# Patient Record
Sex: Female | Born: 1940 | Race: White | Hispanic: No | Marital: Married | Smoking: Never smoker
Health system: Southern US, Community
[De-identification: ages and names within clinical notes are randomized; demographics above are authoritative.]

## PROBLEM LIST (undated history)

## (undated) DIAGNOSIS — I6529 Occlusion and stenosis of unspecified carotid artery: Secondary | ICD-10-CM

## (undated) DIAGNOSIS — S62109A Fracture of unspecified carpal bone, unspecified wrist, initial encounter for closed fracture: Secondary | ICD-10-CM

## (undated) DIAGNOSIS — R001 Bradycardia, unspecified: Secondary | ICD-10-CM

## (undated) DIAGNOSIS — Z8679 Personal history of other diseases of the circulatory system: Secondary | ICD-10-CM

## (undated) DIAGNOSIS — N189 Chronic kidney disease, unspecified: Secondary | ICD-10-CM

## (undated) DIAGNOSIS — S82899A Other fracture of unspecified lower leg, initial encounter for closed fracture: Secondary | ICD-10-CM

## (undated) DIAGNOSIS — K2970 Gastritis, unspecified, without bleeding: Secondary | ICD-10-CM

## (undated) DIAGNOSIS — I639 Cerebral infarction, unspecified: Secondary | ICD-10-CM

## (undated) DIAGNOSIS — K219 Gastro-esophageal reflux disease without esophagitis: Secondary | ICD-10-CM

## (undated) DIAGNOSIS — R0989 Other specified symptoms and signs involving the circulatory and respiratory systems: Secondary | ICD-10-CM

## (undated) DIAGNOSIS — G629 Polyneuropathy, unspecified: Secondary | ICD-10-CM

## (undated) DIAGNOSIS — I2699 Other pulmonary embolism without acute cor pulmonale: Secondary | ICD-10-CM

## (undated) DIAGNOSIS — Z9225 Personal history of immunosupression therapy: Secondary | ICD-10-CM

## (undated) DIAGNOSIS — S02402A Zygomatic fracture, unspecified, initial encounter for closed fracture: Secondary | ICD-10-CM

## (undated) DIAGNOSIS — K529 Noninfective gastroenteritis and colitis, unspecified: Secondary | ICD-10-CM

## (undated) DIAGNOSIS — E785 Hyperlipidemia, unspecified: Secondary | ICD-10-CM

## (undated) DIAGNOSIS — S32040A Wedge compression fracture of fourth lumbar vertebra, initial encounter for closed fracture: Secondary | ICD-10-CM

## (undated) DIAGNOSIS — I4891 Unspecified atrial fibrillation: Secondary | ICD-10-CM

## (undated) DIAGNOSIS — D369 Benign neoplasm, unspecified site: Secondary | ICD-10-CM

## (undated) DIAGNOSIS — L97909 Non-pressure chronic ulcer of unspecified part of unspecified lower leg with unspecified severity: Secondary | ICD-10-CM

## (undated) DIAGNOSIS — S069X9A Unspecified intracranial injury with loss of consciousness of unspecified duration, initial encounter: Secondary | ICD-10-CM

## (undated) DIAGNOSIS — B3781 Candidal esophagitis: Secondary | ICD-10-CM

## (undated) DIAGNOSIS — K589 Irritable bowel syndrome without diarrhea: Secondary | ICD-10-CM

## (undated) DIAGNOSIS — N289 Disorder of kidney and ureter, unspecified: Secondary | ICD-10-CM

## (undated) DIAGNOSIS — N250 Renal osteodystrophy: Secondary | ICD-10-CM

## (undated) DIAGNOSIS — E039 Hypothyroidism, unspecified: Secondary | ICD-10-CM

## (undated) DIAGNOSIS — I2510 Atherosclerotic heart disease of native coronary artery without angina pectoris: Secondary | ICD-10-CM

## (undated) DIAGNOSIS — J189 Pneumonia, unspecified organism: Secondary | ICD-10-CM

## (undated) DIAGNOSIS — K559 Vascular disorder of intestine, unspecified: Secondary | ICD-10-CM

## (undated) DIAGNOSIS — I739 Peripheral vascular disease, unspecified: Secondary | ICD-10-CM

## (undated) DIAGNOSIS — M792 Neuralgia and neuritis, unspecified: Secondary | ICD-10-CM

## (undated) DIAGNOSIS — C439 Malignant melanoma of skin, unspecified: Secondary | ICD-10-CM

## (undated) DIAGNOSIS — K862 Cyst of pancreas: Secondary | ICD-10-CM

## (undated) DIAGNOSIS — K551 Chronic vascular disorders of intestine: Principal | ICD-10-CM

## (undated) DIAGNOSIS — S0240CA Maxillary fracture, right side, initial encounter for closed fracture: Secondary | ICD-10-CM

## (undated) DIAGNOSIS — I701 Atherosclerosis of renal artery: Secondary | ICD-10-CM

## (undated) DIAGNOSIS — E739 Lactose intolerance, unspecified: Secondary | ICD-10-CM

## (undated) DIAGNOSIS — J111 Influenza due to unidentified influenza virus with other respiratory manifestations: Secondary | ICD-10-CM

## (undated) DIAGNOSIS — D649 Anemia, unspecified: Secondary | ICD-10-CM

## (undated) DIAGNOSIS — S0285XA Fracture of orbit, unspecified, initial encounter for closed fracture: Secondary | ICD-10-CM

## (undated) HISTORY — PX: THYROID SURGERY: SHX805

## (undated) HISTORY — DX: Other specified symptoms and signs involving the circulatory and respiratory systems: R09.89

## (undated) HISTORY — DX: Anemia, unspecified: D64.9

## (undated) HISTORY — DX: Benign neoplasm, unspecified site: D36.9

## (undated) HISTORY — DX: Neuralgia and neuritis, unspecified: M79.2

## (undated) HISTORY — DX: Zygomatic fracture, unspecified side, initial encounter for closed fracture: S02.402A

## (undated) HISTORY — DX: Maxillary fracture, right side, initial encounter for closed fracture: S02.40CA

## (undated) HISTORY — DX: Fracture of unspecified carpal bone, unspecified wrist, initial encounter for closed fracture: S62.109A

## (undated) HISTORY — PX: CHOLECYSTECTOMY: SHX55

## (undated) HISTORY — DX: Hypothyroidism, unspecified: E03.9

## (undated) HISTORY — DX: Polyneuropathy, unspecified: G62.9

## (undated) HISTORY — DX: Malignant melanoma of skin, unspecified: C43.9

## (undated) HISTORY — DX: Peripheral vascular disease, unspecified: I73.9

## (undated) HISTORY — DX: Vascular disorder of intestine, unspecified: K55.9

## (undated) HISTORY — DX: Cerebral infarction, unspecified: I63.9

## (undated) HISTORY — DX: Non-pressure chronic ulcer of unspecified part of unspecified lower leg with unspecified severity: L97.909

## (undated) HISTORY — PX: VITRECTOMY: SHX106

## (undated) HISTORY — DX: Unspecified intracranial injury with loss of consciousness of unspecified duration, initial encounter: S06.9X9A

## (undated) HISTORY — DX: Gastro-esophageal reflux disease without esophagitis: K21.9

## (undated) HISTORY — DX: Chronic kidney disease, unspecified: N18.9

## (undated) HISTORY — PX: APPENDECTOMY: SHX54

## (undated) HISTORY — DX: Lactose intolerance, unspecified: E73.9

## (undated) HISTORY — DX: Renal osteodystrophy: N25.0

## (undated) HISTORY — PX: TONSILLECTOMY: SUR1361

## (undated) HISTORY — PX: THORACENTESIS: SHX235

## (undated) HISTORY — DX: Cyst of pancreas: K86.2

## (undated) HISTORY — DX: Disorder of kidney and ureter, unspecified: N28.9

## (undated) HISTORY — DX: Occlusion and stenosis of unspecified carotid artery: I65.29

## (undated) HISTORY — PX: CATARACT EXTRACTION: SUR2

## (undated) HISTORY — DX: Fracture of orbit, unspecified, initial encounter for closed fracture: S02.85XA

## (undated) HISTORY — DX: Atherosclerosis of renal artery: I70.1

## (undated) HISTORY — DX: Gastritis, unspecified, without bleeding: K29.70

## (undated) HISTORY — PX: ABDOMINAL HYSTERECTOMY: SHX81

## (undated) HISTORY — DX: Bradycardia, unspecified: R00.1

## (undated) HISTORY — PX: CORONARY ARTERY BYPASS GRAFT: SHX141

## (undated) HISTORY — PX: EYE SURGERY: SHX253

## (undated) HISTORY — DX: Personal history of immunosuppression therapy: Z92.25

## (undated) HISTORY — DX: Chronic vascular disorders of intestine: K55.1

## (undated) HISTORY — PX: BELOW KNEE LEG AMPUTATION: SUR23

## (undated) HISTORY — DX: Wedge compression fracture of fourth lumbar vertebra, initial encounter for closed fracture: S32.040A

## (undated) HISTORY — DX: Hyperlipidemia, unspecified: E78.5

## (undated) HISTORY — DX: Noninfective gastroenteritis and colitis, unspecified: K52.9

## (undated) HISTORY — DX: Atherosclerotic heart disease of native coronary artery without angina pectoris: I25.10

## (undated) HISTORY — DX: Personal history of other diseases of the circulatory system: Z86.79

## (undated) HISTORY — DX: Candidal esophagitis: B37.81

## (undated) HISTORY — DX: Irritable bowel syndrome, unspecified: K58.9

## (undated) HISTORY — DX: Other fracture of unspecified lower leg, initial encounter for closed fracture: S82.899A

## (undated) HISTORY — DX: Other pulmonary embolism without acute cor pulmonale: I26.99

---

## 2000-08-15 HISTORY — PX: KIDNEY TRANSPLANT: SHX239

## 2001-07-09 ENCOUNTER — Encounter: Payer: Self-pay | Admitting: Internal Medicine

## 2004-06-28 ENCOUNTER — Encounter: Payer: Self-pay | Admitting: Internal Medicine

## 2004-07-29 ENCOUNTER — Encounter: Payer: Self-pay | Admitting: Internal Medicine

## 2007-03-14 ENCOUNTER — Emergency Department (HOSPITAL_COMMUNITY): Admission: EM | Admit: 2007-03-14 | Discharge: 2007-03-14 | Payer: Self-pay | Admitting: Emergency Medicine

## 2008-02-11 ENCOUNTER — Ambulatory Visit: Payer: Self-pay | Admitting: Internal Medicine

## 2008-02-26 ENCOUNTER — Encounter: Payer: Self-pay | Admitting: Internal Medicine

## 2008-03-31 ENCOUNTER — Encounter: Payer: Self-pay | Admitting: Internal Medicine

## 2008-07-03 ENCOUNTER — Encounter: Payer: Self-pay | Admitting: Internal Medicine

## 2008-07-10 ENCOUNTER — Encounter: Payer: Self-pay | Admitting: Internal Medicine

## 2008-07-16 ENCOUNTER — Ambulatory Visit: Payer: Self-pay

## 2008-07-16 ENCOUNTER — Encounter: Payer: Self-pay | Admitting: Internal Medicine

## 2008-07-24 ENCOUNTER — Ambulatory Visit: Payer: Self-pay | Admitting: Internal Medicine

## 2008-08-18 ENCOUNTER — Ambulatory Visit: Payer: Self-pay | Admitting: Internal Medicine

## 2008-08-18 DIAGNOSIS — K589 Irritable bowel syndrome without diarrhea: Secondary | ICD-10-CM | POA: Insufficient documentation

## 2008-08-18 DIAGNOSIS — Z860100 Personal history of colon polyps, unspecified: Secondary | ICD-10-CM | POA: Insufficient documentation

## 2008-08-18 DIAGNOSIS — D136 Benign neoplasm of pancreas: Secondary | ICD-10-CM

## 2008-08-19 ENCOUNTER — Telehealth: Payer: Self-pay | Admitting: Internal Medicine

## 2008-09-04 ENCOUNTER — Encounter: Payer: Self-pay | Admitting: Internal Medicine

## 2008-09-11 ENCOUNTER — Ambulatory Visit: Payer: Self-pay | Admitting: Internal Medicine

## 2008-10-14 ENCOUNTER — Encounter: Payer: Self-pay | Admitting: Internal Medicine

## 2008-10-29 ENCOUNTER — Encounter: Payer: Medicare Other | Admitting: Internal Medicine

## 2008-11-14 ENCOUNTER — Encounter: Payer: Self-pay | Admitting: Internal Medicine

## 2008-11-20 ENCOUNTER — Encounter: Payer: Medicare Other | Admitting: Internal Medicine

## 2008-11-25 ENCOUNTER — Encounter (INDEPENDENT_AMBULATORY_CARE_PROVIDER_SITE_OTHER): Payer: Self-pay | Admitting: *Deleted

## 2008-12-20 ENCOUNTER — Encounter: Payer: Medicare Other | Admitting: Internal Medicine

## 2009-01-06 ENCOUNTER — Encounter: Payer: Self-pay | Admitting: Internal Medicine

## 2009-01-09 ENCOUNTER — Encounter: Payer: Self-pay | Admitting: Internal Medicine

## 2009-01-14 ENCOUNTER — Encounter: Payer: Self-pay | Admitting: Internal Medicine

## 2009-01-20 ENCOUNTER — Encounter: Payer: Medicare Other | Admitting: Internal Medicine

## 2009-02-09 ENCOUNTER — Ambulatory Visit: Payer: Self-pay | Admitting: Internal Medicine

## 2009-02-09 ENCOUNTER — Encounter: Payer: Self-pay | Admitting: Internal Medicine

## 2009-02-09 DIAGNOSIS — I10 Essential (primary) hypertension: Secondary | ICD-10-CM

## 2009-02-09 DIAGNOSIS — R0989 Other specified symptoms and signs involving the circulatory and respiratory systems: Secondary | ICD-10-CM

## 2009-02-09 DIAGNOSIS — I2510 Atherosclerotic heart disease of native coronary artery without angina pectoris: Secondary | ICD-10-CM

## 2009-02-11 ENCOUNTER — Ambulatory Visit: Payer: Self-pay | Admitting: Internal Medicine

## 2009-02-11 DIAGNOSIS — E1122 Type 2 diabetes mellitus with diabetic chronic kidney disease: Secondary | ICD-10-CM | POA: Insufficient documentation

## 2009-02-11 DIAGNOSIS — N184 Chronic kidney disease, stage 4 (severe): Secondary | ICD-10-CM

## 2009-02-18 ENCOUNTER — Ambulatory Visit: Payer: Self-pay | Admitting: Internal Medicine

## 2009-02-18 ENCOUNTER — Encounter: Payer: Self-pay | Admitting: Internal Medicine

## 2009-02-18 HISTORY — PX: COLONOSCOPY W/ BIOPSIES AND POLYPECTOMY: SHX1376

## 2009-02-19 ENCOUNTER — Encounter: Payer: Self-pay | Admitting: Internal Medicine

## 2009-03-31 ENCOUNTER — Encounter: Payer: Self-pay | Admitting: Internal Medicine

## 2009-04-01 ENCOUNTER — Encounter: Payer: Self-pay | Admitting: Internal Medicine

## 2009-04-07 ENCOUNTER — Encounter: Payer: Self-pay | Admitting: Internal Medicine

## 2009-04-07 ENCOUNTER — Ambulatory Visit: Payer: Self-pay

## 2009-04-08 ENCOUNTER — Encounter: Payer: Self-pay | Admitting: Internal Medicine

## 2009-05-20 ENCOUNTER — Ambulatory Visit: Payer: Self-pay | Admitting: Internal Medicine

## 2009-05-20 DIAGNOSIS — R42 Dizziness and giddiness: Secondary | ICD-10-CM

## 2009-07-29 ENCOUNTER — Encounter: Payer: Self-pay | Admitting: Cardiovascular Disease

## 2009-07-29 ENCOUNTER — Ambulatory Visit: Payer: Self-pay | Admitting: Internal Medicine

## 2009-07-29 DIAGNOSIS — R55 Syncope and collapse: Secondary | ICD-10-CM | POA: Insufficient documentation

## 2009-07-29 LAB — CONVERTED CEMR LAB
ALT: 30 units/L
AST: 24 units/L
Alkaline Phosphatase: 62 units/L
CO2: 29 meq/L
GFR calc non Af Amer: >59 mL/min
Glomerular Filtration Rate, Af Am: >59 mL/min/{1.73_m2}
Sodium: 141 meq/L

## 2009-08-03 ENCOUNTER — Encounter: Payer: Self-pay | Admitting: Internal Medicine

## 2009-08-09 ENCOUNTER — Encounter: Payer: Self-pay | Admitting: Internal Medicine

## 2009-08-12 ENCOUNTER — Encounter (INDEPENDENT_AMBULATORY_CARE_PROVIDER_SITE_OTHER): Payer: Self-pay | Admitting: *Deleted

## 2009-08-25 ENCOUNTER — Telehealth: Payer: Self-pay | Admitting: Internal Medicine

## 2009-09-09 ENCOUNTER — Ambulatory Visit: Payer: Self-pay | Admitting: Internal Medicine

## 2009-09-16 ENCOUNTER — Telehealth: Payer: Self-pay | Admitting: Internal Medicine

## 2009-09-25 ENCOUNTER — Encounter: Payer: Self-pay | Admitting: Cardiovascular Disease

## 2009-10-01 ENCOUNTER — Encounter: Payer: Self-pay | Admitting: Internal Medicine

## 2009-11-24 ENCOUNTER — Encounter: Payer: Self-pay | Admitting: Internal Medicine

## 2009-11-25 ENCOUNTER — Encounter: Payer: Self-pay | Admitting: Internal Medicine

## 2009-11-25 ENCOUNTER — Telehealth: Payer: Self-pay | Admitting: Internal Medicine

## 2009-11-30 ENCOUNTER — Ambulatory Visit: Payer: Self-pay | Admitting: Internal Medicine

## 2009-11-30 ENCOUNTER — Encounter (INDEPENDENT_AMBULATORY_CARE_PROVIDER_SITE_OTHER): Payer: Self-pay | Admitting: *Deleted

## 2009-12-01 ENCOUNTER — Ambulatory Visit: Payer: Self-pay | Admitting: Internal Medicine

## 2009-12-01 HISTORY — PX: UPPER GASTROINTESTINAL ENDOSCOPY: SHX188

## 2009-12-08 ENCOUNTER — Telehealth: Payer: Self-pay | Admitting: Internal Medicine

## 2009-12-08 DIAGNOSIS — K219 Gastro-esophageal reflux disease without esophagitis: Secondary | ICD-10-CM | POA: Insufficient documentation

## 2009-12-10 ENCOUNTER — Telehealth: Payer: Self-pay | Admitting: Internal Medicine

## 2009-12-14 ENCOUNTER — Ambulatory Visit: Payer: Self-pay | Admitting: Internal Medicine

## 2009-12-14 DIAGNOSIS — I495 Sick sinus syndrome: Secondary | ICD-10-CM

## 2009-12-15 ENCOUNTER — Ambulatory Visit (HOSPITAL_COMMUNITY): Admission: RE | Admit: 2009-12-15 | Discharge: 2009-12-15 | Payer: Self-pay | Admitting: Internal Medicine

## 2009-12-30 ENCOUNTER — Telehealth: Payer: Self-pay | Admitting: Internal Medicine

## 2010-01-14 ENCOUNTER — Ambulatory Visit: Payer: Self-pay | Admitting: Internal Medicine

## 2010-01-15 ENCOUNTER — Telehealth: Payer: Self-pay | Admitting: Internal Medicine

## 2010-01-25 ENCOUNTER — Telehealth (INDEPENDENT_AMBULATORY_CARE_PROVIDER_SITE_OTHER): Payer: Self-pay | Admitting: *Deleted

## 2010-01-25 ENCOUNTER — Encounter (INDEPENDENT_AMBULATORY_CARE_PROVIDER_SITE_OTHER): Payer: Self-pay | Admitting: *Deleted

## 2010-01-26 ENCOUNTER — Encounter: Payer: Self-pay | Admitting: Internal Medicine

## 2010-02-01 ENCOUNTER — Telehealth: Payer: Self-pay | Admitting: Gastroenterology

## 2010-02-04 ENCOUNTER — Ambulatory Visit: Payer: Self-pay | Admitting: Gastroenterology

## 2010-02-04 ENCOUNTER — Ambulatory Visit (HOSPITAL_COMMUNITY): Admission: RE | Admit: 2010-02-04 | Discharge: 2010-02-04 | Payer: Self-pay | Admitting: Gastroenterology

## 2010-02-04 HISTORY — PX: EUS: SHX5427

## 2010-02-05 ENCOUNTER — Encounter: Payer: Self-pay | Admitting: Gastroenterology

## 2010-02-05 ENCOUNTER — Telehealth: Payer: Self-pay | Admitting: Gastroenterology

## 2010-02-16 ENCOUNTER — Telehealth: Payer: Self-pay | Admitting: Internal Medicine

## 2010-02-18 ENCOUNTER — Ambulatory Visit: Payer: Self-pay | Admitting: Internal Medicine

## 2010-02-23 ENCOUNTER — Telehealth: Payer: Self-pay | Admitting: Internal Medicine

## 2010-03-29 ENCOUNTER — Encounter: Payer: Self-pay | Admitting: Internal Medicine

## 2010-03-31 ENCOUNTER — Encounter: Payer: Self-pay | Admitting: Internal Medicine

## 2010-04-01 ENCOUNTER — Telehealth: Payer: Self-pay | Admitting: Internal Medicine

## 2010-04-08 ENCOUNTER — Encounter: Payer: Self-pay | Admitting: Internal Medicine

## 2010-04-15 ENCOUNTER — Ambulatory Visit: Payer: Self-pay | Admitting: Family Medicine

## 2010-05-24 ENCOUNTER — Ambulatory Visit: Payer: Self-pay | Admitting: Gastroenterology

## 2010-05-24 ENCOUNTER — Inpatient Hospital Stay (HOSPITAL_COMMUNITY): Admission: AD | Admit: 2010-05-24 | Discharge: 2010-05-27 | Payer: Self-pay | Admitting: Nephrology

## 2010-05-24 ENCOUNTER — Ambulatory Visit: Payer: Self-pay | Admitting: Cardiovascular Disease

## 2010-05-26 ENCOUNTER — Encounter: Payer: Self-pay | Admitting: Internal Medicine

## 2010-05-26 ENCOUNTER — Telehealth: Payer: Self-pay | Admitting: Internal Medicine

## 2010-05-26 ENCOUNTER — Encounter (INDEPENDENT_AMBULATORY_CARE_PROVIDER_SITE_OTHER): Payer: Self-pay | Admitting: Nephrology

## 2010-05-27 ENCOUNTER — Encounter: Payer: Self-pay | Admitting: Internal Medicine

## 2010-06-14 ENCOUNTER — Telehealth: Payer: Self-pay | Admitting: Internal Medicine

## 2010-07-04 ENCOUNTER — Ambulatory Visit (HOSPITAL_COMMUNITY): Admission: RE | Admit: 2010-07-04 | Discharge: 2010-07-04 | Payer: Self-pay | Admitting: Internal Medicine

## 2010-07-06 ENCOUNTER — Telehealth: Payer: Self-pay | Admitting: Internal Medicine

## 2010-07-09 ENCOUNTER — Ambulatory Visit: Payer: Self-pay | Admitting: Internal Medicine

## 2010-07-19 ENCOUNTER — Encounter: Payer: Self-pay | Admitting: Internal Medicine

## 2010-07-28 ENCOUNTER — Telehealth: Payer: Self-pay | Admitting: Internal Medicine

## 2010-08-25 ENCOUNTER — Encounter: Payer: Medicare Other | Admitting: Orthopedic Surgery

## 2010-09-22 ENCOUNTER — Encounter: Payer: Medicare Other | Admitting: Orthopedic Surgery

## 2010-09-22 NOTE — Assessment & Plan Note (Signed)
Summary: follow up EGD/lk   History of Present Illness Visit Type: Follow-up Visit Primary GI MD: Stan Head MD [facility] Primary Provider: Terrial Rhodes, MD Requesting Provider: n/a Chief Complaint: EGD & MRI ; chest pain has improved History of Present Illness:   70 yo white woman s/p renal transplant with GERD and erosive gastritis found at EGD 12/02/09. Despite her reluctance to taking Dexilant 30 mg daily, she staretd it and is much better. She remains concerned about Dexilant and possible osteoprosis and changes in Prograf levels that are possible. has not had another Prograf level She also had an MRI showing slight increase in size of cystic lesion of the pancreas known to exist since at least 2005 when she had it evaluated in [place]. overall better but still has cramps and diarrhea which responds to 1/2 Lomotil intermitently 1x/week 9 episodes of chest discomfort while eating and a few episodes of nausea and vomiting in past month    GI Review of Systems    Reports chest pain.      Denies abdominal pain, acid reflux, belching, bloating, dysphagia with liquids, dysphagia with solids, heartburn, loss of appetite, nausea, vomiting, vomiting blood, weight loss, and  weight gain.        Denies anal fissure, black tarry stools, change in bowel habit, constipation, diarrhea, diverticulosis, fecal incontinence, heme positive stool, hemorrhoids, irritable bowel syndrome, jaundice, light color stool, liver problems, rectal bleeding, and  rectal pain. MRI of Abdomen  Procedure date:  12/15/2009  Findings:       1.  Multilobulated cystic lesion with the genu of the pancreas 2.2x3.1x2.8 cm which communicates with the pancreatic duct with mild ductal dilatation. Differential includes side branch intrapapillary mucinous neoplasm, sequelae of prior pancreatitis, or small mucinous cystic neoplasm.  No specific malignant features.  Favor IMPN. Consider ERCP for sampling of the  pancreatic duct. 2.  Dilatation of the common bile duct likely related to pancreatic process. 3.  Mild intrahepatic biliary ductal dilatation 4.  Evidence for  secondary hemochromatosis     Current Medications (verified): 1)  Prograf 1 Mg Caps (Tacrolimus) .... Take 2 Tablets By Mouth Two Times A Day 2)  Prednisone 5 Mg Tabs (Prednisone) .... Take 1 Tablet By Mouth Once A Day 3)  Synthroid 150 Mcg Tabs (Levothyroxine Sodium) .... Take 1 Tablet By Mouth Once A Day in Am 4)  Lipitor 10 Mg Tabs (Atorvastatin Calcium) .... Take 1 Tablet By Mouth Once A Day On Mon, Wed, Fri, Sun 5)  Lantus For Opticlik 100 Unit/ml Soln (Insulin Glargine) .... 30 Units Once Daily 6)  Multivitamins  Tabs (Multiple Vitamin) .... Take 1/2 Tablet By Mouth in Morning, and 1/2 Tablet By Mouth in Evening 7)  Caltrate 600+d 600-400 Mg-Unit Tabs (Calcium Carbonate-Vitamin D) .... Take 2 Tablets By Mouth Once A Day 8)  Myfortic 360 Mg Tbec (Mycophenolate Sodium) .Marland Kitchen.. 1 By Mouth Bid 9)  Humalog 100 Unit/ml Soln (Insulin Lispro (Human)) .... As Directed Sliding Scale 10)  Gelnique 10 % Gel (Oxybutynin Chloride) .... Once Daily As Directed 11)  Aspirin 81 Mg Tbec (Aspirin) .... Take One Tablet By Mouth Two Times A Day 12)  Dexilant 30 Mg Cpdr (Dexlansoprazole) .... Take 1 Tablet By Mouth Once Daily 13)  Antivert 50 Mg Tabs (Meclizine Hcl) .... Once Daily  Allergies (verified): No Known Drug Allergies  Past History:  Past Medical History: End-Stage Renal Disease s/p kidney transplant Peripheral Artery Disease s/p L BKA Osteoporosis Coronary Artery Disease  Diabetes Hyperlipidemia  Hypertension Hypothyroidism Anemia Melanoma L cartoid bruit   -u/s 11/09: normal[ Adenoma colon 2010 Cystic mass body of pancreas 2005 CT/MR/EUS, follow-up CT without clear cyst, ? chronic pancreatitis GERD and gastritis 11/2009  Past Surgical History: Reviewed history from 08/18/2008 and no changes required. Kidney Transplant  2001 Below-Knee amputation-left Appendectomy CABG Cataract Extraction Cholecystectomy Hysterectomy Thyroid Surgery Tonsillectomy C- section x2 Vitrecomy rt eye  Family History: Reviewed history from 02/11/2009 and no changes required. Family History of Heart Disease: Mother No FH of Colon Cancer:  Social History: Reviewed history from 08/18/2008 and no changes required. 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  Alcohol Use - no  Daily Caffeine Use -1 Illicit Drug Use - no  Vital Signs:  Patient profile:   70 year old female Height:      63 inches Weight:      159 pounds BMI:     28.27 Pulse rate:   60 / minute Pulse rhythm:   regular BP sitting:   138 / 68  (left arm) Cuff size:   regular  Vitals Entered By: June McMurray CMA Duncan Dull) (Jan 14, 2010 11:29 AM)  Physical Exam  General:  Well developed, well nourished, no acute distress.   Impression & Recommendations:  Problem # 1:  GERD (ICD-530.81) Assessment Improved EGD showed reflux esophagitis in april 2011. she should take the PPI for at least a few months. She will be having Prograf levels and hopefully that will reassure her. ?/ if gastric emptying has become delayed leading to the GERD.  Problem # 2:  DYSPHAGIA UNSPECIFIED (ICD-787.20) Assessment: Improved Has resolved with treatment (PPI) of GERD.  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 IPMN, cystic neoplasm or related to previous pancreatitis (not known to be) 1.2 x 2.4 cm in 2005 Imaging and EUS FNA was benign in 2005 [facility] [place]) including cytology and amylase and CEA levels Think conservative approach best in her will discuss further evaluation with our endosonographer, Dr. Christella Hartigan.  Patient Instructions: 1)  Please pick up your medications at your pharmacy.  2)  We have sent a 90-day prescription to [facility]. 3)  Copy sent to : Terrial Rhodes, MD 4)  The medication list was  reviewed and reconciled.  All changed / newly prescribed medications were explained.  A complete medication list was provided to the patient / caregiver. Prescriptions: DEXILANT 30 MG CPDR (DEXLANSOPRAZOLE) Take 1 tablet by mouth once daily  #90 x 3   Entered by:   Francee Piccolo CMA (AAMA)   Authorized by:   Iva Boop MD, [facility]   Signed by:   Francee Piccolo CMA (AAMA) on 01/14/2010   Method used:   Electronically to        [facility]* (mail-order)             ,          Ph: [phone]       Fax: [phone]   RxID:   [id number] DEXILANT 30 MG CPDR (DEXLANSOPRAZOLE) Take 1 tablet by mouth once daily  #30 x 11   Entered and Authorized by:   Iva Boop MD, [facility]   Signed by:   Iva Boop MD, FACG on 01/14/2010   Method used:   Electronically to        [facility]  [address] [facility] (retail)       [address]       [place], [place]  [address]  Ph: [phone] or [phone]       Fax: [phone]   RxID:   [id number]   Appended Document: follow up EGD/lk reviewed records, MRI.  Upper eus to repeat FNA of the cystic lesion is reasonable.    Patty, can you set this up to be done at next available EUS thursday. 60 min, radial +/- linear, dx: pancreatic cyst  Appended Document: follow up EGD/lk EUS scheduled

## 2010-09-22 NOTE — Assessment & Plan Note (Signed)
Summary: f51m   Referring Provider:  n/a Primary Provider:  Terrial Rhodes, MD  CC:  ROV; No complaints.  History of Present Illness: Elizabeth [name] is a delightful 70 year old woman with a history of end-stage renal disease secondary to diabetic nephropathy. She is status post kidney transplant in December 2001 with normal creatinine.  She also has diabetes complicated by retinopathy, neuropathy, and nephropathy. Remainder of history is notable for coronary artery disease status post 1-vessel bypass of her circumflex in 2001, orthostatic hypotension, peripheral vascular disease status post left BKA in the setting of a Charcot joint.   She had echo in August with normal EF and mild AI.   We have been following her for her CAD and orthostatic hypotension. Returns today for routine f/u.   We previously treated her with midodrine for severe orthostasis. Wore an ambulatory BP monitor in January which showed labile BPs. Overall SBP was in 150-200. She felt midodrine was making her worse so she stopped. Started taking meclizine 50 once daily and dizziness completely resolved.  SBP running 110-130 at home and also at [employer] exercises classes. No real high readings since stopping midodrine. Denies any CP or SOB.  No edema.   Current Medications (verified): 1)  Prograf 1 Mg Caps (Tacrolimus) .... Take 2 Tablets By Mouth Two Times A Day 2)  Prednisone 5 Mg Tabs (Prednisone) .... Take 1 Tablet By Mouth Once A Day 3)  Synthroid 150 Mcg Tabs (Levothyroxine Sodium) .... Take 1 Tablet By Mouth Once A Day in Am 4)  Lipitor 10 Mg Tabs (Atorvastatin Calcium) .... Take 1 Tablet By Mouth Once A Day On Mon, Wed, Fri, Sun 5)  Lantus For Opticlik 100 Unit/ml Soln (Insulin Glargine) .... 30 Units Once Daily 6)  Multivitamins  Tabs (Multiple Vitamin) .... Take 1/2 Tablet By Mouth in Morning, and 1/2 Tablet By Mouth in Evening 7)  Caltrate 600+d 600-400 Mg-Unit Tabs (Calcium Carbonate-Vitamin D) .... Take 2 Tablets  By Mouth Once A Day 8)  Myfortic 360 Mg Tbec (Mycophenolate Sodium) .Marland Kitchen.. 1 By Mouth Bid 9)  Humalog 100 Unit/ml Soln (Insulin Lispro (Human)) .... As Directed Sliding Scale 10)  Gelnique 10 % Gel (Oxybutynin Chloride) .... Once Daily As Directed 11)  Aspirin 81 Mg Tbec (Aspirin) .... Take One Tablet By Mouth Two Times A Day  Allergies (verified): No Known Drug Allergies  Past History:  Past Medical History: Last updated: 11/30/2009 End-Stage Renal Disease s/p kidney transplant Peripheral Artery Disease s/p L BKA Osteoporosis Coronary Artery Disease  Diabetes Hyperlipidemia Hypertension Hypothyroidism Anemia Melanoma L cartoid bruit   -u/s 11/09: normal[ Adenoma colon 2010 Cystic mass body of pancreas 2005 CT/MR/EUS, follow-up CT without clear cyst, ? chronic pancreatitis  Vital Signs:  Patient profile:   70 year old female Height:      63 inches Weight:      158 pounds BMI:     28.09 Pulse rate:   49 / minute BP sitting:   178 / 68  (right arm)  Vitals Entered By: Stanton Kidney, EMT-P (December 14, 2009 3:17 PM)  Physical Exam  General:  Well appearing. no resp difficulty HEENT: normal Neck: supple. no JVD. Carotids 2+ bilat; with bilat radiated bruits. No lymphadenopathy or thryomegaly appreciated. Cor: PMI nondisplaced. Regular rate & rhythm. No rubs, gallops 2/6 SEM at RSB s2 well preserved. Lungs: clear Abdomen: soft, nontender, nondistended. No hepatosplenomegaly. No bruits or masses. Good bowel sounds. Extremities: no cyanosis, clubbing. s/p L bka. R leg in brace.  no edema Neuro: alert & orientedx3, cranial nerves grossly intact. moves all 4 extremities w/o difficulty. affect pleasant     Impression & Recommendations:  Problem # 1:  HYPERTENSION, BENIGN (ICD-401.1) Much improved. No further dizziness. Will continue current regimen.  Problem # 2:  CAD, NATIVE VESSEL (ICD-414.01) Stable. No evidence of ischemia. Continue current regimen.  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 Not on any AV nodal blockers. Will follow. No indication for pacing.

## 2010-09-22 NOTE — Progress Notes (Signed)
Summary: [facility] surgeon appt  Phone Note Outgoing Call   Summary of Call: Please let patient know I recommend she see either Dr. Milana Kidney or Graylin Shiver re: pancreatic lesion. If she is ok with that then make an appointment and I will create a letter to go with records. 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  She will see Dr Jacquenette Shone at [facility] 03/29/10 1:30.  Clinic [facility].  patient is aware of the appointment  she has made arrangements to have the radiology disk made.   Follow-up by: Darcey Nora RN, CGRN,  February 23, 2010 11:42 AM

## 2010-09-22 NOTE — Consult Note (Signed)
Summary: [facility]   [facility]   Imported By: Roderic Ovens 06/08/2010 15:06:44  _____________________________________________________________________  External Attachment:    Type:   Image     Comment:   External Document

## 2010-09-22 NOTE — Progress Notes (Signed)
Summary: call to pt  Phone Note Outgoing Call   Summary of Call: left message that I was trying to follow-up with her Iva Boop MD, [facility]  Dec 30, 2009 3:09 PM   Follow-up for Phone Call        Pt. returned your call. You may reach her @ [phone] Follow-up by: Karna Christmas,  Dec 30, 2009 3:20 PM  Additional Follow-up for Phone Call Additional follow up Details #1::        she is pain free on dexilant will see her in follow-up Iva Boop MD, [facility]  Jan 01, 2010 10:59 AM

## 2010-09-22 NOTE — Progress Notes (Signed)
Summary: Triage  Phone Note Call from Patient Call back at Home Phone [phone]   Caller: Patient Call For: Dr. Leone Payor Reason for Call: Talk to Nurse Summary of Call: pt was told by Dr. Jacquenette Shone at [facility] that she needs to have a repeat MRI six months after her last MRI Initial call taken by: Vallarie Mare,  April 01, 2010 10:45 AM  Follow-up for Phone Call        Last MRI done 12-15-09. She saw Dr.Pappas at [facility] on 03-29-10. She will need a follow-up MRI in October 2011. We will contact patient closer to that date to get MRI scheduled. Pt. instructed to call back as needed.   Follow-up by: Laureen Ochs LPN,  April 01, 2010 11:32 AM  Additional Follow-up for Phone Call Additional follow up Details #1::        ok Additional Follow-up by: Iva Boop MD, Clementeen Graham,  April 01, 2010 11:58 AM

## 2010-09-22 NOTE — Progress Notes (Signed)
Summary: [facility]  [facility]   Imported By: Harlon Flor 09/25/2009 11:33:45  _____________________________________________________________________  External Attachment:    Type:   Image     Comment:   External Document

## 2010-09-22 NOTE — Assessment & Plan Note (Signed)
Summary: f-up eus/pancreatic lession/yf   History of Present Illness Visit Type: Follow-up Visit Primary GI MD: Stan Head MD [facility] Primary Provider: Terrial Rhodes, MD Requesting Provider: n/a Chief Complaint: pancreatic lesion History of Present Illness:   70 yo white woman with ESRD s/p kidney transplant and a chronic cystic lesion of the pancreas. Based upon recent EUS and FNA, this appears to be a main duct intraductal papillary mucinous neoplasm. She and her husband are here to discuss this diagnosis, prognosis, and potential treatment.  Her upper abdominal pain is controlled by Dexilant 30 mg daily.   GI Review of Systems      Denies abdominal pain, acid reflux, belching, bloating, chest pain, dysphagia with liquids, dysphagia with solids, heartburn, loss of appetite, nausea, vomiting, vomiting blood, weight loss, and  weight gain.        Denies anal fissure, black tarry stools, change in bowel habit, constipation, diarrhea, diverticulosis, fecal incontinence, heme positive stool, hemorrhoids, irritable bowel syndrome, jaundice, light color stool, liver problems, rectal bleeding, and  rectal pain. Clinical Reports Reviewed:  MRI of Abdomen:  12/15/2009:   1.  Multilobulated cystic lesion with the genu of the pancreas 2.2x3.1x2.8 cm which communicates with the pancreatic duct with mild ductal dilatation. Differential includes side branch intrapapillary mucinous neoplasm, sequelae of prior pancreatitis, or small mucinous cystic neoplasm.  No specific malignant features.  Favor IMPN. Consider ERCP for sampling of the pancreatic duct. 2.  Dilatation of the common bile duct likely related to pancreatic process. 3.  Mild intrahepatic biliary ductal dilatation 4.  Evidence for  secondary hemochromatosis  EUS  Procedure date:  02/04/2010  Findings:      1.  Anechoic (cystic) lesion in neck/body of pancreas that is     composed of multiple small cysts with thin septea.  The lesion     measures 3.4cm maximally and clearly communicates with the main     pancreatic duct.  There are no associated nodules or solid masses.     The cyst was sampled with a single pass of a 22 guage BS EUS FNA     Needle using color doppler to avoid significant nearby blood     vessels (portal vein).  This yielded 2cc of clear, visious fluid     that was sent for cytology and Red Path (CEA, amylase).     2. The pancreatic parenchyma was otherwise normal: no discrete     masses or signs of chronic pancreatitis.     3. Main pancreatic duct communicates with the cystic lesion noted     above but was otherwise normal.     4. CBC slightly dilated (up to 7.25mm) without stones, filling     defects.     5. Gallbladder surgically absent.     6. No peripancreatic adenopathy.           Impression:     The cyst in neck of pancreas has clearly grown since evaluation 6     years ago in [place]. It is 3.4cm maximally now, communicates     with the main pancreatic duct but otherwise has no concerning     features such as associated nodules or solid masses.  I suspect it     is a serous cystadenoma or perhaps IPMN (main duct).  Await final     fluid analysis (CEA, amylase, cytology).  Given multiple     comorbities (DM, significant PVD s/p BKA, renal tranplant     recipient) there  would need to be very strong evidence of     malignancy or malignant potential to recommend resection.     She will complete 3 days of cipro twice daily.        Comments:      CEA 4700 Amylase < 3 cytology - benign cells     Current Medications (verified): 1)  Prograf 1 Mg Caps (Tacrolimus) .... Take 2 Tablets By Mouth Two Times A Day 2)  Prednisone 5 Mg Tabs (Prednisone) .... Take 1 Tablet By Mouth Once A Day 3)  Synthroid 150 Mcg Tabs (Levothyroxine Sodium) .... Take 1 Tablet By Mouth Once A Day in Am 4)  Lipitor 10 Mg Tabs (Atorvastatin Calcium) .... Take 1 Tablet By Mouth Once A Day On Mon, Wed, Fri,  Sun 5)  Lantus For Opticlik 100 Unit/ml Soln (Insulin Glargine) .Marland Kitchen.. 10 Units Once Daily 6)  Multivitamins  Tabs (Multiple Vitamin) .... Take 1/2 Tablet By Mouth in Morning, and 1/2 Tablet By Mouth in Evening 7)  Caltrate 600+d 600-400 Mg-Unit Tabs (Calcium Carbonate-Vitamin D) .... Take 2 Tablets By Mouth Once A Day 8)  Myfortic 360 Mg Tbec (Mycophenolate Sodium) .Marland Kitchen.. 1 By Mouth Bid 9)  Humalog 100 Unit/ml Soln (Insulin Lispro (Human)) .... As Directed Sliding Scale 10)  Gelnique 10 % Gel (Oxybutynin Chloride) .... Once Daily As Directed 11)  Aspirin 81 Mg Tbec (Aspirin) .... Take One Tablet By Mouth Two Times A Day 12)  Dexilant 30 Mg Cpdr (Dexlansoprazole) .... Take 1 Tablet By Mouth Once Daily 13)  Antivert 50 Mg Tabs (Meclizine Hcl) .... 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 Family History of Heart Disease: Mother No FH of Colon Cancer:  Social History: Reviewed history from 08/18/2008 and no changes required. 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  Alcohol Use - no  Daily Caffeine Use -1 Illicit Drug Use - no  Vital Signs:  Patient profile:   70 year old female Height:      63 inches Weight:      160.25 pounds BMI:     28.49 Pulse rate:   60 / minute Pulse rhythm:   regular BP  sitting:   158 / 70  (right arm)  Vitals Entered By: Milford Cage NCMA (February 18, 2010 11:04 AM)  Physical Exam  General:  Well developed, well nourished, no acute distress.   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  MD-IPMN are precancerous lesions and if greater than 3cm, should be at least considered for resection (would be a Whipple).  She is not a great surgical candidate and she is aware of this fact. We have discussed the meaning of all  this and that could have cancer starting in the cyst but not detected and that there is a real risk of malignant transformation. Her comorbidities would make surgery more difficult and she could die from pancreatico-duodenectomy. The lesion has been present at least 6 years and has grown slowly. Immunosuppression increases risk of malignant transformation, most likely. We spent 25+ minutes today discussing and reviewing. Copies of pathology and CEA/amylase levels provided.  We have decided to refer to a tertiary center, [facility] was selevcted, to have expert evaluation from a pancreatic surgeon. They will have more insight and experience than we do and I believe will be able to offer better advice in this complex situation.  Problem # 2:  GERD (ICD-530.81) Assessment: Improved Doing well. She remains concerned about the use of Dexilant. Do not know if she needs long term. Have asked her to take it for 3 months then taper off and see.ppi x 3 mos then try to stop/taper  Patient Instructions: 1)  Obtain copies of the images of your last MRI and CT scan . 2)  We will obtain an appointment at [facility] re: pancreatic cyst. You  should hear something by next week (from [place]). 3)  Stay on Dexilant until the end of July and in early August stop it by taking it every other day. 4)  Copy sent to : Terrial Rhodes, MD 5)  The medication list was reviewed and reconciled.  All changed / newly prescribed medications were explained.  A complete medication list was provided to the patient / caregiver.

## 2010-09-22 NOTE — Progress Notes (Signed)
Summary: EUS  Phone Note Outgoing Call Call back at [facility] Phone [phone]   Call placed by: Chales Abrahams CMA Duncan Dull),  January 25, 2010 1:40 PM Summary of Call: pt scheduled for EUS reviewed  meds and instructed  pt Initial call taken by: Chales Abrahams CMA (AAMA),  January 25, 2010 1:40 PM  New Problems: NONSPECIFIC ABN FINDING RAD & OTH EXAM GI TRACT (ICD-793.4)   New Problems: NONSPECIFIC ABN FINDING RAD & OTH EXAM GI TRACT (ICD-793.4)

## 2010-09-22 NOTE — Progress Notes (Signed)
Summary: Needs order faxed  Phone Note Other Incoming   Caller: [facility] @ [facility] [phone] Summary of Call: Need orders faxed for MRI of the abd.... appt. on 12-15-09  Fax# [phone] Initial call taken by: Karna Christmas,  December 10, 2009 9:29 AM  Follow-up for Phone Call        order refaxed Follow-up by: Darcey Nora RN, CGRN,  December 10, 2009 9:50 AM

## 2010-09-22 NOTE — Consult Note (Signed)
Summary: Toy Cookey MD @ [facility] MD @ [facility]   Imported By: Lennie Odor 04/06/2010 10:52:52  _____________________________________________________________________  External Attachment:    Type:   Image     Comment:   External Document

## 2010-09-22 NOTE — Progress Notes (Signed)
Summary: PROBLEMS  Phone Note Call from Patient Call back at Home Phone [phone]   Caller: SELF Call For: Elizabeth [name] Summary of Call: PT IS FEELING DIZZY AND WHEN SHE LOOKS FROM SIDE TO SIDE HER HEAD SPINS-SHOULD SHE INCREASE OR DECREASE HER MEDICINE? Initial call taken by: Harlon Flor,  September 16, 2009 2:41 PM  Follow-up for Phone Call        pt did not stop midodrine as instructed to from phone note on 1/4.  Instructed pt to stop medication and call back on Friday to report how she is feeling.  Follow-up by: Charlena Cross, RN, BSN,  September 16, 2009 3:12 PM  Additional Follow-up for Phone Call Additional follow up Details #1::        pt states that she is feeling much better since stopped midodrine.  will call with further problems. Additional Follow-up by: Charlena Cross, RN, BSN,  September 21, 2009 4:24 PM

## 2010-09-22 NOTE — Letter (Signed)
Summary: [facility]  [facility]   Imported By: Lester Garibaldi 04/27/2010 12:18:55  _____________________________________________________________________  External Attachment:    Type:   Image     Comment:   External Document

## 2010-09-22 NOTE — Miscellaneous (Signed)
Summary: rx  Clinical Lists Changes  Medications: Added new medication of CIPROFLOXACIN HCL 500 MG  TABS (CIPROFLOXACIN HCL) Take 1 twice a day for 3 days - Signed Rx of CIPROFLOXACIN HCL 500 MG  TABS (CIPROFLOXACIN HCL) Take 1 twice a day for 3 days;  #6 x 0;  Signed;  Entered by: Rachael Fee MD;  Authorized by: Rachael Fee MD;  Method used: Print then Give to Patient    Prescriptions: CIPROFLOXACIN HCL 500 MG  TABS (CIPROFLOXACIN HCL) Take 1 twice a day for 3 days  #6 x 0   Entered and Authorized by:   Rachael Fee MD   Signed by:   Rachael Fee MD on 02/04/2010   Method used:   Print then Give to Patient   RxID:   [id number]

## 2010-09-22 NOTE — Letter (Signed)
Summary: Diabetic Instructions  [facility]  [address] [place], [place] [address]   Phone: [phone]  Fax: [phone]    Elizabeth [name] 11-19-1940 MRN: [id number]     ________________________________________________________________________  X  INSULIN (LONG ACTING) MEDICATION INSTRUCTIONS (Lantus, NPH, 70/30, Humulin, Novolin-N)   The day before your procedure:   Take  your regular evening dose    The day of your procedure:   Do not take your morning dose    X   INSULIN (SHORT ACTING) MEDICATION INSTRUCTIONS (Regular, Humulog, Novolog)   The day before your procedure:   Do not take your evening dose   The day of your procedure:   Do not take your morning dose

## 2010-09-22 NOTE — Letter (Signed)
Summary: [facility] Office Note  [facility] Office Note   Imported By: Roderic Ovens 12/23/2009 15:35:20  _____________________________________________________________________  External Attachment:    Type:   Image     Comment:   External Document

## 2010-09-22 NOTE — Progress Notes (Signed)
Summary: PROBLEMS  Phone Note Call from Patient Call back at Home Phone [phone]   Caller: SELF Call For: Jailynn Lavalais Summary of Call: DIZZINESS HAS GOTTEN WORSE SINCE STARTING ON NEW MED-WHAT SHOULD SHE DO Initial call taken by: Harlon Flor,  August 25, 2009 11:40 AM  Follow-up for Phone Call        would stop midodrine and put an ambulatory BP cuff on her, if possible. Dolores Patty, MD, [facility]  August 27, 2009 11:37 PM   Additional Follow-up for Phone Call Additional follow up Details #1::        pt aware.  will set up appt with Windell Moulding on Monday for ambulatory BP cuff. Pt called back to state that she is symptomatic with both high and low BP- instructed her that I will forward this to Dr. Gala Romney and we will proceed ambulatory BP cuff. Additional Follow-up by: Charlena Cross, RN, BSN,  August 28, 2009 9:00 AM    Additional Follow-up for Phone Call Additional follow up Details #2::    Appt made with Windell Moulding for 1/19 @ 1030 Follow-up by: Charlena Cross, RN, BSN,  August 28, 2009 12:03 PM

## 2010-09-22 NOTE — Procedures (Signed)
Summary: Upper Endoscopy w/DIL  Patient: Elizabeth [name] Note: All result statuses are Final unless otherwise noted.  Tests: (1) Upper Endoscopy w/DIL (UED)  UED Upper Endoscopy w/DIL                             DONE (C)     [facility]     [address]     [place], [place]  [address]           ENDOSCOPY PROCEDURE REPORT           PATIENT:  Elizabeth [name], Elizabeth [name]  MR#:  [id number]     BIRTHDATE:  12-16-1940, 68 yrs. old  GENDER:  female           ENDOSCOPIST:  Iva Boop, MD, [facility]           PROCEDURE DATE:  12/01/2009     PROCEDURE:  EGD with biopsy, [name] Dilation of the Esophagus     ASA CLASS:  Class III     INDICATIONS:  1) dysphagia  2) odynophagia           MEDICATIONS:   Fentanyl 75 mcg IV, Versed 4 mg IV     TOPICAL ANESTHETIC:  Exactacain Spray           DESCRIPTION OF PROCEDURE:   After the risks benefits and     alternatives of the procedure were thoroughly explained, informed     consent was obtained.  The LB GIF-H180 [id number] endoscope was     introduced through the mouth and advanced to the second portion of     the duodenum, without limitations.  The instrument was slowly     withdrawn as the mucosa was carefully examined.     <<PROCEDUREIMAGES>>           Inflammation was found at the gastroesophageal junction. Edema,     erythema and faint erosions. Z-line at 40 cm. Multiple biopsies     were obtained and sent to pathology. 1 cm submucosal nodule in     cardia.  Multiple erosions were found in the antrum. Numerous,     small erosions. Multiple biopsies were obtained and sent to     pathology.  The examination was otherwise normal.    Dilation was     then performed at the total esophagus           1) Dilator:  [name]  Size(s):  48 French     Resistance:  minimal  Heme:  none           COMPLICATIONS:  None           ENDOSCOPIC IMPRESSION:     1) Inflammation at the gastroesophageal junction - biopsied     2) Erosions, multiple in the  antrum - biopsied     3) 1 cm submucosal nodule in cardia     4) Otherwise normal examination. - 48 French esophageal dilation     due to dysphagia           abnormalities do not look infectious but biopsies taken given     immunosuppression           RECOMMENDATIONS:     1) Clear liquids until 330 PM then soft foods     2) regular food tomorrow     3) start Dexilant 60 mg each AM 30 minutes before breakfast.     Will need  close follow-up of tacrolimus levels as it can increase     these. CORRECTION: 30 mg Dexilant           REPEAT EXAM:  In for as needed.           Iva Boop, MD, [facility]           CC:  Irena Cords, M.D.     The Patient     Dorisann Frames, MD           n.     REVISED:  12/01/2009 02:59 PM     eSIGNED:   Iva Boop at 12/01/2009 02:59 PM           Ditmars, Leta Jungling, [phone]  Note: An exclamation mark (!) indicates a result that was not dispersed into the flowsheet. Document Creation Date: 12/01/2009 3:00 PM _______________________________________________________________________  (1) Order result status: Final Collection or observation date-time: 12/01/2009 13:54 Requested date-time:  Receipt date-time:  Reported date-time:  Referring Physician:   Ordering Physician: Stan Head [phone]) Specimen Source:  Source: Launa Grill Order Number: [id number] Lab site:

## 2010-09-22 NOTE — Assessment & Plan Note (Signed)
Summary: tetanus shot/jbb   History of Present Illness: Was seen at her orthopedist and he noted an abrasion on her arm. She did not know when her previous tetnus shot was so presented today.  Allergies: No Known Drug Allergies   Complete Medication List: 1)  Prograf 1 Mg Caps (Tacrolimus) .... Take 2 tablets by mouth two times a day 2)  Prednisone 5 Mg Tabs (Prednisone) .... Take 1 tablet by mouth once a day 3)  Synthroid 150 Mcg Tabs (Levothyroxine sodium) .... Take 1 tablet by mouth once a day in am 4)  Lipitor 10 Mg Tabs (Atorvastatin calcium) .... Take 1 tablet by mouth once a day on mon, wed, fri, sun 5)  Lantus For Opticlik 100 Unit/ml Soln (Insulin glargine) .Marland Kitchen.. 10 units once daily 6)  Multivitamins Tabs (Multiple vitamin) .... Take 1/2 tablet by mouth in morning, and 1/2 tablet by mouth in evening 7)  Caltrate 600+d 600-400 Mg-unit Tabs (Calcium carbonate-vitamin d) .... Take 2 tablets by mouth once a day 8)  Myfortic 360 Mg Tbec (Mycophenolate sodium) .Marland Kitchen.. 1 by mouth bid 9)  Humalog 100 Unit/ml Soln (Insulin lispro (human)) .... As directed sliding scale 10)  Gelnique 10 % Gel (Oxybutynin chloride) .... Once daily as directed 11)  Aspirin 81 Mg Tbec (Aspirin) .... Take one tablet by mouth two times a day 12)  Dexilant 30 Mg Cpdr (Dexlansoprazole) .... Take 1 tablet by mouth once daily 13)  Antivert 50 Mg Tabs (Meclizine hcl) .... Once daily  Other Orders: Tdap => 5yrs IM ([address]) Admin 1st Vaccine (60454)   Orders Added: 1)  Tdap => 42yrs IM [90715] 2)  Admin 1st Vaccine [09811]    Immunizations Administered:  Tetanus Vaccine:    Vaccine Type: Tdap    Site: left deltoid    Mfr: Sanofi Pasteur    Dose: 0.5 ml    Route: IM    Given by: Levonne Spiller EMT-P    Exp. Date: 11/20/2011    Lot #: [id number]  The risks, benefits and possible side effects of the treatments and tests were explained clearly to the patient and the patient verbalized understanding. The patient  was informed that there is no on-call Sherrilyn Nairn or services available at this clinic during off-hours (when the clinic is closed).  If the patient developed a problem or concern that required immediate attention, the patient was advised to go the the nearest available urgent care or emergency department for medical care.  The patient verbalized understanding.

## 2010-09-22 NOTE — Progress Notes (Signed)
Summary: sooner appt.  Phone Note From Other Clinic   Caller: [facility] @ Dr. Arrie Aran  [phone] Call For: Dr. Leone Payor Summary of Call: pt has an appt. on 5-16-1. would like sooner appt.Marland KitchenMarland KitchenMarland KitchenPt is having trouble swallowing Initial call taken by: Karna Christmas,  November 25, 2009 11:10 AM  Follow-up for Phone Call        I have left a voicemail with the [facility] from Dr Geanie Berlin that patient can be seen 11/30/09 1:30 Follow-up by: Darcey Nora RN, CGRN,  November 25, 2009 12:47 PM

## 2010-09-22 NOTE — Progress Notes (Signed)
Summary: needs MR abdomen  Phone Note Outgoing Call   Call placed by: Iva Boop MD, [facility],  December 08, 2009 9:12 AM Summary of Call: I called her about problems She had post-prandial loer chest, epigastric pressure pain and early satiety last PM. Similar to other problems. Staff will call her back and: (she is going out at noon) 1) Will schedule MR abdomen (NO CONTRAST) to evaluate pancreatic cystic lesion and upper abdominal pain 2) After that and depending upon results - ? vascular US tolook for mesenteric ischemia (has not had weight loss) or gastric emptying study or both 3) Stay on Dexilant and get a Prograf level from Dr. Arrie Aran by end of month if he agrees 4) fax this note to Dr. Arrie Aran once this is set up   Follow-up for Phone Call        MR Abdomen scheduled for Thursday, 12/10/09 @ 10 am at [facility].  I spoke to pt with this information.  She is unable to go on thursday, but will call and reschedule herself--appt number given.  Advised pt we would call her with further follow up when we have these results. Follow-up by: Francee Piccolo CMA [facility]),  December 08, 2009 12:11 PM     Appended Document: Orders Update Clinical Lists Changes  Problems: Added new problem of ABDOMINAL PAIN, UPPER (ICD-789.09) Orders: Added new Referral order of MRI Abdomen (MRI Abdomen) - Signed

## 2010-09-22 NOTE — Progress Notes (Signed)
Summary: procedure info needed  Phone Note Other Incoming Call back at [phone]   Caller: Chrissie Noa, with [facility] Summary of Call: fax # [phone] needs ins info an verification on the type of procedure pt had Initial call taken by: Vallarie Mare,  February 05, 2010 11:05 AM  Follow-up for Phone Call        this information sent via fax to given fax number, attn to Chrissie Noa, and confirmation received Follow-up by: Vallarie Mare,  February 05, 2010 11:15 AM

## 2010-09-22 NOTE — Assessment & Plan Note (Signed)
Summary: problems swallowing--ch.   History of Present Illness Visit Type: Follow-up Visit Primary GI MD: Stan Head MD [facility] Primary Provider: Terrial Rhodes, MD Requesting Provider: n/a Chief Complaint: dysphagia History of Present Illness:   Dysphagia x 1 year.  Much worse in the last 1-2 months with significant pain when she swallows, It takes about 30 minutes for discomfort to resolve. Every meal it is occurring and is unable to finish eating. even ygurt does it. Dr. Arrie Aran put him on Dexilant adn she did not take it due to possible interaction with Tacrolimus. No weight loss. Appetite is diminished.   GI Review of Systems    Reports chest pain, dysphagia with solids, and  nausea.      Denies abdominal pain, acid reflux, belching, bloating, dysphagia with liquids, heartburn, loss of appetite, vomiting, vomiting blood, weight loss, and  weight gain.      Reports constipation.     Denies anal fissure, black tarry stools, change in bowel habit, diarrhea, diverticulosis, fecal incontinence, heme positive stool, hemorrhoids, irritable bowel syndrome, jaundice, light color stool, liver problems, rectal bleeding, and  rectal pain.    Current Medications (verified): 1)  Prograf 1 Mg Caps (Tacrolimus) .... Take 2 Tablets By Mouth Two Times A Day 2)  Prednisone 5 Mg Tabs (Prednisone) .... Take 1 Tablet By Mouth Once A Day 3)  Synthroid 150 Mcg Tabs (Levothyroxine Sodium) .... Take 1 Tablet By Mouth Once A Day in Am 4)  Lipitor 10 Mg Tabs (Atorvastatin Calcium) .... Take 1 Tablet By Mouth Once A Day On Mon, Wed, Fri, Sat 5)  Lantus For Opticlik 100 Unit/ml Soln (Insulin Glargine) .... 30 Units Once Daily 6)  Multivitamins  Tabs (Multiple Vitamin) .... Take 1/2 Tablet By Mouth in Morning, and 1/2 Tablet By Mouth in Evening 7)  Aspir-Low 81 Mg Tbec (Aspirin) .... Once Daily 8)  Caltrate 600+d 600-400 Mg-Unit Tabs (Calcium Carbonate-Vitamin D) .... Take 2 Tablets By Mouth Once A Day 9)   Myfortic 360 Mg Tbec (Mycophenolate Sodium) .Marland Kitchen.. 1 By Mouth Bid 10)  Humalog 100 Unit/ml Soln (Insulin Lispro (Human)) .... As Directed Sliding Scale 11)  Gelnique 10 % Gel (Oxybutynin Chloride) .... Once Daily As Directed  Allergies (verified): No Known Drug Allergies  Past History:  Past Medical History: End-Stage Renal Disease s/p kidney transplant Peripheral Artery Disease s/p L BKA Osteoporosis Coronary Artery Disease  Diabetes Hyperlipidemia Hypertension Hypothyroidism Anemia Melanoma L cartoid bruit   -u/s 11/09: normal[ Adenoma colon 2010 Cystic mass body of pancreas 2005 CT/MR/EUS, follow-up CT without clear cyst, ? chronic pancreatitis  Past Surgical History: Reviewed history from 08/18/2008 and no changes required. Kidney Transplant 2001 Below-Knee amputation-left Appendectomy CABG Cataract Extraction Cholecystectomy Hysterectomy Thyroid Surgery Tonsillectomy C- section x2 Vitrecomy rt eye  Family History: Reviewed history from 02/11/2009 and no changes required. Family History of Heart Disease: Mother No FH of Colon Cancer:  Social History: Reviewed history from 08/18/2008 and no changes required. 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  Alcohol Use - no  Daily Caffeine Use -1 Illicit Drug Use - no  Review of Systems       The patient complains of back pain, cough, fatigue, night sweats, and shortness of breath.         All other ROS negative except as per HPI.   Vital Signs:  Patient profile:   70 year old female Height:      62 inches Weight:      158.25 pounds BMI:  29.05 Pulse rate:   72 / minute Pulse rhythm:   regular BP sitting:   114 / 52  (right arm) Cuff size:   regular  Vitals Entered By: June McMurray CMA [facility]) (November 30, 2009 1:23 PM)  Physical Exam  General:  obese, NAD, elderly Mouth:  clear oro and posterior pharynx Lungs:  Clear throughout to auscultation. Heart:  soft holosystolic murmur at left  base otherwise no rubs or gallops, S1 and S2 normal Abdomen:  low midline scar, LLQ scar and transplanted kidney + epigastric and right renal bruits soft and nontender without HSM Extremities:  L BKA right brace Neurologic:  Alert and  oriented x4;  11/25/09 Labs reviewed, creat 1.1, BUN 29, CBC normal, glucose 168, Hgb A1C 6% 4/5 note from Dr. Arrie Aran also reviewed  Impression & Recommendations:  Problem # 1:  DYSPHAGIA UNSPECIFIED (ICD-787.20) Assessment New  mainly odynophagia and in immunosuppressed patient need to look for infection. Severe GERD could cause also. EGD, possible dilation. Risks, benefits,and indications of endoscopic procedure(s) were reviewed with the patient and all questions answered.  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 2)  Take 1/2 of your Lantus dose on 12/01/09 as instructed on seperate instructions. 3)  [facility] Patient Information Guide given to patient.  4)  Upper Endoscopy brochure given.  5)  Copy sent to : Terrial Rhodes, MD, Dorisann Frames, MD 6)  The medication list was reviewed and reconciled.  All changed / newly prescribed medications were explained.  A complete medication list was provided to the patient / caregiver.

## 2010-09-22 NOTE — Progress Notes (Signed)
Summary: needs Prograf level  Phone Note Outgoing Call   Summary of Call: please call Dr. Hadley Pen office and let them know she started Dexilant and that Prograf level needs to be checked I think they are aware of all this and Iwill send the note from yesterday soon but I told patient we would communicate this. She is very concerned about this issue. She has a lab appt June 1 I think Iva Boop MD, [facility]  Jan 15, 2010 10:15 AM   Follow-up for Phone Call        I have left a message with Corrie Dandy at Dr Hadley Pen office to return my calls about prograf levels.  Follow-up by: Darcey Nora RN, CGRN,  Jan 15, 2010 10:28 AM  Additional Follow-up for Phone Call Additional follow up Details #1::        I spoke with the patient this am and she has labs next Tuesday.  They have a prograff level ordered. Additional Follow-up by: Darcey Nora RN, CGRN,  Jan 19, 2010 10:58 AM

## 2010-09-22 NOTE — Letter (Signed)
Summary: EGD Instructions  [facility]  [address] [place], [place] [address]   Phone: [phone]  Fax: [phone]       Elizabeth [name]    November 02, 1940    MRN: [id number]       Procedure Day Dorna Bloom: Jake Shark, 12/01/09     Arrival Time: 3:00 PM     Procedure Time: 4:00 PM     Location of Procedure:                    _X_ [facility] (4th Floor)   PREPARATION FOR ENDOSCOPY   On TUESDAY, 12/01/09 THE DAY OF THE PROCEDURE:  1.   No solid foods, milk or milk products are allowed after midnight the night before your procedure.  2.   Do not drink anything colored red or purple.  Avoid juices with pulp.  No orange juice.  3.  You may drink clear liquids until 2:00 PM, which is 2 hours before your procedure.                                                                                                CLEAR LIQUIDS INCLUDE: Water Jello Ice Popsicles Tea (sugar ok, no milk/cream) Powdered fruit flavored drinks Coffee (sugar ok, no milk/cream) Gatorade Juice: apple, white grape, white cranberry  Lemonade Clear bullion, consomm, broth Carbonated beverages (any kind) Strained chicken noodle soup Hard Candy   MEDICATION INSTRUCTIONS  Unless otherwise instructed, you should take regular prescription medications with a small sip of water as early as possible the morning of your procedure.  Diabetic patients - see separate instructions.               OTHER INSTRUCTIONS  You will need a responsible adult at least 72 years of age to accompany you and drive you home.   This person must remain in the waiting room during your procedure.  Wear loose fitting clothing that is easily removed.  Leave jewelry and other valuables at home.  However, you may wish to bring a book to read or an iPod/MP3 player to listen to music as you wait for your procedure to start.  Remove all body piercing jewelry and leave at home.  Total time from sign-in  until discharge is approximately 2-3 hours.  You should go home directly after your procedure and rest.  You can resume normal activities the day after your procedure.  The day of your procedure you should not:   Drive   Make legal decisions   Operate machinery   Drink alcohol   Return to work  You will receive specific instructions about eating, activities and medications before you leave.    The above instructions have been reviewed and explained to me by   [facility], CMA (AAMA)    I fully understand and can verbalize these instructions _____________________________ Date _________

## 2010-09-22 NOTE — Letter (Signed)
Summary: Clearance Letter  Architectural technologist at [facility] [address]   [place], [place] [address]   Phone: [phone]  Fax: [phone]    October 01, 2009  Re:     [facility] Address:   [address] [place], [place]  [address] DOB:     01-Jul-1941 MRN:     [id number]   Dear Ms. Lenoir-  You are able to return to [employer] and Fit classes effective immediately.  Should you need any further information or assistance, please feel free to contact our office at [phone].       Sincerely,    Charlena Cross, RN, BSN Arvilla Meres, MD

## 2010-09-22 NOTE — Progress Notes (Signed)
Summary: Schedule MRI  ---- Converted from flag ---- ---- 05/27/2010 3:55 PM, Francee Piccolo CMA (AAMA) wrote: call pt to schedule MRI ------------------------------  Phone Note Outgoing Call   Summary of Call: PC to pt to schedule MRI.  Pt is agreeable.  MRI is scheduled with Tobi Bastos for 06/29/10 @ 9:00 am @ [facility].  Pt is aware. Initial call taken by: Francee Piccolo CMA [facility]),  June 14, 2010 1:20 PM

## 2010-09-22 NOTE — Letter (Signed)
Summary: [facility]   Imported By: Lester Tippah 12/15/2009 10:39:09  _____________________________________________________________________  External Attachment:    Type:   Image     Comment:   External Document

## 2010-09-22 NOTE — Procedures (Signed)
Summary: Endoscopic Ultrasound  Patient: Elizabeth [name] Note: All result statuses are Final unless otherwise noted.  Tests: (1) Endoscopic Ultrasound (EUS)  EUS Endoscopic Ultrasound                             DONE     [facility]     [address] [place], [place]  [address]           ENDOSCOPIC ULTRASOUND PROCEDURE REPORT     PATIENT:  Elizabeth, [name]  MR#:  [id number]     BIRTHDATE:  Mar 18, 1941  GENDER:  female     ENDOSCOPIST:  Rachael Fee, MD     REFERRED BY:  Iva Boop, M.D., [facility]     PROCEDURE DATE:  02/04/2010     PROCEDURE:  Upper EUS w/FNA     ASA CLASS:  Class III     INDICATIONS:  pancreatic cyst; evaluated 2005 at [facility], was     2.3cm at that time, cytology/CEA/amylase performed; she was told     it was not a problem, was recommended to have repeat at 1 year     interval; recent MRI showing interval growth (up to 3.5cm or so)           MEDICATIONS:   Fentanyl 75 mcg IV, Versed 7.5 mg IV, cipro 400mg      IV     DESCRIPTION OF PROCEDURE:   After the risks benefits and     alternatives of the procedure were  explained, informed consent     was obtained. The patient was then placed in the left, lateral,     decubitus postion and IV sedation was administered. Throughout the     procedure, the patient's blood pressure, pulse and oxygen     saturations were monitored continuously.  Under direct     visualization, the  endoscope was introduced through the mouth and     advanced to the duodenum.  Water was used as necessary to provide     an acoustic interface.  Upon completion of the imaging, water was     removed and the patient was sent to the recovery room in     satisfactory condition.           <<PROCEDUREIMAGES>>           Endoscopic findings (limited views with radial and linear     echoendoscopes):     1. Normal esophagus, stomach, duodenum.           EUS findings:     1.  Anechoic (cystic) lesion in neck/body of pancreas  that is     composed of multiple small cysts with thin septea. The lesion     measures 3.4cm maximally and clearly communicates with the main     pancreatic duct.  There are no associated nodules or solid masses.     The cyst was sampled with a single pass of a 22 guage BS EUS FNA     Needle using color doppler to avoid significant nearby blood     vessels (portal vein).  This yielded 2cc of clear, visious fluid     that was sent for cytology and Red Path (CEA, amylase).     2. The pancreatic parenchyma was otherwise normal: no discrete     masses or signs of chronic pancreatitis.     3. Main pancreatic duct communicates  with the cystic lesion noted     above but was otherwise normal.     4. CBC slightly dilated (up to 7.68mm) without stones, filling     defects.     5. Gallbladder surgically absent.     6. No peripancreatic adenopathy.           Impression:     The cyst in neck of pancreas has clearly grown since evaluation 6     years ago in [place]. It is 3.4cm maximally now, communicates     with the main pancreatic duct but otherwise has no concerning     features such as associated nodules or solid masses.  I suspect it     is a serous cystadenoma or perhaps IPMN (main duct).  Await final     fluid analysis (CEA, amylase, cytology).  Given multiple     comorbities (DM, significant PVD s/p BKA, renal tranplant     recipient) there would need to be very strong evidence of     malignancy or malignant potential to recommend resection.     She will complete 3 days of cipro twice daily.           _____________________________     Rachael Fee, MD           n.     eSIGNED:   Rachael Fee at 02/04/2010 09:07 AM           Strausser, Leta Jungling, [id number]  Note: An exclamation mark (!) indicates a result that was not dispersed into the flowsheet. Document Creation Date: 02/04/2010 9:08 AM _______________________________________________________________________  (1) Order  result status: Final Collection or observation date-time: 02/04/2010 08:43 Requested date-time:  Receipt date-time:  Reported date-time:  Referring Physician:   Ordering Physician: Rob Bunting [phone]) Specimen Source:  Source: Launa Grill Order Number: [id number] Lab site:

## 2010-09-22 NOTE — Progress Notes (Signed)
Summary: Follow up MRI  ---- Converted from flag ---- ---- 05/04/2010 10:41 AM, Laureen Ochs LPN wrote: She needs a f/u MRI on 05/2010. Please call pt. and schedule. ------------------------------  Phone Note Outgoing Call Call back at [facility] Phone [phone]   Summary of Call: PC to pt to schedule MRI.  Per her husband, Mrs. Glas is an inpatient at [facility]  She had a CT on 10/4.  Pt's husband wants to know if the MRI could be ordered while she is in the hospital or if we need to wait.  She is in under Dr. Arrie Aran, but Dr. Russella Dar has consulted due to chocolate brown emesis.  OK to leave message per husband. Please advise. Initial call taken by: Francee Piccolo CMA Duncan Dull),  May 26, 2010 10:28 AM  Follow-up for Phone Call        I recommend she wait until she is better (and probably outpatient) to do the exam, not due tril later in the month.  Please keep track Follow-up by: Iva Boop MD, Clementeen Graham,  May 26, 2010 3:11 PM  Additional Follow-up for Phone Call Additional follow up Details #1::        message left on home voicemail with above information.  pt to call with any concerns or questions. Additional Follow-up by: Francee Piccolo CMA Duncan Dull),  May 27, 2010 3:54 PM

## 2010-09-22 NOTE — Letter (Signed)
Summary: Diabetic Instructions  [facility]  [address] [place], [place] [address]   Phone: [phone]  Fax: [phone]    Elizabeth [name] 09-25-1940 MRN: [id number]   _X_   INSULIN (LONG ACTING) MEDICATION INSTRUCTIONS (Lantus, NPH, 70/30, Humulin, Novolin-N)   The day of your procedure:    Take 1/2 of your morning dose

## 2010-09-22 NOTE — Progress Notes (Signed)
Summary: [place] ?'s  Phone Note Call from Patient Call back at [phone]   Caller: Patient Call For: Dr. Christella Hartigan Reason for Call: Talk to Nurse Summary of Call: has questions about [place] Initial call taken by: Vallarie Mare,  February 01, 2010 10:48 AM  Follow-up for Phone Call        pt was confirming the location of the EUS, I explained that she needs to go to Out pt Registration in [facility].  Pt thanked me for helping her. Follow-up by: Chales Abrahams CMA Duncan Dull),  February 01, 2010 10:52 AM

## 2010-09-22 NOTE — Letter (Signed)
Summary: Appt Reminder 2  [facility]  [address] [place], [place] [address]   Phone: [phone]  Fax: [phone]        May 27, 2010 MRN: [id number]    [facility] [address] [place], [place]  [address]    Dear Ms. Mottern,   You have a return appointment with Dr. Leone Payor on 07-09-10 at 2:45pm.  Please remember to bring a complete list of the medicines you are taking, your insurance card and your co-pay.  If you have to cancel or reschedule this appointment, please call before 5:00 pm the evening before to avoid a cancellation fee.  If you have any questions or concerns, please call [phone].    Sincerely,  Dr Marvell Fuller office

## 2010-09-22 NOTE — Letter (Signed)
Summary: EGD Instructions  [facility]  [address] [place], [place] [address]   Phone: [phone]  Fax: [phone]       Elizabeth [name]    [birth year]    MRN: [id number]       Procedure Day /Date:02/04/10  [name]       Arrival Time: 7 am     Procedure Time:8 am    Location of Procedure:                     [facility] ( Outpatient Registration)    PREPARATION FOR ENDOSCOPY   On 02/04/10  THE DAY OF THE PROCEDURE:  1.   No solid foods, milk or milk products are allowed after midnight the night before your procedure.  2.   Do not drink anything colored red or purple.  Avoid juices with pulp.  No orange juice.  3.  You may drink clear liquids until 4 am , which is 4 hours before your procedure.                                                                                                CLEAR LIQUIDS INCLUDE: Water Jello Ice Popsicles Tea (sugar ok, no milk/cream) Powdered fruit flavored drinks Coffee (sugar ok, no milk/cream) Gatorade Juice: apple, white grape, white cranberry  Lemonade Clear bullion, consomm, broth Carbonated beverages (any kind) Strained chicken noodle soup Hard Candy   MEDICATION INSTRUCTIONS  Unless otherwise instructed, you should take regular prescription medications with a small sip of water as early as possible the morning of your procedure.  Diabetic patients - see separate instructions.             OTHER INSTRUCTIONS  You will need a responsible adult at least 72 years of age to accompany you and drive you home.   This person must remain in the waiting room during your procedure.  Wear loose fitting clothing that is easily removed.  Leave jewelry and other valuables at home.  However, you may wish to bring a book to read or an iPod/MP3 player to listen to music as you wait for your procedure to start.  Remove all body piercing jewelry and leave at home.  Total time from sign-in until discharge  is approximately 2-3 hours.  You should go home directly after your procedure and rest.  You can resume normal activities the day after your procedure.  The day of your procedure you should not:   Drive   Make legal decisions   Operate machinery   Drink alcohol   Return to work  You will receive specific instructions about eating, activities and medications before you leave.    The above instructions have been reviewed and explained to me by   Chales Abrahams CMA Duncan Dull)  January 25, 2010 1:39 PM     I fully understand and can verbalize these instructions over the phone mailed to home Date 01/25/10

## 2010-09-22 NOTE — Miscellaneous (Signed)
  Clinical Lists Changes  Observations: Added new observation of GFR AA: >59 (07/29/2009 16:47) Added new observation of GFR: >59 (07/29/2009 16:47) Added new observation of ALBUMIN: 3.8 g/dL (16/05/9603 54:09) Added new observation of PROTEIN, TOT: 5.7 g/dL (81/19/1478 29:56) Added new observation of CALCIUM: 9.7 mg/dL (21/30/8657 84:69) Added new observation of ALK PHOS: 62 units/L (07/29/2009 16:47) Added new observation of BILI TOTAL: 0.9 mg/dL (62/95/2841 32:44) Added new observation of SGPT (ALT): 30 units/L (07/29/2009 16:47) Added new observation of SGOT (AST): 24 units/L (07/29/2009 16:47) Added new observation of CO2 PLSM/SER: 29 meq/L (07/29/2009 16:47) Added new observation of CL SERUM: 101 meq/L (07/29/2009 16:47) Added new observation of K SERUM: 4.4 meq/L (07/29/2009 16:47) Added new observation of NA: 141 meq/L (07/29/2009 16:47) Added new observation of CREATININE: 0.92 mg/dL (08/24/7251 66:44) Added new observation of BUN: 24 mg/dL (03/47/4259 56:38) Added new observation of BG RANDOM: 240 mg/dL (75/64/3329 51:88)

## 2010-09-22 NOTE — Progress Notes (Signed)
Summary: pancreatic cyst stable  Phone Note Outgoing Call   Summary of Call: let her know pancreatic lesion is stable we will send a copy of report to Dr. Jacquenette Shone and [place]. She should also get a copy of the images on disc to take to her next visit with Dr. Jacqu[facility]ette Shone at [facility] MD, [facility]  July 06, 2010 10:35 PM    Follow-up for Phone Call        pt notified.  she has an REV on Friday and we will discuss follow up at that time. Follow-up by: Francee Piccolo CMA Duncan Dull),  July 07, 2010 9:39 AM

## 2010-09-22 NOTE — Progress Notes (Signed)
Summary: sch an appt  Phone Note Call from Patient Call back at Home Phone [phone]   Caller: Patient Call For: Elizabeth [name] Reason for Call: Talk to Nurse Summary of Call: Patient wants to schedule an appt for next week to see Dr Christella Hartigan and go over EUS results but she's a patient of Dr Elizabeth [name] and Dr Christella Hartigan already went over the results with her over the phone. Initial call taken by: Tawni Levy,  February 16, 2010 3:18 PM  Follow-up for Phone Call        advised her that Dr Elizabeth [name] is aware of all the results and she should schedule her follow up with Dr Elizabeth [name].  She is offered an appointment for this Thursday  02/18/10 with Dr Elizabeth [name], but she declined.  Patient  says she will call back to schedule  Follow-up by: Darcey Nora RN, CGRN,  February 16, 2010 3:33 PM

## 2010-09-22 NOTE — Assessment & Plan Note (Signed)
Summary: POST HOSP/PANCREATIC CYST/YF   History of Present Illness Visit Type: Follow-up Visit Primary GI MD: Stan Head MD [facility] Primary Provider: Terrial Rhodes, MD Requesting Provider: n/a Chief Complaint: pancreatic cystic disease History of Present Illness:   70 yo ww with a cystic lesion of the pancreas of unclear etiology. She is here witrh husband to discuss recent MR follow-up. She had seen Dr.Ted Pappas of [facility] in August, and he has recommended follow-up with MR and watchful waiting at this point, with possible enucleation surgery in the future. She has ESRD and is s/p kidney transplant. She is not having any abdominal pain at this time. She was hospitalized with pneumonia in October and is recovered. She is having some problems with LLE and stump after amputation and is seeing an orthopedist.   GI Review of Systems      Denies abdominal pain, acid reflux, belching, bloating, chest pain, dysphagia with liquids, dysphagia with solids, heartburn, loss of appetite, nausea, vomiting, vomiting blood, weight loss, and  weight gain.        Denies anal fissure, black tarry stools, change in bowel habit, constipation, diarrhea, diverticulosis, fecal incontinence, heme positive stool, hemorrhoids, irritable bowel syndrome, jaundice, light color stool, liver problems, rectal bleeding, and  rectal pain.   ENTER$ED IN ERROR AS CT - see MRI  CT Abdomen/Pelvis  Procedure date:  07/04/2010  Findings:       1.  Similar size and morphology of a cystic lesion within the pancreatic head.  Concurrent smaller cystic foci within both the upstream and downstream pancreas.  Favored etiologies remain mixed branch/main duct intraductal papillary mucinous tumor versus pseudocyst.  Although the morphology of the lesion and patient demographics would suggest a serous cystadenoma, the apparent communication with the pancreatic duct and other foci of pancreatic cystic change would  not be typical.  Recommend ongoing MR surveillance.  Consider follow-up at 6 - 12 months. 2.  Findings of hemosiderosis.   3.  Cholecystectomy with biliary system at the upper limits of normal. 4.  Left iliac fossa renal transplant with similar transplant hydronephrosis since 05/25/2010.  Incompletely imaged.  MRI of Abdomen  Procedure date:  07/04/2010  Findings:       1.  Similar size and morphology of a cystic lesion within the pancreatic head.  Concurrent smaller cystic foci within both the upstream and downstream pancreas.  Favored etiologies remain mixed branch/main duct intraductal papillary mucinous tumor versus pseudocyst.  Although the morphology of the lesion and patient demographics would suggest a serous cystadenoma, the apparent communication with the pancreatic duct and other foci of pancreatic cystic change would not be typical.  Recommend ongoing MR surveillance.  Consider follow-up at 6 - 12 months. 2.  Findings of hemosiderosis.   3.  Cholecystectomy with biliary system at the upper limits of normal. 4.  Left iliac fossa renal transplant with similar transplant hydronephrosis since 05/25/2010.  Incompletely imaged.   Current Medications (verified): 1)  Prograf 0.5 Mg Caps (Tacrolimus) .... 3 Tablets in Morning and 4 Tablets in The Evening 2)  Prednisone 5 Mg Tabs (Prednisone) .... Take 1 Tablet By Mouth Once A Day 3)  Synthroid 150 Mcg Tabs (Levothyroxine Sodium) .... Take 1 Tablet By Mouth Once A Day in Am 4)  Lipitor 10 Mg Tabs (Atorvastatin Calcium) .... Take 1 Tablet By Mouth Once A Day On Mon, Wed, Fri, Sun 5)  Lantus For Opticlik 100 Unit/ml Soln (Insulin Glargine) .Marland Kitchen.. 12 Units Once Daily 6)  Multivitamins  Tabs (Multiple Vitamin) .... Take 1/2 Tablet By Mouth in Morning, and 1/2 Tablet By Mouth in Evening 7)  Caltrate 600+d 600-400 Mg-Unit Tabs (Calcium Carbonate-Vitamin D) .... Take 2 Tablets By Mouth Once A Day 8)  Myfortic 360 Mg Tbec  (Mycophenolate Sodium) .Marland Kitchen.. 1 By Mouth Bid 9)  Humalog 100 Unit/ml Soln (Insulin Lispro (Human)) .... As Directed Sliding Scale 10)  Aspirin 81 Mg Tbec (Aspirin) .... 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 Kidney Transplant 2001 Below-Knee amputation-left Appendectomy CABG Cataract Extraction Cholecystectomy Hysterectomy Thyroid Surgery Tonsillectomy C- section x2 Vitrectomy rt eye  Family History: Reviewed history from 02/11/2009 and no changes required. Family History of Heart Disease: Mother No FH of Colon Cancer:  Social History: Reviewed history from 08/18/2008 and no changes required. 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  Alcohol Use - no  Daily Caffeine Use -1 Illicit Drug Use - no  Review of Systems       per HPI  Vital Signs:  Patient profile:   70 year old female Height:      63 inches Weight:      160 pounds BMI:     28.45 Pulse rate:   56 / minute Pulse rhythm:   regular BP sitting:   138 / 64  (right arm)  Vitals Entered By: Milford Cage NCMA (July 09, 2010 3:14 PM)  Physical Exam  General:  Well developed, well nourished, no acute distress. 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 we spent 15 minutes +  discussing the situation and reviewed Dr. Jacquenette Shone' recommendations from her visit with him in August. I will get the images sent to Dr. Jacquenette Shone and will also contact him via Email or telephone. She knows to contact me if she has not heard back about plans within several weeks. we suspect more observation and repeat MR (No contrast since she is a transplant patient) at some point (? 6 mos). Her comorbidities do not preclude surgery but if it is not growing I suspect Dr. Jacquenette Shone will continue to follow.  Patient Instructions: 1)  We will send a copy of the MRI images and send to Dr. Jacquenette Shone. 2)  Dr. Leone Payor will discuss it with Dr. Jacquenette Shone and let you know his recommendations after he reviews the images. 3)  If you do not hear from [place] by Dec 10, please call. 4)  Copy sent to :Terrial Rhodes, MD, Toy Cookey, MD ([facility]) 5)  The medication list was reviewed and reconciled.  All changed / newly prescribed medications were explained.  A complete medication list was provided to the patient / caregiver.

## 2010-09-22 NOTE — Procedures (Signed)
Summary: Recall / [facility]  Recall / [facility]   Imported By: Lennie Odor 01/22/2010 15:36:01  _____________________________________________________________________  External Attachment:    Type:   Image     Comment:   External Document

## 2010-09-23 NOTE — Letter (Signed)
Summary: [facility]   Imported By: Lester [place] 08/02/2010 07:59:39  _____________________________________________________________________  External Attachment:    Type:   Image     Comment:   External Document

## 2010-09-23 NOTE — Progress Notes (Signed)
Summary: Has Dr. Jacquenette Shone received MR discs?  Phone Note Outgoing Call   Summary of Call: Please contact Dr. Milana Kidney' office at [facility] to see if he received MRI images on this patient and inquire if he has read them. He could Email me [email or web address] or page me [phone] or cell is [phone] when he gets a chance Iva Boop MD, [facility]  July 28, 2010 8:50 AM   Follow-up for Phone Call        I spoke with Dr Sharyne Peach office he has reviewed the MRI.  He recommended follow up MRI in 6 months.  I have asked his office to contact Dr Leone Payor at one of the 3 ways provided.  She will pass the message along to Dr Jacquenette Shone.  I have sent myself a reminder flag to schedule MRI in 6 months from scan. Follow-up by: Darcey Nora RN, CGRN,  July 28, 2010 10:21 AM  Additional Follow-up for Phone Call Additional follow up Details #1::        Ok - let Ms Coakley know plan as abve and let us place a remnder or recall for the MR. I will wait for formal follow-up from Dr. Jacquenette Shone but trust this (perhaps he dictated a note - if so I will wait for that to come and he does not need to contact me you can let secretary know and thank them.  Additional Follow-up by: Iva Boop MD, Clementeen Graham,  July 28, 2010 2:17 PM    Additional Follow-up for Phone Call Additional follow up Details #2::    Office notified and patient aware. Follow-up by: Darcey Nora RN, CGRN,  July 28, 2010 2:26 PM   Appended Document: Has Dr. Jacquenette Shone received MR discs? I spoke with Dr. Jacquenette Shone last week  and plan is for repeat MR of pancreas (NO IV CONTRAST) in 6 months. Let her know and place recall/tickle  Appended Document: Has Dr. Jacquenette Shone received MR discs? patient aware

## 2010-09-28 ENCOUNTER — Ambulatory Visit (INDEPENDENT_AMBULATORY_CARE_PROVIDER_SITE_OTHER): Payer: Medicare Other | Admitting: Internal Medicine

## 2010-09-28 ENCOUNTER — Encounter: Payer: Self-pay | Admitting: Internal Medicine

## 2010-09-28 DIAGNOSIS — R0602 Shortness of breath: Secondary | ICD-10-CM | POA: Insufficient documentation

## 2010-09-28 DIAGNOSIS — I359 Nonrheumatic aortic valve disorder, unspecified: Secondary | ICD-10-CM

## 2010-09-28 DIAGNOSIS — I2510 Atherosclerotic heart disease of native coronary artery without angina pectoris: Secondary | ICD-10-CM

## 2010-09-28 DIAGNOSIS — I10 Essential (primary) hypertension: Secondary | ICD-10-CM

## 2010-09-30 ENCOUNTER — Ambulatory Visit: Payer: Medicare Other | Admitting: Cardiovascular Disease

## 2010-09-30 ENCOUNTER — Encounter: Payer: Self-pay | Admitting: Cardiovascular Disease

## 2010-09-30 DIAGNOSIS — R0602 Shortness of breath: Secondary | ICD-10-CM

## 2010-10-07 NOTE — Letter (Signed)
Summary: Radio broadcast assistant at [facility] [address]   [place], [place] [address]   Phone: [phone]  Fax: [phone]      [facility] MYOVIEW  Patient Name:  Elizabeth [name]          MRN:  [id number]  DOB:  Feb 27, 1941             Weight:  166 Home Telephone:  [phone] P PH             Work Phone:   Referring Physician:   _____ [facility] Myoview  __X___ Lexi Scan   Instructions regarding medication: Hold Insulins in AM, may restart after you can eat.    PLEASE NOTIFY THE OFFICE AT LEAST 24 HOURS IN ADVANCE IF YOU ARE UNABLE TO KEEP YOUR APPOINTMENT.  [phone]   How to prepare for your Myoview test:  1)   Do not eat or drink 4 hours prior to arrival time. 2)   No caffeine or decaffeinated 12 hours prior to arrival. 3)   Your medication may be taken with water.  If your doctor stopped a        medicaiton because of this test, do not take that medication. 4)   Ladies, please do not wear dresses.  Skirts or pants are appropriate.       Please wear a short sleeve shirt. 5)   No perfume, cologne or lotion. 6)   Wear comfortable walking shoes.  No heels!    PLEASE REPORT TO [facility] MEDICAL MALL ENTRANCE. THE VOLUNTEERS AT THE FIRST DESK WILL DIRECT YOU WHERE TO GO.  Appended Document: [facility] Myoview Scheduled for 10/12/10 with Dr. Mariah Milling at 12:30.  Appended Document: [facility] Myoview Changed appt to 0800 on 09/30/10.

## 2010-10-08 ENCOUNTER — Encounter: Payer: Self-pay | Admitting: Internal Medicine

## 2010-10-13 NOTE — Assessment & Plan Note (Signed)
Summary: 3 month F/U s/p [facility] Oct. 2011   Visit Type:  Follow-up Referring Provider:  n/a Primary Provider:  Terrial Rhodes, MD  CC:  Was at [facility] with pneumonia in Oct. 2011.  She has shortness of breath with over exertion.Elizabeth [name]  History of Present Illness: Mersedes is a delightful 70 year old woman with a history of end-stage renal disease secondary to diabetic nephropathy. She is status post kidney transplant in December 2001 with normal creatinine.  She also has diabetes complicated by retinopathy, neuropathy, and nephropathy. Remainder of history is notable for coronary artery disease status post 1-vessel bypass of her circumflex in 2001, orthostatic hypotension, peripheral vascular disease status post left BKA in the setting of a Charcot joint.   We have been following her for her CAD and orthostatic hypotension. Returns today for routine f/u.   Was admitted to [facility] in October 2011 with severe PNA. At that point peak Trop 0.19. Echo showed EF 50-55% with mild AI.   Carotid u/s 11/09. normal.   Post hospital had a near fall and bruised her L stump and was unable to walk easily for months. Now going to rehab and walking with walker. Gets tired and mild dyspnea with mild exertion. No CP. No major problems with orthostasis.   Recent labs hgb 12.4 Cr 0.89 Albumin 3.7   Current Medications (verified): 1)  Prograf 0.5 Mg Caps (Tacrolimus) .... 3 Tablets in Morning and 4 Tablets in The Evening 2)  Prednisone 5 Mg Tabs (Prednisone) .... Take 1 Tablet By Mouth Once A Day 3)  Synthroid 150 Mcg Tabs (Levothyroxine Sodium) .... Take 1 Tablet Two Times A Day 4)  Lipitor 10 Mg Tabs (Atorvastatin Calcium) .... Take 1 Tablet By Mouth Once A Day On Mon, Wed, Fri, Sun 5)  Lantus For Opticlik 100 Unit/ml Soln (Insulin Glargine) .Elizabeth [name].. 12 Units Once Daily 6)  Multivitamins  Tabs (Multiple Vitamin) .... Take 1/2 Tablet By Mouth in Morning, and 1/2 Tablet By Mouth in Evening 7)  Caltrate 600+d 600-400 Mg-Unit  Tabs (Calcium Carbonate-Vitamin D) .... Take 2 Tablets By Mouth Once A Day 8)  Myfortic 360 Mg Tbec (Mycophenolate Sodium) .... One Tablet Two Times A Day 9)  Humalog 100 Unit/ml Soln (Insulin Lispro (Human)) .... As Directed Sliding Scale 10)  Aspirin 81 Mg Tbec (Aspirin) .... 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  Alcohol Use - no  Daily Caffeine Use -1 Illicit Drug Use - no  Risk Factors: Smoking Status: never (08/18/2008)  Review of Systems       As per HPI and past medical history; otherwise all systems negative.   Vital Signs:  Patient profile:   70 year old female Height:      63 inches Weight:  166 pounds BMI:     29.51 Pulse rate:   52 / minute BP sitting:   171 / 78  (left arm) Cuff size:   regular  Vitals Entered By: Bishop Dublin, CMA (September 28, 2010 3:22 PM)  Physical Exam  General:  Well appearing. no resp difficulty HEENT: normal Neck: supple. no JVD. Carotids 2+ bilat; with bilat radiated bruits. No  lymphadenopathy or thryomegaly appreciated. Cor: PMI nondisplaced. Regular rate & rhythm. No rubs, gallops 2/6 SEM at RSB s2 well preserved. Lungs: clear Abdomen: soft, nontender, nondistended. No hepatosplenomegaly. No bruits or masses. Good bowel sounds. Extremities: no cyanosis, clubbing. s/p L bka. R leg in brace. no edema Neuro: alert & orientedx3, cranial nerves grossly intact. moves all 4 extremities w/o difficulty. affect pleasant   New Orders:     1)  Nuclear Stress Test (Nuc Stress Test)  Due: 09/28/2010   Impression & Recommendations:  Problem # 1:  CAD, NATIVE VESSEL (ICD-414.01) No current anginal symptoms but given bump in cardiac enzymes during recent hospitalization and fact that she is a diabetic who is 10 years out from her CABG I feel she needs a Myoview to rule out signifcant underlying ischemia. We will schedule.   Orders: Nuclear Stress Test (Nuc Stress Test)  Problem # 2:  AORTIC VALVE DISEASE Mild. Stable by recent echo. No further w/u at this time.   Problem # 3:  HYPERTENSION, BENIGN (ICD-401.1) BP elevated today but given problems with orthostasis will not push her anti-hypertensive regimen at this point.   Patient Instructions: 1)  Your physician has requested that you have a Lexiscan myoview.

## 2010-10-14 ENCOUNTER — Telehealth: Payer: Self-pay | Admitting: Internal Medicine

## 2010-10-19 NOTE — Progress Notes (Signed)
Summary: Stress test and BP questions  Phone Note Call from Patient Call back at Home Phone [phone]   Caller: Patient Call For: Nurse Summary of Call: Pt was calling about results of stress test and also states that BP has been running high during exercise class. On 10/11/10- 180/62, 140/62, 140/37, 140/50. Yesterday 2/22- 120/70, 179/79. pt has been having palpitations during physical therapy also. Please advise.  Initial call taken by: Lysbeth Galas CMA,  October 14, 2010 9:45 AM  Follow-up for Phone Call        Myoview looks fine. I have been letting the renal docs manage her BP. If palpitations continue we can place monitor. Dolores Patty, MD, [facility]  October 14, 2010 12:20 PM  Pt notified/sab Follow-up by: Lysbeth Galas CMA,  October 14, 2010 2:44 PM

## 2010-10-21 ENCOUNTER — Encounter: Payer: Medicare Other | Admitting: Orthopedic Surgery

## 2010-11-02 ENCOUNTER — Inpatient Hospital Stay (HOSPITAL_COMMUNITY): Payer: Medicare Other

## 2010-11-02 ENCOUNTER — Emergency Department (HOSPITAL_COMMUNITY): Payer: Medicare Other

## 2010-11-02 ENCOUNTER — Inpatient Hospital Stay (HOSPITAL_COMMUNITY)
Admission: EM | Admit: 2010-11-02 | Discharge: 2010-11-16 | DRG: 064 | Disposition: A | Discharge status: Skilled Nursing Facility | Payer: Medicare Other | Attending: Internal Medicine | Admitting: Internal Medicine

## 2010-11-02 DIAGNOSIS — A5211 Tabes dorsalis: Secondary | ICD-10-CM | POA: Diagnosis present

## 2010-11-02 DIAGNOSIS — Z7982 Long term (current) use of aspirin: Secondary | ICD-10-CM

## 2010-11-02 DIAGNOSIS — Z951 Presence of aortocoronary bypass graft: Secondary | ICD-10-CM

## 2010-11-02 DIAGNOSIS — R4789 Other speech disturbances: Secondary | ICD-10-CM | POA: Diagnosis present

## 2010-11-02 DIAGNOSIS — I4891 Unspecified atrial fibrillation: Secondary | ICD-10-CM | POA: Diagnosis not present

## 2010-11-02 DIAGNOSIS — IMO0002 Reserved for concepts with insufficient information to code with codable children: Secondary | ICD-10-CM

## 2010-11-02 DIAGNOSIS — R7881 Bacteremia: Secondary | ICD-10-CM | POA: Diagnosis not present

## 2010-11-02 DIAGNOSIS — E1169 Type 2 diabetes mellitus with other specified complication: Secondary | ICD-10-CM | POA: Diagnosis not present

## 2010-11-02 DIAGNOSIS — D696 Thrombocytopenia, unspecified: Secondary | ICD-10-CM | POA: Diagnosis present

## 2010-11-02 DIAGNOSIS — B964 Proteus (mirabilis) (morganii) as the cause of diseases classified elsewhere: Secondary | ICD-10-CM | POA: Diagnosis not present

## 2010-11-02 DIAGNOSIS — I495 Sick sinus syndrome: Secondary | ICD-10-CM | POA: Diagnosis not present

## 2010-11-02 DIAGNOSIS — Z940 Kidney transplant status: Secondary | ICD-10-CM

## 2010-11-02 DIAGNOSIS — E1142 Type 2 diabetes mellitus with diabetic polyneuropathy: Secondary | ICD-10-CM | POA: Diagnosis present

## 2010-11-02 DIAGNOSIS — Z794 Long term (current) use of insulin: Secondary | ICD-10-CM

## 2010-11-02 DIAGNOSIS — I6340 Cerebral infarction due to embolism of unspecified cerebral artery: Principal | ICD-10-CM | POA: Diagnosis present

## 2010-11-02 DIAGNOSIS — N390 Urinary tract infection, site not specified: Secondary | ICD-10-CM | POA: Diagnosis not present

## 2010-11-02 DIAGNOSIS — E1149 Type 2 diabetes mellitus with other diabetic neurological complication: Secondary | ICD-10-CM | POA: Diagnosis present

## 2010-11-02 DIAGNOSIS — E1139 Type 2 diabetes mellitus with other diabetic ophthalmic complication: Secondary | ICD-10-CM | POA: Diagnosis present

## 2010-11-02 DIAGNOSIS — E1129 Type 2 diabetes mellitus with other diabetic kidney complication: Secondary | ICD-10-CM | POA: Diagnosis present

## 2010-11-02 DIAGNOSIS — E876 Hypokalemia: Secondary | ICD-10-CM | POA: Diagnosis not present

## 2010-11-02 DIAGNOSIS — H53469 Homonymous bilateral field defects, unspecified side: Secondary | ICD-10-CM | POA: Diagnosis not present

## 2010-11-02 DIAGNOSIS — I619 Nontraumatic intracerebral hemorrhage, unspecified: Secondary | ICD-10-CM | POA: Diagnosis not present

## 2010-11-02 DIAGNOSIS — M810 Age-related osteoporosis without current pathological fracture: Secondary | ICD-10-CM | POA: Diagnosis present

## 2010-11-02 DIAGNOSIS — S88119A Complete traumatic amputation at level between knee and ankle, unspecified lower leg, initial encounter: Secondary | ICD-10-CM

## 2010-11-02 DIAGNOSIS — I82629 Acute embolism and thrombosis of deep veins of unspecified upper extremity: Secondary | ICD-10-CM | POA: Diagnosis not present

## 2010-11-02 DIAGNOSIS — Z79899 Other long term (current) drug therapy: Secondary | ICD-10-CM

## 2010-11-02 DIAGNOSIS — N058 Unspecified nephritic syndrome with other morphologic changes: Secondary | ICD-10-CM | POA: Diagnosis present

## 2010-11-02 DIAGNOSIS — I739 Peripheral vascular disease, unspecified: Secondary | ICD-10-CM | POA: Diagnosis present

## 2010-11-02 DIAGNOSIS — Z85820 Personal history of malignant melanoma of skin: Secondary | ICD-10-CM

## 2010-11-02 DIAGNOSIS — I2510 Atherosclerotic heart disease of native coronary artery without angina pectoris: Secondary | ICD-10-CM | POA: Diagnosis present

## 2010-11-02 DIAGNOSIS — E039 Hypothyroidism, unspecified: Secondary | ICD-10-CM | POA: Diagnosis present

## 2010-11-02 DIAGNOSIS — E785 Hyperlipidemia, unspecified: Secondary | ICD-10-CM | POA: Diagnosis present

## 2010-11-02 DIAGNOSIS — I4892 Unspecified atrial flutter: Secondary | ICD-10-CM | POA: Diagnosis not present

## 2010-11-02 DIAGNOSIS — E11319 Type 2 diabetes mellitus with unspecified diabetic retinopathy without macular edema: Secondary | ICD-10-CM | POA: Diagnosis present

## 2010-11-02 LAB — URINALYSIS, ROUTINE W REFLEX MICROSCOPIC
Glucose, UA: 500 mg/dL — AB
Ketones, ur: 15 mg/dL — AB
pH: 7 (ref 5.0–8.0)

## 2010-11-02 LAB — DIFFERENTIAL
Basophils Absolute: 0 10*3/uL (ref 0.0–0.1)
Basophils Relative: 0 % (ref 0–1)
Eosinophils Relative: 2 % (ref 0–5)
Monocytes Absolute: 0.4 10*3/uL (ref 0.1–1.0)
Neutro Abs: 3.6 10*3/uL (ref 1.7–7.7)

## 2010-11-02 LAB — CBC
MCHC: 32.2 g/dL (ref 30.0–36.0)
Platelets: 129 10*3/uL — ABNORMAL LOW (ref 150–400)
RDW: 13.3 % (ref 11.5–15.5)

## 2010-11-02 LAB — CARDIAC PANEL(CRET KIN+CKTOT+MB+TROPI)
Relative Index: INVALID (ref 0.0–2.5)
Total CK: 28 U/L (ref 7–177)
Troponin I: 0.14 ng/mL — ABNORMAL HIGH (ref 0.00–0.06)

## 2010-11-02 LAB — PROTIME-INR
INR: 1.02 (ref 0.00–1.49)
Prothrombin Time: 13.6 seconds (ref 11.6–15.2)

## 2010-11-02 LAB — COMPREHENSIVE METABOLIC PANEL
Albumin: 3.6 g/dL (ref 3.5–5.2)
Alkaline Phosphatase: 50 U/L (ref 39–117)
BUN: 24 mg/dL — ABNORMAL HIGH (ref 6–23)
Chloride: 105 mEq/L (ref 96–112)
Glucose, Bld: 268 mg/dL — ABNORMAL HIGH (ref 70–99)
Potassium: 4.1 mEq/L (ref 3.5–5.1)
Total Bilirubin: 1.1 mg/dL (ref 0.3–1.2)

## 2010-11-02 LAB — TROPONIN I: Troponin I: 0.17 ng/mL — ABNORMAL HIGH (ref 0.00–0.06)

## 2010-11-02 LAB — POCT CARDIAC MARKERS

## 2010-11-02 MED ORDER — GADOBENATE DIMEGLUMINE 529 MG/ML IV SOLN
18.0000 mL | Freq: Once | INTRAVENOUS | Status: AC
  Administered 2010-11-02: 18 mL via INTRAVENOUS

## 2010-11-02 MED ORDER — IOHEXOL 350 MG/ML SOLN
50.0000 mL | Freq: Once | INTRAVENOUS | Status: AC | PRN
  Administered 2010-11-02: 50 mL via INTRAVENOUS

## 2010-11-02 NOTE — H&P (Signed)
Elizabeth [name], Elizabeth [name]            ACCOUNT NO.:  [id number]  MEDICAL RECORD NO.:  [id number]           PATIENT TYPE:  I  LOCATION:  3013                         FACILITY:  [facility]  PHYSICIAN:  Brendia Sacks, MD    DATE OF BIRTH:  [birth year]  DATE OF ADMISSION:  11/02/2010 DATE OF DISCHARGE:                             HISTORY & PHYSICAL   REFERRING PHYSICIAN:  Azalia Bilis, MD  NEPHROLOGIST:  Elizabeth [name], M.D.  CHIEF COMPLAINT:  Slurred speech.  HISTORY OF PRESENT ILLNESS:  This is a 70 year old woman who was in her usual state of health until today when she woke up about 8 a.m. and was disoriented.  She tried use her walker, but noted that her balance was quite poor and that her speech was slurred.  She had a terrible headache, nausea and vomiting.  The patient was brought to emergency room where workup revealed acute stroke.  She was seen in consultation with Neurology in the emergency room and admitted for further evaluation.  Neurology has already completed stroke admission orders.  REVIEW OF SYSTEMS:  Negative for fever, changes to her vision, sore throat, rash, muscle aches, chest pain, shortness of breath, abdominal pain, dysuria, or bleeding.  Positive for diarrhea.  PAST MEDICAL HISTORY: 1. Diabetes mellitus type 2 with end-stage renal disease, retinopathy,     and neuropathy. 2. Pancreatic cyst followed by Dr. Leone Payor. 3. Coronary artery disease. 4. Hypothyroidism. 5. Peripheral vascular disease. 6. Refractory osteoporosis. 7. Hyperlipidemia. 8. Charcot foot deformity on the right. 9. Thrombocytopenia. 10.Orthostatic hypotension secondary to autonomic dysfunction.  PAST SURGICAL HISTORY: 1. Renal transplant. 2. CABG. 3. Bilateral cataract extraction. 4. Appendectomy. 5. Right-sided thyroidectomy. 6. C-section x2. 7. Hysterectomy. 8. Vitrectomy, right eye. 9. Symes amputation, left foot. 10.Below-the-knee amputation of the left  leg. 11.Cholecystectomy. 12.Thyroidectomy on the left side.  FAMILY HISTORY:  Mother died of stroke at age 60.  ALLERGIES:  None.  SOCIAL HISTORY:  She is a nonsmoker, nondrinker.  She lives with her husband.  MEDICATIONS: 1. Sliding scale insulin three times a day with meals 3 to 10 units. 2. Caltrate over-the-counter p.o. b.i.d. 3. Aspirin 81 mg p.o. daily. 4. Synthroid 150 mcg p.o. daily. 5. Prograf 2 mg in the morning and 1.5 mg in the evening. 6. Prednisone 5 mg p.o. daily. 7. Myfortic 360 mg p.o. b.i.d. 8. Multivitamin half tablet p.o. b.i.d. 9. Lipitor 10 mg p.o. every other day. 10.Lantus 9 units subcu daily.  PHYSICAL EXAMINATION:  VITAL SIGNS:  Temperature 97.9, pulse 52, respirations 17, sat 100%. GENERAL:  A well-developed woman in no acute distress.  She does have several episodes of vomiting during my examination. HEENT:  Head appears to be normal.  Eyes, sclerae clear with irises, lids, and conjunctive appear unremarkable.  Both pupils are eccentric. NECK:  Supple.  No lymphadenopathy or masses.  No thyromegaly. CHEST:  Clear to auscultation bilaterally.  No wheezes, rales, or rhonchi.  There is normal respiratory effort.  There is no right lower extremity edema. ENT: Lips appear unremarkable.  Hearing is grossly unremarkable. CARDIOVASCULAR:  Bradycardic.  Regular rhythm.  No murmur, rub, or gallop.  No  lower extremity edema on the right. ABDOMEN:  Soft, nontender, nondistended.  No masses are appreciated. EXTREMITIES:  She has a brace on the right leg secondary to a Charcot deformity of the foot.  The left lower extremity has been amputated below the knee and a prosthetic limb is in place.  Speech now is fluent and clear.  Tone and strength in the upper and lower extremities appears to be 5/5 and symmetric.  IMAGING: 1. CT of the head:  No acute intracranial abnormality. 2. Chest x-ray on November 02, 2010:  Stable cardiomegaly.  Question     small amount  of left pleural effusion.  Question atelectasis versus     infiltrate. 3. MRI of the brain:  Acute right PCA territory infarct involving the     involvement of the right hippocampus.  Evidence of right PCA     thrombus.  Patchy left cerebellar hemisphere acute infarct. 4. MRA of the brain November 02, 2011:  Occluded right PCA just beyond     its origin.  Remainder of posterior circulation appears within     normal limits.  Distal right vertebral artery is dominant.     Moderate right supraclinoid ICA stenosis in the setting of     bilateral cavernous ICA atherosclerosis. 5. CT angiogram of the head and neck is pending at this time.  PERTINENT LABORATORY STUDIES: 1. CBC notable for platelet count of 129.  She does have intermittent     thrombocytopenia by previous data points. 2. Coag studies are unremarkable. 3. Basic metabolic panel are notable for glucose of 268. 4. Hepatic function panel unremarkable. 5. One set of cardiac markers were negative.  One cardiac enzyme     troponin mildly positive.  Of note, the patient has a history of     mildly positive troponins and the cardiac marker was drawn     afterwards and was unremarkable.  Given her history of end-stage     renal disease and previously elevated troponins, this is most     certainly of no clinical significance.  She has not had any chest     pain or shortness of breath. 6. Urinalysis unremarkable.  EKG, marked sinus bradycardia with a rate of 49.  No acute abnormalities seen.  ASSESSMENT AND PLAN:  A 70 year old woman presents with acute infarcts. 1. 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 2. Left cerebellar acute infarct.  At this point, Neurology has     written stroke admission orders and we will defer to management of     the stroke to Neurology. 3. Abnormal chest x-ray.  The patient has no shortness of breath at     this point and I suspect her chest x-ray reflects atelectasis.  No     further evaluation of  this point. 4. Thrombocytopenia.  This appears to be stable and intermittent in     nature. 5. Diabetes mellitus.  This appears to be stable at this point.     Continue her on her Lantus and sliding scale insulin. 6. Spurious troponin.  Most likely this is spurious as described     above.  We will repeat cardiac enzymes x1 and if no significant     change, would not pursue further evaluation. 7. History of renal transplant.  Continue her medications for this. 8. Known orthostasis secondary to autonomic dysfunction.  This appears     to be stable at this point.     Brendia Sacks, MD  DG/MEDQ  D:  11/02/2010  T:  11/02/2010  Job:  [id number]  Electronically Signed by Brendia Sacks  on 11/02/2010 08:58:30 PM

## 2010-11-03 LAB — BASIC METABOLIC PANEL
CO2: 30 mEq/L (ref 19–32)
Chloride: 104 mEq/L (ref 96–112)
Glucose, Bld: 171 mg/dL — ABNORMAL HIGH (ref 70–99)
Potassium: 4.3 mEq/L (ref 3.5–5.1)
Sodium: 140 mEq/L (ref 135–145)

## 2010-11-03 LAB — CBC
HCT: 38.2 % (ref 36.0–46.0)
Hemoglobin: 12.2 g/dL (ref 12.0–15.0)
MCHC: 31.9 g/dL (ref 30.0–36.0)
RBC: 4.15 MIL/uL (ref 3.87–5.11)
WBC: 9.1 10*3/uL (ref 4.0–10.5)

## 2010-11-03 LAB — LIPID PANEL
HDL: 77 mg/dL (ref >39)
LDL Cholesterol: 36 mg/dL (ref 0–99)
Total CHOL/HDL Ratio: 1.7 RATIO
Triglycerides: 80 mg/dL (ref <150)
VLDL: 16 mg/dL (ref 0–40)

## 2010-11-03 LAB — GLUCOSE, CAPILLARY

## 2010-11-03 LAB — HEMOGLOBIN A1C
Hgb A1c MFr Bld: 6.5 % — ABNORMAL HIGH (ref <5.7)
Mean Plasma Glucose: 137 mg/dL — ABNORMAL HIGH (ref <117)

## 2010-11-04 DIAGNOSIS — I6789 Other cerebrovascular disease: Secondary | ICD-10-CM

## 2010-11-04 LAB — STOOL CULTURE

## 2010-11-04 LAB — RENAL FUNCTION PANEL
Albumin: 2.5 g/dL — ABNORMAL LOW (ref 3.5–5.2)
Albumin: 2.6 g/dL — ABNORMAL LOW (ref 3.5–5.2)
BUN: 33 mg/dL — ABNORMAL HIGH (ref 6–23)
Chloride: 104 mEq/L (ref 96–112)
Chloride: 108 mEq/L (ref 96–112)
Creatinine, Ser: 1.13 mg/dL (ref 0.4–1.2)
GFR calc Af Amer: 56 mL/min — ABNORMAL LOW (ref >60)
GFR calc non Af Amer: 47 mL/min — ABNORMAL LOW (ref >60)
Glucose, Bld: 206 mg/dL — ABNORMAL HIGH (ref 70–99)
Phosphorus: 2.2 mg/dL — ABNORMAL LOW (ref 2.3–4.6)
Phosphorus: 2.8 mg/dL (ref 2.3–4.6)
Potassium: 4.2 mEq/L (ref 3.5–5.1)
Potassium: 4.2 mEq/L (ref 3.5–5.1)
Sodium: 138 mEq/L (ref 135–145)

## 2010-11-04 LAB — BLOOD GAS, ARTERIAL
Acid-Base Excess: 2.1 mmol/L — ABNORMAL HIGH (ref 0.0–2.0)
Drawn by: 28283
O2 Content: 2 L/min
O2 Saturation: 91.8 %
Patient temperature: 98.6
pO2, Arterial: 60.5 mmHg — ABNORMAL LOW (ref 80.0–100.0)

## 2010-11-04 LAB — CLOSTRIDIUM DIFFICILE BY PCR: Toxigenic C. Difficile by PCR: NOT DETECTED

## 2010-11-04 LAB — DIFFERENTIAL
Basophils Absolute: 0 10*3/uL (ref 0.0–0.1)
Eosinophils Relative: 1 % (ref 0–5)
Lymphocytes Relative: 6 % — ABNORMAL LOW (ref 12–46)
Lymphs Abs: 0.7 10*3/uL (ref 0.7–4.0)
Neutro Abs: 10.4 10*3/uL — ABNORMAL HIGH (ref 1.7–7.7)

## 2010-11-04 LAB — COMPREHENSIVE METABOLIC PANEL
AST: 31 U/L (ref 0–37)
Albumin: 2.7 g/dL — ABNORMAL LOW (ref 3.5–5.2)
BUN: 53 mg/dL — ABNORMAL HIGH (ref 6–23)
CO2: 28 mEq/L (ref 19–32)
Calcium: 8.7 mg/dL (ref 8.4–10.5)
Creatinine, Ser: 1.48 mg/dL — ABNORMAL HIGH (ref 0.4–1.2)
GFR calc Af Amer: 42 mL/min — ABNORMAL LOW (ref >60)
GFR calc non Af Amer: 35 mL/min — ABNORMAL LOW (ref >60)

## 2010-11-04 LAB — URINE CULTURE
Colony Count: NO GROWTH
Culture  Setup Time: 201110031921
Culture: NO GROWTH
Special Requests: NEGATIVE

## 2010-11-04 LAB — GLUCOSE, CAPILLARY
Glucose-Capillary: 110 mg/dL — ABNORMAL HIGH (ref 70–99)
Glucose-Capillary: 114 mg/dL — ABNORMAL HIGH (ref 70–99)
Glucose-Capillary: 168 mg/dL — ABNORMAL HIGH (ref 70–99)
Glucose-Capillary: 178 mg/dL — ABNORMAL HIGH (ref 70–99)
Glucose-Capillary: 208 mg/dL — ABNORMAL HIGH (ref 70–99)
Glucose-Capillary: 374 mg/dL — ABNORMAL HIGH (ref 70–99)
Glucose-Capillary: 157 mg/dL — ABNORMAL HIGH (ref 70–99)
Glucose-Capillary: 235 mg/dL — ABNORMAL HIGH (ref 70–99)

## 2010-11-04 LAB — CULTURE, BLOOD (ROUTINE X 2): Culture  Setup Time: 201110032309

## 2010-11-04 LAB — CARDIAC PANEL(CRET KIN+CKTOT+MB+TROPI)
CK, MB: 1.5 ng/mL (ref 0.3–4.0)
Relative Index: INVALID (ref 0.0–2.5)
Troponin I: 0.07 ng/mL — ABNORMAL HIGH (ref 0.00–0.06)

## 2010-11-04 LAB — WOUND CULTURE

## 2010-11-04 LAB — TROPONIN I
Troponin I: 0.06 ng/mL (ref 0.00–0.06)
Troponin I: 0.14 ng/mL — ABNORMAL HIGH (ref 0.00–0.06)

## 2010-11-04 LAB — LIPASE, BLOOD: Lipase: 16 U/L (ref 11–59)

## 2010-11-04 LAB — CBC
HCT: 33.7 % — ABNORMAL LOW (ref 36.0–46.0)
Hemoglobin: 12.4 g/dL (ref 12.0–15.0)
MCH: 30.7 pg (ref 26.0–34.0)
MCHC: 32 g/dL (ref 30.0–36.0)
MCHC: 32.3 g/dL (ref 30.0–36.0)
MCV: 95 fL (ref 78.0–100.0)
MCV: 91.1 fL (ref 78.0–100.0)
Platelets: 135 10*3/uL — ABNORMAL LOW (ref 150–400)
Platelets: 145 10*3/uL — ABNORMAL LOW (ref 150–400)
Platelets: 153 10*3/uL (ref 150–400)
Platelets: 127 10*3/uL — ABNORMAL LOW (ref 150–400)
RBC: 4.11 MIL/uL (ref 3.87–5.11)
RBC: 4.26 MIL/uL (ref 3.87–5.11)
RDW: 13.3 % (ref 11.5–15.5)
RDW: 13.6 % (ref 11.5–15.5)
WBC: 7.2 10*3/uL (ref 4.0–10.5)
WBC: 9.3 10*3/uL (ref 4.0–10.5)
WBC: 6.3 10*3/uL (ref 4.0–10.5)

## 2010-11-04 LAB — CK TOTAL AND CKMB (NOT AT ARMC)
CK, MB: 1.6 ng/mL (ref 0.3–4.0)
CK, MB: 2.8 ng/mL (ref 0.3–4.0)
CK, MB: 3 ng/mL (ref 0.3–4.0)
Relative Index: 1.2 (ref 0.0–2.5)
Relative Index: 2.5 (ref 0.0–2.5)
Total CK: 68 U/L (ref 7–177)

## 2010-11-04 LAB — BASIC METABOLIC PANEL
Chloride: 103 mEq/L (ref 96–112)
GFR calc non Af Amer: 56 mL/min — ABNORMAL LOW (ref >60)
Glucose, Bld: 190 mg/dL — ABNORMAL HIGH (ref 70–99)
Potassium: 4.3 mEq/L (ref 3.5–5.1)
Sodium: 140 mEq/L (ref 135–145)

## 2010-11-04 LAB — MAGNESIUM: Magnesium: 1.8 mg/dL (ref 1.5–2.5)

## 2010-11-04 LAB — URINALYSIS, DIPSTICK ONLY
Leukocytes, UA: NEGATIVE
Protein, ur: NEGATIVE mg/dL
Urobilinogen, UA: 1 mg/dL (ref 0.0–1.0)

## 2010-11-04 LAB — MRSA PCR SCREENING: MRSA by PCR: NEGATIVE

## 2010-11-05 DIAGNOSIS — I6330 Cerebral infarction due to thrombosis of unspecified cerebral artery: Secondary | ICD-10-CM

## 2010-11-05 LAB — URINALYSIS, ROUTINE W REFLEX MICROSCOPIC
Bilirubin Urine: NEGATIVE
Glucose, UA: 100 mg/dL — AB
Hgb urine dipstick: NEGATIVE
Ketones, ur: 40 mg/dL — AB
Protein, ur: 100 mg/dL — AB
Urobilinogen, UA: 0.2 mg/dL (ref 0.0–1.0)

## 2010-11-05 LAB — BASIC METABOLIC PANEL
CO2: 30 mEq/L (ref 19–32)
Calcium: 9.2 mg/dL (ref 8.4–10.5)
Creatinine, Ser: 1.01 mg/dL (ref 0.4–1.2)
GFR calc non Af Amer: 54 mL/min — ABNORMAL LOW (ref >60)
Glucose, Bld: 238 mg/dL — ABNORMAL HIGH (ref 70–99)
Sodium: 136 mEq/L (ref 135–145)

## 2010-11-05 LAB — CBC
HCT: 38.2 % (ref 36.0–46.0)
MCHC: 33 g/dL (ref 30.0–36.0)
MCV: 91.2 fL (ref 78.0–100.0)
RDW: 13.2 % (ref 11.5–15.5)

## 2010-11-05 LAB — URINE MICROSCOPIC-ADD ON

## 2010-11-05 LAB — GLUCOSE, CAPILLARY
Glucose-Capillary: 212 mg/dL — ABNORMAL HIGH (ref 70–99)
Glucose-Capillary: 155 mg/dL — ABNORMAL HIGH (ref 70–99)

## 2010-11-05 NOTE — Consult Note (Signed)
[name], [name]            ACCOUNT NO.:  [id number]  MEDICAL RECORD NO.:  [id number]           PATIENT TYPE:  I  LOCATION:  3013                         FACILITY:  [facility]  PHYSICIAN:  Pramod P. Pearlean Brownie, MD    DATE OF BIRTH:  05-Dec-1940  DATE OF CONSULTATION: DATE OF DISCHARGE:                                CONSULTATION   REFERRING PHYSICIAN:  Zannie Cove, MD  REASON FOR REFERRAL:  Stroke.  HISTORY OF PRESENT ILLNESS:  Elizabeth [name] is 70 year old Caucasian lady who woke up yesterday from sleep with headache, nausea, vomiting, noted some left-sided vision difficulties, as well as weakness.  She felt dizzy, lightheaded.  She normally walks with a prosthesis, because of her diabetic amputation, but had trouble walking yesterday.  She presented to [facility] Room where CT scan of the head did not show any acute abnormality, however, subsequently an MRI scan was obtained later on the day which showed an acute large right posterior cerebral artery infarct involving the medial portion of the temporal lobe and MRA showed abrupt occlusion of the right posterior cerebral artery at its origin.  There was also small left cerebellar white matter infarct as well.  The patient has no prior history of stroke, TIAs, or seizures.  PAST MEDICAL HISTORY:  Significant for diabetes, coronary artery disease, hyperlipidemia, peripheral vascular disease, end-stage disease with diabetic neuropathy, history of renal transplant, diabetic retinopathy and neuropathy and nephropathy.  Osteoporosis, hypothyroidism, skin cancer.  PAST SURGICAL HISTORY:  Cataract removal, below-knee amputation on the left.  MEDICATIONS AT HOME:  Lantus, Prograf, prednisone, Myfortic, Synthroid, Lipitor, aspirin, and calcium.  No medication allergies, none listed.  FAMILY HISTORY:  Noncontributory.  SOCIAL HISTORY:  She is married, lives with her husband.  She does not smoke or drink.  She is a  retired Engineer, civil (consulting).  REVIEW OF SYSTEMS:  Positive for headache, nausea, vomiting, dizziness, imbalance, and numbness in the feet.  PHYSICAL EXAMINATION:  GENERAL/VITAL SIGNS:  Reveals a pleasant, middle- aged Caucasian lady who is currently not in distress.  She is afebrile with pulse rate of 58 per minute, regular, respiratory rate is 18 per minute, oxygen sats 100%, blood pressure is 185/75.  She has left below- knee amputation and prosthetic foot.  She is wearing a brace in the right leg. HEAD:  Nontraumatic. NECK:  Supple.  There is no bruit. ENT:  Hearing appears normal. CARDIAC:  No murmur or gallop. LUNGS:  Clear to auscultation. NEUROLOGICAL:  She is awake, alert, and oriented to time, place, and person.  The speech and language appear normal.  She follows commands well.  She has right gaze preference, but is able to look to the left. She has dense left homonymous hemianopsia.  There is no facial weakness by my exam.  Tongue is midline.  Motor system exam reveals no upper or lower extremity drift.  She has weakness of the left grip and intrinsic hand muscles.  She orbits right over left upper extremity.  Left lower extremity exam is limited due to amputation.  She has dense left-sided sensory loss and mild left-sided neglect.  NIH stroke scale, she scored  5.  Gait was not tested.  DATA REVIEWED:  MRI scan findings as stated above.  CT angiogram done today shows mild 48% right vertebral and 50% distal left carotid stenosis.  There is no significant extracranial stenosis or vertebral origin stenosis.  The right posterior cerebral arteries occluded at this origin.  Hemoglobin A1c is pending.  Total cholesterol is 129, triglycerides 80, HDL 77, LDL 36 mg percent.  Electrolytes and CBC are normal.  IMPRESSION:  A 70 year old lady with embolic right posterior cerebral artery and small left cerebellar infarcts without definite vessel thromboembolism.  There is a high likelihood of  cardiogenic source.  PLAN:  I would recommend to continue telemetry monitoring as well as check transesophageal echocardiogram for cardiac source of embolism. Continue Plavix for now.  Physical, occupational, and speech therapy consults.  The patient will benefit with referral to inpatient rehab. To treat headache symptomatically with a Depacon 1 g IV, now followed by Depakote ER 500 mg once a day.  I had a long discussion with the patient and her husband in regards to her neurological presentation findings of the test done and plan for further evaluation and treatment, and answered questions.     Pramod P. Pearlean Brownie, MD     PPS/MEDQ  D:  11/03/2010  T:  11/04/2010  Job:  [id number]  Electronically Signed by Delia Heady MD on 11/05/2010 11:20:43 AM

## 2010-11-06 ENCOUNTER — Inpatient Hospital Stay (HOSPITAL_COMMUNITY): Payer: Medicare Other

## 2010-11-06 LAB — CARDIAC PANEL(CRET KIN+CKTOT+MB+TROPI)
CK, MB: 1.3 ng/mL (ref 0.3–4.0)
Relative Index: INVALID (ref 0.0–2.5)
Total CK: 33 U/L (ref 7–177)
Troponin I: 0.15 ng/mL — ABNORMAL HIGH (ref 0.00–0.06)

## 2010-11-06 LAB — BASIC METABOLIC PANEL
BUN: 25 mg/dL — ABNORMAL HIGH (ref 6–23)
CO2: 32 mEq/L (ref 19–32)
Calcium: 9.8 mg/dL (ref 8.4–10.5)
Chloride: 100 mEq/L (ref 96–112)
Creatinine, Ser: 1.21 mg/dL — ABNORMAL HIGH (ref 0.4–1.2)
GFR calc Af Amer: 53 mL/min — ABNORMAL LOW (ref >60)
GFR calc non Af Amer: 44 mL/min — ABNORMAL LOW (ref >60)
Glucose, Bld: 118 mg/dL — ABNORMAL HIGH (ref 70–99)
Potassium: 3.9 mEq/L (ref 3.5–5.1)
Sodium: 139 mEq/L (ref 135–145)

## 2010-11-06 LAB — GLUCOSE, CAPILLARY
Glucose-Capillary: 98 mg/dL (ref 70–99)
Glucose-Capillary: 126 mg/dL — ABNORMAL HIGH (ref 70–99)
Glucose-Capillary: 148 mg/dL — ABNORMAL HIGH (ref 70–99)
Glucose-Capillary: 95 mg/dL (ref 70–99)

## 2010-11-06 LAB — TSH: TSH: 0.11 u[IU]/mL — ABNORMAL LOW (ref 0.350–4.500)

## 2010-11-06 LAB — MAGNESIUM: Magnesium: 1.7 mg/dL (ref 1.5–2.5)

## 2010-11-07 ENCOUNTER — Inpatient Hospital Stay (HOSPITAL_COMMUNITY): Payer: Medicare Other

## 2010-11-07 LAB — MRSA PCR SCREENING: MRSA by PCR: NEGATIVE

## 2010-11-07 LAB — CARDIAC PANEL(CRET KIN+CKTOT+MB+TROPI)
CK, MB: 1.8 ng/mL (ref 0.3–4.0)
CK, MB: 1.6 ng/mL (ref 0.3–4.0)
Relative Index: INVALID (ref 0.0–2.5)
Relative Index: INVALID (ref 0.0–2.5)
Total CK: 29 U/L (ref 7–177)
Total CK: 28 U/L (ref 7–177)
Troponin I: 0.3 ng/mL — ABNORMAL HIGH (ref 0.00–0.06)
Troponin I: 0.21 ng/mL — ABNORMAL HIGH (ref 0.00–0.06)

## 2010-11-07 LAB — GLUCOSE, CAPILLARY: Glucose-Capillary: 122 mg/dL — ABNORMAL HIGH (ref 70–99)

## 2010-11-08 ENCOUNTER — Inpatient Hospital Stay (HOSPITAL_COMMUNITY): Payer: Medicare Other

## 2010-11-08 DIAGNOSIS — I495 Sick sinus syndrome: Secondary | ICD-10-CM

## 2010-11-08 LAB — CBC
HCT: 36.6 % (ref 36.0–46.0)
MCHC: 32.8 g/dL (ref 30.0–36.0)
Platelets: 131 10*3/uL — ABNORMAL LOW (ref 150–400)
RDW: 13.1 % (ref 11.5–15.5)
WBC: 7.8 10*3/uL (ref 4.0–10.5)

## 2010-11-08 LAB — GLUCOSE, CAPILLARY
Glucose-Capillary: 111 mg/dL — ABNORMAL HIGH (ref 70–99)
Glucose-Capillary: 103 mg/dL — ABNORMAL HIGH (ref 70–99)
Glucose-Capillary: 116 mg/dL — ABNORMAL HIGH (ref 70–99)
Glucose-Capillary: 188 mg/dL — ABNORMAL HIGH (ref 70–99)
Glucose-Capillary: 205 mg/dL — ABNORMAL HIGH (ref 70–99)

## 2010-11-08 LAB — URINALYSIS, ROUTINE W REFLEX MICROSCOPIC
Nitrite: POSITIVE — AB
Protein, ur: 100 mg/dL — AB
Specific Gravity, Urine: 1.012 (ref 1.005–1.030)
Urobilinogen, UA: 0.2 mg/dL (ref 0.0–1.0)

## 2010-11-08 LAB — BLOOD GAS, ARTERIAL
Acid-Base Excess: 1.9 mmol/L (ref 0.0–2.0)
FIO2: 0.35 %
Patient temperature: 98.6
TCO2: 27.6 mmol/L (ref 0–100)
pCO2 arterial: 43.5 mmHg (ref 35.0–45.0)
pH, Arterial: 7.398 (ref 7.350–7.400)

## 2010-11-08 LAB — BASIC METABOLIC PANEL
Calcium: 8.6 mg/dL (ref 8.4–10.5)
GFR calc Af Amer: >60 mL/min (ref >60)
GFR calc non Af Amer: 53 mL/min — ABNORMAL LOW (ref >60)
Potassium: 3.8 mEq/L (ref 3.5–5.1)
Sodium: 137 mEq/L (ref 135–145)

## 2010-11-08 LAB — URINE MICROSCOPIC-ADD ON

## 2010-11-08 LAB — LIPASE, BLOOD: Lipase: 15 U/L (ref 11–59)

## 2010-11-08 LAB — T4, FREE: Free T4: 1.85 ng/dL — ABNORMAL HIGH (ref 0.80–1.80)

## 2010-11-09 LAB — BASIC METABOLIC PANEL
CO2: 24 mEq/L (ref 19–32)
Calcium: 8.6 mg/dL (ref 8.4–10.5)
Creatinine, Ser: 1.26 mg/dL — ABNORMAL HIGH (ref 0.4–1.2)
GFR calc Af Amer: 51 mL/min — ABNORMAL LOW (ref >60)
GFR calc non Af Amer: 42 mL/min — ABNORMAL LOW (ref >60)
Sodium: 136 mEq/L (ref 135–145)

## 2010-11-09 LAB — CBC
MCH: 29.7 pg (ref 26.0–34.0)
MCHC: 32.9 g/dL (ref 30.0–36.0)
Platelets: 117 10*3/uL — ABNORMAL LOW (ref 150–400)
RDW: 13.1 % (ref 11.5–15.5)

## 2010-11-09 LAB — URINE CULTURE
Colony Count: >=100000
Culture  Setup Time: 201203162135

## 2010-11-09 LAB — GLUCOSE, CAPILLARY
Glucose-Capillary: 221 mg/dL — ABNORMAL HIGH (ref 70–99)
Glucose-Capillary: 151 mg/dL — ABNORMAL HIGH (ref 70–99)

## 2010-11-10 ENCOUNTER — Inpatient Hospital Stay (HOSPITAL_COMMUNITY): Payer: Medicare Other

## 2010-11-10 LAB — CBC
HCT: 34.6 % — ABNORMAL LOW (ref 36.0–46.0)
Hemoglobin: 11.3 g/dL — ABNORMAL LOW (ref 12.0–15.0)
MCH: 28.9 pg (ref 26.0–34.0)
MCHC: 32.7 g/dL (ref 30.0–36.0)
MCV: 88.5 fL (ref 78.0–100.0)
RDW: 13 % (ref 11.5–15.5)

## 2010-11-10 LAB — GLUCOSE, CAPILLARY
Glucose-Capillary: 192 mg/dL — ABNORMAL HIGH (ref 70–99)
Glucose-Capillary: 230 mg/dL — ABNORMAL HIGH (ref 70–99)

## 2010-11-10 LAB — URINE CULTURE: Colony Count: >=100000

## 2010-11-10 LAB — BASIC METABOLIC PANEL
BUN: 27 mg/dL — ABNORMAL HIGH (ref 6–23)
CO2: 25 mEq/L (ref 19–32)
Calcium: 8.6 mg/dL (ref 8.4–10.5)
GFR calc non Af Amer: 52 mL/min — ABNORMAL LOW (ref >60)
Glucose, Bld: 226 mg/dL — ABNORMAL HIGH (ref 70–99)

## 2010-11-10 LAB — CULTURE, BLOOD (ROUTINE X 2)

## 2010-11-10 NOTE — Discharge Summary (Signed)
[name], [name]            ACCOUNT NO.:  [id number]  MEDICAL RECORD NO.:  [id number]          PATIENT TYPE:  INP  LOCATION:  6743                         FACILITY:  [facility]  PHYSICIAN:  Terrial Rhodes, M.D.DATE OF BIRTH:  April 20, 1941  DATE OF ADMISSION:  05/24/2010 DATE OF DISCHARGE:  05/27/2010                              DISCHARGE SUMMARY   DISCHARGE DIAGNOSES: 1. Nausea, vomiting, and diarrhea. 2. Left lower lobe pneumonia. 3. Hypotension. 4. Acute kidney injury 5. 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 6. Pancreatic cystadenoma. 7. Type 2 diabetes mellitus. 8. Coronary artery disease. 9. Anemia. 10.Hypothyroidism. 11.Peripheral vascular disease. 12.Methicillin-resistant Staphylococcus aureus facial lesion.  CONSULTATIONS: 1. Iva Boop, MD,FACG 2. Marca Ancona, MD  PROCEDURES:  A.  CT scan of the abdomen and pelvis without contrast, May 25, 2010. 1. Left lower lobe pneumonia. 2. Bladder distention with increased volume of fluid in the     transplanted renal pelvis with potentially mild hydro, recommend     Foley catheter.  These findings could represent reflux into the     transplant kidney. 3. No evidence of bowel obstruction. 4. Stable cystic lesion within the pancreas.     a.     2-D echocardiogram, May 27, 2010:  Ejection fraction 50-      55%, systolic function normal, aortic valve mild regurgitation,      mitral valve calcified annulus, mild regurgitation, left atrium      mildly dilated, Dr. [name].  HISTORY OF PRESENT ILLNESS:  Elizabeth [name] is a 70 year old white female with past medical history significant for end-stage renal disease secondary to diabetic nephropathy status post cadaveric transplant, March 15, 2000 at [facility] in [place], [place].  She has had excellent graft function with baseline  creatinine of 0.9-1 maintained on Prograf, Myfortic, and prednisone.  She unfortunately has had issues with insulin requiring diabetes and peripheral vascular disease.  She is status post left BKA and also having issues with pancreatic cystadenoma and is followed by Dr. Stan Head.  She was brought to our office today with 24-hour history of nausea, vomiting, inability to hold anything down, fever and malaise.  She is very weak and somnolent at home which is not like herself.  This came on rather suddenly starting Sunday but continued today.  She was brought in urgently to our office and was found to be hypotensive with blood pressure 80/40 with general malaise.  She is admitted for IV fluid resuscitation.  She denies any hematochezia, melena, or bright red blood per rectum.  No abdominal pain or persistent nausea.  LABORATORY DATA:  Labs drawn upon admission to the hospital.  Hemoglobin 10.5, white count 11.8, hematocrit 32.5, platelets 135,000, 88% neutrophils, 6% lymphocytes, and 5% monocytes.  Sodium 143, potassium 3.7, chloride 104, CO2 28, glucose 111, BUN 53, and creatinine 1.48. Albumin 2.7, AST 31, ALT 32, alkaline phosphatase 57, total bilirubin 1.3, magnesium 1.8, phosphorus 2.9, amylase 26, and lipase 16.  CK 135, MB 1.6, and troponin 0.06.  Urinalysis significant for  15 ketones, negative nitrites, negative leukocyte esterase, and negative protein.  HOSPITAL COURSE: 1. Nausea, vomiting and diarrhea.  The patient was placed on IV     fluids.  Stool was checked for C-diff and found to be negative.     She was pancultured with no growth on urine or blood cultures.     Abdominal series was unremarkable except for left lower lobe     consolidation.  She was continued on her usual medications.  She     was also noted to have a skin lesion which was cultured and found     to be positive for MRSA of her nares.  She was started on empiric     vancomycin and Zosyn as well as local  Bactroban to the wound in     nares. 2. Left lower lobe pneumonia.  This was treated initially with     vancomycin and Zosyn as described in #1 awaiting final cultures all     of which were negative.  She gradually improved and was discharged     on Avelox for final antibiotic treatment. 3. Orthostatic hypotension.  This is a problem at baseline due to     autonomic dysfunction and was made markedly worse due to her     dehydration and this was corrected with IV fluids. 4. Acute kidney injury.  Essentially, this was due to nausea,     vomiting, and diarrhea contributing to hypotensive causing an acute     kidney injury with rehydration.  Renal function improved and     creatinine was down to 1.15 with a BUN of 28 at the time of     discharge.  She is to follow up with Dr. Arrie Aran on June 07, 2010. 5. Renal transplant secondary to end-stage renal disease secondary to     diabetes mellitus.  She was given stress doses of steroids and     placed on a rapid taper.  There were no changes in her transplant     medications. 6. Pancreatic cystadenoma.  GI was consulted due to her GI symptoms     and also pancreatic cystadenoma.  Abdominal CT was done.  She was     treated conservatively and will follow up with Dr. Leone Payor on     July 08, 2010. 7. Type 2 diabetes mellitus.  Blood sugars were monitored closely with     sliding scale insulin coverage as needed.  There were no changes in     this prescription. 8. Anemia.  Hemoglobin actually increased during her hospitalization,     even with rehydration and was up to 12.2 at the time of discharge. 9. MRSA facial lesion.  As mentioned before, this culture was positive     for MRSA and she received short-term vancomycin empirically for her     pneumonia which covered this and was also discharged on Bactroban     as per discharge instructions. 10.Other medical issues were stable during this hospitalization.  CONDITION ON DISCHARGE:   Improved.  The patient is discharged to home with the care of her husband.  DISCHARGE DIET:  Low-sodium, heart-healthy.  WOUND CARE:  Bactroban to the facial lesion twice a day for a week.  ACTIVITIES:  Increase slowly.  FOLLOWUP APPOINTMENTS:  Dr. Arrie Aran, June 07, 2010 and Dr. Leone Payor, July 08, 2010.  DISCHARGE MEDICATIONS: 1. Aspirin 81 mg per day. 2. Avelox 400 mg per day until prescription complete. 3. Bactroban  2% ointment nasally and facial lesion b.i.d. 4. Prednisone 20 mg daily, Friday and Saturday 10 mg daily, Sunday and     Monday and return to usual prednisone dose of 5 mg Tuesday. 5. Caltrate plus D over-the-counter 2 tablets daily. 6. Humalog insulin 10 units subcu 3 times a day with meals as needed. 7. Lantus 10 units subcu daily. 8. Lipitor 10 mg every other day. 9. Therapeutic multivitamin 1/2 tablet twice a day. 10.Myfortic 360 mg 1 capsule b.i.d. 11.Prograf 0.5 mg 3 capsules in the morning and 4 capsules at night     day. 12.Synthroid 150 mcg 1 tablet every a.m.  The patient was instructed to stop enteric-coated aspirin 81 mg per day, meclizine 50 mg per day, and Oxybutynin gel transdermal daily.  TIME SPENT:  45 minutes have been spent completing the patient's discharge medication list, reviewing discharge instructions with the patient, and dictating discharge summary.     Weston Settle, P.A.C.   ______________________________ Terrial Rhodes, M.D.    MB/MEDQ  D:  10/13/2010  T:  10/14/2010  Job:  [id number]  cc:   Iva Boop, MD,FACG Marca Ancona, MD  Electronically Signed by Weston Settle P.A. on 10/18/2010 10:17:25 PM Electronically Signed by Terrial Rhodes M.D. on 11/10/2010 01:57:36 PM

## 2010-11-11 LAB — GLUCOSE, CAPILLARY
Glucose-Capillary: 119 mg/dL — ABNORMAL HIGH (ref 70–99)
Glucose-Capillary: 163 mg/dL — ABNORMAL HIGH (ref 70–99)

## 2010-11-11 LAB — PROTIME-INR: Prothrombin Time: 15.2 seconds (ref 11.6–15.2)

## 2010-11-11 NOTE — Consult Note (Signed)
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  Modified Rankin scale of 2.  NIH stroke scale of 3.  Time of neurological exam was 2:30 p.m. on November 02, 2010.  HISTORY OF PRESENT ILLNESS:  This is a 70 year old Caucasian female with diabetes, hyperlipidemia, hypothyroidism, renal transplant with baseline creatinine 0.9-1.0, below-knee amputation, CABG, C-section, cataract surgery, thyroidectomy, left lower lobe pneumonia, anemia, peripheral vascular disease, and TIAs.  The patient states that she was at her baseline on the night of November 01, 2010, went to sleep at approximately 11 p.m.  The patient woke at approximately 4 a.m. in the morning on November 02, 2010.  At that time, she felt slightly dizzy but could not pinpoint what was different.  She went back to sleep after going to the bathroom and woke at approximately 7:30 a.m.  Upon awakening, she felt nauseous.  The patient's husband helped to pry her bilateral prostatic, and as she went to stand she felt very off balance.  Although she has difficulty with her gait at baseline and needed assistance, she felt she has significantly more imbalance than usual.  The patient went about her morning business with help, took her medications, and after approximately 2 hours discussed with the primary care physician her symptoms.  She was instructed to come to [facility] ED to get further workup. The patient presented to [facility] ED where CT of her head was negative.  However, MRI of brain did show acute right PCA territory infarct including infarct of the right hippocampus along  with evidence of right PCA thrombus, in addition patchy left cerebellar hemisphere acute infarct.  At the time that I saw the patient in the emergency department, the patient feels as though her symptoms of dysarthria and imbalance had improved if not subsided completely.  The patient states that she does take aspirin 81 mg daily and does not miss a day.  She is already on Lipitor for hyperlipidemia.  PAST MEDICAL HISTORY: 1. End-stage renal disease secondary to diabetic neuropathy, started     dialysis in May 1996, status post cadaveric kidney placement on     August 15, 2000, with excellent graft function, baseline     creatinine 0.91 on Prograf, Myfortic, and prednisone. 2. Diabetes complicated with retinopathy, neuropathy, and nephropathy. 3. Coronary artery disease status post MI and CABG, one vessel, in     December 2001. 4. Orthostatic hypotension due to autonomic dysfunction. 5. Refractory osteoporosis. 6. Hypothyroidism. 7. Peripheral vascular disease status post left BKA and right BKA. 8. Dyslipidemia. 9. Left distal radius fracture. 10.Skin cancer. 11.Diabetic retinopathy. 12.Cataract removal. 13.Multiple cysts in pancreas.  STROKE RISK FACTORS:  Obesity, hyperlipidemia, hypertension, diabetes.  MEDICATIONS:  The patient's home medications include  Lantus, Prograf, prednisone, Myfortic, Synthroid, Lipitor, aspirin 81 mg daily, and calcium.  ALLERGIES:  The patient has no known allergies to drugs or environmental factors.  FAMILY HISTORY:  She does not know anything about her biological father. Her mother died of stroke at 59.  She has three sisters, one which has breast cancer and two had molar pregnancies.  No other family history of intracranial abnormalities.  SOCIAL HISTORY:  The patient is married and lives with her husband of 47 years.  They have two children, Elizabeth [name], age 63, and Elizabeth [name], age 33, both are alive and well.  She denies any alcohol or tobacco  abuse or drug abuse.  She is a retired [employer] school.  REVIEW OF SYSTEMS:  Positive for nausea, vomiting, imbalance, dizziness, bilateral paresthesias of the lower extremities, otherwise negative with the exception of 10 systems above.  PHYSICAL EXAMINATION:  VITAL SIGNS:  Temperature is 97.7, heart rate 75, blood pressure 185/75, respirations 16, O2 sat on room air. LUNGS:  Clear to auscultation bilaterally. NECK:  Negative for bruits and supple. CARDIOVASCULAR:  Regular rate and rhythm. ABDOMEN:  Soft, nontender, nondistended. SKIN:  Warm, dry, and intact. HEENT:  Bilateral pupils are irregular secondary to iridectomy, however, they are responsive, approximately 3 mm to 2 mm.  The patient's vision is intact; however, at baseline she states that she has decreased peripheral vision, especially on the left. NEUROLOGIC:  The patient's facial sensation is grossly intact.  I do note a slight left facial droop, but her speech is clear and well enunciated.  The patient passed her stroke swallow screen and states that she has no dysphasia.  The patient shows no drift in her upper or lower extremities.  Her strength is 5/5.  The patient shows smooth finger-to-nose and bilateral upper extremities.  She states that her sensation is intact bilaterally to pinprick and light touch.  TEST RESULTS:  Sodium is 143, potassium 4.1, chloride 105, CO2 is 29, BUN 24, creatinine 0.9, glucose is high at 268.  White blood cell count 5.5, platelets are 129,000, hemoglobin is 12.6, hematocrit is 39.1.  PT is 13.6, INR is 1.38.  EKG shows bradycardia, but no AFib.  CT scan as stated above.  MRI as stated above.  A 2-D echo is pending.  Carotid Doppler pending.  CT angio of head and neck are pending.  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  Etiology is most  likely artery to artery in the posterior circulation; however, cannot rule out cardioembolic at this time.  I have discussed this patient with Dr. Hoy Morn, and at this time his recommendations are as follows: 1. Continue with stroke workup including 2-D echo, carotid Dopplers. 2. Start the patient on Plavix and discontinue aspirin. 3. Obtain a CT angio of head and neck to further investigate the     vessels. 4. If 2-D echo is negative, consider TEE.  Stroke MD will follow in the morning to make further recommendations.     Felicie Morn, PA-C   _I have discussed this patient and have examined as well. I agree with above clinical findings_____________________________ Levie Heritage, MD    DS/MEDQ  D:  11/02/2010  T:  11/03/2010  Job:  [id number]  cc:   Pramod P. Pearlean Brownie, MD  Electronically Signed by Felicie Morn PA-C on 11/10/2010 01:03:22 PM Electronically Signed by Levie Heritage MD on 11/11/2010 09:01:07  AM

## 2010-11-12 DIAGNOSIS — I6350 Cerebral infarction due to unspecified occlusion or stenosis of unspecified cerebral artery: Secondary | ICD-10-CM

## 2010-11-12 DIAGNOSIS — R609 Edema, unspecified: Secondary | ICD-10-CM

## 2010-11-12 LAB — GLUCOSE, CAPILLARY
Glucose-Capillary: 97 mg/dL (ref 70–99)
Glucose-Capillary: 215 mg/dL — ABNORMAL HIGH (ref 70–99)
Glucose-Capillary: 124 mg/dL — ABNORMAL HIGH (ref 70–99)

## 2010-11-12 LAB — PROTIME-INR: INR: 1.89 — ABNORMAL HIGH (ref 0.00–1.49)

## 2010-11-13 LAB — GLUCOSE, CAPILLARY: Glucose-Capillary: 124 mg/dL — ABNORMAL HIGH (ref 70–99)

## 2010-11-13 LAB — BASIC METABOLIC PANEL
CO2: 29 mEq/L (ref 19–32)
Chloride: 104 mEq/L (ref 96–112)
GFR calc Af Amer: >60 mL/min (ref >60)
Potassium: 3.9 mEq/L (ref 3.5–5.1)

## 2010-11-13 LAB — PROTIME-INR
INR: 2.84 — ABNORMAL HIGH (ref 0.00–1.49)
Prothrombin Time: 29.9 seconds — ABNORMAL HIGH (ref 11.6–15.2)

## 2010-11-14 LAB — GLUCOSE, CAPILLARY: Glucose-Capillary: 161 mg/dL — ABNORMAL HIGH (ref 70–99)

## 2010-11-14 LAB — CULTURE, BLOOD (ROUTINE X 2)

## 2010-11-14 LAB — PROTIME-INR
INR: 2.34 — ABNORMAL HIGH (ref 0.00–1.49)
Prothrombin Time: 25.8 seconds — ABNORMAL HIGH (ref 11.6–15.2)

## 2010-11-14 LAB — HEPARIN LEVEL (UNFRACTIONATED): Heparin Unfractionated: <0.1 IU/mL — ABNORMAL LOW (ref 0.30–0.70)

## 2010-11-15 LAB — CBC
Platelets: 176 10*3/uL (ref 150–400)
RBC: 3.91 MIL/uL (ref 3.87–5.11)
RDW: 12.7 % (ref 11.5–15.5)
WBC: 7.2 10*3/uL (ref 4.0–10.5)

## 2010-11-15 LAB — GLUCOSE, CAPILLARY
Glucose-Capillary: 40 mg/dL — CL (ref 70–99)
Glucose-Capillary: 71 mg/dL (ref 70–99)
Glucose-Capillary: 60 mg/dL — ABNORMAL LOW (ref 70–99)
Glucose-Capillary: 93 mg/dL (ref 70–99)
Glucose-Capillary: 203 mg/dL — ABNORMAL HIGH (ref 70–99)
Glucose-Capillary: 258 mg/dL — ABNORMAL HIGH (ref 70–99)
Glucose-Capillary: 107 mg/dL — ABNORMAL HIGH (ref 70–99)

## 2010-11-15 LAB — BASIC METABOLIC PANEL
Calcium: 9.2 mg/dL (ref 8.4–10.5)
Creatinine, Ser: 1.02 mg/dL (ref 0.4–1.2)
GFR calc Af Amer: >60 mL/min (ref >60)
GFR calc non Af Amer: 53 mL/min — ABNORMAL LOW (ref >60)
Sodium: 140 mEq/L (ref 135–145)

## 2010-11-15 LAB — PROTIME-INR: Prothrombin Time: 24.9 seconds — ABNORMAL HIGH (ref 11.6–15.2)

## 2010-11-16 ENCOUNTER — Encounter: Payer: Medicare Other | Admitting: Internal Medicine

## 2010-11-16 LAB — COMPREHENSIVE METABOLIC PANEL
ALT: 13 U/L (ref 0–35)
Alkaline Phosphatase: 37 U/L — ABNORMAL LOW (ref 39–117)
BUN: 11 mg/dL (ref 6–23)
CO2: 30 mEq/L (ref 19–32)
GFR calc non Af Amer: 58 mL/min — ABNORMAL LOW (ref >60)
Glucose, Bld: 124 mg/dL — ABNORMAL HIGH (ref 70–99)
Potassium: 4 mEq/L (ref 3.5–5.1)
Sodium: 136 mEq/L (ref 135–145)
Total Bilirubin: 0.4 mg/dL (ref 0.3–1.2)
Total Protein: 4.4 g/dL — ABNORMAL LOW (ref 6.0–8.3)

## 2010-11-16 LAB — CBC
HCT: 35.8 % — ABNORMAL LOW (ref 36.0–46.0)
Hemoglobin: 11.7 g/dL — ABNORMAL LOW (ref 12.0–15.0)
MCV: 89.5 fL (ref 78.0–100.0)
RDW: 12.5 % (ref 11.5–15.5)
WBC: 6.2 10*3/uL (ref 4.0–10.5)

## 2010-11-16 LAB — GLUCOSE, CAPILLARY
Glucose-Capillary: 128 mg/dL — ABNORMAL HIGH (ref 70–99)
Glucose-Capillary: 123 mg/dL — ABNORMAL HIGH (ref 70–99)
Glucose-Capillary: 230 mg/dL — ABNORMAL HIGH (ref 70–99)

## 2010-11-16 LAB — PROTIME-INR: Prothrombin Time: 30.9 seconds — ABNORMAL HIGH (ref 11.6–15.2)

## 2010-11-17 NOTE — Consult Note (Signed)
[name], [name]            ACCOUNT NO.:  [id number]  MEDICAL RECORD NO.:  [id number]           PATIENT TYPE:  I  LOCATION:  3112                         FACILITY:  [facility]  PHYSICIAN:  Noralyn Pick. Eden Emms, MD, FACCDATE OF BIRTH:  08/03/1941  DATE OF CONSULTATION:  11/08/2010 DATE OF DISCHARGE:                                CONSULTATION   PRIMARY CARE PHYSICIAN:  Terrial Rhodes, MD  PRIMARY CARDIOLOGIST:  Bevelyn Buckles. Bensimhon, MD in [place].  CHIEF COMPLAINT:  Tachybrady.  HISTORY OF PRESENT ILLNESS:  Elizabeth [name] is a 70 year old female with a history of coronary artery disease.  She has no history of significant arrhythmias.  She was admitted on November 02, 2010, with a stroke which had a hemorrhagic conversion and other infarcts which had progressed according to her most recent CT.  She has been in and out of atrial fib and flutter with possible SVT versus atrial tachycardia as well.  Last p.m., she was on a Cardizem drip which was discontinued secondary to sinus bradycardia after she converted.  Her heart rate was in the 40s and dropped occasionally in the 30s.  Cardiology was asked to evaluate her.  [name] responses to loud verbal and touch.  She moves her head and there is some arm movement, but she is not clearly oriented and not clearly responding to questions.  She answers yes to her mouth being dry and denied pain.  Information was obtained from chart notes, staff and her husband who is present.  She does not appear to be in acute distress.  She has not had any recent complaints of chest pain or palpitations.  According to her husband, her resting heart rate is typically in the 50s.  For this reason, she was not on a beta-blocker prior to admission.  PAST MEDICAL HISTORY: 1. Status post aortocoronary bypass surgery in 2001, with SVG to     circumflex. 2. 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 3.  Diabetes with nephropathy, retinopathy and neuropathy. 4. PVD with a Charcot joint, status post left BKA. 5. History of orthostatic hypotension. 6. Hypertension. 7. Hyperlipidemia. 8. Anemia. 9. Hypothyroidism. 10.Pancreatic cyst. 11.Osteoporosis. 12.Remote history of melanoma. 13.History of left carotid bruit with no significant cerebrovascular     disease by ultrasound. 14.Preserved left ventricular function with an EF of 55-60% by     echocardiogram November 04, 2010, and no PFO or shunt by bubble study. 15.History of pneumonia in October 2011. 16.History of colon adenoma. 17.History of intraductal papillary neoplasm of the pancreas. 18.History of gastroesophageal reflux disease and gastritis.  SURGICAL HISTORY:  She is status post renal transplant and bypass surgery in 2001 as well as left BKA, appendectomy, cataracts, cholecystectomy, hysterectomy, thyroid surgery, tonsillectomy, C-section x2 and vitrectomy.  She has also had EGD and colonoscopy.  ALLERGIES:  No known drug allergies.  CURRENT MEDICATIONS: 1. Atropine p.r.n. 2. Diltiazem drip at 15 mg per hour is discontinued. 3. Synthroid 75 mcg IV daily. 4. Multivitamin daily. 5. Myfortic b.i.d. 6. Zosyn q.8 h. 7. Prednisone 5 mg a day. 8. Prograf 2 mg daily  a.m. and 1.5 mg p.m. 9. Topamax 50 mg daily. 10.Valproate 500 mg IV q.12 h. 11.Vancomycin IV.  SOCIAL HISTORY:  [place]. She is a retired Engineer, civil (consulting).  She has no history of alcohol, tobacco or drug abuse.  Her activity was limited by her BKA.  FAMILY HISTORY:  Her mother died in her 42s with a stroke and may have had heart problems.  There is no information on her father.  She has no siblings with coronary artery disease.  REVIEW OF SYSTEMS:  Prior to admission, she had some chronic dyspnea on exertion and her activity was limited by her prosthesis.  She was not having any chest pain with exertion.  When she saw Dr.  Gala Romney in February 2012, she was going to rehab and tried to increase her ability to walk with a walker.  She was having no chest pain and no major problems with orthostasis.  In the hospital as her mental status has deteriorated, she is no longer getting p.o.'s and a repeat swallowing study is pending.  She has not had any recent melena or hematemesis. She did spike a temp last p.m. which is the reason for the antibiotics. Her T-max was 101.1 overnight.  PHYSICAL EXAMINATION:  VITAL SIGNS:  Current blood pressure 154/71, heart rate 52, sinus bradycardia, respiratory rate 18, O2 saturation 97% on 2 L.  GENERAL:  She is a well-developed elderly white female with decreased level of consciousness. HEENT:  Pupils midrange and minimally reactive.  Her head is normocephalic and atraumatic.  Extraocular movements were limited. There are no obvious cranial nerve deficits. NECK:  There is no lymphadenopathy, thyromegaly or significant JVD noted.  No true carotid bruits are noted. CV:  Her heart is regular, but slow in rate and rhythm with a systolic murmur noted.  Distal pulses are decreased in her right lower extremity and she is status post left BKA.  Radial pulses are 2+ bilaterally. CHEST:  She has a few rales in the bases. SKIN:  A wound on her right heel is bandaged and dressed and not draining, so it was not disturbed.  No other rashes or lesions are noted. ABDOMEN:  Soft and nontender with active bowel sounds. EXTREMITIES:  No cyanosis, clubbing or edema was noted. MUSCULOSKELETAL:  She is status post left BKA.  No other joint deformities or effusions are noted. NEURO:  She has some response to loud verbal and physical stimulus. Movements are minimally directed and it is unclear if she is comprehending questions, although she does respond to her name.  Portable chest x-ray done today shows increased bibasilar opacities and small effusions, left greater than right.  CT of the  head without contrast on November 07, 2010, shows large right PCA territory infarct with hemorrhagic conversion as previously seen. Bilateral cerebellar infarcts, left greater than right with possible interval hemorrhagic conversion on the left.  TEE shows EF 55-60% with no thrombus, no PFO and no shunt by bubble study.  LABORATORY VALUES:  Hemoglobin 12.0, hematocrit 36.6, WBC 7.8, platelets 131.  Sodium 137, potassium 3.8, chloride 102, CO2 27, BUN 26, creatinine 1.02, glucose 127.  Urinalysis this a.m. showed 40 ketones, large amount of blood, 100 protein, nitrites positive, large leukocytes and many bacteria with 11-20 wbc's.  Urine culture and blood cultures are pending.  EKG is currently sinus bradycardia rate 53 with no acute ischemic changes noted.  On telemetry review, she had atrial fibrillation with heart rates in  the 140s and post conversion sinus bradycardia in the 40s.  She also has occasional PVCs and some narrow complex tachycardia that is possibly atrial tach.  IMPRESSION:  Ms. Crymes was seen today by Dr. Eden Emms, the patient evaluated and the data reviewed.  She has had paroxysmal atrial fibrillation in the setting of fever and a cerebrovascular accident.  When she converts, she becomes bradycardic.  AV nodal blocking agents should be avoided.  We will try amiodarone 200 mg IV q.8 h. to keep her in sinus rhythm.  Code status was discussed with Mr. Diekmann and Dr. Jomarie Longs.  The patient has just been made a do not resuscitate.  We will continue the amiodarone with no further therapies or interventions planned.     Theodore Demark, PA-C   ______________________________ Noralyn Pick. Eden Emms, MD, [facility]    RB/MEDQ  D:  11/08/2010  T:  11/09/2010  Job:  [id number]  Electronically Signed by Theodore Demark PA-C on 11/09/2010 05:01:44 PM Electronically Signed by Charlton Haws MD [facility] on 11/17/2010 11:10:51 PM

## 2010-11-18 NOTE — Progress Notes (Signed)
Elizabeth [name], Elizabeth [name]            ACCOUNT NO.:  [id number]  MEDICAL RECORD NO.:  [id number]           PATIENT TYPE:  LOCATION:                                 FACILITY:  PHYSICIAN:  Zannie Cove, MD     DATE OF BIRTH:  1941-06-03                                PROGRESS NOTE   PRIMARY NEPHROLOGIST: Terrial Rhodes, MD  CARDIOLOGIST: Bevelyn Buckles. Bensimhon, MD  CURRENT DIAGNOSES: 1. 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 2. Proteus urinary tract infection. 3. Gram negative bacteremia, final speciation pending. 4. Status post renal transplant. 5. Type 2 diabetes. 6. Paroxysmal atrial fibrillation with tachybrady syndrome. 7. Coronary artery disease, status post coronary artery bypass     grafting. 8. Peripheral vascular disease, status post left below-knee     amputation. 9. Hypothyroidism. 10.History of pancreatic cyst, followed by Dr. Leone Payor. 11.Diabetic retinopathy. 12.Charcot foot deformity on the right. 13.Dyslipidemia. 14.Osteoporosis. 15.Orthostatic hypotension due to autonomic dysfunction.  CONSULTANTS: 1. Pramod P. Pearlean Brownie, MD, Neurology. 2. Bevelyn Buckles. Bensimhon, MD, [facility].  DIAGNOSTICS/INVESTIGATIONS: 1. More significant include MRI of the brain on November 02, 2010, showed     acute right PCA infarct involving the right hippocampus, evidence     of right PCA thrombus, patchy left cerebellar hemisphere acute     infarct. 2. CT angio of the head and neck showed occlusion of the right PCA     approximately 1 cm and beyond the origin and circumferential     intimal calcification of vertebral artery.  The foramen magnum with     narrowing estimated 30-50% in that region. 3. Repeat MRI of the brain on November 06, 2010, shows progression of     right PCA infarct, now include the entire medial temporal and     occipital lobe and hemorrhagic conversion with right PCA  territory     infarct and new nonhemorrhagic infarct involving the right     cerebellum and vermis. 4. Transesophageal echocardiogram on November 04, 2010, shows moderate     fixed plaque of the thoracic aorta, LV systolic function normal, EF     of 55-60%, and no evidence of left atrial thrombus.  HOSPITAL COURSE: 1. Elizabeth [name] is a 70 year old female with multiple medical     problems most significant for CAD, CABG, renal transplant, and BKA     who presented to the hospital with slurred speech.  On further     evaluation, she was found to have an acute right PCA infarct and     right PCA thrombus as well as left cerebellar infarct.  She was     seen by Neurology in consultation, originally kept on Plavix,     however, through the next few days she had increasing headaches and     persistent nausea and vomiting as well as depressed visual acuity     and profound visual defects which prompted to repeat MRI scan     showed progression of her strokes as well as new strokes and     hemorrhagic conversion.  Her outlook is  felt to be rather poor until     yesterday, but has stabilized today.  Her TEE has been     unremarkable, but we suspect an embolic stroke, but based on the     fact that she has been in and out of atrial fibrillation, unable to     be anticoagulated due to the hemorrhagic stroke at this point.     Neurology has been following the patient closely. 2. Atrial fibrillation with tachybrady syndrome, intermittently did     receive bolus doses and loading doses of diltiazem which had to be     stopped because of bradycardia, subsequently seen by Dr. Eden Emms and     Dr. Gala Romney from [facility] in consultation who started     the patient on IV amiodarone which is being today is the second day     of IV loading, not an anticoagulation candidate due to hemorrhagic     conversion of CVA. 3. Proteus UTI with gram-negative bacteremia.  We will need to wait     speciation of  bacteremia to find if it is Proteus from her urine or     not.  At this point, the patient is on day #3 of vancomycin.  I     will stop this and continue Zosyn and deescalate depending on final     culture results if indeed it was just secondary to urinary     infection. 4. Status post renal transplant.  Mild increase in creatinine over the     last 24 hours.  We will continue gentle IV fluids today and     continue prednisone, Myfortic, and Prograf.  If creatinine starts     to climb, we will need Dr. Hadley Pen input as well. 5. Diabetes.  Restart low-dose Lantus today and sliding-scale insulin. 6. PT/OT. 7. Code status was discussed with the patient's husband.  She would     like to limited code.     Zannie Cove, MD     PJ/MEDQ  D:  11/09/2010  T:  11/09/2010  Job:  [id number]  Electronically Signed by Zannie Cove  on 11/18/2010 05:54:18 PM

## 2010-11-18 NOTE — Consult Note (Signed)
[name], [name]            ACCOUNT NO.:  [id number]  MEDICAL RECORD NO.:  [id number]           PATIENT TYPE:  I  LOCATION:  3023                         FACILITY:  [facility]  PHYSICIAN:  Rose Phi. Myna Hidalgo, M.D. DATE OF BIRTH:  Oct 02, 1940  DATE OF CONSULTATION:  11/12/2010 DATE OF DISCHARGE:                                CONSULTATION   REFERRING PHYSICIAN:  Dr. Irene Limbo.  REASON FOR CONSULTATION: 1. Upper extremity DVT of the radial vein of the right arm. 2. CVA - infarct followed by hemorrhagic conversion. 3. Longstanding diabetes with multiple diabetic complications. 4. History of renal transplantation. 5. Left BKA. 6. Remote history of melanoma.  HISTORY OF PRESENT ILLNESS:  Elizabeth [name] is a nice 70 year old white female with incredibly complicated history.  She has a longstanding diabetes.  She has had diabetes for about 40 years.  She has had multiple complications from this.  She did undergo a kidney transplant in [place] several years ago.  She seemed to do fairly well from this.  She has had amputation of the left leg.  She has had diabetic retinopathy and neuropathy.  She has hypertension and hyperlipidemia. She has hypothyroidism.  In the past, she unfortunately was admitted on the 13th with a stroke. She presented with headache, nausea, vomiting, and some left-sided weakness.  An MRI of the head was done which showed an acute large right posterior cerebral artery infarct.  There is also small cerebellar white matter infarct.  She has been also I think found to have atrial fibrillation.  When she was admitted, she was I think on aspirin.  She subsequently has had other issues.  She has had bacteremia from Proteus mirabilis.  This probably is coming from her urine where urine is growing Proteus and Citrobacter.  She has a PICC line in the right arm.  Her right arm was found to be swollen today.  She then had a Doppler done.  The Doppler showed a thrombus  in the right radial vein.  There also was a superficial thrombus in the cephalic vein.  She had been on some low-dose Lovenox.  She had been on aspirin.  She was considering to be started on heparin.  Unfortunately, her husband was told that she should never on heparin because of a retinal bleed 20 years ago.  We are asked to see her to see what we felt was appropriate for her.  She really cannot give much of history right now.  Her eyes are enclosed.  She does seem to respond a little bit to questions, although her responses are somewhat simple.  Her past normal medical history is documented as before.  Her allergies are none.  Her medications are: 1. Amiodarone 200 mg daily. 2. Rocephin 1 g IV q.24 h. 3. Lantus insulin 10 units subcu nightly. 4. Synthroid 0.125 mg daily. 5. Mycophenolate 360 mg p.o. b.i.d. 6. Prednisone 5 mg p.o. daily. 7. Prograf 2 mg p.o. q.a.m. and 1.5 mg p.o. nightly. 8. Topamax 15 mg p.o. daily. 9. Valproic acid 500 mg p.o. b.i.d. 10.Coumadin 2 mg daily alternating with 1 mg daily. 11.Hydralazine 10 mg IV q.6  h. 12.Metoprolol 2.5 IV q.8 h. as needed. 13.Ultram 100 mg p.o. q.8 h. as needed. 14.Phenergan 6.25 mg IV q.6 h. p.r.n.  SOCIAL HISTORY:  Negative for tobacco use.  There is no alcohol use.  FAMILY HISTORY:  Remarkable for CVA with her mother.  REVIEW OF SYSTEMS:  Could not be obtained from the patient.  PHYSICAL EXAMINATION:  GENERAL:  This is a chronically ill-appearing white female in no obvious distress. VITAL SIGNS:  Temperature of 97.9, pulse 57, respiratory rate 18, and blood pressure 157/56. HEAD:  Normocephalic and atraumatic skull.  There is no obvious oral lesions.  She really will not open her eyes. NECK:  Supple with no adenopathy. LUNGS:  With some decreased breath sounds at the bases. CARDIAC:  Regular rate and rhythm with normal S1 and S2.  She had a 1/6 systolic ejection murmur. ABDOMEN:  Soft with decreased bowel sounds.   There is no fluid wave. There is no guarding, rebound, or tenderness.  There is no palpable hepatosplenomegaly. EXTREMITIES:  Left BKA. NEUROLOGICAL:  Really was difficult to attempt.  LABORATORY STUDIES:  White cell count is 6.6, hemoglobin 11.3, hematocrit 34.6, and platelet count 128.  Sodium 137, potassium 3.4, BUN 27, creatinine 1.04, and glucose 226.  Beta-nitrate peptide is 746.  Her protime is 1.89.  IMPRESSION:  Elizabeth [name] is a 70 year old white female with multiple medical issues.  She has mostly had complications related to longstanding diabetes.  She now has a DVT in the right upper extremity.  This appears to be catheter related.  It sounds like she needs the catheter for her bacteremia.  Again, she is growing Proteus in the blood.  This is pretty much at [facility]  She does have Proteus and Citrobacter in the urine.  She has had a thrombotic CVA that apparently has converted over to a hemorrhagic lesion.  By her last MRI back on November 06, 2010, there was progression of the right PCA infarct that includes the entire medial temporal and occipital lobe.  There is hemorrhagic conversion within the right PCA territory infarct.  There is progression of a nonhemorrhagic left cerebellar infarct.  There is a new nonhemorrhagic infarct involving the right cerebellum and vermis.  I think that Ms. Kinnick clearly is showing [place] that she is vasculopathic.  I think she is hypercoagulable.  This could be from her diabetes.  It could be from an atrial fibrillation.  It could be from underlying infection.  I realize that there is always risks and benefits to whatever is offered.  However, I think that for Ms. Daniello, the risk of further clotting is significant and that the benefit from closely monitor anticoagulation is also highly significant and I think definitely is warranted.  I think that with monitoring heparin levels, given her heparin, without a bolus, would  be safe.  I would definitely check a homocysteine level on her.  It is not unusual to see elevated homocysteine level in patients who have had catheter- related thrombi.  If she does have elevated homocysteine level, I would add folic acid.  Typically, for upper extremity thrombi related to catheters, anticoagulation with Coumadin is recommended for 3 months.  This certainly will be a good "starting point" for Ms. Lamprecht.  I would be quite conservative with her INR and try to keep her INR between 2-2.5.  Again, Pharmacy can be quite helpful with this.  I spoke to the husband regarding the use of heparin.  I told him that from  my point of year I think heparin would be safe.  I told him that any anticoagulation intervention could lead to retinal bleeding in patients who are diabetic.  Again, I think that the risk of her developing further thrombotic or embolic events is great and that she really will not be able to tolerate.  Progression of this upper extremity thrombus and run the risk of a pulmonary embolus which I think could be lethal for her.  Granting it, I realize that the risk of upper extremity DVTs to progress into pulmonary emboli is less than 10%, but I think with Ms. Tipps, it would be higher than this.  We will just plan to follow her along conservatively.  We will help out in any way that we can.     Rose Phi. Myna Hidalgo, M.D.     PRE/MEDQ  D:  11/12/2010  T:  11/13/2010  Job:  [id number]  cc:   Bevelyn Buckles. Bensimhon, MD Terrial Rhodes, M.D. Alford Highland. Rankin, M.D. Pramod P. Pearlean Brownie, MD  Electronically Signed by Arlan Organ  on 11/18/2010 07:47:57 AM

## 2010-11-21 ENCOUNTER — Encounter: Payer: Medicare Other | Admitting: Internal Medicine

## 2010-11-21 NOTE — Discharge Summary (Signed)
Elizabeth [name], Elizabeth [name]            ACCOUNT NO.:  [id number]  MEDICAL RECORD NO.:  [id number]           PATIENT TYPE:  I  LOCATION:  3023                         FACILITY:  [facility]  PHYSICIAN:  Baltazar Najjar, MD     DATE OF BIRTH:  [birth year]  DATE OF ADMISSION:  11/02/2010 DATE OF DISCHARGE:                              DISCHARGE SUMMARY   Please refer to the dictated progress note on November 09, 2010 for more details.  FINAL DISCHARGE DIAGNOSES: 1. 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 2. Proteus mirabilis urosepsis. 3. Hypoglycemia resolved. 4. Atrial fibrillation, currently on Coumadin. 5. New right upper extremity deep venous thrombosis on Coumadin. 6. History of kidney transplant on immunosuppressive with stable renal     function. 7. Type 2 diabetes mellitus.  SECONDARY DISCHARGE DIAGNOSES: 1. History of coronary artery disease status post coronary artery     bypass grafting. 2. Peripheral vascular disease status post left below-knee amputation. 3. Hypothyroidism. 4. History of pancreatic cyst followed by Dr. Leone Payor. 5. History of diabetic retinopathy. 6. Charcot foot deformity on the right. 7. History of dyslipidemia. 8. Osteoporosis. 9. Orthostatic hypotension secondary to autonomic dysfunction.  CONSULTANTS: 1. Pramod P. Pearlean Brownie, MD with Neurology. 2. Bevelyn Buckles. Bensimhon, MD with [facility] 3. Rose Phi. Myna Hidalgo, MD from Hematology/Oncology.  RADIOLOGY/IMAGING: 1. Please refer to the dictated progress note for previous imaging     studies addendum.  CT head done November 10, 2010 showed stable right     PCA infarct with hemorrhagic transformation.  Stable mass effect on     the right lateral ventricle without significant ventriculomegaly.     No impending herniation.  Stable left cerebellar infarct without     hemorrhage.  No new intracranial abnormalities. 2. Portable  chest x-ray on March 21 showed right PICC line tip     cavoatrial junction, otherwise stable.  BRIEF ADMITTING HISTORY:  Please refer to H and P and progress note dated March 2012 for admitting history.  HOSPITAL COURSE:  Please refer to the dictated progress note from March 2012 for more details. 1. CVA.  Left cerebellar infarct/right PCA infarct, currently stable. 2. Atrial fibrillation, rate is currently stable.  The patient is on     amiodarone, to continue.  She is also on Coumadin.  Her INR is     elevated today at 2.96.  Her goal is 2-2.5.  We recommend to hold     Coumadin today.  Recheck INR in the morning and adjust her Coumadin     dose. 3. New right upper extremity DVT felt to be secondary to catheter,     seen by Dr. Myna Hidalgo from Hematology who recommended to continue     with Coumadin.  INR goal is 2-2.5. 4. Proteus mirabilis urosepsis.  The patient was treated with     ceftriaxone.  Today is day #6, I will discharge her on     ciprofloxacin 500 mg b.i.d. to complete 10 days of antibiotics. 5. History of kidney transplant.  Her renal function remained stable.  The patient to continue her immunosuppressive medication and to     follow with her nephrologist as scheduled on discharge. 6. Diabetes mellitus type 2.  The patient was noted to have     hypoglycemic episodes yesterday.  Her Lantus was discontinued and     she was covered with sliding scale.  Her blood glucose levels seems     to be stabilized.  I will decrease her Lantus dose to 5 units at     bedtime rather than 10 units and she will need close monitoring of     her CBG to adjust her insulin dose as needed. 7. Hypothyroidism.  Her TSH was slightly lower than targer and her     Synthroid dose was adjusted.  DISCHARGE MEDICATIONS: 1. Amiodarone 200 mg p.o. daily. 2. Topamax 50 mg p.o. daily. 3. Tramadol 50 mg tablets, take 100 mg p.o. q.8 h. p.r.n. 4. Valproic acid 250 mg capsule, take 500 mg p.o. twice  daily. 5. Ciprofloxacin 500 mg p.o. b.i.d. 6. Lantus insulin 5 units subcutaneously daily at bedtime. 7. Synthroid 125 mcg 1 tablet p.o. q.a.m. 8. Caltrate 600 mg/400 units 1 tablet p.o. twice daily. 9. Humalog insulin 3-10 units subcutaneously as per sliding scale 3     times a day with meals. 10.Lipitor 10 mg 1 tablet on Sundays, Monday, Wednesdays, and Fridays,     every other day. 11.Multivitamins 1/2 tablet twice daily. 12.Myfortic 360 mg p.o. twice daily. 13.Prednisone 5 mg p.o. daily. 14.Prograf 0.5 mg 3-4 tablets by mouth twice daily as instructed, 2 mg     in the morning and 1.5 mg in the evening.  DISCHARGE INSTRUCTIONS: 1. The patient to continue above medications as prescribed. 2. The patient to report any worsening of symptoms or any new symptoms     to her PCP. 3. Recommendations to hold Coumadin tonight.  Recheck INR in the     morning and adjust Coumadin dose for a target of INR of 2-2.5 as     per PCP or rounding physician at the skilled nursing facility.  I     will order for INR     check in the morning. 4. Please monitor her CBG very closely and adjust her insulin dose     p.r.n.  CONDITION ON DISCHARGE:  Stable.          ______________________________ Baltazar Najjar, MD     SA/MEDQ  D:  11/16/2010  T:  11/16/2010  Job:  [id number]  cc:   Terrial Rhodes, M.D. Bevelyn Buckles. Bensimhon, MD  Electronically Signed by Hannah Beat MD on 11/21/2010 08:51:21 PM

## 2010-11-29 LAB — GLUCOSE, CAPILLARY: Glucose-Capillary: 91 mg/dL (ref 70–99)

## 2010-12-06 ENCOUNTER — Telehealth: Payer: Self-pay | Admitting: Internal Medicine

## 2010-12-06 NOTE — Telephone Encounter (Signed)
Left message to call back  

## 2010-12-06 NOTE — Telephone Encounter (Addendum)
Pt husband has question re pt meds.

## 2010-12-08 NOTE — Telephone Encounter (Signed)
Left message to call back  

## 2010-12-09 NOTE — Telephone Encounter (Signed)
Spoke with pt's husband who reports pt has had a series of strokes and is now living at [facility] in [place]. Husband would like to meet with Dr. Gala Romney to discuss pt's medicines and plan of care.  Husband states wife does not need appointment as she is unable at this time to tolerate trip to doctor's office.  I told husband I would forward message to Dr. Gala Romney for his input to see if this can be discussed via phone or if a time needs to be arranged for him to meet with Dr. Gala Romney. Husband agreeable with this plan.

## 2010-12-14 ENCOUNTER — Observation Stay (HOSPITAL_COMMUNITY)
Admission: EM | Admit: 2010-12-14 | Discharge: 2010-12-15 | Disposition: A | Discharge status: Skilled Nursing Facility | Payer: Medicare Other | Attending: Cardiology | Admitting: Cardiology

## 2010-12-14 ENCOUNTER — Emergency Department (HOSPITAL_COMMUNITY): Payer: Medicare Other

## 2010-12-14 DIAGNOSIS — Z940 Kidney transplant status: Secondary | ICD-10-CM | POA: Insufficient documentation

## 2010-12-14 DIAGNOSIS — R0609 Other forms of dyspnea: Secondary | ICD-10-CM | POA: Insufficient documentation

## 2010-12-14 DIAGNOSIS — R0789 Other chest pain: Principal | ICD-10-CM | POA: Insufficient documentation

## 2010-12-14 DIAGNOSIS — E1139 Type 2 diabetes mellitus with other diabetic ophthalmic complication: Secondary | ICD-10-CM | POA: Insufficient documentation

## 2010-12-14 DIAGNOSIS — E1129 Type 2 diabetes mellitus with other diabetic kidney complication: Secondary | ICD-10-CM | POA: Insufficient documentation

## 2010-12-14 DIAGNOSIS — E1142 Type 2 diabetes mellitus with diabetic polyneuropathy: Secondary | ICD-10-CM | POA: Insufficient documentation

## 2010-12-14 DIAGNOSIS — K862 Cyst of pancreas: Secondary | ICD-10-CM | POA: Insufficient documentation

## 2010-12-14 DIAGNOSIS — I69919 Unspecified symptoms and signs involving cognitive functions following unspecified cerebrovascular disease: Secondary | ICD-10-CM | POA: Insufficient documentation

## 2010-12-14 DIAGNOSIS — Z85820 Personal history of malignant melanoma of skin: Secondary | ICD-10-CM | POA: Insufficient documentation

## 2010-12-14 DIAGNOSIS — M810 Age-related osteoporosis without current pathological fracture: Secondary | ICD-10-CM | POA: Insufficient documentation

## 2010-12-14 DIAGNOSIS — Z79899 Other long term (current) drug therapy: Secondary | ICD-10-CM | POA: Insufficient documentation

## 2010-12-14 DIAGNOSIS — I2510 Atherosclerotic heart disease of native coronary artery without angina pectoris: Secondary | ICD-10-CM | POA: Insufficient documentation

## 2010-12-14 DIAGNOSIS — I739 Peripheral vascular disease, unspecified: Secondary | ICD-10-CM | POA: Insufficient documentation

## 2010-12-14 DIAGNOSIS — E11319 Type 2 diabetes mellitus with unspecified diabetic retinopathy without macular edema: Secondary | ICD-10-CM | POA: Insufficient documentation

## 2010-12-14 DIAGNOSIS — E039 Hypothyroidism, unspecified: Secondary | ICD-10-CM | POA: Insufficient documentation

## 2010-12-14 DIAGNOSIS — E1149 Type 2 diabetes mellitus with other diabetic neurological complication: Secondary | ICD-10-CM | POA: Insufficient documentation

## 2010-12-14 DIAGNOSIS — S88119A Complete traumatic amputation at level between knee and ankle, unspecified lower leg, initial encounter: Secondary | ICD-10-CM | POA: Insufficient documentation

## 2010-12-14 DIAGNOSIS — N058 Unspecified nephritic syndrome with other morphologic changes: Secondary | ICD-10-CM | POA: Insufficient documentation

## 2010-12-14 DIAGNOSIS — D649 Anemia, unspecified: Secondary | ICD-10-CM | POA: Insufficient documentation

## 2010-12-14 DIAGNOSIS — Z7901 Long term (current) use of anticoagulants: Secondary | ICD-10-CM | POA: Insufficient documentation

## 2010-12-14 DIAGNOSIS — I10 Essential (primary) hypertension: Secondary | ICD-10-CM | POA: Insufficient documentation

## 2010-12-14 DIAGNOSIS — E785 Hyperlipidemia, unspecified: Secondary | ICD-10-CM | POA: Insufficient documentation

## 2010-12-14 DIAGNOSIS — D696 Thrombocytopenia, unspecified: Secondary | ICD-10-CM | POA: Insufficient documentation

## 2010-12-14 DIAGNOSIS — R0989 Other specified symptoms and signs involving the circulatory and respiratory systems: Secondary | ICD-10-CM | POA: Insufficient documentation

## 2010-12-14 DIAGNOSIS — Z951 Presence of aortocoronary bypass graft: Secondary | ICD-10-CM | POA: Insufficient documentation

## 2010-12-14 DIAGNOSIS — R079 Chest pain, unspecified: Secondary | ICD-10-CM

## 2010-12-14 LAB — CBC
HCT: 31.5 % — ABNORMAL LOW (ref 36.0–46.0)
Hemoglobin: 10.4 g/dL — ABNORMAL LOW (ref 12.0–15.0)
MCHC: 33 g/dL (ref 30.0–36.0)
WBC: 4.2 10*3/uL (ref 4.0–10.5)

## 2010-12-14 LAB — COMPREHENSIVE METABOLIC PANEL
Albumin: 3.1 g/dL — ABNORMAL LOW (ref 3.5–5.2)
BUN: 16 mg/dL (ref 6–23)
Creatinine, Ser: 1.05 mg/dL (ref 0.4–1.2)
Total Protein: 5.3 g/dL — ABNORMAL LOW (ref 6.0–8.3)

## 2010-12-14 LAB — URINALYSIS, ROUTINE W REFLEX MICROSCOPIC
Glucose, UA: NEGATIVE mg/dL
Hgb urine dipstick: NEGATIVE
Specific Gravity, Urine: 1.022 (ref 1.005–1.030)
Urobilinogen, UA: 0.2 mg/dL (ref 0.0–1.0)

## 2010-12-14 LAB — DIFFERENTIAL
Basophils Absolute: 0 10*3/uL (ref 0.0–0.1)
Lymphocytes Relative: 33 % (ref 12–46)
Lymphs Abs: 1.4 10*3/uL (ref 0.7–4.0)
Monocytes Absolute: 0.5 10*3/uL (ref 0.1–1.0)
Neutro Abs: 2.3 10*3/uL (ref 1.7–7.7)
Neutrophils Relative %: 55 % (ref 43–77)

## 2010-12-14 LAB — POCT CARDIAC MARKERS
CKMB, poc: <1 ng/mL — ABNORMAL LOW (ref 1.0–8.0)
Troponin i, poc: <0.05 ng/mL (ref 0.00–0.09)

## 2010-12-14 LAB — GLUCOSE, CAPILLARY
Glucose-Capillary: 218 mg/dL — ABNORMAL HIGH (ref 70–99)
Glucose-Capillary: 86 mg/dL (ref 70–99)

## 2010-12-14 LAB — URINE MICROSCOPIC-ADD ON

## 2010-12-14 LAB — MRSA PCR SCREENING: MRSA by PCR: NEGATIVE

## 2010-12-14 LAB — CK TOTAL AND CKMB (NOT AT ARMC): CK, MB: 0.9 ng/mL (ref 0.3–4.0)

## 2010-12-14 LAB — PROTIME-INR: INR: 2.16 — ABNORMAL HIGH (ref 0.00–1.49)

## 2010-12-15 LAB — GLUCOSE, CAPILLARY
Glucose-Capillary: 171 mg/dL — ABNORMAL HIGH (ref 70–99)
Glucose-Capillary: 141 mg/dL — ABNORMAL HIGH (ref 70–99)

## 2010-12-15 LAB — BASIC METABOLIC PANEL
BUN: 15 mg/dL (ref 6–23)
Calcium: 9.1 mg/dL (ref 8.4–10.5)
Creatinine, Ser: 1.04 mg/dL (ref 0.4–1.2)
GFR calc non Af Amer: 52 mL/min — ABNORMAL LOW (ref >60)
Glucose, Bld: 61 mg/dL — ABNORMAL LOW (ref 70–99)
Sodium: 141 mEq/L (ref 135–145)

## 2010-12-15 LAB — CARDIAC PANEL(CRET KIN+CKTOT+MB+TROPI)
Relative Index: INVALID (ref 0.0–2.5)
Total CK: 22 U/L (ref 7–177)
Troponin I: 0.05 ng/mL (ref 0.00–0.06)

## 2010-12-15 LAB — APTT: aPTT: 31 seconds (ref 24–37)

## 2010-12-15 LAB — CBC
HCT: 28.8 % — ABNORMAL LOW (ref 36.0–46.0)
MCHC: 33 g/dL (ref 30.0–36.0)
RDW: 13.2 % (ref 11.5–15.5)

## 2010-12-15 NOTE — Discharge Summary (Addendum)
Elizabeth [name], Elizabeth [name]            ACCOUNT NO.:  [id number]  MEDICAL RECORD NO.:  [id number]           PATIENT TYPE:  I  LOCATION:  4708                         FACILITY:  [facility]  PHYSICIAN:  Cassell Clement, M.D. DATE OF BIRTH:  02-08-1941  DATE OF ADMISSION:  12/14/2010 DATE OF DISCHARGE:                              DISCHARGE SUMMARY   DISCHARGE DIAGNOSES: 1. Musculoskeletal chest wall pain, probably secondary to hairline rib     fracture.     a.     Cardiac enzymes negative x3. 2. Thrombocytopenia, discharge platelet count 50,000, for outpatient     CBC. 3. Coronary artery disease status post coronary artery bypass graft in     2001 with SVG to the circumflex. 4. 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     a.     Discharge creatinine of 1.04. 5. Diabetes mellitus with nephropathy, retinopathy, and neuropathy. 6. PVD which Charcot joint cyst with left BKA.7. History of orthostatic hypotension. 8. Hyperlipidemia. 9. Hypertension. 10.Anemia. 11.Hypothyroidism. 12.Pancreatic cyst. 13.Osteoporosis. 14.History of melena. 15.Preserved LV function with EF of 55% by echo November 04, 2010. 16.Recent cerebrovascular accident March 2012. 17.Right upper extremity deep venous thrombosis diagnosed March 2012,     maintained on Coumadin.  HOSPITAL COURSE:  Elizabeth [name] is a 70 year old female with multiple medical problems including CAD, PVD and renal disease as well as diabetes who presented to [facility] with complaints of left- sided chest pain which she describes as a stabbing sensation with occasional shortness of breath.  The patient's resident stated that they are helping her move from bed to a chair and at that time he heard a popping sound, since that time the patient has been complaining of chest pain.  Point-of-care markers were negative x1.  EKG demonstrated new T- wave inversions laterally.  The patient's initial  platelet count was 39,000, down from 173.  She was brought in the hospital for rule out. Chest x-ray did not reveal any fractured ribs, but it could not exclude hairline fracture per report.  She did have chest wall tenderness.  Her chest pain was felt noncardiac.  On day of admission her platelet count had come up to 50,000.  Dr. Patty Sermons feels it is still safe to continue her Coumadin, with an outpatient CBC in 1 week to follow her platelets. He has seen and examined her today and feels she is stable for discharge.  The only change was made this hospitalization was the addition of Vicodin for pain control.  DISCHARGE LABS:  WBC 4.2, hemoglobin 9.5, hematocrit 28.8, platelet count 50, INR 2.46.  Sodium 141, chloride 102, CO2 35, glucose 61, and actually glucose has been come up to 114, BUN 15, creatinine 1.04.  Cardiac enzymes negative x3.  DISCHARGE MEDICATIONS: 1. Hydrocodone/APAP 5/325 mg 1-2 tablets q.6 h. p.r.n. moderate-to-     severe pain. 2. Amiodarone 200 mg every other day. 3. Ativan 1 mg q.4.h. p.r.n. agitation, anxiety. 4. Calcium carbonate/vitamin D 600/400 b.i.d. 5. Coumadin 4 mg daily. 6. Fludrocortisone  0.1 mg every morning. 7. Humalog up to 12 units subcutaneously  q.i.d. per nursing home     sliding scale. 8. Lantus 12 units every morning. 9. Lipitor 10 mg every other day. 10.Myfortic 360 mg b.i.d. 11.Prednisone 5 mg daily. 12.Prograf 0.5 mg 4 capsules every morning, Prograf 1 mg every     evening. 13.Seroquel 25 mg p.o. at bedtime. 14.Synthroid 125 mcg daily every morning. 15.Tab-A-Vite half tablet b.i.d. 16.Tramadol 50 mg 2 tablets q.8 hours p.r.n. pain. 17.Tylenol 650 mg 1-2 tablets q. 4 hours p.r.n.  DISPOSITION:  Elizabeth [name] will be discharged back in stable condition to the nursing home.  She is on low-sodium heart-healthy diabetic diet. Increase activity slowly.  Dr. Patty Sermons wants to note that may use her right upper arm for blood pressure  measurements.  She will follow up with Dr. Gala Romney Jan 05, 2011, at 2:15 p.m..  We have also given her a lab slip to have the nursing facility draw PT/INR and CBC on December 20, 2010.  DURATION OF DISCHARGE ENCOUNTER:  Greater than 30 minutes including physician and PA time.     Ronie Spies, P.A.C.   ______________________________ Cassell Clement, M.D.    DD/MEDQ  D:  12/15/2010  T:  12/15/2010  Job:  [id number]  cc:   Bevelyn Buckles. Bensimhon, MD Terrial Rhodes, M.D.  Electronically Signed by Ronie Spies  on 12/15/2010 02:48:51 PM Electronically Signed by Cassell Clement M.D. on 12/21/2010 01:33:40 PM

## 2010-12-15 NOTE — Telephone Encounter (Signed)
Pt states he has talked with Dr Patty Sermons and his questions have been answered, pt has appt. With Dr Gala Romney in May

## 2010-12-21 ENCOUNTER — Encounter: Payer: Medicare Other | Admitting: Internal Medicine

## 2010-12-23 ENCOUNTER — Telehealth: Payer: Self-pay

## 2010-12-23 NOTE — Telephone Encounter (Signed)
Message copied by Darcey Nora on Thu Dec 23, 2010  8:40 AM ------      Message from: Stan Head      Created: Wed Dec 22, 2010  2:41 PM       She had a stroke in March and was also just hospitalized with rib fracture. Please call husband and tell him I think she should see me in June or so and we can discuss whether to repeat this then. Do not think we would do anything different with MR info at this time so will hold off on that            ----- Message -----         From: Rossie Muskrat, RN,CGRN         Sent: 12/21/2010  11:16 AM           To: Iva Boop, MD            Per your last office note from November 2011 she needs follow up MRI to follow up pancreatic cyst.  You have ? 6 months which is now.  Please review and advise if we need to order now.      ----- Message -----         From: Rossie Muskrat, RN,CGRN         Sent: 12/21/2010           To: Rossie Muskrat, RN            Needs MRI to follow up pancreatic lesion

## 2010-12-23 NOTE — Telephone Encounter (Signed)
I spoke with the patient's husband she is still in a skilled nursing facility. He will call back to schedule an office visit once she is back home and able to travel.  He is advised that no MRI for now that that will be reviewed at an office visit.  Patient's husband agreed with the plan.

## 2010-12-28 ENCOUNTER — Ambulatory Visit: Payer: Medicare Other

## 2011-01-03 ENCOUNTER — Telehealth: Payer: Self-pay | Admitting: Internal Medicine

## 2011-01-03 NOTE — H&P (Signed)
Elizabeth [name], Elizabeth [name]            ACCOUNT NO.:  [id number]  MEDICAL RECORD NO.:  [id number]           PATIENT TYPE:  I  LOCATION:  4708                         FACILITY:  [facility]  PHYSICIAN:  Cassell Clement, M.D. DATE OF BIRTH:  09-12-1940  DATE OF ADMISSION:  12/14/2010 DATE OF DISCHARGE:                             HISTORY & PHYSICAL   PRIMARY CARDIOLOGIST:  Bevelyn Buckles. Bensimhon, MD  PRIMARY CARE PROVIDER:  Terrial Rhodes, MD  CHIEF COMPLAINTS:  Chest pain.  PATIENT PROFILE:  This is a 70 year old female with history of coronary artery disease status post coronary artery bypass grafting and recent discharge for a CVA, initially an infarct followed by hemorrhagic conversion who presents with chest pain.  PAST MEDICAL HISTORY: 1. Coronary artery disease status post coronary artery bypass grafting     in 2001, SVG to circumflex. 2. 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 3. Diabetes mellitus with nephropathy, retinopathy, and neuropathy. 4. PVD with Charcot joint, status post left BKA. 5. History of orthostatic hypotension. 6. Hyperlipidemia. 7. Hypertension. 8. Anemia. 9. Hypothyroidism. 10.Pancreatic cyst. 11.Osteoporosis. 12.History of melanoma. 13.Preserved left ventricular function with an EF of 55-60% per     echocardiogram November 04, 2010.  HISTORY OF PRESENT ILLNESS:  This is a 70 year old Caucasian female with multiple medical problems as stated above who has been at [facility] since discharge on November 16, 2010.  Per the patient's husband, she has been doing well.  The patient is a poor historian secondary to her recent stroke and has some residual mental deficits.  The patient does present with left-sided chest pain.  The pain is described as a stabbing sensation with occasional shortness of breath.  The patient's husband states that he was helping move the patient from a bed to a  chair, (as she does have a BKA on the left side).  At this time, he heard a popping sound and since that time the patient has been complaining of chest pain.  The husband is unsure if this is related.  The patient does state that her chest pain has been ongoing for a while.  The patient states it is constant.  She denies any nausea, vomiting, fever, or chills.  When an employee at the skilled nursing facility heard of the chest pain, the patient was sent to [facility] for further evaluation.  Her EKG demonstrates new T-wave inversions laterally.  The patient is still complaining of mild chest tenderness with palpation.  Her point-of-care markers are negative x1.  Of note, the patient's initial platelet count is 39, this is a decrease from 173 at the end of March.  I have called the lab and this has been rerun and is a true number.  SURGICAL HISTORY:  Status post renal transplant and coronary bypass grafting, BKA on the left side, appendectomy, cataract, cholecystectomy, hysterectomy, thyroid history, tonsillectomy, C-section x2, and vitrectomy.  ALLERGIES:  No known drug allergies.  HOME MEDICATIONS: 1. Ativan 1 mg one tablet every 4 hours as needed for agitation and     anxiety. 2. Tylenol 650 mg 1-2 tablets  every 4 hours as needed for pain. 3. Tramadol 50 mg 2 tablets every 8 hours as needed for pain. 4. Humalog insulin 0-12 units four times daily per sliding scale. 5. Amiodarone 200 mg 1 tablet every other day. 6. Myfortic 360 mg 1 tablet twice daily. 7. Lipitor 10 mg 1 tablet every other day. 8. Calcium carbonate/vitamin D 600/400 1 tablet twice daily. 9. Prograf 0.5 mg 4 capsules every morning. 10.Tab-A-Vite over-the-counter half tablet twice daily. 11.Synthroid 125 mcg 1 tablet every morning. 12.Prednisone 5 mg daily. 13.Prograf  1 mg every evening. 14.Lantus 12 units every morning. 15.Seroquel 25 mg 1 tablet daily at bedtime. 16.Coumadin 4 mg 1 tablet daily. 17.Fludrocortisone  0.1 mg 1 tablet daily.  REVIEW OF SYSTEMS:  All pertinent positives and negatives as stated in the HPI.  Other systems have been reviewed and are negative.  CODE STATUS:  DNR.  PHYSICAL EXAMINATION:  VITAL SIGNS:  Temperature 97.7, pulse 57, respirations 16, blood pressure 143/62, O2 saturation 100% on 2 L. GENERAL:  This is a frail-appearing, polite elderly female in no acute distress. HEENT:  Normal. NECK:  Supple without bruit or JVD. HEART:  Bradycardic.  S1, S2.  No murmur, rub, or gallop noted.  PMI is normal.  Pulses 2+ and equal bilaterally. LUNGS:  Clear to auscultation anteriorly. ABDOMEN:  Soft, nontender, positive bowel sounds x4. EXTREMITIES:  No clubbing, cyanosis, or edema.  Left-sided BKA. MUSCULOSKELETAL:  Residual left wrist fracture that was not set.  No joint deformities. NEURO:  Alert and oriented x1.  Cranial nerves II through XII grossly intact.  Chest x-ray; left lower lobe atelectasis, though that has improved since November 10, 2010.  EKG showing sinus rhythm at a rate 68 beats per minute with new T-wave inversions laterally.  LABORATORY DATA:  WBC 4.2, hemoglobin 10.4, hematocrit 31.5, platelets 39, sodium 139, potassium 3.8, chloride 100, bicarb 34, BUN 16, creatinine 1.05.  Point-of-care markers negative x1, AST 38, ALT 39, total bili 0.5, AP 46.  ASSESSMENT AND PLAN:  This is a 70 year old Caucasian female who presents with chest pain that appears atypical in nature.  It began approximately one week ago when her husband was helping lift her from bed.  There was sudden onset of left-sided sharp pain.  This persisted and she presented to the emergency department.  EKG today showed new T- wave inversions laterally.  Chest x-ray does not reveal any fractured ribs, but not exclusion of a hairline fracture.  At this time, we will plan to admit the patient to observation as she is a poor historian and we will cycle cardiac markers as well as EKGs.  If the  patient rules out for myocardial infarction and unless the cardiac markers are markedly elevated, she can be released to SNF in the morning.  With the patient's new thrombocytopenia, we will consider heme consult if platelet count remains low with redraw.  At this time, we will continue warfarin for recent stroke and right arm DVT.  Further treatment will be dependent upon the above.     Leonette Monarch, PA-C   ______________________________ Cassell Clement, M.D.    NB/MEDQ  D:  12/14/2010  T:  12/15/2010  Job:  [id number]  cc:   Bevelyn Buckles. Bensimhon, MD Terrial Rhodes  Electronically Signed by Alen Blew P.A. on 12/23/2010 56:21:30 PM Electronically Signed by Cassell Clement M.D. on 01/03/2011 04:43:00 AM

## 2011-01-03 NOTE — Telephone Encounter (Signed)
I spoke with the pt's husband and the pt has a history of orthostatic hypotension and a series of strokes 2 months ago. At this time the pt does not have any urgent issues per spouse that need immediate follow-up. The pt is in a skilled nursing facility and they cannot bring the pt to the [place] office on 01/05/11 to see Dr Gala Romney. The spouse would like to cancel this appointment and arrange an appointment in the [place] office.  I spoke with Marchelle Folks in the [place] office and arranged for the pt to be seen on 01/06/11 at 2:00 with Dr Mariah Milling.

## 2011-01-03 NOTE — Telephone Encounter (Signed)
Pt has low b/p and husband can't get pt to [place] needs appt in [place]

## 2011-01-04 NOTE — Assessment & Plan Note (Signed)
[facility]                            CARDIOLOGY OFFICE NOTE   NAME:[name] [name] CHRISTNER                   MRN:          [id number]  DATE:02/11/2008                            DOB:          05/11/1941    REFERRING PHYSICIAN:  Terrial [name], M.D.   REASON FOR CONSULT:  Establish long-term cardiac care.   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  She is status post kidney  transplant in December 2001.  She also has diabetes complicated by  retinopathy, neuropathy, and nephropathy, osteoporosis, peripheral  vascular disease status post left BKA in the setting of a Charcot joint.   She was diagnosed with coronary artery disease back in 2001.  Apparently, she had a very high-grade circumflex lesion which was not  amenable to stenting, so she underwent one-vessel bypass surgery at that  time.  Apparently, her other arteries looked quite good.  She had normal  LV function.  She also has apparently mild carotid artery stenoses.   About a month and half ago, she moved to the husband to [place]  to be close to her children and grandchildren.  She presents to  establish long-term care.  From a cardiac point of view, she is doing  well.  She denies any chest pain or significant shortness of breath.  She does note that occasionally she gets a little bit lightheaded, but  has not had any syncope.  She has had a difficult time being active due  to recent wrist and ankle fractures.   She is having problems with sore on the stump of her left leg at the  site of her prosthesis.  She denies any fevers or chills.   Review of systems is notable for occasional headaches, some dizziness,  and lightheadedness as mentioned above, constipation, anemia, and  fatigue.  She denies any focal neurologic symptoms.  No fevers  or  chills.  No nausea or vomiting.  Remainder of review of systems is  negative except for HPI and problem list.   PROBLEM LIST:  1. Coronary artery disease.      a.     Status post one-vessel bypass with a saphenous vein graft to       left circumflex in 2001.  Coronary artery is otherwise normal.       Normal left ventricular function.  2. Diabetes with related complications.  3. History of end-stage renal disease.      a.     Now status post cadaveric renal transplant in 2001 with       normal creatinine.  4. Peripheral arterial disease status post left below-knee amputation      in the setting of a Charcot joint.  5. History of hypertension.  6. Hypothyroidism.  7. Osteoporosis with recurrent fractures.  8. Hyperlipidemia.   CURRENT MEDICATIONS:  1. Prograf.  2. Prednisone 5 mg a day.  3. CellCept 1000 mg a day.  4. Synthroid 150 mcg a day.  5. Lipitor 5  mg every other day alternating with 10 mg every other      day.  6. Forteo injections.  7. Multivitamin.  8. Aspirin 325.  9. Magnesium oxide 400 a day.  10.Calcium plus D.  11.Lantus insulin.   ALLERGIES:  No known drug allergies.   SOCIAL HISTORY:  She is married with two kids.  She recently moved to  [place], [place].  She is a retired Engineer, civil (consulting).  Denies any alcohol  or tobacco use.   Family history is notable for mother died at 54 due to a stroke.  Father  died at 67 due to a heart attack.   PHYSICAL EXAMINATION:  She is somewhat chronically ill appearing, but no  acute distress.  She ambulates around the clinic slowly, but no  respiratory difficulty.  Blood pressure is 120/60, heart rate is 49, and  weight is 145.  HEENT is normal.  Neck is supple.  There is no JVD.  Carotids are 2+ bilaterally with bilateral bruits.  There is no  lymphadenopathy or thyromegaly appreciated.  Cardiac, PMI is  nondisplaced.  She is bradycardic and regular.  No obvious murmurs.  Lungs are clear.  Abdomen is soft,  nontender, and nondistended.  There  is hepatosplenomegaly, no bruits, no masses.  Extremities are warm.  She  is status post left BKA.  There is no cyanosis, clubbing, or edema.  On  the medial aspect of her left lower leg or at the site of her stump,  there is a sort of raised nodular area.  There is some surrounding  erythema.  There is mild oozing from this site, but no obvious abscess  or significant fluctuance.  There is no streaking.  Neuro, alert and  oriented x3.  Cranial nerves II-XII are intact.  Moves all fours  extremities without difficulty.  Affect is pleasant.   EKG shows sinus bradycardia at a rate of 49.  QRS is normal at 64  milliseconds.  No ST-T wave abnormalities.   ASSESSMENT/PLAN:  1. Coronary artery disease.  It is stable.  Continue current therapy.  2. Bradycardia.  This is borderline.  We will keep an eye on it.  I      have told her to keep track of her heart rates and she checks her      blood pressure.  She continues to be significantly bradycardic.  We      will put a 48-hour Holter monitor on her to further evaluate for      her sinus node function.  3. Carotid bruits.  She will need a followup ultrasound in 6 months      when she comes back to see [place].  4. Left leg sore.  I suspect this is a pressure wound.  There may be      some overlying infection.  I have talked to Dr. Darlina [name] in      Infectious Disease.  We will go ahead and start her on Septra one      double strength tablet b.i.d. and refer her to the Wound Care      Clinic for further evaluation.  If she obviously realized that this      is getting worse or she develops fevers or chills, she needs to      call us back immediately.     Elizabeth Buckles. Bensimhon, MD  Electronically Signed    DRB/MedQ  DD: 02/11/2008  DT: 02/12/2008  Job #: [id number]   cc:  [name] [name], M.D.  [name] [name], M.D.

## 2011-01-04 NOTE — Assessment & Plan Note (Signed)
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  She is status post kidney transplant in December 2001.  She also has  diabetes complicated by retinopathy, neuropathy, and nephropathy.  Remainder of history is notable for coronary artery disease status post  1-vessel bypass of her circumflex in 2001.  She also has a history of  peripheral vascular disease status post left BKA in the setting of a  Charcot joint.   She returns today for routine followup.  Overall, she is doing fairly  well.  She denies any chest pain or dyspnea, though she says she is not  overly active.  She previously was limited by ulcer on her prosthesis  stump, but this is resolved.  She had been taking her blood pressures at  home and they have been quite labile running from systolic range of 93-  170 with a mean of about 130, heart rates have been in the 47-61 range.  She denies any orthopnea, no PND, no lower extremity edema.   CURRENT MEDICATIONS:  1. Prednisone 5 a day.  2. CellCept 1000 a day.  3. Synthroid 150 mcg a day.  4. Lipitor 5 mg alternating with 10 mg a day.  5. Forteo injections.  6. Multivitamin.  7. Aspirin 325.  8. Magnesium 400 a day.  9. Calcium.  10.Vitamin D.  11.Lantus insulin.  12.Prograf 2 mg b.i.d.  13.Meclizine 25 daily.   PHYSICAL EXAMINATION:  GENERAL:  She is no acute distress, ambulates  around the clinic slowly with a cane.  No respiratory difficulty.  VITAL SIGNS:  Blood pressure is 110/60, heart rate is 53, weight is 150.  HEENT:  Normal.  NECK:  Supple.  No JVD.  Carotids are 2+ bilaterally with  bilateral  bruits.  There is no lymphadenopathy or thyromegaly.  CARDIAC:  PMI is nondisplaced.  She is bradycardic and regular.  She has  a 2/6 systolic ejection murmur at the left sternal border.  LUNGS:  Clear.  ABDOMEN:  Soft, nontender, nondistended.  No hepatosplenomegaly.  No  bruits.  No mass is noted.  EXTREMITIES.  She is status post left BKA.  She has a brace on the  right.  There is no cyanosis, clubbing, or edema.  NEURO:  Alert and oriented x3.  Cranial nerves II through XII are  intact.  Moves all 4 extremities without difficulty.  Affect is  pleasant.   EKG shows sinus bradycardia at the rate of 53.  QRS duration is normal  at 68 milliseconds.  PR interval is 160 milliseconds.   ASSESSMENT AND PLAN:  1. Coronary artery disease is stable.  No evidence of ischemia.      Continue current therapy with the exception of decreasing aspirin  down to 162 a day and she has an increased risk for GI bleeding due      to her chronic prednisone use.  2. Hypertension.  Blood pressures are labile.  Overall, they are      fairly well controlled.  I would not make any changes at this time      and she has had periods of hypotension.  3. Bradycardia.  She does not have any clear indications for pacing.      We discussed the possibility of putting Holter monitor on her and      see if she increases her heart rate with activity.  She will try to      take her blood pressure when she is ambulatory.  4. Carotid stenoses.  Most recent ultrasound shows 0-39% bilaterally.      We will follow these every couple of years.  5. Deconditioning suggested that she get involved with an exercise      plan.  We have written her a prescription for physical therapy.   DISPOSITION:  We will see her back in 6 months.     Elizabeth Buckles. Bensimhon, MD  Electronically Signed    DRB/MedQ  DD: 07/24/2008  DT: 07/25/2008  Job #: [id number]   cc:   Terrial Rhodes, M.D.  Physical Therapy at [facility]

## 2011-01-04 NOTE — Consult Note (Signed)
Elizabeth [name], Elizabeth [name]            ACCOUNT NO.:  [id number]   MEDICAL RECORD NO.:  [id number]          PATIENT TYPE:  EMS   LOCATION:  MAJO                         FACILITY:  [facility]   PHYSICIAN:  Madelynn Done, MD  DATE OF BIRTH:  1941/03/01   DATE OF CONSULTATION:  03/14/2007  DATE OF DISCHARGE:  03/14/2007                                 CONSULTATION   REQUESTING PHYSICIAN:  Dr. Deretha Emory, emergency department.   REASON FOR CONSULTATION:  Left distal radius and ulnar fracture.   HISTORY OF PRESENT ILLNESS:  Elizabeth [name] is a 70 year old right-hand-  dominant female who was in town visiting from [place], looking at  homes, and sustained a fall while she was at home-looking; she fell down  some steps and landed on her outstretched left wrist.  She sustained an  injury to her left wrist and presented to the emergency department with  pain and deformity to the left wrist.  I was consulted for the  management and treatment of her left distal radius injury.   The patient complains of pain to the left wrist.   PAST MEDICAL HISTORY:  1. Past medical history of type 2 diabetes, hyperlipidemia,      hypothyroidism, renal disease, status post transplant, chronic      immunosuppressive therapy.  2. Osteoporosis.   MEDICATIONS:  Prograf, Cellcept, prednisone, aspirin, Fosamax, Lipitor  and Synthroid.   ALLERGIES:  None.   SOCIAL HISTORY:  She is a nonsmoker and nondrinker.   PAST SURGICAL HISTORY:  1. Left lower extremity below-knee amputation.  2. Renal transplant.  3. Nonsurgical treatment, but long-term treatment for right lower      extremity tibial shaft fracture and Charcot arthropathy of the      right foot.   SOCIAL HISTORY:  Nonsmoker, nondrinker.  She is married.  She lives in  [place], [place].   PHYSICAL EXAMINATION:  GENERAL:  She is a frail-appearing white female.  VITAL SIGNS:  Blood pressure 148/65, heart rate 56, respirations 20 and  a temperature  of 98.2.  MENTAL STATUS;  She is alert and oriented to person, place and time, in  no acute distress.  EXTREMITIES:  On examination of the left upper extremity, she does not  have pain with passive flexion and extension of her elbow.  She does  have pain and deformity to her left wrist, no open wounds.  She has a  palpable radial pulse, Her hand is warm.  She does have atrophic changes  to the nails and skin from longstanding renal disease.  She also has a  grayish color to her skin.  She is able to make the A-Okay sign.  He is  able to abduct and adduct her long finger, extend her thumb from a palm  flat position and extend her digits.  Her fingertips are perfused with  good capillary refill.  She has pain with any motion of her wrist and  forearm rotation.   RADIOGRAPHY:  AP lateral and oblique films of the left wrist do show the  diffuse osteoporosis of both bones of her forearm and wrist.  She has  had calcifications of both the radial and ulnar arteries.  She does have  a displaced intra-articular comminuted distal radius fracture with volar  and dorsal comminution, with loss of her tilt and radial height.  She  has an associated ulnar head fracture.   IMPRESSION:  Left wrist intra-articular distal radius fracture and  distal ulnar fracture.   PLAN:  The injury was explained to the patient and the patient's family.  We talked about the displacement and how she may benefit from an attempt  at closed manipulation in order to better realign the wrist; the patient  agreed to proceed.  Lidocaine hematoma block with a 1% lidocaine, 10 mL,  was used.  The patient tolerated this well.  Using fingertraps traction  and counterweight, ligamentous axis and a closed manipulation were then  performed.  The patient was then placed in a sugar-tong splint.  The  patient tolerated the procedure well.  Postreduction radiographs do show  slight improvement in the height of the radius as well as  alignment on  the lateral.  She still has the dorsal and volar comminution and intra-  articular extension of the fracture.   PLAN:  The patient is returning back to [place].  She does have a  orthopedist there, who they were recommended to follow up with within  the next week.  This will likely be an injury that is treated closed,  given her multiple comorbidities, her bone density, her a chronic  immunosuppressive therapy and peripheral vascular disease.  We will  recommend pain medication, ice, elevation and no use of the left upper  extremity.  Her husband was with her as well as her daughter.  Their  questions were answered today.  She needs to return sooner if she starts  having worsening pain or swelling in the hand and wrist.      Madelynn Done, MD  Electronically Signed     FWO/MEDQ  D:  03/14/2007  T:  03/15/2007  Job:  [id number]

## 2011-01-05 ENCOUNTER — Encounter: Payer: Medicare Other | Admitting: Internal Medicine

## 2011-01-06 ENCOUNTER — Encounter: Payer: Self-pay | Admitting: Cardiovascular Disease

## 2011-01-06 ENCOUNTER — Ambulatory Visit (INDEPENDENT_AMBULATORY_CARE_PROVIDER_SITE_OTHER): Payer: Medicare Other | Admitting: Cardiovascular Disease

## 2011-01-06 VITALS — BP 66/44 | HR 57 | Ht 64.0 in

## 2011-01-06 DIAGNOSIS — R609 Edema, unspecified: Secondary | ICD-10-CM

## 2011-01-06 DIAGNOSIS — I10 Essential (primary) hypertension: Secondary | ICD-10-CM

## 2011-01-06 DIAGNOSIS — R42 Dizziness and giddiness: Secondary | ICD-10-CM

## 2011-01-06 DIAGNOSIS — I2510 Atherosclerotic heart disease of native coronary artery without angina pectoris: Secondary | ICD-10-CM

## 2011-01-06 DIAGNOSIS — E785 Hyperlipidemia, unspecified: Secondary | ICD-10-CM

## 2011-01-06 NOTE — Patient Instructions (Signed)
Your physician recommends that you schedule a follow-up appointment in: 6 months  

## 2011-01-10 DIAGNOSIS — R609 Edema, unspecified: Secondary | ICD-10-CM | POA: Insufficient documentation

## 2011-01-10 NOTE — Assessment & Plan Note (Signed)
She has significant orthostatic hypotension on today's visit. She is mildly symptomatic. Her husband reports and provides documentation of her blood pressures over the past month. She does have periods of hypertension with rare days of hypotension. We have suggested she try to increase her salt intake, increase her fluids. We can also try Ace wraps for the edema. If she continues to have symptoms, we could try Florinef.

## 2011-01-10 NOTE — Assessment & Plan Note (Signed)
Blood pressure on average tends to run high with systolic pressures in the 150s to 160 range. Today she is very low. She has had problems with orthostatic hypotension before. We will need to watch her closely.

## 2011-01-10 NOTE — Progress Notes (Signed)
Patient ID: Elizabeth [name], female    DOB: [birth year], 70 y.o.   MRN: [id number]  HPI Comments: Elizabeth [name] is a delightful 70 year old woman with a history of end-stage renal disease secondary to diabetic nephropathy, s/p  kidney transplant in December 2001 with normal creatinine,  retinopathy, neuropathy, and nephropathy, CAD, status post 1-vessel bypass of her circumflex in 2001, orthostatic hypotension, peripheral vascular disease status post left BKA in the setting of a Charcot joint, returns today for routine f/u.  previously admitted to [facility] in October 2011 with severe PNA. At that point peak Trop 0.19. Notes also indicate history of stroke, aortic atherosclerosis, hyperlipidemia, labile blood pressures.   Echo showed EF 50-55% with mild AI.   Carotid u/s 11/09. normal.     Post hospital had a near fall and bruised her L stump and was unable to walk easily for months. Participated in rehab.  Gets tired and mild dyspnea with mild exertion. She has had difficulty with orthostasis and because of this has been unable to participate in OT and PT. Her husband reports that she has dizziness with standing and is limited in her ability to perform therapy. Otherwise her appetite is good.  She did have a CVA in 9 weeks ago with symptoms of headache. She had vision problems, continues to have balance problems, and was told that she has vascular dementia. Notes indicate history of right lower extremity DVT.,   She suffered a muscular ligamental strain and head left-sided chest pain last month. She presented to the emergency room for evaluation and was admitted overnight. She was continued on her warfarin. Workup suggested muscular ligamental strain, Possible hairline fracture  EKG shows normal sinus rhythm with rate 56 beats per minute with T wave abnormality in leads V1 through V6       Review of Systems  Constitutional: Positive for fatigue.       Unable to ambulate secondary to dizziness with  standing.  HENT: Negative.   Eyes: Negative.   Respiratory: Negative.   Cardiovascular: Negative.   Gastrointestinal: Negative.   Musculoskeletal: Negative.   Skin: Negative.   Neurological: Positive for dizziness and weakness.  Hematological: Negative.   Psychiatric/Behavioral: Negative.   All other systems reviewed and are negative.    BP 66/44  Pulse 57  Ht 5\' 4"  (1.626 m)' Blood pressure with lying down is 96/52 with heart rate 56,  blood pressure sitting is 89/53 with heart rate 57,  blood pressure with standing 66/44  Physical Exam  Nursing note and vitals reviewed. Constitutional: She is oriented to person, place, and time. She appears well-developed and well-nourished.       Appears pale, sitting in a wheelchair, no apparent distress  HENT:  Head: Normocephalic.  Nose: Nose normal.  Mouth/Throat: Oropharynx is clear and moist.  Eyes: Conjunctivae are normal. Pupils are equal, round, and reactive to light.  Neck: Normal range of motion. Neck supple. No JVD present.  Cardiovascular: Normal rate, regular rhythm, S1 normal, S2 normal, normal heart sounds and intact distal pulses.  Exam reveals no gallop and no friction rub.   No murmur heard. Pulmonary/Chest: Effort normal and breath sounds normal. No respiratory distress. She has no wheezes. She has no rales. She exhibits no tenderness.  Abdominal: Soft. Bowel sounds are normal. She exhibits no distension. There is no tenderness.  Musculoskeletal: Normal range of motion. She exhibits no edema and no tenderness.  Lymphadenopathy:    She has no cervical adenopathy.  Neurological: She is  alert and oriented to person, place, and time. Coordination normal.  Skin: Skin is warm and dry. No rash noted. No erythema.  Psychiatric: She has a normal mood and affect. Her behavior is normal. Judgment and thought content normal.         Assessment and Plan

## 2011-01-10 NOTE — Assessment & Plan Note (Signed)
Etiology is likely multifactorial. She does have a history of DVTs, likely has venous insufficiency. We have suggested ACE wraps as she is unable to place TED hose.

## 2011-01-10 NOTE — Assessment & Plan Note (Signed)
I suggest she continue on her Lipitor for now given her history of coronary artery disease

## 2011-01-10 NOTE — Assessment & Plan Note (Signed)
Currently with no symptoms of angina. No further workup at this time. Continue current medication regimen. 

## 2011-01-20 ENCOUNTER — Encounter: Payer: Self-pay | Admitting: Internal Medicine

## 2011-01-21 ENCOUNTER — Encounter: Payer: Medicare Other | Admitting: Internal Medicine

## 2011-01-28 ENCOUNTER — Ambulatory Visit (INDEPENDENT_AMBULATORY_CARE_PROVIDER_SITE_OTHER): Payer: Self-pay | Admitting: Internal Medicine

## 2011-01-28 DIAGNOSIS — I82629 Acute embolism and thrombosis of deep veins of unspecified upper extremity: Secondary | ICD-10-CM

## 2011-01-28 DIAGNOSIS — Z7901 Long term (current) use of anticoagulants: Secondary | ICD-10-CM

## 2011-01-28 DIAGNOSIS — I639 Cerebral infarction, unspecified: Secondary | ICD-10-CM | POA: Insufficient documentation

## 2011-01-28 DIAGNOSIS — R0989 Other specified symptoms and signs involving the circulatory and respiratory systems: Secondary | ICD-10-CM

## 2011-01-28 DIAGNOSIS — I480 Paroxysmal atrial fibrillation: Secondary | ICD-10-CM | POA: Insufficient documentation

## 2011-01-28 DIAGNOSIS — I4891 Unspecified atrial fibrillation: Secondary | ICD-10-CM

## 2011-01-28 DIAGNOSIS — Z7189 Other specified counseling: Secondary | ICD-10-CM | POA: Insufficient documentation

## 2011-01-31 ENCOUNTER — Encounter: Payer: Self-pay | Admitting: Cardiovascular Disease

## 2011-02-02 ENCOUNTER — Ambulatory Visit (INDEPENDENT_AMBULATORY_CARE_PROVIDER_SITE_OTHER): Payer: Self-pay | Admitting: Cardiovascular Disease

## 2011-02-02 DIAGNOSIS — R0989 Other specified symptoms and signs involving the circulatory and respiratory systems: Secondary | ICD-10-CM

## 2011-02-08 ENCOUNTER — Telehealth: Payer: Self-pay | Admitting: Emergency Medicine

## 2011-02-08 NOTE — Telephone Encounter (Signed)
[facility] is calling regarding patient's INR. INR is 1.5. She is currently taking 2.5mg  daily.

## 2011-02-09 ENCOUNTER — Ambulatory Visit (INDEPENDENT_AMBULATORY_CARE_PROVIDER_SITE_OTHER): Payer: Self-pay | Admitting: Internal Medicine

## 2011-02-09 DIAGNOSIS — R0989 Other specified symptoms and signs involving the circulatory and respiratory systems: Secondary | ICD-10-CM

## 2011-02-09 NOTE — Telephone Encounter (Signed)
Result noted, see anticoagulation encounter.  EWJ

## 2011-02-11 ENCOUNTER — Telehealth: Payer: Self-pay | Admitting: Internal Medicine

## 2011-02-11 NOTE — Telephone Encounter (Signed)
Pt hr was 46-48 today and they just wanted to let us know and she can not take any med orders so if it is a medication change please call the family.

## 2011-02-11 NOTE — Telephone Encounter (Signed)
SPOKE WITH [facility] RE MESSAGE  HR RUNNING LOW TODAY 46-48  ONLY RATE MED WAS  AMIODARONE 200 MG QOD  NOTED  IN MED LIST   B/P OKAY TODAY WILL FORWARD TO MD FOR RECOMMENDATIONS .Zack Seal

## 2011-02-13 ENCOUNTER — Telehealth: Payer: Self-pay | Admitting: Internal Medicine

## 2011-02-13 NOTE — Telephone Encounter (Signed)
Decrease amio to 100qd.

## 2011-02-14 ENCOUNTER — Other Ambulatory Visit: Payer: Self-pay | Admitting: Cardiovascular Disease

## 2011-02-14 MED ORDER — AMIODARONE HCL 200 MG PO TABS: 100.0000 mg | ORAL_TABLET | Freq: Every day | ORAL | Status: DC

## 2011-02-14 NOTE — Telephone Encounter (Signed)
Addended by: Noralee Space on: 02/14/2011 06:02 PM   Modules accepted: Orders

## 2011-02-14 NOTE — Telephone Encounter (Signed)
Per Dr Gala Romney decrease amio to 100 mg daily, pt is aware

## 2011-02-14 NOTE — Telephone Encounter (Signed)
Needs to be consulted on the amount of Warfarin to take.  Pt's spouse states that the Seroquel should be 2 times a day.

## 2011-02-14 NOTE — Telephone Encounter (Signed)
Pt aware.

## 2011-02-15 NOTE — Telephone Encounter (Addendum)
Spoke to patient's spouse and he wants to hold off on the refills since pt will be seeing [facility] Thursday. Pt's spouse is concerned about the pt's coumadin and he wants to know which mg tablet would work best for pt. Notified husband that if pt has to be on a dosage that their current mg tablet does not work with, then we can give them samples to them to hold them over until patient is therapeutic. Husband verbalized understanding.

## 2011-02-16 ENCOUNTER — Ambulatory Visit (INDEPENDENT_AMBULATORY_CARE_PROVIDER_SITE_OTHER): Payer: Self-pay | Admitting: Cardiovascular Disease

## 2011-02-16 DIAGNOSIS — R0989 Other specified symptoms and signs involving the circulatory and respiratory systems: Secondary | ICD-10-CM

## 2011-02-17 ENCOUNTER — Telehealth: Payer: Self-pay

## 2011-02-17 ENCOUNTER — Encounter: Payer: Self-pay | Admitting: Cardiovascular Disease

## 2011-02-17 ENCOUNTER — Ambulatory Visit (INDEPENDENT_AMBULATORY_CARE_PROVIDER_SITE_OTHER): Payer: Medicare Other | Admitting: Cardiovascular Disease

## 2011-02-17 DIAGNOSIS — E785 Hyperlipidemia, unspecified: Secondary | ICD-10-CM

## 2011-02-17 DIAGNOSIS — R42 Dizziness and giddiness: Secondary | ICD-10-CM

## 2011-02-17 DIAGNOSIS — R609 Edema, unspecified: Secondary | ICD-10-CM

## 2011-02-17 DIAGNOSIS — I10 Essential (primary) hypertension: Secondary | ICD-10-CM

## 2011-02-17 DIAGNOSIS — I2510 Atherosclerotic heart disease of native coronary artery without angina pectoris: Secondary | ICD-10-CM

## 2011-02-17 DIAGNOSIS — I495 Sick sinus syndrome: Secondary | ICD-10-CM

## 2011-02-17 DIAGNOSIS — I4891 Unspecified atrial fibrillation: Secondary | ICD-10-CM

## 2011-02-17 MED ORDER — WARFARIN SODIUM 2.5 MG PO TABS: ORAL_TABLET | ORAL | Status: DC

## 2011-02-17 MED ORDER — FLUDROCORTISONE ACETATE 0.1 MG PO TABS: 0.1000 mg | ORAL_TABLET | Freq: Two times a day (BID) | ORAL | Status: DC

## 2011-02-17 NOTE — Assessment & Plan Note (Signed)
Continue Lipitor. Periodic check of her cholesterol. Goal LDL less than 70

## 2011-02-17 NOTE — Progress Notes (Signed)
Patient ID: Elizabeth [name], female    DOB: 1941-02-05, 70 y.o.   MRN: [id number]  HPI Comments: Elizabeth [name] is a delightful 70 year old woman with a history of end-stage renal disease secondary to diabetic nephropathy, s/p  kidney transplant in December 2001 with normal creatinine,  retinopathy, neuropathy, and nephropathy, CAD, status post 1-vessel bypass of her circumflex in 2001, orthostatic hypotension, peripheral vascular disease status post left BKA in the setting of a Charcot joint, returns today for routine f/u.  previously admitted to [facility] in October 2011 with severe PNA. At that point peak Trop 0.19. Atrial fibrillation during her hospital course, started on amiodarone.  Notes also indicate history of stroke, aortic atherosclerosis, hyperlipidemia, labile blood pressures With severe orthostatic hypotension.   CVA  with symptoms of headache. She had vision problems, continues to have balance problems, and was told that she has vascular dementia. Notes indicate history of right lower extremity DVT.,   Since her last clinic visit, she has started to do better, working with physical therapy. Florinef was started 0.1 mg daily and she left the rehabilitation facility and now drinks more fluids. She continues to have occasional dizziness though not as significant as before. Blood pressure measurements show systolic pressures in the 130s, occasional high 90s with standing though this is more rare. On her last clinic visit, she had a systolic pressure in the 70s on no Florinef.   Echo showed EF 50-55% with mild AI.    Carotid u/s 11/09. normal.     EKG shows normal Bradycardia with rate 42 beats per minute, no significant ST or T wave changes        Review of Systems  Constitutional: Positive for fatigue.  HENT: Negative.   Eyes: Negative.   Respiratory: Positive for shortness of breath.   Cardiovascular: Positive for leg swelling.  Gastrointestinal: Negative.   Musculoskeletal:  Positive for gait problem.  Skin: Negative.   Neurological: Positive for dizziness and weakness.  Hematological: Negative.   Psychiatric/Behavioral: Negative.   All other systems reviewed and are negative.    BP 100/59  Pulse 41  Ht 5\' 4"  (1.626 m)  Physical Exam  Nursing note and vitals reviewed. Constitutional: She is oriented to person, place, and time. She appears well-developed and well-nourished.       Pale appearing woman in a wheelchair, appears weak. Unable to climb out of the wheelchair to the table  HENT:  Head: Normocephalic.  Nose: Nose normal.  Mouth/Throat: Oropharynx is clear and moist.  Eyes: Conjunctivae are normal. Pupils are equal, round, and reactive to light.  Neck: Normal range of motion. Neck supple. No JVD present.  Cardiovascular: Normal rate, regular rhythm, S1 normal, S2 normal, normal heart sounds and intact distal pulses.  Exam reveals no gallop and no friction rub.   No murmur heard. Pulmonary/Chest: Effort normal and breath sounds normal. No respiratory distress. She has no wheezes. She has no rales. She exhibits no tenderness.  Abdominal: Soft. Bowel sounds are normal. She exhibits no distension. There is no tenderness.  Musculoskeletal: Normal range of motion. She exhibits no edema and no tenderness.       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  Neurological: She is alert and oriented to person, place, and time. Coordination normal.  Skin: Skin is warm and dry. No rash noted. No erythema.  Psychiatric: She has a normal mood and  affect. Her behavior is normal. Judgment and thought content normal.         Assessment and Plan

## 2011-02-17 NOTE — Assessment & Plan Note (Signed)
Improved orthostatic hypotension. We will increase the Florinef to 0.2 mg 3 times per week, 0.1 mg on other days. We could advance this as the blood pressure tolerates in an effort to improve her dizziness with standing.

## 2011-02-17 NOTE — Assessment & Plan Note (Signed)
We will hold her amiodarone given her significant bradycardia. If she continues to be bradycardic, we may need to review other medications. Uncertain if she is having side effects from Seroquel

## 2011-02-17 NOTE — Assessment & Plan Note (Signed)
Blood pressure at rest is in an adequate range. We are advancing Florinef and will monitor the blood pressure for hypertension

## 2011-02-17 NOTE — Telephone Encounter (Signed)
Needs a refill on warfarin 2.5 mg takes as directed by coumadin clinic for 180 tablets with 3 refills.

## 2011-02-17 NOTE — Assessment & Plan Note (Signed)
Edema of her right lower extremity. Unable to tolerate TED hose. We have encouraged leg elevation and Ace wraps.

## 2011-02-17 NOTE — Patient Instructions (Addendum)
Your physician has recommended you make the following change in your medication: STOP taking Amiodarone. CHANGE Florinef to 0.1mg  every day EXCEPT take 2 tablets on Mondays, Wednesdays, Fridays.   Your physician recommends that you schedule a follow-up appointment in: 6 weeks

## 2011-02-17 NOTE — Assessment & Plan Note (Signed)
Currently with no symptoms of angina. No further workup at this time. Continue current medication regimen. 

## 2011-02-20 ENCOUNTER — Encounter: Payer: Medicare Other | Admitting: Internal Medicine

## 2011-02-22 ENCOUNTER — Ambulatory Visit (INDEPENDENT_AMBULATORY_CARE_PROVIDER_SITE_OTHER): Payer: Self-pay | Admitting: Cardiovascular Disease

## 2011-02-22 DIAGNOSIS — I4891 Unspecified atrial fibrillation: Secondary | ICD-10-CM

## 2011-03-04 ENCOUNTER — Ambulatory Visit (INDEPENDENT_AMBULATORY_CARE_PROVIDER_SITE_OTHER): Payer: Self-pay | Admitting: Cardiology

## 2011-03-04 DIAGNOSIS — R0989 Other specified symptoms and signs involving the circulatory and respiratory systems: Secondary | ICD-10-CM

## 2011-03-04 LAB — POCT INR: INR: 2.5

## 2011-03-16 ENCOUNTER — Ambulatory Visit (INDEPENDENT_AMBULATORY_CARE_PROVIDER_SITE_OTHER): Payer: Medicare Other | Admitting: Emergency Medicine

## 2011-03-16 DIAGNOSIS — I6350 Cerebral infarction due to unspecified occlusion or stenosis of unspecified cerebral artery: Secondary | ICD-10-CM

## 2011-03-16 DIAGNOSIS — I82629 Acute embolism and thrombosis of deep veins of unspecified upper extremity: Secondary | ICD-10-CM

## 2011-03-16 DIAGNOSIS — Z7901 Long term (current) use of anticoagulants: Secondary | ICD-10-CM

## 2011-03-16 DIAGNOSIS — I4891 Unspecified atrial fibrillation: Secondary | ICD-10-CM

## 2011-03-16 DIAGNOSIS — I639 Cerebral infarction, unspecified: Secondary | ICD-10-CM

## 2011-03-23 ENCOUNTER — Encounter: Payer: Medicare Other | Admitting: Internal Medicine

## 2011-03-31 ENCOUNTER — Encounter: Payer: Self-pay | Admitting: Cardiovascular Disease

## 2011-03-31 ENCOUNTER — Ambulatory Visit (INDEPENDENT_AMBULATORY_CARE_PROVIDER_SITE_OTHER): Payer: Medicare Other | Admitting: Cardiovascular Disease

## 2011-03-31 DIAGNOSIS — E785 Hyperlipidemia, unspecified: Secondary | ICD-10-CM

## 2011-03-31 DIAGNOSIS — R42 Dizziness and giddiness: Secondary | ICD-10-CM

## 2011-03-31 DIAGNOSIS — I2510 Atherosclerotic heart disease of native coronary artery without angina pectoris: Secondary | ICD-10-CM

## 2011-03-31 DIAGNOSIS — I4891 Unspecified atrial fibrillation: Secondary | ICD-10-CM

## 2011-03-31 DIAGNOSIS — R3581 Nocturnal polyuria: Secondary | ICD-10-CM

## 2011-03-31 DIAGNOSIS — I10 Essential (primary) hypertension: Secondary | ICD-10-CM

## 2011-03-31 DIAGNOSIS — R351 Nocturia: Secondary | ICD-10-CM

## 2011-03-31 DIAGNOSIS — I495 Sick sinus syndrome: Secondary | ICD-10-CM

## 2011-03-31 DIAGNOSIS — I951 Orthostatic hypotension: Secondary | ICD-10-CM

## 2011-03-31 DIAGNOSIS — R112 Nausea with vomiting, unspecified: Secondary | ICD-10-CM | POA: Insufficient documentation

## 2011-03-31 MED ORDER — FLUDROCORTISONE ACETATE 0.1 MG PO TABS: 0.1000 mg | ORAL_TABLET | Freq: Every day | ORAL | Status: DC

## 2011-03-31 NOTE — Assessment & Plan Note (Signed)
Cholesterol is at goal on the current lipid regimen. No changes to the medications were made.  

## 2011-03-31 NOTE — Assessment & Plan Note (Signed)
Etiology of her nausea and vomiting is uncertain. There is concern for gastroparesis. They are concerned about the Florinef though they have been on this medication for some time with only recent development of the nausea. As her dizziness has improved, I have suggested they can decrease the Florinef to 0.1 mg every other day To see if this improves her symptoms.  If this does not work, I suggested increasing the Prevacid to b.i.d.. We could also try Reglan for possible gastroparesis.

## 2011-03-31 NOTE — Assessment & Plan Note (Signed)
Currently with no symptoms of angina. No further workup at this time. Continue current medication regimen. 

## 2011-03-31 NOTE — Progress Notes (Signed)
Patient ID: Elizabeth [name], female    DOB: [birth year], 70 y.o.   MRN: [id number]  HPI Comments: [name]  is a 70 year old woman with a history of end-stage renal disease secondary to diabetic nephropathy, s/p  kidney transplant in December 2001 with normal creatinine,  retinopathy, neuropathy, and nephropathy, CAD, status post 1-vessel bypass of her circumflex in 2001, chronic orthostatic hypotension, peripheral vascular disease status post left BKA in the setting of a Charcot joint,   previously admitted to [facility] in October 2011 with severe PNA,  peak Trop 0.19, Atrial fibrillation during her hospital course, started on amiodarone, With discontinuation of her amiodarone secondary to bradycardia and orthostatic hypotension. Florinef was started earlier this year with improvement of her symptoms and the ability to work with physical therapy.  Notes also indicate history of stroke, aortic atherosclerosis, hyperlipidemia, labile blood pressures  She has vision problems, continues to have balance problems, and was told that she has vascular dementia. Notes indicate history of right lower extremity DVT.  She has graduated from physical therapy. She has not set herself up and any rehabilitation or exercise program. She continues to have severe weakness of her right lower extremity. The dizziness has improved on Florinef and on her last visit, we had suggested taking 0.2 mg 3 times a week and 0.1 mg on other days. She had worsening right lower extremity edema on this regimen and her husband decrease the dose to 0.1 mg daily with improvement of her edema.   Their main complaint today is that she has polyuria, nocturia. Also has nausea in the morning of uncertain etiology. Morning nausea with occasional vomiting his prior to any medications or eating.  Much less dizziness and general and able to stand and exert herself. Heart rate has improved without any odor around. Rate has moved from the low 40s to  high 40s, 50 range.   Echo showed EF 50-55% with mild AI.     Carotid u/s 11/09. normal.     EKG shows normal Bradycardia with rate 50 beats per minute, Nonspecific T wave abnormality in leads V6, one and aVL    Outpatient Encounter Prescriptions as of 03/31/2011  Medication Sig Dispense Refill  . acetaminophen (TYLENOL) 650 MG CR tablet Take 650 mg by mouth every 8 (eight) hours as needed.        Marland Kitchen atorvastatin (LIPITOR) 10 MG tablet Take 10 mg by mouth every other day.        . Calcium Carbonate-Vitamin D (CALCIUM 600-D) 600-400 MG-UNIT per tablet Take 1 tablet by mouth 2 (two) times daily.        . fludrocortisone (FLORINEF) 0.1 MG tablet Take 1 tablet (0.1 mg total) by mouth daily.  90 tablet  3  . HYDROcodone-acetaminophen (NORCO) 5-325 MG per tablet Take 1 tablet by mouth every 6 (six) hours as needed.        . insulin glargine (LANTUS) 100 UNIT/ML injection Inject 12 Units into the skin every morning.        . insulin lispro (HUMALOG) 100 UNIT/ML injection Inject into the skin as directed.        Marland Kitchen levothyroxine (SYNTHROID, LEVOTHROID) 150 MCG tablet Take 150 mcg by mouth daily.        . Multiple Vitamin (TAB-A-VITE PO) Take 0.5 tablets by mouth daily.        . mycophenolate (MYFORTIC) 360 MG TBEC Take 360 mg by mouth 2 (two) times daily.        Marland Kitchen  omeprazole (PRILOSEC) 20 MG capsule Take 20 mg by mouth daily.        . predniSONE (DELTASONE) 5 MG tablet Take 5 mg by mouth daily.        . tacrolimus (PROGRAF) 0.5 MG capsule Take 0.5 mg by mouth as directed. 4 capsules every morning and 1mg  every evening       . warfarin (COUMADIN) 2.5 MG tablet Take as directed by the coumadin clinic.  180 tablet  3     Review of Systems  HENT: Negative.   Eyes: Negative.   Respiratory: Negative.   Cardiovascular: Positive for leg swelling.  Gastrointestinal: Positive for nausea.  Musculoskeletal: Positive for gait problem.  Skin: Negative.   Neurological: Positive for dizziness and weakness.    Hematological: Negative.   Psychiatric/Behavioral: Negative.   All other systems reviewed and are negative.    BP 122/64  Pulse 50  Ht 5\' 3"  (1.6 m)   Physical Exam  Nursing note and vitals reviewed. Constitutional: She is oriented to person, place, and time. She appears well-developed and well-nourished.       Appears pale. Sitting in a wheelchair, right leg has a wrap in place. 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   HENT:  Head: Normocephalic.  Nose: Nose normal.  Mouth/Throat: Oropharynx is clear and moist.  Eyes: Conjunctivae are normal. Pupils are equal, round, and reactive to light.  Neck: Normal range of motion. Neck supple. No JVD present.  Cardiovascular: Normal rate, regular rhythm, S1 normal, S2 normal, normal heart sounds and intact distal pulses.  Exam reveals no gallop and no friction rub.   No murmur heard. Pulmonary/Chest: Effort normal and breath sounds normal. No respiratory distress. She has no wheezes. She has no rales. She exhibits no tenderness.  Abdominal: Soft. Bowel sounds are normal. She exhibits no distension. There is no tenderness.  Musculoskeletal: Normal range of motion. She exhibits no edema and no tenderness.  Lymphadenopathy:    She has no cervical adenopathy.  Neurological: She is alert and oriented to person, place, and time. Coordination normal.  Skin: Skin is warm and dry. No rash noted. No erythema.  Psychiatric: She has a normal mood and affect. Her behavior is normal. Judgment and thought content normal.         Assessment and Plan

## 2011-03-31 NOTE — Assessment & Plan Note (Signed)
Heart rate has improved by holding amiodarone. She seems to be feeling better with better strength and less orthostatic hypotension. No further medication changes made.

## 2011-03-31 NOTE — Assessment & Plan Note (Signed)
Blood pressure has improved on Florinef. Her husband has charted the measurements and there is no significant hypotension noted. Again we will try with a lower dose Florinef given other symptoms.

## 2011-03-31 NOTE — Patient Instructions (Signed)
For nausea, try florinef every other day Monitor symptoms of dizziness Could try prilosec at night or twice a day Could try reglan  Please schedule a follow up with urology Please call us if you have new issues that need to be addressed before your next appt.  We will call you for a follow up Appt. In 6 months

## 2011-03-31 NOTE — Assessment & Plan Note (Signed)
She reports having terrible sleep as she has to urinate once an hour if not more during the evening. Her husband has broken sleep because of this. Uncertain if she has an overactive bladder. We have asked her to limit her fluid intake as she gets closer to evening time. Possibly her polyuria could improve with less Florinef. She does have a urologist and have asked her to followup with their office. It was a cream that she was tried on previously that she has retry with no improvement of her symptoms. Uncertain what this medication was.

## 2011-04-06 ENCOUNTER — Ambulatory Visit (INDEPENDENT_AMBULATORY_CARE_PROVIDER_SITE_OTHER): Payer: Medicare Other | Admitting: Emergency Medicine

## 2011-04-06 DIAGNOSIS — I4891 Unspecified atrial fibrillation: Secondary | ICD-10-CM

## 2011-04-06 DIAGNOSIS — I639 Cerebral infarction, unspecified: Secondary | ICD-10-CM

## 2011-04-06 DIAGNOSIS — I82629 Acute embolism and thrombosis of deep veins of unspecified upper extremity: Secondary | ICD-10-CM

## 2011-04-06 DIAGNOSIS — Z7901 Long term (current) use of anticoagulants: Secondary | ICD-10-CM

## 2011-04-07 ENCOUNTER — Telehealth: Payer: Self-pay | Admitting: Cardiovascular Disease

## 2011-04-07 NOTE — Telephone Encounter (Signed)
Dr Mariah Milling sent a referral for Cardiac Rehab. Harriett Sine from [facility] Rehab thinks that the order needs to be for PT and not cardiac or pulmonary. Please call Harriett Sine at [phone] and discuss this with her

## 2011-04-08 NOTE — Telephone Encounter (Signed)
I spoke to Elizabeth [name], at pt's level, she will need PT prior to any physical fitness program, and since not cardiac issue, they will transfer her to "forever fit" after she meets criteria through PT. She is faxing me an order form for PT at [facility], and once criteria met, they will send you a report stating they will transfer to fitness program. Just FYI. I will contact pt to make aware. Thanks.

## 2011-04-20 ENCOUNTER — Other Ambulatory Visit: Payer: Self-pay | Admitting: Dermatology

## 2011-05-02 ENCOUNTER — Encounter: Payer: Medicare Other | Admitting: Cardiovascular Disease

## 2011-05-04 ENCOUNTER — Ambulatory Visit (INDEPENDENT_AMBULATORY_CARE_PROVIDER_SITE_OTHER): Payer: Medicare Other | Admitting: Emergency Medicine

## 2011-05-04 DIAGNOSIS — I4891 Unspecified atrial fibrillation: Secondary | ICD-10-CM

## 2011-05-04 DIAGNOSIS — I639 Cerebral infarction, unspecified: Secondary | ICD-10-CM

## 2011-05-04 DIAGNOSIS — Z7901 Long term (current) use of anticoagulants: Secondary | ICD-10-CM

## 2011-05-04 DIAGNOSIS — I6350 Cerebral infarction due to unspecified occlusion or stenosis of unspecified cerebral artery: Secondary | ICD-10-CM

## 2011-05-04 DIAGNOSIS — I82629 Acute embolism and thrombosis of deep veins of unspecified upper extremity: Secondary | ICD-10-CM

## 2011-05-18 ENCOUNTER — Ambulatory Visit (INDEPENDENT_AMBULATORY_CARE_PROVIDER_SITE_OTHER): Payer: Medicare Other | Admitting: Emergency Medicine

## 2011-05-18 DIAGNOSIS — I82629 Acute embolism and thrombosis of deep veins of unspecified upper extremity: Secondary | ICD-10-CM

## 2011-05-18 DIAGNOSIS — I4891 Unspecified atrial fibrillation: Secondary | ICD-10-CM

## 2011-05-18 DIAGNOSIS — I6350 Cerebral infarction due to unspecified occlusion or stenosis of unspecified cerebral artery: Secondary | ICD-10-CM

## 2011-05-18 DIAGNOSIS — Z7901 Long term (current) use of anticoagulants: Secondary | ICD-10-CM

## 2011-05-18 DIAGNOSIS — I639 Cerebral infarction, unspecified: Secondary | ICD-10-CM

## 2011-05-18 LAB — POCT INR: INR: 1.6

## 2011-05-23 ENCOUNTER — Encounter: Payer: Medicare Other | Admitting: Cardiovascular Disease

## 2011-05-25 ENCOUNTER — Encounter: Payer: Medicare Other | Admitting: Emergency Medicine

## 2011-05-25 ENCOUNTER — Ambulatory Visit (INDEPENDENT_AMBULATORY_CARE_PROVIDER_SITE_OTHER): Payer: Medicare Other | Admitting: *Deleted

## 2011-05-25 ENCOUNTER — Telehealth: Payer: Self-pay | Admitting: *Deleted

## 2011-05-25 ENCOUNTER — Ambulatory Visit (INDEPENDENT_AMBULATORY_CARE_PROVIDER_SITE_OTHER): Payer: Medicare Other | Admitting: Emergency Medicine

## 2011-05-25 DIAGNOSIS — I639 Cerebral infarction, unspecified: Secondary | ICD-10-CM

## 2011-05-25 DIAGNOSIS — I82629 Acute embolism and thrombosis of deep veins of unspecified upper extremity: Secondary | ICD-10-CM

## 2011-05-25 DIAGNOSIS — R001 Bradycardia, unspecified: Secondary | ICD-10-CM

## 2011-05-25 DIAGNOSIS — I4891 Unspecified atrial fibrillation: Secondary | ICD-10-CM

## 2011-05-25 DIAGNOSIS — Z7901 Long term (current) use of anticoagulants: Secondary | ICD-10-CM

## 2011-05-25 DIAGNOSIS — I498 Other specified cardiac arrhythmias: Secondary | ICD-10-CM

## 2011-05-25 DIAGNOSIS — I6350 Cerebral infarction due to unspecified occlusion or stenosis of unspecified cerebral artery: Secondary | ICD-10-CM

## 2011-05-25 NOTE — Telephone Encounter (Signed)
PT called from [facility], stated pt's HR has been lower each day she has come in. When first started PT HR 52 (normally she would not do therapy typically, but with pt asymptomatic they have continued). Today at PT, HR 46, pt stated when she woke up it was 44. She does not c/o any symptoms, however, she is not on any meds to slow HR. Amiodarone was d/c-ed 01/2011 for bradycardia. She does have orthostatic hypotension which she takes Florinef 0.1mg  QOD, has worked well, still may have some low BP when walking at PT, otherwise ok. I told pt to come in after her session today for EKG to see rhythm, will discuss with Dr. Mariah Milling and call her back with any rec's. Pt is to call office if any symptoms occur in the meantime.

## 2011-05-25 NOTE — Telephone Encounter (Signed)
She does run low. [place] [facility] would only place a pacemaker if she has symptoms. We could have her meet with Dr. Graciela Husbands after a holter.

## 2011-05-26 NOTE — Telephone Encounter (Signed)
Pt aware, she is not symptomatic and was not at visit yesterday. Since HR lower than her 'normal low' at PT session, EKG done just to make sure no other changes present on EKG. Pt will call with any changes.

## 2011-06-08 ENCOUNTER — Ambulatory Visit (INDEPENDENT_AMBULATORY_CARE_PROVIDER_SITE_OTHER): Payer: Medicare Other | Admitting: Emergency Medicine

## 2011-06-08 DIAGNOSIS — I6350 Cerebral infarction due to unspecified occlusion or stenosis of unspecified cerebral artery: Secondary | ICD-10-CM

## 2011-06-08 DIAGNOSIS — Z7901 Long term (current) use of anticoagulants: Secondary | ICD-10-CM

## 2011-06-08 DIAGNOSIS — I4891 Unspecified atrial fibrillation: Secondary | ICD-10-CM

## 2011-06-08 DIAGNOSIS — I639 Cerebral infarction, unspecified: Secondary | ICD-10-CM

## 2011-06-08 DIAGNOSIS — I82629 Acute embolism and thrombosis of deep veins of unspecified upper extremity: Secondary | ICD-10-CM

## 2011-06-22 ENCOUNTER — Ambulatory Visit (INDEPENDENT_AMBULATORY_CARE_PROVIDER_SITE_OTHER): Payer: Medicare Other | Admitting: Internal Medicine

## 2011-06-22 DIAGNOSIS — R109 Unspecified abdominal pain: Secondary | ICD-10-CM

## 2011-06-22 DIAGNOSIS — D136 Benign neoplasm of pancreas: Secondary | ICD-10-CM

## 2011-06-22 DIAGNOSIS — K5900 Constipation, unspecified: Secondary | ICD-10-CM

## 2011-06-22 DIAGNOSIS — Z86010 Personal history of colonic polyps: Secondary | ICD-10-CM

## 2011-06-22 DIAGNOSIS — K589 Irritable bowel syndrome without diarrhea: Secondary | ICD-10-CM

## 2011-06-22 MED ORDER — BISACODYL 5 MG PO TBEC: DELAYED_RELEASE_TABLET | ORAL | Status: DC

## 2011-06-22 MED ORDER — POLYETHYLENE GLYCOL 3350 17 GM/SCOOP PO POWD: 17.0000 g | Freq: Every day | ORAL | Status: AC

## 2011-06-22 NOTE — Progress Notes (Signed)
Subjective:    Patient ID: Elizabeth [name], female    DOB: November 09, 1940, 70 y.o.   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 This has been followed with imaging. She has been to Gen. surgery to see Dr. Jacquenette Shone as well. She had  A series strokes in March Recovering but still has balance difficulty and visual deficits  She is complaining of  abdominal cramps with some sudden urges to defecate but mostly just cramps. Dr. Arrie Aran prescribed diphenoxylate/atropine  But it constipates her so she does not take it. Upon further discussion she indicates in general she is constipated and normally moves her bowels 2 or 3 times a week. She believes that she has better bowel movements her abdominal symptoms would be less if not resolved.  Past Medical History  Diagnosis Date  . Coronary artery disease   . Chronic kidney disease   . Diabetes mellitus   . PVD (peripheral vascular disease)   . History of orthostatic hypotension   . Hyperlipidemia   . Hypertension   . Hypothyroidism   . Anemia   . Pancreatic cyst   . Osteoporosis   . Melanoma   . Carotid bruit     left  . Adenomatous polyp   . GERD (gastroesophageal reflux disease)   . Gastritis   . CVA (cerebral infarction)    Past Surgical History  Procedure Date  . Kidney transplant   . Below knee leg amputation     left  . Appendectomy   . Cataract extraction   . Cholecystectomy   . Abdominal hysterectomy   . Thyroid surgery   . Tonsillectomy   . Cesarean section     x 2  . Vitrectomy     right eye  . Coronary artery bypass graft   . Colonoscopy w/ biopsies and polypectomy 02/18/2009    adenomatous polyps, diverticulosis, internal hemorrhoids  . Eus 02/04/2010    w/FNA, pancreatic cyst    reports that she has never smoked. She has never used smokeless tobacco. She reports that she does not  drink alcohol or use illicit drugs. family history includes Heart disease in her mother. No Known Allergies    Current outpatient prescriptions:acetaminophen (TYLENOL) 650 MG CR tablet, Take 650 mg by mouth every 8 (eight) hours as needed.  , Disp: , Rfl: ;  atorvastatin (LIPITOR) 10 MG tablet, Take 10 mg by mouth every other day.  , Disp: , Rfl: ;  Calcium Carbonate-Vitamin D (CALCIUM 600-D) 600-400 MG-UNIT per tablet, Take 1 tablet by mouth 2 (two) times daily.  , Disp: , Rfl:  fludrocortisone (FLORINEF) 0.1 MG tablet, Take 0.1 mg by mouth every other day.  , Disp: , Rfl: ;  HYDROcodone-acetaminophen (NORCO) 5-325 MG per tablet, Take 1 tablet by mouth every 6 (six) hours as needed.  , Disp: , Rfl: ;  insulin glargine (LANTUS) 100 UNIT/ML injection, Inject 10 Units into the skin every morning. , Disp: , Rfl: ;  insulin lispro (HUMALOG) 100 UNIT/ML injection, Inject 12 Units into the skin as directed. , Disp: , Rfl:  levothyroxine (SYNTHROID, LEVOTHROID) 150 MCG tablet, Take 150 mcg by mouth daily.  , Disp: , Rfl: ;  Multiple Vitamin (TAB-A-VITE PO), Take 0.5 tablets by mouth daily.  , Disp: , Rfl: ;  mycophenolate (MYFORTIC) 360 MG TBEC, Take 360 mg by mouth 2 (two) times daily.  ,  Disp: , Rfl: ;  omeprazole (PRILOSEC) 20 MG capsule, Take 20 mg by mouth daily.  , Disp: , Rfl:  Oxybutynin Chloride (GELNIQUE) 10 % GEL, Place 1 application onto the skin daily.  , Disp: , Rfl: ;  predniSONE (DELTASONE) 5 MG tablet, Take 5 mg by mouth daily.  , Disp: , Rfl: ;  tacrolimus (PROGRAF) 0.5 MG capsule, Take 0.5 mg by mouth as directed. 4 capsules every morning and 1mg  every evening , Disp: , Rfl: ;  warfarin (COUMADIN) 2.5 MG tablet, Take as directed by the coumadin clinic., Disp: 180 tablet, Rfl: 3 bisacodyl (DULCOLAX) 5 MG EC tablet, 1 by mouth every 2-3 days as needed to promote bowel movement, Disp: 30 tablet, Rfl: 0;  polyethylene glycol powder (MIRALAX) powder, Take 17 g by mouth daily., Disp: 255 g, Rfl:  0   Review of Systems As above    Objective:   Physical Exam Elderly and chronically ill, in a wheelchair       Assessment & Plan:

## 2011-06-22 NOTE — Assessment & Plan Note (Signed)
Spector abdominal cramps and bowel habit changes are related to this. Have prescribed MiraLax daily with intermittent Dulcolax to see if this improves and hopefully resolve things. She should not use the diphenoxylate and atropine again.

## 2011-06-22 NOTE — Assessment & Plan Note (Addendum)
F/u MRI of abdomen will be ordered. I did explain to the patient and her husband due to her comorbidities which are now increasing with the strokes that her operative candidacy is only diminishing. At some point it may not make sense to continue to follow this and we could be nearing that though have decided to repeat an MR of the abdomen at this time.

## 2011-06-22 NOTE — Patient Instructions (Addendum)
We will contact you in the AM with your Radiology appointment for your MRI of the Abdomin.  Start Miralax and Doculax as discussed on medication list.

## 2011-06-23 ENCOUNTER — Encounter: Payer: Medicare Other | Admitting: Cardiovascular Disease

## 2011-06-23 ENCOUNTER — Telehealth: Payer: Self-pay | Admitting: Gastroenterology

## 2011-06-23 NOTE — Telephone Encounter (Signed)
Patient informed that her MRI  Of Abdomin without contrast ordered at 06/22/11 office visit was scheduled at [facility] 06/27/11 at 3:00 arrive at 2:45. Patient stated she may have to call and change date and time. Number provided to the patient to call Radiology.

## 2011-06-27 ENCOUNTER — Other Ambulatory Visit (HOSPITAL_COMMUNITY): Payer: Medicare Other

## 2011-06-29 ENCOUNTER — Encounter: Payer: Medicare Other | Admitting: Emergency Medicine

## 2011-07-04 ENCOUNTER — Ambulatory Visit (HOSPITAL_COMMUNITY)
Admission: RE | Admit: 2011-07-04 | Discharge: 2011-07-04 | Disposition: A | Discharge status: Home / Self Care | Payer: Medicare Other | Source: Ambulatory Visit | Attending: Internal Medicine | Admitting: Internal Medicine

## 2011-07-04 DIAGNOSIS — Z9089 Acquired absence of other organs: Secondary | ICD-10-CM | POA: Insufficient documentation

## 2011-07-04 DIAGNOSIS — I517 Cardiomegaly: Secondary | ICD-10-CM | POA: Insufficient documentation

## 2011-07-04 DIAGNOSIS — Z940 Kidney transplant status: Secondary | ICD-10-CM | POA: Insufficient documentation

## 2011-07-04 DIAGNOSIS — K869 Disease of pancreas, unspecified: Secondary | ICD-10-CM | POA: Insufficient documentation

## 2011-07-04 DIAGNOSIS — N269 Renal sclerosis, unspecified: Secondary | ICD-10-CM | POA: Insufficient documentation

## 2011-07-04 DIAGNOSIS — K5710 Diverticulosis of small intestine without perforation or abscess without bleeding: Secondary | ICD-10-CM | POA: Insufficient documentation

## 2011-07-04 DIAGNOSIS — D136 Benign neoplasm of pancreas: Secondary | ICD-10-CM

## 2011-07-04 DIAGNOSIS — E119 Type 2 diabetes mellitus without complications: Secondary | ICD-10-CM | POA: Insufficient documentation

## 2011-07-05 ENCOUNTER — Encounter: Payer: Self-pay | Admitting: Internal Medicine

## 2011-07-05 NOTE — Progress Notes (Signed)
Quick Note:  Have her get a copy of the disc to send to Dr. Jacquenette Shone also - you can make a note on letter that we are sending disc also ______

## 2011-07-05 NOTE — Progress Notes (Signed)
Quick Note:  Let her know the pancreatic tumor appears unchanged in size - good news.  They have made some comments about transplanted kidney (nothing bad) also - will send to Dr. Arrie Aran to review also (please cc).  I have also prepared a letter which we can send with the report to Dr. Jacquenette Shone at [facility] to review and when we hear back will recontact her with plans  If she does not hear from [place] in a month she should remind [place] about it ______

## 2011-07-06 ENCOUNTER — Ambulatory Visit (INDEPENDENT_AMBULATORY_CARE_PROVIDER_SITE_OTHER): Payer: Medicare Other | Admitting: Emergency Medicine

## 2011-07-06 DIAGNOSIS — I639 Cerebral infarction, unspecified: Secondary | ICD-10-CM

## 2011-07-06 DIAGNOSIS — I82629 Acute embolism and thrombosis of deep veins of unspecified upper extremity: Secondary | ICD-10-CM

## 2011-07-06 DIAGNOSIS — I6350 Cerebral infarction due to unspecified occlusion or stenosis of unspecified cerebral artery: Secondary | ICD-10-CM

## 2011-07-06 DIAGNOSIS — Z7901 Long term (current) use of anticoagulants: Secondary | ICD-10-CM

## 2011-07-06 DIAGNOSIS — I4891 Unspecified atrial fibrillation: Secondary | ICD-10-CM

## 2011-07-06 LAB — POCT INR: INR: 2.2

## 2011-07-12 ENCOUNTER — Encounter: Payer: Self-pay | Admitting: Internal Medicine

## 2011-07-12 ENCOUNTER — Telehealth: Payer: Self-pay | Admitting: Internal Medicine

## 2011-07-12 NOTE — Telephone Encounter (Signed)
Dr. Jacquenette Shone from the [facility] surgery department called. This was yesterday. He has reviewed her MR of the pancreas and agrees with me that routine repeat imaging followup is not necessary for this lesion that has been very slow growing, and shows minimal or no growth in the last year. This goes against malignancy. The patient's comorbidities would preclude operative intervention it seems.  Will have nursing staff called the patient and communicate this to her. She can see me as needed.

## 2011-07-19 NOTE — Telephone Encounter (Signed)
Patient advised.  She will call back as needed

## 2011-07-23 ENCOUNTER — Encounter: Payer: Medicare Other | Admitting: Cardiovascular Disease

## 2011-08-03 ENCOUNTER — Ambulatory Visit (INDEPENDENT_AMBULATORY_CARE_PROVIDER_SITE_OTHER): Payer: Medicare Other | Admitting: Emergency Medicine

## 2011-08-03 ENCOUNTER — Encounter: Payer: Medicare Other | Admitting: Emergency Medicine

## 2011-08-03 DIAGNOSIS — I82629 Acute embolism and thrombosis of deep veins of unspecified upper extremity: Secondary | ICD-10-CM

## 2011-08-03 DIAGNOSIS — I6350 Cerebral infarction due to unspecified occlusion or stenosis of unspecified cerebral artery: Secondary | ICD-10-CM

## 2011-08-03 DIAGNOSIS — I639 Cerebral infarction, unspecified: Secondary | ICD-10-CM

## 2011-08-03 DIAGNOSIS — Z7901 Long term (current) use of anticoagulants: Secondary | ICD-10-CM

## 2011-08-03 DIAGNOSIS — I4891 Unspecified atrial fibrillation: Secondary | ICD-10-CM

## 2011-08-03 LAB — POCT INR: INR: 2

## 2011-08-23 ENCOUNTER — Encounter: Payer: Self-pay | Admitting: Cardiovascular Disease

## 2011-08-31 ENCOUNTER — Ambulatory Visit (INDEPENDENT_AMBULATORY_CARE_PROVIDER_SITE_OTHER): Payer: Medicare Other | Admitting: Emergency Medicine

## 2011-08-31 DIAGNOSIS — I82629 Acute embolism and thrombosis of deep veins of unspecified upper extremity: Secondary | ICD-10-CM

## 2011-08-31 DIAGNOSIS — I6350 Cerebral infarction due to unspecified occlusion or stenosis of unspecified cerebral artery: Secondary | ICD-10-CM

## 2011-08-31 DIAGNOSIS — Z7901 Long term (current) use of anticoagulants: Secondary | ICD-10-CM

## 2011-08-31 DIAGNOSIS — I4891 Unspecified atrial fibrillation: Secondary | ICD-10-CM

## 2011-08-31 DIAGNOSIS — I639 Cerebral infarction, unspecified: Secondary | ICD-10-CM

## 2011-09-09 ENCOUNTER — Telehealth: Payer: Self-pay | Admitting: Internal Medicine

## 2011-09-09 NOTE — Telephone Encounter (Signed)
Patient reports that once a day Miralax is not effective.  I have increased her Miralax to 2-3 times a day.

## 2011-09-14 ENCOUNTER — Telehealth: Payer: Self-pay | Admitting: Internal Medicine

## 2011-09-14 NOTE — Telephone Encounter (Signed)
Informed pt of Dr Marvell Fuller orders for 2 Dulcolax and 4-6 glasses of Miralax over 2 hours. If that works, use Miralax 1-2 x daily to have better BM's. If this doesn't work call back. Went over these instructions twice and pt stated understanding.

## 2011-09-14 NOTE — Telephone Encounter (Signed)
Take 2 dulcolax and drink 4- 6 glasses of MiraLax over the next 2 hours If that works then use MiraLax 1-2 daily to have better bowel movements If it does not work then call back

## 2011-09-14 NOTE — Telephone Encounter (Signed)
Pt with hx of renal transplant, pancreatic lesion seen by Dr Jacquenette Shone, IBS, CVA'S. Last OV 06/22/11 for abdominal cramps with urges to defecate, but not emptying. Dr Arrie Aran had ordered Lomotil for her cramps? Pt instructed to use Miralax daily and Dulcolax intermittently for her constipation and stop the Lomotil. MRI of abdomen was ordered to check the pancreatic tumor; it was unchanged and slow growing per Dr Jacquenette Shone. On 09/09/11, pt called c/o constipation and was told to increase Miralax to 2-3 x daily. Today she  Called back to report the increase in Miralax has not helped and she's unable to sleep. She reports taking the Miralax as ordered along with stool softeners and Dulcolax prn. She states she cannot have a "complete" BM, only little balls every now and then. Please advise. Thanks.

## 2011-09-22 ENCOUNTER — Telehealth: Payer: Self-pay | Admitting: Internal Medicine

## 2011-09-22 NOTE — Telephone Encounter (Signed)
Patient is having recurring constipation.  She did try the Miralax purge with dulcolax with no results.  She is having pain and would like to be seen soon.  She is scheduled to see Willette Cluster RNP 09/23/11 9:30.  Patient advised if pain worsens or develops new symptoms to go to ER today or tonight for eval.  The patient verbalized understanding.

## 2011-09-23 ENCOUNTER — Ambulatory Visit (INDEPENDENT_AMBULATORY_CARE_PROVIDER_SITE_OTHER): Payer: Medicare Other | Admitting: Nurse Practitioner

## 2011-09-23 ENCOUNTER — Encounter: Payer: Self-pay | Admitting: Cardiovascular Disease

## 2011-09-23 ENCOUNTER — Encounter: Payer: Self-pay | Admitting: Nurse Practitioner

## 2011-09-23 ENCOUNTER — Telehealth: Payer: Self-pay | Admitting: *Deleted

## 2011-09-23 VITALS — BP 118/62 | HR 44 | Ht 63.0 in | Wt 158.0 lb

## 2011-09-23 DIAGNOSIS — K5900 Constipation, unspecified: Secondary | ICD-10-CM

## 2011-09-23 DIAGNOSIS — K5909 Other constipation: Secondary | ICD-10-CM

## 2011-09-23 NOTE — Telephone Encounter (Signed)
Dr. Hadley Pen nurse, Corrie Dandy called me.  She said Dr. Arrie Aran said he checked out the medication Linzess and it doesn't look like there is any drug interaction with this medication.  He said he suggests the patient tries the medication.  I called the patient to advise.  I told her to call me next week and let us know if it helps.  I told her to take 1 tab daily.

## 2011-09-23 NOTE — Progress Notes (Signed)
.  Elizabeth [name] [id number] 01-21-1941   HISTORY OR PRESENT ILLNESS : Patient is a 71 year old female with multiple medical problems including history of renal transplantation.           Dr. Leone Payor has followed patient for GERD, erosive gastritis,  IBS. 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 Her last colonoscopy was June 2010 and revealed moderated sigmoid diverticulosis and a 7mm adenomatous polyp. Patient is here with her husband for evaluation of abdominal pain and nausea. She has difficulty titrating Miralax as 1/2 capful daily is not effective and 1 capful daily causes loose stools  She complains of mid abdominal pain relieved with with bowel movements.   Current Medications, Allergies, Past Medical History, Past Surgical History, Family History and Social History were reviewed in [employer] record.   PHYSICAL EXAMINATION : General: White female in wheelchair in no acute distress Head: Normocephalic and atraumatic Eyes:  sclerae anicteric,conjunctive pink. Ears: Normal auditory acuity Neck: Supple, no masses.  Lungs: Clear throughout to auscultation Heart: Regular rate and rhythm Abdomen: Soft, nondistended, nontender. No masses or hepatomegaly noted. Normal bowel sounds Rectal: no stool in vault Musculoskeletal: left lower extremity prosthetic leg. RLE in brace. Skin: No lesions on visible extremities Extremities: No edema or deformities noted Neurological: Oriented x 4, grossly nonfocal Cervical Nodes:  No significant cervical adenopathy Psychological:  Alert and cooperative. Normal mood and affect  ASSESSMENT AND PLAN :  60. 71 year old female with acute on chronic constipation associated with mid abdominal pain relieved with defecation. She has difficulty titrating Miralax. Patient may be a candidate for Linzess which is used to treat adults with irritable bowel syndrome with constipation (IBS-C) and chronic idiopathic constipation (CIC).  While there is little systemic exposure I did check with patient's nephrologist to be on safe side and he is okay with trying the medication. Samples given. Patient to call in a few days with condition update. She knows to hold the medication in event of diarrhea. 2. 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 MRI done 07/05/11 shows lesion to be stable in size. Follow up per Dr. Christella Hartigan. 3. Multiple medical problems as listed in PMH 4. Chronic Coumadin.

## 2011-09-23 NOTE — Patient Instructions (Signed)
We have given you samples of Linzess.  Do not take it until we hear from Dr. Hadley Pen nurse or you talk to him on Monday. We will call you today if we hear from that office  Before 5 PM.

## 2011-09-25 ENCOUNTER — Encounter: Payer: Self-pay | Admitting: Nurse Practitioner

## 2011-09-26 ENCOUNTER — Encounter: Payer: Self-pay | Admitting: Nurse Practitioner

## 2011-09-26 NOTE — Telephone Encounter (Signed)
Great, will see if it helps her.

## 2011-09-26 NOTE — Progress Notes (Signed)
Reviewed and agree with management. Lev Cervone D. [place], M.D., FACG  

## 2011-09-29 ENCOUNTER — Telehealth: Payer: Self-pay | Admitting: *Deleted

## 2011-09-29 NOTE — Telephone Encounter (Signed)
Called and left a message asking her if the Linzess tablets were helping to move her bowels.

## 2011-09-29 NOTE — Telephone Encounter (Signed)
The patient called back and said she does not notice much difference with the Linzess tablets.  She has only moved her bowels a few times, not much volume at all.  She tired to use an enema and only had a small amount of liquid come out.  Called Willette Cluster ACNP and let her know that Nathaniel hasn't had much success at all with the Linzess.  Gunnar Fusi wants me to have her take 3 doses of Miralax daily for 2-3 days and call us back. Gunnar Fusi would like her to follow up with Dr Stan Head.  Called patient back and gave her the directions for taking 3 doses of Miralax for the next 2-3 days and call us to let us know if she has had any results.  If she has Gunnar Fusi would like the pt to give Linzess another try 1 tablet daily.  I made her an appointment with Dr. Stan Head on 10-11-2011 at 11:00 AM.

## 2011-10-07 ENCOUNTER — Telehealth: Payer: Self-pay | Admitting: *Deleted

## 2011-10-07 NOTE — Telephone Encounter (Signed)
Called the patient and asked her how she was doing.  She said she has had some watery loose stools but the linzess isn't helping.  She is also taking 1 dose of Miralax too daily.  I reminded her of her appointment on Tues 10-11-2011 with Dr. Leone Payor.  I asked her if she had urgency with the watery stools and she said sometimes. I told her to back off the Miralax if she is having watery stools.  I told her if she has to go out someplace and is worried about the urgency she could take 1 Imodium and to not take more than 4 in a day.  I did remind her the Linzess is for chronic constipation the dose she is taking, the lowest dose.  I told her to document how many times in a day she is having loose stools and she can give this information to Dr. Leone Payor when she sees him.

## 2011-10-11 ENCOUNTER — Ambulatory Visit (INDEPENDENT_AMBULATORY_CARE_PROVIDER_SITE_OTHER): Payer: Medicare Other | Admitting: Internal Medicine

## 2011-10-11 ENCOUNTER — Encounter: Payer: Self-pay | Admitting: Internal Medicine

## 2011-10-11 DIAGNOSIS — R159 Full incontinence of feces: Secondary | ICD-10-CM | POA: Insufficient documentation

## 2011-10-11 DIAGNOSIS — K5900 Constipation, unspecified: Secondary | ICD-10-CM | POA: Insufficient documentation

## 2011-10-11 MED ORDER — POLYETHYLENE GLYCOL 3350 17 G PO PACK: 8.5000 g | PACK | Freq: Every day | ORAL | Status: DC | PRN

## 2011-10-11 NOTE — Progress Notes (Signed)
Subjective:    Patient ID: Elizabeth [name], female    DOB: 1940/12/29, 71 y.o.   MRN: [id number]  HPI This is a pleasant 71 year old married white woman here for followup of constipation problems. She has a complicated medical history that includes a suspected mucinous tumor of the pancreas, prior kidney transplant, lower extremity agitation and stroke. The stroke occurred in the spring of 2012. Since that time she was less mobile. She developed increasing problems with constipation which worsened in the fall. She was seen by our nurse practitioner about 2-3 weeks ago for worsening constipation. Rectal exam showed no stool. She was actually having some diarrhea problems when she was on MiraLax daily, getting some nausea and cramps and loose stools. She said for the most part she always felt like she needed to defecate. However she couldn't tell whether she was going to pass flatus or leak stool and she would have loose runny stools with incontinence at times. She was tried on Linzess after seeing her nurse practitioner earlier this month. It didn't seem to have much effect. The MiraLax purge was advised and she stayed on Wednesdays after February 9. She then developed some problems with diarrhea. The MiraLax purge did seem to produce stool though she never felt completely empty. She didn't tolerate that okay. Her symptoms are accompanied by some intermittent abdominal pain and cramps. They have disturb her sleep at times. There is no clear pattern. There is associated nausea.  She is not using narcotic pain medications though on her list, only using Tylenol. She has not been using Imodium. She is tearful discussing her problems. She says she cannot know for sure what her bowel habits will be like and is impaired her willingness inability to leave the house. No Known Allergies Outpatient Prescriptions Prior to Visit  Medication Sig Dispense Refill  . acetaminophen (TYLENOL) 650 MG CR tablet Take 650 mg  by mouth every 8 (eight) hours as needed.        Marland Kitchen atorvastatin (LIPITOR) 10 MG tablet Take 10 mg by mouth every other day.        . bisacodyl (DULCOLAX) 5 MG EC tablet 1 by mouth every 2-3 days as needed to promote bowel movement  30 tablet  0  . Calcium Carbonate-Vitamin D (CALCIUM 600-D) 600-400 MG-UNIT per tablet Take 1 tablet by mouth 2 (two) times daily.        . fludrocortisone (FLORINEF) 0.1 MG tablet Take 0.1 mg by mouth. Every other day      . HYDROcodone-acetaminophen (NORCO) 5-325 MG per tablet Take 1 tablet by mouth every 6 (six) hours as needed.        . insulin glargine (LANTUS) 100 UNIT/ML injection Inject 13 Units into the skin every morning.       . insulin lispro (HUMALOG) 100 UNIT/ML injection Inject 12 Units into the skin as directed.       Marland Kitchen levothyroxine (SYNTHROID, LEVOTHROID) 175 MCG tablet Take 175 mcg by mouth daily.      . Multiple Vitamin (TAB-A-VITE PO) Take 0.5 tablets by mouth daily.        . mycophenolate (MYFORTIC) 360 MG TBEC Take 360 mg by mouth 2 (two) times daily.        Marland Kitchen omeprazole (PRILOSEC) 20 MG capsule Take 20 mg by mouth daily.        . Oxybutynin Chloride (GELNIQUE) 10 % GEL Place 1 application onto the skin daily.        . predniSONE (  DELTASONE) 5 MG tablet Take 5 mg by mouth daily.        . tacrolimus (PROGRAF) 0.5 MG capsule Take 0.5 mg by mouth as directed. 4 capsules every morning and 1mg  every evening       . warfarin (COUMADIN) 2.5 MG tablet Take as directed by the coumadin clinic.  180 tablet  3  . polyethylene glycol (MIRALAX / GLYCOLAX) packet Take 17 g by mouth as needed.       Past Medical History  Diagnosis Date  . Coronary artery disease   . Chronic kidney disease   . Diabetes mellitus   . PVD (peripheral vascular disease)   . History of orthostatic hypotension   . Hyperlipidemia   . Hypertension   . Hypothyroidism   . Anemia   . Pancreatic cyst   . Osteoporosis   . Melanoma   . Carotid bruit     left  . Adenomatous polyp     . GERD (gastroesophageal reflux disease)   . Gastritis   . CVA (cerebral infarction)    Past Surgical History  Procedure Date  . Kidney transplant   . Below knee leg amputation     left  . Appendectomy   . Cataract extraction   . Cholecystectomy   . Abdominal hysterectomy   . Thyroid surgery   . Tonsillectomy   . Cesarean section     x 2  . Vitrectomy     right eye  . Coronary artery bypass graft   . Colonoscopy w/ biopsies and polypectomy 02/18/2009    adenomatous polyps, diverticulosis, internal hemorrhoids  . Eus 02/04/2010    w/FNA, pancreatic cyst  . Upper gastrointestinal endoscopy 12/01/2009   History   Social History  . Marital Status: Married    Spouse Name: N/A    Number of Children: 2  .     Occupational History  . retired Engineer, civil (consulting)    Social History Main Topics  . Smoking status: Never Smoker   . Smokeless tobacco: Never Used  . Alcohol Use: No  . Drug Use: No          Review of Systems Uses a wheelchair and has to have a gait belt and help from her husband during ambulation and transfer.    Objective:   Physical Exam Chronically ill elderly white woman. Abdomen is soft, bowel sounds are present. She is tender in the right lower quadrant. This is mild and there is some fullness there but no mass. Rectal exam is performed with female staff present. Resting tone is slightly decreased. There are multiple pellet-like brown stool balls in the rectum. There is no real impaction.       Assessment & Plan:

## 2011-10-11 NOTE — Assessment & Plan Note (Signed)
This is based on history mainly. It certainly sounds like that's what her problem has been. With the background of IBS it's not entirely certain but I take this is the case. We'll treat as if so.

## 2011-10-11 NOTE — Patient Instructions (Signed)
Take 1/2 dose of Miralax daily and take Dulcolax or Senna every 2-3 days as needed for constipation. Please give [place] a call back in 2-3 weeks with an update on your condition.

## 2011-10-11 NOTE — Assessment & Plan Note (Addendum)
I believe this is related to her change in mobility. She does have some underlying IBS as well but I don't think that the predominant issue. Plan to retry MiraLax at half dose a day with intermittent stimulant laxative to see if that will provide adequate emptying of the bowel and rectum. Hopefully that would avoid the overflow incontinence problems. If she has persistent issues, imaging may be needed with KUB, as I suppose she could still have some sort of high impaction. She did not necessarily fail Linzess but as in many of these situations it is difficult to sort out cause and effect of medications

## 2011-10-12 ENCOUNTER — Ambulatory Visit (INDEPENDENT_AMBULATORY_CARE_PROVIDER_SITE_OTHER): Payer: Medicare Other | Admitting: *Deleted

## 2011-10-12 DIAGNOSIS — I82629 Acute embolism and thrombosis of deep veins of unspecified upper extremity: Secondary | ICD-10-CM

## 2011-10-12 DIAGNOSIS — I4891 Unspecified atrial fibrillation: Secondary | ICD-10-CM

## 2011-10-12 DIAGNOSIS — I639 Cerebral infarction, unspecified: Secondary | ICD-10-CM

## 2011-10-12 DIAGNOSIS — Z7901 Long term (current) use of anticoagulants: Secondary | ICD-10-CM

## 2011-10-12 DIAGNOSIS — I6350 Cerebral infarction due to unspecified occlusion or stenosis of unspecified cerebral artery: Secondary | ICD-10-CM

## 2011-10-26 ENCOUNTER — Ambulatory Visit (INDEPENDENT_AMBULATORY_CARE_PROVIDER_SITE_OTHER): Payer: Medicare Other

## 2011-10-26 DIAGNOSIS — I6350 Cerebral infarction due to unspecified occlusion or stenosis of unspecified cerebral artery: Secondary | ICD-10-CM

## 2011-10-26 DIAGNOSIS — I639 Cerebral infarction, unspecified: Secondary | ICD-10-CM

## 2011-10-26 DIAGNOSIS — I4891 Unspecified atrial fibrillation: Secondary | ICD-10-CM

## 2011-10-26 DIAGNOSIS — I82629 Acute embolism and thrombosis of deep veins of unspecified upper extremity: Secondary | ICD-10-CM

## 2011-10-26 DIAGNOSIS — Z7901 Long term (current) use of anticoagulants: Secondary | ICD-10-CM

## 2011-10-26 LAB — POCT INR: INR: 1.7

## 2011-10-28 ENCOUNTER — Telehealth: Payer: Self-pay | Admitting: Internal Medicine

## 2011-10-28 NOTE — Telephone Encounter (Signed)
Left message for patient to call back  

## 2011-10-28 NOTE — Telephone Encounter (Signed)
Patient called to report that she is not any better.  Her symptoms are the same.  She has a log but her husband wrote it and she can't read it.  She states she is "miserable".  Dr Leone Payor please advise

## 2011-10-31 NOTE — Telephone Encounter (Signed)
Have her get a 2 view abdominal film re: constipation Once I see that will decide next step

## 2011-10-31 NOTE — Telephone Encounter (Signed)
Left message for patient to call back  

## 2011-11-09 ENCOUNTER — Ambulatory Visit (INDEPENDENT_AMBULATORY_CARE_PROVIDER_SITE_OTHER): Payer: Medicare Other

## 2011-11-09 DIAGNOSIS — I6350 Cerebral infarction due to unspecified occlusion or stenosis of unspecified cerebral artery: Secondary | ICD-10-CM

## 2011-11-09 DIAGNOSIS — Z7901 Long term (current) use of anticoagulants: Secondary | ICD-10-CM

## 2011-11-09 DIAGNOSIS — I4891 Unspecified atrial fibrillation: Secondary | ICD-10-CM

## 2011-11-09 DIAGNOSIS — I82629 Acute embolism and thrombosis of deep veins of unspecified upper extremity: Secondary | ICD-10-CM

## 2011-11-09 DIAGNOSIS — I639 Cerebral infarction, unspecified: Secondary | ICD-10-CM

## 2011-11-09 LAB — POCT INR: INR: 2.7

## 2011-11-10 NOTE — Telephone Encounter (Signed)
Patient will come for x-rays today or tomorrow

## 2011-11-11 ENCOUNTER — Ambulatory Visit (INDEPENDENT_AMBULATORY_CARE_PROVIDER_SITE_OTHER)
Admission: RE | Admit: 2011-11-11 | Discharge: 2011-11-11 | Disposition: A | Discharge status: Home / Self Care | Payer: Medicare Other | Source: Ambulatory Visit | Attending: Internal Medicine | Admitting: Internal Medicine

## 2011-11-11 ENCOUNTER — Other Ambulatory Visit: Payer: Self-pay

## 2011-11-11 DIAGNOSIS — K5900 Constipation, unspecified: Secondary | ICD-10-CM

## 2011-11-11 MED ORDER — PEG 3350-KCL-NABCB-NACL-NASULF 236 G PO SOLR: 4.0000 L | Freq: Once | ORAL | Status: DC

## 2011-11-11 NOTE — Progress Notes (Signed)
Quick Note:  Results discussed with patient Symptom log for past month reviewed - complicated and difficult to interpret  Main issues remain that she has small ineffective stools - had diarrhea with urge incontinence problems on MiraLax and stopped it Still has nausea and RLQ pain/cramps  She needs to retry colon purge, says MiraLax purge in Feb did not do too much  We will prescribe Nulytely and she needs to take 2 dulcolax tabs and an hour later start Nulytely and drink 4 L over about 4 hours - do this over the weekend Sat or Sunday and call us back Monday with results (did she empty out and have watery stools).  Once I know results of this purge will decide next treatment plan  ______

## 2011-11-14 ENCOUNTER — Ambulatory Visit: Payer: Medicare Other | Admitting: Cardiovascular Disease

## 2011-11-14 ENCOUNTER — Telehealth: Payer: Self-pay | Admitting: Internal Medicine

## 2011-11-14 NOTE — Telephone Encounter (Signed)
Patient advised.

## 2011-11-14 NOTE — Telephone Encounter (Signed)
OK - lets see how she does on this regimen

## 2011-11-14 NOTE — Telephone Encounter (Signed)
Patient was able to clean out with Golyte, dulcolax, and some extra miralax.  She is taking daily Miralax and Metamucil.  I have advised that she needs to make sure she is drinking plenty of fluid with metamucil,

## 2011-11-16 ENCOUNTER — Ambulatory Visit: Payer: Medicare Other | Admitting: Cardiovascular Disease

## 2011-11-21 ENCOUNTER — Encounter: Payer: Self-pay | Admitting: Cardiovascular Disease

## 2011-11-21 ENCOUNTER — Ambulatory Visit (INDEPENDENT_AMBULATORY_CARE_PROVIDER_SITE_OTHER): Payer: Medicare Other | Admitting: Cardiovascular Disease

## 2011-11-21 ENCOUNTER — Telehealth: Payer: Self-pay | Admitting: Internal Medicine

## 2011-11-21 VITALS — BP 90/60 | HR 50 | Ht 62.0 in | Wt 162.0 lb

## 2011-11-21 DIAGNOSIS — I10 Essential (primary) hypertension: Secondary | ICD-10-CM

## 2011-11-21 DIAGNOSIS — E785 Hyperlipidemia, unspecified: Secondary | ICD-10-CM

## 2011-11-21 DIAGNOSIS — I4891 Unspecified atrial fibrillation: Secondary | ICD-10-CM

## 2011-11-21 DIAGNOSIS — K5900 Constipation, unspecified: Secondary | ICD-10-CM

## 2011-11-21 DIAGNOSIS — I2510 Atherosclerotic heart disease of native coronary artery without angina pectoris: Secondary | ICD-10-CM

## 2011-11-21 DIAGNOSIS — I639 Cerebral infarction, unspecified: Secondary | ICD-10-CM

## 2011-11-21 DIAGNOSIS — I6350 Cerebral infarction due to unspecified occlusion or stenosis of unspecified cerebral artery: Secondary | ICD-10-CM

## 2011-11-21 MED ORDER — LACTULOSE 20 GM/30ML PO SOLN: 30.0000 mL | Freq: Two times a day (BID) | ORAL | Status: DC

## 2011-11-21 MED ORDER — FLUDROCORTISONE ACETATE 0.1 MG PO TABS: 0.1000 mg | ORAL_TABLET | Freq: Every day | ORAL | Status: DC

## 2011-11-21 NOTE — Telephone Encounter (Signed)
lmom for pt to call back. Pt saw Dr Mariah Milling today and he placed her on Lactulose.

## 2011-11-21 NOTE — Assessment & Plan Note (Signed)
We have suggested that she increase her MiraLAX, and Citrucel. If she continues to have problems, she could try lactulose.

## 2011-11-21 NOTE — Assessment & Plan Note (Signed)
Currently with no symptoms of angina. No further workup at this time. Continue current medication regimen. 

## 2011-11-21 NOTE — Patient Instructions (Addendum)
You are doing well. Please start lactulose, citracel if needed for constipation  Please call us if you have new issues that need to be addressed before your next appt.  Your physician wants you to follow-up in: 6 months.  You will receive a reminder letter in the mail two months in advance. If you don't receive a letter, please call our office to schedule the follow-up appointment.

## 2011-11-21 NOTE — Assessment & Plan Note (Signed)
Continues to recover  from her stroke. currently on warfarin. We have encouraged her to continue with her physical therapy and exercise

## 2011-11-21 NOTE — Assessment & Plan Note (Signed)
Cholesterol is at goal on the current lipid regimen. No changes to the medications were made.  

## 2011-11-21 NOTE — Assessment & Plan Note (Signed)
Continued hypotension though with no significant dizziness. We will continue Florinef 0.1 mg every other day. She can increase the dose if needed for dizziness.

## 2011-11-21 NOTE — Telephone Encounter (Signed)
Pt with hx of constipation and stool incontinence. She saw Dr Leone Payor on 10/11/11 an was started on Linzess 145mg  and reported she didn't get effective results. She was given a purge of golytely with great results, but the same problems keep happening again. She has tried Miralax with Metamucil and they combination is at times too strong. Pt saw her cardiologist today, Dr Mariah Milling and he ordered Lactulose. Husband asked if pt had a blockage is it safe to do another purge? Pt did not have an impaction per 10/11/11 notes. Pt reports the main concern other than constipation is pain at night. She reports when she lies down she gets discomfort in the middle of her stomach and on the upper right side above her navel. The pain is constant and she gets no sleep. Please advise. Thanks.

## 2011-11-21 NOTE — Progress Notes (Signed)
Patient ID: Elizabeth [name], female    DOB: 1940-08-28, 71 y.o.   MRN: [id number]  HPI Comments: Elizabeth [name]  is a 71 year old woman with a history of CVA (on warfarin), end-stage renal disease secondary to diabetic nephropathy, s/p  kidney transplant in December 2001 with normal creatinine,  retinopathy, neuropathy, and nephropathy, CAD, status post 1-vessel bypass of her circumflex in 2001, chronic orthostatic hypotension, peripheral vascular disease status post left BKA in the setting of a Charcot joint,  admitted to [facility] in October 2011 with severe PNA,  peak Trop 0.19, Atrial fibrillation during her hospital course, started on amiodarone, With discontinuation of her amiodarone secondary to bradycardia and orthostatic hypotension. Florinef was started earlier in 2012 with improvement of her symptoms and the ability to work with physical therapy.   history of aortic atherosclerosis, hyperlipidemia, labile blood pressures  She has vision problems, continues to have balance problems, and was told that she has vascular dementia.  She has graduated from physical therapy. She is taking Florinef 0.1 mg every other day with improvement of her dizziness. She is walking more and has started a program called [facility].  She has had severe problems with constipation. She has tried MiraLAX and other over-the-counter medications with no relief of her symptoms. Recent abdominal x-ray showed significant amount of stool. Nausea and vomiting has improved   Echo showed EF 50-55% with mild AI.     Carotid u/s 11/09. normal.     EKG shows normal Bradycardia with rate 50 beats per minute, Nonspecific T wave abnormality in one and aVL   Outpatient Encounter Prescriptions as of 11/21/2011  Medication Sig Dispense Refill  . atorvastatin (LIPITOR) 10 MG tablet Take 10 mg by mouth every other day.        . bisacodyl (DULCOLAX) 5 MG EC tablet 1 by mouth every 2-3 days as needed to promote bowel movement  30  tablet  0  . Calcium Carbonate-Vitamin D (CALCIUM 600-D) 600-400 MG-UNIT per tablet Take 1 tablet by mouth 2 (two) times daily.        . fludrocortisone (FLORINEF) 0.1 MG tablet Take 1 tablet (0.1 mg total) by mouth daily.  90 tablet  4  . HYDROcodone-acetaminophen (NORCO) 5-325 MG per tablet Take 1 tablet by mouth every 6 (six) hours as needed.        . insulin glargine (LANTUS) 100 UNIT/ML injection Inject 14 Units into the skin every morning.       . insulin lispro (HUMALOG) 100 UNIT/ML injection Inject 12 Units into the skin as directed.       Marland Kitchen levothyroxine (SYNTHROID, LEVOTHROID) 175 MCG tablet Take 175 mcg by mouth daily.      . Multiple Vitamin (TAB-A-VITE PO) Take 0.5 tablets by mouth daily.        . mycophenolate (MYFORTIC) 360 MG TBEC Take 360 mg by mouth 2 (two) times daily.        . Oxybutynin Chloride (GELNIQUE) 10 % GEL Place 1 application onto the skin daily.        . polyethylene glycol (GOLYTELY) 236 G solution Take 4,000 mLs by mouth once. Drink 1 gallon over 4 hours  1 mL  0  . polyethylene glycol (MIRALAX / GLYCOLAX) packet Take 8.5 g by mouth daily as needed.  14 each  0  . predniSONE (DELTASONE) 5 MG tablet Take 5 mg by mouth daily.        . tacrolimus (PROGRAF) 0.5 MG capsule Take 0.5 mg  by mouth as directed. 4 capsules every morning and 1mg  every evening       . warfarin (COUMADIN) 2.5 MG tablet Take as directed by the coumadin clinic.  180 tablet  3    Review of Systems  HENT: Negative.   Eyes: Negative.   Respiratory: Negative.   Gastrointestinal: Positive for nausea and constipation.  Musculoskeletal: Positive for gait problem.  Skin: Negative.   Neurological: Positive for weakness.  Hematological: Negative.   Psychiatric/Behavioral: Negative.   All other systems reviewed and are negative.    BP 90/60  Pulse 50  Ht 5\' 2"  (1.575 m)  Wt 162 lb (73.483 kg)  BMI 29.63 kg/m2   Physical Exam  Nursing note and vitals reviewed. Constitutional: She is  oriented to person, place, and time. She appears well-developed and well-nourished.        Sitting in a wheelchair, right leg has a wrap in place. 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   HENT:  Head: Normocephalic.  Nose: Nose normal.  Mouth/Throat: Oropharynx is clear and moist.  Eyes: Conjunctivae are normal. Pupils are equal, round, and reactive to light.  Neck: Normal range of motion. Neck supple. No JVD present.  Cardiovascular: Normal rate, regular rhythm, S1 normal, S2 normal, normal heart sounds and intact distal pulses.  Exam reveals no gallop and no friction rub.   No murmur heard. Pulmonary/Chest: Effort normal and breath sounds normal. No respiratory distress. She has no wheezes. She has no rales. She exhibits no tenderness.  Abdominal: Soft. Bowel sounds are normal. She exhibits no distension. There is no tenderness.  Musculoskeletal: Normal range of motion. She exhibits no edema and no tenderness.  Lymphadenopathy:    She has no cervical adenopathy.  Neurological: She is alert and oriented to person, place, and time. Coordination normal.  Skin: Skin is warm and dry. No rash noted. No erythema.  Psychiatric: She has a normal mood and affect. Her behavior is normal. Judgment and thought content normal.         Assessment and Plan

## 2011-11-21 NOTE — Assessment & Plan Note (Signed)
Maintaining normal sinus rhythm 

## 2011-11-22 MED ORDER — HYOSCYAMINE SULFATE 0.125 MG PO TABS: 0.1250 mg | ORAL_TABLET | ORAL | Status: DC | PRN

## 2011-11-22 MED ORDER — LACTULOSE 20 GM/30ML PO SOLN: 30.0000 mL | Freq: Two times a day (BID) | ORAL | Status: DC

## 2011-11-22 NOTE — Telephone Encounter (Signed)
Patient advised.  RX sent to her local pharmacy.

## 2011-11-22 NOTE — Telephone Encounter (Signed)
Add hyomax 0.25mg  s.l. q4h prn. D/c linzess. Begin lactulose 15-30cc bid

## 2011-11-29 ENCOUNTER — Telehealth: Payer: Self-pay | Admitting: Internal Medicine

## 2011-11-30 ENCOUNTER — Ambulatory Visit (INDEPENDENT_AMBULATORY_CARE_PROVIDER_SITE_OTHER): Payer: Medicare Other

## 2011-11-30 DIAGNOSIS — I6350 Cerebral infarction due to unspecified occlusion or stenosis of unspecified cerebral artery: Secondary | ICD-10-CM

## 2011-11-30 DIAGNOSIS — I639 Cerebral infarction, unspecified: Secondary | ICD-10-CM

## 2011-11-30 DIAGNOSIS — Z7901 Long term (current) use of anticoagulants: Secondary | ICD-10-CM

## 2011-11-30 DIAGNOSIS — I82629 Acute embolism and thrombosis of deep veins of unspecified upper extremity: Secondary | ICD-10-CM

## 2011-11-30 DIAGNOSIS — I4891 Unspecified atrial fibrillation: Secondary | ICD-10-CM

## 2011-11-30 MED ORDER — LINACLOTIDE 290 MCG PO CAPS: 290.0000 mg | ORAL_CAPSULE | Freq: Every day | ORAL | Status: DC

## 2011-11-30 NOTE — Telephone Encounter (Signed)
1) dc lactulose 2) take dulcolax 2 po x 1 today and repeat if no effect in 2-3 hours 3) try Linzess at 290 mcg daily - can provide samples or she can take 2 of the 145's if she has them left 4) f/u me 1-2 weeks 5) if she goes 3 days without defecating take dulcolax (if it works)on the third day

## 2011-11-30 NOTE — Telephone Encounter (Signed)
Patient was changed to Lactulose see phone from 11/21/11.  She reports she is taking lactulose and about 3 days she stopped having stools and has not had a stool since.  She reports she was having loose stools up until that point.  She reports pain has returned on her right side.  Dr Leone Payor please advise?

## 2011-11-30 NOTE — Telephone Encounter (Signed)
Patient aware.  REV scheduled for 12/09/11

## 2011-12-05 ENCOUNTER — Telehealth: Payer: Self-pay | Admitting: Internal Medicine

## 2011-12-05 NOTE — Telephone Encounter (Signed)
Patient advised I have scheduled her for CT scan tomorrow at 11:00

## 2011-12-05 NOTE — Telephone Encounter (Signed)
Patient took the linzess daily, over the weekend they gave her 3 dulcolax, and miralax purge.  She is having liquid (almost  stools today.  She feels that she is still full of stool and having a great deal of pain.  She would like to come in for a KUB.  Dr Leone Payor please advise

## 2011-12-05 NOTE — Telephone Encounter (Signed)
I recommend a CT abd/pelvis with oral but no IV contrast (s/p renal transplant) re: abdominal pain and constipation

## 2011-12-06 ENCOUNTER — Ambulatory Visit (INDEPENDENT_AMBULATORY_CARE_PROVIDER_SITE_OTHER)
Admission: RE | Admit: 2011-12-06 | Discharge: 2011-12-06 | Disposition: A | Discharge status: Home / Self Care | Payer: Medicare Other | Source: Ambulatory Visit | Attending: Internal Medicine | Admitting: Internal Medicine

## 2011-12-06 DIAGNOSIS — R109 Unspecified abdominal pain: Secondary | ICD-10-CM

## 2011-12-07 NOTE — Progress Notes (Signed)
Quick Note:  No obstruction, mild-moderate stool burden - will discuss at follow-up visit 4/19 ______

## 2011-12-09 ENCOUNTER — Encounter: Payer: Self-pay | Admitting: Internal Medicine

## 2011-12-09 ENCOUNTER — Ambulatory Visit (INDEPENDENT_AMBULATORY_CARE_PROVIDER_SITE_OTHER): Payer: Medicare Other | Admitting: Internal Medicine

## 2011-12-09 VITALS — BP 116/58 | HR 56 | Ht 63.5 in | Wt 162.0 lb

## 2011-12-09 DIAGNOSIS — K5900 Constipation, unspecified: Secondary | ICD-10-CM

## 2011-12-09 DIAGNOSIS — K589 Irritable bowel syndrome without diarrhea: Secondary | ICD-10-CM

## 2011-12-09 MED ORDER — BISACODYL 5 MG PO TBEC: DELAYED_RELEASE_TABLET | ORAL | Status: DC

## 2011-12-09 NOTE — Patient Instructions (Signed)
Take your Dulcolax 3 tablets by mouth every other night. Consider adding Miralax every day depending on your response to increasing your Dulcolax.

## 2011-12-09 NOTE — Progress Notes (Signed)
  Subjective:    Patient ID: Elizabeth [name], female    DOB: Nov 02, 1940, 71 y.o.   MRN: [id number]  HPI The patient returns with her husband because of recurrent bowel difficulties. She has had success with MiraLax purge regimens and is used as a couple of times in the past 2 months or so. She has tried MiraLax, Dulcolax, lactulose, as well as linaclotide. Nothing has promoted a regular defecation pattern. She continues with going for 3 or 4 days and no defecation with increasing cramps abdominal pain and then one to 2 days of multiple loose stools with some solid stool mixed in. She gets tearful when talking about this.  CT scan was performed because of complaints of abdominal pain and persistent issues and was really unrevealing.  The patient and her husband, mostly her husband do report that Dulcolax promotes defecation when she takes 3. Hyoscyamine is helping intermittent abdominal cramps. She feels better when she empties her bowels though its a rough experience to have all the stools.  She asks if her stroke could be related to this. She clearly has less mobility now.  Medications, allergies, past medical history, past surgical history, family history and social history are reviewed and updated in the EMR.  Review of Systems His tearful during interview several times, this has happened before. Husband reports that she was on an antidepressant she was in the skilled nursing facility for rehabilitation but this was stopped after about 2 weeks, he thinks it was Zoloft.    Objective:   Physical Exam Chronically although wheelchair it no acute distress intermittently tearful       Assessment & Plan:   1. IBS (irritable bowel syndrome)   2. Constipation    3. Suspect Depression  She is going to try Dulcolax 3 tablets every other night, add back MiraLax if needed. The goal was to try to promote regular defecation and avoid the cycles of no stools and then multiple stools. Increasing  physical activity may help as well. Unfortunately multiple other laxative agents have not helped. I think impaction from previous rectal exam, plain films and the CT scan has been ruled out. Note that several years ago she was having daily diarrhea problems and colonoscopy with random biopsies did not show any colitis. It is not uncommon for IBS to switch patterns, and again I think her change in functional status after the stroke may be contributing to some of these alternating IBS-like problems.  I do think she is depressed, she cries spontaneously when talking about her problems. She has a very flat affect. It is not uncommon for one to be depressed after a stroke in such a change in functional status. I have asked the husband and patient to discuss antidepressant therapy with her primary care physician, Dr. Arrie Aran. I think she could benefit from this.  Hopefully this new regimen we'll make a difference, and I will see her back as needed. They noted call after several weeks if this is not working.  [id number],VHQION A, MD

## 2011-12-19 ENCOUNTER — Other Ambulatory Visit: Payer: Self-pay | Admitting: Dermatology

## 2011-12-28 ENCOUNTER — Ambulatory Visit (INDEPENDENT_AMBULATORY_CARE_PROVIDER_SITE_OTHER): Payer: Medicare Other

## 2011-12-28 DIAGNOSIS — I82629 Acute embolism and thrombosis of deep veins of unspecified upper extremity: Secondary | ICD-10-CM

## 2011-12-28 DIAGNOSIS — Z7901 Long term (current) use of anticoagulants: Secondary | ICD-10-CM

## 2011-12-28 DIAGNOSIS — I639 Cerebral infarction, unspecified: Secondary | ICD-10-CM

## 2011-12-28 DIAGNOSIS — I6350 Cerebral infarction due to unspecified occlusion or stenosis of unspecified cerebral artery: Secondary | ICD-10-CM

## 2011-12-28 DIAGNOSIS — I4891 Unspecified atrial fibrillation: Secondary | ICD-10-CM

## 2012-01-18 ENCOUNTER — Ambulatory Visit (INDEPENDENT_AMBULATORY_CARE_PROVIDER_SITE_OTHER): Payer: Medicare Other

## 2012-01-18 DIAGNOSIS — I6350 Cerebral infarction due to unspecified occlusion or stenosis of unspecified cerebral artery: Secondary | ICD-10-CM

## 2012-01-18 DIAGNOSIS — I82629 Acute embolism and thrombosis of deep veins of unspecified upper extremity: Secondary | ICD-10-CM

## 2012-01-18 DIAGNOSIS — I4891 Unspecified atrial fibrillation: Secondary | ICD-10-CM

## 2012-01-18 DIAGNOSIS — I639 Cerebral infarction, unspecified: Secondary | ICD-10-CM

## 2012-01-18 DIAGNOSIS — Z7901 Long term (current) use of anticoagulants: Secondary | ICD-10-CM

## 2012-01-18 LAB — POCT INR: INR: 2.1

## 2012-01-27 ENCOUNTER — Encounter (HOSPITAL_COMMUNITY): Payer: Self-pay | Admitting: *Deleted

## 2012-01-27 ENCOUNTER — Inpatient Hospital Stay (HOSPITAL_COMMUNITY)
Admission: EM | Admit: 2012-01-27 | Discharge: 2012-02-01 | DRG: 392 | Disposition: A | Discharge status: Home / Self Care | Payer: Medicare Other | Source: Ambulatory Visit | Attending: Internal Medicine | Admitting: Internal Medicine

## 2012-01-27 ENCOUNTER — Emergency Department (HOSPITAL_COMMUNITY): Payer: Medicare Other

## 2012-01-27 DIAGNOSIS — K589 Irritable bowel syndrome without diarrhea: Secondary | ICD-10-CM

## 2012-01-27 DIAGNOSIS — I498 Other specified cardiac arrhythmias: Secondary | ICD-10-CM | POA: Diagnosis present

## 2012-01-27 DIAGNOSIS — R0989 Other specified symptoms and signs involving the circulatory and respiratory systems: Secondary | ICD-10-CM

## 2012-01-27 DIAGNOSIS — R933 Abnormal findings on diagnostic imaging of other parts of digestive tract: Secondary | ICD-10-CM

## 2012-01-27 DIAGNOSIS — R159 Full incontinence of feces: Secondary | ICD-10-CM

## 2012-01-27 DIAGNOSIS — I639 Cerebral infarction, unspecified: Secondary | ICD-10-CM

## 2012-01-27 DIAGNOSIS — Z951 Presence of aortocoronary bypass graft: Secondary | ICD-10-CM

## 2012-01-27 DIAGNOSIS — Z7901 Long term (current) use of anticoagulants: Secondary | ICD-10-CM

## 2012-01-27 DIAGNOSIS — R609 Edema, unspecified: Secondary | ICD-10-CM

## 2012-01-27 DIAGNOSIS — I495 Sick sinus syndrome: Secondary | ICD-10-CM

## 2012-01-27 DIAGNOSIS — E1129 Type 2 diabetes mellitus with other diabetic kidney complication: Secondary | ICD-10-CM | POA: Diagnosis present

## 2012-01-27 DIAGNOSIS — Z940 Kidney transplant status: Secondary | ICD-10-CM

## 2012-01-27 DIAGNOSIS — IMO0002 Reserved for concepts with insufficient information to code with codable children: Secondary | ICD-10-CM

## 2012-01-27 DIAGNOSIS — Z8673 Personal history of transient ischemic attack (TIA), and cerebral infarction without residual deficits: Secondary | ICD-10-CM

## 2012-01-27 DIAGNOSIS — Z823 Family history of stroke: Secondary | ICD-10-CM

## 2012-01-27 DIAGNOSIS — N058 Unspecified nephritic syndrome with other morphologic changes: Secondary | ICD-10-CM | POA: Diagnosis present

## 2012-01-27 DIAGNOSIS — R112 Nausea with vomiting, unspecified: Secondary | ICD-10-CM | POA: Diagnosis present

## 2012-01-27 DIAGNOSIS — Z23 Encounter for immunization: Secondary | ICD-10-CM

## 2012-01-27 DIAGNOSIS — K5730 Diverticulosis of large intestine without perforation or abscess without bleeding: Secondary | ICD-10-CM | POA: Diagnosis present

## 2012-01-27 DIAGNOSIS — K6389 Other specified diseases of intestine: Secondary | ICD-10-CM

## 2012-01-27 DIAGNOSIS — I2510 Atherosclerotic heart disease of native coronary artery without angina pectoris: Secondary | ICD-10-CM | POA: Diagnosis present

## 2012-01-27 DIAGNOSIS — I739 Peripheral vascular disease, unspecified: Secondary | ICD-10-CM | POA: Diagnosis present

## 2012-01-27 DIAGNOSIS — R351 Nocturia: Secondary | ICD-10-CM

## 2012-01-27 DIAGNOSIS — Z794 Long term (current) use of insulin: Secondary | ICD-10-CM

## 2012-01-27 DIAGNOSIS — E039 Hypothyroidism, unspecified: Secondary | ICD-10-CM | POA: Diagnosis present

## 2012-01-27 DIAGNOSIS — Z8249 Family history of ischemic heart disease and other diseases of the circulatory system: Secondary | ICD-10-CM

## 2012-01-27 DIAGNOSIS — R42 Dizziness and giddiness: Secondary | ICD-10-CM

## 2012-01-27 DIAGNOSIS — K921 Melena: Secondary | ICD-10-CM | POA: Diagnosis present

## 2012-01-27 DIAGNOSIS — E1165 Type 2 diabetes mellitus with hyperglycemia: Secondary | ICD-10-CM

## 2012-01-27 DIAGNOSIS — D375 Neoplasm of uncertain behavior of rectum: Secondary | ICD-10-CM | POA: Diagnosis present

## 2012-01-27 DIAGNOSIS — D136 Benign neoplasm of pancreas: Secondary | ICD-10-CM

## 2012-01-27 DIAGNOSIS — M810 Age-related osteoporosis without current pathological fracture: Secondary | ICD-10-CM | POA: Diagnosis present

## 2012-01-27 DIAGNOSIS — A09 Infectious gastroenteritis and colitis, unspecified: Principal | ICD-10-CM | POA: Diagnosis present

## 2012-01-27 DIAGNOSIS — I129 Hypertensive chronic kidney disease with stage 1 through stage 4 chronic kidney disease, or unspecified chronic kidney disease: Secondary | ICD-10-CM | POA: Diagnosis present

## 2012-01-27 DIAGNOSIS — E1122 Type 2 diabetes mellitus with diabetic chronic kidney disease: Secondary | ICD-10-CM | POA: Diagnosis present

## 2012-01-27 DIAGNOSIS — D371 Neoplasm of uncertain behavior of stomach: Secondary | ICD-10-CM | POA: Diagnosis present

## 2012-01-27 DIAGNOSIS — IMO0001 Reserved for inherently not codable concepts without codable children: Secondary | ICD-10-CM

## 2012-01-27 DIAGNOSIS — I82629 Acute embolism and thrombosis of deep veins of unspecified upper extremity: Secondary | ICD-10-CM

## 2012-01-27 DIAGNOSIS — E119 Type 2 diabetes mellitus without complications: Secondary | ICD-10-CM

## 2012-01-27 DIAGNOSIS — I4891 Unspecified atrial fibrillation: Secondary | ICD-10-CM | POA: Diagnosis present

## 2012-01-27 DIAGNOSIS — Z85820 Personal history of malignant melanoma of skin: Secondary | ICD-10-CM

## 2012-01-27 DIAGNOSIS — K219 Gastro-esophageal reflux disease without esophagitis: Secondary | ICD-10-CM

## 2012-01-27 DIAGNOSIS — N189 Chronic kidney disease, unspecified: Secondary | ICD-10-CM | POA: Diagnosis present

## 2012-01-27 DIAGNOSIS — Z7189 Other specified counseling: Secondary | ICD-10-CM

## 2012-01-27 DIAGNOSIS — E1139 Type 2 diabetes mellitus with other diabetic ophthalmic complication: Secondary | ICD-10-CM | POA: Diagnosis present

## 2012-01-27 DIAGNOSIS — E785 Hyperlipidemia, unspecified: Secondary | ICD-10-CM | POA: Diagnosis present

## 2012-01-27 DIAGNOSIS — K529 Noninfective gastroenteritis and colitis, unspecified: Secondary | ICD-10-CM | POA: Diagnosis present

## 2012-01-27 DIAGNOSIS — I10 Essential (primary) hypertension: Secondary | ICD-10-CM

## 2012-01-27 DIAGNOSIS — R011 Cardiac murmur, unspecified: Secondary | ICD-10-CM

## 2012-01-27 DIAGNOSIS — E11319 Type 2 diabetes mellitus with unspecified diabetic retinopathy without macular edema: Secondary | ICD-10-CM | POA: Diagnosis present

## 2012-01-27 DIAGNOSIS — Z79899 Other long term (current) drug therapy: Secondary | ICD-10-CM

## 2012-01-27 DIAGNOSIS — I480 Paroxysmal atrial fibrillation: Secondary | ICD-10-CM | POA: Diagnosis present

## 2012-01-27 DIAGNOSIS — K5900 Constipation, unspecified: Secondary | ICD-10-CM | POA: Diagnosis present

## 2012-01-27 DIAGNOSIS — D126 Benign neoplasm of colon, unspecified: Secondary | ICD-10-CM

## 2012-01-27 DIAGNOSIS — A5211 Tabes dorsalis: Secondary | ICD-10-CM | POA: Diagnosis present

## 2012-01-27 DIAGNOSIS — M216X9 Other acquired deformities of unspecified foot: Secondary | ICD-10-CM | POA: Diagnosis present

## 2012-01-27 DIAGNOSIS — Z86010 Personal history of colonic polyps: Secondary | ICD-10-CM

## 2012-01-27 DIAGNOSIS — S88119A Complete traumatic amputation at level between knee and ankle, unspecified lower leg, initial encounter: Secondary | ICD-10-CM

## 2012-01-27 DIAGNOSIS — R1030 Lower abdominal pain, unspecified: Secondary | ICD-10-CM

## 2012-01-27 HISTORY — DX: Cerebral infarction, unspecified: I63.9

## 2012-01-27 LAB — TYPE AND SCREEN
ABO/RH(D): O POS
Antibody Screen: NEGATIVE

## 2012-01-27 LAB — URINALYSIS, ROUTINE W REFLEX MICROSCOPIC
Bilirubin Urine: NEGATIVE
Hgb urine dipstick: NEGATIVE
Ketones, ur: 15 mg/dL — AB
Protein, ur: NEGATIVE mg/dL
Urobilinogen, UA: 0.2 mg/dL (ref 0.0–1.0)

## 2012-01-27 LAB — COMPREHENSIVE METABOLIC PANEL
ALT: 14 U/L (ref 0–35)
AST: 16 U/L (ref 0–37)
Alkaline Phosphatase: 50 U/L (ref 39–117)
CO2: 29 mEq/L (ref 19–32)
Chloride: 98 mEq/L (ref 96–112)
GFR calc non Af Amer: 47 mL/min — ABNORMAL LOW (ref >90)
Glucose, Bld: 316 mg/dL — ABNORMAL HIGH (ref 70–99)
Sodium: 134 mEq/L — ABNORMAL LOW (ref 135–145)
Total Bilirubin: 0.8 mg/dL (ref 0.3–1.2)

## 2012-01-27 LAB — DIFFERENTIAL
Basophils Absolute: 0 10*3/uL (ref 0.0–0.1)
Lymphocytes Relative: 9 % — ABNORMAL LOW (ref 12–46)
Lymphs Abs: 0.8 10*3/uL (ref 0.7–4.0)
Neutro Abs: 7.7 10*3/uL (ref 1.7–7.7)

## 2012-01-27 LAB — URINE MICROSCOPIC-ADD ON

## 2012-01-27 LAB — ABO/RH: ABO/RH(D): O POS

## 2012-01-27 LAB — PROTIME-INR: Prothrombin Time: 28.5 seconds — ABNORMAL HIGH (ref 11.6–15.2)

## 2012-01-27 LAB — GLUCOSE, CAPILLARY: Glucose-Capillary: 236 mg/dL — ABNORMAL HIGH (ref 70–99)

## 2012-01-27 LAB — CBC
HCT: 36.8 % (ref 36.0–46.0)
Platelets: 134 10*3/uL — ABNORMAL LOW (ref 150–400)
RBC: 4.11 MIL/uL (ref 3.87–5.11)
RDW: 13.8 % (ref 11.5–15.5)
WBC: 8.9 10*3/uL (ref 4.0–10.5)

## 2012-01-27 LAB — OCCULT BLOOD, POC DEVICE: Fecal Occult Bld: POSITIVE

## 2012-01-27 MED ORDER — PREDNISONE 5 MG PO TABS
5.0000 mg | ORAL_TABLET | Freq: Every morning | ORAL | Status: DC
  Administered 2012-01-28 – 2012-02-01 (×4): 5 mg via ORAL
  Filled 2012-01-27 (×5): qty 1

## 2012-01-27 MED ORDER — MAGNESIUM OXIDE 400 (241.3 MG) MG PO TABS
400.0000 mg | ORAL_TABLET | Freq: Every day | ORAL | Status: DC
  Administered 2012-01-28 – 2012-02-01 (×4): 400 mg via ORAL
  Filled 2012-01-27 (×5): qty 1

## 2012-01-27 MED ORDER — ONDANSETRON HCL 4 MG/2ML IJ SOLN
4.0000 mg | Freq: Once | INTRAMUSCULAR | Status: AC
  Administered 2012-01-27: 4 mg via INTRAVENOUS
  Filled 2012-01-27: qty 2

## 2012-01-27 MED ORDER — ONDANSETRON HCL 4 MG/2ML IJ SOLN
4.0000 mg | Freq: Four times a day (QID) | INTRAMUSCULAR | Status: DC | PRN
  Administered 2012-01-28 – 2012-01-29 (×3): 4 mg via INTRAVENOUS
  Filled 2012-01-27 (×3): qty 2

## 2012-01-27 MED ORDER — TACROLIMUS 1 MG PO CAPS
1.0000 mg | ORAL_CAPSULE | Freq: Every day | ORAL | Status: DC
  Administered 2012-01-28 – 2012-01-31 (×5): 1 mg via ORAL
  Filled 2012-01-27 (×6): qty 1

## 2012-01-27 MED ORDER — SODIUM CHLORIDE 0.9 % IV SOLN
1000.0000 mL | INTRAVENOUS | Status: DC
  Administered 2012-01-27: 1000 mL via INTRAVENOUS

## 2012-01-27 MED ORDER — INSULIN ASPART 100 UNIT/ML ~~LOC~~ SOLN
0.0000 [IU] | SUBCUTANEOUS | Status: DC
  Administered 2012-01-28 (×2): 2 [IU] via SUBCUTANEOUS
  Administered 2012-01-28 – 2012-01-29 (×4): 1 [IU] via SUBCUTANEOUS
  Administered 2012-01-30: 2 [IU] via SUBCUTANEOUS
  Administered 2012-01-30: 5 [IU] via SUBCUTANEOUS
  Administered 2012-01-31: 2 [IU] via SUBCUTANEOUS
  Administered 2012-01-31: 3 [IU] via SUBCUTANEOUS
  Administered 2012-01-31: 2 [IU] via SUBCUTANEOUS
  Administered 2012-01-31 (×2): 3 [IU] via SUBCUTANEOUS
  Administered 2012-02-01: 1 [IU] via SUBCUTANEOUS
  Administered 2012-02-01: 3 [IU] via SUBCUTANEOUS

## 2012-01-27 MED ORDER — HYDROCODONE-ACETAMINOPHEN 5-325 MG PO TABS
1.0000 | ORAL_TABLET | ORAL | Status: DC | PRN
  Filled 2012-01-27: qty 1

## 2012-01-27 MED ORDER — SODIUM CHLORIDE 0.9 % IJ SOLN
3.0000 mL | Freq: Two times a day (BID) | INTRAMUSCULAR | Status: DC
  Administered 2012-01-28 – 2012-01-31 (×6): 3 mL via INTRAVENOUS

## 2012-01-27 MED ORDER — SODIUM CHLORIDE 0.9 % IV SOLN
1000.0000 mL | Freq: Once | INTRAVENOUS | Status: AC
  Administered 2012-01-27: 1000 mL via INTRAVENOUS

## 2012-01-27 MED ORDER — ACETAMINOPHEN 650 MG RE SUPP: 650.0000 mg | Freq: Four times a day (QID) | RECTAL | Status: DC | PRN

## 2012-01-27 MED ORDER — FLUDROCORTISONE ACETATE 0.1 MG PO TABS
0.1000 mg | ORAL_TABLET | ORAL | Status: DC
  Administered 2012-01-28 – 2012-02-01 (×2): 0.1 mg via ORAL
  Filled 2012-01-27 (×3): qty 1

## 2012-01-27 MED ORDER — ALBUTEROL SULFATE (5 MG/ML) 0.5% IN NEBU: 2.5000 mg | INHALATION_SOLUTION | RESPIRATORY_TRACT | Status: DC | PRN

## 2012-01-27 MED ORDER — METRONIDAZOLE IN NACL 5-0.79 MG/ML-% IV SOLN
500.0000 mg | Freq: Three times a day (TID) | INTRAVENOUS | Status: DC
  Administered 2012-01-28 – 2012-01-31 (×11): 500 mg via INTRAVENOUS
  Filled 2012-01-27 (×15): qty 100

## 2012-01-27 MED ORDER — ONDANSETRON HCL 4 MG PO TABS: 4.0000 mg | ORAL_TABLET | Freq: Four times a day (QID) | ORAL | Status: DC | PRN

## 2012-01-27 MED ORDER — IOHEXOL 300 MG/ML  SOLN: 65.0000 mL | Freq: Once | INTRAMUSCULAR | Status: DC | PRN

## 2012-01-27 MED ORDER — MYCOPHENOLATE SODIUM 180 MG PO TBEC
360.0000 mg | DELAYED_RELEASE_TABLET | Freq: Two times a day (BID) | ORAL | Status: DC
  Administered 2012-01-28 – 2012-02-01 (×9): 360 mg via ORAL
  Filled 2012-01-27 (×12): qty 2

## 2012-01-27 MED ORDER — ACETAMINOPHEN 325 MG PO TABS
650.0000 mg | ORAL_TABLET | Freq: Four times a day (QID) | ORAL | Status: DC | PRN
  Administered 2012-01-29 (×2): 650 mg via ORAL
  Filled 2012-01-27 (×2): qty 2

## 2012-01-27 MED ORDER — SODIUM CHLORIDE 0.9 % IV SOLN
1000.0000 mL | INTRAVENOUS | Status: DC
  Administered 2012-01-28 – 2012-01-31 (×4): 1000 mL via INTRAVENOUS

## 2012-01-27 MED ORDER — LEVOTHYROXINE SODIUM 175 MCG PO TABS
175.0000 ug | ORAL_TABLET | ORAL | Status: DC
  Administered 2012-01-28 – 2012-01-29 (×2): 175 ug via ORAL
  Filled 2012-01-27 (×2): qty 1

## 2012-01-27 MED ORDER — PANTOPRAZOLE SODIUM 40 MG PO TBEC
40.0000 mg | DELAYED_RELEASE_TABLET | Freq: Every day | ORAL | Status: DC
  Administered 2012-01-28 – 2012-02-01 (×5): 40 mg via ORAL
  Filled 2012-01-27 (×5): qty 1

## 2012-01-27 MED ORDER — TACROLIMUS 1 MG PO CAPS
2.0000 mg | ORAL_CAPSULE | Freq: Every morning | ORAL | Status: DC
  Administered 2012-01-28 – 2012-02-01 (×4): 2 mg via ORAL
  Filled 2012-01-27 (×5): qty 2

## 2012-01-27 MED ORDER — OXYBUTYNIN CHLORIDE 10 % TD GEL
2.0000 | Freq: Every morning | TRANSDERMAL | Status: DC
  Filled 2012-01-27: qty 2

## 2012-01-27 MED ORDER — ATORVASTATIN CALCIUM 10 MG PO TABS
10.0000 mg | ORAL_TABLET | ORAL | Status: DC
  Administered 2012-01-28 – 2012-02-01 (×2): 10 mg via ORAL
  Filled 2012-01-27 (×3): qty 1

## 2012-01-27 MED ORDER — SERTRALINE HCL 25 MG PO TABS
25.0000 mg | ORAL_TABLET | Freq: Every morning | ORAL | Status: DC
  Administered 2012-01-28 – 2012-02-01 (×4): 25 mg via ORAL
  Filled 2012-01-27 (×5): qty 1

## 2012-01-27 MED ORDER — CIPROFLOXACIN IN D5W 400 MG/200ML IV SOLN
400.0000 mg | Freq: Two times a day (BID) | INTRAVENOUS | Status: DC
  Administered 2012-01-28 – 2012-01-31 (×7): 400 mg via INTRAVENOUS
  Filled 2012-01-27 (×10): qty 200

## 2012-01-27 MED ORDER — INSULIN GLARGINE 100 UNIT/ML ~~LOC~~ SOLN
13.0000 [IU] | SUBCUTANEOUS | Status: DC
Start: 2012-01-28 — End: 2012-01-29
  Administered 2012-01-28 – 2012-01-29 (×2): 13 [IU] via SUBCUTANEOUS

## 2012-01-27 MED ORDER — LEVOTHYROXINE SODIUM 150 MCG PO TABS
150.0000 ug | ORAL_TABLET | ORAL | Status: DC
  Administered 2012-01-30 – 2012-02-01 (×3): 150 ug via ORAL
  Filled 2012-01-27 (×4): qty 1

## 2012-01-27 MED ORDER — GUAIFENESIN-DM 100-10 MG/5ML PO SYRP
5.0000 mL | ORAL_SOLUTION | ORAL | Status: DC | PRN
  Filled 2012-01-27: qty 5

## 2012-01-27 NOTE — ED Notes (Signed)
Pt assisted to bathroom with wheelchair, husband, and myself. Pt and family informed to notify when complete, so that we can help her back to bed. Will continue to monitor.

## 2012-01-27 NOTE — ED Notes (Signed)
Admitting at bedside 

## 2012-01-27 NOTE — ED Notes (Signed)
Pt stated that she was having n/v/d since last night. She stated that she was having blood in her stool. She also said that she was having generalized cramping in her lower abdomen. Currently she is not having any abdominal pain or n/v. However, she stated that he loose stools have started back in the last 1 hour. She stated that it did not see any blood in her stool. Will continue to monitor.

## 2012-01-27 NOTE — ED Notes (Signed)
Family at bedside. 

## 2012-01-27 NOTE — ED Provider Notes (Signed)
History     CSN: [id number]  Arrival date & time 01/27/12  1031   First MD Initiated Contact with Patient 01/27/12 1244      Chief Complaint  Patient presents with  . Nausea  . Emesis  . Diarrhea    (Consider location/radiation/quality/duration/timing/severity/associated sxs/prior treatment) HPI Comments: A. patient found at her husband who had onset of rectal bleeding last night. She also had some vomiting. She went she went to the bathroom last night there was no blood in the toilet. Her husband says her stool was covered with blood and was bloody mucus. She's had mild lower abdominal pain. She is a lady with multiple medical problems having had a renal transplant about 11 years ago. She is diabetic on insulin. She's had a prior CVA. She has had a prior left BK amputation for vascular insufficiency.  Patient is a 71 y.o. female presenting with diarrhea and hematochezia.  Diarrhea The primary symptoms include vomiting, diarrhea and hematochezia.  Rectal Bleeding  The current episode started yesterday. Episode frequency: She has had 2 or 3 episodes of rectal bleeding on having a bowel movement. There is a prior history of constipation. The problem has been unchanged. Pain severity now: She has mild bilateral lower abdominal pain. The stool is described as soft. There was no prior successful therapy. Associated symptoms include diarrhea and vomiting. Past medical history comments: Chronic constipation..    Past Medical History  Diagnosis Date  . Coronary artery disease   . Chronic kidney disease     s/p cadaveric renal transplant in 2001  . Diabetes mellitus   . PVD (peripheral vascular disease)   . History of orthostatic hypotension   . Hyperlipidemia   . Hypertension   . Hypothyroidism   . Anemia   . Pancreatic cyst   . Osteoporosis     recurrent fractures  . Melanoma   . Carotid bruit     left  . Adenomatous polyp   . GERD (gastroesophageal reflux disease)   . Gastritis    . CVA (cerebral infarction)   . Bradycardia   . Renal artery stenosis   . Diabetic retinopathy   . Renal osteodystrophy   . Hyperparathyroidism   . Leg ulcer     from poor fitting prosthetic  . H/O immunosuppressive therapy   . 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 Fx wrist   . Fx ankle   . Neuropathy   . Nephropathy   . Carotid artery stenosis     mild  . Stroke     Past Surgical History  Procedure Date  . Kidney transplant 08/15/2000    [facility] in [place]  . Below knee leg amputation     left  . Appendectomy   . Cataract extraction   . Cholecystectomy   . Abdominal hysterectomy   . Thyroid surgery   . Tonsillectomy   . Cesarean section     x 2  . Vitrectomy     right eye  . Coronary artery bypass graft   . Colonoscopy w/ biopsies and polypectomy 02/18/2009    adenomatous polyps, diverticulosis, internal hemorrhoids  . Eus 02/04/2010    w/FNA, pancreatic cyst  . Upper gastrointestinal endoscopy 12/01/2009    Family History  Problem Relation Age of Onset  . Stroke Mother 20  . Heart attack Father 50  . Colon cancer Neg Hx   . Diabetes Neg Hx     History  Substance  Use Topics  . Smoking status: Never Smoker   . Smokeless tobacco: Never Used  . Alcohol Use: No    OB History    Grav Para Term Preterm Abortions TAB SAB Ect Mult Living                  Review of Systems  Constitutional: Negative.   HENT: Negative.   Eyes: Negative.   Respiratory: Negative.   Cardiovascular: Negative.   Gastrointestinal: Positive for vomiting, diarrhea, hematochezia and anal bleeding.  Genitourinary: Negative.   Musculoskeletal: Negative.   Neurological: Negative.   Psychiatric/Behavioral: Negative.     Allergies  Review of patient's allergies indicates no known allergies.  Home Medications   Current Outpatient Rx  Name Route Sig Dispense Refill  . ATORVASTATIN CALCIUM 10 MG PO TABS Oral Take 10 mg by mouth every other day.     Marland Kitchen  CALCIUM CARBONATE-VITAMIN D 600-400 MG-UNIT PO TABS Oral Take 1 tablet by mouth daily.     Marland Kitchen FLUDROCORTISONE ACETATE 0.1 MG PO TABS Oral Take 0.1 mg by mouth every other day.    Marland Kitchen HYOSCYAMINE SULFATE 0.125 MG PO TABS Oral Take 0.125 mg by mouth daily as needed. For bowel pain.    . INSULIN GLARGINE 100 UNIT/ML [place] SOLN Subcutaneous Inject 13 Units into the skin every morning.     . INSULIN LISPRO (HUMAN) 100 UNIT/ML [place] SOLN Subcutaneous Inject 2-9 Units into the skin 3 (three) times daily before meals. Per sliding scale.    Marland Kitchen LEVOTHYROXINE SODIUM 150 MCG PO TABS Oral Take 150 mcg by mouth See admin instructions. Take 1 tablet daily Monday through Friday    . LEVOTHYROXINE SODIUM 175 MCG PO TABS Oral Take 175 mcg by mouth See admin instructions. 1 tablet daily on Saturday and Sunday    . MAGNESIUM 250 MG PO TABS Oral Take 250 mg by mouth daily.    . ADULT MULTIVITAMIN W/MINERALS CH Oral Take 1 tablet by mouth daily.    Marland Kitchen MYCOPHENOLATE SODIUM 360 MG PO TBEC Oral Take 360 mg by mouth 2 (two) times daily.     Marland Kitchen OMEPRAZOLE 20 MG PO CPDR Oral Take 20 mg by mouth every evening.    . OXYBUTYNIN CHLORIDE 10 % TD GEL Transdermal Place 2 packets onto the skin every morning.    Marland Kitchen PREDNISONE 5 MG PO TABS Oral Take 5 mg by mouth every morning.     Marland Kitchen SERTRALINE HCL 25 MG PO TABS Oral Take 25 mg by mouth every morning.     Marland Kitchen TACROLIMUS 0.5 MG PO CAPS Oral Take 1-2 mg by mouth 2 (two) times daily. 4 capsules every morning and 1mg  every evening    . WARFARIN SODIUM 2.5 MG PO TABS Oral Take 2.5-5 mg by mouth daily. Take 2 tablets daily, except take 1 tablet on Thursdays.    Marland Kitchen HYOSCYAMINE SULFATE 0.125 MG PO TABS Oral Take 1 tablet (0.125 mg total) by mouth every 4 (four) hours as needed for cramping. 30 tablet 3    BP 170/57  Pulse 65  Temp(Src) 98.7 F (37.1 C) (Oral)  Resp 16  Ht 5\' 4"  (1.626 m)  Wt 160 lb (72.576 kg)  BMI 27.46 kg/m2  SpO2 94%  Physical Exam  Constitutional: She appears well-developed  and well-nourished. No distress.  HENT:  Head: Normocephalic and atraumatic.  Right Ear: External ear normal.  Left Ear: External ear normal.  Mouth/Throat: Oropharynx is clear and moist.  Eyes: Conjunctivae and EOM are  normal. Pupils are equal, round, and reactive to light. No scleral icterus.  Neck: Normal range of motion. Neck supple.  Cardiovascular: Normal rate, regular rhythm and normal heart sounds.   Pulmonary/Chest: Effort normal and breath sounds normal.  Abdominal: Soft. She exhibits no distension. There is Tenderness: she has mild bilateral lower abdominal tenderness. There is no mass rebound or rigidity..  Genitourinary:       To exam shows no mass or tenderness. She has mucus and stool which is mixed with spots of blood.  Musculoskeletal:       She has a left AK amputation and is wearing a prosthesis. She has a prosthesis on the right ankle and foot for foot drop and a Charcot joint.  Skin: Skin is warm and dry.  Psychiatric: She has a normal mood and affect. Her behavior is normal.    ED Course  Procedures (including critical care time)  Labs Reviewed  CBC - Abnormal; Notable for the following:    Hemoglobin 11.7 (*)    Platelets 134 (*)    All other components within normal limits  DIFFERENTIAL - Abnormal; Notable for the following:    Neutrophils Relative 87 (*)    Lymphocytes Relative 9 (*)    All other components within normal limits  COMPREHENSIVE METABOLIC PANEL - Abnormal; Notable for the following:    Sodium 134 (*)    Glucose, Bld 316 (*)    BUN 35 (*)    Creatinine, Ser 1.13 (*)    Total Protein 5.4 (*)    Albumin 3.1 (*)    GFR calc non Af Amer 47 (*)    GFR calc Af Amer 55 (*)    All other components within normal limits  URINALYSIS, ROUTINE W REFLEX MICROSCOPIC - Abnormal; Notable for the following:    Color, Urine AMBER (*) BIOCHEMICALS MAY BE AFFECTED BY COLOR   Glucose, UA 250 (*)    Ketones, ur 15 (*)    Leukocytes, UA TRACE (*)    All  other components within normal limits  LIPASE, BLOOD - Abnormal; Notable for the following:    Lipase 9 (*)    All other components within normal limits  PROTIME-INR - Abnormal; Notable for the following:    Prothrombin Time 28.5 (*)    INR 2.63 (*)    All other components within normal limits  APTT - Abnormal; Notable for the following:    aPTT 41 (*)    All other components within normal limits  URINE MICROSCOPIC-ADD ON - Abnormal; Notable for the following:    Squamous Epithelial / LPF FEW (*)    All other components within normal limits  TYPE AND SCREEN  ABO/RH  OCCULT BLOOD X 1 CARD TO LAB, STOOL   Dg Abd Acute W/chest  01/27/2012  *RADIOLOGY REPORT*  Clinical Data: Nausea, vomiting, diarrhea, abdominal pain  ACUTE ABDOMEN SERIES (ABDOMEN 2 VIEW & CHEST 1 VIEW)  Comparison: CT abdomen pelvis of 12/06/2011 and abdomen films of 11/11/2011, and chest x-ray of 12/14/2010  Findings: No active infiltrate or effusion is seen.  There is moderate cardiomegaly present.  Considerable calcification of the mitral annulus is noted.  Median sternotomy sutures are present.  Supine and left lower decubitus films of the abdomen were obtained. No bowel obstruction is seen.  No free air is noted.  Multiple surgical clips are present in the left lower pelvis and abdomen. There is considerable arterial calcification diffusely.  The bones are osteopenic.  IMPRESSION:  1.  Cardiomegaly.  No active lung disease. 2.  No bowel obstruction.  No free air. 3.  Considerable arterial calcification.  Original Report Authenticated By: Juline Patch, M.D.   3:50 PM Patient was seen and had physical examination. CT abdomen and pelvis will be ordered. I discussed her CT x-ray with Ashley Murrain M.D., radiologist, because he is diabetic and has had a renal transplant.  7:16 PM Results for orders placed during the hospital encounter of 01/27/12  CBC      Component Value Range   WBC 8.9  4.0 - 10.5 (K/uL)   RBC 4.11  3.87 -  5.11 (MIL/uL)   Hemoglobin 11.7 (*) 12.0 - 15.0 (g/dL)   HCT 96.0  45.4 - 09.8 (%)   MCV 89.5  78.0 - 100.0 (fL)   MCH 28.5  26.0 - 34.0 (pg)   MCHC 31.8  30.0 - 36.0 (g/dL)   RDW 11.9  14.7 - 82.9 (%)   Platelets 134 (*) 150 - 400 (K/uL)  DIFFERENTIAL      Component Value Range   Neutrophils Relative 87 (*) 43 - 77 (%)   Neutro Abs 7.7  1.7 - 7.7 (K/uL)   Lymphocytes Relative 9 (*) 12 - 46 (%)   Lymphs Abs 0.8  0.7 - 4.0 (K/uL)   Monocytes Relative 4  3 - 12 (%)   Monocytes Absolute 0.4  0.1 - 1.0 (K/uL)   Eosinophils Relative 0  0 - 5 (%)   Eosinophils Absolute 0.0  0.0 - 0.7 (K/uL)   Basophils Relative 0  0 - 1 (%)   Basophils Absolute 0.0  0.0 - 0.1 (K/uL)  COMPREHENSIVE METABOLIC PANEL      Component Value Range   Sodium 134 (*) 135 - 145 (mEq/L)   Potassium 4.9  3.5 - 5.1 (mEq/L)   Chloride 98  96 - 112 (mEq/L)   CO2 29  19 - 32 (mEq/L)   Glucose, Bld 316 (*) 70 - 99 (mg/dL)   BUN 35 (*) 6 - 23 (mg/dL)   Creatinine, Ser 5.62 (*) 0.50 - 1.10 (mg/dL)   Calcium 9.3  8.4 - 13.0 (mg/dL)   Total Protein 5.4 (*) 6.0 - 8.3 (g/dL)   Albumin 3.1 (*) 3.5 - 5.2 (g/dL)   AST 16  0 - 37 (U/L)   ALT 14  0 - 35 (U/L)   Alkaline Phosphatase 50  39 - 117 (U/L)   Total Bilirubin 0.8  0.3 - 1.2 (mg/dL)   GFR calc non Af Amer 47 (*) >90 (mL/min)   GFR calc Af Amer 55 (*) >90 (mL/min)  URINALYSIS, ROUTINE W REFLEX MICROSCOPIC      Component Value Range   Color, Urine AMBER (*) YELLOW    APPearance CLEAR  CLEAR    Specific Gravity, Urine 1.025  1.005 - 1.030    pH 5.5  5.0 - 8.0    Glucose, UA 250 (*) NEGATIVE (mg/dL)   Hgb urine dipstick NEGATIVE  NEGATIVE    Bilirubin Urine NEGATIVE  NEGATIVE    Ketones, ur 15 (*) NEGATIVE (mg/dL)   Protein, ur NEGATIVE  NEGATIVE (mg/dL)   Urobilinogen, UA 0.2  0.0 - 1.0 (mg/dL)   Nitrite NEGATIVE  NEGATIVE    Leukocytes, UA TRACE (*) NEGATIVE   LIPASE, BLOOD      Component Value Range   Lipase 9 (*) 11 - 59 (U/L)  PROTIME-INR      Component  Value Range   Prothrombin Time 28.5 (*) 11.6 -  15.2 (seconds)   INR 2.63 (*) 0.00 - 1.49   APTT      Component Value Range   aPTT 41 (*) 24 - 37 (seconds)  TYPE AND SCREEN      Component Value Range   ABO/RH(D) O POS     Antibody Screen NEG     Sample Expiration 01/30/2012    ABO/RH      Component Value Range   ABO/RH(D) O POS    URINE MICROSCOPIC-ADD ON      Component Value Range   Squamous Epithelial / LPF FEW (*) RARE    WBC, UA 0-2  <3 (WBC/hpf)   RBC / HPF 0-2  <3 (RBC/hpf)   Bacteria, UA RARE  RARE   OCCULT BLOOD, POC DEVICE      Component Value Range   Fecal Occult Bld POSITIVE     Ct Abdomen Pelvis W Contrast  01/27/2012  *RADIOLOGY REPORT*  Clinical Data: Nausea/vomiting/diarrhea, blood in stool, kidney transplant  CT ABDOMEN AND PELVIS WITH CONTRAST  Technique:  Multidetector CT imaging of the abdomen and pelvis was performed following the standard protocol during bolus administration of intravenous contrast.  Contrast:  65 ml Omnipaque 300 IV  Comparison: [place] CT abdomen pelvis dated 12/06/2011, [facility] MRI abdomen dated 07/04/2011  Findings: Mild linear scarring at the left lung base.  Cardiomegaly.  Coronary atherosclerosis with mitral valve annular calcifications.  Small hiatal hernia.  Liver, spleen, and adrenal glands are within normal limits.  Stable cystic pancreatic lesions associated with the pancreatic head/uncinate process (series 2/images 22 and 25), better visualized/characterized on prior MRI.  Status post cholecystectomy.  No intrahepatic ductal dilatation. Stable common bile duct measuring up to 9 mm.  Native renal atrophy.  Left lower quadrant renal transplant with subcentimeter upper pole cysts and a stable extrarenal pelvis without frank hydronephrosis.  No evidence of bowel obstruction.  Mass-like thickening of the cecum (series 2/image 51; coronal image 54), worrisome for colonic adenocarcinoma.  Small pericecal lymph nodes measuring up to 5 mm short  axis (series 2/image 48).  Diffuse wall thickening involving a relatively long segment of sigmoid colon (series 2/image 64), suspicious for colitis.  No drainable fluid collection or abscess.  Atherosclerotic calcifications of the abdominal aorta and branch vessels.  Extensive vascular calcifications.  No abdominopelvic ascites.  No suspicious abdominopelvic lymphadenopathy.  Status post hysterectomy.  No adnexal masses.  Bladder is mildly thick-walled.  Mild degenerative changes of the visualized thoracolumbar spine.  IMPRESSION: Mass-like thickening of the cecum, worrisome for colonic adenocarcinoma.  Small pericecal lymph nodes measuring up to 5 mm short axis.  Colonoscopy is suggested for further evaluation.  Diffuse wall thickening involving the sigmoid colon, suspicious for colitis.  Left lower quadrant renal transplant.  Stable extrarenal pelvis without frank hydronephrosis.  Stable cystic pancreatic lesions associated with the pancreatic head/uncinate process, better visualized/characterized on prior MRI.  These results were called by telephone on 01/27/2012  at  1850 hours to  Dr Carleene Cooper, who verbally acknowledged these results.  Original Report Authenticated By: Charline Bills, M.D.   Dg Abd Acute W/chest  01/27/2012  *RADIOLOGY REPORT*  Clinical Data: Nausea, vomiting, diarrhea, abdominal pain  ACUTE ABDOMEN SERIES (ABDOMEN 2 VIEW & CHEST 1 VIEW)  Comparison: CT abdomen pelvis of 12/06/2011 and abdomen films of 11/11/2011, and chest x-ray of 12/14/2010  Findings: No active infiltrate or effusion is seen.  There is moderate cardiomegaly present.  Considerable calcification of the mitral annulus is noted.  Median  sternotomy sutures are present.  Supine and left lower decubitus films of the abdomen were obtained. No bowel obstruction is seen.  No free air is noted.  Multiple surgical clips are present in the left lower pelvis and abdomen. There is considerable arterial calcification diffusely.  The  bones are osteopenic.  IMPRESSION:  1.  Cardiomegaly.  No active lung disease. 2.  No bowel obstruction.  No free air. 3.  Considerable arterial calcification.  Original Report Authenticated By: Juline Patch, M.D.   7:16 PM CT showed sigmoid colitis and also showed a cecal mass.  Will need admission for treatment of colitis and ultimately will need colonoscopy to diagnose the cecal mass.  7:41 PM Case discussed with Dr. Adela Glimpse.  Admit to [facility].    1. Colitis, infectious   2. Mass of cecum         Carleene Cooper III, MD 01/27/12 1944

## 2012-01-27 NOTE — Progress Notes (Signed)
Patient has an order for SCD's but patient's left leg is a prosthetic and right leg has a metal brace that patient refuses to take off and states that it is permanent. Provider on call notified and order was discontinued. Elizabeth [name]

## 2012-01-27 NOTE — ED Notes (Signed)
AIDET performed. 

## 2012-01-27 NOTE — H&P (Signed)
PCP:   Irena Cords, MD, MD  Cardiologist: [facility] in [place] GI: [facility] Chief Complaint:  Nausea vomiting   HPI: Elizabeth [name] is a 71 y.o. female   has a past medical history of Coronary artery disease; Chronic kidney disease; Diabetes mellitus; PVD (peripheral vascular disease); History of orthostatic hypotension; Hyperlipidemia; Hypothyroidism; Anemia; Pancreatic cyst; Osteoporosis; Melanoma; Carotid bruit; Adenomatous polyp; GERD (gastroesophageal reflux disease); Gastritis; CVA (cerebral infarction); Bradycardia; Renal artery stenosis; Diabetic retinopathy; Renal osteodystrophy; Hyperparathyroidism; Leg ulcer; H/O immunosuppressive therapy; Renal disease; Fx wrist; Fx ankle; Neuropathy; Nephropathy; Carotid artery stenosis; and Stroke.   Presented with  She have not been feeling well for more than 6 months with bouts of constipation and diarrhea as well as nausea. For the past few days she started to have sever nausea and vomiting with now bloody diarrhea. She had a fever yesterday 99.1-100.4. 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 She has been seen by Dr. Leone Payor for her constipation. Last colonoscopy was in 6/302010 and was normal. A CT scan was done in ER that was found to have cecal mass. No recent weight loss. No family history of colon cancer. She have had abdominal pain for few months a lot of suprapubic pain. She has hx of CVA with Left side vision loss. She is taking coumadin for hx of atrial fibrillation.   Review of Systems:    Pertinent positives include:Fevers, chills, fatigue,  nausea, vomiting, diarrhea,   Constitutional:  No weight loss, night sweats, weight loss  HEENT:  No headaches, Difficulty swallowing,Tooth/dental problems,Sore throat,  No sneezing, itching, ear ache, nasal congestion, post nasal drip,  Cardio-vascular:  No chest pain, Orthopnea, PND, anasarca, dizziness,  palpitations.no Bilateral lower extremity swelling  GI:  No heartburn, indigestion, change in bowel habits, loss of appetite, melena, blood in stool, hematemesis Resp:  no shortness of breath at rest. No dyspnea on exertion, No excess mucus, no productive cough, No non-productive cough, No coughing up of blood.No change in color of mucus.No wheezing. Skin:  no rash or lesions. No jaundice GU:  no dysuria, change in color of urine, no urgency or frequency. No straining to urinate.  No flank pain.  Musculoskeletal:  No joint pain or no joint swelling. No decreased range of motion. No back pain.  Psych:  No change in mood or affect. No depression or anxiety. No memory loss.  Neuro: no localizing neurological complaints, no tingling, no weakness, no double vision, no gait abnormality, no slurred speech, no confusion  Otherwise ROS are negative except for above, 10 systems were reviewed  Past Medical History: Past Medical History  Diagnosis Date  . Coronary artery disease   . Chronic kidney disease     s/p cadaveric renal transplant in 2001  . Diabetes mellitus   . PVD (peripheral vascular disease)   . History of orthostatic hypotension   . Hyperlipidemia   . Hypothyroidism   . Anemia   . Pancreatic cyst   . Osteoporosis     recurrent fractures  . Melanoma   . Carotid bruit     left  . Adenomatous polyp   . GERD (gastroesophageal reflux disease)   . Gastritis   . CVA (cerebral infarction)   . Bradycardia   . Renal artery stenosis   . Diabetic retinopathy   . Renal osteodystrophy   . Hyperparathyroidism   . Leg ulcer     from poor fitting prosthetic  .  H/O immunosuppressive therapy   . 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 Fx wrist   . Fx ankle   . Neuropathy   . Nephropathy   . Carotid artery stenosis     mild  . Stroke    Past Surgical History  Procedure Date  . Kidney transplant 08/15/2000    [facility] in [place]  . Below knee leg  amputation     left, [place] joint from neuropathy  . Appendectomy   . Cataract extraction   . Cholecystectomy   . Abdominal hysterectomy   . Thyroid surgery   . Tonsillectomy   . Cesarean section     x 2  . Vitrectomy     right eye  . Coronary artery bypass graft   . Colonoscopy w/ biopsies and polypectomy 02/18/2009    adenomatous polyps, diverticulosis, internal hemorrhoids  . Eus 02/04/2010    w/FNA, pancreatic cyst  . Upper gastrointestinal endoscopy 12/01/2009     Medications: Prior to Admission medications   Medication Sig Start Date End Date Taking? Authorizing Provider  atorvastatin (LIPITOR) 10 MG tablet Take 10 mg by mouth every other day.    Yes Historical Provider, MD  Calcium Carbonate-Vitamin D (CALCIUM 600-D) 600-400 MG-UNIT per tablet Take 1 tablet by mouth daily.    Yes Historical Provider, MD  fludrocortisone (FLORINEF) 0.1 MG tablet Take 0.1 mg by mouth every other day.   Yes Historical Provider, MD  hyoscyamine (LEVSIN, ANASPAZ) 0.125 MG tablet Take 0.125 mg by mouth daily as needed. For bowel pain.   Yes Historical Provider, MD  insulin glargine (LANTUS) 100 UNIT/ML injection Inject 13 Units into the skin every morning.    Yes Historical Provider, MD  insulin lispro (HUMALOG) 100 UNIT/ML injection Inject 2-9 Units into the skin 3 (three) times daily before meals. Per sliding scale.   Yes Historical Provider, MD  levothyroxine (SYNTHROID, LEVOTHROID) 150 MCG tablet Take 150 mcg by mouth See admin instructions. Take 1 tablet daily Monday through Friday   Yes Historical Provider, MD  levothyroxine (SYNTHROID, LEVOTHROID) 175 MCG tablet Take 175 mcg by mouth See admin instructions. 1 tablet daily on Saturday and Sunday   Yes Historical Provider, MD  Magnesium 250 MG TABS Take 250 mg by mouth daily.   Yes Historical Provider, MD  Multiple Vitamin (MULTIVITAMIN WITH MINERALS) TABS Take 1 tablet by mouth daily.   Yes Historical Provider, MD  mycophenolate (MYFORTIC) 360  MG TBEC Take 360 mg by mouth 2 (two) times daily.    Yes Historical Provider, MD  omeprazole (PRILOSEC) 20 MG capsule Take 20 mg by mouth every evening.   Yes Historical Provider, MD  Oxybutynin Chloride 10 % GEL Place 2 packets onto the skin every morning.   Yes Historical Provider, MD  predniSONE (DELTASONE) 5 MG tablet Take 5 mg by mouth every morning.    Yes Historical Provider, MD  sertraline (ZOLOFT) 25 MG tablet Take 25 mg by mouth every morning.    Yes Historical Provider, MD  tacrolimus (PROGRAF) 0.5 MG capsule Take 2 mg by mouth every morning. 2mg   every morning and 1mg  every evening   Yes Historical Provider, MD  tacrolimus (PROGRAF) 0.5 MG capsule Take 1 mg by mouth at bedtime.   Yes Historical Provider, MD  warfarin (COUMADIN) 2.5 MG tablet Take 2.5-5 mg by mouth daily. Take 2 tablets daily, except take 1 tablet on Thursdays.   Yes Historical Provider, MD  hyoscyamine (HYOMAX) 0.125  MG tablet Take 1 tablet (0.125 mg total) by mouth every 4 (four) hours as needed for cramping. 11/22/11 12/02/11  Iva Boop, MD    Allergies:   Allergies  Allergen Reactions  . Percocet (Oxycodone-Acetaminophen) Nausea And Vomiting    Social History:  Ambulatory walker  Lives at  Home with husband   reports that she has never smoked. She has never used smokeless tobacco. She reports that she does not drink alcohol or use illicit drugs.   Family History: family history includes Heart attack (age of onset:84) in her father and Stroke (age of onset:73) in her mother.  There is no history of Colon cancer and Diabetes.    Physical Exam: Patient Vitals for the past 24 hrs:  BP Temp Temp src Pulse Resp SpO2 Height Weight  01/27/12 1930 173/60 mmHg - - 56  20  94 % - -  01/27/12 1900 187/64 mmHg - - 53  15  92 % - -  01/27/12 1847 183/65 mmHg - - 57  15  97 % - -  01/27/12 1448 170/57 mmHg - - 65  16  94 % - -  01/27/12 1236 132/78 mmHg - - 55  16  96 % - -  01/27/12 1046 - - - - - - 5\' 4"   (1.626 m) 72.576 kg (160 lb)  01/27/12 1040 124/38 mmHg 98.7 F (37.1 C) Oral 57  20  98 % - -    1. General:  in No Acute distress 2. Psychological: Alert and Oriented 3. Head/ENT:   Moist  Mucous Membranes                          Head Non traumatic, neck supple                          Normal Dentition 4. SKIN:  decreased Skin turgor,  Skin clean Dry and intact no rash 5. Heart: irrular rate and rhythm no Murmur, Rub or gallop 6. Lungs: Clear to auscultation bilaterally, no wheezes or crackles   7. Abdomen: Soft, slightly tender, Non distended diminished bowel sounds 8. Lower extremities: no clubbing, cyanosis, or edema 9. Neurologically Grossly intact, moving all 4 extremities equally 10. MSK: Normal range of motion  body mass index is 27.46 kg/(m^2).   Labs on Admission:   [facility] 01/27/12 1257  NA 134*  K 4.9  CL 98  CO2 29  GLUCOSE 316*  BUN 35*  CREATININE 1.13*  CALCIUM 9.3  MG --  PHOS --    Basename 01/27/12 1257  AST 16  ALT 14  ALKPHOS 50  BILITOT 0.8  PROT 5.4*  ALBUMIN 3.1*    Basename 01/27/12 1257  LIPASE 9*  AMYLASE --    Basename 01/27/12 1257  WBC 8.9  NEUTROABS 7.7  HGB 11.7*  HCT 36.8  MCV 89.5  PLT 134*   No results found for this basename: CKTOTAL:3,CKMB:3,CKMBINDEX:3,TROPONINI:3 in the last 72 hours No results found for this basename: TSH,T4TOTAL,FREET3,T3FREE,THYROIDAB in the last 72 hours No results found for this basename: VITAMINB12:2,FOLATE:2,FERRITIN:2,TIBC:2,IRON:2,RETICCTPCT:2 in the last 72 hours Lab Results  Component Value Date   HGBA1C  Value: 6.5 (NOTE)  According to the ADA Clinical Practice Recommendations for 2011, when HbA1c is used as a screening test:   >=6.5%   Diagnostic of Diabetes Mellitus           (if abnormal result  is confirmed)  5.7-6.4%   Increased risk of developing Diabetes Mellitus  References:Diagnosis and Classification of  Diabetes Mellitus,Diabetes Care,2011,34(Suppl 1):S62-S69 and Standards of Medical Care in         Diabetes - 2011,Diabetes Care,2011,34  (Suppl 1):S11-S61.* 11/03/2010    Estimated Creatinine Clearance: 44 ml/min (by C-G formula based on Cr of 1.13). ABG    Component Value Date/Time   PHART 7.398 11/08/2010 0503   HCO3 26.3* 11/08/2010 0503   TCO2 27.6 11/08/2010 0503   O2SAT 95.0 11/08/2010 0503     No results found for this basename: DDIMER     Other results:    UA no evidence of infection  Cultures:    Component Value Date/Time   SDES BLOOD LEFT HAND 11/08/2010 0925   SPECREQUEST BOTTLES DRAWN AEROBIC ONLY 5CC 11/08/2010 0925   CULT NO GROWTH 5 DAYS 11/08/2010 0925   REPTSTATUS 11/14/2010 FINAL 11/08/2010 0925       Radiological Exams on Admission: Ct Abdomen Pelvis W Contrast  01/27/2012  *RADIOLOGY REPORT*  Clinical Data: Nausea/vomiting/diarrhea, blood in stool, kidney transplant  CT ABDOMEN AND PELVIS WITH CONTRAST  Technique:  Multidetector CT imaging of the abdomen and pelvis was performed following the standard protocol during bolus administration of intravenous contrast.  Contrast:  65 ml Omnipaque 300 IV  Comparison: [place] CT abdomen pelvis dated 12/06/2011, [facility] MRI abdomen dated 07/04/2011  Findings: Mild linear scarring at the left lung base.  Cardiomegaly.  Coronary atherosclerosis with mitral valve annular calcifications.  Small hiatal hernia.  Liver, spleen, and adrenal glands are within normal limits.  Stable cystic pancreatic lesions associated with the pancreatic head/uncinate process (series 2/images 22 and 25), better visualized/characterized on prior MRI.  Status post cholecystectomy.  No intrahepatic ductal dilatation. Stable common bile duct measuring up to 9 mm.  Native renal atrophy.  Left lower quadrant renal transplant with subcentimeter upper pole cysts and a stable extrarenal pelvis without frank hydronephrosis.  No evidence of bowel obstruction.   Mass-like thickening of the cecum (series 2/image 51; coronal image 54), worrisome for colonic adenocarcinoma.  Small pericecal lymph nodes measuring up to 5 mm short axis (series 2/image 48).  Diffuse wall thickening involving a relatively long segment of sigmoid colon (series 2/image 64), suspicious for colitis.  No drainable fluid collection or abscess.  Atherosclerotic calcifications of the abdominal aorta and branch vessels.  Extensive vascular calcifications.  No abdominopelvic ascites.  No suspicious abdominopelvic lymphadenopathy.  Status post hysterectomy.  No adnexal masses.  Bladder is mildly thick-walled.  Mild degenerative changes of the visualized thoracolumbar spine.  IMPRESSION: Mass-like thickening of the cecum, worrisome for colonic adenocarcinoma.  Small pericecal lymph nodes measuring up to 5 mm short axis.  Colonoscopy is suggested for further evaluation.  Diffuse wall thickening involving the sigmoid colon, suspicious for colitis.  Left lower quadrant renal transplant.  Stable extrarenal pelvis without frank hydronephrosis.  Stable cystic pancreatic lesions associated with the pancreatic head/uncinate process, better visualized/characterized on prior MRI.  These results were called by telephone on 01/27/2012  at  1850 hours to  Dr Carleene Cooper, who verbally acknowledged these results.  Original Report Authenticated By: Charline Bills, M.D.   Dg Abd Acute W/chest  01/27/2012  *RADIOLOGY REPORT*  Clinical Data: Nausea,  vomiting, diarrhea, abdominal pain  ACUTE ABDOMEN SERIES (ABDOMEN 2 VIEW & CHEST 1 VIEW)  Comparison: CT abdomen pelvis of 12/06/2011 and abdomen films of 11/11/2011, and chest x-ray of 12/14/2010  Findings: No active infiltrate or effusion is seen.  There is moderate cardiomegaly present.  Considerable calcification of the mitral annulus is noted.  Median sternotomy sutures are present.  Supine and left lower decubitus films of the abdomen were obtained. No bowel obstruction  is seen.  No free air is noted.  Multiple surgical clips are present in the left lower pelvis and abdomen. There is considerable arterial calcification diffusely.  The bones are osteopenic.  IMPRESSION:  1.  Cardiomegaly.  No active lung disease. 2.  No bowel obstruction.  No free air. 3.  Considerable arterial calcification.  Original Report Authenticated By: Juline Patch, M.D.    Assessment/Plan  71 year old female with colitis and newly found cecal mass.  Present on Admission:  .Colitis - etiology unclear at this point but will call her with ciprofloxacin and Flagyl patient is immunocompromised secondary to history renal transplant. Would recommend GI consult. Patient had somewhat bloody bowel movement although there was no overt bleeding for right now will hold off on warfarin which she takes Provigil fibrillation and expectation patient may need colonoscopy in the near future, bowel rest  .SINUS BRADYCARDIA - continue to monitor on telemetry  .DIABETES MELLITUS, TYPE II - continue home medications and sliding scale  .Atrial fibrillation - hold off on Coumadin for now given blood in stool   .Colonic mass - will need GI consult for colonoscopy to obtain biopsy and then we'll proceed from there  .Blood in stool - secondary to colitis please see above will monitor CBC   Prophylaxis: SCD  Protonix  CODE STATUS: Limited code patient does not wish to be intubated or have chest compressions done but do wish to have medications to restart her heart also poor blood pressure. Family is at bedside and agrees to above  I have spent a total of  60 min on this admission  Hosey Burmester 01/27/2012, 8:03 PM

## 2012-01-27 NOTE — ED Notes (Signed)
PT states that she is diabetic and has not checked CBG today. Pt request something to eat

## 2012-01-27 NOTE — ED Notes (Signed)
[place], Prosthetic, and Leg brace sent with pt upstairs.

## 2012-01-27 NOTE — ED Notes (Signed)
Patient with reported onset of n/v/d last night.  Family also reports blood in her stools as well.  Patient has had kidney transplant.  Patient has been dealing with constipation and nausea for a long time,  Since October.  Her nephrologist encouraged her to come to ED

## 2012-01-27 NOTE — ED Notes (Signed)
Transported by Tresa Endo RN on telemetry.

## 2012-01-27 NOTE — ED Provider Notes (Signed)
MSE was initiated and I personally evaluated the patient and placed orders (if any) at  12:46 PM on January 27, 2012. Pt has been having trouble with constipation for a long time.  Pt had spoken with her nephrologist and has been seeing Dr Leone Payor.  Over the years she has taken many medications.   Pt started having trouble with vomiting and diarrhea last night.  She has history of renal transplant.  Multiple episodes of vomiting and diarrhea.  Also with fever to 100. Bloody diarrhea noted.   Labs and xrays started.  The remainder of the MSE may be completed by another ED provider.  Celene Kras, MD 01/27/12 1249

## 2012-01-27 NOTE — ED Notes (Signed)
Admitting at bedside still explaining POC to pt and family. Will continue to monitor.

## 2012-01-27 NOTE — ED Notes (Signed)
Pt done with oral contrast. CT notified.

## 2012-01-27 NOTE — ED Notes (Signed)
CBG resulted :236

## 2012-01-28 DIAGNOSIS — R933 Abnormal findings on diagnostic imaging of other parts of digestive tract: Secondary | ICD-10-CM | POA: Insufficient documentation

## 2012-01-28 DIAGNOSIS — K921 Melena: Secondary | ICD-10-CM

## 2012-01-28 DIAGNOSIS — K639 Disease of intestine, unspecified: Secondary | ICD-10-CM

## 2012-01-28 DIAGNOSIS — R1030 Lower abdominal pain, unspecified: Secondary | ICD-10-CM | POA: Insufficient documentation

## 2012-01-28 DIAGNOSIS — K5289 Other specified noninfective gastroenteritis and colitis: Secondary | ICD-10-CM

## 2012-01-28 DIAGNOSIS — K529 Noninfective gastroenteritis and colitis, unspecified: Secondary | ICD-10-CM

## 2012-01-28 DIAGNOSIS — R112 Nausea with vomiting, unspecified: Secondary | ICD-10-CM | POA: Diagnosis present

## 2012-01-28 DIAGNOSIS — K6389 Other specified diseases of intestine: Secondary | ICD-10-CM

## 2012-01-28 DIAGNOSIS — A09 Infectious gastroenteritis and colitis, unspecified: Principal | ICD-10-CM

## 2012-01-28 DIAGNOSIS — K5900 Constipation, unspecified: Secondary | ICD-10-CM

## 2012-01-28 DIAGNOSIS — K589 Irritable bowel syndrome without diarrhea: Secondary | ICD-10-CM

## 2012-01-28 DIAGNOSIS — R109 Unspecified abdominal pain: Secondary | ICD-10-CM

## 2012-01-28 DIAGNOSIS — E1165 Type 2 diabetes mellitus with hyperglycemia: Secondary | ICD-10-CM

## 2012-01-28 LAB — HEMOGLOBIN A1C: Hgb A1c MFr Bld: 6.3 % — ABNORMAL HIGH (ref <5.7)

## 2012-01-28 LAB — MRSA PCR SCREENING: MRSA by PCR: NEGATIVE

## 2012-01-28 LAB — CBC
Hemoglobin: 10.6 g/dL — ABNORMAL LOW (ref 12.0–15.0)
RBC: 3.69 MIL/uL — ABNORMAL LOW (ref 3.87–5.11)
WBC: 5.6 10*3/uL (ref 4.0–10.5)

## 2012-01-28 LAB — COMPREHENSIVE METABOLIC PANEL
CO2: 28 mEq/L (ref 19–32)
Calcium: 8.8 mg/dL (ref 8.4–10.5)
Creatinine, Ser: 0.91 mg/dL (ref 0.50–1.10)
GFR calc Af Amer: 71 mL/min — ABNORMAL LOW (ref >90)
GFR calc non Af Amer: 62 mL/min — ABNORMAL LOW (ref >90)
Glucose, Bld: 120 mg/dL — ABNORMAL HIGH (ref 70–99)

## 2012-01-28 LAB — GLUCOSE, CAPILLARY
Glucose-Capillary: 120 mg/dL — ABNORMAL HIGH (ref 70–99)
Glucose-Capillary: 162 mg/dL — ABNORMAL HIGH (ref 70–99)
Glucose-Capillary: 97 mg/dL (ref 70–99)

## 2012-01-28 MED ORDER — HYDRALAZINE HCL 20 MG/ML IJ SOLN
10.0000 mg | Freq: Four times a day (QID) | INTRAMUSCULAR | Status: DC | PRN
  Administered 2012-01-28 – 2012-02-01 (×6): 10 mg via INTRAVENOUS
  Filled 2012-01-28 (×4): qty 0.5

## 2012-01-28 MED ORDER — POLYETHYLENE GLYCOL 3350 17 G PO PACK
17.0000 g | PACK | Freq: Three times a day (TID) | ORAL | Status: DC
  Administered 2012-01-28: 17 g via ORAL
  Filled 2012-01-28 (×6): qty 1

## 2012-01-28 MED ORDER — OXYBUTYNIN CHLORIDE 10 % TD GEL
1.0000 | Freq: Every morning | TRANSDERMAL | Status: DC
  Administered 2012-01-29: 1 via TRANSDERMAL
  Administered 2012-01-31: 10 % via TRANSDERMAL
  Administered 2012-02-01: 1 via TRANSDERMAL
  Filled 2012-01-28 (×4): qty 1

## 2012-01-28 MED ORDER — PNEUMOCOCCAL VAC POLYVALENT 25 MCG/0.5ML IJ INJ
0.5000 mL | INJECTION | INTRAMUSCULAR | Status: AC
  Administered 2012-01-29: 0.5 mL via INTRAMUSCULAR
  Filled 2012-01-28: qty 0.5

## 2012-01-28 MED ORDER — HYDRALAZINE HCL 20 MG/ML IJ SOLN
10.0000 mg | Freq: Four times a day (QID) | INTRAMUSCULAR | Status: DC | PRN
  Filled 2012-01-28 (×2): qty 0.5

## 2012-01-28 NOTE — Consult Note (Addendum)
[facility] Consultation  Referring Provider:  Dr. Adela [name], [facility] Primary Care Physician:  Elizabeth Cords, MD, MD Primary Gastroenterologist:  Dr. Leone Payor  Reason for Consultation:  abd pain, abnormal GI imaging, N/V  HPI: Elizabeth [name] is a 71 y.o. female with complicated PMH consisting of but not limited to chronic kidney disease status post renal transplant in 2001, coronary artery disease, diabetes, peripheral vascular disease, hypothyroidism, pancreatic cyst, melanoma, GERD, stroke, adenomatous colon polyps who is admitted to the hospital with ongoing trouble abdominal pain and trouble with bowel movements. Of late she is also noted nausea with vomiting, and recent bloody stools. she reports her last 2 days she has developed nausea with vomiting. Her vomiting have been nonbloody and nonbilious. She also reports developing a bloody loose stools. Last BM was 2 days ago and bloody.  She reports ongoing lower abd pain this morning.  Also severe nausea without vomiting this am.  She reports a fever at home, which was low-grade in the 99-100 range. She has been taking her warfarin for atrial for ablation. No chest pain or dyspnea.   Of note, she has over the last 6 months had issues with primarily constipation, but also at times diarrhea. There multiple phone notes in the medical record which discuss small, ineffective stools. Nausea, and at times right lower quadrant pain. She had multiple attempts at colonic purging with laxatives, often times incomplete. She's tried multiple laxatives including LINZESS, docusate, MiraLAX, bisacodyl, lactulose.   Past Medical History  Diagnosis Date  . Coronary artery disease   . Chronic kidney disease     s/p cadaveric renal transplant in 2001  . Diabetes mellitus   . PVD (peripheral vascular disease)   . History of orthostatic hypotension   . Hyperlipidemia   . Hypothyroidism   . Anemia   . Pancreatic cyst   .  Osteoporosis     recurrent fractures  . Melanoma   . Carotid bruit     left  . Adenomatous polyp   . GERD (gastroesophageal reflux disease)   . Gastritis   . CVA (cerebral infarction)   . Bradycardia   . Renal artery stenosis   . Diabetic retinopathy   . Renal osteodystrophy   . Hyperparathyroidism   . Leg ulcer     from poor fitting prosthetic  . H/O immunosuppressive therapy   . 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 Fx wrist   . Fx ankle   . Neuropathy   . Nephropathy   . Carotid artery stenosis     mild  . Stroke     Past Surgical History  Procedure Date  . Kidney transplant 08/15/2000    [facility] in [place]  . 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 Appendectomy   . Cataract extraction   . Cholecystectomy   . Abdominal hysterectomy   . Thyroid surgery   . Tonsillectomy   . Cesarean section     x 2  . Vitrectomy     right eye  . Coronary artery bypass graft   . Colonoscopy w/ biopsies and polypectomy 02/18/2009    adenomatous polyps, diverticulosis, internal hemorrhoids  . Eus 02/04/2010    w/FNA, pancreatic cyst  . Upper gastrointestinal endoscopy 12/01/2009    Prior to Admission medications   Medication Sig Start Date End Date Taking? Authorizing Provider  atorvastatin (LIPITOR) 10 MG tablet Take 10 mg by mouth  every other day.    Yes Historical Provider, MD  Calcium Carbonate-Vitamin D (CALCIUM 600-D) 600-400 MG-UNIT per tablet Take 1 tablet by mouth daily.    Yes Historical Provider, MD  fludrocortisone (FLORINEF) 0.1 MG tablet Take 0.1 mg by mouth every other day.   Yes Historical Provider, MD  hyoscyamine (LEVSIN, ANASPAZ) 0.125 MG tablet Take 0.125 mg by mouth daily as needed. For bowel pain.   Yes Historical Provider, MD  insulin glargine (LANTUS) 100 UNIT/ML injection Inject 13 Units into the skin every morning.    Yes Historical Provider, MD  insulin lispro (HUMALOG) 100 UNIT/ML injection Inject  2-9 Units into the skin 3 (three) times daily before meals. Per sliding scale.   Yes Historical Provider, MD  levothyroxine (SYNTHROID, LEVOTHROID) 150 MCG tablet Take 150 mcg by mouth See admin instructions. Take 1 tablet daily Monday through Friday   Yes Historical Provider, MD  levothyroxine (SYNTHROID, LEVOTHROID) 175 MCG tablet Take 175 mcg by mouth See admin instructions. 1 tablet daily on Saturday and Sunday   Yes Historical Provider, MD  Magnesium 250 MG TABS Take 250 mg by mouth daily.   Yes Historical Provider, MD  Multiple Vitamin (MULTIVITAMIN WITH MINERALS) TABS Take 1 tablet by mouth daily.   Yes Historical Provider, MD  mycophenolate (MYFORTIC) 360 MG TBEC Take 360 mg by mouth 2 (two) times daily.    Yes Historical Provider, MD  omeprazole (PRILOSEC) 20 MG capsule Take 20 mg by mouth every evening.   Yes Historical Provider, MD  Oxybutynin Chloride 10 % GEL Place 2 packets onto the skin every morning.   Yes Historical Provider, MD  predniSONE (DELTASONE) 5 MG tablet Take 5 mg by mouth every morning.    Yes Historical Provider, MD  sertraline (ZOLOFT) 25 MG tablet Take 25 mg by mouth every morning.    Yes Historical Provider, MD  tacrolimus (PROGRAF) 0.5 MG capsule Take 2 mg by mouth every morning. 2mg   every morning and 1mg  every evening   Yes Historical Provider, MD  tacrolimus (PROGRAF) 0.5 MG capsule Take 1 mg by mouth at bedtime.   Yes Historical Provider, MD  warfarin (COUMADIN) 2.5 MG tablet Take 2.5-5 mg by mouth daily. Take 2 tablets daily, except take 1 tablet on Thursdays.   Yes Historical Provider, MD  hyoscyamine (HYOMAX) 0.125 MG tablet Take 1 tablet (0.125 mg total) by mouth every 4 (four) hours as needed for cramping. 11/22/11 12/02/11  Iva Boop, MD    Current Facility-Administered Medications  Medication Dose Route Frequency Provider Last Rate Last Dose  . 0.9 %  sodium chloride infusion  1,000 mL Intravenous Once Celene Kras, MD 999 mL/hr at 01/27/12 1429 1,000 mL  at 01/27/12 1429   Followed by  . 0.9 %  sodium chloride infusion  1,000 mL Intravenous Continuous Therisa Doyne, MD 75 mL/hr at 01/28/12 0620 1,000 mL at 01/28/12 0620  . acetaminophen (TYLENOL) tablet 650 mg  650 mg Oral Q6H PRN Therisa Doyne, MD       Or  . acetaminophen (TYLENOL) suppository 650 mg  650 mg Rectal Q6H PRN Therisa Doyne, MD      . albuterol (PROVENTIL) (5 MG/ML) 0.5% nebulizer solution 2.5 mg  2.5 mg Nebulization Q2H PRN Therisa Doyne, MD      . atorvastatin (LIPITOR) tablet 10 mg  10 mg Oral QODAY Therisa Doyne, MD      . ciprofloxacin (CIPRO) IVPB 400 mg  400 mg Intravenous Q12H Therisa Doyne, MD  400 mg at 01/28/12 0114  . fludrocortisone (FLORINEF) tablet 0.1 mg  0.1 mg Oral QODAY Therisa Doyne, MD      . guaiFENesin-dextromethorphan (ROBITUSSIN DM) 100-10 MG/5ML syrup 5 mL  5 mL Oral Q4H PRN Therisa Doyne, MD      . hydrALAZINE (APRESOLINE) injection 10 mg  10 mg Intravenous Q6H PRN Jinger Neighbors, NP      . HYDROcodone-acetaminophen (NORCO) 5-325 MG per tablet 1-2 tablet  1-2 tablet Oral Q4H PRN Therisa Doyne, MD      . insulin aspart (novoLOG) injection 0-9 Units  0-9 Units Subcutaneous Q4H Therisa Doyne, MD   2 Units at 01/28/12 0039  . insulin glargine (LANTUS) injection 13 Units  13 Units Subcutaneous BH-q7a Therisa Doyne, MD      . levothyroxine (SYNTHROID, LEVOTHROID) tablet 150 mcg  150 mcg Oral Custom Therisa Doyne, MD      . levothyroxine (SYNTHROID, LEVOTHROID) tablet 175 mcg  175 mcg Oral Custom Therisa Doyne, MD   175 mcg at 01/28/12 0520  . magnesium oxide (MAG-OX) tablet 400 mg  400 mg Oral Daily Therisa Doyne, MD      . metroNIDAZOLE (FLAGYL) IVPB 500 mg  500 mg Intravenous Q8H Therisa Doyne, MD   500 mg at 01/28/12 0601  . mycophenolate (MYFORTIC) EC tablet 360 mg  360 mg Oral BID Therisa Doyne, MD   360 mg at 01/28/12 0026  . ondansetron (ZOFRAN) injection 4 mg  4 mg  Intravenous Once Celene Kras, MD   4 mg at 01/27/12 1429  . ondansetron (ZOFRAN) tablet 4 mg  4 mg Oral Q6H PRN Therisa Doyne, MD       Or  . ondansetron (ZOFRAN) injection 4 mg  4 mg Intravenous Q6H PRN Therisa Doyne, MD   4 mg at 01/28/12 0720  . Oxybutynin Chloride 10 % GEL 2 packet  2 packet Transdermal q morning - 10a Therisa Doyne, MD      . pantoprazole (PROTONIX) EC tablet 40 mg  40 mg Oral Q1200 Therisa Doyne, MD   40 mg at 01/28/12 0039  . predniSONE (DELTASONE) tablet 5 mg  5 mg Oral q morning - 10a Therisa Doyne, MD      . sertraline (ZOLOFT) tablet 25 mg  25 mg Oral q morning - 10a Anastassia Doutova, MD      . sodium chloride 0.9 % injection 3 mL  3 mL Intravenous Q12H Therisa Doyne, MD   3 mL at 01/28/12 0115  . tacrolimus (PROGRAF) capsule 1 mg  1 mg Oral QHS Therisa Doyne, MD   1 mg at 01/28/12 0026  . tacrolimus (PROGRAF) capsule 2 mg  2 mg Oral q morning - 10a Therisa Doyne, MD      . DISCONTD: 0.9 %  sodium chloride infusion  1,000 mL Intravenous Continuous Celene Kras, MD 125 mL/hr at 01/27/12 1525 1,000 mL at 01/27/12 1525  . DISCONTD: iohexol (OMNIPAQUE) 300 MG/ML solution 65 mL  65 mL Intravenous Once PRN Medication Radiologist, MD        Allergies as of 01/27/2012 - Review Complete 01/27/2012  Allergen Reaction Noted  . Percocet (oxycodone-acetaminophen) Nausea And Vomiting 01/27/2012    Family History  Problem Relation Age of Onset  . Stroke Mother 6  . Heart attack Father 57  . Colon cancer Neg Hx   . Diabetes Neg Hx     History   Social History  . Marital Status: Married    Spouse Name: N/A  Number of Children: 2  . Years of Education: N/A   Occupational History  . retired Engineer, civil (consulting)     Review of Systems: As per history of present illness, otherwise negative  Physical Exam: Vital signs in last 24 hours: Temp:  [98 F (36.7 C)-98.9 F (37.2 C)] 98.1 F (36.7 C) (06/08 0530) Pulse Rate:  [51-65] 52   (06/08 0530) Resp:  [15-20] 18  (06/08 0530) BP: (124-195)/(38-89) 155/89 mmHg (06/08 0540) SpO2:  [90 %-98 %] 98 % (06/08 0530) Weight:  [160 lb (72.576 kg)] 160 lb (72.576 kg) (06/07 1046) Last BM Date: 01/26/12 Gen: awake, alert, NAD HEENT: anicteric, op clear CV: RRR, no mrg Pulm: CTA b/l Abd: soft, moderate TTP lower abd and LLQ, ND, hypoactive but present BS, abd bruit noted Ext: left BKA, right LE brace, 1+ edema Neuro: nonfocal   Lab Results:  Basename 01/28/12 0650 01/27/12 1257  WBC 5.6 8.9  HGB 10.6* 11.7*  HCT 32.9* 36.8  PLT 121* 134*   BMET  Basename 01/28/12 0650 01/27/12 1257  NA 139 134*  K 3.9 4.9  CL 104 98  CO2 28 29  GLUCOSE 120* 316*  BUN 21 35*  CREATININE 0.91 1.13*  CALCIUM 8.8 9.3   LFT  Basename 01/28/12 0650  PROT 4.8*  ALBUMIN 2.6*  AST 14  ALT 11  ALKPHOS 41  BILITOT 0.7  BILIDIR --  IBILI --   PT/INR  Basename 01/27/12 1257  LABPROT 28.5*  INR 2.63*   Studies/Results: Ct Abdomen Pelvis W Contrast  01/27/2012  *RADIOLOGY REPORT*  Clinical Data: Nausea/vomiting/diarrhea, blood in stool, kidney transplant  CT ABDOMEN AND PELVIS WITH CONTRAST  Technique:  Multidetector CT imaging of the abdomen and pelvis was performed following the standard protocol during bolus administration of intravenous contrast.  Contrast:  65 ml Omnipaque 300 IV  Comparison: [facility] CT abdomen pelvis dated 12/06/2011, [facility] MRI abdomen dated 07/04/2011  Findings: Mild linear scarring at the left lung base.  Cardiomegaly.  Coronary atherosclerosis with mitral valve annular calcifications.  Small hiatal hernia.  Liver, spleen, and adrenal glands are within normal limits.  Stable cystic pancreatic lesions associated with the pancreatic head/uncinate process (series 2/images 22 and 25), better visualized/characterized on prior MRI.  Status post cholecystectomy.  No intrahepatic ductal dilatation. Stable common bile duct measuring up to 9 mm.  Native renal  atrophy.  Left lower quadrant renal transplant with subcentimeter upper pole cysts and a stable extrarenal pelvis without frank hydronephrosis.  No evidence of bowel obstruction.  Mass-like thickening of the cecum (series 2/image 51; coronal image 54), worrisome for colonic adenocarcinoma.  Small pericecal lymph nodes measuring up to 5 mm short axis (series 2/image 48).  Diffuse wall thickening involving a relatively long segment of sigmoid colon (series 2/image 64), suspicious for colitis.  No drainable fluid collection or abscess.  Atherosclerotic calcifications of the abdominal aorta and branch vessels.  Extensive vascular calcifications.  No abdominopelvic ascites.  No suspicious abdominopelvic lymphadenopathy.  Status post hysterectomy.  No adnexal masses.  Bladder is mildly thick-walled.  Mild degenerative changes of the visualized thoracolumbar spine.  IMPRESSION: Mass-like thickening of the cecum, worrisome for colonic adenocarcinoma.  Small pericecal lymph nodes measuring up to 5 mm short axis.  Colonoscopy is suggested for further evaluation.  Diffuse wall thickening involving the sigmoid colon, suspicious for colitis.  Left lower quadrant renal transplant.  Stable extrarenal pelvis without frank hydronephrosis.  Stable cystic pancreatic lesions associated with the pancreatic head/uncinate process, better  visualized/characterized on prior MRI.  These results were called by telephone on 01/27/2012  at  1850 hours to  Dr Carleene Cooper, who verbally acknowledged these results.  Original Report Authenticated By: Charline Bills, M.D.   Dg Abd Acute W/chest  01/27/2012  *RADIOLOGY REPORT*  Clinical Data: Nausea, vomiting, diarrhea, abdominal pain  ACUTE ABDOMEN SERIES (ABDOMEN 2 VIEW & CHEST 1 VIEW)  Comparison: CT abdomen pelvis of 12/06/2011 and abdomen films of 11/11/2011, and chest x-ray of 12/14/2010  Findings: No active infiltrate or effusion is seen.  There is moderate cardiomegaly present.   Considerable calcification of the mitral annulus is noted.  Median sternotomy sutures are present.  Supine and left lower decubitus films of the abdomen were obtained. No bowel obstruction is seen.  No free air is noted.  Multiple surgical clips are present in the left lower pelvis and abdomen. There is considerable arterial calcification diffusely.  The bones are osteopenic.  IMPRESSION:  1.  Cardiomegaly.  No active lung disease. 2.  No bowel obstruction.  No free air. 3.  Considerable arterial calcification.  Original Report Authenticated By: Juline Patch, M.D.    Previous Endoscopies: Colonoscopy, Dr. Leone Payor, 02/18/2009 -- normal terminal ileum. Moderate diverticulosis. 7 mm sessile descending colon polyp. Otherwise normal. Pathology: Descending colon polyp tubular adenoma, random colon biopsies negative for colitis. EGD, 12/01/2009 -- GEJ inflammation, antral erosion, 1 cm nodule cardia, empiric dilation with 48 Fr Savary.  Pathology: chronic inflammation, HP neg, mod inflammation with dysplasia or metaplasia at GEJ  Impression / Plan:  71 y.o. female with complicated PMH consisting of but not limited to chronic kidney disease status post renal transplant in 2001, coronary artery disease, diabetes, peripheral vascular disease, hypothyroidism, pancreatic cyst, melanoma, GERD, stroke, adenomatous colon polyps who is admitted to the hospital with ongoing trouble abdominal pain, and now N/V with bloody stools.  Abnormal CT showing cecal thickening and possible mass, sigmoid colitis.  1. Abd pain/nausea with vomiting, bloody stools/abnl GI imaging -- complicated picture, with complicated PMH.  She has had alternating bowel habits over the last 6 months.  Her CT abnormalities now, were not seen in April 2013, which in my opinion makes a mass or malignant process in the cecum less likely.  There is also sigmoid thickening with radiographic concern for colitis.  The differential at present is ischemic injury  and possibly infection.  Less likely IBD or malignancy.  I do think she will need colonoscopy, but 1st I want to exclude infection with stool studies.  She is being covered with abx for now (cipro/metronidazole).  She will be difficult to prep for procedure given her severe N/V and constipation requiring multiple laxatives.  Today, I rec antiemetics and pain control as needed.  Stool studies.  I will also check CMV PCR which can cause colitis in immunocompromised patients.  Hold warfarin (this needs to drift downward before colonoscopy as well.  And will start Miralax 17g TID in an effort to begin the prep phase for eventual colonoscopy.  Clear liquids at most from a diet standpt.  Eventually will plan GoLytely over the day before colonoscopy.  Pt understands and is agreeable to this plan.    LOS: 1 day   Eman Rynders M  01/28/2012, 8:37 AM

## 2012-01-28 NOTE — Progress Notes (Signed)
Subjective: No fever; reports some improvement on her abdominal discomfort; still with nausea.  Objective: Vital signs in last 24 hours: Temp:  [98 F (36.7 C)-98.9 F (37.2 C)] 98 F (36.7 C) (06/08 1039) Pulse Rate:  [51-65] 56  (06/08 1039) Resp:  [15-20] 18  (06/08 1039) BP: (132-200)/(57-89) 200/75 mmHg (06/08 1039) SpO2:  [90 %-98 %] 97 % (06/08 1039) Weight change:  Last BM Date: 01/26/12  Intake/Output from previous day: 06/07 0701 - 06/08 0700 In: 445 [I.V.:445] Out: -  Total I/O In: 500 [P.O.:300; IV Piggyback:200] Out: -    Physical Exam: General: Alert, awake, oriented x3, in no acute distress. HEENT: No bruits, no goiter. Heart: Irregular; without murmurs, rubs, gallops. Lungs: Clear to auscultation bilaterally. Abdomen: Soft, slight tenderness with palpation suprapubic area and LLQ; nondistended, positive bowel sounds. Extremities: No clubbing, cyanosis or edema. L BKA. Neuro: Grossly intact, nonfocal.  Lab Results: Basic Metabolic Panel:  Basename 01/28/12 0650 01/27/12 1257  NA 139 134*  K 3.9 4.9  CL 104 98  CO2 28 29  GLUCOSE 120* 316*  BUN 21 35*  CREATININE 0.91 1.13*  CALCIUM 8.8 9.3  MG 1.6 --  PHOS 2.5 --   Liver Function Tests:  [facility] 01/28/12 0650 01/27/12 1257  AST 14 16  ALT 11 14  ALKPHOS 41 50  BILITOT 0.7 0.8  PROT 4.8* 5.4*  ALBUMIN 2.6* 3.1*    Basename 01/27/12 1257  LIPASE 9*  AMYLASE --   CBC:  Basename 01/28/12 0650 01/27/12 1257  WBC 5.6 8.9  NEUTROABS -- 7.7  HGB 10.6* 11.7*  HCT 32.9* 36.8  MCV 89.2 89.5  PLT 121* 134*   CBG:  Basename 01/28/12 1151 01/28/12 0802 01/28/12 0502 01/27/12 1942  GLUCAP 162* 142* 120* 236*   Urinalysis:  Basename 01/27/12 1357  COLORURINE AMBER*  LABSPEC 1.025  PHURINE 5.5  GLUCOSEU 250*  HGBUR NEGATIVE  BILIRUBINUR NEGATIVE  KETONESUR 15*  PROTEINUR NEGATIVE  UROBILINOGEN 0.2  NITRITE NEGATIVE  LEUKOCYTESUR TRACE*   Misc. Labs:  Recent Results (from  the past 240 hour(s))  MRSA PCR SCREENING     Status: Normal   Collection Time   01/27/12 10:55 PM      Component Value Range Status Comment   MRSA by PCR NEGATIVE  NEGATIVE  Final     Studies/Results: Ct Abdomen Pelvis W Contrast  01/27/2012  *RADIOLOGY REPORT*  Clinical Data: Nausea/vomiting/diarrhea, blood in stool, kidney transplant  CT ABDOMEN AND PELVIS WITH CONTRAST  Technique:  Multidetector CT imaging of the abdomen and pelvis was performed following the standard protocol during bolus administration of intravenous contrast.  Contrast:  65 ml Omnipaque 300 IV  Comparison: [place] CT abdomen pelvis dated 12/06/2011, [facility] MRI abdomen dated 07/04/2011  Findings: Mild linear scarring at the left lung base.  Cardiomegaly.  Coronary atherosclerosis with mitral valve annular calcifications.  Small hiatal hernia.  Liver, spleen, and adrenal glands are within normal limits.  Stable cystic pancreatic lesions associated with the pancreatic head/uncinate process (series 2/images 22 and 25), better visualized/characterized on prior MRI.  Status post cholecystectomy.  No intrahepatic ductal dilatation. Stable common bile duct measuring up to 9 mm.  Native renal atrophy.  Left lower quadrant renal transplant with subcentimeter upper pole cysts and a stable extrarenal pelvis without frank hydronephrosis.  No evidence of bowel obstruction.  Mass-like thickening of the cecum (series 2/image 51; coronal image 54), worrisome for colonic adenocarcinoma.  Small pericecal lymph nodes measuring up to 5  mm short axis (series 2/image 48).  Diffuse wall thickening involving a relatively long segment of sigmoid colon (series 2/image 64), suspicious for colitis.  No drainable fluid collection or abscess.  Atherosclerotic calcifications of the abdominal aorta and branch vessels.  Extensive vascular calcifications.  No abdominopelvic ascites.  No suspicious abdominopelvic lymphadenopathy.  Status post hysterectomy.  No  adnexal masses.  Bladder is mildly thick-walled.  Mild degenerative changes of the visualized thoracolumbar spine.  IMPRESSION: Mass-like thickening of the cecum, worrisome for colonic adenocarcinoma.  Small pericecal lymph nodes measuring up to 5 mm short axis.  Colonoscopy is suggested for further evaluation.  Diffuse wall thickening involving the sigmoid colon, suspicious for colitis.  Left lower quadrant renal transplant.  Stable extrarenal pelvis without frank hydronephrosis.  Stable cystic pancreatic lesions associated with the pancreatic head/uncinate process, better visualized/characterized on prior MRI.  These results were called by telephone on 01/27/2012  at  1850 hours to  Dr Carleene Cooper, who verbally acknowledged these results.  Original Report Authenticated By: Charline Bills, M.D.   Dg Abd Acute W/chest  01/27/2012  *RADIOLOGY REPORT*  Clinical Data: Nausea, vomiting, diarrhea, abdominal pain  ACUTE ABDOMEN SERIES (ABDOMEN 2 VIEW & CHEST 1 VIEW)  Comparison: CT abdomen pelvis of 12/06/2011 and abdomen films of 11/11/2011, and chest x-ray of 12/14/2010  Findings: No active infiltrate or effusion is seen.  There is moderate cardiomegaly present.  Considerable calcification of the mitral annulus is noted.  Median sternotomy sutures are present.  Supine and left lower decubitus films of the abdomen were obtained. No bowel obstruction is seen.  No free air is noted.  Multiple surgical clips are present in the left lower pelvis and abdomen. There is considerable arterial calcification diffusely.  The bones are osteopenic.  IMPRESSION:  1.  Cardiomegaly.  No active lung disease. 2.  No bowel obstruction.  No free air. 3.  Considerable arterial calcification.  Original Report Authenticated By: Juline Patch, M.D.    Medications: Scheduled Meds:   . sodium chloride  1,000 mL Intravenous Once  . atorvastatin  10 mg Oral QODAY  . ciprofloxacin  400 mg Intravenous Q12H  . fludrocortisone  0.1 mg  Oral QODAY  . insulin aspart  0-9 Units Subcutaneous Q4H  . insulin glargine  13 Units Subcutaneous BH-q7a  . levothyroxine  150 mcg Oral Custom  . levothyroxine  175 mcg Oral Custom  . magnesium oxide  400 mg Oral Daily  . metronidazole  500 mg Intravenous Q8H  . mycophenolate  360 mg Oral BID  . ondansetron  4 mg Intravenous Once  . Oxybutynin Chloride  2 packet Transdermal q morning - 10a  . pantoprazole  40 mg Oral Q1200  . polyethylene glycol  17 g Oral TID  . predniSONE  5 mg Oral q morning - 10a  . sertraline  25 mg Oral q morning - 10a  . sodium chloride  3 mL Intravenous Q12H  . tacrolimus  1 mg Oral QHS  . tacrolimus  2 mg Oral q morning - 10a   Continuous Infusions:   . sodium chloride 1,000 mL (01/28/12 0620)  . DISCONTD: sodium chloride 1,000 mL (01/27/12 1525)   PRN Meds:.acetaminophen, acetaminophen, albuterol, guaiFENesin-dextromethorphan, hydrALAZINE, HYDROcodone-acetaminophen, ondansetron (ZOFRAN) IV, ondansetron, DISCONTD: hydrALAZINE, DISCONTD: iohexol  Assessment/Plan: 1-Colitis (presumed infectious): will follow stool studies; continue cipro and flagyl and provide supportive care. Bowel rest with CLD. GI has been consulted and the plan is for colonoscopy in the next couple of days.  2-DIABETES MELLITUS, TYPE II: continue SSI.  3-Atrial fibrillation: rate controlled. Coumadin on hold due to GI bleeding.  4-Constipation: probably associated with thyroid dysfunction. Will follow GI rec's and start Miralax TID  5-Colonic mass: had CT abd and pelvis approx 6-8 weeks ago and didn't show any mass. Doubt malignancy; most likely stool impaction due to constipation.  6-Blood in stool: due to ischemic colitis vs infectious colitis. Will continue antibiotics and as per GI recommendation colonoscopy as inpatient.  7-Lower abdominal pain: better today. Continue pain meds and abx's.  8-Nausea and vomiting: will treat with PRN antiemetics.  9-Renal transplant: will  continue prograft and prednisone. Cr WNL.    LOS: 1 day   Lexany Belknap [facility] [phone]  01/28/2012, 12:18 PM

## 2012-01-29 DIAGNOSIS — E1165 Type 2 diabetes mellitus with hyperglycemia: Secondary | ICD-10-CM

## 2012-01-29 DIAGNOSIS — K529 Noninfective gastroenteritis and colitis, unspecified: Secondary | ICD-10-CM

## 2012-01-29 DIAGNOSIS — R933 Abnormal findings on diagnostic imaging of other parts of digestive tract: Secondary | ICD-10-CM

## 2012-01-29 LAB — CBC
HCT: 33.2 % — ABNORMAL LOW (ref 36.0–46.0)
MCH: 29.3 pg (ref 26.0–34.0)
MCV: 90 fL (ref 78.0–100.0)
RBC: 3.69 MIL/uL — ABNORMAL LOW (ref 3.87–5.11)
RDW: 13.6 % (ref 11.5–15.5)
WBC: 5 10*3/uL (ref 4.0–10.5)

## 2012-01-29 LAB — GLUCOSE, CAPILLARY: Glucose-Capillary: 114 mg/dL — ABNORMAL HIGH (ref 70–99)

## 2012-01-29 LAB — BASIC METABOLIC PANEL
BUN: 12 mg/dL (ref 6–23)
CO2: 29 mEq/L (ref 19–32)
Calcium: 8.7 mg/dL (ref 8.4–10.5)
Chloride: 106 mEq/L (ref 96–112)
Creatinine, Ser: 0.84 mg/dL (ref 0.50–1.10)

## 2012-01-29 MED ORDER — PEG 3350-KCL-NA BICARB-NACL 420 G PO SOLR
4000.0000 mL | Freq: Once | ORAL | Status: AC
  Administered 2012-01-29: 4000 mL via ORAL
  Filled 2012-01-29: qty 4000

## 2012-01-29 MED ORDER — INSULIN GLARGINE 100 UNIT/ML ~~LOC~~ SOLN
7.0000 [IU] | Freq: Every day | SUBCUTANEOUS | Status: DC
  Administered 2012-01-31: 7 [IU] via SUBCUTANEOUS

## 2012-01-29 MED ORDER — DEXTROSE 50 % IV SOLN: 25.0000 mL | Freq: Once | INTRAVENOUS | Status: AC | PRN

## 2012-01-29 MED ORDER — DEXTROSE 50 % IV SOLN
INTRAVENOUS | Status: AC
  Administered 2012-01-29: 50 mL
  Filled 2012-01-29: qty 50

## 2012-01-29 MED ORDER — ONDANSETRON HCL 4 MG/2ML IJ SOLN
4.0000 mg | Freq: Four times a day (QID) | INTRAMUSCULAR | Status: AC
  Administered 2012-01-29 – 2012-01-31 (×8): 4 mg via INTRAVENOUS
  Filled 2012-01-29 (×9): qty 2

## 2012-01-29 NOTE — Progress Notes (Signed)
C. Diff Negative.  Contact precautions discontinued. Elizabeth [name]

## 2012-01-29 NOTE — Progress Notes (Signed)
2045 CBG: 60  Treatment: 15 GM carbohydrate snack  Symptoms: Pale  Follow-up CBG: Time:2130 CBG Result: 90  Possible Reasons for Event: Inadequate meal intake   Dahlia Byes Boschen

## 2012-01-29 NOTE — Progress Notes (Signed)
Subjective: No fever; reports abdominal pain is pretty much gone. Patient without further blood in stool. Still with nausea.  Objective: Vital signs in last 24 hours: Temp:  [97.6 F (36.4 C)-100 F (37.8 C)] 98 F (36.7 C) (06/09 0957) Pulse Rate:  [52-61] 52  (06/09 0957) Resp:  [17-20] 18  (06/09 0957) BP: (113-189)/(56-93) 135/65 mmHg (06/09 0957) SpO2:  [97 %-99 %] 99 % (06/09 0957) Weight change:  Last BM Date: 01/29/12  Intake/Output from previous day: 06/08 0701 - 06/09 0700 In: 1550 [P.O.:600; I.V.:700; IV Piggyback:250] Out: -      Physical Exam: General: Alert, awake, oriented x3, in no acute distress. HEENT: No bruits, no goiter. Heart: Irregular; without murmurs, rubs, gallops. Lungs: Clear to auscultation bilaterally. Abdomen: Soft, slight tenderness with palpation suprapubic area and LLQ; nondistended, positive bowel sounds. Extremities: No clubbing, cyanosis or edema. L BKA. Neuro: Grossly intact, nonfocal.  Lab Results: Basic Metabolic Panel:  Basename 01/29/12 0540 01/28/12 0650  NA 139 139  K 3.6 3.9  CL 106 104  CO2 29 28  GLUCOSE 111* 120*  BUN 12 21  CREATININE 0.84 0.91  CALCIUM 8.7 8.8  MG -- 1.6  PHOS -- 2.5   Liver Function Tests:  [facility] 01/28/12 0650 01/27/12 1257  AST 14 16  ALT 11 14  ALKPHOS 41 50  BILITOT 0.7 0.8  PROT 4.8* 5.4*  ALBUMIN 2.6* 3.1*    Basename 01/27/12 1257  LIPASE 9*  AMYLASE --   CBC:  Basename 01/29/12 0540 01/28/12 0650 01/27/12 1257  WBC 5.0 5.6 --  NEUTROABS -- -- 7.7  HGB 10.8* 10.6* --  HCT 33.2* 32.9* --  MCV 90.0 89.2 --  PLT 129* 121* --   CBG:  Basename 01/29/12 0759 01/29/12 0358 01/29/12 0146 01/29/12 0008 01/28/12 2130 01/28/12 2045  GLUCAP 114* 118* 107* 58* 97 60*   Urinalysis:  Basename 01/27/12 1357  COLORURINE AMBER*  LABSPEC 1.025  PHURINE 5.5  GLUCOSEU 250*  HGBUR NEGATIVE  BILIRUBINUR NEGATIVE  KETONESUR 15*  PROTEINUR NEGATIVE  UROBILINOGEN 0.2  NITRITE  NEGATIVE  LEUKOCYTESUR TRACE*   Misc. Labs:  Recent Results (from the past 240 hour(s))  MRSA PCR SCREENING     Status: Normal   Collection Time   01/27/12 10:55 PM      Component Value Range Status Comment   MRSA by PCR NEGATIVE  NEGATIVE  Final   CLOSTRIDIUM DIFFICILE BY PCR     Status: Normal   Collection Time   01/28/12  3:31 PM      Component Value Range Status Comment   C difficile by pcr NEGATIVE  NEGATIVE  Final     Studies/Results: Ct Abdomen Pelvis W Contrast  01/27/2012  *RADIOLOGY REPORT*  Clinical Data: Nausea/vomiting/diarrhea, blood in stool, kidney transplant  CT ABDOMEN AND PELVIS WITH CONTRAST  Technique:  Multidetector CT imaging of the abdomen and pelvis was performed following the standard protocol during bolus administration of intravenous contrast.  Contrast:  65 ml Omnipaque 300 IV  Comparison: [place] CT abdomen pelvis dated 12/06/2011, [facility] MRI abdomen dated 07/04/2011  Findings: Mild linear scarring at the left lung base.  Cardiomegaly.  Coronary atherosclerosis with mitral valve annular calcifications.  Small hiatal hernia.  Liver, spleen, and adrenal glands are within normal limits.  Stable cystic pancreatic lesions associated with the pancreatic head/uncinate process (series 2/images 22 and 25), better visualized/characterized on prior MRI.  Status post cholecystectomy.  No intrahepatic ductal dilatation. Stable common bile duct measuring  up to 9 mm.  Native renal atrophy.  Left lower quadrant renal transplant with subcentimeter upper pole cysts and a stable extrarenal pelvis without frank hydronephrosis.  No evidence of bowel obstruction.  Mass-like thickening of the cecum (series 2/image 51; coronal image 54), worrisome for colonic adenocarcinoma.  Small pericecal lymph nodes measuring up to 5 mm short axis (series 2/image 48).  Diffuse wall thickening involving a relatively long segment of sigmoid colon (series 2/image 64), suspicious for colitis.  No drainable  fluid collection or abscess.  Atherosclerotic calcifications of the abdominal aorta and branch vessels.  Extensive vascular calcifications.  No abdominopelvic ascites.  No suspicious abdominopelvic lymphadenopathy.  Status post hysterectomy.  No adnexal masses.  Bladder is mildly thick-walled.  Mild degenerative changes of the visualized thoracolumbar spine.  IMPRESSION: Mass-like thickening of the cecum, worrisome for colonic adenocarcinoma.  Small pericecal lymph nodes measuring up to 5 mm short axis.  Colonoscopy is suggested for further evaluation.  Diffuse wall thickening involving the sigmoid colon, suspicious for colitis.  Left lower quadrant renal transplant.  Stable extrarenal pelvis without frank hydronephrosis.  Stable cystic pancreatic lesions associated with the pancreatic head/uncinate process, better visualized/characterized on prior MRI.  These results were called by telephone on 01/27/2012  at  1850 hours to  Dr Carleene Cooper, who verbally acknowledged these results.  Original Report Authenticated By: Charline Bills, M.D.   Dg Abd Acute W/chest  01/27/2012  *RADIOLOGY REPORT*  Clinical Data: Nausea, vomiting, diarrhea, abdominal pain  ACUTE ABDOMEN SERIES (ABDOMEN 2 VIEW & CHEST 1 VIEW)  Comparison: CT abdomen pelvis of 12/06/2011 and abdomen films of 11/11/2011, and chest x-ray of 12/14/2010  Findings: No active infiltrate or effusion is seen.  There is moderate cardiomegaly present.  Considerable calcification of the mitral annulus is noted.  Median sternotomy sutures are present.  Supine and left lower decubitus films of the abdomen were obtained. No bowel obstruction is seen.  No free air is noted.  Multiple surgical clips are present in the left lower pelvis and abdomen. There is considerable arterial calcification diffusely.  The bones are osteopenic.  IMPRESSION:  1.  Cardiomegaly.  No active lung disease. 2.  No bowel obstruction.  No free air. 3.  Considerable arterial calcification.   Original Report Authenticated By: Juline Patch, M.D.    Medications: Scheduled Meds:    . atorvastatin  10 mg Oral QODAY  . ciprofloxacin  400 mg Intravenous Q12H  . dextrose      . fludrocortisone  0.1 mg Oral QODAY  . insulin aspart  0-9 Units Subcutaneous Q4H  . insulin glargine  7 Units Subcutaneous Q breakfast  . levothyroxine  150 mcg Oral Custom  . levothyroxine  175 mcg Oral Custom  . magnesium oxide  400 mg Oral Daily  . metronidazole  500 mg Intravenous Q8H  . mycophenolate  360 mg Oral BID  . Oxybutynin Chloride  1 packet Transdermal q morning - 10a  . pantoprazole  40 mg Oral Q1200  . pneumococcal 23 valent vaccine  0.5 mL Intramuscular Tomorrow-1000  . polyethylene glycol  17 g Oral TID  . predniSONE  5 mg Oral q morning - 10a  . sertraline  25 mg Oral q morning - 10a  . sodium chloride  3 mL Intravenous Q12H  . tacrolimus  1 mg Oral QHS  . tacrolimus  2 mg Oral q morning - 10a  . DISCONTD: insulin glargine  13 Units Subcutaneous BH-q7a  . DISCONTD: Oxybutynin Chloride  2 packet Transdermal q morning - 10a   Continuous Infusions:    . sodium chloride 1,000 mL (01/28/12 1816)   PRN Meds:.acetaminophen, acetaminophen, albuterol, dextrose, guaiFENesin-dextromethorphan, hydrALAZINE, HYDROcodone-acetaminophen, ondansetron (ZOFRAN) IV, ondansetron, DISCONTD: hydrALAZINE  Assessment/Plan: 1-Colitis (presumed infectious) vs ischemic colitis: c. Diff negative; will follow rest of pending stool studies; continue cipro and flagyl and supportive care. Continue clear liquid diet. GI has been consulted and the plan is for colonoscopy in the next couple of days; will follow their rec's.  2-DIABETES MELLITUS, TYPE II: with mild hypoglycemic episode due to decrease PO intake. Will lower lantus to 7 units QHS and continue SSI.Marland Kitchen  3-Atrial fibrillation: rate controlled. Coumadin on hold due to GI bleeding.  4-Constipation: probably associated with thyroid dysfunction. Will  follow GI rec's and continue Miralax TID  5-Colonic mass: had CT abd and pelvis approx 6-8 weeks ago and didn't show any mass. Doubt malignancy; most likely stool impaction due to constipation; will follow colonoscopy findings.  6-Blood in stool: due to ischemic colitis vs infectious colitis. Will continue antibiotics and as per GI recommendation colonoscopy as inpatient.  7-Lower abdominal pain: better today. Continue pain meds and abx's.  8-Nausea and vomiting: will continue PRN antiemetics.  9-Renal transplant: will continue prograft and prednisone. Cr WNL.    LOS: 2 days   Jaylaa Gallion [facility] [phone]  01/29/2012, 11:28 AM

## 2012-01-29 NOTE — Progress Notes (Signed)
2022 CBG: 60 Treatment: 15 GM carbohydrate snack  Symptoms: Pale  Follow-up CBG: Time:2045 CBG Result:60  Possible Reasons for Event: Inadequate meal intake  Comments Will continue treatment to improve CBG    Rolinda Roan, Dulce Sellar

## 2012-01-29 NOTE — Progress Notes (Signed)
2045 Pt had episode of Bigeminy, Trigeminy and HR of 28 and 30.  Pt having BM at the time and asymptomatic.  Contacted M. Lynch to make aware.  No orders given.  Will continue to monitor. Elizabeth [name]

## 2012-01-29 NOTE — Progress Notes (Signed)
[facility] Progress Note  Subjective: Still with moderate to severe nausea. No vomiting this morning. She tolerated the MiraLAX 3 times yesterday, and she did have several episodes of diarrhea. No fevers. Abdominal pain is some better today, but not completely resolved. Pain is worse in the left lower quad  Objective:  Vital signs in last 24 hours: Temp:  [97.6 F (36.4 C)-100 F (37.8 C)] 98 F (36.7 C) (06/09 0957) Pulse Rate:  [52-61] 52  (06/09 0957) Resp:  [17-20] 18  (06/09 0957) BP: (113-189)/(56-93) 135/65 mmHg (06/09 0957) SpO2:  [97 %-99 %] 99 % (06/09 0957) Last BM Date: 01/29/12 Gen: awake, alert, NAD  HEENT: anicteric, op clear  CV: RRR, no mrg  Pulm: CTA b/l  Abd: soft, moderate, but improved TTP lower abd and LLQ, ND, hypoactive but present BS, abd bruit noted  Ext: left BKA, right LE brace, 1+ edema  Neuro: nonfocal  Lab Results:  Basename 01/29/12 0540 01/28/12 0650 01/27/12 1257  WBC 5.0 5.6 8.9  HGB 10.8* 10.6* 11.7*  HCT 33.2* 32.9* 36.8  PLT 129* 121* 134*   BMET  Basename 01/29/12 0540 01/28/12 0650 01/27/12 1257  NA 139 139 134*  K 3.6 3.9 4.9  CL 106 104 98  CO2 29 28 29   GLUCOSE 111* 120* 316*  BUN 12 21 35*  CREATININE 0.84 0.91 1.13*  CALCIUM 8.7 8.8 9.3   LFT  Basename 01/28/12 0650  PROT 4.8*  ALBUMIN 2.6*  AST 14  ALT 11  ALKPHOS 41  BILITOT 0.7  BILIDIR --  IBILI --   PT/INR  Basename 01/27/12 1257  LABPROT 28.5*  INR 2.63*   Studies/Results: Ct Abdomen Pelvis W Contrast  01/27/2012  *RADIOLOGY REPORT*  Clinical Data: Nausea/vomiting/diarrhea, blood in stool, kidney transplant  CT ABDOMEN AND PELVIS WITH CONTRAST  Technique:  Multidetector CT imaging of the abdomen and pelvis was performed following the standard protocol during bolus administration of intravenous contrast.  Contrast:  65 ml Omnipaque 300 IV  Comparison: [place] CT abdomen pelvis dated 12/06/2011, [facility] MRI abdomen dated 07/04/2011   Findings: Mild linear scarring at the left lung base.  Cardiomegaly.  Coronary atherosclerosis with mitral valve annular calcifications.  Small hiatal hernia.  Liver, spleen, and adrenal glands are within normal limits.  Stable cystic pancreatic lesions associated with the pancreatic head/uncinate process (series 2/images 22 and 25), better visualized/characterized on prior MRI.  Status post cholecystectomy.  No intrahepatic ductal dilatation. Stable common bile duct measuring up to 9 mm.  Native renal atrophy.  Left lower quadrant renal transplant with subcentimeter upper pole cysts and a stable extrarenal pelvis without frank hydronephrosis.  No evidence of bowel obstruction.  Mass-like thickening of the cecum (series 2/image 51; coronal image 54), worrisome for colonic adenocarcinoma.  Small pericecal lymph nodes measuring up to 5 mm short axis (series 2/image 48).  Diffuse wall thickening involving a relatively long segment of sigmoid colon (series 2/image 64), suspicious for colitis.  No drainable fluid collection or abscess.  Atherosclerotic calcifications of the abdominal aorta and branch vessels.  Extensive vascular calcifications.  No abdominopelvic ascites.  No suspicious abdominopelvic lymphadenopathy.  Status post hysterectomy.  No adnexal masses.  Bladder is mildly thick-walled.  Mild degenerative changes of the visualized thoracolumbar spine.  IMPRESSION: Mass-like thickening of the cecum, worrisome for colonic adenocarcinoma.  Small pericecal lymph nodes measuring up to 5 mm short axis.  Colonoscopy is suggested for further evaluation.  Diffuse wall thickening involving the sigmoid colon,  suspicious for colitis.  Left lower quadrant renal transplant.  Stable extrarenal pelvis without frank hydronephrosis.  Stable cystic pancreatic lesions associated with the pancreatic head/uncinate process, better visualized/characterized on prior MRI.  These results were called by telephone on 01/27/2012  at  1850  hours to  Dr Carleene Cooper, who verbally acknowledged these results.  Original Report Authenticated By: Charline Bills, M.D.   Dg Abd Acute W/chest  01/27/2012  *RADIOLOGY REPORT*  Clinical Data: Nausea, vomiting, diarrhea, abdominal pain  ACUTE ABDOMEN SERIES (ABDOMEN 2 VIEW & CHEST 1 VIEW)  Comparison: CT abdomen pelvis of 12/06/2011 and abdomen films of 11/11/2011, and chest x-ray of 12/14/2010  Findings: No active infiltrate or effusion is seen.  There is moderate cardiomegaly present.  Considerable calcification of the mitral annulus is noted.  Median sternotomy sutures are present.  Supine and left lower decubitus films of the abdomen were obtained. No bowel obstruction is seen.  No free air is noted.  Multiple surgical clips are present in the left lower pelvis and abdomen. There is considerable arterial calcification diffusely.  The bones are osteopenic.  IMPRESSION:  1.  Cardiomegaly.  No active lung disease. 2.  No bowel obstruction.  No free air. 3.  Considerable arterial calcification.  Original Report Authenticated By: Juline Patch, M.D.    Assessment / Plan: 71 y.o. female with complicated PMH consisting of but not limited to chronic kidney disease status post renal transplant in 2001, coronary artery disease, diabetes, peripheral vascular disease, hypothyroidism, pancreatic cyst, melanoma, GERD, stroke, adenomatous colon polyps who is admitted to the hospital with ongoing trouble abdominal pain, and now N/V with bloody stools. Abnormal CT showing cecal thickening and possible mass, sigmoid colitis.   1. Abd pain/nausea with vomiting, bloody stools/abnl GI imaging -- she continues to have significant nausea, and I will schedule her Zofran to try to combat this. Ultimately, I will like to perform colonoscopy to better evaluate abnormality seen by CT scan. I will attempt to begin her colonoscopy prep with GoLYTELY today.she can drink this over the course of the day, but if she does not  tolerate it, then we can back off and attempt colonoscopy prep again tomorrow. C. difficile PCR was negative, other stool studies remain pending, including CMV PCR (given her immunocompromise state). She is on empiric antibiotics.   Check INR tomorrow.  Warfarin on hold, continue to hold.  Principal Problem:  *Colitis Active Problems:  DIABETES MELLITUS, TYPE II  SINUS BRADYCARDIA  Atrial fibrillation  Encounter for long-term (current) use of anticoagulants  Constipation  Colonic mass  Blood in stool  Lower abdominal pain  Bloody stool  Nausea and vomiting  Abnormal finding on GI tract imaging     LOS: 2 days   Corbett Moulder M  01/29/2012, 12:05 PM

## 2012-01-30 ENCOUNTER — Encounter (HOSPITAL_COMMUNITY)
Admission: EM | Disposition: A | Discharge status: Home / Self Care | Payer: Self-pay | Source: Ambulatory Visit | Attending: Internal Medicine

## 2012-01-30 ENCOUNTER — Encounter (HOSPITAL_COMMUNITY): Payer: Self-pay | Admitting: *Deleted

## 2012-01-30 DIAGNOSIS — E1165 Type 2 diabetes mellitus with hyperglycemia: Secondary | ICD-10-CM

## 2012-01-30 DIAGNOSIS — IMO0001 Reserved for inherently not codable concepts without codable children: Secondary | ICD-10-CM

## 2012-01-30 DIAGNOSIS — K529 Noninfective gastroenteritis and colitis, unspecified: Secondary | ICD-10-CM

## 2012-01-30 DIAGNOSIS — D126 Benign neoplasm of colon, unspecified: Secondary | ICD-10-CM

## 2012-01-30 DIAGNOSIS — R933 Abnormal findings on diagnostic imaging of other parts of digestive tract: Secondary | ICD-10-CM

## 2012-01-30 HISTORY — PX: COLONOSCOPY: SHX5424

## 2012-01-30 LAB — CBC
HCT: 33.8 % — ABNORMAL LOW (ref 36.0–46.0)
MCH: 28.7 pg (ref 26.0–34.0)
MCV: 88.3 fL (ref 78.0–100.0)
RDW: 13.4 % (ref 11.5–15.5)
WBC: 5.4 10*3/uL (ref 4.0–10.5)

## 2012-01-30 LAB — GLUCOSE, CAPILLARY
Glucose-Capillary: 109 mg/dL — ABNORMAL HIGH (ref 70–99)
Glucose-Capillary: 108 mg/dL — ABNORMAL HIGH (ref 70–99)
Glucose-Capillary: 137 mg/dL — ABNORMAL HIGH (ref 70–99)
Glucose-Capillary: 172 mg/dL — ABNORMAL HIGH (ref 70–99)
Glucose-Capillary: 259 mg/dL — ABNORMAL HIGH (ref 70–99)

## 2012-01-30 LAB — BASIC METABOLIC PANEL
BUN: 7 mg/dL (ref 6–23)
CO2: 26 mEq/L (ref 19–32)
Calcium: 8.5 mg/dL (ref 8.4–10.5)
Glucose, Bld: 104 mg/dL — ABNORMAL HIGH (ref 70–99)
Sodium: 138 mEq/L (ref 135–145)

## 2012-01-30 SURGERY — COLONOSCOPY
Anesthesia: Moderate Sedation

## 2012-01-30 MED ORDER — FENTANYL NICU IV SYRINGE 50 MCG/ML
INJECTION | INTRAMUSCULAR | Status: DC | PRN
  Administered 2012-01-30 (×3): 25 ug via INTRAVENOUS

## 2012-01-30 MED ORDER — FENTANYL CITRATE 0.05 MG/ML IJ SOLN
INTRAMUSCULAR | Status: AC
  Filled 2012-01-30: qty 2

## 2012-01-30 MED ORDER — MIDAZOLAM HCL 10 MG/2ML IJ SOLN
INTRAMUSCULAR | Status: DC | PRN
  Administered 2012-01-30: 2 mg via INTRAVENOUS
  Administered 2012-01-30 (×3): 1 mg via INTRAVENOUS

## 2012-01-30 MED ORDER — METOCLOPRAMIDE HCL 5 MG PO TABS
5.0000 mg | ORAL_TABLET | Freq: Three times a day (TID) | ORAL | Status: DC
  Administered 2012-01-30 – 2012-02-01 (×6): 5 mg via ORAL
  Filled 2012-01-30 (×8): qty 1

## 2012-01-30 MED ORDER — MIDAZOLAM HCL 10 MG/2ML IJ SOLN
INTRAMUSCULAR | Status: AC
  Filled 2012-01-30: qty 2

## 2012-01-30 NOTE — Progress Notes (Signed)
0602 BP 181/75 Hydralazine 10 mg IV given, Will recheck BP. Dahlia Byes Boschen

## 2012-01-30 NOTE — Progress Notes (Signed)
Subjective: No fever; reports abdominal pain is improved. Patient was lethargic after colonoscopy, but appropriate. Patient without further blood in stool. Still with nausea.  Objective: Vital signs in last 24 hours: Temp:  [97.5 F (36.4 C)-98.1 F (36.7 C)] 97.9 F (36.6 C) (06/10 1400) Pulse Rate:  [49-58] 58  (06/10 1400) Resp:  [14-25] 18  (06/10 1400) BP: (114-213)/(40-82) 172/74 mmHg (06/10 1400) SpO2:  [96 %-100 %] 99 % (06/10 1400) Weight change:  Last BM Date: 01/29/12  Intake/Output from previous day: 06/09 0701 - 06/10 0700 In: 1130 [P.O.:480; I.V.:400; IV Piggyback:250] Out: -      Physical Exam: General: Alert, awake, oriented x3, in no acute distress. HEENT: No bruits, no goiter. Heart: Irregular; without murmurs, rubs, gallops. Lungs: Clear to auscultation bilaterally. Abdomen: Soft, slight tenderness with palpation suprapubic area and LLQ; nondistended, positive bowel sounds. Extremities: No clubbing, cyanosis or edema. L BKA. Neuro: Grossly intact, nonfocal.  Lab Results: Basic Metabolic Panel:  Basename 01/30/12 0445 01/29/12 0540 01/28/12 0650  NA 138 139 --  K 3.5 3.6 --  CL 104 106 --  CO2 26 29 --  GLUCOSE 104* 111* --  BUN 7 12 --  CREATININE 0.77 0.84 --  CALCIUM 8.5 8.7 --  MG -- -- 1.6  PHOS -- -- 2.5   Liver Function Tests:  [facility] 01/28/12 0650  AST 14  ALT 11  ALKPHOS 41  BILITOT 0.7  PROT 4.8*  ALBUMIN 2.6*   CBC:  Basename 01/30/12 0445 01/29/12 0540  WBC 5.4 5.0  NEUTROABS -- --  HGB 11.0* 10.8*  HCT 33.8* 33.2*  MCV 88.3 90.0  PLT 130* 129*   CBG:  Basename 01/30/12 1246 01/30/12 0803 01/30/12 0527 01/29/12 2351 01/29/12 2042 01/29/12 1628  GLUCAP 120* 137* 108* 109* 132* 149*   Misc. Labs:  Recent Results (from the past 240 hour(s))  MRSA PCR SCREENING     Status: Normal   Collection Time   01/27/12 10:55 PM      Component Value Range Status Comment   MRSA by PCR NEGATIVE  NEGATIVE  Final   CLOSTRIDIUM  DIFFICILE BY PCR     Status: Normal   Collection Time   01/28/12  3:31 PM      Component Value Range Status Comment   C difficile by pcr NEGATIVE  NEGATIVE  Final     Studies/Results: No results found.  Medications: Scheduled Meds:    . atorvastatin  10 mg Oral QODAY  . ciprofloxacin  400 mg Intravenous Q12H  . fludrocortisone  0.1 mg Oral QODAY  . insulin aspart  0-9 Units Subcutaneous Q4H  . insulin glargine  7 Units Subcutaneous Q breakfast  . levothyroxine  150 mcg Oral Custom  . levothyroxine  175 mcg Oral Custom  . magnesium oxide  400 mg Oral Daily  . metronidazole  500 mg Intravenous Q8H  . mycophenolate  360 mg Oral BID  . ondansetron  4 mg Intravenous Q6H  . Oxybutynin Chloride  1 packet Transdermal q morning - 10a  . pantoprazole  40 mg Oral Q1200  . pneumococcal 23 valent vaccine  0.5 mL Intramuscular Tomorrow-1000  . polyethylene glycol-electrolytes  4,000 mL Oral Once  . predniSONE  5 mg Oral q morning - 10a  . sertraline  25 mg Oral q morning - 10a  . sodium chloride  3 mL Intravenous Q12H  . tacrolimus  1 mg Oral QHS  . tacrolimus  2 mg Oral q morning - 10a  Continuous Infusions:    . sodium chloride 1,000 mL (01/28/12 1816)   PRN Meds:.acetaminophen, acetaminophen, albuterol, dextrose, guaiFENesin-dextromethorphan, hydrALAZINE, HYDROcodone-acetaminophen, DISCONTD: fentaNYL, DISCONTD: midazolam  Assessment/Plan: 1-Colitis (presumed infectious) vs ischemic colitis: C. Diff negative; Patient status post colonoscopy, findings suggesting infection vs IBD. Will continue antibiotics and follow  rest of pending stool studies; continue cipro and flagyl and supportive care. Will follow biopsies results from colonoscopy. Will advance to heart healthy low carb diet.  2-DIABETES MELLITUS, TYPE II: No further hypoglycemia. Will continue SSI and lantus at current dose; further adjustments after diet advance, depending how sugar fluctutates.  3-Atrial fibrillation: rate  controlled. Coumadin on hold due to GI bleeding. Rate control.  4-Constipation: probably associated with thyroid dysfunction. Continue Miralax TID  5-Colonic mass: had CT abd and pelvis approx 6-8 weeks ago and didn't show any mass. Doubt malignancy. No mass reported on colonoscopy. Will follow results of many biopsies taken during colonoscopy and also of a polyp that was removed.  6-Blood in stool: will follow further GI recommendations; continue holding coumadin. No further bleeding appreciated. Hgb 11.2  7-Lower abdominal pain: better today. Continue pain meds and abx's.  8-Nausea and vomiting: will continue PRN antiemetics.  9-Renal transplant: will continue prograft and prednisone. Cr WNL.    LOS: 3 days   Rayla Pember [facility] [phone]  01/30/2012, 2:16 PM

## 2012-01-30 NOTE — Interval H&P Note (Signed)
History and Physical Interval Note:  01/30/2012 10:28 AM  Elizabeth [name]  has presented today for surgery, with the diagnosis of abnormal GI imaging, bloody stools  The various methods of treatment have been discussed with the patient and family. After consideration of risks, benefits and other options for treatment, the patient has consented to  Procedure(s) (LRB): COLONOSCOPY (N/A) as a surgical intervention .  The patients' history has been reviewed, patient examined, no change in status, stable for surgery.  I have reviewed the patients' chart and labs.  Questions were answered to the patient's satisfaction.     Rob Bunting

## 2012-01-30 NOTE — Progress Notes (Signed)
0726 BP recheck 119/69. Elizabeth [name]

## 2012-01-30 NOTE — Op Note (Signed)
[facility] [address] [place], [place]  [address]  COLONOSCOPY PROCEDURE REPORT  PATIENT:  Elizabeth [name], Elizabeth [name]  MR#:  [id number] BIRTHDATE:  05/27/1941, 70 yrs. old  GENDER:  female ENDOSCOPIST:  Rachael Fee, MD PROCEDURE DATE:  01/30/2012 PROCEDURE:  Colonoscopy with biopsy and snare polypectomy ASA CLASS:  Class III INDICATIONS:  recent constipation; colonoscopy 2010 with Dr. Leone Payor; CT at admission suggested left sided colitis and "mass in cecum" MEDICATIONS:   Fentanyl 75 mcg IV, Versed 5 mg IV  DESCRIPTION OF PROCEDURE:   After the risks benefits and alternatives of the procedure were thoroughly explained, informed consent was obtained.  Digital rectal exam was performed and revealed no rectal masses.   The Pentax Colonoscope [id number] and [phone] endoscope was introduced through the anus and advanced to the cecum, which was identified by both the appendix and ileocecal valve, without limitations.  The quality of the prep was fair..  The instrument was then slowly withdrawn as the colon was fully examined. <<PROCEDUREIMAGES>>  FINDINGS:  A sessile polyp was found in the ascending colon. This was 7mm across, removed with cold snare and sent to path (jar 1) (see ).  IMAGE00  Moderate diverticulosis was found in the sigmoid to descending colon segments (see ).  IMAGE00  The colon was mild to moderately inflamed in sigmoid to descending colon for about 30cm (60 to 30cm from anus). The inflammation was circumferential. This was not classic appearing for ischemia. Multiple biopsies taken and sent to pathology (see , image008, and image002). IMAGE00  This was otherwise a normal examination of the colon (see , image003, and image004).  IMAGE00   Retroflexed views in the rectum revealed no abnormalities. COMPLICATIONS:  None ENDOSCOPIC IMPRESSION: 1) Sessile polyp in the ascending colon, typical appearing adenoma, removed and sent to  pathology 2) Moderate diverticulosis in the sigmoid to descending colon segments 3) Inflammation in the sigmoid to descending colon segments; this did not appear classic for ischemia or diverticular related inflammaton.  Multiple biopsies taken. There was a distinct odor, similar to C. difficile infection however no pseudomembranes were noted. 4) Otherwise normal examination  RECOMMENDATIONS: Will order C. diff testing by PCR. Await final biopsies from segmental colitis and ascending colon polyp (Crhonic ishemia, unusual C. diff, IBD, diverticular related inflammation?)  ______________________________ Rachael Fee, MD  n. eSIGNED:   Rachael Fee at 01/30/2012 11:51 AM  Missy Sabins, Leta Jungling, [phone]

## 2012-01-31 ENCOUNTER — Encounter (HOSPITAL_COMMUNITY): Payer: Self-pay | Admitting: Gastroenterology

## 2012-01-31 DIAGNOSIS — R933 Abnormal findings on diagnostic imaging of other parts of digestive tract: Secondary | ICD-10-CM

## 2012-01-31 DIAGNOSIS — E1165 Type 2 diabetes mellitus with hyperglycemia: Secondary | ICD-10-CM

## 2012-01-31 DIAGNOSIS — K529 Noninfective gastroenteritis and colitis, unspecified: Secondary | ICD-10-CM

## 2012-01-31 LAB — GLUCOSE, CAPILLARY
Glucose-Capillary: 175 mg/dL — ABNORMAL HIGH (ref 70–99)
Glucose-Capillary: 204 mg/dL — ABNORMAL HIGH (ref 70–99)
Glucose-Capillary: 172 mg/dL — ABNORMAL HIGH (ref 70–99)

## 2012-01-31 MED ORDER — INSULIN GLARGINE 100 UNIT/ML ~~LOC~~ SOLN
13.0000 [IU] | Freq: Every day | SUBCUTANEOUS | Status: DC
  Administered 2012-02-01: 13 [IU] via SUBCUTANEOUS

## 2012-01-31 MED ORDER — METRONIDAZOLE 500 MG PO TABS
500.0000 mg | ORAL_TABLET | Freq: Three times a day (TID) | ORAL | Status: DC
  Administered 2012-01-31 – 2012-02-01 (×3): 500 mg via ORAL
  Filled 2012-01-31 (×6): qty 1

## 2012-01-31 MED ORDER — SODIUM CHLORIDE 0.9 % IV SOLN
1000.0000 mL | INTRAVENOUS | Status: DC
  Administered 2012-01-31: 1000 mL via INTRAVENOUS

## 2012-01-31 MED ORDER — CIPROFLOXACIN HCL 500 MG PO TABS
500.0000 mg | ORAL_TABLET | Freq: Two times a day (BID) | ORAL | Status: DC
  Administered 2012-01-31 – 2012-02-01 (×2): 500 mg via ORAL
  Filled 2012-01-31 (×4): qty 1

## 2012-01-31 NOTE — Progress Notes (Signed)
Subjective: No fever; reports no abdominal pain. Patient still with some nausea but no vomiting; Patient without further blood in her stool.   Objective: Vital signs in last 24 hours: Temp:  [97.7 F (36.5 C)-98 F (36.7 C)] 97.7 F (36.5 C) (06/11 1412) Pulse Rate:  [53-58] 56  (06/11 1412) Resp:  [18-20] 20  (06/11 1412) BP: (121-192)/(61-73) 122/63 mmHg (06/11 1412) SpO2:  [94 %-98 %] 94 % (06/11 1412) Weight change:  Last BM Date: 01/30/12  Intake/Output from previous day: 06/10 0701 - 06/11 0700 In: 2272.5 [P.O.:120; I.V.:2152.5] Out: -  Total I/O In: 480 [P.O.:480] Out: -    Physical Exam: General: Alert, awake, oriented x3, in no acute distress. HEENT: No bruits, no goiter. Heart: Irregular; without murmurs, rubs, gallops. Lungs: Clear to auscultation bilaterally. Abdomen: Soft, slight tenderness with palpation suprapubic area and LLQ; nondistended, positive bowel sounds. Extremities: No clubbing, cyanosis or edema. L BKA. Right leg with hard splint in place. Neuro: Grossly intact, nonfocal.  Lab Results: Basic Metabolic Panel:  Basename 01/30/12 0445 01/29/12 0540  NA 138 139  K 3.5 3.6  CL 104 106  CO2 26 29  GLUCOSE 104* 111*  BUN 7 12  CREATININE 0.77 0.84  CALCIUM 8.5 8.7  MG -- --  PHOS -- --   CBC:  Basename 01/30/12 0445 01/29/12 0540  WBC 5.4 5.0  NEUTROABS -- --  HGB 11.0* 10.8*  HCT 33.8* 33.2*  MCV 88.3 90.0  PLT 130* 129*   CBG:  Basename 01/31/12 1157 01/31/12 0823 01/31/12 0426 01/31/12 0020 01/30/12 2040 01/30/12 1656  GLUCAP 204* 120* 175* 249* 259* 172*   Misc. Labs:  Recent Results (from the past 240 hour(s))  MRSA PCR SCREENING     Status: Normal   Collection Time   01/27/12 10:55 PM      Component Value Range Status Comment   MRSA by PCR NEGATIVE  NEGATIVE  Final   CLOSTRIDIUM DIFFICILE BY PCR     Status: Normal   Collection Time   01/28/12  3:31 PM      Component Value Range Status Comment   C difficile by pcr  NEGATIVE  NEGATIVE  Final     Studies/Results: No results found.  Medications: Scheduled Meds:    . atorvastatin  10 mg Oral QODAY  . ciprofloxacin  400 mg Intravenous Q12H  . fludrocortisone  0.1 mg Oral QODAY  . insulin aspart  0-9 Units Subcutaneous Q4H  . insulin glargine  13 Units Subcutaneous Q breakfast  . levothyroxine  150 mcg Oral Custom  . levothyroxine  175 mcg Oral Custom  . magnesium oxide  400 mg Oral Daily  . metoCLOPramide  5 mg Oral TID AC  . metronidazole  500 mg Intravenous Q8H  . mycophenolate  360 mg Oral BID  . ondansetron  4 mg Intravenous Q6H  . Oxybutynin Chloride  1 packet Transdermal q morning - 10a  . pantoprazole  40 mg Oral Q1200  . predniSONE  5 mg Oral q morning - 10a  . sertraline  25 mg Oral q morning - 10a  . sodium chloride  3 mL Intravenous Q12H  . tacrolimus  1 mg Oral QHS  . tacrolimus  2 mg Oral q morning - 10a  . DISCONTD: insulin glargine  7 Units Subcutaneous Q breakfast   Continuous Infusions:    . sodium chloride    . DISCONTD: sodium chloride 1,000 mL (01/31/12 1101)   PRN Meds:.acetaminophen, acetaminophen, albuterol, guaiFENesin-dextromethorphan, hydrALAZINE,  HYDROcodone-acetaminophen  Assessment/Plan: 1-Colitis (presumed infectious) vs ischemic colitis: C. Diff negative; Patient status post colonoscopy, findings suggesting infection vs IBD. Will continue antibiotics but now that she is no longer vomiting will transition to PO; will follow pathology results and follow  rest of pending stool studies. Will continue supportive care. Tolerating diet.  2-DIABETES MELLITUS, TYPE II: No further hypoglycemia. Will resume lantus home dose and continue SSI. A1C 6.3  3-Atrial fibrillation: rate controlled. Coumadin on hold due to GI bleeding; since there has not been further bleeding will plan to resume coumadin in 7-10 days; unless otherwise recommended by GI. Not bridging therapy with lovenox needed.  4-Constipation: probably  associated with thyroid dysfunction. Continue Miralax TID  5-Colonic mass: had CT abd and pelvis approx 6-8 weeks ago and didn't show any mass. Doubt malignancy. No mass reported on colonoscopy. Will follow results of many biopsies taken during colonoscopy and also of a polyp that was removed.  6-Blood in stool: will follow further GI recommendations; continue holding coumadin. No further bleeding appreciated. Hgb 11.2 continue holding coumadin.  7-Lower abdominal pain: Resolved. Continue PRN pain meds and abx's/tx as dictated above.  8-Nausea and vomiting: will continue PRN antiemetics. Per GI ?? Need of EGD; will follow recommendations and will continue reglan schedule to help controlling her symptoms.  9-Renal transplant: will continue prograft and prednisone. Cr WNL. Tacrolimus level in therapeutic range.    LOS: 4 days   Darrik Richman [facility] [phone]  01/31/2012, 2:42 PM

## 2012-01-31 NOTE — Progress Notes (Signed)
Inpatient Diabetes Program Recommendations  AACE/ADA: New Consensus Statement on Inpatient Glycemic Control (2009)  Target Ranges:  Prepandial:   less than 140 mg/dL      Peak postprandial:   less than 180 mg/dL (1-2 hours)      Critically ill patients:  140 - 180 mg/dL   Reason for Visit: Results for Elizabeth [name], Elizabeth [name] (MRN [id number]) as of 01/31/2012 14:36  Ref. Range 01/30/2012 20:40 01/31/2012 00:20 01/31/2012 04:26 01/31/2012 08:23 01/31/2012 11:57  Glucose-Capillary Latest Range: 70-99 mg/dL 621 (H) 308 (H) 657 (H) 120 (H) 204 (H)    Inpatient Diabetes Program Recommendations Insulin - Basal: Increase Lantus to 13 units daily-(this was home dose) Correction (SSI): Please change correction to moderate tid with meals and HS (instead of q 4 hour sensitive) HgbA1C: A1C=6.3% indicating good control prior to admit.  Note: Will follow.

## 2012-01-31 NOTE — Progress Notes (Signed)
Pt had an elevated bp of 184/72. Pt was given 10 mg of hydralazine IV. Pt bp moved down to 122/82. RN will continue to monitor pt

## 2012-01-31 NOTE — Progress Notes (Signed)
     [facility] Daily Rounding Note 01/31/2012, 8:21 AM  SUBJECTIVE:       No flatus or BMs.  Still with nausea.  No belly pain currently.    OBJECTIVE:        General: looks chronically ill and pale, not toxic.     Vital signs in last 24 hours:    Temp:  [97.9 F (36.6 C)-98 F (36.7 C)] 98 F (36.7 C) (06/11 0619) Pulse Rate:  [53-58] 54  (06/11 0755) Resp:  [14-25] 20  (06/11 0619) BP: (114-192)/(40-74) 121/66 mmHg (06/11 0755) SpO2:  [94 %-100 %] 94 % (06/11 0755) Last BM Date: 01/30/12  Heart: RRR Chest: Clear.  No SOB.  Deep breath precipitated coughing spell. Abdomen: soft, BS hypoactive.  Extremities: no edema.  BKA on left Neuro/Psych:  Pleasant, affect flat.  No anxious, agitated or confused.  Moves all 4s.    Intake/Output from previous day: 06/10 0701 - 06/11 0700 In: 2272.5 [P.O.:120; I.V.:2152.5] Out: -  Not being consistently recorded Lab Results:  Basename 01/30/12 0445 01/29/12 0540  WBC 5.4 5.0  HGB 11.0* 10.8*  HCT 33.8* 33.2*  PLT 130* 129*   BMET  Basename 01/30/12 0445 01/29/12 0540  NA 138 139  K 3.5 3.6  CL 104 106  CO2 26 29  GLUCOSE 104* 111*  BUN 7 12  CREATININE 0.77 0.84  CALCIUM 8.5 8.7     Ref. Range 05/26/2010 17:24 01/28/2012 15:31  C difficile by pcr Latest Range: NEGATIVE  Not Detected... NEGATIVE    PT/INR  Basename 01/30/12 0445  LABPROT 17.9*  INR 1.45   ASSESMENT: *  Nausea.  Constipation.  Blood in stool.  6/10 colonoscopy:  Removal sessile ascending polyp, sig to descending diverticulosis and inflammation/segmental colitis:  bx'd with pending pathology.  C Diff negative. Hx colon adenoma in 2010.  No confirmation of CT finding of cecal mass Nausea without emesis persists.  Note taking Oxybutinin topical, anticholinergics can often have constipating effect, also on po calcium PTA, also constipating: on hold currently.  However, both meds predate onset of both constipation (fall 2012), nausea (present for 3 or so  months)  On Reglan 5 mg po AC, Protonix 40 mg qd, empiric Flagyl and Cipro IV, prn  *  Stable pancreatic cysts.  *  Atrial fib.  Chronic coumadin   *  2001 kidney transplant.  *  IDDM *  Thrombocytopenia  PLAN: *  Await path report.   *  Does she need EGD?   LOS: 4 days   Jennye Moccasin  01/31/2012, 8:21 AM Pager: [phone]   ________________________________________________________________________  [facility] MD note:  I personally examined the patient, reviewed the data and agree with the assessment and plan described above.  Will wait for colon biopsies to return to decide on any further endoscopes.     Rob Bunting, MD [facility] Pager [phone]

## 2012-01-31 NOTE — Care Management Note (Signed)
    Page 1 of 1   02/01/2012     2:47:48 PM   CARE MANAGEMENT NOTE 02/01/2012  Patient:  Elizabeth [name], Elizabeth [name]   Account Number:  [id number]  Date Initiated:  01/30/2012  Documentation initiated by:  Letha Cape  Subjective/Objective Assessment:   dx colitis  admit- lives with spouse.  pta spouse helps patient out at home.  Patient has a brace on rle and a left below knee leg amputation.     Action/Plan:   Anticipated DC Date:  02/01/2012   Anticipated DC Plan:  HOME/SELF CARE      DC Planning Services  CM consult      Choice offered to / List presented to:             Status of service:  Completed, signed off Medicare Important Message given?   (If response is "NO", the following Medicare IM given date fields will be blank) Date Medicare IM given:   Date Additional Medicare IM given:    Discharge Disposition:  HOME/SELF CARE  Per UR Regulation:  Reviewed for med. necessity/level of care/duration of stay  If discussed at Long Length of Stay Meetings, dates discussed:    Comments:  02/01/12 14:46 Letha Cape RN, BSN [phone] pt dc to home, no needs identified.  01/31/12 16:27 Letha Cape RN, BSN [phone] patient lives with spouse, spouse helps patient at home . Spouse was interested in a private duty list, which I gave to him, I also gave him a [facility] agency list for [facility]  Spouse states he does not think they need anyth HH services.  Patient has medication coverage throught [employer] and she has transportation home.  NCM will continue to follow for dc needs.

## 2012-01-31 NOTE — Progress Notes (Signed)
Pt had an elevated bp of 192/73. Pt was given 10 mg of hydralazine. RN will pass on to day shift nurse to reassess pt's bp.

## 2012-02-01 ENCOUNTER — Other Ambulatory Visit: Payer: Self-pay

## 2012-02-01 DIAGNOSIS — R109 Unspecified abdominal pain: Secondary | ICD-10-CM

## 2012-02-01 DIAGNOSIS — R935 Abnormal findings on diagnostic imaging of other abdominal regions, including retroperitoneum: Secondary | ICD-10-CM

## 2012-02-01 DIAGNOSIS — R933 Abnormal findings on diagnostic imaging of other parts of digestive tract: Secondary | ICD-10-CM

## 2012-02-01 DIAGNOSIS — E1165 Type 2 diabetes mellitus with hyperglycemia: Secondary | ICD-10-CM

## 2012-02-01 DIAGNOSIS — R197 Diarrhea, unspecified: Secondary | ICD-10-CM

## 2012-02-01 DIAGNOSIS — K529 Noninfective gastroenteritis and colitis, unspecified: Secondary | ICD-10-CM

## 2012-02-01 DIAGNOSIS — K559 Vascular disorder of intestine, unspecified: Secondary | ICD-10-CM

## 2012-02-01 LAB — GLUCOSE, CAPILLARY
Glucose-Capillary: 127 mg/dL — ABNORMAL HIGH (ref 70–99)
Glucose-Capillary: 249 mg/dL — ABNORMAL HIGH (ref 70–99)

## 2012-02-01 LAB — CBC
HCT: 33.8 % — ABNORMAL LOW (ref 36.0–46.0)
MCH: 29.4 pg (ref 26.0–34.0)
MCHC: 32.8 g/dL (ref 30.0–36.0)
RDW: 13.5 % (ref 11.5–15.5)

## 2012-02-01 LAB — BASIC METABOLIC PANEL
BUN: 15 mg/dL (ref 6–23)
Calcium: 8.9 mg/dL (ref 8.4–10.5)
GFR calc Af Amer: 57 mL/min — ABNORMAL LOW (ref >90)
GFR calc non Af Amer: 49 mL/min — ABNORMAL LOW (ref >90)
Glucose, Bld: 111 mg/dL — ABNORMAL HIGH (ref 70–99)
Potassium: 3.7 mEq/L (ref 3.5–5.1)
Sodium: 139 mEq/L (ref 135–145)

## 2012-02-01 MED ORDER — CIPROFLOXACIN HCL 500 MG PO TABS: 500.0000 mg | ORAL_TABLET | Freq: Two times a day (BID) | ORAL | Status: DC

## 2012-02-01 MED ORDER — WARFARIN SODIUM 2.5 MG PO TABS: 2.5000 mg | ORAL_TABLET | Freq: Every day | ORAL | Status: DC

## 2012-02-01 MED ORDER — METRONIDAZOLE 500 MG PO TABS: 500.0000 mg | ORAL_TABLET | Freq: Three times a day (TID) | ORAL | Status: DC

## 2012-02-01 MED ORDER — MESALAMINE 1.2 G PO TBEC: 4.8000 g | DELAYED_RELEASE_TABLET | Freq: Every day | ORAL | Status: DC

## 2012-02-01 MED ORDER — MESALAMINE 1.2 G PO TBEC
4.8000 g | DELAYED_RELEASE_TABLET | Freq: Every day | ORAL | Status: DC
Start: 2012-02-01 — End: 2012-02-01
  Administered 2012-02-01: 4.8 g via ORAL
  Filled 2012-02-01 (×2): qty 4

## 2012-02-01 NOTE — Progress Notes (Signed)
Elizabeth [name] Daily Rounding Note 02/01/2012, 8:35 AM  SUBJECTIVE:       Nausea resolved last Pm and this AM.  Tolerating solids, eating 75% of trays.   No stools.  Fells well and wants to go home.  OBJECTIVE:        General: looks non-toxic.  Pale.     Vital signs in last 24 hours:    Temp:  [97.7 F (36.5 C)-98.4 F (36.9 C)] 97.8 F (36.6 C) (06/12 0540) Pulse Rate:  [56] 56  (06/12 0540) Resp:  [16-20] 16  (06/12 0540) BP: (120-184)/(62-77) 163/77 mmHg (06/12 0540) SpO2:  [94 %-98 %] 97 % (06/12 0540) Last BM Date: 01/30/12  Heart: RRR Chest: Clear B.   Abdomen: Soft, active BS, NT, ND  Extremities: BKA amputation on left Neuro/Psych:  Pleasant, no confusion or agitation.   Intake/Output from previous day: 06/11 0701 - 06/12 0700 In: 870.8 [P.O.:720; I.V.:150.8] Out: -   Intake/Output this shift:    Lab Results:  Basename 02/01/12 0500 01/30/12 0445  WBC 4.6 5.4  HGB 11.1* 11.0*  HCT 33.8* 33.8*  PLT PLATELET CLUMPS NOTED ON SMEAR, COUNT APPEARS ADEQUATE 130*   BMET  Basename 02/01/12 0500 01/30/12 0445  NA 139 138  K 3.7 3.5  CL 106 104  CO2 24 26  GLUCOSE 111* 104*  BUN 15 7  CREATININE 1.10 0.77  CALCIUM 8.9 8.5   Surgical Pathology  01/30/2012 1. Colon, polyp(s), Ascending - TUBULAR ADENOMA. NO HIGH GRADE DYSPLASIA OR MALIGNANCY IDENTIFIED. 2. Colon, biopsy, Descending sigmoid - EROSION WITH FOCAL FIBROSIS AND HYPEREMIA. SEE MICROSCOPIC DESCRIPTION Microscopic Comment 2. There is patchy loss of the surface epithelium with underlying hyperemia and microhemorrhages. There are also foci of fibrosis within the lamina propria. No granulomas are identified. The differential includes mucosal changes from an area of diverticulosis, ischemia and mucosal prolapse.   ASSESMENT: * Nausea. Constipation alternating  With diarrhea. Blood in stool. 6/10 colonoscopy: Removal sessile, ascending, adenomatous polyp, sig to descending diverticulosis and  inflammation/segmental colitis. C Diff negative.  No confirmation of CT finding of cecal mass  Nausea without emesis persists.  Note taking Oxybutinin topical, anticholinergics can often have constipating effect, also on po calcium PTA, also constipating: on hold currently. However, both meds predate onset of both constipation (fall 2012), nausea (present for 3 or so months)  On Reglan 5 mg po AC, Protonix 40 mg qd, empiric Flagyl and Cipro IV, prn  * Stable pancreatic cysts.  * Atrial fib. Chronic coumadin  * 2001 kidney transplant.  * IDDM  * Thrombocytopenia   PLAN: *  Since no evidence infectious colitis, will d/c Cipro and Flagyl and stool studies.  *  Start Lialda 4.8 grams daily.  For possible diverticular related colitis.  *  Out patient CT angio of mesentery, to rule out vascular ischemia. I spoke with Dr Ephriam Knuckles in radiology, who says CT preferable to MRI    Needs to be done once discharged, epic will not allow me to make arrangements for outpt study while she is still inpt........   LOS: 5 days   Elizabeth [name]  02/01/2012, 8:35 AM Pager: [phone]  ________________________________________________________________________  [name] MD note:  I personally examined the patient, reviewed the data and agree with the assessment and plan described above.  Elizabeth [name] office will be scheduling examination of mesenteric vessels.  Given transplanted kidney will likely try mesenteric duplex examination.    Elizabeth Bunting, MD [facility]astroenterology Pager  [phone]  

## 2012-02-01 NOTE — Progress Notes (Signed)
Elizabeth [name] to be D/C'd Home per MD order.  Discussed with the patient and all questions fully answered.   [name], [name]  Home Medication Instructions WUJ:[id number]   Printed on:02/01/12 1337  Medication Information                    Calcium Carbonate-Vitamin D (CALCIUM 600-D) 600-400 MG-UNIT per tablet Take 1 tablet by mouth daily.            insulin lispro (HUMALOG) 100 UNIT/ML injection Inject 2-9 Units into the skin 3 (three) times daily before meals. Per sliding scale.           insulin glargine (LANTUS) 100 UNIT/ML injection Inject 13 Units into the skin every morning.            atorvastatin (LIPITOR) 10 MG tablet Take 10 mg by mouth every other day.            mycophenolate (MYFORTIC) 360 MG TBEC Take 360 mg by mouth 2 (two) times daily.            predniSONE (DELTASONE) 5 MG tablet Take 5 mg by mouth every morning.            tacrolimus (PROGRAF) 0.5 MG capsule Take 2 mg by mouth every morning. 2mg   every morning and 1mg  every evening           levothyroxine (SYNTHROID, LEVOTHROID) 175 MCG tablet Take 175 mcg by mouth See admin instructions. 1 tablet daily on Saturday and Sunday           hyoscyamine (HYOMAX) 0.125 MG tablet Take 1 tablet (0.125 mg total) by mouth every 4 (four) hours as needed for cramping.           sertraline (ZOLOFT) 25 MG tablet Take 25 mg by mouth every morning.            levothyroxine (SYNTHROID, LEVOTHROID) 150 MCG tablet Take 150 mcg by mouth See admin instructions. Take 1 tablet daily Monday through Friday           fludrocortisone (FLORINEF) 0.1 MG tablet Take 0.1 mg by mouth every other day.           Oxybutynin Chloride 10 % GEL Place 2 packets onto the skin every morning.           hyoscyamine (LEVSIN, ANASPAZ) 0.125 MG tablet Take 0.125 mg by mouth daily as needed. For bowel pain.           Multiple Vitamin (MULTIVITAMIN WITH MINERALS) TABS Take 1 tablet by mouth daily.           Magnesium 250 MG TABS Take  250 mg by mouth daily.           omeprazole (PRILOSEC) 20 MG capsule Take 20 mg by mouth every evening.           tacrolimus (PROGRAF) 0.5 MG capsule Take 1 mg by mouth at bedtime.           warfarin (COUMADIN) 2.5 MG tablet Take 1-2 tablets (2.5-5 mg total) by mouth daily. Take 2 tablets daily, except take 1 tablet on Thursdays.           ciprofloxacin (CIPRO) 500 MG tablet Take 1 tablet (500 mg total) by mouth 2 (two) times daily.           metroNIDAZOLE (FLAGYL) 500 MG tablet Take 1 tablet (500 mg total) by mouth 3 (three) times daily.  VVS, Skin clean, dry and intact without evidence of skin break down, no evidence of skin tears noted. IV catheter discontinued intact. Site without signs and symptoms of complications. Dressing and pressure applied.  An After Visit Summary was printed and given to the patient. Patient escorted via WC, and D/C home via private auto.  Kennyth Arnold D 02/01/2012 1:37 PM

## 2012-02-01 NOTE — Discharge Summary (Addendum)
Physician Discharge Summary  Elizabeth [name] ZOX:[id number] DOB: [birth year] DOA: 01/27/2012  PCP: Irena Cords, MD  Admit date: 01/27/2012 Discharge date: 02/01/2012  Discharge Diagnoses:  Principal Problem:  *Colitis Active Problems:  DIABETES MELLITUS, TYPE II  Atrial fibrillation  Encounter for long-term (current) use of anticoagulants  Constipation  Blood in stool  Nausea and vomiting  Benign neoplasm of colon   Discharge Condition: Stable  Disposition:  Follow-up Information    Follow up with COLADONATO,JOSEPH A, MD in 1 week. (follow up in 1 week)    Contact information:   [address] [employer] [place] [address] [phone]       Follow up with Stan Head, MD on 02/29/2012. (1:45 PM)    Contact information:   [address] [facility] [address] [place] [address] [phone]       Follow up with [facility]. (will be calling you with appt for a CT scan. )          Diet: Regular diet  History of present illness:  Elizabeth [name] is a 71 y.o. female has a past medical history of Coronary artery disease; Chronic kidney disease; Diabetes mellitus; PVD (peripheral vascular disease); History of orthostatic hypotension; Hyperlipidemia; Hypothyroidism; Anemia; Pancreatic cyst; Osteoporosis; Melanoma; Carotid bruit; Adenomatous polyp; GERD (gastroesophageal reflux disease); Gastritis; CVA (cerebral infarction); Bradycardia; Renal artery stenosis; Diabetic retinopathy; Renal osteodystrophy; Hyperparathyroidism; Leg ulcer; H/O immunosuppressive therapy; Renal disease; Fx wrist; Fx ankle; Neuropathy; Nephropathy; Carotid artery stenosis; and Stroke. Presented with She have not been feeling well for more than 6 months with bouts of constipation and diarrhea as well as nausea. For the past few days she started to have sever nausea and vomiting with now bloody diarrhea. She had a fever yesterday  99.1-100.4. 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 She has been seen by Dr. Leone Payor for her constipation. Last colonoscopy was in 6/302010 and was normal. A CT scan was done in ER that was found to have cecal mass. No recent weight loss. No family history of colon cancer. She have had abdominal pain for few months a lot of suprapubic pain.  She has hx of CVA with Left side vision loss. She is taking coumadin for hx of atrial fibrillation.    Hospital Course:  Principal Problem: -Colitis: Patient was admitted to the hospital was started on IV Cipro and Flagyl. C. Dif was checked which was negative. She continued to have nausea and vomiting [facility]  was consulted. Recommended a colonoscopy which was done that showed: Cecil polyp, removed sessile ascending polyp, sig to descending diverticulosis and inflammation/segmental colitis: bx'd with pending pathology. 5 days into the admission her nausea and vomiting resolved she was at home on a ten-day course of Cipro and Flagyl. And she'll followup with her primary care doctor and gastroenterology as an outpatient.  -Off note CT scan of the abdomen and pelvis show possible mass this was not seen on colonoscopy. Was neither seen in previous CT scan done about 6 weeks ago.   DIABETES MELLITUS, TYPE II (02/11/2009) -good controlled. No changes were made  Atrial fibrillation (01/28/2011) -Coumadin on hold, restart coumadin on 02/11/2012   Constipation (10/11/2011) -continue miralax.   Nausea and vomiting (01/28/2012) -Secondary to colitis resolved.    Discharge Exam: Filed Vitals:   02/01/12 1315  BP: 135/74  Pulse: 55  Temp: 97.6 F (36.4 C)  Resp: 20   Filed  Vitals:   02/01/12 0540 02/01/12 1225 02/01/12 1302 02/01/12 1315  BP: 163/77 180/74 180/70 135/74  Pulse: 56 55  55  Temp: 97.8 F (36.6 C) 97.9 F (36.6 C)  97.6 F (36.4 C)  TempSrc: Oral Oral  Oral  Resp: 16 18  20   Height:        Weight:      SpO2: 97% 97%  92%   See progress note  Discharge Instructions  Discharge Orders    Future Appointments: Provider: Department: Dept Phone: Center:   02/15/2012 11:00 AM [facility] [phone] [facility]   02/29/2012 1:45 PM Iva Boop, MD [facility] Office [phone] [facility]     Future Orders Please Complete By Expires   Diet - low sodium heart healthy      Increase activity slowly        Medication List  As of 02/01/2012  1:59 PM   TAKE these medications         atorvastatin 10 MG tablet   Commonly known as: LIPITOR   Take 10 mg by mouth every other day.      CALCIUM 600-D 600-400 MG-UNIT per tablet   Generic drug: Calcium Carbonate-Vitamin D   Take 1 tablet by mouth daily.      fludrocortisone 0.1 MG tablet   Commonly known as: FLORINEF   Take 0.1 mg by mouth every other day.      hyoscyamine 0.125 MG tablet   Commonly known as: LEVSIN, ANASPAZ   Take 0.125 mg by mouth daily as needed. For bowel pain.      hyoscyamine 0.125 MG tablet   Commonly known as: LEVSIN, ANASPAZ   Take 1 tablet (0.125 mg total) by mouth every 4 (four) hours as needed for cramping.      insulin glargine 100 UNIT/ML injection   Commonly known as: LANTUS   Inject 13 Units into the skin every morning.      insulin lispro 100 UNIT/ML injection   Commonly known as: HUMALOG   Inject 2-9 Units into the skin 3 (three) times daily before meals. Per sliding scale.      levothyroxine 175 MCG tablet   Commonly known as: SYNTHROID, LEVOTHROID   Take 175 mcg by mouth See admin instructions. 1 tablet daily on Saturday and Sunday      levothyroxine 150 MCG tablet   Commonly known as: SYNTHROID, LEVOTHROID   Take 150 mcg by mouth See admin instructions. Take 1 tablet daily Monday through Friday      Magnesium 250 MG Tabs   Take 250 mg by mouth daily.      mesalamine 1.2 G EC tablet   Commonly known as: LIALDA   Take 4 tablets (4.8 g total) by  mouth daily with breakfast.      multivitamin with minerals Tabs   Take 1 tablet by mouth daily.      mycophenolate 360 MG Tbec   Commonly known as: MYFORTIC   Take 360 mg by mouth 2 (two) times daily.      omeprazole 20 MG capsule   Commonly known as: PRILOSEC   Take 20 mg by mouth every evening.      Oxybutynin Chloride 10 % Gel   Place 2 packets onto the skin every morning.      predniSONE 5 MG tablet   Commonly known as: DELTASONE   Take 5 mg by mouth every morning.      sertraline 25 MG tablet   Commonly known  as: ZOLOFT   Take 25 mg by mouth every morning.      tacrolimus 0.5 MG capsule   Commonly known as: PROGRAF   Take 2 mg by mouth every morning. 2mg   every morning and 1mg  every evening      tacrolimus 0.5 MG capsule   Commonly known as: PROGRAF   Take 1 mg by mouth at bedtime.      warfarin 2.5 MG tablet   Commonly known as: COUMADIN   Take 1-2 tablets (2.5-5 mg total) by mouth daily. Take 2 tablets daily, except take 1 tablet on Thursdays.   Start taking on: 02/08/2012              The results of significant diagnostics from this hospitalization (including imaging, microbiology, ancillary and laboratory) are listed below for reference.    Significant Diagnostic Studies: Ct Abdomen Pelvis W Contrast  01/27/2012  *RADIOLOGY REPORT*  Clinical Data: Nausea/vomiting/diarrhea, blood in stool, kidney transplant  CT ABDOMEN AND PELVIS WITH CONTRAST  Technique:  Multidetector CT imaging of the abdomen and pelvis was performed following the standard protocol during bolus administration of intravenous contrast.  Contrast:  65 ml Omnipaque 300 IV  Comparison: [facility] CT abdomen pelvis dated 12/06/2011, [facility] MRI abdomen dated 07/04/2011  Findings: Mild linear scarring at the left lung base.  Cardiomegaly.  Coronary atherosclerosis with mitral valve annular calcifications.  Small hiatal hernia.  Liver, spleen, and adrenal glands are within normal limits.  Stable  cystic pancreatic lesions associated with the pancreatic head/uncinate process (series 2/images 22 and 25), better visualized/characterized on prior MRI.  Status post cholecystectomy.  No intrahepatic ductal dilatation. Stable common bile duct measuring up to 9 mm.  Native renal atrophy.  Left lower quadrant renal transplant with subcentimeter upper pole cysts and a stable extrarenal pelvis without frank hydronephrosis.  No evidence of bowel obstruction.  Mass-like thickening of the cecum (series 2/image 51; coronal image 54), worrisome for colonic adenocarcinoma.  Small pericecal lymph nodes measuring up to 5 mm short axis (series 2/image 48).  Diffuse wall thickening involving a relatively long segment of sigmoid colon (series 2/image 64), suspicious for colitis.  No drainable fluid collection or abscess.  Atherosclerotic calcifications of the abdominal aorta and branch vessels.  Extensive vascular calcifications.  No abdominopelvic ascites.  No suspicious abdominopelvic lymphadenopathy.  Status post hysterectomy.  No adnexal masses.  Bladder is mildly thick-walled.  Mild degenerative changes of the visualized thoracolumbar spine.  IMPRESSION: Mass-like thickening of the cecum, worrisome for colonic adenocarcinoma.  Small pericecal lymph nodes measuring up to 5 mm short axis.  Colonoscopy is suggested for further evaluation.  Diffuse wall thickening involving the sigmoid colon, suspicious for colitis.  Left lower quadrant renal transplant.  Stable extrarenal pelvis without frank hydronephrosis.  Stable cystic pancreatic lesions associated with the pancreatic head/uncinate process, better visualized/characterized on prior MRI.  These results were called by telephone on 01/27/2012  at  1850 hours to  Dr Carleene Cooper, who verbally acknowledged these results.  Original Report Authenticated By: Charline Bills, M.D.   Dg Abd Acute W/chest  01/27/2012  *RADIOLOGY REPORT*  Clinical Data: Nausea, vomiting, diarrhea,  abdominal pain  ACUTE ABDOMEN SERIES (ABDOMEN 2 VIEW & CHEST 1 VIEW)  Comparison: CT abdomen pelvis of 12/06/2011 and abdomen films of 11/11/2011, and chest x-ray of 12/14/2010  Findings: No active infiltrate or effusion is seen.  There is moderate cardiomegaly present.  Considerable calcification of the mitral annulus is noted.  Median sternotomy sutures  are present.  Supine and left lower decubitus films of the abdomen were obtained. No bowel obstruction is seen.  No free air is noted.  Multiple surgical clips are present in the left lower pelvis and abdomen. There is considerable arterial calcification diffusely.  The bones are osteopenic.  IMPRESSION:  1.  Cardiomegaly.  No active lung disease. 2.  No bowel obstruction.  No free air. 3.  Considerable arterial calcification.  Original Report Authenticated By: Juline Patch, M.D.    Microbiology: Recent Results (from the past 240 hour(s))  MRSA PCR SCREENING     Status: Normal   Collection Time   01/27/12 10:55 PM      Component Value Range Status Comment   MRSA by PCR NEGATIVE  NEGATIVE Final   CLOSTRIDIUM DIFFICILE BY PCR     Status: Normal   Collection Time   01/28/12  3:31 PM      Component Value Range Status Comment   C difficile by pcr NEGATIVE  NEGATIVE Final      Labs: Basic Metabolic Panel:  Lab 02/01/12 1610 01/30/12 0445 01/29/12 0540 01/28/12 0650 01/27/12 1257  NA 139 138 139 139 134*  K 3.7 3.5 -- -- --  CL 106 104 106 104 98  CO2 24 26 29 28 29   GLUCOSE 111* 104* 111* 120* 316*  BUN 15 7 12 21  35*  CREATININE 1.10 0.77 0.84 0.91 1.13*  CALCIUM 8.9 8.5 8.7 8.8 9.3  MG -- -- -- 1.6 --  PHOS -- -- -- 2.5 --   Liver Function Tests:  Lab 01/28/12 0650 01/27/12 1257  AST 14 16  ALT 11 14  ALKPHOS 41 50  BILITOT 0.7 0.8  PROT 4.8* 5.4*  ALBUMIN 2.6* 3.1*    Lab 01/27/12 1257  LIPASE 9*  AMYLASE --   No results found for this basename: AMMONIA:5 in the last 168 hours CBC:  Lab 02/01/12 0500 01/30/12 0445  01/29/12 0540 01/28/12 0650 01/27/12 1257  WBC 4.6 5.4 5.0 5.6 8.9  NEUTROABS -- -- -- -- 7.7  HGB 11.1* 11.0* 10.8* 10.6* 11.7*  HCT 33.8* 33.8* 33.2* 32.9* 36.8  MCV 89.7 88.3 90.0 89.2 89.5  PLT PLATELET CLUMPS NOTED ON SMEAR, COUNT APPEARS ADEQUATE 130* 129* 121* 134*   Cardiac Enzymes: No results found for this basename: CKTOTAL:5,CKMB:5,CKMBINDEX:5,TROPONINI:5 in the last 168 hours BNP: No components found with this basename: POCBNP:5 CBG:  Lab 02/01/12 1215 02/01/12 0752 01/31/12 2138 01/31/12 2042 01/31/12 1631  GLUCAP 249* 127* 246* 207* 172*    Time coordinating discharge: Greater than 45 minutes  Signed:  Marinda Elk  [facility] 02/01/2012, 1:59 PM

## 2012-02-01 NOTE — Progress Notes (Signed)
[facility] PROGRESS NOTE  Elizabeth [name] OZH:[id number] DOB: 1940/11/18 DOA: 01/27/2012   Assessment/Plan: Patient Active Hospital Problem List: Colitis Infectious (01/27/2012) -C. Diff negative; Patient status post colonoscopy, findings suggesting infection. Biopsy pending. -tolerating diet and advancing.  DIABETES MELLITUS, TYPE II (02/11/2009) -good controlled.  Atrial fibrillation (01/28/2011) -Coumadin on hold, restart coumadin on 02/11/2012  Encounter for long-term (current) use of anticoagulants (01/28/2011) -see afib.  Constipation (10/11/2011)  -continue miralax.  Nausea and vomiting (01/28/2012) Secondary to colitis resolved.     Code Status: Full code Family Communication: Spouse [phone] Disposition Plan: home.  Lambert Keto, MD  [facility] Pager [phone]  If 7PM-7AM, please contact night-coverage [email or web address] Password [email or web address] 02/01/2012, 9:45 AM   LOS: 5 days   Procedures: Colonoscopy: sessile polyp, inflammation of the sigmoid and descending. Probable infectious.  Antibiotics:  cipro 01/30/2012>>  flafyl 01/30/2012>>  Interim History:   Subjective: No complains, tolerating diet. Wants to go home.  Objective: Filed Vitals:   01/31/12 1412 01/31/12 2035 01/31/12 2105 02/01/12 0540  BP: 122/63 184/72 120/62 163/77  Pulse: 56 56  56  Temp: 97.7 F (36.5 C) 98.4 F (36.9 C)  97.8 F (36.6 C)  TempSrc: Oral Oral  Oral  Resp: 20 18  16   Height:      Weight:      SpO2: 94% 98%  97%    Intake/Output Summary (Last 24 hours) at 02/01/12 0945 Last data filed at 02/01/12 0841  Gross per 24 hour  Intake 990.83 ml  Output      0 ml  Net 990.83 ml   Weight change:   Exam:  General: Alert, awake, oriented x3, in no acute distress.  HEENT: No bruits, no goiter.  Heart: Regular rate and rhythm, without murmurs, rubs, gallops.  Lungs: Good air movement, clear to auscultation b/l Abdomen: Soft, nontender, nondistended,  positive bowel sounds.  Neuro: Grossly intact, nonfocal.   Data Reviewed: Basic Metabolic Panel:  Lab 02/01/12 2536 01/30/12 0445 01/29/12 0540 01/28/12 0650 01/27/12 1257  NA 139 138 139 139 134*  K 3.7 3.5 -- -- --  CL 106 104 106 104 98  CO2 24 26 29 28 29   GLUCOSE 111* 104* 111* 120* 316*  BUN 15 7 12 21  35*  CREATININE 1.10 0.77 0.84 0.91 1.13*  CALCIUM 8.9 8.5 8.7 8.8 9.3  MG -- -- -- 1.6 --  PHOS -- -- -- 2.5 --   Liver Function Tests:  Lab 01/28/12 0650 01/27/12 1257  AST 14 16  ALT 11 14  ALKPHOS 41 50  BILITOT 0.7 0.8  PROT 4.8* 5.4*  ALBUMIN 2.6* 3.1*    Lab 01/27/12 1257  LIPASE 9*  AMYLASE --   No results found for this basename: AMMONIA:5 in the last 168 hours CBC:  Lab 02/01/12 0500 01/30/12 0445 01/29/12 0540 01/28/12 0650 01/27/12 1257  WBC 4.6 5.4 5.0 5.6 8.9  NEUTROABS -- -- -- -- 7.7  HGB 11.1* 11.0* 10.8* 10.6* 11.7*  HCT 33.8* 33.8* 33.2* 32.9* 36.8  MCV 89.7 88.3 90.0 89.2 89.5  PLT PLATELET CLUMPS NOTED ON SMEAR, COUNT APPEARS ADEQUATE 130* 129* 121* 134*   Cardiac Enzymes: No results found for this basename: CKTOTAL:5,CKMB:5,CKMBINDEX:5,TROPONINI:5 in the last 168 hours BNP: No components found with this basename: POCBNP:5 CBG:  Lab 02/01/12 0752 01/31/12 2138 01/31/12 2042 01/31/12 1631 01/31/12 1157  GLUCAP 127* 246* 207* 172* 204*    Recent Results (from the past 240 hour(s))  MRSA PCR SCREENING  Status: Normal   Collection Time   01/27/12 10:55 PM      Component Value Range Status Comment   MRSA by PCR NEGATIVE  NEGATIVE Final   CLOSTRIDIUM DIFFICILE BY PCR     Status: Normal   Collection Time   01/28/12  3:31 PM      Component Value Range Status Comment   C difficile by pcr NEGATIVE  NEGATIVE Final      Studies: Ct Abdomen Pelvis W Contrast  01/27/2012  *RADIOLOGY REPORT*  Clinical Data: Nausea/vomiting/diarrhea, blood in stool, kidney transplant  CT ABDOMEN AND PELVIS WITH CONTRAST  Technique:  Multidetector CT imaging  of the abdomen and pelvis was performed following the standard protocol during bolus administration of intravenous contrast.  Contrast:  65 ml Omnipaque 300 IV  Comparison: [place] CT abdomen pelvis dated 12/06/2011, [facility] MRI abdomen dated 07/04/2011  Findings: Mild linear scarring at the left lung base.  Cardiomegaly.  Coronary atherosclerosis with mitral valve annular calcifications.  Small hiatal hernia.  Liver, spleen, and adrenal glands are within normal limits.  Stable cystic pancreatic lesions associated with the pancreatic head/uncinate process (series 2/images 22 and 25), better visualized/characterized on prior MRI.  Status post cholecystectomy.  No intrahepatic ductal dilatation. Stable common bile duct measuring up to 9 mm.  Native renal atrophy.  Left lower quadrant renal transplant with subcentimeter upper pole cysts and a stable extrarenal pelvis without frank hydronephrosis.  No evidence of bowel obstruction.  Mass-like thickening of the cecum (series 2/image 51; coronal image 54), worrisome for colonic adenocarcinoma.  Small pericecal lymph nodes measuring up to 5 mm short axis (series 2/image 48).  Diffuse wall thickening involving a relatively long segment of sigmoid colon (series 2/image 64), suspicious for colitis.  No drainable fluid collection or abscess.  Atherosclerotic calcifications of the abdominal aorta and branch vessels.  Extensive vascular calcifications.  No abdominopelvic ascites.  No suspicious abdominopelvic lymphadenopathy.  Status post hysterectomy.  No adnexal masses.  Bladder is mildly thick-walled.  Mild degenerative changes of the visualized thoracolumbar spine.  IMPRESSION: Mass-like thickening of the cecum, worrisome for colonic adenocarcinoma.  Small pericecal lymph nodes measuring up to 5 mm short axis.  Colonoscopy is suggested for further evaluation.  Diffuse wall thickening involving the sigmoid colon, suspicious for colitis.  Left lower quadrant renal  transplant.  Stable extrarenal pelvis without frank hydronephrosis.  Stable cystic pancreatic lesions associated with the pancreatic head/uncinate process, better visualized/characterized on prior MRI.  These results were called by telephone on 01/27/2012  at  1850 hours to  Dr Carleene Cooper, who verbally acknowledged these results.  Original Report Authenticated By: Charline Bills, M.D.   Dg Abd Acute W/chest  01/27/2012  *RADIOLOGY REPORT*  Clinical Data: Nausea, vomiting, diarrhea, abdominal pain  ACUTE ABDOMEN SERIES (ABDOMEN 2 VIEW & CHEST 1 VIEW)  Comparison: CT abdomen pelvis of 12/06/2011 and abdomen films of 11/11/2011, and chest x-ray of 12/14/2010  Findings: No active infiltrate or effusion is seen.  There is moderate cardiomegaly present.  Considerable calcification of the mitral annulus is noted.  Median sternotomy sutures are present.  Supine and left lower decubitus films of the abdomen were obtained. No bowel obstruction is seen.  No free air is noted.  Multiple surgical clips are present in the left lower pelvis and abdomen. There is considerable arterial calcification diffusely.  The bones are osteopenic.  IMPRESSION:  1.  Cardiomegaly.  No active lung disease. 2.  No bowel obstruction.  No free air.  3.  Considerable arterial calcification.  Original Report Authenticated By: Juline Patch, M.D.    Scheduled Meds:   . atorvastatin  10 mg Oral QODAY  . fludrocortisone  0.1 mg Oral QODAY  . insulin aspart  0-9 Units Subcutaneous Q4H  . insulin glargine  13 Units Subcutaneous Q breakfast  . levothyroxine  150 mcg Oral Custom  . levothyroxine  175 mcg Oral Custom  . magnesium oxide  400 mg Oral Daily  . metoCLOPramide  5 mg Oral TID AC  . mycophenolate  360 mg Oral BID  . Oxybutynin Chloride  1 packet Transdermal q morning - 10a  . pantoprazole  40 mg Oral Q1200  . predniSONE  5 mg Oral q morning - 10a  . sertraline  25 mg Oral q morning - 10a  . sodium chloride  3 mL Intravenous  Q12H  . tacrolimus  1 mg Oral QHS  . tacrolimus  2 mg Oral q morning - 10a  . DISCONTD: ciprofloxacin  400 mg Intravenous Q12H  . DISCONTD: ciprofloxacin  500 mg Oral BID  . DISCONTD: insulin glargine  7 Units Subcutaneous Q breakfast  . DISCONTD: metronidazole  500 mg Intravenous Q8H  . DISCONTD: metroNIDAZOLE  500 mg Oral Q8H   Continuous Infusions:   . sodium chloride 1,000 mL (02/01/12 0550)  . DISCONTD: sodium chloride 1,000 mL (01/31/12 1101)

## 2012-02-02 MED ORDER — METOCLOPRAMIDE HCL 5 MG/5ML PO SOLN: 5.0000 mg | Freq: Three times a day (TID) | ORAL | Status: DC

## 2012-02-02 NOTE — Progress Notes (Signed)
Reglan 5 mg three times a day before meals # 90 1 refill

## 2012-02-02 NOTE — Progress Notes (Signed)
Patient and husband advised of the appt for mesenteric duplex ultrasound at [facility] on 02/20/12 10:00.  She is advised to be NPO after midnight and to bring a light snack.  She c/o nausea that returned this am.  She was on reglan in the hospital that controlled this.  She has phenergan but doesn't like the way it makes her feel.  She is requesting an alternate medication for nausea.  Dr Leone Payor can we send her in reglan or zofran?

## 2012-02-06 ENCOUNTER — Telehealth: Payer: Self-pay | Admitting: Internal Medicine

## 2012-02-06 NOTE — Telephone Encounter (Signed)
1) stop Lialda - balance of evidence suggests ischemia, not IBD 2) see me next week - please work in - July 10 appoint too long a  wait 3) If she is worsening before then call us back

## 2012-02-06 NOTE — Telephone Encounter (Signed)
Patient reports watery stools about every 2-3 hours.  She reports vomiting Sat and Sunday and 1 episode this am.  She reports that she feels horrible and weak.  She denies abdominal pain, fever or rectal bleeding.  I have paged Dr Leone Payor to discuss.

## 2012-02-06 NOTE — Telephone Encounter (Addendum)
Discussed with Dr. Leone Payor , he recommends ER eval.  I spoke with the patient and the patient's husband.  He reports her symptoms are not quite as bad as what she reported.  He says she has a chronic cough that makes her gag at times and this has precipitated some of the vomiting episodes.  He states that she had had 4 loose stools all day yesterday.  The patient nor the husband want to go to the ER.  I have reviewed the discrepancies in what she told me to what he is reporting and that she reported she feels as bad as she did when she was admitted last time minus the pain.  They state that at this time they don't want an ER eval.  I advised them this was their decision but our recommendation stands. He says that if she looks worse then he will take her to the ER.  He does not feel she is dehydrated because she can still stand and walk.  I reminded him the she has a transplanted kidney and she should seek ER evaluation prior to not being able to stand or walk. He promised he will take her to the ER if her symptoms continue to worsen.    He reports that she only has one day left of lialda that was prescribed in the hospital.  Dr Leone Payor should she continue Lialda.

## 2012-02-06 NOTE — Telephone Encounter (Signed)
Patient and husband advised.  REV rescheduled for 02/14/12 3:15.  They will call or go to ER if her symptoms worsen.

## 2012-02-08 ENCOUNTER — Other Ambulatory Visit: Payer: Self-pay | Admitting: Cardiovascular Disease

## 2012-02-08 NOTE — Telephone Encounter (Signed)
Please refill coumadin

## 2012-02-14 ENCOUNTER — Ambulatory Visit (INDEPENDENT_AMBULATORY_CARE_PROVIDER_SITE_OTHER): Payer: Medicare Other | Admitting: Internal Medicine

## 2012-02-14 ENCOUNTER — Encounter: Payer: Self-pay | Admitting: Internal Medicine

## 2012-02-14 VITALS — BP 110/50 | HR 60

## 2012-02-14 DIAGNOSIS — K5289 Other specified noninfective gastroenteritis and colitis: Secondary | ICD-10-CM

## 2012-02-14 DIAGNOSIS — K589 Irritable bowel syndrome without diarrhea: Secondary | ICD-10-CM

## 2012-02-14 DIAGNOSIS — Z86010 Personal history of colonic polyps: Secondary | ICD-10-CM

## 2012-02-14 DIAGNOSIS — K529 Noninfective gastroenteritis and colitis, unspecified: Secondary | ICD-10-CM

## 2012-02-14 NOTE — Patient Instructions (Addendum)
We will contact you after you have your test on 02/20/2012 with the results and plans.

## 2012-02-14 NOTE — Progress Notes (Signed)
Patient ID: Elizabeth [name], female   DOB: November 19, 1940, 71 y.o.   MRN: [id number]  Patient presents with her husband to  discuss segmental colitis discovered hospitalization in early June. She presented with acute abdominal pain and hematochezia. There was a minor decline in hemoglobin. CT scanning was performed, without IV contrast, and suggested a cecal mass as well as a question of colitis. Colonoscopy demonstrated a colitis from the sigmoid to the descending colon where there was diverticulosis as well. Biopsy showed focal erosion and inflammation, nonspecific findings. Since hospitalization she has had right lower quadrant pain. This is better, and overall tolerable and not disabling. She's been having bowel movements usually formed though several a day her husband reports. She has not been constipated. Her vision is somewhat poor so she cannot see the consistency or color of the bowel movements.  The other problem had been nausea which was quite severe and constant. She had a brief course of mesalamine. She call to the office complaining of the nausea and I suggested she stop taking the mesalamine while were sorting things out. Particularly in light of the nausea. She and her husband decided to stop Zoloft, since the nausea seemed to intensify after starting out a few months ago, and they thought that it did not really helped depressive symptoms. The nausea is persistent but lasts, and not clearly a strong as it was prior to stopping Zoloft 5 days ago.  Medications, allergies, past medical history, past surgical history, family history and social history are reviewed and updated in the EMR.   Physical exam is limited the vital signs in general appearance. She is a chronically ill elderly white woman in no acute distress, in a wheelchair with a left lower prosthetic limb.  Assessment and plan:   1. Colitis   2. IBS (irritable bowel syndrome)    Overall, I am most suspicious of ischemic colitis  still. Probably segmental mucosal ischemia related to spasm given her IBS history. She is on for a duplex ultrasound of the mesenteric vessels to look for major vessel ischemia, she will have that in 6 days. I think that's reasonable given her overall situation. When that is completed I will call with the results. She will remain off Zoloft, seems to have tolerated coming off of that. Still think she's had some underlying depressive symptomatology but she seems to be in better spirits at this point, though she is not severely constipated and does not seem to be having incontinence issues that we did not directly discussed that she did not bring that up. Her abdominal pain is actually less at this point, it seems. She can use the hyoscyamine or Tylenol for that.  Once the ultrasound results later and we'll determine a course of action, depending upon her symptoms. She could have a segmental diverticulosis associated colitis as well but again I think with her acute onset of abdominal pain, as well as hematochezia and the endoscopic findings I favor ischemic colitis, again most likely related to spasm. I have explained this to the patient and her husband.  Note she asked if I would be willing to see her granddaughter who is 5 and is having abdominal pain problems and I told her I would be but they would need to: Perhaps speak to me to schedule this, she will let her daughter know.  CC: COLADONATO,JOSEPH A, MD

## 2012-02-15 ENCOUNTER — Other Ambulatory Visit: Payer: Self-pay | Admitting: Cardiology

## 2012-02-15 ENCOUNTER — Ambulatory Visit (INDEPENDENT_AMBULATORY_CARE_PROVIDER_SITE_OTHER): Payer: Medicare Other

## 2012-02-15 DIAGNOSIS — I4891 Unspecified atrial fibrillation: Secondary | ICD-10-CM

## 2012-02-15 DIAGNOSIS — I639 Cerebral infarction, unspecified: Secondary | ICD-10-CM

## 2012-02-15 DIAGNOSIS — I82629 Acute embolism and thrombosis of deep veins of unspecified upper extremity: Secondary | ICD-10-CM

## 2012-02-15 DIAGNOSIS — K559 Vascular disorder of intestine, unspecified: Secondary | ICD-10-CM

## 2012-02-15 DIAGNOSIS — I6350 Cerebral infarction due to unspecified occlusion or stenosis of unspecified cerebral artery: Secondary | ICD-10-CM

## 2012-02-15 DIAGNOSIS — Z7901 Long term (current) use of anticoagulants: Secondary | ICD-10-CM

## 2012-02-15 LAB — CMV (CYTOMEGALOVIRUS) DNA ULTRAQUANT, PCR: Specimen Source-CMV: NO GROWTH

## 2012-02-20 ENCOUNTER — Encounter (INDEPENDENT_AMBULATORY_CARE_PROVIDER_SITE_OTHER): Payer: Medicare Other

## 2012-02-20 DIAGNOSIS — R109 Unspecified abdominal pain: Secondary | ICD-10-CM

## 2012-02-20 DIAGNOSIS — I700 Atherosclerosis of aorta: Secondary | ICD-10-CM

## 2012-02-20 DIAGNOSIS — K559 Vascular disorder of intestine, unspecified: Secondary | ICD-10-CM

## 2012-02-27 ENCOUNTER — Encounter: Payer: Self-pay | Admitting: Internal Medicine

## 2012-02-27 DIAGNOSIS — K551 Chronic vascular disorders of intestine: Secondary | ICD-10-CM

## 2012-02-27 HISTORY — DX: Chronic vascular disorders of intestine: K55.1

## 2012-02-27 NOTE — Progress Notes (Signed)
Quick Note:  This suggests reduced mesenteric blood flow. Results discussed with patient and Dr. Arrie Aran. Plan is observation for now and reserve further testing and evaluation by vascular surgeons if more problems.  She still has some pain and constipation so I want to see Elizabeth [name] week of 7/22 to reassess and will likely schedule a flex sig after that.  Please call her to arrange appointment Please print report and cc Dr. Arrie Aran ______

## 2012-02-28 ENCOUNTER — Telehealth: Payer: Self-pay | Admitting: Internal Medicine

## 2012-02-28 NOTE — Telephone Encounter (Signed)
Patient wants Dr. Leone Payor to see her granddaughter.  I have scheduled an appt for 03/08/12.  They will bring the records to the appt

## 2012-02-29 ENCOUNTER — Ambulatory Visit: Payer: Medicare Other | Admitting: Internal Medicine

## 2012-03-09 ENCOUNTER — Encounter: Payer: Self-pay | Admitting: *Deleted

## 2012-03-12 ENCOUNTER — Encounter: Payer: Self-pay | Admitting: Internal Medicine

## 2012-03-12 ENCOUNTER — Ambulatory Visit (INDEPENDENT_AMBULATORY_CARE_PROVIDER_SITE_OTHER): Payer: Medicare Other | Admitting: Internal Medicine

## 2012-03-12 VITALS — BP 120/52 | HR 56 | Ht 64.0 in | Wt 156.4 lb

## 2012-03-12 DIAGNOSIS — K589 Irritable bowel syndrome without diarrhea: Secondary | ICD-10-CM

## 2012-03-12 DIAGNOSIS — K551 Chronic vascular disorders of intestine: Secondary | ICD-10-CM

## 2012-03-12 MED ORDER — PSYLLIUM 28.3 % PO POWD: ORAL | Status: DC

## 2012-03-12 NOTE — Assessment & Plan Note (Signed)
observe

## 2012-03-12 NOTE — Assessment & Plan Note (Signed)
improved

## 2012-03-12 NOTE — Progress Notes (Signed)
Patient ID: Elizabeth [name], female   DOB: 09-27-1940, 71 y.o.   MRN: [id number]   CC: Follow-up of colitis, suspect ischemic and s/p mesenteric duplex scan  HPI:  The patient is here with husband. She is having loose stools at times but no constipation. Abdominal pain is much less overall. Bowels "still not back to normal". I explained that mesenteric duplex scan showed reduced blood flow in celiac and IMA and only a small increase in flow after ensure in SMA.  Medications, allergies, past medical history, past surgical history, family history and social history are reviewed and updated in the EMR.  PE:  Elderly, NAD, looks improved, in wheelchair   Ass/Plan: 1. Arteriosclerosis, mesenteric artery   Not surprising she has this. i explained that Dr. Arrie Aran and I have decided that observation is appropriate as contrast injection to evaluate and treat would jeopardize transplanted kidney.   2. Irritable bowel syndrome   Improved. Not constipated after the ischemic colitis. This may return. Observe, resume MiraLax if needed.  Follow-up me as needed.   WN:[id number] A, MD

## 2012-03-12 NOTE — Patient Instructions (Addendum)
Follow-Up with Dr. Leone Payor as needed.   Thank you for choosing me and [facility].

## 2012-03-14 ENCOUNTER — Ambulatory Visit (INDEPENDENT_AMBULATORY_CARE_PROVIDER_SITE_OTHER): Payer: Medicare Other

## 2012-03-14 DIAGNOSIS — I82629 Acute embolism and thrombosis of deep veins of unspecified upper extremity: Secondary | ICD-10-CM

## 2012-03-14 DIAGNOSIS — I6350 Cerebral infarction due to unspecified occlusion or stenosis of unspecified cerebral artery: Secondary | ICD-10-CM

## 2012-03-14 DIAGNOSIS — Z7901 Long term (current) use of anticoagulants: Secondary | ICD-10-CM

## 2012-03-14 DIAGNOSIS — I4891 Unspecified atrial fibrillation: Secondary | ICD-10-CM

## 2012-03-14 DIAGNOSIS — I639 Cerebral infarction, unspecified: Secondary | ICD-10-CM

## 2012-03-14 LAB — POCT INR: INR: 2.9

## 2012-03-19 ENCOUNTER — Telehealth: Payer: Self-pay | Admitting: Internal Medicine

## 2012-03-20 ENCOUNTER — Other Ambulatory Visit: Payer: Self-pay

## 2012-03-20 MED ORDER — HYOSCYAMINE SULFATE 0.125 MG PO TABS: ORAL_TABLET | ORAL | Status: DC

## 2012-03-20 NOTE — Telephone Encounter (Signed)
Pt informed that this has been done per their request and ok'ed by Dr. Shelle Iron

## 2012-03-20 NOTE — Telephone Encounter (Signed)
Message copied by [place], Davidjames Blansett E on Tue Mar 20, 2012  4:35 PM ------      Message from: Iva Boop      Created: Tue Mar 20, 2012  2:21 PM       90 day supply with 3 refills for same sig  And dose is fine on the Levsin. An elderly warning will come up but she is ok on this drug.                  ----- Message -----         From: Dishon Kehoe E [place], CMA         Sent: 03/20/2012  10:09 AM           To: Iva Boop, MD            Spoke to pts husband yest. They are requesting if ok'ed by you to get a 90 day supply of Levsin sent to [facility].  Saves them $.  She takes prn, but they wanted to make sure that she can use this long term prn.  Thank  You for your help.

## 2012-04-11 ENCOUNTER — Ambulatory Visit (INDEPENDENT_AMBULATORY_CARE_PROVIDER_SITE_OTHER): Payer: Medicare Other

## 2012-04-11 DIAGNOSIS — Z7901 Long term (current) use of anticoagulants: Secondary | ICD-10-CM

## 2012-04-11 DIAGNOSIS — I82629 Acute embolism and thrombosis of deep veins of unspecified upper extremity: Secondary | ICD-10-CM

## 2012-04-11 DIAGNOSIS — I4891 Unspecified atrial fibrillation: Secondary | ICD-10-CM

## 2012-04-11 DIAGNOSIS — I6350 Cerebral infarction due to unspecified occlusion or stenosis of unspecified cerebral artery: Secondary | ICD-10-CM

## 2012-04-11 DIAGNOSIS — I639 Cerebral infarction, unspecified: Secondary | ICD-10-CM

## 2012-04-11 LAB — POCT INR: INR: 2.6

## 2012-04-13 ENCOUNTER — Telehealth: Payer: Self-pay | Admitting: Internal Medicine

## 2012-04-13 NOTE — Telephone Encounter (Signed)
Patient calling to report she is having problems with reflux x 1 week. Last night, it was so bad she vomited x 1. She notices the pain after eating lunch and dinner. Esophageal pain in breast bone area. Patient is on Prilosec daily and will increase this to BID. Scheduled OV on 04/30/12 at 2:15 PM with Dr. Leone Payor.

## 2012-04-16 ENCOUNTER — Other Ambulatory Visit: Payer: Self-pay | Admitting: Nephrology

## 2012-04-16 DIAGNOSIS — R34 Anuria and oliguria: Secondary | ICD-10-CM

## 2012-04-17 ENCOUNTER — Ambulatory Visit
Admission: RE | Admit: 2012-04-17 | Discharge: 2012-04-17 | Disposition: A | Discharge status: Home / Self Care | Payer: Medicare Other | Source: Ambulatory Visit | Attending: Nephrology | Admitting: Nephrology

## 2012-04-17 DIAGNOSIS — R34 Anuria and oliguria: Secondary | ICD-10-CM

## 2012-04-25 ENCOUNTER — Other Ambulatory Visit: Payer: Self-pay | Admitting: Dermatology

## 2012-04-30 ENCOUNTER — Encounter: Payer: Self-pay | Admitting: Internal Medicine

## 2012-04-30 ENCOUNTER — Ambulatory Visit: Payer: Medicare Other | Admitting: Internal Medicine

## 2012-04-30 ENCOUNTER — Ambulatory Visit (INDEPENDENT_AMBULATORY_CARE_PROVIDER_SITE_OTHER): Payer: Medicare Other | Admitting: Internal Medicine

## 2012-04-30 VITALS — BP 98/48 | HR 53 | Ht 64.0 in | Wt 154.5 lb

## 2012-04-30 DIAGNOSIS — K5900 Constipation, unspecified: Secondary | ICD-10-CM

## 2012-04-30 DIAGNOSIS — K559 Vascular disorder of intestine, unspecified: Secondary | ICD-10-CM

## 2012-04-30 DIAGNOSIS — R1030 Lower abdominal pain, unspecified: Secondary | ICD-10-CM

## 2012-04-30 DIAGNOSIS — R109 Unspecified abdominal pain: Secondary | ICD-10-CM

## 2012-04-30 DIAGNOSIS — R1310 Dysphagia, unspecified: Secondary | ICD-10-CM

## 2012-04-30 NOTE — Progress Notes (Signed)
Subjective:    Patient ID: Elizabeth [name], female    DOB: August 08, 1941, 71 y.o.   MRN: [id number]  HPI The patient returns with her husband for problems with painful swallowing. She had called in sound like she was having worsening reflux in her PPI was doubled but that has not helped. She tells me she has painful swallowing with liquids and solids it seems to be intensifying. She denies any oral thrush. She's not had fevers. Her medications have remained essentially the same.  She continues with lower abdominal pain. Constipation is less severe but still an issue. She will use Dulcolax every once in a while with success after going several days without defecation. Is not really done better with linaclotide or Amitiza or other laxatives. Believe she is still using some MiraLax. When she does defecate she gets some relief of abdominal pain though it is short-lived. She has multiple questions about her colitis and why she has that and the diagnosis of poor mesenteric blood flow.  Allergies  Allergen Reactions  . Percocet (Oxycodone-Acetaminophen) Nausea And Vomiting   Outpatient Prescriptions Prior to Visit  Medication Sig Dispense Refill  . atorvastatin (LIPITOR) 10 MG tablet Take 10 mg by mouth every other day.       . Calcium Carbonate-Vitamin D (CALCIUM 600-D) 600-400 MG-UNIT per tablet Take 1 tablet by mouth daily.       . Cholecalciferol (VITAMIN D PO) Take 1 tablet by mouth. Take one 800 capsule at night      . Cholecalciferol (VITAMIN D3 SUPER STRENGTH) 2000 UNITS TABS Take 1 tablet by mouth daily at 12 noon.       . insulin glargine (LANTUS) 100 UNIT/ML injection Inject 13 Units into the skin every morning.       . insulin lispro (HUMALOG) 100 UNIT/ML injection Inject 2-9 Units into the skin 3 (three) times daily before meals. Per sliding scale.      . levothyroxine (SYNTHROID, LEVOTHROID) 150 MCG tablet Take 150 mcg by mouth See admin instructions. Take 1 tablet daily Monday through  Friday      . levothyroxine (SYNTHROID, LEVOTHROID) 175 MCG tablet Take 175 mcg by mouth See admin instructions. 1 tablet daily on Saturday and Sunday      . Magnesium 250 MG TABS Take 250 mg by mouth daily.      . Multiple Vitamin (MULTIVITAMIN WITH MINERALS) TABS Take 1 tablet by mouth daily.      . mycophenolate (MYFORTIC) 360 MG TBEC Take 360 mg by mouth 2 (two) times daily.       Marland Kitchen omeprazole (PRILOSEC) 20 MG capsule Take 20 mg by mouth 2 (two) times daily.       . Oxybutynin Chloride 10 % GEL Place 1 packet onto the skin every morning.       . predniSONE (DELTASONE) 5 MG tablet Take 5 mg by mouth every morning.       . tacrolimus (PROGRAF) 0.5 MG capsule Take 2 mg by mouth every morning. 2mg   every morning and 1mg  every evening      . warfarin (COUMADIN) 2.5 MG tablet Take 1-2 tablets (2.5-5 mg total) by mouth daily. Take 2 tablets daily, except take 1 tablet on Thursdays.      . hyoscyamine (LEVSIN, ANASPAZ) 0.125 MG tablet Take 1 -2 tablets every day For bowel pain.  180 tablet  3  . fludrocortisone (FLORINEF) 0.1 MG tablet Take 0.1 mg by mouth every other day.      Marland Kitchen  Psyllium (METAMUCIL) 28.3 % POWD 1-2 teaspoons daily       Past Medical History  Diagnosis Date  . Coronary artery disease   . Chronic kidney disease     s/p cadaveric renal transplant in 2001  . Diabetes mellitus   . PVD (peripheral vascular disease)   . History of orthostatic hypotension   . Hyperlipidemia   . Hypothyroidism   . Anemia   . Pancreatic cyst   . Osteoporosis     recurrent fractures  . Melanoma   . Carotid bruit     left  . Adenomatous polyp   . GERD (gastroesophageal reflux disease)   . Gastritis   . CVA (cerebral infarction)   . Bradycardia   . Renal artery stenosis   . Diabetic retinopathy   . Renal osteodystrophy   . Hyperparathyroidism   . Leg ulcer     from poor fitting prosthetic  . H/O immunosuppressive therapy   . 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 Fx wrist     . Fx ankle   . Neuropathy   . Nephropathy   . Carotid artery stenosis     mild  . Stroke   . Tubular adenoma   . Colitis   . IBS (irritable bowel syndrome)   . Ischemic colitis   . Arteriosclerosis, mesenteric artery 02/27/2012    Duplex US shows >70% stenosis celiac and signs of stenosis of SMA and IMA    Past Surgical History  Procedure Date  . Kidney transplant 08/15/2000    [facility] in [place]  . 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 Appendectomy   . Cataract extraction   . Cholecystectomy   . Abdominal hysterectomy   . Thyroid surgery   . Tonsillectomy   . Cesarean section     x 2  . Vitrectomy     right eye  . Coronary artery bypass graft   . Colonoscopy w/ biopsies and polypectomy 02/18/2009    adenomatous polyps, diverticulosis, internal hemorrhoids  . Eus 02/04/2010    w/FNA, pancreatic cyst  . Upper gastrointestinal endoscopy 12/01/2009  . Colonoscopy 01/30/2012    Procedure: COLONOSCOPY;  Surgeon: Rachael Fee, MD;  Location: [facility] ENDOSCOPY;  Service: Endoscopy;  Laterality: N/A;   History   Social History  . Marital Status: Married    Spouse Name: N/A    Number of Children: 2  .     Occupational History  . retired Engineer, civil (consulting)    Social History Main Topics  . Smoking status: Never Smoker   . Smokeless tobacco: Never Used  . Alcohol Use: No  . Drug Use: No   Family History  Problem Relation Age of Onset  . Stroke Mother 73  . Heart attack Father 81  . Colon cancer Neg Hx   . Diabetes Neg Hx    Review of Systems As per history of present illness    Objective:   Physical Exam General:  NAD in wheelchair Eyes:   Anicteric Mouth:  No lesions Lungs:  clear Heart:  S1S2 no rubs, murmurs or gallops Abdomen:  soft and mildly tender lower,bilateral Extremities: Status post left lower extremity amputation with prosthesis in place   Data Reviewed:  I reviewed the findings of her mesenteric Doppler study that  showed poor blood flow, prior colonoscopy, prior CT scanning. I explained her risks of contrast infusion namely it would jeopardize the function  of her kidney transplant significantly and potentially put her back on dialysis.     Assessment & Plan:   1. Odynophagia   Given that she is immunosuppressed she is at risk for opportunistic infection of the esophagus. Upper endoscopy will be performed Wednesday, September 11. The risks and benefits as well as alternatives of endoscopic procedure(s) have been discussed and reviewed. All questions answered. The patient agrees to proceed. She is on warfarin, biopsy should be except above on this.   2. Constipation   She is to try Dulcolax every other day, she is afraid of getting diarrhea-like she's had in the past with impactions and some laxatives. She may need to take this every day as it provides a reasonable bowel movement for her and may improve her quality of life by reducing pain.   3. Abdominal pain, lower   4. Mesenteric ischemia   She may have chronic ischemia of the gut, it's quite likely given her known atherosclerosis. I have discussed this with Dr. Arrie Aran, and we both feel that workup with contrast studies or angiography has to many risks to perform an her at this point. Hopefully if her bowel function improves she will have less pain but I explained to her that there really are limited options to help her overall without significant risks that could lead her worse off.    I appreciate the opportunity to care for this patient.   CC: COLADONATO,JOSEPH A, MD

## 2012-04-30 NOTE — Patient Instructions (Addendum)
You have been scheduled for an endoscopy at [facility]. Please follow written instructions given to you at your visit today. If you use inhalers (even only as needed), please bring them with you on the day of your procedure.    Please use dulcolax every other night and stop your hyoscyamine.   Thank you for choosing me and [facility].  Iva Boop, M.D., [facility]

## 2012-05-02 ENCOUNTER — Ambulatory Visit (HOSPITAL_COMMUNITY)
Admission: RE | Admit: 2012-05-02 | Discharge: 2012-05-02 | Disposition: A | Discharge status: Home / Self Care | Payer: Medicare Other | Source: Ambulatory Visit | Attending: Internal Medicine | Admitting: Internal Medicine

## 2012-05-02 ENCOUNTER — Encounter (HOSPITAL_COMMUNITY)
Admission: RE | Disposition: A | Discharge status: Home / Self Care | Payer: Self-pay | Source: Ambulatory Visit | Attending: Internal Medicine

## 2012-05-02 ENCOUNTER — Other Ambulatory Visit: Payer: Self-pay | Admitting: Internal Medicine

## 2012-05-02 ENCOUNTER — Encounter (HOSPITAL_COMMUNITY): Payer: Self-pay | Admitting: *Deleted

## 2012-05-02 DIAGNOSIS — E1129 Type 2 diabetes mellitus with other diabetic kidney complication: Secondary | ICD-10-CM | POA: Insufficient documentation

## 2012-05-02 DIAGNOSIS — N058 Unspecified nephritic syndrome with other morphologic changes: Secondary | ICD-10-CM | POA: Insufficient documentation

## 2012-05-02 DIAGNOSIS — Z79899 Other long term (current) drug therapy: Secondary | ICD-10-CM | POA: Insufficient documentation

## 2012-05-02 DIAGNOSIS — K5900 Constipation, unspecified: Secondary | ICD-10-CM | POA: Insufficient documentation

## 2012-05-02 DIAGNOSIS — R1310 Dysphagia, unspecified: Secondary | ICD-10-CM | POA: Insufficient documentation

## 2012-05-02 DIAGNOSIS — N189 Chronic kidney disease, unspecified: Secondary | ICD-10-CM | POA: Insufficient documentation

## 2012-05-02 DIAGNOSIS — Z794 Long term (current) use of insulin: Secondary | ICD-10-CM | POA: Insufficient documentation

## 2012-05-02 DIAGNOSIS — B3781 Candidal esophagitis: Secondary | ICD-10-CM | POA: Diagnosis present

## 2012-05-02 DIAGNOSIS — Z940 Kidney transplant status: Secondary | ICD-10-CM | POA: Insufficient documentation

## 2012-05-02 HISTORY — PX: ESOPHAGOGASTRODUODENOSCOPY: SHX5428

## 2012-05-02 LAB — GLUCOSE, CAPILLARY: Glucose-Capillary: 198 mg/dL — ABNORMAL HIGH (ref 70–99)

## 2012-05-02 SURGERY — EGD (ESOPHAGOGASTRODUODENOSCOPY)
Anesthesia: Moderate Sedation

## 2012-05-02 MED ORDER — DIPHENHYDRAMINE HCL 50 MG/ML IJ SOLN
INTRAMUSCULAR | Status: DC | PRN
  Administered 2012-05-02: 25 mg via INTRAVENOUS

## 2012-05-02 MED ORDER — FENTANYL CITRATE 0.05 MG/ML IJ SOLN
INTRAMUSCULAR | Status: AC
  Filled 2012-05-02: qty 2

## 2012-05-02 MED ORDER — FLUCONAZOLE 100 MG PO TABS: 100.0000 mg | ORAL_TABLET | Freq: Every day | ORAL | Status: AC

## 2012-05-02 MED ORDER — DIPHENHYDRAMINE HCL 50 MG/ML IJ SOLN
INTRAMUSCULAR | Status: AC
  Filled 2012-05-02: qty 1

## 2012-05-02 MED ORDER — FLUCONAZOLE 100 MG PO TABS: 100.0000 mg | ORAL_TABLET | Freq: Every day | ORAL | Status: DC

## 2012-05-02 MED ORDER — FENTANYL CITRATE 0.05 MG/ML IJ SOLN
INTRAMUSCULAR | Status: DC | PRN
  Administered 2012-05-02 (×2): 25 ug via INTRAVENOUS

## 2012-05-02 MED ORDER — SODIUM CHLORIDE 0.9 % IV SOLN: INTRAVENOUS | Status: DC

## 2012-05-02 MED ORDER — MIDAZOLAM HCL 10 MG/2ML IJ SOLN
INTRAMUSCULAR | Status: AC
  Filled 2012-05-02: qty 2

## 2012-05-02 MED ORDER — MIDAZOLAM HCL 10 MG/2ML IJ SOLN
INTRAMUSCULAR | Status: DC | PRN
  Administered 2012-05-02 (×2): 2 mg via INTRAVENOUS

## 2012-05-02 MED ORDER — BUTAMBEN-TETRACAINE-BENZOCAINE 2-2-14 % EX AERO
INHALATION_SPRAY | CUTANEOUS | Status: DC | PRN
  Administered 2012-05-02: 2 via TOPICAL

## 2012-05-02 MED ORDER — LIDOCAINE HCL 1 % IJ SOLN
INTRAMUSCULAR | Status: AC
  Filled 2012-05-02: qty 20

## 2012-05-02 NOTE — Op Note (Signed)
[facility] [address] [place], [address]   ENDOSCOPY PROCEDURE REPORT  PATIENT: Elizabeth [name], Elizabeth [name]  MR#: [id number] BIRTHDATE: 1941-05-05 , 71  yrs. old GENDER: Female ENDOSCOPIST: Iva Boop, MD, [facility] REFERRED BY: PROCEDURE DATE:  05/02/2012 PROCEDURE:  EGD w/ biopsy ASA CLASS:     Class IV INDICATIONS:  odynophagia. MEDICATIONS: Fentanyl 50 mcg IV, Versed 4 mg IV, and Benadryl 25 mg IV TOPICAL ANESTHETIC: Cetacaine Spray  DESCRIPTION OF PROCEDURE: After the risks benefits and alternatives of the procedure were thoroughly explained, informed consent was obtained.  The [id number] endoscope was introduced through the mouth and advanced to the second portion of the duodenum.        Adult gastroscope changed to pediatric gastroscope due to coughing and gagging - better tolerated but still had difficult, limited exam slightly. The instrument was slowly withdrawn as the mucosa was fully examined.      ESOPHAGUS: White exudates consistent with candidiasis were found in the upper third of the esophagus and middle third of the esophagus. Multiple biopsies taken. The remainder of the esophagus was normal.  The remainder of the upper endoscopy exam was otherwise normal. Retroflexed views revealed no abnormalities.     The scope was then withdrawn from the patient and the procedure completed.  COMPLICATIONS: There were no complications. ENDOSCOPIC IMPRESSION: 1.   White exudates consistent with candidiasis in the upper and middle esophagus. Biopsies taken. 2.   The remainder of the upper endoscopy exam was otherwise normal  RECOMMENDATIONS: Start fluconazole - 21 day course Get INR and tacrolimus levels next week will call results and plans - if resolved needs GI f/u prn    eSigned:  Iva Boop, MD, [facility] 05/02/2012 2:59 PM   [id number] [place], MD and The Patient  PATIENT NAME:  Elizabeth, [name] MR#: [id number]

## 2012-05-02 NOTE — H&P (View-Only) (Signed)
Subjective:    Patient ID: Elizabeth [name], female    DOB: 06/14/1941, 71 y.o.   MRN: [id number]  HPI The patient returns with her husband for problems with painful swallowing. She had called in sound like she was having worsening reflux in her PPI was doubled but that has not helped. She tells me she has painful swallowing with liquids and solids it seems to be intensifying. She denies any oral thrush. She's not had fevers. Her medications have remained essentially the same.  She continues with lower abdominal pain. Constipation is less severe but still an issue. She will use Dulcolax every once in a while with success after going several days without defecation. Is not really done better with linaclotide or Amitiza or other laxatives. Believe she is still using some MiraLax. When she does defecate she gets some relief of abdominal pain though it is short-lived. She has multiple questions about her colitis and why she has that and the diagnosis of poor mesenteric blood flow.  Allergies  Allergen Reactions  . Percocet (Oxycodone-Acetaminophen) Nausea And Vomiting   Outpatient Prescriptions Prior to Visit  Medication Sig Dispense Refill  . atorvastatin (LIPITOR) 10 MG tablet Take 10 mg by mouth every other day.       . Calcium Carbonate-Vitamin D (CALCIUM 600-D) 600-400 MG-UNIT per tablet Take 1 tablet by mouth daily.       . Cholecalciferol (VITAMIN D PO) Take 1 tablet by mouth. Take one 800 capsule at night      . Cholecalciferol (VITAMIN D3 SUPER STRENGTH) 2000 UNITS TABS Take 1 tablet by mouth daily at 12 noon.       . insulin glargine (LANTUS) 100 UNIT/ML injection Inject 13 Units into the skin every morning.       . insulin lispro (HUMALOG) 100 UNIT/ML injection Inject 2-9 Units into the skin 3 (three) times daily before meals. Per sliding scale.      . levothyroxine (SYNTHROID, LEVOTHROID) 150 MCG tablet Take 150 mcg by mouth See admin instructions. Take 1 tablet daily Monday through  Friday      . levothyroxine (SYNTHROID, LEVOTHROID) 175 MCG tablet Take 175 mcg by mouth See admin instructions. 1 tablet daily on Saturday and Sunday      . Magnesium 250 MG TABS Take 250 mg by mouth daily.      . Multiple Vitamin (MULTIVITAMIN WITH MINERALS) TABS Take 1 tablet by mouth daily.      . mycophenolate (MYFORTIC) 360 MG TBEC Take 360 mg by mouth 2 (two) times daily.       . omeprazole (PRILOSEC) 20 MG capsule Take 20 mg by mouth 2 (two) times daily.       . Oxybutynin Chloride 10 % GEL Place 1 packet onto the skin every morning.       . predniSONE (DELTASONE) 5 MG tablet Take 5 mg by mouth every morning.       . tacrolimus (PROGRAF) 0.5 MG capsule Take 2 mg by mouth every morning. 2mg  every morning and 1mg every evening      . warfarin (COUMADIN) 2.5 MG tablet Take 1-2 tablets (2.5-5 mg total) by mouth daily. Take 2 tablets daily, except take 1 tablet on Thursdays.      . hyoscyamine (LEVSIN, ANASPAZ) 0.125 MG tablet Take 1 -2 tablets every day For bowel pain.  180 tablet  3  . fludrocortisone (FLORINEF) 0.1 MG tablet Take 0.1 mg by mouth every other day.      .   Psyllium (METAMUCIL) 28.3 % POWD 1-2 teaspoons daily       Past Medical History  Diagnosis Date  . Coronary artery disease   . Chronic kidney disease     s/p cadaveric renal transplant in 2001  . Diabetes mellitus   . PVD (peripheral vascular disease)   . History of orthostatic hypotension   . Hyperlipidemia   . Hypothyroidism   . Anemia   . Pancreatic cyst   . Osteoporosis     recurrent fractures  . Melanoma   . Carotid bruit     left  . Adenomatous polyp   . GERD (gastroesophageal reflux disease)   . Gastritis   . CVA (cerebral infarction)   . Bradycardia   . Renal artery stenosis   . Diabetic retinopathy   . Renal osteodystrophy   . Hyperparathyroidism   . Leg ulcer     from poor fitting prosthetic  . H/O immunosuppressive therapy   . 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 Fx wrist     . Fx ankle   . Neuropathy   . Nephropathy   . Carotid artery stenosis     mild  . Stroke   . Tubular adenoma   . Colitis   . IBS (irritable bowel syndrome)   . Ischemic colitis   . Arteriosclerosis, mesenteric artery 02/27/2012    Duplex US shows >70% stenosis celiac and signs of stenosis of SMA and IMA    Past Surgical History  Procedure Date  . Kidney transplant 08/15/2000    [facility] in [place]  . 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 Appendectomy   . Cataract extraction   . Cholecystectomy   . Abdominal hysterectomy   . Thyroid surgery   . Tonsillectomy   . Cesarean section     x 2  . Vitrectomy     right eye  . Coronary artery bypass graft   . Colonoscopy w/ biopsies and polypectomy 02/18/2009    adenomatous polyps, diverticulosis, internal hemorrhoids  . Eus 02/04/2010    w/FNA, pancreatic cyst  . Upper gastrointestinal endoscopy 12/01/2009  . Colonoscopy 01/30/2012    Procedure: COLONOSCOPY;  Surgeon: Daniel P Jacobs, MD;  Location: [facility];  Service: Endoscopy;  Laterality: N/A;   History   Social History  . Marital Status: Married    Spouse Name: N/A    Number of Children: 2  .     Occupational History  . retired nurse    Social History Main Topics  . Smoking status: Never Smoker   . Smokeless tobacco: Never Used  . Alcohol Use: No  . Drug Use: No   Family History  Problem Relation Age of Onset  . Stroke Mother 73  . Heart attack Father 84  . Colon cancer Neg Hx   . Diabetes Neg Hx    Review of Systems As per history of present illness    Objective:   Physical Exam General:  NAD in wheelchair Eyes:   Anicteric Mouth:  No lesions Lungs:  clear Heart:  S1S2 no rubs, murmurs or gallops Abdomen:  soft and mildly tender lower,bilateral Extremities: Status post left lower extremity amputation with prosthesis in place   Data Reviewed:  I reviewed the findings of her mesenteric Doppler study that  showed poor blood flow, prior colonoscopy, prior CT scanning. I explained her risks of contrast infusion namely it would jeopardize the function   of her kidney transplant significantly and potentially put her back on dialysis.     Assessment & Plan:   1. Odynophagia   Given that she is immunosuppressed she is at risk for opportunistic infection of the esophagus. Upper endoscopy will be performed Wednesday, September 11. The risks and benefits as well as alternatives of endoscopic procedure(s) have been discussed and reviewed. All questions answered. The patient agrees to proceed. She is on warfarin, biopsy should be except above on this.   2. Constipation   She is to try Dulcolax every other day, she is afraid of getting diarrhea-like she's had in the past with impactions and some laxatives. She may need to take this every day as it provides a reasonable bowel movement for her and may improve her quality of life by reducing pain.   3. Abdominal pain, lower   4. Mesenteric ischemia   She may have chronic ischemia of the gut, it's quite likely given her known atherosclerosis. I have discussed this with Dr. Coladonato, and we both feel that workup with contrast studies or angiography has to many risks to perform an her at this point. Hopefully if her bowel function improves she will have less pain but I explained to her that there really are limited options to help her overall without significant risks that could lead her worse off.    I appreciate the opportunity to care for this patient.   CC: COLADONATO,JOSEPH A, MD  

## 2012-05-02 NOTE — Interval H&P Note (Signed)
History and Physical Interval Note:  05/02/2012 2:20 PM  Elizabeth [name]  has presented today for surgery, with the diagnosis of Painful swallowing [787.20]  The various methods of treatment have been discussed with the patient and family. After consideration of risks, benefits and other options for treatment, the patient has consented to  Procedure(s) (LRB) with comments: ESOPHAGOGASTRODUODENOSCOPY (EGD) (N/A) as a surgical intervention .  The patient's history has been reviewed, patient examined, no change in status, stable for surgery.  I have reviewed the patient's chart and labs.  Questions were answered to the patient's satisfaction.     Stan Head, MD, [facility]

## 2012-05-03 ENCOUNTER — Encounter (HOSPITAL_COMMUNITY): Payer: Self-pay | Admitting: Internal Medicine

## 2012-05-03 ENCOUNTER — Encounter (HOSPITAL_COMMUNITY): Payer: Self-pay

## 2012-05-03 ENCOUNTER — Other Ambulatory Visit: Payer: Self-pay

## 2012-05-03 NOTE — Progress Notes (Signed)
Quick Note:  Let her know biopsies confirm Candida as suspected - fluconazole has ben started ______

## 2012-05-03 NOTE — Telephone Encounter (Signed)
Per Dr. Leone Payor called [facility] and cancelled the generic Diflucan sent in yest. By accident.

## 2012-05-09 ENCOUNTER — Other Ambulatory Visit: Payer: Self-pay

## 2012-05-09 ENCOUNTER — Ambulatory Visit (INDEPENDENT_AMBULATORY_CARE_PROVIDER_SITE_OTHER): Payer: Medicare Other

## 2012-05-09 DIAGNOSIS — I6350 Cerebral infarction due to unspecified occlusion or stenosis of unspecified cerebral artery: Secondary | ICD-10-CM

## 2012-05-09 DIAGNOSIS — I4891 Unspecified atrial fibrillation: Secondary | ICD-10-CM

## 2012-05-09 DIAGNOSIS — Z7901 Long term (current) use of anticoagulants: Secondary | ICD-10-CM

## 2012-05-09 DIAGNOSIS — I82629 Acute embolism and thrombosis of deep veins of unspecified upper extremity: Secondary | ICD-10-CM

## 2012-05-09 DIAGNOSIS — I639 Cerebral infarction, unspecified: Secondary | ICD-10-CM

## 2012-05-09 LAB — POCT INR: INR: >8

## 2012-05-09 LAB — PROTIME-INR: INR: >10 (ref 0.8–1.2)

## 2012-05-09 MED ORDER — PHYTONADIONE 5 MG PO TABS: 2.5000 mg | ORAL_TABLET | Freq: Once | ORAL | Status: DC

## 2012-05-09 NOTE — Patient Instructions (Addendum)
Called pt and husband advised 5mg  vit K called into pharmacy instructed to take 1/2 tablet now.   Hold Coumadin x 3 days, restart Coumadin Saturday 05/12/12 take 2.5mg   daily, made appt to recheck INR on 05/14/12.  Pt and pt's husband aware.  Instructed to go to ED immediately if any bleeding problems arise, they verbalize understanding.

## 2012-05-14 ENCOUNTER — Ambulatory Visit (INDEPENDENT_AMBULATORY_CARE_PROVIDER_SITE_OTHER): Payer: Medicare Other

## 2012-05-14 DIAGNOSIS — I6350 Cerebral infarction due to unspecified occlusion or stenosis of unspecified cerebral artery: Secondary | ICD-10-CM

## 2012-05-14 DIAGNOSIS — Z7901 Long term (current) use of anticoagulants: Secondary | ICD-10-CM

## 2012-05-14 DIAGNOSIS — I82629 Acute embolism and thrombosis of deep veins of unspecified upper extremity: Secondary | ICD-10-CM

## 2012-05-14 DIAGNOSIS — I4891 Unspecified atrial fibrillation: Secondary | ICD-10-CM

## 2012-05-14 DIAGNOSIS — I639 Cerebral infarction, unspecified: Secondary | ICD-10-CM

## 2012-05-18 ENCOUNTER — Telehealth: Payer: Self-pay | Admitting: Internal Medicine

## 2012-05-18 NOTE — Telephone Encounter (Signed)
Sorry but do not have any other recommendations. She needs to check with Dr. Arrie Aran re: can she get further work-up of her blood vessels - have avoided contrast because of risk to kidney.  I will try to get a message to him by next week

## 2012-05-18 NOTE — Telephone Encounter (Signed)
Patient reports continued odynophagia and Right sided abdominal pain.  She reports she is constipated.  She has only been taking the miralax every few days.  She is advised to increase to daily and if needed BID to prevent constipation.  She is advised that her swallowing should improve with the diflucan and she reports she is taking it.  She wants her appt moved up die to the abdominal pain.  She is advised that I can have her see one of the extenders in Dr. Marvell Fuller absence next week, but she declines only wants to see him.  Dr. Leone Payor do you have any additional recommendations?

## 2012-05-18 NOTE — Telephone Encounter (Signed)
I have advised the patient of Dr. Marvell Fuller recommendations.  She is asked to follow up with Dr. Arrie Aran.

## 2012-05-21 ENCOUNTER — Ambulatory Visit (INDEPENDENT_AMBULATORY_CARE_PROVIDER_SITE_OTHER): Payer: Medicare Other

## 2012-05-21 DIAGNOSIS — Z7901 Long term (current) use of anticoagulants: Secondary | ICD-10-CM

## 2012-05-21 DIAGNOSIS — I6350 Cerebral infarction due to unspecified occlusion or stenosis of unspecified cerebral artery: Secondary | ICD-10-CM

## 2012-05-21 DIAGNOSIS — I639 Cerebral infarction, unspecified: Secondary | ICD-10-CM

## 2012-05-21 DIAGNOSIS — I4891 Unspecified atrial fibrillation: Secondary | ICD-10-CM

## 2012-05-21 DIAGNOSIS — I82629 Acute embolism and thrombosis of deep veins of unspecified upper extremity: Secondary | ICD-10-CM

## 2012-05-24 ENCOUNTER — Telehealth: Payer: Self-pay | Admitting: Internal Medicine

## 2012-05-24 NOTE — Telephone Encounter (Signed)
Patient still having esophageal pain with meals.  She completed the diflucan yesterday.  She wants to know the next step.

## 2012-05-24 NOTE — Telephone Encounter (Signed)
She needs to repeat the Diflucan course again If still having problems after that I will need to rescope her.

## 2012-05-25 MED ORDER — FLUCONAZOLE 100 MG PO TABS: ORAL_TABLET | ORAL | Status: DC

## 2012-05-25 NOTE — Telephone Encounter (Addendum)
Patient advised of Dr. Marvell Fuller recommendations.  She is advised to hold lipitor while taking diflucan and she needs to have her PT/INR and tacrolumis level checked next week  CC: Dr. Arrie Aran

## 2012-05-25 NOTE — Addendum Note (Signed)
Addended by: Annett Fabian on: 05/25/2012 11:13 AM   Modules accepted: Orders

## 2012-05-28 ENCOUNTER — Ambulatory Visit (INDEPENDENT_AMBULATORY_CARE_PROVIDER_SITE_OTHER): Payer: Medicare Other

## 2012-05-28 DIAGNOSIS — I82629 Acute embolism and thrombosis of deep veins of unspecified upper extremity: Secondary | ICD-10-CM

## 2012-05-28 DIAGNOSIS — I4891 Unspecified atrial fibrillation: Secondary | ICD-10-CM

## 2012-05-28 DIAGNOSIS — Z7901 Long term (current) use of anticoagulants: Secondary | ICD-10-CM

## 2012-05-28 DIAGNOSIS — I639 Cerebral infarction, unspecified: Secondary | ICD-10-CM

## 2012-05-28 DIAGNOSIS — I6350 Cerebral infarction due to unspecified occlusion or stenosis of unspecified cerebral artery: Secondary | ICD-10-CM

## 2012-05-30 ENCOUNTER — Ambulatory Visit: Payer: Medicare Other | Admitting: Cardiovascular Disease

## 2012-06-06 ENCOUNTER — Encounter: Payer: Self-pay | Admitting: Cardiovascular Disease

## 2012-06-06 ENCOUNTER — Ambulatory Visit (INDEPENDENT_AMBULATORY_CARE_PROVIDER_SITE_OTHER): Payer: Medicare Other | Admitting: Cardiovascular Disease

## 2012-06-06 ENCOUNTER — Ambulatory Visit (INDEPENDENT_AMBULATORY_CARE_PROVIDER_SITE_OTHER): Payer: Medicare Other

## 2012-06-06 VITALS — BP 102/58 | HR 53 | Ht 63.0 in | Wt 149.2 lb

## 2012-06-06 DIAGNOSIS — I82629 Acute embolism and thrombosis of deep veins of unspecified upper extremity: Secondary | ICD-10-CM

## 2012-06-06 DIAGNOSIS — I951 Orthostatic hypotension: Secondary | ICD-10-CM

## 2012-06-06 DIAGNOSIS — Z7901 Long term (current) use of anticoagulants: Secondary | ICD-10-CM

## 2012-06-06 DIAGNOSIS — I4891 Unspecified atrial fibrillation: Secondary | ICD-10-CM

## 2012-06-06 DIAGNOSIS — I639 Cerebral infarction, unspecified: Secondary | ICD-10-CM

## 2012-06-06 DIAGNOSIS — I495 Sick sinus syndrome: Secondary | ICD-10-CM

## 2012-06-06 DIAGNOSIS — K551 Chronic vascular disorders of intestine: Secondary | ICD-10-CM

## 2012-06-06 DIAGNOSIS — E785 Hyperlipidemia, unspecified: Secondary | ICD-10-CM

## 2012-06-06 DIAGNOSIS — I6350 Cerebral infarction due to unspecified occlusion or stenosis of unspecified cerebral artery: Secondary | ICD-10-CM

## 2012-06-06 DIAGNOSIS — I2510 Atherosclerotic heart disease of native coronary artery without angina pectoris: Secondary | ICD-10-CM

## 2012-06-06 NOTE — Patient Instructions (Addendum)
Try brand name coumadin 2.5 mg   Please restart lipitor when fluconazole is complete  Please call us if you have new issues that need to be addressed before your next appt.  Your physician wants you to follow-up in: 6 months.  You will receive a reminder letter in the mail two months in advance. If you don't receive a letter, please call our office to schedule the follow-up appointment.

## 2012-06-06 NOTE — Assessment & Plan Note (Signed)
We have discussed with the patient that if her symptoms get worse, percutaneous intervention of her celiac vessel should be considered. This would be done by interventional cardiology or vascular surgery in the cardiac catheterization lab.

## 2012-06-06 NOTE — Assessment & Plan Note (Signed)
Currently on warfarin. Maintaining normal sinus rhythm.

## 2012-06-06 NOTE — Assessment & Plan Note (Signed)
We have suggested that she restart her statin as soon as possible after fluconazole has finished. She would likely be able to handle both medications.

## 2012-06-06 NOTE — Assessment & Plan Note (Signed)
Currently with no symptoms of angina. No further workup at this time. Continue current medication regimen. 

## 2012-06-06 NOTE — Progress Notes (Signed)
Patient ID: Elizabeth [name], female    DOB: May 16, 1941, 71 y.o.   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 Florinef was started earlier in 2012 with improvement of her symptoms and the ability to work with physical therapy.   history of aortic atherosclerosis, hyperlipidemia, labile blood pressures  She has vision problems, continues to have balance problems, and was told that she has vascular dementia.  She is taking Florinef 0.1 mg every other day with improvement of her dizziness. She is not doing her regular exercises. She has had more problems with her abdomen. She has waxing waning constipation and loose bowel movements .Diagnosis of ischemic bowel. She is seeing a doctor at [facility] for second opinion. Also diagnosed with yeast esophagitis, being treated with fluconazole.   Echo showed EF 50-55% with mild AI.     Carotid u/s 11/09. normal.      cholesterol medication was held last month. Lipid panel done one week ago shows total cholesterol 158, LDL 65, HDL 69, vitamin D 30, hematocrit 35, creatinine 0.85, magnesium 1.5  EKG shows normal Bradycardia with rate 53 beats per minute, no significant ST or T wave changes    Outpatient Encounter Prescriptions as of 06/06/2012  Medication Sig Dispense Refill  . Calcium Carbonate-Vitamin D (CALCIUM 600-D) 600-400  MG-UNIT per tablet Take 1 tablet by mouth daily.       . Cholecalciferol (VITAMIN D PO) Take 1 tablet by mouth. Take one 800 capsule at night       . Cholecalciferol (VITAMIN D3 SUPER STRENGTH) 2000 UNITS TABS Take 1 tablet by mouth daily at 12 noon.       . fluconazole (DIFLUCAN) 100 MG tablet Take 2 tablets by mouth on 1st day, followed by one tablet daily  22 tablet  0  . fludrocortisone (FLORINEF) 0.1 MG tablet as directed.      . hyoscyamine (LEVSIN SL) 0.125 MG SL tablet Place 0.125 mg under the tongue as needed.      . insulin glargine (LANTUS) 100 UNIT/ML injection Inject 13 Units into the skin every morning.       . insulin lispro (HUMALOG) 100 UNIT/ML injection Inject 2-9 Units into the skin 3 (three) times daily before meals. Per sliding scale.      . levothyroxine (SYNTHROID, LEVOTHROID) 150 MCG tablet Take 150 mcg by mouth See admin instructions. Take 1 tablet daily Monday through Friday      . levothyroxine (SYNTHROID, LEVOTHROID) 175 MCG tablet Take 175 mcg by mouth See admin instructions. 1 tablet daily on Saturday and Sunday      . Magnesium 250 MG TABS Take 250 mg by mouth daily.      . Multiple Vitamin (MULTIVITAMIN WITH MINERALS) TABS Take 1 tablet by mouth daily.      . mycophenolate (MYFORTIC) 360 MG TBEC Take 360 mg by mouth 2 (two) times daily.       Marland Kitchen omeprazole (PRILOSEC) 20  MG capsule Take 20 mg by mouth daily.       . Oxybutynin Chloride 10 % GEL Place 1 packet onto the skin every morning.       . phytonadione (MEPHYTON) 5 MG tablet Take 0.5 tablets (2.5 mg total) by mouth once. Now  1 tablet  0  . predniSONE (DELTASONE) 5 MG tablet Take 5 mg by mouth every morning.       . tacrolimus (PROGRAF) 1 MG capsule Take 1 mg by mouth 2 (two) times daily.      Marland Kitchen warfarin (COUMADIN) 2.5 MG tablet Take 1-2 tablets (2.5-5 mg total) by mouth daily. Take 2 tablets daily, except take 1 tablet on Thursdays.      Marland Kitchen atorvastatin (LIPITOR) 10 MG tablet Take 10 mg by mouth every other day.        Marland Kitchen DISCONTD: midodrine (PROAMATINE) 5 MG tablet Take 5 mg by mouth daily.      Marland Kitchen DISCONTD: tacrolimus (PROGRAF) 0.5 MG capsule Take 2 mg by mouth every morning. 2mg   every morning and 1mg  every evening         Review of Systems  HENT: Negative.   Eyes: Negative.   Respiratory: Negative.   Gastrointestinal: Positive for constipation.  Musculoskeletal: Positive for gait problem.  Skin: Negative.   Neurological: Positive for weakness.  Hematological: Negative.   Psychiatric/Behavioral: Negative.   All other systems reviewed and are negative.    BP 102/58  Pulse 53  Ht 5\' 3"  (1.6 m)  Wt 149 lb 4 oz (67.699 kg)  BMI 26.44 kg/m2  Physical Exam  Nursing note and vitals reviewed. Constitutional: She is oriented to person, place, and time. She appears well-developed and well-nourished.        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   HENT:  Head: Normocephalic.  Nose: Nose normal.  Mouth/Throat: Oropharynx is clear and moist.  Eyes: Conjunctivae normal are normal. Pupils are equal, round, and reactive to light.  Neck: Normal range of motion. Neck supple. No JVD present.  Cardiovascular: Normal rate, regular rhythm, S1 normal, S2 normal, normal heart sounds and intact distal pulses.  Exam reveals no gallop and no friction rub.   No murmur heard. Pulmonary/Chest: Effort normal and breath sounds normal. No respiratory distress. She has no wheezes. She has no rales. She exhibits no tenderness.  Abdominal: Soft. Bowel sounds are normal. She exhibits no distension. There is no tenderness.  Musculoskeletal: Normal range of motion. She exhibits no edema and no tenderness.  Lymphadenopathy:    She has no cervical adenopathy.  Neurological: She is alert and oriented to person, place, and time. Coordination normal.  Skin: Skin is warm and dry. No rash noted. No erythema.  Psychiatric: She has a normal mood and affect. Her behavior is normal. Judgment and thought content  normal.         Assessment and Plan

## 2012-06-06 NOTE — Assessment & Plan Note (Signed)
Asymptomatic at this time. No further workup needed

## 2012-06-06 NOTE — Assessment & Plan Note (Signed)
She appears to be relatively stable on Flori.nef every other day. Blood pressure continues to run low but she is asymptomatic

## 2012-06-07 NOTE — Telephone Encounter (Signed)
Message answered..em 

## 2012-06-12 ENCOUNTER — Telehealth: Payer: Self-pay | Admitting: Internal Medicine

## 2012-06-12 NOTE — Telephone Encounter (Signed)
Patient reports worsening pain in her esophagus, especially with swallowing and eating then she gets sick to her stomach.  Per phone note from 05/24/12 if patient did not respond to diflucan would need EGD.  I have scheduled her for [facility] on 06/21/12 11:30 to stay on coumadin as last procedure in September. Dr. Leone Payor I have reviewed her history and I don't see anything in her history that would make her an ASA 4 or .  Is it ok to keep her on for the LEC or do we need to move her to the hosp.  If she needs hosp procedure please provide some available dates.  She has a follow up with you on 06/19/12 is it ok to keep her a direct or do you want to see her?

## 2012-06-12 NOTE — Telephone Encounter (Signed)
Ok for [employer] as we discussed Cancel office visit scheduled previously

## 2012-06-13 ENCOUNTER — Ambulatory Visit (AMBULATORY_SURGERY_CENTER): Payer: Medicare Other | Admitting: *Deleted

## 2012-06-13 VITALS — Ht 64.0 in | Wt 154.0 lb

## 2012-06-13 DIAGNOSIS — R1310 Dysphagia, unspecified: Secondary | ICD-10-CM

## 2012-06-13 NOTE — Telephone Encounter (Signed)
Patient and husband aware.  They will come for pre-visit today

## 2012-06-19 ENCOUNTER — Ambulatory Visit: Payer: Medicare Other | Admitting: Internal Medicine

## 2012-06-20 ENCOUNTER — Ambulatory Visit (INDEPENDENT_AMBULATORY_CARE_PROVIDER_SITE_OTHER): Payer: Medicare Other

## 2012-06-20 DIAGNOSIS — I6350 Cerebral infarction due to unspecified occlusion or stenosis of unspecified cerebral artery: Secondary | ICD-10-CM

## 2012-06-20 DIAGNOSIS — Z7901 Long term (current) use of anticoagulants: Secondary | ICD-10-CM

## 2012-06-20 DIAGNOSIS — I639 Cerebral infarction, unspecified: Secondary | ICD-10-CM

## 2012-06-20 DIAGNOSIS — I4891 Unspecified atrial fibrillation: Secondary | ICD-10-CM

## 2012-06-20 DIAGNOSIS — I82629 Acute embolism and thrombosis of deep veins of unspecified upper extremity: Secondary | ICD-10-CM

## 2012-06-20 LAB — POCT INR: INR: 2

## 2012-06-21 ENCOUNTER — Encounter: Payer: Self-pay | Admitting: Internal Medicine

## 2012-06-21 ENCOUNTER — Ambulatory Visit (AMBULATORY_SURGERY_CENTER): Payer: Medicare Other | Admitting: Internal Medicine

## 2012-06-21 VITALS — BP 183/84 | HR 62 | Temp 98.1°F | Resp 25 | Ht 64.0 in | Wt 154.0 lb

## 2012-06-21 DIAGNOSIS — D130 Benign neoplasm of esophagus: Secondary | ICD-10-CM

## 2012-06-21 DIAGNOSIS — R1310 Dysphagia, unspecified: Secondary | ICD-10-CM

## 2012-06-21 LAB — GLUCOSE, CAPILLARY

## 2012-06-21 MED ORDER — GI COCKTAIL ~~LOC~~: 30.0000 mL | Freq: Three times a day (TID) | ORAL | Status: DC

## 2012-06-21 MED ORDER — SODIUM CHLORIDE 0.9 % IV SOLN: 500.0000 mL | INTRAVENOUS | Status: DC

## 2012-06-21 NOTE — Progress Notes (Signed)
1136  A/ox3, pleased, report to [employer]

## 2012-06-21 NOTE — Progress Notes (Addendum)
Patient did not have preoperative order for IV antibiotic SSI prophylaxis. (G8918)  Patient did not experience any of the following events: a burn prior to discharge; a fall within the facility; wrong site/side/patient/procedure/implant event; or a hospital transfer or hospital admission upon discharge from the facility. (G8907)  

## 2012-06-21 NOTE — Patient Instructions (Addendum)
The esophagus looked normal. So the Candida infection appears gone. I did take biopsies to see if there was microscopic inflammation or findings to explain things.  I will let you know.  In the meantime you can try GI cocktail before meals. You should be able to get this at the [facility] pharmacy since you have been a patient at the hospital.  Iva Boop, MD, FACG   YOU HAD AN ENDOSCOPIC PROCEDURE TODAY AT THE [facility]: Refer to the procedure report that was given to you for any specific questions about what was found during the examination.  If the procedure report does not answer your questions, please call your gastroenterologist to clarify.  If you requested that your care partner not be given the details of your procedure findings, then the procedure report has been included in a sealed envelope for you to review at your convenience later.  DIET: Your first meal following the procedure should be a light meal and then it is ok to progress to your normal diet.  A half-sandwich or bowl of soup is an example of a good first meal.  Heavy or fried foods are harder to digest and may make you feel nauseous or bloated.  Likewise meals heavy in dairy and vegetables can cause extra gas to form and this can also increase the bloating.  Drink plenty of fluids but you should avoid alcoholic beverages for 24 hours.  ACTIVITY: Your care partner should take you home directly after the procedure.  You should plan to take it easy, moving slowly for the rest of the day.  You can resume normal activity the day after the procedure however you should NOT DRIVE or use heavy machinery for 24 hours (because of the sedation medicines used during the test).    SYMPTOMS TO REPORT IMMEDIATELY: A gastroenterologist can be reached at any hour.  During normal business hours, 8:30 AM to 5:00 PM Monday through Friday, call [phone].  After hours and on weekends, please call the GI answering  service at [phone] who will take a message and have the physician on call contact you.   Following upper endoscopy (EGD)  Vomiting of blood or coffee ground material  New chest pain or pain under the shoulder blades  Painful or persistently difficult swallowing  New shortness of breath  Fever of 100F or higher  Black, tarry-looking stools  FOLLOW UP: If any biopsies were taken you will be contacted by phone or by letter within the next 1-3 weeks.  Call your gastroenterologist if you have not heard about the biopsies in 3 weeks.  Our staff will call the home number listed on your records the next business day following your procedure to check on you and address any questions or concerns that you may have at that time regarding the information given to you following your procedure. This is a courtesy call and so if there is no answer at the home number and we have not heard from you through the emergency physician on call, we will assume that you have returned to your regular daily activities without incident.  SIGNATURES/CONFIDENTIALITY: You and/or your care partner have signed paperwork which will be entered into your electronic medical record.  These signatures attest to the fact that that the information above on your After Visit Summary has been reviewed and is understood.  Full responsibility of the confidentiality of this discharge information lies with you and/or your care-partner.

## 2012-06-21 NOTE — Op Note (Signed)
[facility] [address] [place], [address]   ENDOSCOPY PROCEDURE REPORT  PATIENT: Kaden, Dunkel  MR#: [id number] BIRTHDATE: 06/05/1941 , 71  yrs. old GENDER: Female ENDOSCOPIST: Iva Boop, MD, [facility] PROCEDURE DATE:  06/21/2012 PROCEDURE:  Esophagoscopy ASA CLASS:     Class III INDICATIONS:  odynophagia.  persistent after 2 courses of fluconazole x 21 days each for Candida esophagitis at prior EGD MEDICATIONS: Propofol (Diprivan) 80 mg IV, MAC sedation, administered by CRNA, and These medications were titrated to patient response per physician's verbal order TOPICAL ANESTHETIC: none  DESCRIPTION OF PROCEDURE: After the risks benefits and alternatives of the procedure were thoroughly explained, informed consent was obtained.  The LB GIF-H180 [id number] endoscope was introduced through the mouth and advanced to the stomach body. Without limitations. The instrument was slowly withdrawn as the mucosa was fully examined.      ESOPHAGUS: The mucosa of the esophagus appeared normal.  Multiple biopsies were performed.  STOMACH: The mucosa of the stomach appeared normal.  Retroflexion was not performed.     The scope was then withdrawn from the patient and the procedure completed.  COMPLICATIONS: There were no complications. ENDOSCOPIC IMPRESSION: 1.   The mucosa of the esophagus appeared normal; multiple biopsies 2.   The mucosa of the stomach appeared normal  RECOMMENDATIONS: Office will call with results GI cocktail before meals for now If biopsies normal may need treatment directed at neuropathic causes of odynophagia    eSigned:  Iva Boop, MD, [facility] 06/21/2012 11:38 AM   MW:[id number] Arrie Aran, MD and The Patient

## 2012-06-22 ENCOUNTER — Telehealth: Payer: Self-pay | Admitting: *Deleted

## 2012-06-22 NOTE — Telephone Encounter (Signed)
  Follow up Call-  Call back number 06/21/2012  Post procedure Call Back phone  # [phone]  Permission to leave phone message Yes     Patient questions:  Do you have a fever, pain , or abdominal swelling? no Pain Score  0 *  Have you tolerated food without any problems? yes  Have you been able to return to your normal activities? yes  Do you have any questions about your discharge instructions: Diet   no Medications  no Follow up visit  no  Do you have questions or concerns about your Care? no  Actions: * If pain score is 4 or above: No action needed, pain <4.

## 2012-06-26 NOTE — Progress Notes (Signed)
Quick Note:  Negative biopsies - no infection or inflammation  I recommend she try amitriptyline 25 mg tabs take 1/2 tab qhs for 1 week then 1 tab nightly - this is to block pain signals coming from the nerve endings Let me know if she needs a renewal of GI cocktail Amitriptyline will have to build up in her system - should see some benefit by 1 month, I think See me in 2 months    [facility] No letter or recall ______

## 2012-06-29 ENCOUNTER — Other Ambulatory Visit: Payer: Self-pay

## 2012-06-29 MED ORDER — AMITRIPTYLINE HCL 25 MG PO TABS: ORAL_TABLET | ORAL | Status: DC

## 2012-07-04 ENCOUNTER — Ambulatory Visit (INDEPENDENT_AMBULATORY_CARE_PROVIDER_SITE_OTHER): Payer: Medicare Other

## 2012-07-04 DIAGNOSIS — I82629 Acute embolism and thrombosis of deep veins of unspecified upper extremity: Secondary | ICD-10-CM

## 2012-07-04 DIAGNOSIS — I639 Cerebral infarction, unspecified: Secondary | ICD-10-CM

## 2012-07-04 DIAGNOSIS — I4891 Unspecified atrial fibrillation: Secondary | ICD-10-CM

## 2012-07-04 DIAGNOSIS — I6350 Cerebral infarction due to unspecified occlusion or stenosis of unspecified cerebral artery: Secondary | ICD-10-CM

## 2012-07-04 DIAGNOSIS — Z7901 Long term (current) use of anticoagulants: Secondary | ICD-10-CM

## 2012-07-18 ENCOUNTER — Ambulatory Visit (INDEPENDENT_AMBULATORY_CARE_PROVIDER_SITE_OTHER): Payer: Medicare Other

## 2012-07-18 DIAGNOSIS — I82629 Acute embolism and thrombosis of deep veins of unspecified upper extremity: Secondary | ICD-10-CM

## 2012-07-18 DIAGNOSIS — Z7901 Long term (current) use of anticoagulants: Secondary | ICD-10-CM

## 2012-07-18 DIAGNOSIS — I6350 Cerebral infarction due to unspecified occlusion or stenosis of unspecified cerebral artery: Secondary | ICD-10-CM

## 2012-07-18 DIAGNOSIS — I4891 Unspecified atrial fibrillation: Secondary | ICD-10-CM

## 2012-07-18 DIAGNOSIS — I639 Cerebral infarction, unspecified: Secondary | ICD-10-CM

## 2012-08-01 ENCOUNTER — Ambulatory Visit (INDEPENDENT_AMBULATORY_CARE_PROVIDER_SITE_OTHER): Payer: Medicare Other

## 2012-08-01 DIAGNOSIS — Z7901 Long term (current) use of anticoagulants: Secondary | ICD-10-CM

## 2012-08-01 DIAGNOSIS — I6350 Cerebral infarction due to unspecified occlusion or stenosis of unspecified cerebral artery: Secondary | ICD-10-CM

## 2012-08-01 DIAGNOSIS — I82629 Acute embolism and thrombosis of deep veins of unspecified upper extremity: Secondary | ICD-10-CM

## 2012-08-01 DIAGNOSIS — I4891 Unspecified atrial fibrillation: Secondary | ICD-10-CM

## 2012-08-01 DIAGNOSIS — I639 Cerebral infarction, unspecified: Secondary | ICD-10-CM

## 2012-08-01 LAB — POCT INR: INR: 2.5

## 2012-08-01 MED ORDER — COUMADIN 2.5 MG PO TABS: ORAL_TABLET | ORAL | Status: DC

## 2012-08-29 ENCOUNTER — Ambulatory Visit (INDEPENDENT_AMBULATORY_CARE_PROVIDER_SITE_OTHER): Payer: Medicare Other

## 2012-08-29 DIAGNOSIS — I4891 Unspecified atrial fibrillation: Secondary | ICD-10-CM

## 2012-08-29 DIAGNOSIS — I639 Cerebral infarction, unspecified: Secondary | ICD-10-CM

## 2012-08-29 DIAGNOSIS — Z7901 Long term (current) use of anticoagulants: Secondary | ICD-10-CM

## 2012-08-29 DIAGNOSIS — I6350 Cerebral infarction due to unspecified occlusion or stenosis of unspecified cerebral artery: Secondary | ICD-10-CM

## 2012-08-29 DIAGNOSIS — I82629 Acute embolism and thrombosis of deep veins of unspecified upper extremity: Secondary | ICD-10-CM

## 2012-09-04 ENCOUNTER — Ambulatory Visit (INDEPENDENT_AMBULATORY_CARE_PROVIDER_SITE_OTHER): Payer: Medicare Other | Admitting: Internal Medicine

## 2012-09-04 ENCOUNTER — Encounter: Payer: Self-pay | Admitting: Internal Medicine

## 2012-09-04 VITALS — BP 114/68 | HR 60 | Ht 61.5 in | Wt 146.0 lb

## 2012-09-04 DIAGNOSIS — R1310 Dysphagia, unspecified: Secondary | ICD-10-CM

## 2012-09-04 DIAGNOSIS — M792 Neuralgia and neuritis, unspecified: Secondary | ICD-10-CM | POA: Insufficient documentation

## 2012-09-04 DIAGNOSIS — IMO0002 Reserved for concepts with insufficient information to code with codable children: Secondary | ICD-10-CM

## 2012-09-04 HISTORY — DX: Neuralgia and neuritis, unspecified: M79.2

## 2012-09-04 MED ORDER — AMITRIPTYLINE HCL 25 MG PO TABS: 25.0000 mg | ORAL_TABLET | Freq: Every day | ORAL | Status: DC

## 2012-09-04 NOTE — Progress Notes (Signed)
  Subjective:    Patient ID: Elizabeth [name], female    DOB: June 27, 1941, 72 y.o.   MRN: [id number]  HPI The patient returns in followup with her husband. She is doing much better on amitriptyline 25 mg at bedtime. She's been on for about 6 weeks. She still has intermittent problems with her swallowing but it's not daily and persistent. Her husband thinks she is much better overall. She is sleeping at night as well, and has been able to stop her overactive bladder medication now but she is on amitriptyline at night.  Medications, allergies, past medical history, past surgical history, family history and social history are reviewed and updated in the EMR.  Review of Systems As above    Objective:   Physical Exam Elderly white woman in wheelchair, no acute distress       Assessment & Plan:   1. Odynophagia   2. Neurogenic pain-esophagus    1. This is as well as she's not in some time. The amitriptyline is helping her neurogenic esophageal pain and her insomnia as well as her bladder urgency that was bothering her at night. 2. She will continue this, see me in 1 year, sooner as needed.  CC: COLADONATO,JOSEPH A, MD

## 2012-09-04 NOTE — Patient Instructions (Addendum)
We have sent the following medications to your pharmacy for you to pick up at your convenience: Amitriptyline  Follow up with [place] in a year or sooner if needed.  Thank you for choosing me and [facility].  Iva Boop, M.D., [facility]

## 2012-09-12 ENCOUNTER — Ambulatory Visit (INDEPENDENT_AMBULATORY_CARE_PROVIDER_SITE_OTHER): Payer: Medicare Other

## 2012-09-12 DIAGNOSIS — I639 Cerebral infarction, unspecified: Secondary | ICD-10-CM

## 2012-09-12 DIAGNOSIS — I6350 Cerebral infarction due to unspecified occlusion or stenosis of unspecified cerebral artery: Secondary | ICD-10-CM

## 2012-09-12 DIAGNOSIS — I82629 Acute embolism and thrombosis of deep veins of unspecified upper extremity: Secondary | ICD-10-CM

## 2012-09-12 DIAGNOSIS — Z7901 Long term (current) use of anticoagulants: Secondary | ICD-10-CM

## 2012-09-12 DIAGNOSIS — I4891 Unspecified atrial fibrillation: Secondary | ICD-10-CM

## 2012-09-12 LAB — POCT INR: INR: 2.7

## 2012-09-20 ENCOUNTER — Emergency Department (HOSPITAL_COMMUNITY): Payer: Medicare Other

## 2012-09-20 ENCOUNTER — Encounter (HOSPITAL_COMMUNITY): Payer: Self-pay | Admitting: Emergency Medicine

## 2012-09-20 ENCOUNTER — Inpatient Hospital Stay (HOSPITAL_COMMUNITY)
Admission: EM | Admit: 2012-09-20 | Discharge: 2012-09-28 | DRG: 085 | Disposition: A | Discharge status: Rehabilitation Facility | Payer: Medicare Other | Attending: General Surgery | Admitting: General Surgery

## 2012-09-20 DIAGNOSIS — R410 Disorientation, unspecified: Secondary | ICD-10-CM

## 2012-09-20 DIAGNOSIS — W19XXXA Unspecified fall, initial encounter: Secondary | ICD-10-CM | POA: Diagnosis present

## 2012-09-20 DIAGNOSIS — S0280XA Fracture of other specified skull and facial bones, unspecified side, initial encounter for closed fracture: Secondary | ICD-10-CM | POA: Diagnosis present

## 2012-09-20 DIAGNOSIS — Z940 Kidney transplant status: Secondary | ICD-10-CM

## 2012-09-20 DIAGNOSIS — IMO0002 Reserved for concepts with insufficient information to code with codable children: Secondary | ICD-10-CM | POA: Diagnosis present

## 2012-09-20 DIAGNOSIS — E785 Hyperlipidemia, unspecified: Secondary | ICD-10-CM | POA: Diagnosis present

## 2012-09-20 DIAGNOSIS — M810 Age-related osteoporosis without current pathological fracture: Secondary | ICD-10-CM | POA: Diagnosis present

## 2012-09-20 DIAGNOSIS — G9340 Encephalopathy, unspecified: Secondary | ICD-10-CM | POA: Diagnosis present

## 2012-09-20 DIAGNOSIS — S0240CA Maxillary fracture, right side, initial encounter for closed fracture: Secondary | ICD-10-CM | POA: Diagnosis present

## 2012-09-20 DIAGNOSIS — S02401A Maxillary fracture, unspecified, initial encounter for closed fracture: Secondary | ICD-10-CM | POA: Diagnosis present

## 2012-09-20 DIAGNOSIS — S02400A Malar fracture unspecified, initial encounter for closed fracture: Secondary | ICD-10-CM | POA: Diagnosis present

## 2012-09-20 DIAGNOSIS — K219 Gastro-esophageal reflux disease without esophagitis: Secondary | ICD-10-CM | POA: Diagnosis present

## 2012-09-20 DIAGNOSIS — F0390 Unspecified dementia without behavioral disturbance: Secondary | ICD-10-CM | POA: Diagnosis present

## 2012-09-20 DIAGNOSIS — I4891 Unspecified atrial fibrillation: Secondary | ICD-10-CM

## 2012-09-20 DIAGNOSIS — D136 Benign neoplasm of pancreas: Secondary | ICD-10-CM

## 2012-09-20 DIAGNOSIS — S060X9A Concussion with loss of consciousness of unspecified duration, initial encounter: Secondary | ICD-10-CM

## 2012-09-20 DIAGNOSIS — R42 Dizziness and giddiness: Secondary | ICD-10-CM

## 2012-09-20 DIAGNOSIS — N390 Urinary tract infection, site not specified: Secondary | ICD-10-CM | POA: Diagnosis present

## 2012-09-20 DIAGNOSIS — Z951 Presence of aortocoronary bypass graft: Secondary | ICD-10-CM

## 2012-09-20 DIAGNOSIS — E162 Hypoglycemia, unspecified: Secondary | ICD-10-CM

## 2012-09-20 DIAGNOSIS — R109 Unspecified abdominal pain: Secondary | ICD-10-CM

## 2012-09-20 DIAGNOSIS — Z7901 Long term (current) use of anticoagulants: Secondary | ICD-10-CM

## 2012-09-20 DIAGNOSIS — E039 Hypothyroidism, unspecified: Secondary | ICD-10-CM | POA: Diagnosis present

## 2012-09-20 DIAGNOSIS — E1169 Type 2 diabetes mellitus with other specified complication: Secondary | ICD-10-CM | POA: Diagnosis present

## 2012-09-20 DIAGNOSIS — E119 Type 2 diabetes mellitus without complications: Secondary | ICD-10-CM

## 2012-09-20 DIAGNOSIS — I2510 Atherosclerotic heart disease of native coronary artery without angina pectoris: Secondary | ICD-10-CM | POA: Diagnosis present

## 2012-09-20 DIAGNOSIS — Z8673 Personal history of transient ischemic attack (TIA), and cerebral infarction without residual deficits: Secondary | ICD-10-CM

## 2012-09-20 DIAGNOSIS — S060XAA Concussion with loss of consciousness status unknown, initial encounter: Secondary | ICD-10-CM | POA: Diagnosis present

## 2012-09-20 DIAGNOSIS — S0285XA Fracture of orbit, unspecified, initial encounter for closed fracture: Secondary | ICD-10-CM | POA: Diagnosis present

## 2012-09-20 DIAGNOSIS — S02402A Zygomatic fracture, unspecified, initial encounter for closed fracture: Secondary | ICD-10-CM | POA: Diagnosis present

## 2012-09-20 DIAGNOSIS — E1122 Type 2 diabetes mellitus with diabetic chronic kidney disease: Secondary | ICD-10-CM | POA: Diagnosis present

## 2012-09-20 DIAGNOSIS — S02109A Fracture of base of skull, unspecified side, initial encounter for closed fracture: Principal | ICD-10-CM | POA: Diagnosis present

## 2012-09-20 DIAGNOSIS — R55 Syncope and collapse: Secondary | ICD-10-CM | POA: Diagnosis present

## 2012-09-20 DIAGNOSIS — M792 Neuralgia and neuritis, unspecified: Secondary | ICD-10-CM

## 2012-09-20 DIAGNOSIS — I951 Orthostatic hypotension: Secondary | ICD-10-CM

## 2012-09-20 LAB — COMPREHENSIVE METABOLIC PANEL
ALT: 16 U/L (ref 0–35)
AST: 27 U/L (ref 0–37)
Albumin: 3.6 g/dL (ref 3.5–5.2)
CO2: 25 mEq/L (ref 19–32)
Calcium: 9.9 mg/dL (ref 8.4–10.5)
GFR calc non Af Amer: 69 mL/min — ABNORMAL LOW (ref >90)
Sodium: 137 mEq/L (ref 135–145)
Total Protein: 6.3 g/dL (ref 6.0–8.3)

## 2012-09-20 LAB — CBC
HCT: 39.2 % (ref 36.0–46.0)
Hemoglobin: 12.9 g/dL (ref 12.0–15.0)
MCH: 29.7 pg (ref 26.0–34.0)
MCHC: 32.9 g/dL (ref 30.0–36.0)
MCV: 90.1 fL (ref 78.0–100.0)
RBC: 4.35 MIL/uL (ref 3.87–5.11)

## 2012-09-20 LAB — TYPE AND SCREEN
ABO/RH(D): O POS
Antibody Screen: NEGATIVE

## 2012-09-20 LAB — URINALYSIS, ROUTINE W REFLEX MICROSCOPIC
Ketones, ur: NEGATIVE mg/dL
Protein, ur: NEGATIVE mg/dL
Urobilinogen, UA: 0.2 mg/dL (ref 0.0–1.0)

## 2012-09-20 LAB — CK TOTAL AND CKMB (NOT AT ARMC): Total CK: 114 U/L (ref 7–177)

## 2012-09-20 LAB — POCT I-STAT, CHEM 8
BUN: 23 mg/dL (ref 6–23)
Calcium, Ion: 1.26 mmol/L (ref 1.13–1.30)
Chloride: 101 mEq/L (ref 96–112)
Creatinine, Ser: 1 mg/dL (ref 0.50–1.10)
TCO2: 29 mmol/L (ref 0–100)

## 2012-09-20 LAB — CG4 I-STAT (LACTIC ACID): Lactic Acid, Venous: 2.27 mmol/L — ABNORMAL HIGH (ref 0.5–2.2)

## 2012-09-20 LAB — RAPID URINE DRUG SCREEN, HOSP PERFORMED
Barbiturates: NOT DETECTED
Tetrahydrocannabinol: NOT DETECTED

## 2012-09-20 LAB — CDS SEROLOGY

## 2012-09-20 LAB — URINE MICROSCOPIC-ADD ON

## 2012-09-20 LAB — ETHANOL: Alcohol, Ethyl (B): <11 mg/dL (ref 0–11)

## 2012-09-20 LAB — TROPONIN I: Troponin I: <0.3 ng/mL (ref <0.30)

## 2012-09-20 LAB — GLUCOSE, CAPILLARY: Glucose-Capillary: 162 mg/dL — ABNORMAL HIGH (ref 70–99)

## 2012-09-20 MED ORDER — HYDROCODONE-ACETAMINOPHEN 5-325 MG PO TABS: 1.0000 | ORAL_TABLET | ORAL | Status: DC | PRN

## 2012-09-20 MED ORDER — CEFAZOLIN SODIUM 1-5 GM-% IV SOLN
1.0000 g | Freq: Three times a day (TID) | INTRAVENOUS | Status: DC
  Administered 2012-09-21 – 2012-09-22 (×5): 1 g via INTRAVENOUS
  Filled 2012-09-20 (×8): qty 50

## 2012-09-20 MED ORDER — SODIUM CHLORIDE 0.9 % IV SOLN
INTRAVENOUS | Status: DC
  Administered 2012-09-20: 20:00:00 via INTRAVENOUS

## 2012-09-20 MED ORDER — FENTANYL CITRATE 0.05 MG/ML IJ SOLN
INTRAMUSCULAR | Status: AC
  Filled 2012-09-20: qty 2

## 2012-09-20 MED ORDER — LEVOTHYROXINE SODIUM 175 MCG PO TABS
175.0000 ug | ORAL_TABLET | ORAL | Status: DC
  Administered 2012-09-22 – 2012-09-23 (×2): 175 ug via ORAL
  Filled 2012-09-20 (×3): qty 1

## 2012-09-20 MED ORDER — HALOPERIDOL LACTATE 5 MG/ML IJ SOLN
INTRAMUSCULAR | Status: AC
  Filled 2012-09-20: qty 1

## 2012-09-20 MED ORDER — ACETAMINOPHEN 325 MG PO TABS
650.0000 mg | ORAL_TABLET | ORAL | Status: DC | PRN
  Administered 2012-09-25 – 2012-09-27 (×4): 650 mg via ORAL
  Filled 2012-09-20 (×4): qty 2

## 2012-09-20 MED ORDER — PREDNISONE 5 MG PO TABS
5.0000 mg | ORAL_TABLET | Freq: Every morning | ORAL | Status: DC
  Administered 2012-09-21 – 2012-09-28 (×8): 5 mg via ORAL
  Filled 2012-09-20 (×8): qty 1

## 2012-09-20 MED ORDER — LEVOTHYROXINE SODIUM 150 MCG PO TABS
150.0000 ug | ORAL_TABLET | ORAL | Status: DC
  Administered 2012-09-21 – 2012-09-28 (×6): 150 ug via ORAL
  Filled 2012-09-20 (×7): qty 1

## 2012-09-20 MED ORDER — MORPHINE SULFATE 2 MG/ML IJ SOLN
1.0000 mg | INTRAMUSCULAR | Status: DC | PRN
  Administered 2012-09-21 – 2012-09-23 (×9): 1 mg via INTRAVENOUS
  Filled 2012-09-20 (×5): qty 1

## 2012-09-20 MED ORDER — INSULIN ASPART 100 UNIT/ML ~~LOC~~ SOLN
0.0000 [IU] | SUBCUTANEOUS | Status: DC
  Administered 2012-09-21: 2 [IU] via SUBCUTANEOUS
  Administered 2012-09-21: 5 [IU] via SUBCUTANEOUS

## 2012-09-20 MED ORDER — MYCOPHENOLATE SODIUM 180 MG PO TBEC
360.0000 mg | DELAYED_RELEASE_TABLET | Freq: Two times a day (BID) | ORAL | Status: DC
  Administered 2012-09-21 – 2012-09-28 (×16): 360 mg via ORAL
  Filled 2012-09-20 (×23): qty 2

## 2012-09-20 MED ORDER — TACROLIMUS 1 MG PO CAPS
1.0000 mg | ORAL_CAPSULE | Freq: Every day | ORAL | Status: DC
  Administered 2012-09-21 – 2012-09-27 (×8): 1 mg via ORAL
  Filled 2012-09-20 (×9): qty 1

## 2012-09-20 MED ORDER — HYDROCODONE-ACETAMINOPHEN 5-325 MG PO TABS
2.0000 | ORAL_TABLET | ORAL | Status: DC | PRN
  Administered 2012-09-23 – 2012-09-24 (×3): 1 via ORAL
  Filled 2012-09-20: qty 1
  Filled 2012-09-20: qty 2
  Filled 2012-09-20 (×2): qty 1

## 2012-09-20 MED ORDER — MORPHINE SULFATE 2 MG/ML IJ SOLN
2.0000 mg | INTRAMUSCULAR | Status: DC | PRN
  Administered 2012-09-20: 2 mg via INTRAVENOUS

## 2012-09-20 MED ORDER — ONDANSETRON HCL 4 MG PO TABS
4.0000 mg | ORAL_TABLET | Freq: Four times a day (QID) | ORAL | Status: DC | PRN
  Filled 2012-09-20: qty 1

## 2012-09-20 MED ORDER — FENTANYL CITRATE 0.05 MG/ML IJ SOLN
INTRAMUSCULAR | Status: AC
  Administered 2012-09-20: 50 ug via INTRAVENOUS
  Filled 2012-09-20: qty 2

## 2012-09-20 MED ORDER — PANTOPRAZOLE SODIUM 40 MG IV SOLR
40.0000 mg | Freq: Every day | INTRAVENOUS | Status: DC
  Administered 2012-09-21 – 2012-09-23 (×3): 40 mg via INTRAVENOUS
  Filled 2012-09-20 (×4): qty 40

## 2012-09-20 MED ORDER — MORPHINE SULFATE 2 MG/ML IJ SOLN
1.0000 mg | INTRAMUSCULAR | Status: DC | PRN
  Filled 2012-09-20 (×4): qty 1

## 2012-09-20 MED ORDER — SODIUM CHLORIDE 0.9 % IV BOLUS (SEPSIS)
500.0000 mL | Freq: Once | INTRAVENOUS | Status: AC
  Administered 2012-09-20: 500 mL via INTRAVENOUS

## 2012-09-20 MED ORDER — BACITRACIN-NEOMYCIN-POLYMYXIN OINTMENT TUBE
TOPICAL_OINTMENT | Freq: Every day | CUTANEOUS | Status: DC
  Administered 2012-09-21 – 2012-09-23 (×3): 1 via TOPICAL
  Administered 2012-09-24 – 2012-09-27 (×2): via TOPICAL
  Filled 2012-09-20 (×2): qty 15

## 2012-09-20 MED ORDER — TACROLIMUS 1 MG PO CAPS
2.0000 mg | ORAL_CAPSULE | Freq: Every day | ORAL | Status: DC
  Administered 2012-09-21 – 2012-09-28 (×8): 2 mg via ORAL
  Filled 2012-09-20 (×8): qty 2

## 2012-09-20 MED ORDER — PANTOPRAZOLE SODIUM 40 MG PO TBEC
40.0000 mg | DELAYED_RELEASE_TABLET | Freq: Every day | ORAL | Status: DC
  Administered 2012-09-24 – 2012-09-28 (×5): 40 mg via ORAL
  Filled 2012-09-20 (×5): qty 1

## 2012-09-20 MED ORDER — FLUDROCORTISONE ACETATE 0.1 MG PO TABS
0.1000 mg | ORAL_TABLET | ORAL | Status: DC
  Administered 2012-09-21 – 2012-09-27 (×4): 0.1 mg via ORAL
  Filled 2012-09-20 (×4): qty 1

## 2012-09-20 MED ORDER — ONDANSETRON HCL 4 MG/2ML IJ SOLN
4.0000 mg | Freq: Four times a day (QID) | INTRAMUSCULAR | Status: DC | PRN
  Administered 2012-09-22 – 2012-09-24 (×2): 4 mg via INTRAVENOUS
  Filled 2012-09-20 (×2): qty 2

## 2012-09-20 MED ORDER — ONDANSETRON HCL 4 MG/2ML IJ SOLN: 4.0000 mg | Freq: Once | INTRAMUSCULAR | Status: DC

## 2012-09-20 NOTE — ED Provider Notes (Signed)
History  This chart was scribed for Glynn Octave, MD by Shari Heritage, ED Scribe. The patient was seen in room B15C/B15C. Patient's care was started at 1658.  CSN: [id number]  Arrival date & time 09/20/12  1657   First MD Initiated Contact with Patient 09/20/12 1658      Chief Complaint  Patient presents with  . Fall     The history is provided by the patient. No language interpreter was used.    HPI Comments: Elizabeth [name] is a 72 y.o. female who presents to the Emergency Department complaining of moderate, constant, diffuse, non-radiating neck pain resulting from a fall. Patient also has dried blood on her face and reports diffuse, constant, moderate to severe body pain. The fall occurred sometime between 2-4 pm this afternoon. Patient was last seen at 2 pm next to her walker. Per EMS, she likely fell down and the walker struck her in the abdomen. Patient had one episode of hematemesis at the scene. CBG was 47 on the scene and was 158 upon recheck after D10 from EMS. Patient was given zofran 4 mg en route. Patietn lives alone in a private residence. She has a medical history of coronary artery disease, chronic kidney disease, diabetes, CVA, and neuropathy.  Past Medical History  Diagnosis Date  . Coronary artery disease   . Chronic kidney disease     s/p cadaveric renal transplant in 2001  . Diabetes mellitus   . PVD (peripheral vascular disease)   . History of orthostatic hypotension   . Hyperlipidemia   . Hypothyroidism   . Anemia   . Pancreatic cyst   . Osteoporosis     recurrent fractures  . Melanoma   . Carotid bruit     left  . Adenomatous polyp   . GERD (gastroesophageal reflux disease)   . Gastritis   . CVA (cerebral infarction)   . Bradycardia   . Renal artery stenosis   . Diabetic retinopathy(362.0)   . Renal osteodystrophy   . Hyperparathyroidism   . Leg ulcer     from poor fitting prosthetic  . H/O immunosuppressive therapy   . 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 Fx wrist   . Fx ankle   . Neuropathy   . Nephropathy   . Carotid artery stenosis     mild  . Stroke   . Tubular adenoma   . Colitis   . IBS (irritable bowel syndrome)   . Ischemic colitis   . 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 Esophageal candidiasis   . Neurogenic pain-esophagus 09/04/2012    Past Surgical History  Procedure Date  . Kidney transplant 08/15/2000    [facility] in [place]  . 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 Appendectomy   . Cataract extraction   . Cholecystectomy   . Abdominal hysterectomy   . Thyroid surgery   . Tonsillectomy   . Cesarean section     x 2  . Vitrectomy     right eye  . Coronary artery bypass graft   . Colonoscopy w/ biopsies and polypectomy 02/18/2009    adenomatous polyps, diverticulosis, internal hemorrhoids  . Eus 02/04/2010    w/FNA, pancreatic cyst  . Upper gastrointestinal endoscopy 12/01/2009  . Colonoscopy 01/30/2012    Procedure: COLONOSCOPY;  Surgeon: Reuel Boom  Marye Round, MD;  Location: [facility] ENDOSCOPY;  Service: Endoscopy;  Laterality: N/A;  . Esophagogastroduodenoscopy 05/02/2012    Procedure: ESOPHAGOGASTRODUODENOSCOPY (EGD);  Surgeon: Iva Boop, MD;  Location: [facility] ENDOSCOPY;  Service: Endoscopy;  Laterality: N/A;    Family History  Problem Relation Age of Onset  . Stroke Mother 7  . Heart attack Father 69  . Colon cancer Neg Hx   . Diabetes Neg Hx     History  Substance Use Topics  . Smoking status: Never Smoker   . Smokeless tobacco: Never Used  . Alcohol Use: No    OB History    Grav Para Term Preterm Abortions TAB SAB Ect Mult Living                  Review of Systems A complete 10 system review of systems was obtained and all systems are negative except as noted in the HPI and PMH.   Allergies  Percocet  Home Medications   No current  outpatient prescriptions on file.  Triage Vitals: BP 161/84  Resp 20  SpO2 100%  Physical Exam  Constitutional: She appears well-developed and well-nourished.       Elderly female in apparent discomfort. Patient fitted with back board and c-collar.  HENT:  Head: Normocephalic.  Mouth/Throat: Mucous membranes are normal.       Dried blood to the face. Dried blood in the oropharynx.   Eyes: Conjunctivae normal and EOM are normal. Right pupil is not reactive. Left pupil is not reactive. Pupils are unequal.       Both pupils irregular and non reactive.   Neck:       Patient arrived in C-collar. Patient complains of diffuse neck pain.  Cardiovascular: Normal rate, regular rhythm and normal heart sounds.   No murmur heard. Pulmonary/Chest: Effort normal. No respiratory distress.       Equal but coarse breath sounds throughout.   Abdominal: Soft. There is tenderness.       Lower abdomen hematoma. Diffusely tender to palpation.  Musculoskeletal: She exhibits no edema.       Right hip pain. Left BKA. Palpable pulse in right lower extremity. Moving all extremities.  Neurological: She is alert. GCS eye subscore is 4. GCS verbal subscore is 4. GCS motor subscore is 6.       Responds to commands but confused.  Skin: Skin is warm.    ED Course  Procedures (including critical care time) DIAGNOSTIC STUDIES: Oxygen Saturation is 100% on room air, normal by my interpretation.    COORDINATION OF CARE: 5:23 PM- Patient informed of current plan for treatment and evaluation and agrees with plan at this time.  Results for orders placed during the hospital encounter of 09/20/12  CDS SEROLOGY      Component Value Range   CDS serology specimen       Value: SPECIMEN WILL BE HELD FOR 14 DAYS IF TESTING IS REQUIRED  COMPREHENSIVE METABOLIC PANEL      Component Value Range   Sodium 137  135 - 145 mEq/L   Potassium 4.1  3.5 - 5.1 mEq/L   Chloride 100  96 - 112 mEq/L   CO2 25  19 - 32 mEq/L    Glucose, Bld 220 (*) 70 - 99 mg/dL   BUN 21  6 - 23 mg/dL   Creatinine, Ser 4.09  0.50 - 1.10 mg/dL   Calcium 9.9  8.4 - 81.1 mg/dL   Total Protein 6.3  6.0 - 8.3  g/dL   Albumin 3.6  3.5 - 5.2 g/dL   AST 27  0 - 37 U/L   ALT 16  0 - 35 U/L   Alkaline Phosphatase 63  39 - 117 U/L   Total Bilirubin 0.5  0.3 - 1.2 mg/dL   GFR calc non Af Amer 69 (*) >90 mL/min   GFR calc Af Amer 80 (*) >90 mL/min  CBC      Component Value Range   WBC 10.0  4.0 - 10.5 K/uL   RBC 4.35  3.87 - 5.11 MIL/uL   Hemoglobin 12.9  12.0 - 15.0 g/dL   HCT 16.1  09.6 - 04.5 %   MCV 90.1  78.0 - 100.0 fL   MCH 29.7  26.0 - 34.0 pg   MCHC 32.9  30.0 - 36.0 g/dL   RDW 40.9  81.1 - 91.4 %   Platelets 167  150 - 400 K/uL  PROTIME-INR      Component Value Range   Prothrombin Time 22.2 (*) 11.6 - 15.2 seconds   INR 2.04 (*) 0.00 - 1.49  APTT      Component Value Range   aPTT 37  24 - 37 seconds  TROPONIN I      Component Value Range   Troponin I <0.30  <0.30 ng/mL  TYPE AND SCREEN      Component Value Range   ABO/RH(D) O POS     Antibody Screen NEG     Sample Expiration 09/23/2012    URINALYSIS, ROUTINE W REFLEX MICROSCOPIC      Component Value Range   Color, Urine YELLOW  YELLOW   APPearance CLOUDY (*) CLEAR   Specific Gravity, Urine 1.017  1.005 - 1.030   pH 6.5  5.0 - 8.0   Glucose, UA NEGATIVE  NEGATIVE mg/dL   Hgb urine dipstick NEGATIVE  NEGATIVE   Bilirubin Urine NEGATIVE  NEGATIVE   Ketones, ur NEGATIVE  NEGATIVE mg/dL   Protein, ur NEGATIVE  NEGATIVE mg/dL   Urobilinogen, UA 0.2  0.0 - 1.0 mg/dL   Nitrite NEGATIVE  NEGATIVE   Leukocytes, UA SMALL (*) NEGATIVE  CK TOTAL AND CKMB      Component Value Range   Total CK 114  7 - 177 U/L   CK, MB 3.2  0.3 - 4.0 ng/mL   Relative Index 2.8 (*) 0.0 - 2.5  ETHANOL      Component Value Range   Alcohol, Ethyl (B) <11  0 - 11 mg/dL  POCT I-STAT, CHEM 8      Component Value Range   Sodium 137  135 - 145 mEq/L   Potassium 4.0  3.5 - 5.1 mEq/L    Chloride 101  96 - 112 mEq/L   BUN 23  6 - 23 mg/dL   Creatinine, Ser 7.82  0.50 - 1.10 mg/dL   Glucose, Bld 956 (*) 70 - 99 mg/dL   Calcium, Ion 2.13  0.86 - 1.30 mmol/L   TCO2 29  0 - 100 mmol/L   Hemoglobin 13.3  12.0 - 15.0 g/dL   HCT 57.8  46.9 - 62.9 %  CG4 I-STAT (LACTIC ACID)      Component Value Range   Lactic Acid, Venous 2.27 (*) 0.5 - 2.2 mmol/L  GLUCOSE, CAPILLARY      Component Value Range   Glucose-Capillary 162 (*) 70 - 99 mg/dL  URINE RAPID DRUG SCREEN (HOSP PERFORMED)      Component Value Range   Opiates NONE DETECTED  NONE DETECTED   Cocaine NONE DETECTED  NONE DETECTED   Benzodiazepines NONE DETECTED  NONE DETECTED   Amphetamines NONE DETECTED  NONE DETECTED   Tetrahydrocannabinol NONE DETECTED  NONE DETECTED   Barbiturates NONE DETECTED  NONE DETECTED  URINE MICROSCOPIC-ADD ON      Component Value Range   Squamous Epithelial / LPF MANY (*) RARE   WBC, UA 0-2  <3 WBC/hpf   RBC / HPF 0-2  <3 RBC/hpf   Bacteria, UA RARE  RARE   Urine-Other AMORPHOUS URATES/PHOSPHATES    MRSA PCR SCREENING      Component Value Range   MRSA by PCR NEGATIVE  NEGATIVE     Ct Abdomen Pelvis Wo Contrast  09/20/2012  *RADIOLOGY REPORT*  Clinical Data:  Fall, on Coumadin.  Right lower quadrant pain.  CT CHEST, ABDOMEN AND PELVIS WITHOUT CONTRAST  Technique:  Multidetector CT imaging of the chest, abdomen and pelvis was performed following the standard protocol without IV contrast.  Comparison:  01/27/2012.  CT CHEST  Findings:  Prior CABG.  Densely calcified mitral valve.  Heart is borderline in size.  Aorta is heavily calcified, non-aneurysmal.  Esophagus is dilated with air-fluid level in the upper to mid thoracic esophagus.  This may be related to dysmotility or reflux.  No mediastinal, hilar, or axillary adenopathy.  Visualized thyroid and chest wall soft tissues unremarkable.  Linear scarring or atelectasis in the left base.  Lungs otherwise clear.  No effusions.  No acute bony  abnormality.  IMPRESSION: No acute findings in the chest.  CT ABDOMEN AND PELVIS  Findings:   Densely calcified aorta, iliac vessels and branch vessels.  Kidneys are severely atrophic.  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  Mild fullness of the renal collecting system.  Urinary bladder is grossly unremarkable.  Moderate stool burden throughout the colon.  Liver, spleen, pancreas, adrenals have an unremarkable unenhanced appearance. Small bowel is decompressed.  No free fluid, free air or adenopathy.  Prior hysterectomy.  No adnexal masses. no evidence of abdominal or pelvic wall hematoma. No acute bony abnormality.  IMPRESSION: No acute findings in the abdomen or pelvis.   Original Report Authenticated By: Charlett Nose, M.D.    Ct Head Wo Contrast  09/20/2012  *RADIOLOGY REPORT*  Clinical Data:  Fall.  On Coumadin.  Right facial swelling.  CT HEAD WITHOUT CONTRAST CT MAXILLOFACIAL WITHOUT CONTRAST CT CERVICAL SPINE WITHOUT CONTRAST  Technique:  Multidetector CT imaging of the head, cervical spine, and maxillofacial structures were performed using the standard protocol without intravenous contrast. Multiplanar CT image reconstructions of the cervical spine and maxillofacial structures were also generated.  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  Old left cerebellar infarct.  No acute infarction. No hemorrhage or hydrocephalus.  No acute calvarial abnormality.  IMPRESSION: No acute intracranial abnormality.  CT MAXILLOFACIAL  Findings:  Fractures noted through the anterior and lateral walls of the right maxillary sinus.  Blood within the maxillary sinus. The fracture extends through the inferior orbital rim.  Two fractures are noted within the mid and posterior right zygomatic arch.  Fracture or diastasis in the lateral right orbital wall.  Mandible is intact.  Blood  noted within the right paranasal sinuses.  Soft tissue swelling within the left face.  Orbital soft tissues grossly unremarkable.  IMPRESSION: Fractures through the anterior and lateral walls of the right  maxillary sinus, extending to involve the right inferior orbital rim.  Fractures within the right zygomatic arch and right lateral orbital wall.  Blood throughout the right paranasal sinuses.  CT CERVICAL SPINE  Findings:   Normal alignment.  Prevertebral soft tissues are normal.  Disc spaces are maintained.  No fracture.  No epidural or paraspinal hematoma.  Densely calcified vessels throughout the neck.  IMPRESSION: No acute bony abnormality.   Original Report Authenticated By: Charlett Nose, M.D.    Ct Chest Wo Contrast  09/20/2012  *RADIOLOGY REPORT*  Clinical Data:  Fall, on Coumadin.  Right lower quadrant pain.  CT CHEST, ABDOMEN AND PELVIS WITHOUT CONTRAST  Technique:  Multidetector CT imaging of the chest, abdomen and pelvis was performed following the standard protocol without IV contrast.  Comparison:  01/27/2012.  CT CHEST  Findings:  Prior CABG.  Densely calcified mitral valve.  Heart is borderline in size.  Aorta is heavily calcified, non-aneurysmal.  Esophagus is dilated with air-fluid level in the upper to mid thoracic esophagus.  This may be related to dysmotility or reflux.  No mediastinal, hilar, or axillary adenopathy.  Visualized thyroid and chest wall soft tissues unremarkable.  Linear scarring or atelectasis in the left base.  Lungs otherwise clear.  No effusions.  No acute bony abnormality.  IMPRESSION: No acute findings in the chest.  CT ABDOMEN AND PELVIS  Findings:   Densely calcified aorta, iliac vessels and branch vessels.  Kidneys are severely atrophic.  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  Mild fullness of the renal collecting system.  Urinary bladder is grossly unremarkable.  Moderate stool burden throughout the colon.  Liver, spleen, pancreas, adrenals have an  unremarkable unenhanced appearance. Small bowel is decompressed.  No free fluid, free air or adenopathy.  Prior hysterectomy.  No adnexal masses. no evidence of abdominal or pelvic wall hematoma. No acute bony abnormality.  IMPRESSION: No acute findings in the abdomen or pelvis.   Original Report Authenticated By: Charlett Nose, M.D.    Ct Cervical Spine Wo Contrast  09/20/2012  *RADIOLOGY REPORT*  Clinical Data:  Fall.  On Coumadin.  Right facial swelling.  CT HEAD WITHOUT CONTRAST CT MAXILLOFACIAL WITHOUT CONTRAST CT CERVICAL SPINE WITHOUT CONTRAST  Technique:  Multidetector CT imaging of the head, cervical spine, and maxillofacial structures were performed using the standard protocol without intravenous contrast. Multiplanar CT image reconstructions of the cervical spine and maxillofacial structures were also generated.  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  Old left cerebellar infarct.  No acute infarction. No hemorrhage or hydrocephalus.  No acute calvarial abnormality.  IMPRESSION: No acute intracranial abnormality.  CT MAXILLOFACIAL  Findings:  Fractures noted through the anterior and lateral walls of the right maxillary sinus.  Blood within the maxillary sinus. The fracture extends through the inferior orbital rim.  Two fractures are noted within the mid and posterior right zygomatic arch.  Fracture or diastasis in the lateral right orbital wall.  Mandible is intact.  Blood noted within the right paranasal sinuses.  Soft tissue swelling within the left face.  Orbital soft tissues grossly unremarkable.  IMPRESSION: Fractures through the anterior and lateral walls of the right maxillary sinus, extending to involve the right inferior orbital rim.  Fractures within the right zygomatic arch and right lateral orbital wall.  Blood throughout the right paranasal sinuses.  CT CERVICAL  SPINE  Findings:   Normal alignment.  Prevertebral soft tissues are normal.  Disc spaces are maintained.  No fracture.  No epidural or paraspinal hematoma.  Densely calcified vessels throughout the neck.  IMPRESSION: No acute bony abnormality.   Original Report Authenticated By: Charlett Nose, M.D.    Dg Pelvis Portable  09/20/2012  *RADIOLOGY REPORT*  Clinical Data: Generalized pain and right hip pain status post fall  PORTABLE PELVIS  Comparison: Prior abdominal x-ray 01/27/2012, prior CT abdomen/pelvis 01/27/2012  Findings: The bones diffusely osteopenic.  No displaced fracture or definite lucency identified.  Extensive atherosclerotic calcifications throughout nearly all of the visualized arterial beds.  Numerous surgical clips project over the left abdomen and anatomic pelvis.  Moderately large volume of formed stool throughout the colon.  Bilateral hip joint osteoarthritis.  Remote healed fracture of the left inferior pubic ramus.  IMPRESSION:  1.  No acute fracture or malalignment identified.  The bones are diffusely osteopenic which limits evaluation for nondisplaced fracture.  If clinical concern persists, nuclear medicine bone scan or MRI could further evaluate. 2.  Remote healed fracture of the left inferior pubic ramus. 3.  Moderate to high volume of formed stool throughout the colon and rectum suggesting underlying constipation. 4.  Diffuse atherosclerotic vascular calcifications.   Original Report Authenticated By: Malachy Moan, M.D.    Dg Chest Port 1 View  09/20/2012  *RADIOLOGY REPORT*  Clinical Data: Fall, generalized pain  PORTABLE CHEST - 1 VIEW  Comparison: Prior chest x-ray 12/14/2010  Findings: No pneumothorax or pleural effusion.  Unchanged borderline cardiomegaly.  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  Dense calcification of the mitral valve annulus.  Atherosclerotic calcifications of the transverse aorta.  No pulmonary edema,  focal airspace consolidation or suspicious pulmonary nodule.  No acute osseous abnormality.  IMPRESSION: No acute cardiopulmonary disease.   Original Report Authenticated By: Malachy Moan, M.D.    Ct Maxillofacial Wo Cm  09/20/2012  *RADIOLOGY REPORT*  Clinical Data:  Fall.  On Coumadin.  Right facial swelling.  CT HEAD WITHOUT CONTRAST CT MAXILLOFACIAL WITHOUT CONTRAST CT CERVICAL SPINE WITHOUT CONTRAST  Technique:  Multidetector CT imaging of the head, cervical spine, and maxillofacial structures were performed using the standard protocol without intravenous contrast. Multiplanar CT image reconstructions of the cervical spine and maxillofacial structures were also generated.  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  Old left cerebellar infarct.  No acute infarction. No hemorrhage or hydrocephalus.  No acute calvarial abnormality.  IMPRESSION: No acute intracranial abnormality.  CT MAXILLOFACIAL  Findings:  Fractures noted through the anterior and lateral walls of the right maxillary sinus.  Blood within the maxillary sinus. The fracture extends through the inferior orbital rim.  Two fractures are noted within the mid and posterior right zygomatic arch.  Fracture or diastasis in the lateral right orbital wall.  Mandible is intact.  Blood noted within the right paranasal sinuses.  Soft tissue swelling within the left face.  Orbital soft tissues grossly unremarkable.  IMPRESSION: Fractures through the anterior and lateral walls of the right maxillary sinus, extending to involve the right inferior orbital rim.  Fractures within the right zygomatic arch and right lateral orbital wall.  Blood throughout the right paranasal sinuses.  CT CERVICAL SPINE  Findings:   Normal alignment.  Prevertebral soft tissues are normal.  Disc spaces are maintained.  No  fracture.  No epidural or  paraspinal hematoma.  Densely calcified vessels throughout the neck.  IMPRESSION: No acute bony abnormality.   Original Report Authenticated By: Charlett Nose, M.D.      1. Fall   2. Concussion   3. Confusion   4. Abdominal pain      Date: 09/20/2012  Rate: 76  Rhythm: normal sinus rhythm  QRS Axis: normal  Intervals: normal  ST/T Wave abnormalities: normal  Conduction Disutrbances:none  Narrative Interpretation:   Old EKG Reviewed:     MDM    72 y/o F presents s/p fall. Amnesia to event. Confused. Some baseline confusion. On coumadin. unwitnessed fall. Found down by walker. Due to patient's head injury, on coumadin, confusion, irregular pupils. Upgraded to level 2 trauma. Labs and imaging as above. Labs without contrast as patient with h/o renal transplant. Facial fractures discussed with ENT. Patient admitted to trauma surgery.       I personally performed the services described in this documentation, which was scribed in my presence. The recorded information has been reviewed and is accurate.     Stevie Kern, MD 09/21/12 [phone]

## 2012-09-20 NOTE — ED Notes (Addendum)
Pt returned from CT scanner; husband at bedside; reports in last 2 weeks, pt has been increasingly demented; hallucinating people in their home. Reports she is "reasonably aware at home". Husband reports pt walks at home with walker and he is able to leave her alone for an hour or so everyday.

## 2012-09-20 NOTE — ED Notes (Signed)
2.5 haldol given per MD verbal

## 2012-09-20 NOTE — H&P (Signed)
Elizabeth [name] is an 72 y.o. female.   Chief Complaint: Fall HPI: This is a 72 year old female who ambulates with a walker who presents after a fall. This was not witnessed. I obtained most of the history from the chart and her husband that she is sedated. It is uncertain malignant time she was found down. It is presumed that she hit her walker on her abdomen when she fell. She was complaining of headaches and some neck pain and right-sided abdominal wall pain. She has been hemodynamically stable throughout her presentation. Her husband reports that she was following  His commands prior to administration of some nausea medication which is now sedated her.  Past Medical History  Diagnosis Date  . Coronary artery disease   . Chronic kidney disease     s/p cadaveric renal transplant in 2001  . Diabetes mellitus   . PVD (peripheral vascular disease)   . History of orthostatic hypotension   . Hyperlipidemia   . Hypothyroidism   . Anemia   . Pancreatic cyst   . Osteoporosis     recurrent fractures  . Melanoma   . Carotid bruit     left  . Adenomatous polyp   . GERD (gastroesophageal reflux disease)   . Gastritis   . CVA (cerebral infarction)   . Bradycardia   . Renal artery stenosis   . Diabetic retinopathy(362.0)   . Renal osteodystrophy   . Hyperparathyroidism   . Leg ulcer     from poor fitting prosthetic  . H/O immunosuppressive therapy   . 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 Fx wrist   . Fx ankle   . Neuropathy   . Nephropathy   . Carotid artery stenosis     mild  . Stroke   . Tubular adenoma   . Colitis   . IBS (irritable bowel syndrome)   . Ischemic colitis   . 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 Esophageal candidiasis   . Neurogenic pain-esophagus 09/04/2012    Past Surgical History  Procedure Date  . Kidney transplant 08/15/2000    [facility] in [place]  . 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 Appendectomy   . Cataract extraction   . Cholecystectomy   . Abdominal hysterectomy   . Thyroid surgery   . Tonsillectomy   . Cesarean section     x 2  . Vitrectomy     right eye  . Coronary artery bypass graft   . Colonoscopy w/ biopsies and polypectomy 02/18/2009    adenomatous polyps, diverticulosis, internal hemorrhoids  . Eus 02/04/2010    w/FNA, pancreatic cyst  . Upper gastrointestinal endoscopy 12/01/2009  . Colonoscopy 01/30/2012    Procedure: COLONOSCOPY;  Surgeon: Rachael Fee, MD;  Location: [facility] ENDOSCOPY;  Service: Endoscopy;  Laterality: N/A;  . Esophagogastroduodenoscopy 05/02/2012    Procedure: ESOPHAGOGASTRODUODENOSCOPY (EGD);  Surgeon: Iva Boop, MD;  Location: [facility] ENDOSCOPY;  Service: Endoscopy;  Laterality: N/A;    Family History  Problem Relation Age of Onset  . Stroke Mother 76  . Heart attack Father 55  . Colon cancer Neg Hx   . Diabetes Neg Hx    Social History:  reports that she has never smoked. She has never used smokeless tobacco. She reports that she does not drink alcohol or  use illicit drugs.  Allergies:  Allergies  Allergen Reactions  . Percocet (Oxycodone-Acetaminophen) Nausea And Vomiting     (Not in a hospital admission)  Results for orders placed during the hospital encounter of 09/20/12 (from the past 48 hour(s))  TROPONIN I     Status: Normal   Collection Time   09/20/12  5:23 PM      Component Value Range Comment   Troponin I <0.30  <0.30 ng/mL   CK TOTAL AND CKMB     Status: Abnormal   Collection Time   09/20/12  5:23 PM      Component Value Range Comment   Total CK 114  7 - 177 U/L    CK, MB 3.2  0.3 - 4.0 ng/mL    Relative Index 2.8 (*) 0.0 - 2.5   TYPE AND SCREEN     Status: Normal   Collection Time   09/20/12  5:29 PM      Component Value Range Comment   ABO/RH(D) O POS      Antibody Screen NEG      Sample Expiration 09/23/2012      CDS SEROLOGY     Status: Normal   Collection Time   09/20/12  5:36 PM      Component Value Range Comment   CDS serology specimen        Value: SPECIMEN WILL BE HELD FOR 14 DAYS IF TESTING IS REQUIRED  COMPREHENSIVE METABOLIC PANEL     Status: Abnormal   Collection Time   09/20/12  5:36 PM      Component Value Range Comment   Sodium 137  135 - 145 mEq/L    Potassium 4.1  3.5 - 5.1 mEq/L    Chloride 100  96 - 112 mEq/L    CO2 25  19 - 32 mEq/L    Glucose, Bld 220 (*) 70 - 99 mg/dL    BUN 21  6 - 23 mg/dL    Creatinine, Ser 7.82  0.50 - 1.10 mg/dL    Calcium 9.9  8.4 - 95.6 mg/dL    Total Protein 6.3  6.0 - 8.3 g/dL    Albumin 3.6  3.5 - 5.2 g/dL    AST 27  0 - 37 U/L    ALT 16  0 - 35 U/L    Alkaline Phosphatase 63  39 - 117 U/L    Total Bilirubin 0.5  0.3 - 1.2 mg/dL    GFR calc non Af Amer 69 (*) >90 mL/min    GFR calc Af Amer 80 (*) >90 mL/min   CBC     Status: Normal   Collection Time   09/20/12  5:36 PM      Component Value Range Comment   WBC 10.0  4.0 - 10.5 K/uL    RBC 4.35  3.87 - 5.11 MIL/uL    Hemoglobin 12.9  12.0 - 15.0 g/dL    HCT 21.3  08.6 - 57.8 %    MCV 90.1  78.0 - 100.0 fL    MCH 29.7  26.0 - 34.0 pg    MCHC 32.9  30.0 - 36.0 g/dL    RDW 46.9  62.9 - 52.8 %    Platelets 167  150 - 400 K/uL   PROTIME-INR     Status: Abnormal   Collection Time   09/20/12  5:36 PM      Component Value Range Comment   Prothrombin Time 22.2 (*) 11.6 - 15.2 seconds  INR 2.04 (*) 0.00 - 1.49   APTT     Status: Normal   Collection Time   09/20/12  5:36 PM      Component Value Range Comment   aPTT 37  24 - 37 seconds   ETHANOL     Status: Normal   Collection Time   09/20/12  5:36 PM      Component Value Range Comment   Alcohol, Ethyl (B) <11  0 - 11 mg/dL   POCT I-STAT, CHEM 8     Status: Abnormal   Collection Time   09/20/12  5:46 PM      Component Value Range Comment   Sodium 137  135 - 145 mEq/L    Potassium 4.0  3.5 - 5.1 mEq/L    Chloride 101  96 - 112 mEq/L     BUN 23  6 - 23 mg/dL    Creatinine, Ser 1.61  0.50 - 1.10 mg/dL    Glucose, Bld 096 (*) 70 - 99 mg/dL    Calcium, Ion 0.45  4.09 - 1.30 mmol/L    TCO2 29  0 - 100 mmol/L    Hemoglobin 13.3  12.0 - 15.0 g/dL    HCT 81.1  91.4 - 78.2 %   CG4 I-STAT (LACTIC ACID)     Status: Abnormal   Collection Time   09/20/12  5:48 PM      Component Value Range Comment   Lactic Acid, Venous 2.27 (*) 0.5 - 2.2 mmol/L   GLUCOSE, CAPILLARY     Status: Abnormal   Collection Time   09/20/12  7:51 PM      Component Value Range Comment   Glucose-Capillary 162 (*) 70 - 99 mg/dL    Ct Abdomen Pelvis Wo Contrast  09/20/2012  *RADIOLOGY REPORT*  Clinical Data:  Fall, on Coumadin.  Right lower quadrant pain.  CT CHEST, ABDOMEN AND PELVIS WITHOUT CONTRAST  Technique:  Multidetector CT imaging of the chest, abdomen and pelvis was performed following the standard protocol without IV contrast.  Comparison:  01/27/2012.  CT CHEST  Findings:  Prior CABG.  Densely calcified mitral valve.  Heart is borderline in size.  Aorta is heavily calcified, non-aneurysmal.  Esophagus is dilated with air-fluid level in the upper to mid thoracic esophagus.  This may be related to dysmotility or reflux.  No mediastinal, hilar, or axillary adenopathy.  Visualized thyroid and chest wall soft tissues unremarkable.  Linear scarring or atelectasis in the left base.  Lungs otherwise clear.  No effusions.  No acute bony abnormality.  IMPRESSION: No acute findings in the chest.  CT ABDOMEN AND PELVIS  Findings:   Densely calcified aorta, iliac vessels and branch vessels.  Kidneys are severely atrophic.  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  Mild fullness of the renal collecting system.  Urinary bladder is grossly unremarkable.  Moderate stool burden throughout the colon.  Liver, spleen, pancreas, adrenals have an unremarkable unenhanced appearance. Small bowel is decompressed.  No free fluid, free air or adenopathy.  Prior  hysterectomy.  No adnexal masses. no evidence of abdominal or pelvic wall hematoma. No acute bony abnormality.  IMPRESSION: No acute findings in the abdomen or pelvis.   Original Report Authenticated By: Charlett Nose, M.D.    Ct Head Wo Contrast  09/20/2012  *RADIOLOGY REPORT*  Clinical Data:  Fall.  On Coumadin.  Right facial swelling.  CT HEAD WITHOUT CONTRAST CT MAXILLOFACIAL WITHOUT CONTRAST CT CERVICAL SPINE WITHOUT CONTRAST  Technique:  Multidetector CT imaging of the head, cervical spine, and maxillofacial structures were performed using the standard protocol without intravenous contrast. Multiplanar CT image reconstructions of the cervical spine and maxillofacial structures were also generated.  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  Old left cerebellar infarct.  No acute infarction. No hemorrhage or hydrocephalus.  No acute calvarial abnormality.  IMPRESSION: No acute intracranial abnormality.  CT MAXILLOFACIAL  Findings:  Fractures noted through the anterior and lateral walls of the right maxillary sinus.  Blood within the maxillary sinus. The fracture extends through the inferior orbital rim.  Two fractures are noted within the mid and posterior right zygomatic arch.  Fracture or diastasis in the lateral right orbital wall.  Mandible is intact.  Blood noted within the right paranasal sinuses.  Soft tissue swelling within the left face.  Orbital soft tissues grossly unremarkable.  IMPRESSION: Fractures through the anterior and lateral walls of the right maxillary sinus, extending to involve the right inferior orbital rim.  Fractures within the right zygomatic arch and right lateral orbital wall.  Blood throughout the right paranasal sinuses.  CT CERVICAL SPINE  Findings:   Normal alignment.  Prevertebral soft tissues are normal.  Disc spaces are maintained.  No fracture.   No epidural or paraspinal hematoma.  Densely calcified vessels throughout the neck.  IMPRESSION: No acute bony abnormality.   Original Report Authenticated By: Charlett Nose, M.D.    Ct Chest Wo Contrast  09/20/2012  *RADIOLOGY REPORT*  Clinical Data:  Fall, on Coumadin.  Right lower quadrant pain.  CT CHEST, ABDOMEN AND PELVIS WITHOUT CONTRAST  Technique:  Multidetector CT imaging of the chest, abdomen and pelvis was performed following the standard protocol without IV contrast.  Comparison:  01/27/2012.  CT CHEST  Findings:  Prior CABG.  Densely calcified mitral valve.  Heart is borderline in size.  Aorta is heavily calcified, non-aneurysmal.  Esophagus is dilated with air-fluid level in the upper to mid thoracic esophagus.  This may be related to dysmotility or reflux.  No mediastinal, hilar, or axillary adenopathy.  Visualized thyroid and chest wall soft tissues unremarkable.  Linear scarring or atelectasis in the left base.  Lungs otherwise clear.  No effusions.  No acute bony abnormality.  IMPRESSION: No acute findings in the chest.  CT ABDOMEN AND PELVIS  Findings:   Densely calcified aorta, iliac vessels and branch vessels.  Kidneys are severely atrophic.  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  Mild fullness of the renal collecting system.  Urinary bladder is grossly unremarkable.  Moderate stool burden throughout the colon.  Liver, spleen, pancreas, adrenals have an unremarkable unenhanced appearance. Small bowel is decompressed.  No free fluid, free air or adenopathy.  Prior hysterectomy.  No adnexal masses. no evidence of abdominal or pelvic wall hematoma. No acute bony abnormality.  IMPRESSION: No acute findings in the abdomen or pelvis.   Original Report Authenticated By: Charlett Nose, M.D.    Ct Cervical Spine Wo Contrast  09/20/2012  *RADIOLOGY REPORT*  Clinical Data:  Fall.  On Coumadin.  Right facial swelling.  CT HEAD WITHOUT CONTRAST CT MAXILLOFACIAL WITHOUT CONTRAST CT  CERVICAL SPINE WITHOUT CONTRAST  Technique:  Multidetector CT imaging of the head, cervical spine, and maxillofacial structures were performed using the standard protocol without intravenous contrast. Multiplanar CT image reconstructions of the cervical spine and maxillofacial structures were  also generated.  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  Old left cerebellar infarct.  No acute infarction. No hemorrhage or hydrocephalus.  No acute calvarial abnormality.  IMPRESSION: No acute intracranial abnormality.  CT MAXILLOFACIAL  Findings:  Fractures noted through the anterior and lateral walls of the right maxillary sinus.  Blood within the maxillary sinus. The fracture extends through the inferior orbital rim.  Two fractures are noted within the mid and posterior right zygomatic arch.  Fracture or diastasis in the lateral right orbital wall.  Mandible is intact.  Blood noted within the right paranasal sinuses.  Soft tissue swelling within the left face.  Orbital soft tissues grossly unremarkable.  IMPRESSION: Fractures through the anterior and lateral walls of the right maxillary sinus, extending to involve the right inferior orbital rim.  Fractures within the right zygomatic arch and right lateral orbital wall.  Blood throughout the right paranasal sinuses.  CT CERVICAL SPINE  Findings:   Normal alignment.  Prevertebral soft tissues are normal.  Disc spaces are maintained.  No fracture.  No epidural or paraspinal hematoma.  Densely calcified vessels throughout the neck.  IMPRESSION: No acute bony abnormality.   Original Report Authenticated By: Charlett Nose, M.D.    Dg Pelvis Portable  09/20/2012  *RADIOLOGY REPORT*  Clinical Data: Generalized pain and right hip pain status post fall  PORTABLE PELVIS  Comparison: Prior abdominal x-ray 01/27/2012, prior CT abdomen/pelvis 01/27/2012   Findings: The bones diffusely osteopenic.  No displaced fracture or definite lucency identified.  Extensive atherosclerotic calcifications throughout nearly all of the visualized arterial beds.  Numerous surgical clips project over the left abdomen and anatomic pelvis.  Moderately large volume of formed stool throughout the colon.  Bilateral hip joint osteoarthritis.  Remote healed fracture of the left inferior pubic ramus.  IMPRESSION:  1.  No acute fracture or malalignment identified.  The bones are diffusely osteopenic which limits evaluation for nondisplaced fracture.  If clinical concern persists, nuclear medicine bone scan or MRI could further evaluate. 2.  Remote healed fracture of the left inferior pubic ramus. 3.  Moderate to high volume of formed stool throughout the colon and rectum suggesting underlying constipation. 4.  Diffuse atherosclerotic vascular calcifications.   Original Report Authenticated By: Malachy Moan, M.D.    Dg Chest Port 1 View  09/20/2012  *RADIOLOGY REPORT*  Clinical Data: Fall, generalized pain  PORTABLE CHEST - 1 VIEW  Comparison: Prior chest x-ray 12/14/2010  Findings: No pneumothorax or pleural effusion.  Unchanged borderline cardiomegaly.  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  Dense calcification of the mitral valve annulus.  Atherosclerotic calcifications of the transverse aorta.  No pulmonary edema, focal airspace consolidation or suspicious pulmonary nodule.  No acute osseous abnormality.  IMPRESSION: No acute cardiopulmonary disease.   Original Report Authenticated By: Malachy Moan, M.D.    Ct Maxillofacial Wo Cm  09/20/2012  *RADIOLOGY REPORT*  Clinical Data:  Fall.  On Coumadin.  Right facial swelling.  CT HEAD WITHOUT CONTRAST CT MAXILLOFACIAL WITHOUT CONTRAST CT CERVICAL SPINE WITHOUT CONTRAST  Technique:  Multidetector CT imaging of the head, cervical spine, and maxillofacial structures were performed  using the standard protocol without intravenous contrast. Multiplanar CT image reconstructions of the cervical spine and maxillofacial structures were also generated.  Comparison:  11/10/2010  CT HEAD  Findings: Large old right posterior circulation infarct  with encephalomalacia in the right occipital and medial posterior temporal lobe.  Old left cerebellar infarct.  No acute infarction. No hemorrhage or hydrocephalus.  No acute calvarial abnormality.  IMPRESSION: No acute intracranial abnormality.  CT MAXILLOFACIAL  Findings:  Fractures noted through the anterior and lateral walls of the right maxillary sinus.  Blood within the maxillary sinus. The fracture extends through the inferior orbital rim.  Two fractures are noted within the mid and posterior right zygomatic arch.  Fracture or diastasis in the lateral right orbital wall.  Mandible is intact.  Blood noted within the right paranasal sinuses.  Soft tissue swelling within the left face.  Orbital soft tissues grossly unremarkable.  IMPRESSION: Fractures through the anterior and lateral walls of the right maxillary sinus, extending to involve the right inferior orbital rim.  Fractures within the right zygomatic arch and right lateral orbital wall.  Blood throughout the right paranasal sinuses.  CT CERVICAL SPINE  Findings:   Normal alignment.  Prevertebral soft tissues are normal.  Disc spaces are maintained.  No fracture.  No epidural or paraspinal hematoma.  Densely calcified vessels throughout the neck.  IMPRESSION: No acute bony abnormality.   Original Report Authenticated By: Charlett Nose, M.D.     Review of Systems  Unable to perform ROS: mental acuity    Blood pressure 184/87, pulse 77, resp. rate 16, SpO2 98.00%. Physical Exam  Constitutional: She appears well-developed and well-nourished. No distress.       Elderly-appearing female, sedated, c-collar in place  HENT:  Right Ear: External ear normal.  Left Ear: External ear normal.  Nose:  Nose normal.  Mouth/Throat: Oropharynx is clear and moist.       There is ecchymosis around her right eye with swelling of the eyelids. The right pupil is reactive. There is lateral scleral irritation  Eyes: Conjunctivae normal are normal. Pupils are equal, round, and reactive to light. No scleral icterus.  Neck: Neck supple. No tracheal deviation present.       c-collar in place. Does not seem tender to palpation  Cardiovascular: Normal rate, normal heart sounds and intact distal pulses.   No murmur heard.      Irregular rhythm  Respiratory: Effort normal and breath sounds normal. No respiratory distress. She has no wheezes. She exhibits tenderness.       There is tenderness along her right costal margin/abdomen  GI: Soft. There is tenderness. There is guarding.       Tenderness with guarding in the right upper quadrant. The rest of the abdomen is soft and nontender. She has a well-healed midline incision  Musculoskeletal: Normal range of motion. She exhibits edema and tenderness.       There is slight tenderness along the right hip as well as swelling and ecchymosis along the left hand and wrist with a small laceration on the hand and a tear in the skin at the left elbow There are no long bone abnormalities  Neurological:       Sleeping but arousable. Moves all 4 extremities  Skin: Skin is warm and dry. She is not diaphoretic. No erythema.   Pelvis: Stable to rock  Assessment/Plan 72 year old female status post fall with the following injuries:  Concussion with no acute intracranial hemorrhage   Right-sided facial fractures  Multiple abrasions  I will admit her to the intensive care unit for close monitoring. A CT of the head will be repeated should her mental status change. The surgeon on call for facial injuries  was consult by the emergency room physician. Currently, her hemoglobin is stable. I will not acutely reverse her anticoagulation. I will repeat her PT and INR the morning. If  her abdominal tenderness worsens, I will get a CAT scan of the abdomen and pelvis with contrast. I will also leave her c-collar in place until her mental status improves and a more adequate examination can be performed.  Jeanice Dempsey A 09/20/2012, 8:00 PM

## 2012-09-20 NOTE — ED Notes (Signed)
Pt resting quietly; tech cleaning up patient.

## 2012-09-20 NOTE — ED Notes (Signed)
Pt continues to scream repeatedly

## 2012-09-20 NOTE — ED Notes (Addendum)
Non witnessed fall at home; pt on coumadin. A&O to person only. Vomtis of blood at scene. 4 zofran given. Reports she hurts all over. Right eye bruised; pupils unequal. CBG 47 on scene; recheck 158 after D10 from EMS. PT on backboard and c collar. Back board removed by ER MD's and C collar remains in place.

## 2012-09-20 NOTE — ED Notes (Signed)
100 mcg fent given IV per MD, Rancour, verbal order

## 2012-09-20 NOTE — ED Notes (Signed)
Dr Rayburn Ma to bedside

## 2012-09-21 ENCOUNTER — Inpatient Hospital Stay (HOSPITAL_COMMUNITY): Payer: Medicare Other

## 2012-09-21 ENCOUNTER — Encounter: Payer: Self-pay | Admitting: Nurse Practitioner

## 2012-09-21 ENCOUNTER — Encounter: Payer: Self-pay | Admitting: Cardiothoracic Surgery

## 2012-09-21 DIAGNOSIS — S02402A Zygomatic fracture, unspecified, initial encounter for closed fracture: Secondary | ICD-10-CM

## 2012-09-21 DIAGNOSIS — S0285XA Fracture of orbit, unspecified, initial encounter for closed fracture: Secondary | ICD-10-CM

## 2012-09-21 DIAGNOSIS — I951 Orthostatic hypotension: Secondary | ICD-10-CM

## 2012-09-21 DIAGNOSIS — D136 Benign neoplasm of pancreas: Secondary | ICD-10-CM

## 2012-09-21 DIAGNOSIS — S060XAA Concussion with loss of consciousness status unknown, initial encounter: Secondary | ICD-10-CM | POA: Diagnosis present

## 2012-09-21 DIAGNOSIS — R42 Dizziness and giddiness: Secondary | ICD-10-CM

## 2012-09-21 DIAGNOSIS — W19XXXA Unspecified fall, initial encounter: Secondary | ICD-10-CM

## 2012-09-21 DIAGNOSIS — I4891 Unspecified atrial fibrillation: Secondary | ICD-10-CM

## 2012-09-21 DIAGNOSIS — S0240CA Maxillary fracture, right side, initial encounter for closed fracture: Secondary | ICD-10-CM

## 2012-09-21 DIAGNOSIS — S060X9A Concussion with loss of consciousness of unspecified duration, initial encounter: Secondary | ICD-10-CM | POA: Diagnosis present

## 2012-09-21 HISTORY — DX: Maxillary fracture, right side, initial encounter for closed fracture: S02.40CA

## 2012-09-21 HISTORY — DX: Fracture of orbit, unspecified, initial encounter for closed fracture: S02.85XA

## 2012-09-21 HISTORY — DX: Zygomatic fracture, unspecified side, initial encounter for closed fracture: S02.402A

## 2012-09-21 LAB — BASIC METABOLIC PANEL
BUN: 24 mg/dL — ABNORMAL HIGH (ref 6–23)
Chloride: 99 mEq/L (ref 96–112)
Creatinine, Ser: 0.76 mg/dL (ref 0.50–1.10)
GFR calc Af Amer: >90 mL/min (ref >90)
GFR calc non Af Amer: 82 mL/min — ABNORMAL LOW (ref >90)

## 2012-09-21 LAB — URINALYSIS, ROUTINE W REFLEX MICROSCOPIC
Glucose, UA: NEGATIVE mg/dL
Leukocytes, UA: NEGATIVE
Nitrite: NEGATIVE
Protein, ur: NEGATIVE mg/dL

## 2012-09-21 LAB — CBC
HCT: 34.3 % — ABNORMAL LOW (ref 36.0–46.0)
MCH: 29.5 pg (ref 26.0–34.0)
MCHC: 32.7 g/dL (ref 30.0–36.0)
MCV: 90.3 fL (ref 78.0–100.0)
RDW: 13.6 % (ref 11.5–15.5)

## 2012-09-21 LAB — GLUCOSE, CAPILLARY
Glucose-Capillary: 200 mg/dL — ABNORMAL HIGH (ref 70–99)
Glucose-Capillary: 17 mg/dL — CL (ref 70–99)
Glucose-Capillary: 101 mg/dL — ABNORMAL HIGH (ref 70–99)
Glucose-Capillary: 149 mg/dL — ABNORMAL HIGH (ref 70–99)
Glucose-Capillary: 250 mg/dL — ABNORMAL HIGH (ref 70–99)

## 2012-09-21 MED ORDER — INSULIN ASPART 100 UNIT/ML ~~LOC~~ SOLN
1.0000 [IU] | Freq: Three times a day (TID) | SUBCUTANEOUS | Status: DC
  Administered 2012-09-21: 1 [IU] via SUBCUTANEOUS
  Administered 2012-09-22: 3 [IU] via SUBCUTANEOUS
  Administered 2012-09-22: 2 [IU] via SUBCUTANEOUS
  Administered 2012-09-22: 1 [IU] via SUBCUTANEOUS
  Administered 2012-09-23: 2 [IU] via SUBCUTANEOUS
  Administered 2012-09-24 (×2): 1 [IU] via SUBCUTANEOUS
  Administered 2012-09-25: 2 [IU] via SUBCUTANEOUS

## 2012-09-21 MED ORDER — INSULIN ASPART 100 UNIT/ML ~~LOC~~ SOLN: 0.0000 [IU] | Freq: Three times a day (TID) | SUBCUTANEOUS | Status: DC

## 2012-09-21 MED ORDER — DEXTROSE 50 % IV SOLN
INTRAVENOUS | Status: AC
  Administered 2012-09-21: 50 mL
  Filled 2012-09-21: qty 50

## 2012-09-21 MED ORDER — KCL IN DEXTROSE-NACL 20-5-0.9 MEQ/L-%-% IV SOLN
INTRAVENOUS | Status: DC
  Administered 2012-09-21 – 2012-09-22 (×2): 1000 mL via INTRAVENOUS
  Filled 2012-09-21 (×4): qty 1000

## 2012-09-21 MED ORDER — DEXTROSE 50 % IV SOLN
1.0000 | Freq: Once | INTRAVENOUS | Status: AC
  Administered 2012-09-21: 50 mL via INTRAVENOUS

## 2012-09-21 MED ORDER — DEXTROSE 50 % IV SOLN
INTRAVENOUS | Status: AC
  Filled 2012-09-21: qty 50

## 2012-09-21 MED ORDER — INSULIN GLARGINE 100 UNIT/ML ~~LOC~~ SOLN
10.0000 [IU] | Freq: Every day | SUBCUTANEOUS | Status: DC
  Administered 2012-09-21 – 2012-09-23 (×3): 10 [IU] via SUBCUTANEOUS

## 2012-09-21 NOTE — Clinical Social Work Psychosocial (Signed)
     Clinical Social Work Department BRIEF PSYCHOSOCIAL ASSESSMENT 09/21/2012  Patient:  Elizabeth [name], Elizabeth [name]     Account Number:  [id number]     Admit date:  09/20/2012  Clinical Social Worker:  Verl Blalock  Date/Time:  09/21/2012 03:02 PM  Referred by:  Physician  Date Referred:  09/21/2012 Referred for  Psychosocial assessment  SNF Placement   Other Referral:   Interview type:  Family Other interview type:   Patient husband at bedside    PSYCHOSOCIAL DATA Living Status:  HUSBAND Admitted from facility:   Level of care:   Primary support name:  Bristal Steffy [phone] Primary support relationship to patient:  SPOUSE Degree of support available:   Strong    CURRENT CONCERNS Current Concerns  Post-Acute Placement   Other Concerns:    SOCIAL WORK ASSESSMENT / PLAN Clinical Social Worker met with patient husband at bedside to offer support and discuss patient needs at discharge. Patient husband states that they moved from [place] to the [place] area several years ago to be close to their daughter and grandchildren.  Since moving here, patient has experienced several strokes and spent two and half months in [facility].  Patient was discharged home with husband and [facility] was arranged.  Patient husband states that they have all the needed equipment at home and the home is appropriate for patient return if physically able.    Patient husband feels guilty about patient fall since he had gone to the Chiropractor and was gone for an hour which is when patient fell.  Patient husband is usually home 24 hours a day to assist patient as needed and is prepared to continue to do so at discharge.  Patient husband is agreeable to patient returning to [facility] if absolutely necessary but prefers to take patient home with home health following if possible.  CSW will submit appropriate paperwork to [facility] and await PT/OT evaluations to determine  recommendations.    No SBIRT completed at this time, due to patient current mental status.  Patient husband states that patient mental status is not always appropriate, therefore CSW will not complete SBIRT with patient during this admission.  Per patient husband report, no current alcohol use on patient behalf at this time.    CSW remains available for support and to facilitate patient discharge needs once medically stable.   Assessment/plan status:  Psychosocial Support/Ongoing Assessment of Needs Other assessment/ plan:   Information/referral to community resources:   Visual merchandiser provided patient husband with contact information and access to trauma MD for questions.    PATIENTS/FAMILYS RESPONSE TO PLAN OF CARE: Patient oriented to self only and sleeping during CSW assessment with patient husband.  Patient husband is very agreeable with all discharge options but is hopeful that patient will be able to return home once medically stable. CSW notified CM of patient husband wishes for home health needs if decided she is appropriate.  Patient husband verbalized his appreciation for CSW support and concern.

## 2012-09-21 NOTE — Progress Notes (Signed)
Inpatient Diabetes Program Recommendations  AACE/ADA: New Consensus Statement on Inpatient Glycemic Control (2013)  Target Ranges:  Prepandial:   less than 140 mg/dL      Peak postprandial:   less than 180 mg/dL (1-2 hours)      Critically ill patients:  140 - 180 mg/dL   Reason for Visit: Spoke to patient's husband regarding home diabetes management.  Patient see's Dr. Balan-endocrinologist and has had diabetes for over 40 years.  Her A1C is usually in around 6.7%.  He administers her insulin due to patient's inability to see. Her home diabetes regimen is Lantus 10 units daily and Novolog with meals.  He states that she has been snacking more recently which has increased CBG's in the PM.  He gave her 4 units of Novolog with lunch yesterday and went to the Dr.   Marletta Lor was unwitnessed and patient's husband states that he thinks a low blood sugar likely caused her to fall.  CBG dropped to 17 mg/dL this morning after patient received 5 units of Novolog at 4 am.  Patient appears sensitive to insulin and according to her husband 1 unit drops her CBG approximately 75 mg/dL.   Called and discussed with Dr. Sharl Ma.  MD ordered custom Novolog scale and patients home dose of Lantus.  A1C pending.  Patient will need follow-up with endocrinologist after hospitalization.

## 2012-09-21 NOTE — Clinical Documentation Improvement (Signed)
CHANGE MENTAL STATUS DOCUMENTATION CLARIFICATION   THIS DOCUMENT IS NOT A PERMANENT PART OF THE MEDICAL RECORD          09/21/12  Dr. Lindie Spruce and[facility]or Associates,  In an effort to better capture your patient's severity of illness, reflect appropriate length of stay and utilization of resources, a review of the patient medical record has revealed the following indicators:  "Patient is confused and intermittently agitated. Not following commands. Pupils have changed and are currently non-reactive. She has not received any sedation since 0300. CBG 17?" "NEURO  Altered Mental Status: obtundation, dementia and not responding at all.  Plan: get stat CT head.  ENDO  Hypoglycemia unknown cause  Plan: Start IVF with D5W, bolus D50W"  Deven Audi O  09/21/2012 Progress Note  CT OF HEAD WITHOUT CONTRAST  09/21/2012 IMPRESSION:  No acute intracranial abnormality. Multiple facial fractures as previously noted.  Original Report Authenticated By: Francene Boyers, M.D.  Blood Sugar 09/21/12 at 0814 - 17 Blood Sugar 09/21/12 at 1144 - 101     Based on your clinical judgment, please document in the progress notes and discharge summary if a condition below provides greater specificity regarding the patient's altered mental status:   - Acute Encephalopathy, including Type and/or Cause, if known   - Other Condition   - Unable to Clinically Determine   In responding to this query please exercise your independent judgment.    The fact that a query is asked, does not imply that any particular answer is desired or expected.   Reviewed: additional documentation in the medical record  Thank You,  Jerral Ralph  RN BSN CCDS Certified Clinical Documentation Specialist: Cell   [phone]  Health Information Management [place]  TO RESPOND TO THE THIS QUERY, FOLLOW THE INSTRUCTIONS BELOW:  1. If needed, update documentation for the patient's encounter via the notes activity.  2. Access  this query again and click edit on the In [employer].  3. After updating, or not, click F2 to complete all highlighted (required) fields concerning your review. Select "additional documentation in the medical record" OR "no additional documentation provided".  4. Click Sign note button.  5. The deficiency will fall out of your In Basket *Please let us know if you are not able to complete this workflow by phone or e-mail (listed below).

## 2012-09-21 NOTE — Consult Note (Addendum)
PCP:   Irena Cords, MD   Consulting physician: Dr Lindie Spruce  Reason for consultation: Hyperglycemia, syncope     HPI:  72 year old female with a history of diabetes mellitus, orthostatic hypotension who has been on Florinef 0.1 mg every other day for past many months, history of CVA currently on anticoagulation, end-stage renal disease, status post kidney transplant in December 2001, uropathy, CAD status post CABG, peripheral vascular disease status post left BKA, atrial fibrillation who was brought to the hospital after patient was found on the floor by patient's husband. EMS was called. Patient's blood glucose was found to be 47. Patient has been taking Lantus 10 units in the morning and has been taking NovoLog sliding scale before meals. As per husband he checked patient's blood sugar at lunch time yesterday and it was 147 for that he gave her 4 units of NovoLog. As per husband the patient was not able to get snacks after lunchtime so she might have dropped the blood sugars. This morning patient also was found to be 17, when patient came to the hospital she was started on NovoLog sliding scale every 4 hours and patient did receive insulin this morning around 4 AM when her blood sugar was 253. Patient has not been eating since she came to the hospital. At the time of my examination patient is somnolent unable to provide any history of restricted from patient's husband and medical records. Patient blood pressure has been stable.  Allergies:   Allergies  Allergen Reactions  . Percocet (Oxycodone-Acetaminophen) Nausea And Vomiting      Past Medical History  Diagnosis Date  . Coronary artery disease   . Chronic kidney disease     s/p cadaveric renal transplant in 2001  . Diabetes mellitus   . PVD (peripheral vascular disease)   . History of orthostatic hypotension   . Hyperlipidemia   . Hypothyroidism   . Anemia   . Pancreatic cyst   . Osteoporosis     recurrent fractures  .  Melanoma   . Carotid bruit     left  . Adenomatous polyp   . GERD (gastroesophageal reflux disease)   . Gastritis   . CVA (cerebral infarction)   . Bradycardia   . Renal artery stenosis   . Diabetic retinopathy(362.0)   . Renal osteodystrophy   . Hyperparathyroidism   . Leg ulcer     from poor fitting prosthetic  . H/O immunosuppressive therapy   . 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 Fx wrist   . Fx ankle   . Neuropathy   . Nephropathy   . Carotid artery stenosis     mild  . Stroke   . Tubular adenoma   . Colitis   . IBS (irritable bowel syndrome)   . Ischemic colitis   . 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 Esophageal candidiasis   . Neurogenic pain-esophagus 09/04/2012    Past Surgical History  Procedure Date  . Kidney transplant 08/15/2000    [facility] in [place]  . 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 Appendectomy   . Cataract extraction   . Cholecystectomy   . Abdominal hysterectomy   . Thyroid surgery   . Tonsillectomy   . Cesarean section     x 2  . Vitrectomy  right eye  . Coronary artery bypass graft   . Colonoscopy w/ biopsies and polypectomy 02/18/2009    adenomatous polyps, diverticulosis, internal hemorrhoids  . Eus 02/04/2010    w/FNA, pancreatic cyst  . Upper gastrointestinal endoscopy 12/01/2009  . Colonoscopy 01/30/2012    Procedure: COLONOSCOPY;  Surgeon: Rachael Fee, MD;  Location: [facility] ENDOSCOPY;  Service: Endoscopy;  Laterality: N/A;  . Esophagogastroduodenoscopy 05/02/2012    Procedure: ESOPHAGOGASTRODUODENOSCOPY (EGD);  Surgeon: Iva Boop, MD;  Location: [facility] ENDOSCOPY;  Service: Endoscopy;  Laterality: N/A;    Prior to Admission medications   Medication Sig Start Date End Date Taking? Authorizing Provider  amitriptyline (ELAVIL) 25 MG tablet Take 1 tablet (25 mg total) by mouth at  bedtime. 09/04/12  Yes Iva Boop, MD  atorvastatin (LIPITOR) 10 MG tablet Take 10 mg by mouth every other day.    Yes Historical Provider, MD  Calcium Carbonate-Vitamin D (CALCIUM 600-D) 600-400 MG-UNIT per tablet Take 1 tablet by mouth daily.    Yes Historical Provider, MD  Cholecalciferol (VITAMIN D3 SUPER STRENGTH) 2000 UNITS TABS Take 1 tablet by mouth daily at 12 noon.    Yes Historical Provider, MD  Cholecalciferol (VITAMIN D3) 400 UNITS CAPS Take 800 Units by mouth at bedtime.   Yes Historical Provider, MD  fludrocortisone (FLORINEF) 0.1 MG tablet Take 0.1 mg by mouth every other day.    Yes Historical Provider, MD  hyoscyamine (LEVSIN SL) 0.125 MG SL tablet Place 0.125 mg under the tongue daily as needed. For IBS   Yes Historical Provider, MD  insulin glargine (LANTUS) 100 UNIT/ML injection Inject 10 Units into the skin every morning.    Yes Historical Provider, MD  insulin lispro (HUMALOG) 100 UNIT/ML injection Inject 2-9 Units into the skin 4 (four) times daily -  before meals and at bedtime. Per sliding scale.   Yes Historical Provider, MD  levothyroxine (SYNTHROID, LEVOTHROID) 150 MCG tablet Take 150 mcg by mouth See admin instructions. Take 1 tablet daily Monday through Friday   Yes Historical Provider, MD  levothyroxine (SYNTHROID, LEVOTHROID) 175 MCG tablet Take 175 mcg by mouth See admin instructions. 1 tablet daily on Saturday and Sunday   Yes Historical Provider, MD  Magnesium 250 MG TABS Take 250 mg by mouth daily.   Yes Historical Provider, MD  Multiple Vitamin (MULTIVITAMIN WITH MINERALS) TABS Take 1 tablet by mouth daily.   Yes Historical Provider, MD  mycophenolate (MYFORTIC) 360 MG TBEC Take 360 mg by mouth 2 (two) times daily.    Yes Historical Provider, MD  omeprazole (PRILOSEC) 20 MG capsule Take 20 mg by mouth every morning.    Yes Historical Provider, MD  predniSONE (DELTASONE) 5 MG tablet Take 5 mg by mouth every morning.    Yes Historical Provider, MD  tacrolimus  (PROGRAF) 0.5 MG capsule Take 1-2 mg by mouth 2 (two) times daily. Take 2mg  in the am & 1mg  at night   Yes Historical Provider, MD  warfarin (COUMADIN) 2.5 MG tablet Take 5 mg by mouth every evening. Take as directed by anticoagulation clinic 08/01/12  Yes Antonieta Iba, MD    Social History:  reports that she has never smoked. She has never used smokeless tobacco. She reports that she does not drink alcohol or use illicit drugs.  Family History  Problem Relation Age of Onset  . Stroke Mother 84  . Heart attack Father 62  . Colon cancer Neg Hx   . Diabetes Neg Hx  Review of Systems:  Unobtainable at this time   Physical Exam: Blood pressure 135/72, pulse 58, temperature 96.7 F (35.9 C), temperature source Oral, resp. rate 18, SpO2 100.00%. Constitutional:   Patient is a well-developed and well-nourished female in no acute distress. Head: Normocephalic and atraumatic Mouth: Mucus membranes moist Neck: Supple, No Thyromegaly Cardiovascular: RRR, S1 normal, S2 normal Pulmonary/Chest: CTAB, no wheezes, rales, or rhonchi Abdominal: Soft. Non-tender, non-distended, bowel sounds are normal, no masses, organomegaly, or guarding present.  Neurological:  somnolent, patient received 1 dose of morphine Extremities : No Cyanosis, Clubbing or Edema   Labs on Admission:  Results for orders placed during the hospital encounter of 09/20/12 (from the past 48 hour(s))  TROPONIN I     Status: Normal   Collection Time   09/20/12  5:23 PM      Component Value Range Comment   Troponin I <0.30  <0.30 ng/mL   CK TOTAL AND CKMB     Status: Abnormal   Collection Time   09/20/12  5:23 PM      Component Value Range Comment   Total CK 114  7 - 177 U/L    CK, MB 3.2  0.3 - 4.0 ng/mL    Relative Index 2.8 (*) 0.0 - 2.5   TYPE AND SCREEN     Status: Normal   Collection Time   09/20/12  5:29 PM      Component Value Range Comment   ABO/RH(D) O POS      Antibody Screen NEG      Sample Expiration  09/23/2012     CDS SEROLOGY     Status: Normal   Collection Time   09/20/12  5:36 PM      Component Value Range Comment   CDS serology specimen        Value: SPECIMEN WILL BE HELD FOR 14 DAYS IF TESTING IS REQUIRED  COMPREHENSIVE METABOLIC PANEL     Status: Abnormal   Collection Time   09/20/12  5:36 PM      Component Value Range Comment   Sodium 137  135 - 145 mEq/L    Potassium 4.1  3.5 - 5.1 mEq/L    Chloride 100  96 - 112 mEq/L    CO2 25  19 - 32 mEq/L    Glucose, Bld 220 (*) 70 - 99 mg/dL    BUN 21  6 - 23 mg/dL    Creatinine, Ser 1.61  0.50 - 1.10 mg/dL    Calcium 9.9  8.4 - 09.6 mg/dL    Total Protein 6.3  6.0 - 8.3 g/dL    Albumin 3.6  3.5 - 5.2 g/dL    AST 27  0 - 37 U/L    ALT 16  0 - 35 U/L    Alkaline Phosphatase 63  39 - 117 U/L    Total Bilirubin 0.5  0.3 - 1.2 mg/dL    GFR calc non Af Amer 69 (*) >90 mL/min    GFR calc Af Amer 80 (*) >90 mL/min   CBC     Status: Normal   Collection Time   09/20/12  5:36 PM      Component Value Range Comment   WBC 10.0  4.0 - 10.5 K/uL    RBC 4.35  3.87 - 5.11 MIL/uL    Hemoglobin 12.9  12.0 - 15.0 g/dL    HCT 04.5  40.9 - 81.1 %    MCV 90.1  78.0 - 100.0 fL  MCH 29.7  26.0 - 34.0 pg    MCHC 32.9  30.0 - 36.0 g/dL    RDW 16.1  09.6 - 04.5 %    Platelets 167  150 - 400 K/uL   PROTIME-INR     Status: Abnormal   Collection Time   09/20/12  5:36 PM      Component Value Range Comment   Prothrombin Time 22.2 (*) 11.6 - 15.2 seconds    INR 2.04 (*) 0.00 - 1.49   APTT     Status: Normal   Collection Time   09/20/12  5:36 PM      Component Value Range Comment   aPTT 37  24 - 37 seconds   ETHANOL     Status: Normal   Collection Time   09/20/12  5:36 PM      Component Value Range Comment   Alcohol, Ethyl (B) <11  0 - 11 mg/dL   POCT I-STAT, CHEM 8     Status: Abnormal   Collection Time   09/20/12  5:46 PM      Component Value Range Comment   Sodium 137  135 - 145 mEq/L    Potassium 4.0  3.5 - 5.1 mEq/L    Chloride 101  96 -  112 mEq/L    BUN 23  6 - 23 mg/dL    Creatinine, Ser 4.09  0.50 - 1.10 mg/dL    Glucose, Bld 811 (*) 70 - 99 mg/dL    Calcium, Ion 9.14  7.82 - 1.30 mmol/L    TCO2 29  0 - 100 mmol/L    Hemoglobin 13.3  12.0 - 15.0 g/dL    HCT 95.6  21.3 - 08.6 %   CG4 I-STAT (LACTIC ACID)     Status: Abnormal   Collection Time   09/20/12  5:48 PM      Component Value Range Comment   Lactic Acid, Venous 2.27 (*) 0.5 - 2.2 mmol/L   GLUCOSE, CAPILLARY     Status: Abnormal   Collection Time   09/20/12  7:51 PM      Component Value Range Comment   Glucose-Capillary 162 (*) 70 - 99 mg/dL   URINALYSIS, ROUTINE W REFLEX MICROSCOPIC     Status: Abnormal   Collection Time   09/20/12  8:47 PM      Component Value Range Comment   Color, Urine YELLOW  YELLOW    APPearance CLOUDY (*) CLEAR    Specific Gravity, Urine 1.017  1.005 - 1.030    pH 6.5  5.0 - 8.0    Glucose, UA NEGATIVE  NEGATIVE mg/dL    Hgb urine dipstick NEGATIVE  NEGATIVE    Bilirubin Urine NEGATIVE  NEGATIVE    Ketones, ur NEGATIVE  NEGATIVE mg/dL    Protein, ur NEGATIVE  NEGATIVE mg/dL    Urobilinogen, UA 0.2  0.0 - 1.0 mg/dL    Nitrite NEGATIVE  NEGATIVE    Leukocytes, UA SMALL (*) NEGATIVE   URINE RAPID DRUG SCREEN (HOSP PERFORMED)     Status: Normal   Collection Time   09/20/12  8:47 PM      Component Value Range Comment   Opiates NONE DETECTED  NONE DETECTED    Cocaine NONE DETECTED  NONE DETECTED    Benzodiazepines NONE DETECTED  NONE DETECTED    Amphetamines NONE DETECTED  NONE DETECTED    Tetrahydrocannabinol NONE DETECTED  NONE DETECTED    Barbiturates NONE DETECTED  NONE DETECTED   URINE MICROSCOPIC-ADD  ON     Status: Abnormal   Collection Time   09/20/12  8:47 PM      Component Value Range Comment   Squamous Epithelial / LPF MANY (*) RARE    WBC, UA 0-2  <3 WBC/hpf    RBC / HPF 0-2  <3 RBC/hpf    Bacteria, UA RARE  RARE    Urine-Other AMORPHOUS URATES/PHOSPHATES     MRSA PCR SCREENING     Status: Normal   Collection Time    09/20/12  9:57 PM      Component Value Range Comment   MRSA by PCR NEGATIVE  NEGATIVE   GLUCOSE, CAPILLARY     Status: Abnormal   Collection Time   09/20/12 11:13 PM      Component Value Range Comment   Glucose-Capillary 200 (*) 70 - 99 mg/dL   URINALYSIS, ROUTINE W REFLEX MICROSCOPIC     Status: Abnormal   Collection Time   09/21/12  1:15 AM      Component Value Range Comment   Color, Urine YELLOW  YELLOW    APPearance CLEAR  CLEAR    Specific Gravity, Urine 1.018  1.005 - 1.030    pH 6.5  5.0 - 8.0    Glucose, UA NEGATIVE  NEGATIVE mg/dL    Hgb urine dipstick NEGATIVE  NEGATIVE    Bilirubin Urine NEGATIVE  NEGATIVE    Ketones, ur 15 (*) NEGATIVE mg/dL    Protein, ur NEGATIVE  NEGATIVE mg/dL    Urobilinogen, UA 0.2  0.0 - 1.0 mg/dL    Nitrite NEGATIVE  NEGATIVE    Leukocytes, UA NEGATIVE  NEGATIVE MICROSCOPIC NOT DONE ON URINES WITH NEGATIVE PROTEIN, BLOOD, LEUKOCYTES, NITRITE, OR GLUCOSE <1000 mg/dL.  CBC     Status: Abnormal   Collection Time   09/21/12  4:23 AM      Component Value Range Comment   WBC 7.5  4.0 - 10.5 K/uL    RBC 3.80 (*) 3.87 - 5.11 MIL/uL    Hemoglobin 11.2 (*) 12.0 - 15.0 g/dL    HCT 16.1 (*) 09.6 - 46.0 %    MCV 90.3  78.0 - 100.0 fL    MCH 29.5  26.0 - 34.0 pg    MCHC 32.7  30.0 - 36.0 g/dL    RDW 04.5  40.9 - 81.1 %    Platelets 135 (*) 150 - 400 K/uL   BASIC METABOLIC PANEL     Status: Abnormal   Collection Time   09/21/12  4:23 AM      Component Value Range Comment   Sodium 135  135 - 145 mEq/L    Potassium 4.4  3.5 - 5.1 mEq/L    Chloride 99  96 - 112 mEq/L    CO2 29  19 - 32 mEq/L    Glucose, Bld 276 (*) 70 - 99 mg/dL    BUN 24 (*) 6 - 23 mg/dL    Creatinine, Ser 9.14  0.50 - 1.10 mg/dL    Calcium 9.1  8.4 - 78.2 mg/dL    GFR calc non Af Amer 82 (*) >90 mL/min    GFR calc Af Amer >90  >90 mL/min   PROTIME-INR     Status: Abnormal   Collection Time   09/21/12  4:23 AM      Component Value Range Comment   Prothrombin Time 23.6 (*) 11.6 -  15.2 seconds    INR 2.21 (*) 0.00 - 1.49   GLUCOSE, CAPILLARY  Status: Abnormal   Collection Time   09/21/12  4:27 AM      Component Value Range Comment   Glucose-Capillary 253 (*) 70 - 99 mg/dL    Comment 1 Documented in Chart      Comment 2 Notify RN     GLUCOSE, CAPILLARY     Status: Abnormal   Collection Time   09/21/12  8:14 AM      Component Value Range Comment   Glucose-Capillary 17 (*) 70 - 99 mg/dL    Comment 1 Notify RN      Comment 2 Documented in Chart     GLUCOSE, CAPILLARY     Status: Abnormal   Collection Time   09/21/12 11:44 AM      Component Value Range Comment   Glucose-Capillary 101 (*) 70 - 99 mg/dL   GLUCOSE, CAPILLARY     Status: Abnormal   Collection Time   09/21/12  3:56 PM      Component Value Range Comment   Glucose-Capillary 176 (*) 70 - 99 mg/dL     Radiological Exams on Admission: Ct Abdomen Pelvis Wo Contrast  09/20/2012  *RADIOLOGY REPORT*  Clinical Data:  Fall, on Coumadin.  Right lower quadrant pain.  CT CHEST, ABDOMEN AND PELVIS WITHOUT CONTRAST  Technique:  Multidetector CT imaging of the chest, abdomen and pelvis was performed following the standard protocol without IV contrast.  Comparison:  01/27/2012.  CT CHEST  Findings:  Prior CABG.  Densely calcified mitral valve.  Heart is borderline in size.  Aorta is heavily calcified, non-aneurysmal.  Esophagus is dilated with air-fluid level in the upper to mid thoracic esophagus.  This may be related to dysmotility or reflux.  No mediastinal, hilar, or axillary adenopathy.  Visualized thyroid and chest wall soft tissues unremarkable.  Linear scarring or atelectasis in the left base.  Lungs otherwise clear.  No effusions.  No acute bony abnormality.  IMPRESSION: No acute findings in the chest.  CT ABDOMEN AND PELVIS  Findings:   Densely calcified aorta, iliac vessels and branch vessels.  Kidneys are severely atrophic.  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  Mild fullness of the renal  collecting system.  Urinary bladder is grossly unremarkable.  Moderate stool burden throughout the colon.  Liver, spleen, pancreas, adrenals have an unremarkable unenhanced appearance. Small bowel is decompressed.  No free fluid, free air or adenopathy.  Prior hysterectomy.  No adnexal masses. no evidence of abdominal or pelvic wall hematoma. No acute bony abnormality.  IMPRESSION: No acute findings in the abdomen or pelvis.   Original Report Authenticated By: Charlett Nose, M.D.    Ct Head Wo Contrast  09/21/2012  *RADIOLOGY REPORT*  Clinical Data: Altered mental status.  The patient fell with head trauma. On Coumadin.  Confusion.  CT HEAD WITHOUT CONTRAST  Technique:  Contiguous axial images were obtained from the base of the skull through the vertex without contrast.  Comparison: 09/20/2012  Findings: There is no acute intracranial hemorrhage, acute infarction, or mass lesion.  There are old left cerebellar and right occipital infarcts with secondary encephalomalacia.  Diffuse mild atrophy with secondary ventricular dilatation.  Multiple right facial fractures are again noted.  There is no opacification of the middle ear cavities.  IMPRESSION: No acute intracranial abnormality.  Multiple facial fractures as previously noted.   Original Report Authenticated By: Francene Boyers, M.D.    Ct Head Wo Contrast  09/20/2012  *RADIOLOGY REPORT*  Clinical Data:  Fall.  On  Coumadin.  Right facial swelling.  CT HEAD WITHOUT CONTRAST CT MAXILLOFACIAL WITHOUT CONTRAST CT CERVICAL SPINE WITHOUT CONTRAST  Technique:  Multidetector CT imaging of the head, cervical spine, and maxillofacial structures were performed using the standard protocol without intravenous contrast. Multiplanar CT image reconstructions of the cervical spine and maxillofacial structures were also generated.  Comparison:  11/10/2010  CT HEAD  Findings: Large old right posterior circulation infarct with encephalomalacia in the right occipital and medial  posterior temporal lobe.  Old left cerebellar infarct.  No acute infarction. No hemorrhage or hydrocephalus.  No acute calvarial abnormality.  IMPRESSION: No acute intracranial abnormality.  CT MAXILLOFACIAL  Findings:  Fractures noted through the anterior and lateral walls of the right maxillary sinus.  Blood within the maxillary sinus. The fracture extends through the inferior orbital rim.  Two fractures are noted within the mid and posterior right zygomatic arch.  Fracture or diastasis in the lateral right orbital wall.  Mandible is intact.  Blood noted within the right paranasal sinuses.  Soft tissue swelling within the left face.  Orbital soft tissues grossly unremarkable.  IMPRESSION: Fractures through the anterior and lateral walls of the right maxillary sinus, extending to involve the right inferior orbital rim.  Fractures within the right zygomatic arch and right lateral orbital wall.  Blood throughout the right paranasal sinuses.  CT CERVICAL SPINE  Findings:   Normal alignment.  Prevertebral soft tissues are normal.  Disc spaces are maintained.  No fracture.  No epidural or paraspinal hematoma.  Densely calcified vessels throughout the neck.  IMPRESSION: No acute bony abnormality.   Original Report Authenticated By: Charlett Nose, M.D.    Ct Chest Wo Contrast  09/20/2012  *RADIOLOGY REPORT*  Clinical Data:  Fall, on Coumadin.  Right lower quadrant pain.  CT CHEST, ABDOMEN AND PELVIS WITHOUT CONTRAST  Technique:  Multidetector CT imaging of the chest, abdomen and pelvis was performed following the standard protocol without IV contrast.  Comparison:  01/27/2012.  CT CHEST  Findings:  Prior CABG.  Densely calcified mitral valve.  Heart is borderline in size.  Aorta is heavily calcified, non-aneurysmal.  Esophagus is dilated with air-fluid level in the upper to mid thoracic esophagus.  This may be related to dysmotility or reflux.  No mediastinal, hilar, or axillary adenopathy.  Visualized thyroid and chest  wall soft tissues unremarkable.  Linear scarring or atelectasis in the left base.  Lungs otherwise clear.  No effusions.  No acute bony abnormality.  IMPRESSION: No acute findings in the chest.  CT ABDOMEN AND PELVIS  Findings:   Densely calcified aorta, iliac vessels and branch vessels.  Kidneys are severely atrophic.  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  Mild fullness of the renal collecting system.  Urinary bladder is grossly unremarkable.  Moderate stool burden throughout the colon.  Liver, spleen, pancreas, adrenals have an unremarkable unenhanced appearance. Small bowel is decompressed.  No free fluid, free air or adenopathy.  Prior hysterectomy.  No adnexal masses. no evidence of abdominal or pelvic wall hematoma. No acute bony abnormality.  IMPRESSION: No acute findings in the abdomen or pelvis.   Original Report Authenticated By: Charlett Nose, M.D.    Ct Cervical Spine Wo Contrast  09/20/2012  *RADIOLOGY REPORT*  Clinical Data:  Fall.  On Coumadin.  Right facial swelling.  CT HEAD WITHOUT CONTRAST CT MAXILLOFACIAL WITHOUT CONTRAST CT CERVICAL SPINE WITHOUT CONTRAST  Technique:  Multidetector CT imaging of the head, cervical spine, and maxillofacial structures were  performed using the standard protocol without intravenous contrast. Multiplanar CT image reconstructions of the cervical spine and maxillofacial structures were also generated.  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  Old left cerebellar infarct.  No acute infarction. No hemorrhage or hydrocephalus.  No acute calvarial abnormality.  IMPRESSION: No acute intracranial abnormality.  CT MAXILLOFACIAL  Findings:  Fractures noted through the anterior and lateral walls of the right maxillary sinus.  Blood within the maxillary sinus. The fracture extends through the inferior orbital rim.  Two fractures  are noted within the mid and posterior right zygomatic arch.  Fracture or diastasis in the lateral right orbital wall.  Mandible is intact.  Blood noted within the right paranasal sinuses.  Soft tissue swelling within the left face.  Orbital soft tissues grossly unremarkable.  IMPRESSION: Fractures through the anterior and lateral walls of the right maxillary sinus, extending to involve the right inferior orbital rim.  Fractures within the right zygomatic arch and right lateral orbital wall.  Blood throughout the right paranasal sinuses.  CT CERVICAL SPINE  Findings:   Normal alignment.  Prevertebral soft tissues are normal.  Disc spaces are maintained.  No fracture.  No epidural or paraspinal hematoma.  Densely calcified vessels throughout the neck.  IMPRESSION: No acute bony abnormality.   Original Report Authenticated By: Charlett Nose, M.D.    Dg Pelvis Portable  09/20/2012  *RADIOLOGY REPORT*  Clinical Data: Generalized pain and right hip pain status post fall  PORTABLE PELVIS  Comparison: Prior abdominal x-ray 01/27/2012, prior CT abdomen/pelvis 01/27/2012  Findings: The bones diffusely osteopenic.  No displaced fracture or definite lucency identified.  Extensive atherosclerotic calcifications throughout nearly all of the visualized arterial beds.  Numerous surgical clips project over the left abdomen and anatomic pelvis.  Moderately large volume of formed stool throughout the colon.  Bilateral hip joint osteoarthritis.  Remote healed fracture of the left inferior pubic ramus.  IMPRESSION:  1.  No acute fracture or malalignment identified.  The bones are diffusely osteopenic which limits evaluation for nondisplaced fracture.  If clinical concern persists, nuclear medicine bone scan or MRI could further evaluate. 2.  Remote healed fracture of the left inferior pubic ramus. 3.  Moderate to high volume of formed stool throughout the colon and rectum suggesting underlying constipation. 4.  Diffuse atherosclerotic  vascular calcifications.   Original Report Authenticated By: Malachy Moan, M.D.    Dg Hand 2 View Left  09/21/2012  *RADIOLOGY REPORT*  Clinical Data: Pain and swelling secondary to a fall.  LEFT HAND - 2 VIEW  Comparison: Radiographs dated 03/14/2007  Findings: There are old deformities of the distal left radius and ulna from prior fractures.  Because of the dorsal impaction of the distal radius the carpal bones are subluxed dorsally.  I suspect this is chronic.  There is diffuse osteopenia.  No acute fractures.  IMPRESSION: No acute abnormality.  Dorsal subluxation of the carpal bones with respect to the radius secondary to an old fracture.   Original Report Authenticated By: Francene Boyers, M.D.    Dg Chest Port 1 View  09/20/2012  *RADIOLOGY REPORT*  Clinical Data: Fall, generalized pain  PORTABLE CHEST - 1 VIEW  Comparison: Prior chest x-ray 12/14/2010  Findings: No pneumothorax or pleural effusion.  Unchanged borderline cardiomegaly.  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  Dense calcification of  the mitral valve annulus.  Atherosclerotic calcifications of the transverse aorta.  No pulmonary edema, focal airspace consolidation or suspicious pulmonary nodule.  No acute osseous abnormality.  IMPRESSION: No acute cardiopulmonary disease.   Original Report Authenticated By: Malachy Moan, M.D.    Ct Maxillofacial Wo Cm  09/20/2012  *RADIOLOGY REPORT*  Clinical Data:  Fall.  On Coumadin.  Right facial swelling.  CT HEAD WITHOUT CONTRAST CT MAXILLOFACIAL WITHOUT CONTRAST CT CERVICAL SPINE WITHOUT CONTRAST  Technique:  Multidetector CT imaging of the head, cervical spine, and maxillofacial structures were performed using the standard protocol without intravenous contrast. Multiplanar CT image reconstructions of the cervical spine and maxillofacial structures were also generated.  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  Old left cerebellar infarct.  No acute infarction. No hemorrhage or hydrocephalus.  No acute calvarial abnormality.  IMPRESSION: No acute intracranial abnormality.  CT MAXILLOFACIAL  Findings:  Fractures noted through the anterior and lateral walls of the right maxillary sinus.  Blood within the maxillary sinus. The fracture extends through the inferior orbital rim.  Two fractures are noted within the mid and posterior right zygomatic arch.  Fracture or diastasis in the lateral right orbital wall.  Mandible is intact.  Blood noted within the right paranasal sinuses.  Soft tissue swelling within the left face.  Orbital soft tissues grossly unremarkable.  IMPRESSION: Fractures through the anterior and lateral walls of the right maxillary sinus, extending to involve the right inferior orbital rim.  Fractures within the right zygomatic arch and right lateral orbital wall.  Blood throughout the right paranasal sinuses.  CT CERVICAL SPINE  Findings:   Normal alignment.  Prevertebral soft tissues are normal.  Disc spaces are maintained.  No fracture.  No epidural or paraspinal hematoma.  Densely calcified vessels throughout the neck.  IMPRESSION: No acute bony abnormality.   Original Report Authenticated By: Charlett Nose, M.D.     Assessment/Plan Active Problems:  Fall  Right orbit fracture  Zygoma fracture  Fracture of right maxilla  Concussion, unspecified  Hypoglycemia Mostly due to the insulin patient has been receiving in the hospital though she was kept n.p.o. We'll start statin scale of insulin and NovoLog to be given along with meals 3 times a day. We'll also get diabetes or better to discuss the patient and her husband and adjust sliding scale at home. Will check the glucose every hours for 4 hours. Also start patient on Lantus 10 units subcut in daily.  Questionable syncope Patient does have history  of severe orthostatic hypotension and has been taking Florinef 0.1 mg every other day. The patient's loss of consciousness is most likely due to the hyperglycemia but patient would benefit from outpatient Holter monitoring. Patient has a cardiologist in [place] and who can set up a Holter monitor as outpatient. In the meantime we'll continue to observe the patient on telemetry in the hospital.  Hypothyroidism Will obtain TSH, continue Synthroid  Orthostatic hypotension Continue Florinef 0.1 mg every other day  Status post renal transplant Continue prednisone, Prograf  Atrial  fibrillation Continue to hold Coumadin , INR is therapeutic Heart rate is controlled at this time Currently patient is in normal sinus rhythm  Time Spent  75 minutes  LAMA,GAGAN S [facility] Pager: [phone] 09/21/2012, 4:35 PM

## 2012-09-21 NOTE — Progress Notes (Addendum)
Follow up - Trauma and Critical Care  Patient Details:    Elizabeth [name] is an 72 y.o. female.  Lines/tubes :   Microbiology/Sepsis markers: Results for orders placed during the hospital encounter of 09/20/12  MRSA PCR SCREENING     Status: Normal   Collection Time   09/20/12  9:57 PM      Component Value Range Status Comment   MRSA by PCR NEGATIVE  NEGATIVE Final     Anti-infectives:  Anti-infectives     Start     Dose/Rate Route Frequency Ordered Stop   09/20/12 2330   ceFAZolin (ANCEF) IVPB 1 g/50 mL premix        1 g 100 mL/hr over 30 Minutes Intravenous 3 times per day 09/20/12 2319            Best Practice/Protocols:  VTE Prophylaxis: Mechanical GI Prophylaxis: Proton Pump Inhibitor and patient also is chronically anticoagulated. None  Consults:      Events:  Subjective:    Overnight Issues: Patient is confused and intermittently agitated.  Not following commands.  Pupils have changed and are currently non-reactive.  She has not received any sedation since 0300.  CBG 17?  Objective:  Vital signs for last 24 hours: Temp:  [97.6 F (36.4 C)] 97.6 F (36.4 C) (01/31 0428) Pulse Rate:  [64-117] 64  (01/31 0700) Resp:  [11-28] 25  (01/31 0700) BP: (82-194)/(28-98) 82/62 mmHg (01/31 0700) SpO2:  [94 %-100 %] 100 % (01/31 0700) FiO2 (%):  [28 %] 28 % (01/30 2320)  Hemodynamic parameters for last 24 hours:    Intake/Output from previous day: 01/30 0701 - 01/31 0700 In: 1303.8 [I.V.:803.8; IV Piggyback:100] Out: -   Intake/Output this shift:    Vent settings for last 24 hours: Vent Mode:  [-]  FiO2 (%):  [28 %] 28 %  Physical Exam:  General: No distress, sonorous respirations, will not respond Neuro: RASS -2 Resp: diminished breath sounds bilaterally and Nearly obstructed airway which decreased breath sounds bilaterally.  Rhonchorous breath sounds when airway open. CVS: IRR and intermittent Afib GI: soft, nontender, BS WNL, no  r/g Extremities: amputation present and Has acute ischemic disease of her RLE.  Results for orders placed during the hospital encounter of 09/20/12 (from the past 24 hour(s))  TROPONIN I     Status: Normal   Collection Time   09/20/12  5:23 PM      Component Value Range   Troponin I <0.30  <0.30 ng/mL  CK TOTAL AND CKMB     Status: Abnormal   Collection Time   09/20/12  5:23 PM      Component Value Range   Total CK 114  7 - 177 U/L   CK, MB 3.2  0.3 - 4.0 ng/mL   Relative Index 2.8 (*) 0.0 - 2.5  TYPE AND SCREEN     Status: Normal   Collection Time   09/20/12  5:29 PM      Component Value Range   ABO/RH(D) O POS     Antibody Screen NEG     Sample Expiration 09/23/2012    CDS SEROLOGY     Status: Normal   Collection Time   09/20/12  5:36 PM      Component Value Range   CDS serology specimen       Value: SPECIMEN WILL BE HELD FOR 14 DAYS IF TESTING IS REQUIRED  COMPREHENSIVE METABOLIC PANEL     Status: Abnormal   Collection Time  09/20/12  5:36 PM      Component Value Range   Sodium 137  135 - 145 mEq/L   Potassium 4.1  3.5 - 5.1 mEq/L   Chloride 100  96 - 112 mEq/L   CO2 25  19 - 32 mEq/L   Glucose, Bld 220 (*) 70 - 99 mg/dL   BUN 21  6 - 23 mg/dL   Creatinine, Ser 4.09  0.50 - 1.10 mg/dL   Calcium 9.9  8.4 - 81.1 mg/dL   Total Protein 6.3  6.0 - 8.3 g/dL   Albumin 3.6  3.5 - 5.2 g/dL   AST 27  0 - 37 U/L   ALT 16  0 - 35 U/L   Alkaline Phosphatase 63  39 - 117 U/L   Total Bilirubin 0.5  0.3 - 1.2 mg/dL   GFR calc non Af Amer 69 (*) >90 mL/min   GFR calc Af Amer 80 (*) >90 mL/min  CBC     Status: Normal   Collection Time   09/20/12  5:36 PM      Component Value Range   WBC 10.0  4.0 - 10.5 K/uL   RBC 4.35  3.87 - 5.11 MIL/uL   Hemoglobin 12.9  12.0 - 15.0 g/dL   HCT 91.4  78.2 - 95.6 %   MCV 90.1  78.0 - 100.0 fL   MCH 29.7  26.0 - 34.0 pg   MCHC 32.9  30.0 - 36.0 g/dL   RDW 21.3  08.6 - 57.8 %   Platelets 167  150 - 400 K/uL  PROTIME-INR     Status: Abnormal    Collection Time   09/20/12  5:36 PM      Component Value Range   Prothrombin Time 22.2 (*) 11.6 - 15.2 seconds   INR 2.04 (*) 0.00 - 1.49  APTT     Status: Normal   Collection Time   09/20/12  5:36 PM      Component Value Range   aPTT 37  24 - 37 seconds  ETHANOL     Status: Normal   Collection Time   09/20/12  5:36 PM      Component Value Range   Alcohol, Ethyl (B) <11  0 - 11 mg/dL  POCT I-STAT, CHEM 8     Status: Abnormal   Collection Time   09/20/12  5:46 PM      Component Value Range   Sodium 137  135 - 145 mEq/L   Potassium 4.0  3.5 - 5.1 mEq/L   Chloride 101  96 - 112 mEq/L   BUN 23  6 - 23 mg/dL   Creatinine, Ser 4.69  0.50 - 1.10 mg/dL   Glucose, Bld 629 (*) 70 - 99 mg/dL   Calcium, Ion 5.28  4.13 - 1.30 mmol/L   TCO2 29  0 - 100 mmol/L   Hemoglobin 13.3  12.0 - 15.0 g/dL   HCT 24.4  01.0 - 27.2 %  CG4 I-STAT (LACTIC ACID)     Status: Abnormal   Collection Time   09/20/12  5:48 PM      Component Value Range   Lactic Acid, Venous 2.27 (*) 0.5 - 2.2 mmol/L  GLUCOSE, CAPILLARY     Status: Abnormal   Collection Time   09/20/12  7:51 PM      Component Value Range   Glucose-Capillary 162 (*) 70 - 99 mg/dL  URINALYSIS, ROUTINE W REFLEX MICROSCOPIC     Status: Abnormal  Collection Time   09/20/12  8:47 PM      Component Value Range   Color, Urine YELLOW  YELLOW   APPearance CLOUDY (*) CLEAR   Specific Gravity, Urine 1.017  1.005 - 1.030   pH 6.5  5.0 - 8.0   Glucose, UA NEGATIVE  NEGATIVE mg/dL   Hgb urine dipstick NEGATIVE  NEGATIVE   Bilirubin Urine NEGATIVE  NEGATIVE   Ketones, ur NEGATIVE  NEGATIVE mg/dL   Protein, ur NEGATIVE  NEGATIVE mg/dL   Urobilinogen, UA 0.2  0.0 - 1.0 mg/dL   Nitrite NEGATIVE  NEGATIVE   Leukocytes, UA SMALL (*) NEGATIVE  URINE RAPID DRUG SCREEN (HOSP PERFORMED)     Status: Normal   Collection Time   09/20/12  8:47 PM      Component Value Range   Opiates NONE DETECTED  NONE DETECTED   Cocaine NONE DETECTED  NONE DETECTED    Benzodiazepines NONE DETECTED  NONE DETECTED   Amphetamines NONE DETECTED  NONE DETECTED   Tetrahydrocannabinol NONE DETECTED  NONE DETECTED   Barbiturates NONE DETECTED  NONE DETECTED  URINE MICROSCOPIC-ADD ON     Status: Abnormal   Collection Time   09/20/12  8:47 PM      Component Value Range   Squamous Epithelial / LPF MANY (*) RARE   WBC, UA 0-2  <3 WBC/hpf   RBC / HPF 0-2  <3 RBC/hpf   Bacteria, UA RARE  RARE   Urine-Other AMORPHOUS URATES/PHOSPHATES    MRSA PCR SCREENING     Status: Normal   Collection Time   09/20/12  9:57 PM      Component Value Range   MRSA by PCR NEGATIVE  NEGATIVE  GLUCOSE, CAPILLARY     Status: Abnormal   Collection Time   09/20/12 11:13 PM      Component Value Range   Glucose-Capillary 200 (*) 70 - 99 mg/dL  URINALYSIS, ROUTINE W REFLEX MICROSCOPIC     Status: Abnormal   Collection Time   09/21/12  1:15 AM      Component Value Range   Color, Urine YELLOW  YELLOW   APPearance CLEAR  CLEAR   Specific Gravity, Urine 1.018  1.005 - 1.030   pH 6.5  5.0 - 8.0   Glucose, UA NEGATIVE  NEGATIVE mg/dL   Hgb urine dipstick NEGATIVE  NEGATIVE   Bilirubin Urine NEGATIVE  NEGATIVE   Ketones, ur 15 (*) NEGATIVE mg/dL   Protein, ur NEGATIVE  NEGATIVE mg/dL   Urobilinogen, UA 0.2  0.0 - 1.0 mg/dL   Nitrite NEGATIVE  NEGATIVE   Leukocytes, UA NEGATIVE  NEGATIVE  CBC     Status: Abnormal   Collection Time   09/21/12  4:23 AM      Component Value Range   WBC 7.5  4.0 - 10.5 K/uL   RBC 3.80 (*) 3.87 - 5.11 MIL/uL   Hemoglobin 11.2 (*) 12.0 - 15.0 g/dL   HCT 16.1 (*) 09.6 - 04.5 %   MCV 90.3  78.0 - 100.0 fL   MCH 29.5  26.0 - 34.0 pg   MCHC 32.7  30.0 - 36.0 g/dL   RDW 40.9  81.1 - 91.4 %   Platelets 135 (*) 150 - 400 K/uL  BASIC METABOLIC PANEL     Status: Abnormal   Collection Time   09/21/12  4:23 AM      Component Value Range   Sodium 135  135 - 145 mEq/L   Potassium 4.4  3.5 - 5.1 mEq/L   Chloride 99  96 - 112 mEq/L   CO2 29  19 - 32 mEq/L    Glucose, Bld 276 (*) 70 - 99 mg/dL   BUN 24 (*) 6 - 23 mg/dL   Creatinine, Ser 1.61  0.50 - 1.10 mg/dL   Calcium 9.1  8.4 - 09.6 mg/dL   GFR calc non Af Amer 82 (*) >90 mL/min   GFR calc Af Amer >90  >90 mL/min  PROTIME-INR     Status: Abnormal   Collection Time   09/21/12  4:23 AM      Component Value Range   Prothrombin Time 23.6 (*) 11.6 - 15.2 seconds   INR 2.21 (*) 0.00 - 1.49  GLUCOSE, CAPILLARY     Status: Abnormal   Collection Time   09/21/12  4:27 AM      Component Value Range   Glucose-Capillary 253 (*) 70 - 99 mg/dL   Comment 1 Documented in Chart     Comment 2 Notify RN       Assessment/Plan:   NEURO  Altered Mental Status:  obtundation, dementia and not responding at all.   Plan: Probably get stat CT head.  PULM  No acute injury, but this patient will obstruct her airway easily.   Plan: Clarify code status, make sure that she is or is not to be intubated.  CARDIO  Atrial Fibrillation (with controlled ventricular response and without hemodynamic compromise) and Ventricular Premature Beats (without hemodynamic compromise)   Plan: CPM  RENAL  No current problems.Has functioning renal transplant.   Plan: CPM  GI  No specific issues   Plan: CPm  ID  No specific issues   Plan: CPM  HEME  Coagulopathy (coumadin administration)   Plan: Will get head DT and then decide if reversal is necessary  ENDO Hypoglycemia unknown cause   Plan: Start IVF with D5W, bolus D50W  Global Issues  This patient has facial fractures as a result of her fall which are the least of her concerns.  Her overall medical condition is poor, and we need medical service consultation.  Because of her mental status currently, will repeat head CT.    LOS: 1 day   Additional comments:I reviewed the patient's new clinical lab test results. cbc/bmet and I reviewed the patients new imaging test results. initial radiologic studies.  Critical Care Total Time*: 30 Minutes  Shawnta Schlegel  O 09/21/2012  *Care during the described time interval was provided by me and/or other providers on the critical care team.  I have reviewed this patient's available data, including medical history, events of note, physical examination and test results as part of my evaluation.   It appears as though the patient had an acute encephalopathic episode on top of chronic dementia.  The acute problem seemsed to resolve with glucose and the initial episode may have been due to severe hypoglycemia.  Marta Lamas. Gae Bon, MD, FACS [phone] Trauma Surgeon

## 2012-09-21 NOTE — Progress Notes (Signed)
UR complete 

## 2012-09-21 NOTE — Clinical Social Work Placement (Addendum)
    Clinical Social Work Department CLINICAL SOCIAL WORK PLACEMENT NOTE 09/21/2012  Patient:  LOULA, MARCELLA  Account Number:  [id number] Admit date:  09/20/2012  Clinical Social Worker:  [name], LCSW  Date/time:  09/21/2012 03:30 PM  Clinical Social Work is seeking post-discharge placement for this patient at the following level of care:   SKILLED NURSING   (*CSW will update this form in Epic as items are completed)   09/21/2012  Patient/family provided with [facility] Department of Clinical Social Work's list of facilities offering this level of care within the geographic area requested by the patient (or if unable, by the patient's family).  09/21/2012  Patient/family informed of their freedom to choose among providers that offer the needed level of care, that participate in Medicare, Medicaid or managed care program needed by the patient, have an available bed and are willing to accept the patient.  09/21/2012  Patient/family informed of [facility]' ownership interest in [facility], as well as of the fact that they are under no obligation to receive care at this facility.  PASARR submitted to EDS on 09/21/2012 PASARR number received from [employer] on 09/21/2012  FL2 transmitted to all facilities in geographic area requested by pt/family on  09/21/2012 FL2 transmitted to all facilities within larger geographic area on   Patient informed that his/her managed care company has contracts with or will negotiate with  certain facilities, including the following:     Patient/family informed of bed offers received:   Patient chooses bed at  Physician recommends and patient chooses bed at    Patient to be transferred to  on   Patient to be transferred to facility by   The following physician request were entered in Epic:   Additional Comments: 01/31 Patient has been to [facility] in the past and patient husband is agreeable to return if needed.  02/04 Faxed  to [employer] per family request

## 2012-09-21 NOTE — ED Provider Notes (Signed)
I saw and evaluated the patient, reviewed the resident's note and I agree with the findings and plan.  Head, neck, L arm, abdominal pain after fall from standing. Screaming in pain and difficult to evaluate. ABCS intact. GCS 14. Moving all extremities. Dried blood to face and nares. R periorbital ecchymosis. Diffuse abdominal tenderness. Pelvis stable but diffusely tender. On coumadin, amnestic to event. CT without contrast given renal transplant history. Vitals stable throughout ED course.  CRITICAL CARE Performed by: Glynn Octave   Total critical care time: 30  Critical care time was exclusive of separately billable procedures and treating other patients.  Critical care was necessary to treat or prevent imminent or life-threatening deterioration.  Critical care was time spent personally by me on the following activities: development of treatment plan with patient and/or surrogate as well as nursing, discussions with consultants, evaluation of patient's response to treatment, examination of patient, obtaining history from patient or surrogate, ordering and performing treatments and interventions, ordering and review of laboratory studies, ordering and review of radiographic studies, pulse oximetry and re-evaluation of patient's condition.   Glynn Octave, MD 09/21/12 [phone]

## 2012-09-22 DIAGNOSIS — S02400A Malar fracture unspecified, initial encounter for closed fracture: Secondary | ICD-10-CM

## 2012-09-22 LAB — URINE CULTURE: Colony Count: NO GROWTH

## 2012-09-22 LAB — GLUCOSE, CAPILLARY
Glucose-Capillary: 166 mg/dL — ABNORMAL HIGH (ref 70–99)
Glucose-Capillary: 217 mg/dL — ABNORMAL HIGH (ref 70–99)
Glucose-Capillary: 265 mg/dL — ABNORMAL HIGH (ref 70–99)
Glucose-Capillary: 270 mg/dL — ABNORMAL HIGH (ref 70–99)

## 2012-09-22 MED ORDER — SODIUM CHLORIDE 0.9 % IV SOLN
INTRAVENOUS | Status: DC
  Administered 2012-09-22: 1000 mL via INTRAVENOUS
  Administered 2012-09-23: 75 mL/h via INTRAVENOUS

## 2012-09-22 NOTE — Evaluation (Signed)
Physical Therapy Evaluation Patient Details Name: Elizabeth [name] MRN: [id number] DOB: 30-Jan-1941 Today's Date: 09/22/2012 Time: 4098-1191 PT Time Calculation (min): 17 min  PT Assessment / Plan / Recommendation Clinical Impression  Pt s/p fall with orthostasis.  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  Pt deconditioned after this fall and has some cognitive issues as well that have been worsening for some time now per chart.  May need NHP with therapy depending on how her cognition is as well as her progression of mobility.  Will need 24 hour care to continue.      PT Assessment  Patient needs continued PT services    Follow Up Recommendations  SNF;Supervision/Assistance - 24 hour                Equipment Recommendations  None recommended by PT         Frequency Min 3X/week    Precautions / Restrictions Precautions Precautions: Fall Restrictions Weight Bearing Restrictions: No   Pertinent Vitals/Pain VSS, Some generalized pain      Mobility  Bed Mobility Bed Mobility: Rolling Right;Right Sidelying to Sit;Sitting - Scoot to [place] of Bed Rolling Right: 1: +2 Total assist;With rail Rolling Right: Patient Percentage: 30% Right Sidelying to Sit: 1: +2 Total assist;With rails;HOB elevated Right Sidelying to Sit: Patient Percentage: 40% Sitting - Scoot to Edge of Bed: 1: +2 Total assist Sitting - Scoot to Edge of Bed: Patient Percentage: 50% Details for Bed Mobility Assistance: Pt needed cues for technique and sequencing movement to EOB.  Pt unable to plan motor movement and needed assist for anterior translation of trunk as well as assist to guide initiation of UE and LE to facilitate rolling.   Transfers Transfers: Sit to Stand;Stand to Sit;Stand Pivot Transfers Sit to Stand: 1: +2 Total assist;With upper extremity assist;From bed Sit to Stand: Patient Percentage: 50% Stand to Sit: 1: +2 Total assist;With upper extremity assist;With armrests;To chair/3-in-1 Stand to Sit: Patient  Percentage: 50% Stand Pivot Transfers: 1: +2 Total assist Stand Pivot Transfers: Patient Percentage: 60% Details for Transfer Assistance: Pt needed total assist to don right KAFO as well as to don left prosthesis.  Left prosthesis was loose - feel pt needed more socks for a better fit of prosthetic.  Pt needed incr assist for sit to stand as she needed facilitation for anterior translation to come to standing position.  Pt took incr time to achieve full standing. Once standing, pt needed assist to weight shift with facilitation of weight shift and cues to sequence steps.  With facilitation of hip and knee movement on right pt was able to take two steps around.  Had to assist with movement of left LE.  Pt not square with chair and began to sit too soon needing assist to control descent into chair.   Ambulation/Gait Ambulation/Gait Assistance: Not tested (comment) Stairs: No Wheelchair Mobility Wheelchair Mobility: No              PT Diagnosis: Generalized weakness  PT Problem List: Decreased activity tolerance;Decreased balance;Decreased mobility;Decreased safety awareness;Decreased knowledge of use of DME;Decreased knowledge of precautions;Decreased strength;Pain;Decreased cognition PT Treatment Interventions: DME instruction;Gait training;Functional mobility training;Therapeutic activities;Therapeutic exercise;Balance training;Patient/family education;Cognitive remediation   PT Goals Acute Rehab PT Goals PT Goal Formulation: With patient Time For Goal Achievement: 10/06/12 Potential to Achieve Goals: Good Pt will go Supine/Side to Sit: with supervision;with rail PT Goal: Supine/Side to Sit - Progress: Goal set today Pt will Sit at Edge of Bed: Independently;3-5 min;with bilateral upper  extremity support PT Goal: Sit at [facility] Of Bed - Progress: Goal set today Pt will go Sit to Stand: with min assist;with upper extremity assist PT Goal: Sit to Stand - Progress: Goal set today Pt will  Transfer Bed to Chair/Chair to Bed: with min assist PT Transfer Goal: Bed to Chair/Chair to Bed - Progress: Goal set today Pt will Ambulate: 1 - 15 feet;with min assist;with least restrictive assistive device PT Goal: Ambulate - Progress: Goal set today Pt will Perform Home Exercise Program: with supervision, verbal cues required/provided PT Goal: Perform Home Exercise Program - Progress: Goal set today  Visit Information  Last PT Received On: 09/22/12 Assistance Needed: +2    Subjective Data  Subjective: Pt confused.  Unaware she is in hospital.  Hallucinating Patient Stated Goal: Unable to assess due to cognition   Prior Functioning  Home Living Lives With: Spouse Available Help at Discharge: Family;Available 24 hours/day Type of Home: House Home Access:  (Husband able to get pt in PTA) Home Layout: One level Bathroom Toilet: Standard Home Adaptive Equipment: Walker - rolling Additional Comments: Chart reports that pt had all equipment for husband to care for her PTA.  He plans to care for her x 24 hours/day.  He is not present at this time.He only would leave her 1 hour at a time per chart.  She recently had been more demented and hallucitnating.   Prior Function Level of Independence: Independent with assistive device(s) Able to Take Stairs?: Yes Driving: No Vocation: Retired Musician: No difficulties    Cognition  Overall Cognitive Status: History of cognitive impairments - at baseline Area of Impairment: Attention;Memory;Following commands;Safety/judgement;Awareness of deficits;Problem solving Arousal/Alertness: Awake/alert (kept closing eyes throughout treatment) Orientation Level: Disoriented to;Place;Time;Situation Behavior During Session: Anxious Current Attention Level: Focused Attention - Other Comments: seconds only  Memory Deficits: poor short term and long term memory deficits Following Commands: Follows one step commands  inconsistently;Follows one step commands with increased time Safety/Judgement: Decreased safety judgement for tasks assessed;Decreased awareness of need for assistance Safety/Judgement - Other Comments: poor safety and poor judgment.   Awareness of Deficits: Pt with poor quality of movement Cognition - Other Comments: decreased    Extremity/Trunk Assessment Right Lower Extremity Assessment RLE ROM/Strength/Tone: Deficits RLE ROM/Strength/Tone Deficits: DF limited with foot drop noted, knee 3-/5, hip 3-/5 RLE Sensation: Deficits RLE Sensation Deficits: decreased to LTfoot to knee Left Lower Extremity Assessment LLE ROM/Strength/Tone: Deficits LLE ROM/Strength/Tone Deficits: BKA, hip and knee grossly 2+/5  Trunk Assessment Trunk Assessment: Kyphotic   Balance Static Sitting Balance Static Sitting - Balance Support: Bilateral upper extremity supported;Feet supported Static Sitting - Level of Assistance: 3: Mod assist Static Sitting - Comment/# of Minutes: Loses balance all directions x 2 minutes. Static Standing Balance Static Standing - Balance Support: Bilateral upper extremity supported;During functional activity Static Standing - Level of Assistance: 1: +2 Total assist (pt = 60%) Static Standing - Comment/# of Minutes: Needed bil UE support by PT and tech for stability  End of Session PT - End of Session Equipment Utilized During Treatment: Gait belt;Oxygen Activity Tolerance: Patient limited by fatigue;Patient limited by pain Patient left: in chair;with call bell/phone within reach;with nursing in room Nurse Communication: Mobility status       INGOLD,Jerrelle Michelsen 09/22/2012, 12:00 PM  [facility] Acute Rehabilitation [phone] (pager)

## 2012-09-22 NOTE — Evaluation (Signed)
Speech Language Pathology Evaluation Patient Details Name: Elizabeth [name] MRN: [id number] DOB: June 05, 1941 Today's Date: 09/22/2012 Time: 4098-1191 SLP Time Calculation (min): 36 min  Problem List:  Patient Active Problem List  Diagnosis  . 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 DIABETES MELLITUS, TYPE II  . HYPERLIPIDEMIA-MIXED  . HYPERTENSION, BENIGN  . CAD, NATIVE VESSEL  . SINUS BRADYCARDIA  . GERD  . Irritable bowel syndrome  . DIZZINESS  . CAROTID BRUITS, BILATERAL  . COLONIC POLYPS, HX OF  . Edema  . Atrial fibrillation  . Encounter for long-term (current) use of anticoagulants  . CVA (cerebral vascular accident)  . DVT of upper extremity (deep vein thrombosis)  . Nocturnal polyuria  . Constipation  . Arteriosclerosis, mesenteric artery  . Orthostatic hypotension  . Neurogenic pain-esophagus  . Fall  . Right orbit fracture  . Zygoma fracture  . Fracture of right maxilla  . Concussion, unspecified   Past Medical History:  Past Medical History  Diagnosis Date  . Coronary artery disease   . Chronic kidney disease     s/p cadaveric renal transplant in 2001  . Diabetes mellitus   . PVD (peripheral vascular disease)   . History of orthostatic hypotension   . Hyperlipidemia   . Hypothyroidism   . Anemia   . Pancreatic cyst   . Osteoporosis     recurrent fractures  . Melanoma   . Carotid bruit     left  . Adenomatous polyp   . GERD (gastroesophageal reflux disease)   . Gastritis   . CVA (cerebral infarction)   . Bradycardia   . Renal artery stenosis   . Diabetic retinopathy(362.0)   . Renal osteodystrophy   . Hyperparathyroidism   . Leg ulcer     from poor fitting prosthetic  . H/O immunosuppressive therapy   . 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 Fx wrist   . Fx ankle   . Neuropathy   . Nephropathy   . Carotid artery stenosis     mild  . Stroke   . Tubular adenoma   . Colitis   .  IBS (irritable bowel syndrome)   . Ischemic colitis   . 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 Esophageal candidiasis   . Neurogenic pain-esophagus 09/04/2012   Past Surgical History:  Past Surgical History  Procedure Date  . Kidney transplant 08/15/2000    [facility] in [place]  . 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 Appendectomy   . Cataract extraction   . Cholecystectomy   . Abdominal hysterectomy   . Thyroid surgery   . Tonsillectomy   . Cesarean section     x 2  . Vitrectomy     right eye  . Coronary artery bypass graft   . Colonoscopy w/ biopsies and polypectomy 02/18/2009    adenomatous polyps, diverticulosis, internal hemorrhoids  . Eus 02/04/2010    w/FNA, pancreatic cyst  . Upper gastrointestinal endoscopy 12/01/2009  . Colonoscopy 01/30/2012    Procedure: COLONOSCOPY;  Surgeon: Rachael Fee, MD;  Location: [facility] ENDOSCOPY;  Service: Endoscopy;  Laterality: N/A;  . Esophagogastroduodenoscopy 05/02/2012    Procedure: ESOPHAGOGASTRODUODENOSCOPY (EGD);  Surgeon: Iva Boop, MD;  Location: [facility] ENDOSCOPY;  Service: Endoscopy;  Laterality: N/A;   HPI:  This  is a 72 year old female who ambulates with a walker who presents after a fall. This was not witnessed. I obtained most of the history from the chart and her husband that she is sedated. It is uncertain malignant time she was found down. It is presumed that she hit her walker on her abdomen when she fell. She was complaining of headaches and some neck pain and right-sided abdominal wall pain.  CT scan indicates no acute intracranial abnormality.  Patient given medication to manage agitation in ED.  Patient referred for Cognitive Evaluation due to concerns with AMS s/p fall.  Assessment / Plan / Recommendation Clinical Impression  Severe cognitive deficits in areas of orientation, attention,  intellectual awareness, problem solving and reasoning.  Evaluation limited due to fluctuating LOA.  Patient pleasantly confused and stated she thought she was at "Dr.'s Oz's house and that treating SLP was Dr. Shaune Pascal daughter.   Recommend continued ST treatment in acute care setting to address cognitive deficits to improve safety in current setting.      SLP Assessment  Patient needs continued Speech Lanaguage Pathology Services    Follow Up Recommendations  Inpatient Rehab    Frequency and Duration min 2x/week  2 weeks      SLP Goals  SLP Goals Potential to Achieve Goals: Good Potential Considerations: Co-morbidities;Previous level of function;Family/community support Progress/Goals/Alternative treatment plan discussed with pt/caregiver and they: Agree SLP Goal #1: Improve orientation skills to place, time and situation with moderate verbal and visual cues  SLP Goal #2: Complete target functional problem tasks with moderate to max verbal, visual and tactile cues.  SLP Goal #3: Improve intellectual awareness by stating current physical deficits with moderate verbal and tactile cues.   SLP Evaluation Prior Functioning  Cognitive/Linguistic Baseline: Baseline deficits Baseline deficit details: Short term memory deficits, hallucinations Type of Home: House Lives With: Spouse Available Help at Discharge: Family;Available 24 hours/day Vocation: Retired   IT consultant  Overall Cognitive Status: Impaired Arousal/Alertness: Lethargic Orientation Level: Oriented to person Attention: Focused Focused Attention: Impaired Focused Attention Impairment: Verbal basic;Functional basic Memory: Impaired Memory Impairment: Storage deficit;Retrieval deficit;Decreased recall of new information;Decreased long term memory Decreased Long Term Memory: Verbal basic;Functional basic Awareness: Impaired Awareness Impairment: Intellectual impairment;Emergent impairment;Anticipatory impairment Problem Solving:  Impaired Problem Solving Impairment: Verbal basic;Functional basic Executive Function: Reasoning;Initiating Reasoning: Impaired Reasoning Impairment: Verbal basic;Functional basic Initiating: Impaired Initiating Impairment: Verbal basic;Functional basic Behaviors: Confabulation Safety/Judgment: Impaired    Comprehension  Auditory Comprehension Yes/No Questions: Impaired Basic Biographical Questions: 0-25% accurate Basic Immediate Environment Questions: 0-24% accurate Commands: Impaired One Step Basic Commands: 0-24% accurate Conversation: Simple Interfering Components: Attention Visual Recognition/Discrimination Discrimination: Not tested Reading Comprehension Reading Status: Not tested    Expression Expression Primary Mode of Expression: Verbal Verbal Expression Overall Verbal Expression: Appears within functional limits for tasks assessed Written Expression Written Expression: Not tested   Oral / Motor Oral Motor/Sensory Function Overall Oral Motor/Sensory Function: Appears within functional limits for tasks assessed Motor Speech Overall Motor Speech: Appears within functional limits for tasks assessed   GO    Moreen Fowler MS, CCC-SLP [phone] [facility] 09/22/2012, 2:02 PM

## 2012-09-22 NOTE — Consult Note (Signed)
NAMESHIRLEYMAE, HAUTH            ACCOUNT NO.:  [id number]  MEDICAL RECORD NO.:  [id number]  LOCATION:  2103                         FACILITY:  [facility]  PHYSICIAN:  Lyndal Pulley. Chales Salmon, M.D.   DATE OF BIRTH:  1940/12/01  DATE OF CONSULTATION:  09/21/2012 DATE OF DISCHARGE:                                CONSULTATION   I was asked to evaluate Mrs. Baskette, 72 year old lady, who had fallen on September 20, 2012, while in her kitchen, striking her face.  Her husband found her face down.  On her initial emergency department evaluation, a maxillofacial CT scan reportedly revealed right facial fractures.  On examination, the patient has obvious right facial swelling with right periorbital ecchymosis and mild right subconjunctival ecchymosis.  On palpation, there was no step deformities of supra or infraorbital rims. Her zygoma was not mobile to palpation and not obviously depressed or displaced. Intraorally, her occlusion appeared normal and stable and her maxilla was stable to palpation.  PERRL.Her extraocular movements were intact and there was no proptosis.  Her maxillofacial CT scan was evaluated and revealed a right maxillary nondisplaced fracture which involved the right zygoma and orbital floor. All of the fractures were minimally displaced, and there was no obvious muscle entrapment of the extraocular muscles.  ASSESSMENT AND PLAN:  With minimally displaced right facial fractures and no extraocular muscle involvements, No surgical intervention is necessary.  The findings were discussed with her husband and he understood.  Questions were invited and answered as well concerning her facial fractures.     Lyndal Pulley Chales Salmon, M.D.     TGO/MEDQ  D:  09/21/2012  T:  09/22/2012  Job:  [id number]

## 2012-09-22 NOTE — Progress Notes (Signed)
Patient ID: Elizabeth [name], female   DOB: [birth year], 72 y.o.   MRN: [id number] Follow up - Trauma and Critical Care  Patient Details:    Elizabeth [name] is an 72 y.o. female.  Lines/tubes :   Microbiology/Sepsis markers: Results for orders placed during the hospital encounter of 09/20/12  MRSA PCR SCREENING     Status: Normal   Collection Time   09/20/12  9:57 PM      Component Value Range Status Comment   MRSA by PCR NEGATIVE  NEGATIVE Final   URINE CULTURE     Status: Normal   Collection Time   09/21/12  1:15 AM      Component Value Range Status Comment   Specimen Description URINE, CATHETERIZED   Final    Special Requests Normal   Final    Culture  Setup Time 09/21/2012 02:11   Final    Colony Count NO GROWTH   Final    Culture NO GROWTH   Final    Report Status 09/22/2012 FINAL   Final     Anti-infectives:  Anti-infectives     Start     Dose/Rate Route Frequency Ordered Stop   09/20/12 2330   ceFAZolin (ANCEF) IVPB 1 g/50 mL premix  Status:  Discontinued        1 g 100 mL/hr over 30 Minutes Intravenous 3 times per day 09/20/12 2319 09/22/12 0801          Best Practice/Protocols:  VTE Prophylaxis: Mechanical GI Prophylaxis: Proton Pump Inhibitor and patient also is chronically anticoagulated. None  Consults: Treatment Team:  Sibyl Parr, MD    Events:  Subjective:    Overnight Issues: Patient is confused and intermittently agitated.  Blood sugars in high 100s and 200s. Repeat head CT yesterday still negative. Nurse reports pt ate >50% meals  Objective:  Vital signs for last 24 hours: Temp:  [96.7 F (35.9 C)-99.2 F (37.3 C)] 98.8 F (37.1 C) (02/01 0801) Pulse Rate:  [58-87] 67  (02/01 0700) Resp:  [12-31] 13  (02/01 0700) BP: (94-168)/(40-119) 156/44 mmHg (02/01 0700) SpO2:  [92 %-100 %] 96 % (02/01 0700)  Hemodynamic parameters for last 24 hours:    Intake/Output from previous day: 01/31 0701 - 02/01 0700 In: 1670 [P.O.:120;  I.V.:1500; IV Piggyback:50] Out: 910 [Urine:910]  Intake/Output this shift:    Vent settings for last 24 hours:    Physical Exam:  Asleep, easily arousable.  Confused. Oriented to person only, not year, city, state. Follows commands x 3. Rt periorbital ecchymosis/eyelid edema. conjuctival hemorrhage. Pupils are irregular shape. Some reaction to light.  Coarse BS b/l Reg Soft, nt, nd C spine - denies pain. nontender on palpation. FROM without pain LLE amputation.  comfeel dressings on several areas of extremities Restrained.   Results for orders placed during the hospital encounter of 09/20/12 (from the past 24 hour(s))  GLUCOSE, CAPILLARY     Status: Abnormal   Collection Time   09/21/12  8:14 AM      Component Value Range   Glucose-Capillary 17 (*) 70 - 99 mg/dL   Comment 1 Notify RN     Comment 2 Documented in Chart    GLUCOSE, CAPILLARY     Status: Abnormal   Collection Time   09/21/12 10:18 AM      Component Value Range   Glucose-Capillary 36 (*) 70 - 99 mg/dL   Comment 1 Documented in Chart    HEMOGLOBIN A1C     Status:  Abnormal   Collection Time   09/21/12 10:44 AM      Component Value Range   Hemoglobin A1C 6.3 (*) <5.7 %   Mean Plasma Glucose 134 (*) <117 mg/dL  GLUCOSE, CAPILLARY     Status: Abnormal   Collection Time   09/21/12 11:44 AM      Component Value Range   Glucose-Capillary 101 (*) 70 - 99 mg/dL  GLUCOSE, CAPILLARY     Status: Abnormal   Collection Time   09/21/12  2:19 PM      Component Value Range   Glucose-Capillary 149 (*) 70 - 99 mg/dL  GLUCOSE, CAPILLARY     Status: Abnormal   Collection Time   09/21/12  3:56 PM      Component Value Range   Glucose-Capillary 176 (*) 70 - 99 mg/dL  GLUCOSE, CAPILLARY     Status: Abnormal   Collection Time   09/21/12  8:10 PM      Component Value Range   Glucose-Capillary 250 (*) 70 - 99 mg/dL  GLUCOSE, CAPILLARY     Status: Abnormal   Collection Time   09/21/12 11:42 PM      Component Value Range    Glucose-Capillary 265 (*) 70 - 99 mg/dL   Comment 1 Documented in Chart     Comment 2 Notify RN    GLUCOSE, CAPILLARY     Status: Abnormal   Collection Time   09/22/12  4:26 AM      Component Value Range   Glucose-Capillary 270 (*) 70 - 99 mg/dL   Comment 1 Documented in Chart     Comment 2 Notify RN       Assessment/Plan:   NEURO  Dementia, Delirium - improved from yesterday   Plan: no TBI, minimize narcotics, haldol prn  PULM  No acute injury, but this patient will obstruct her airway easily.   Plan: Clarify code status, make sure that she is or is not to be intubated.  CARDIO  Atrial Fibrillation (with controlled ventricular response and without hemodynamic compromise) and Ventricular Premature Beats (without hemodynamic compromise)   Plan: CPM  RENAL  No current problems.Has functioning renal transplant.   Plan: CPM  GI  No specific issues   Plan: CPm  ID  No specific issues   Plan: stop ancef  HEME  Coagulopathy (coumadin administration)   Plan: coumadin on hold. INR therapeutic. prob restart coumadin in next day or so  ENDO Diabetes mellitus. hyperglycemia   Plan: changed IVF to NS; cont SSI  Global Issues  This patient has facial fractures as a result of her fall which are the least of her concerns.  Her overall medical condition is poor, and we need medical service consultation.   Will tx to SDU.    LOS: 2 days   Additional comments:I reviewed the patient's new clinical lab test results. cbc/bmet and I reviewed the patients new imaging test results. initial radiologic studies.  Critical Care Total Time*: 35 Minutes  Elizabeth [name]. Elizabeth Campanile, MD, FACS General, Bariatric, & Minimally Invasive Surgery [facility], [place]  Gaynelle Adu M 09/22/2012  *Care during the described time interval was provided by me and/or other providers on the critical care team.  I have reviewed this patient's available data, including medical history, events of note, physical  examination and test results as part of my evaluation.

## 2012-09-22 NOTE — Progress Notes (Signed)
Subjective: Patient seen, blood glucose is better. Diabetes coordinator has discussed the insulin regimen with the patient's husband.   Objective: Vital signs in last 24 hours: Temp:  [96.7 F (35.9 C)-99.2 F (37.3 C)] 98.8 F (37.1 C) (02/01 0801) Pulse Rate:  [60-78] 67  (02/01 0700) Resp:  [12-31] 13  (02/01 0700) BP: (101-168)/(44-119) 156/44 mmHg (02/01 0700) SpO2:  [92 %-100 %] 96 % (02/01 0700) Weight change:      Intake/Output from previous day: 01/31 0701 - 02/01 0700 In: 1670 [P.O.:120; I.V.:1500; IV Piggyback:50] Out: 910 [Urine:910]     Physical Exam: Head: Normocephalic, atraumatic.  Eyes: No signs of jaundice, EOMI Nose: Mucous membranes dry.  Neck: supple,No deformities, masses, or tenderness noted. Lungs: Normal respiratory effort. B/L Clear to auscultation, no crackles or wheezes.  Heart: Regular RR. S1 and S2 normal  Abdomen: BS normoactive. Soft, Nondistended, non-tender.  Extremities: No pretibial edema, no erythema   Lab Results: Basic Metabolic Panel:  Basename 09/21/12 0423 09/20/12 1746 09/20/12 1736  NA 135 137 --  K 4.4 4.0 --  CL 99 101 --  CO2 29 -- 25  GLUCOSE 276* 217* --  BUN 24* 23 --  CREATININE 0.76 1.00 --  CALCIUM 9.1 -- 9.9  MG -- -- --  PHOS -- -- --   Liver Function Tests:  Basename 09/20/12 1736  AST 27  ALT 16  ALKPHOS 63  BILITOT 0.5  PROT 6.3  ALBUMIN 3.6   No results found for this basename: LIPASE:2,AMYLASE:2 in the last 72 hours No results found for this basename: AMMONIA:2 in the last 72 hours CBC:  Basename 09/21/12 0423 09/20/12 1746 09/20/12 1736  WBC 7.5 -- 10.0  NEUTROABS -- -- --  HGB 11.2* 13.3 --  HCT 34.3* 39.0 --  MCV 90.3 -- 90.1  PLT 135* -- 167   Cardiac Enzymes:  Basename 09/20/12 1723  CKTOTAL 114  CKMB 3.2  CKMBINDEX --  TROPONINI <0.30  :  Basename 09/22/12 0712 09/22/12 0426 09/21/12 2342 09/21/12 2010 09/21/12 1556 09/21/12 1419  GLUCAP 266* 270* 265* 250* 176* 149*    Hemoglobin A1C:  Basename 09/21/12 1044  HGBA1C 6.3*   Fasting Lipid Panel: No results found for this basename: CHOL,HDL,LDLCALC,TRIG,CHOLHDL,LDLDIRECT in the last 72 hours Thyroid Function Tests: No results found for this basename: TSH,T4TOTAL,FREET4,T3FREE,THYROIDAB in the last 72 hours Anemia Panel: No results found for this basename: VITAMINB12,FOLATE,FERRITIN,TIBC,IRON,RETICCTPCT in the last 72 hours Coagulation:  Basename 09/21/12 0423 09/20/12 1736  LABPROT 23.6* 22.2*  INR 2.21* 2.04*   Urine Drug Screen: Drugs of Abuse     Component Value Date/Time   LABOPIA NONE DETECTED 09/20/2012 2047   COCAINSCRNUR NONE DETECTED 09/20/2012 2047   LABBENZ NONE DETECTED 09/20/2012 2047   AMPHETMU NONE DETECTED 09/20/2012 2047   THCU NONE DETECTED 09/20/2012 2047   LABBARB NONE DETECTED 09/20/2012 2047    Alcohol Level:  Basename 09/20/12 1736  ETH <11   Urinalysis:  Basename 09/21/12 0115 09/20/12 2047  COLORURINE YELLOW YELLOW  LABSPEC 1.018 1.017  PHURINE 6.5 6.5  GLUCOSEU NEGATIVE NEGATIVE  HGBUR NEGATIVE NEGATIVE  BILIRUBINUR NEGATIVE NEGATIVE  KETONESUR 15* NEGATIVE  PROTEINUR NEGATIVE NEGATIVE  UROBILINOGEN 0.2 0.2  NITRITE NEGATIVE NEGATIVE  LEUKOCYTESUR NEGATIVE SMALL*   Misc. Labs:  Recent Results (from the past 240 hour(s))  MRSA PCR SCREENING     Status: Normal   Collection Time   09/20/12  9:57 PM      Component Value Range Status Comment   MRSA  by PCR NEGATIVE  NEGATIVE Final   URINE CULTURE     Status: Normal   Collection Time   09/21/12  1:15 AM      Component Value Range Status Comment   Specimen Description URINE, CATHETERIZED   Final    Special Requests Normal   Final    Culture  Setup Time 09/21/2012 02:11   Final    Colony Count NO GROWTH   Final    Culture NO GROWTH   Final    Report Status 09/22/2012 FINAL   Final     Studies/Results: Ct Abdomen Pelvis Wo Contrast  09/20/2012  *RADIOLOGY REPORT*  Clinical Data:  Fall, on Coumadin.   Right lower quadrant pain.  CT CHEST, ABDOMEN AND PELVIS WITHOUT CONTRAST  Technique:  Multidetector CT imaging of the chest, abdomen and pelvis was performed following the standard protocol without IV contrast.  Comparison:  01/27/2012.  CT CHEST  Findings:  Prior CABG.  Densely calcified mitral valve.  Heart is borderline in size.  Aorta is heavily calcified, non-aneurysmal.  Esophagus is dilated with air-fluid level in the upper to mid thoracic esophagus.  This may be related to dysmotility or reflux.  No mediastinal, hilar, or axillary adenopathy.  Visualized thyroid and chest wall soft tissues unremarkable.  Linear scarring or atelectasis in the left base.  Lungs otherwise clear.  No effusions.  No acute bony abnormality.  IMPRESSION: No acute findings in the chest.  CT ABDOMEN AND PELVIS  Findings:   Densely calcified aorta, iliac vessels and branch vessels.  Kidneys are severely atrophic.  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  Mild fullness of the renal collecting system.  Urinary bladder is grossly unremarkable.  Moderate stool burden throughout the colon.  Liver, spleen, pancreas, adrenals have an unremarkable unenhanced appearance. Small bowel is decompressed.  No free fluid, free air or adenopathy.  Prior hysterectomy.  No adnexal masses. no evidence of abdominal or pelvic wall hematoma. No acute bony abnormality.  IMPRESSION: No acute findings in the abdomen or pelvis.   Original Report Authenticated By: Charlett Nose, M.D.    Ct Head Wo Contrast  09/21/2012  *RADIOLOGY REPORT*  Clinical Data: Altered mental status.  The patient fell with head trauma. On Coumadin.  Confusion.  CT HEAD WITHOUT CONTRAST  Technique:  Contiguous axial images were obtained from the base of the skull through the vertex without contrast.  Comparison: 09/20/2012  Findings: There is no acute intracranial hemorrhage, acute infarction, or mass lesion.  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  Diffuse mild atrophy with secondary ventricular dilatation.  Multiple right facial fractures are again noted.  There is no opacification of the middle ear cavities.  IMPRESSION: No acute intracranial abnormality.  Multiple facial fractures as previously noted.   Original Report Authenticated By: Francene Boyers, M.D.    Ct Head Wo Contrast  09/20/2012  *RADIOLOGY REPORT*  Clinical Data:  Fall.  On Coumadin.  Right facial swelling.  CT HEAD WITHOUT CONTRAST CT MAXILLOFACIAL WITHOUT CONTRAST CT CERVICAL SPINE WITHOUT CONTRAST  Technique:  Multidetector CT imaging of the head, cervical spine, and maxillofacial structures were performed using the standard protocol without intravenous contrast. Multiplanar CT image reconstructions of the cervical spine and maxillofacial structures were also generated.  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  Old left cerebellar infarct.  No acute infarction. No hemorrhage or hydrocephalus.  No acute calvarial abnormality.  IMPRESSION: No acute intracranial abnormality.  CT MAXILLOFACIAL  Findings:  Fractures noted through the anterior and lateral walls of the right maxillary sinus.  Blood within the maxillary sinus. The fracture extends through the inferior orbital rim.  Two fractures are noted within the mid and posterior right zygomatic arch.  Fracture or diastasis in the lateral right orbital wall.  Mandible is intact.  Blood noted within the right paranasal sinuses.  Soft tissue swelling within the left face.  Orbital soft tissues grossly unremarkable.  IMPRESSION: Fractures through the anterior and lateral walls of the right maxillary sinus, extending to involve the right inferior orbital rim.  Fractures within the right zygomatic arch and right lateral orbital wall.  Blood throughout the right paranasal sinuses.  CT  CERVICAL SPINE  Findings:   Normal alignment.  Prevertebral soft tissues are normal.  Disc spaces are maintained.  No fracture.  No epidural or paraspinal hematoma.  Densely calcified vessels throughout the neck.  IMPRESSION: No acute bony abnormality.   Original Report Authenticated By: Charlett Nose, M.D.    Ct Chest Wo Contrast  09/20/2012  *RADIOLOGY REPORT*  Clinical Data:  Fall, on Coumadin.  Right lower quadrant pain.  CT CHEST, ABDOMEN AND PELVIS WITHOUT CONTRAST  Technique:  Multidetector CT imaging of the chest, abdomen and pelvis was performed following the standard protocol without IV contrast.  Comparison:  01/27/2012.  CT CHEST  Findings:  Prior CABG.  Densely calcified mitral valve.  Heart is borderline in size.  Aorta is heavily calcified, non-aneurysmal.  Esophagus is dilated with air-fluid level in the upper to mid thoracic esophagus.  This may be related to dysmotility or reflux.  No mediastinal, hilar, or axillary adenopathy.  Visualized thyroid and chest wall soft tissues unremarkable.  Linear scarring or atelectasis in the left base.  Lungs otherwise clear.  No effusions.  No acute bony abnormality.  IMPRESSION: No acute findings in the chest.  CT ABDOMEN AND PELVIS  Findings:   Densely calcified aorta, iliac vessels and branch vessels.  Kidneys are severely atrophic.  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  Mild fullness of the renal collecting system.  Urinary bladder is grossly unremarkable.  Moderate stool burden throughout the colon.  Liver, spleen, pancreas, adrenals have an unremarkable unenhanced appearance. Small bowel is decompressed.  No free fluid, free air or adenopathy.  Prior hysterectomy.  No adnexal masses. no evidence of abdominal or pelvic wall hematoma. No acute bony abnormality.  IMPRESSION: No acute findings in the abdomen or pelvis.   Original Report Authenticated By: Charlett Nose, M.D.    Ct Cervical Spine Wo Contrast  09/20/2012  *RADIOLOGY REPORT*   Clinical Data:  Fall.  On Coumadin.  Right facial swelling.  CT HEAD WITHOUT CONTRAST CT MAXILLOFACIAL WITHOUT CONTRAST CT CERVICAL SPINE WITHOUT CONTRAST  Technique:  Multidetector CT imaging of the head, cervical spine, and maxillofacial structures were performed using the standard protocol without intravenous contrast. Multiplanar CT image reconstructions of the cervical spine and maxillofacial structures were also generated.  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  Old left cerebellar infarct.  No acute infarction. No hemorrhage or hydrocephalus.  No acute calvarial abnormality.  IMPRESSION: No acute intracranial abnormality.  CT MAXILLOFACIAL  Findings:  Fractures noted through the anterior and lateral walls of the  right maxillary sinus.  Blood within the maxillary sinus. The fracture extends through the inferior orbital rim.  Two fractures are noted within the mid and posterior right zygomatic arch.  Fracture or diastasis in the lateral right orbital wall.  Mandible is intact.  Blood noted within the right paranasal sinuses.  Soft tissue swelling within the left face.  Orbital soft tissues grossly unremarkable.  IMPRESSION: Fractures through the anterior and lateral walls of the right maxillary sinus, extending to involve the right inferior orbital rim.  Fractures within the right zygomatic arch and right lateral orbital wall.  Blood throughout the right paranasal sinuses.  CT CERVICAL SPINE  Findings:   Normal alignment.  Prevertebral soft tissues are normal.  Disc spaces are maintained.  No fracture.  No epidural or paraspinal hematoma.  Densely calcified vessels throughout the neck.  IMPRESSION: No acute bony abnormality.   Original Report Authenticated By: Charlett Nose, M.D.    Dg Pelvis Portable  09/20/2012  *RADIOLOGY REPORT*  Clinical Data: Generalized pain and right hip  pain status post fall  PORTABLE PELVIS  Comparison: Prior abdominal x-ray 01/27/2012, prior CT abdomen/pelvis 01/27/2012  Findings: The bones diffusely osteopenic.  No displaced fracture or definite lucency identified.  Extensive atherosclerotic calcifications throughout nearly all of the visualized arterial beds.  Numerous surgical clips project over the left abdomen and anatomic pelvis.  Moderately large volume of formed stool throughout the colon.  Bilateral hip joint osteoarthritis.  Remote healed fracture of the left inferior pubic ramus.  IMPRESSION:  1.  No acute fracture or malalignment identified.  The bones are diffusely osteopenic which limits evaluation for nondisplaced fracture.  If clinical concern persists, nuclear medicine bone scan or MRI could further evaluate. 2.  Remote healed fracture of the left inferior pubic ramus. 3.  Moderate to high volume of formed stool throughout the colon and rectum suggesting underlying constipation. 4.  Diffuse atherosclerotic vascular calcifications.   Original Report Authenticated By: Malachy Moan, M.D.    Dg Hand 2 View Left  09/21/2012  *RADIOLOGY REPORT*  Clinical Data: Pain and swelling secondary to a fall.  LEFT HAND - 2 VIEW  Comparison: Radiographs dated 03/14/2007  Findings: There are old deformities of the distal left radius and ulna from prior fractures.  Because of the dorsal impaction of the distal radius the carpal bones are subluxed dorsally.  I suspect this is chronic.  There is diffuse osteopenia.  No acute fractures.  IMPRESSION: No acute abnormality.  Dorsal subluxation of the carpal bones with respect to the radius secondary to an old fracture.   Original Report Authenticated By: Francene Boyers, M.D.    Dg Chest Port 1 View  09/20/2012  *RADIOLOGY REPORT*  Clinical Data: Fall, generalized pain  PORTABLE CHEST - 1 VIEW  Comparison: Prior chest x-ray 12/14/2010  Findings: No pneumothorax or pleural effusion.  Unchanged borderline  cardiomegaly.  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  Dense calcification of the mitral valve annulus.  Atherosclerotic calcifications of the transverse aorta.  No pulmonary edema, focal airspace consolidation or suspicious pulmonary nodule.  No acute osseous abnormality.  IMPRESSION: No acute cardiopulmonary disease.   Original Report Authenticated By: Malachy Moan, M.D.    Ct Maxillofacial Wo Cm  09/20/2012  *RADIOLOGY REPORT*  Clinical Data:  Fall.  On Coumadin.  Right facial swelling.  CT HEAD WITHOUT CONTRAST CT MAXILLOFACIAL WITHOUT CONTRAST CT CERVICAL SPINE WITHOUT CONTRAST  Technique:  Multidetector CT imaging of the  head, cervical spine, and maxillofacial structures were performed using the standard protocol without intravenous contrast. Multiplanar CT image reconstructions of the cervical spine and maxillofacial structures were also generated.  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  Old left cerebellar infarct.  No acute infarction. No hemorrhage or hydrocephalus.  No acute calvarial abnormality.  IMPRESSION: No acute intracranial abnormality.  CT MAXILLOFACIAL  Findings:  Fractures noted through the anterior and lateral walls of the right maxillary sinus.  Blood within the maxillary sinus. The fracture extends through the inferior orbital rim.  Two fractures are noted within the mid and posterior right zygomatic arch.  Fracture or diastasis in the lateral right orbital wall.  Mandible is intact.  Blood noted within the right paranasal sinuses.  Soft tissue swelling within the left face.  Orbital soft tissues grossly unremarkable.  IMPRESSION: Fractures through the anterior and lateral walls of the right maxillary sinus, extending to involve the right inferior orbital rim.  Fractures within the right zygomatic  arch and right lateral orbital wall.  Blood throughout the right paranasal sinuses.  CT CERVICAL SPINE  Findings:   Normal alignment.  Prevertebral soft tissues are normal.  Disc spaces are maintained.  No fracture.  No epidural or paraspinal hematoma.  Densely calcified vessels throughout the neck.  IMPRESSION: No acute bony abnormality.   Original Report Authenticated By: Charlett Nose, M.D.     Medications: Scheduled Meds:   . fludrocortisone  0.1 mg Oral QODAY  . insulin aspart  1-5 Units Subcutaneous TID WC  . insulin glargine  10 Units Subcutaneous Daily  . levothyroxine  150 mcg Oral Custom  . levothyroxine  175 mcg Oral Custom  . mycophenolate  360 mg Oral BID  . neomycin-bacitracin-polymyxin   Topical Daily  . ondansetron (ZOFRAN) IV  4 mg Intravenous Once  . pantoprazole  40 mg Oral Daily   Or  . pantoprazole (PROTONIX) IV  40 mg Intravenous Daily  . predniSONE  5 mg Oral q morning - 10a  . tacrolimus  1 mg Oral QHS  . tacrolimus  2 mg Oral Daily   Continuous Infusions:   . sodium chloride 1,000 mL (09/22/12 0915)   PRN Meds:.acetaminophen, HYDROcodone-acetaminophen, morphine injection, morphine injection, ondansetron (ZOFRAN) IV, ondansetron  Assessment/Plan:  Active Problems:    Hypoglycemia  Resolved, most likely due to the insulin patient has been receiving in the hospital when she was n.p.o. Patient is not eating and her blood sugar has improved. Patient does have elevated blood glucose in 200s, and she is still on D5 half-normal saline. I will change the fluids to normal saline. Continue with the sliding scale insulin and Lantus 10 units daily.  Questionable syncope  Patient does have history of severe orthostatic hypotension and has been taking Florinef 0.1 mg every other day. The patient's loss of consciousness is most likely due to the hyperglycemia but patient would benefit from outpatient Holter monitoring. Patient has a cardiologist in [place] and who can  set up a Holter monitor as outpatient. In the meantime we'll continue to observe the patient on telemetry in the hospital.   Hypothyroidism  Will obtain TSH, continue Synthroid   Orthostatic hypotension  Continue Florinef 0.1 mg every other day   Status post renal transplant  Continue prednisone, Prograf   Atrial fibrillation  Continue to hold Coumadin , INR is therapeutic. Coumadin should be restarted when  okay with surgery Heart rate is controlled at this time  Currently patient is in normal sinus rhythm      LOS: 2 days    Dispo:   [facility] Pager: [phone] 09/22/2012, 11:23 AM

## 2012-09-23 ENCOUNTER — Inpatient Hospital Stay (HOSPITAL_COMMUNITY): Payer: Medicare Other

## 2012-09-23 DIAGNOSIS — F29 Unspecified psychosis not due to a substance or known physiological condition: Secondary | ICD-10-CM

## 2012-09-23 LAB — CBC
MCV: 89.8 fL (ref 78.0–100.0)
Platelets: 133 10*3/uL — ABNORMAL LOW (ref 150–400)
RDW: 13.4 % (ref 11.5–15.5)
WBC: 7.1 10*3/uL (ref 4.0–10.5)

## 2012-09-23 LAB — PROTIME-INR: INR: 1.19 (ref 0.00–1.49)

## 2012-09-23 LAB — BASIC METABOLIC PANEL
CO2: 32 mEq/L (ref 19–32)
Calcium: 9.1 mg/dL (ref 8.4–10.5)
Creatinine, Ser: 0.67 mg/dL (ref 0.50–1.10)
GFR calc Af Amer: >90 mL/min (ref >90)

## 2012-09-23 LAB — GLUCOSE, CAPILLARY
Glucose-Capillary: 142 mg/dL — ABNORMAL HIGH (ref 70–99)
Glucose-Capillary: 240 mg/dL — ABNORMAL HIGH (ref 70–99)
Glucose-Capillary: 240 mg/dL — ABNORMAL HIGH (ref 70–99)

## 2012-09-23 MED ORDER — WARFARIN - PHYSICIAN DOSING INPATIENT: Freq: Every day | Status: DC

## 2012-09-23 MED ORDER — WARFARIN SODIUM 5 MG PO TABS
5.0000 mg | ORAL_TABLET | Freq: Every day | ORAL | Status: DC
  Administered 2012-09-23 – 2012-09-27 (×5): 5 mg via ORAL
  Filled 2012-09-23 (×6): qty 1

## 2012-09-23 MED ORDER — WARFARIN SODIUM 5 MG PO TABS
5.0000 mg | ORAL_TABLET | Freq: Every day | ORAL | Status: DC
  Filled 2012-09-23: qty 1

## 2012-09-23 MED ORDER — WARFARIN SODIUM 2.5 MG PO TABS
2.5000 mg | ORAL_TABLET | Freq: Every day | ORAL | Status: DC
  Filled 2012-09-23: qty 1

## 2012-09-23 NOTE — Progress Notes (Signed)
Mbr states that she was hurting, got mbr some pain medication. I was walking into pts room while opening the medication, pt was asleep and didn't really respond, so I did not give her anything. Pt stayed asleep for another 3 hrs. Wasted medication.

## 2012-09-23 NOTE — Progress Notes (Signed)
Patient ID: Elizabeth [name], female   DOB: 05/17/1941, 72 y.o.   MRN: [id number]    Subjective: Eating with assist now, denies pain, when asked location "they tell me I am in a hospital"  Objective: Vital signs in last 24 hours: Temp:  [97.7 F (36.5 C)-99 F (37.2 C)] 98 F (36.7 C) (02/02 0747) Pulse Rate:  [52-79] 62  (02/02 0800) Resp:  [11-26] 11  (02/02 0800) BP: (107-181)/(38-129) 129/47 mmHg (02/02 0800) SpO2:  [85 %-100 %] 95 % (02/02 0800) Weight:  [63.8 kg (140 lb 10.5 oz)] 63.8 kg (140 lb 10.5 oz) (02/02 0604)    Intake/Output from previous day: 02/01 0701 - 02/02 0700 In: 2325 [I.V.:1725] Out: 1690 [Urine:1690] Intake/Output this shift: Total I/O In: 75 [I.V.:75] Out: -   General appearance: cooperative Head: right facial ecchymoses evolving Neck: right neck ecchymoses evolving Resp: clear to auscultation bilaterally Cardio: S1, S2 normal GI: soft, NT, ND, +BS Neuro: F/C, MAE, orientation as above Cardio additional: Irreg Irreg rhythm  Lab Results: CBC   Basename 09/23/12 0450 09/21/12 0423  WBC 7.1 7.5  HGB 11.1* 11.2*  HCT 33.4* 34.3*  PLT 133* 135*   BMET  Basename 09/23/12 0450 09/21/12 0423  NA 139 135  K 3.8 4.4  CL 101 99  CO2 32 29  GLUCOSE 132* 276*  BUN 10 24*  CREATININE 0.67 0.76  CALCIUM 9.1 9.1   PT/INR  Basename 09/21/12 0423 09/20/12 1736  LABPROT 23.6* 22.2*  INR 2.21* 2.04*   ABG No results found for this basename: PHART:2,PCO2:2,PO2:2,HCO3:2 in the last 72 hours  Studies/Results: Ct Head Wo Contrast  09/21/2012  *RADIOLOGY REPORT*  Clinical Data: Altered mental status.  The patient fell with head trauma. On Coumadin.  Confusion.  CT HEAD WITHOUT CONTRAST  Technique:  Contiguous axial images were obtained from the base of the skull through the vertex without contrast.  Comparison: 09/20/2012  Findings: There is no acute intracranial hemorrhage, acute infarction, or mass lesion.  There are old left cerebellar and right  occipital infarcts with secondary encephalomalacia.  Diffuse mild atrophy with secondary ventricular dilatation.  Multiple right facial fractures are again noted.  There is no opacification of the middle ear cavities.  IMPRESSION: No acute intracranial abnormality.  Multiple facial fractures as previously noted.   Original Report Authenticated By: Francene Boyers, M.D.     Anti-infectives: Anti-infectives     Start     Dose/Rate Route Frequency Ordered Stop   09/20/12 2330   ceFAZolin (ANCEF) IVPB 1 g/50 mL premix  Status:  Discontinued        1 g 100 mL/hr over 30 Minutes Intravenous 3 times per day 09/20/12 2319 09/22/12 0801          Assessment/Plan: Fall R orbit/maxilla/zygoma Fxs - no surgery needed per Dr. Chales Salmon A fib - resume Coumadin today MS change - F/U CT head neg, appreciate IM F/U, Elavil stopped Hypoglycemia - resolved Syncope W/U - outpatient Holter per IM rec VTE - Coumadin Await SDU bed   LOS: 3 days    Violeta Gelinas, MD, MPH, FACS Pager: [phone]  09/23/2012

## 2012-09-23 NOTE — Progress Notes (Signed)
Subjective: Patient seen, has been confused. Called and discussed with patient's husband. 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 Patient was started on Elavil two months ago and since than her confusion has worsened.   Objective: Vital signs in last 24 hours: Temp:  [97.7 F (36.5 C)-99 F (37.2 C)] 97.7 F (36.5 C) (02/02 0000) Pulse Rate:  [52-79] 59  (02/02 0600) Resp:  [13-26] 17  (02/02 0600) BP: (107-175)/(38-129) 145/108 mmHg (02/02 0500) SpO2:  [85 %-100 %] 100 % (02/02 0600) Weight:  [63.8 kg (140 lb 10.5 oz)] 63.8 kg (140 lb 10.5 oz) (02/02 0604) Weight change:      Intake/Output from previous day: 02/01 0701 - 02/02 0700 In: 2325 [I.V.:1725] Out: 1690 [Urine:1690]     Physical Exam: Head: Normocephalic, atraumatic.  Eyes: No signs of jaundice, EOMI Nose: Mucous membranes dry.  Neck: supple,No deformities, masses, or tenderness noted. Lungs: Normal respiratory effort. B/L Clear to auscultation, no crackles or wheezes.  Heart: Regular RR. S1 and S2 normal  Abdomen: BS normoactive. Soft, Nondistended, non-tender.  Extremities: No pretibial edema, no erythema Neuro: alert oriented x 1, moving all extremities  Lab Results: Basic Metabolic Panel:  Basename 09/23/12 0450 09/21/12 0423  NA 139 135  K 3.8 4.4  CL 101 99  CO2 32 29  GLUCOSE 132* 276*  BUN 10 24*  CREATININE 0.67 0.76  CALCIUM 9.1 9.1  MG -- --  PHOS -- --   Liver Function Tests:  Basename 09/20/12 1736  AST 27  ALT 16  ALKPHOS 63  BILITOT 0.5  PROT 6.3  ALBUMIN 3.6   No results found for this basename: LIPASE:2,AMYLASE:2 in the last 72 hours No results found for this basename: AMMONIA:2 in the last 72 hours CBC:  Basename 09/23/12 0450 09/21/12 0423  WBC 7.1 7.5  NEUTROABS -- --  HGB 11.1* 11.2*  HCT 33.4* 34.3*  MCV 89.8 90.3  PLT 133* 135*   Cardiac Enzymes:  Basename 09/20/12 1723  CKTOTAL 114  CKMB 3.2  CKMBINDEX --  TROPONINI <0.30   :  Basename 09/23/12 0715 09/23/12 0347 09/22/12 2345 09/22/12 1910 09/22/12 1520 09/22/12 1132  GLUCAP 121* 127* 142* 160* 166* 217*   Hemoglobin A1C:  Basename 09/21/12 1044  HGBA1C 6.3*   Fasting Lipid Panel: No results found for this basename: CHOL,HDL,LDLCALC,TRIG,CHOLHDL,LDLDIRECT in the last 72 hours Thyroid Function Tests: No results found for this basename: TSH,T4TOTAL,FREET4,T3FREE,THYROIDAB in the last 72 hours Anemia Panel: No results found for this basename: VITAMINB12,FOLATE,FERRITIN,TIBC,IRON,RETICCTPCT in the last 72 hours Coagulation:  Basename 09/21/12 0423 09/20/12 1736  LABPROT 23.6* 22.2*  INR 2.21* 2.04*   Urine Drug Screen: Drugs of Abuse     Component Value Date/Time   LABOPIA NONE DETECTED 09/20/2012 2047   COCAINSCRNUR NONE DETECTED 09/20/2012 2047   LABBENZ NONE DETECTED 09/20/2012 2047   AMPHETMU NONE DETECTED 09/20/2012 2047   THCU NONE DETECTED 09/20/2012 2047   LABBARB NONE DETECTED 09/20/2012 2047    Alcohol Level:  Basename 09/20/12 1736  ETH <11   Urinalysis:  Basename 09/21/12 0115 09/20/12 2047  COLORURINE YELLOW YELLOW  LABSPEC 1.018 1.017  PHURINE 6.5 6.5  GLUCOSEU NEGATIVE NEGATIVE  HGBUR NEGATIVE NEGATIVE  BILIRUBINUR NEGATIVE NEGATIVE  KETONESUR 15* NEGATIVE  PROTEINUR NEGATIVE NEGATIVE  UROBILINOGEN 0.2 0.2  NITRITE NEGATIVE NEGATIVE  LEUKOCYTESUR NEGATIVE SMALL*   Misc. Labs:  Recent Results (from the past 240 hour(s))  MRSA PCR SCREENING     Status: Normal  Collection Time   09/20/12  9:57 PM      Component Value Range Status Comment   MRSA by PCR NEGATIVE  NEGATIVE Final   URINE CULTURE     Status: Normal   Collection Time   09/21/12  1:15 AM      Component Value Range Status Comment   Specimen Description URINE, CATHETERIZED   Final    Special Requests Normal   Final    Culture  Setup Time 09/21/2012 02:11   Final    Colony Count NO GROWTH   Final    Culture NO GROWTH   Final    Report Status 09/22/2012  FINAL   Final     Studies/Results: Ct Head Wo Contrast  09/21/2012  *RADIOLOGY REPORT*  Clinical Data: Altered mental status.  The patient fell with head trauma. On Coumadin.  Confusion.  CT HEAD WITHOUT CONTRAST  Technique:  Contiguous axial images were obtained from the base of the skull through the vertex without contrast.  Comparison: 09/20/2012  Findings: There is no acute intracranial hemorrhage, acute infarction, or mass lesion.  There are old left cerebellar and right occipital infarcts with secondary encephalomalacia.  Diffuse mild atrophy with secondary ventricular dilatation.  Multiple right facial fractures are again noted.  There is no opacification of the middle ear cavities.  IMPRESSION: No acute intracranial abnormality.  Multiple facial fractures as previously noted.   Original Report Authenticated By: Francene Boyers, M.D.     Medications: Scheduled Meds:    . fludrocortisone  0.1 mg Oral QODAY  . insulin aspart  1-5 Units Subcutaneous TID WC  . insulin glargine  10 Units Subcutaneous Daily  . levothyroxine  150 mcg Oral Custom  . levothyroxine  175 mcg Oral Custom  . mycophenolate  360 mg Oral BID  . neomycin-bacitracin-polymyxin   Topical Daily  . ondansetron (ZOFRAN) IV  4 mg Intravenous Once  . pantoprazole  40 mg Oral Daily   Or  . pantoprazole (PROTONIX) IV  40 mg Intravenous Daily  . predniSONE  5 mg Oral q morning - 10a  . tacrolimus  1 mg Oral QHS  . tacrolimus  2 mg Oral Daily   Continuous Infusions:    . sodium chloride 1,000 mL (09/22/12 0915)   PRN Meds:.acetaminophen, HYDROcodone-acetaminophen, morphine injection, morphine injection, ondansetron (ZOFRAN) IV, ondansetron  Assessment/Plan:  Active Problems:     Altered mental status/delirium  ? Cause Most likely multifactorial, secondary to opioids, underlying baseline dementia. will obtain MRI brain to r/o CVA Hold Elavil Minimise opioids use  Hypoglycemia  Resolved, most likely due to the  insulin patient has been receiving in the hospital when she was n.p.o. Patient is now eating and her blood sugar has improved Continue with the sliding scale insulin and Lantus 10 units daily.  Questionable syncope  Patient does have history of severe orthostatic hypotension and has been taking Florinef 0.1 mg every other day. The patient's loss of consciousness is most likely due to the hyperglycemia but patient would benefit from outpatient Holter monitoring. Patient has a cardiologist in [place] and who can set up a Holter monitor as outpatient. In the meantime we'll continue to observe the patient on telemetry in the hospital.   Hypothyroidism  Will obtain TSH, continue Synthroid   Orthostatic hypotension  Continue Florinef 0.1 mg every other day   Status post renal transplant  Continue prednisone, Prograf   Atrial fibrillation  Continue to hold Coumadin , INR is therapeutic. Coumadin should be restarted  when okay with surgery Heart rate is controlled at this time  Currently patient is in normal sinus rhythm      LOS: 3 days    Dispo:   [facility] Pager: [phone] 09/23/2012, 7:45 AM

## 2012-09-23 NOTE — Plan of Care (Signed)
Problem: Consults Goal: Diabetes Guidelines if Diabetic/Glucose > 140 If diabetic or lab glucose is > 140 mg/dl - Initiate Diabetes/Hyperglycemia Guidelines & Document Interventions  Outcome: Completed/Met Date Met:  09/23/12 Consult with Diabetes RN has been conducted, blood sugar currently controlled

## 2012-09-24 LAB — CBC
MCH: 29.5 pg (ref 26.0–34.0)
MCHC: 32.5 g/dL (ref 30.0–36.0)
Platelets: 135 10*3/uL — ABNORMAL LOW (ref 150–400)
RBC: 3.52 MIL/uL — ABNORMAL LOW (ref 3.87–5.11)

## 2012-09-24 LAB — BASIC METABOLIC PANEL
BUN: 15 mg/dL (ref 6–23)
CO2: 29 mEq/L (ref 19–32)
Calcium: 8.7 mg/dL (ref 8.4–10.5)
Chloride: 102 mEq/L (ref 96–112)
Creatinine, Ser: 0.71 mg/dL (ref 0.50–1.10)
GFR calc Af Amer: >90 mL/min (ref >90)
GFR calc non Af Amer: 84 mL/min — ABNORMAL LOW (ref >90)
Glucose, Bld: 43 mg/dL — CL (ref 70–99)
Potassium: 3.7 mEq/L (ref 3.5–5.1)
Sodium: 137 mEq/L (ref 135–145)

## 2012-09-24 LAB — GLUCOSE, CAPILLARY
Glucose-Capillary: 159 mg/dL — ABNORMAL HIGH (ref 70–99)
Glucose-Capillary: 111 mg/dL — ABNORMAL HIGH (ref 70–99)
Glucose-Capillary: 224 mg/dL — ABNORMAL HIGH (ref 70–99)

## 2012-09-24 LAB — PROTIME-INR
INR: 1.06 (ref 0.00–1.49)
Prothrombin Time: 13.7 seconds (ref 11.6–15.2)

## 2012-09-24 LAB — FOLATE RBC: RBC Folate: 1707 ng/mL — ABNORMAL HIGH (ref >=366)

## 2012-09-24 MED ORDER — INSULIN ASPART 100 UNIT/ML ~~LOC~~ SOLN
2.0000 [IU] | Freq: Once | SUBCUTANEOUS | Status: AC
  Administered 2012-09-24: 2 [IU] via SUBCUTANEOUS

## 2012-09-24 MED ORDER — DEXTROSE 50 % IV SOLN
INTRAVENOUS | Status: AC
  Filled 2012-09-24: qty 50

## 2012-09-24 MED ORDER — KCL IN DEXTROSE-NACL 20-5-0.9 MEQ/L-%-% IV SOLN
INTRAVENOUS | Status: DC
  Administered 2012-09-24: 1000 mL via INTRAVENOUS
  Administered 2012-09-25: 50 mL/h via INTRAVENOUS
  Filled 2012-09-24 (×2): qty 1000

## 2012-09-24 MED ORDER — GLUCERNA SHAKE PO LIQD
237.0000 mL | Freq: Three times a day (TID) | ORAL | Status: DC
  Administered 2012-09-24 – 2012-09-25 (×4): 237 mL via ORAL

## 2012-09-24 MED ORDER — TRAMADOL HCL 50 MG PO TABS
50.0000 mg | ORAL_TABLET | Freq: Four times a day (QID) | ORAL | Status: DC | PRN
  Administered 2012-09-24 – 2012-09-25 (×3): 50 mg via ORAL
  Filled 2012-09-24 (×3): qty 1

## 2012-09-24 MED ORDER — DEXTROSE 50 % IV SOLN
25.0000 g | Freq: Once | INTRAVENOUS | Status: AC
  Administered 2012-09-24: 25 g via INTRAVENOUS

## 2012-09-24 MED ORDER — INSULIN GLARGINE 100 UNIT/ML ~~LOC~~ SOLN
6.0000 [IU] | Freq: Every day | SUBCUTANEOUS | Status: DC
  Administered 2012-09-24 – 2012-09-28 (×5): 6 [IU] via SUBCUTANEOUS

## 2012-09-24 NOTE — Progress Notes (Signed)
Trauma Service Note  Subjective: Patient was apneic and snoring when first seen, but easily awakened, and oriented to person and place.  Did receive some Lartab and MS this AM.  Objective: Vital signs in last 24 hours: Temp:  [97.3 F (36.3 C)-98.2 F (36.8 C)] 97.5 F (36.4 C) (02/03 0719) Pulse Rate:  [55-73] 69  (02/03 0719) Resp:  [8-25] 12  (02/03 0719) BP: (112-173)/(37-67) 149/52 mmHg (02/03 0719) SpO2:  [79 %-100 %] 100 % (02/03 0719) FiO2 (%):  [24 %] 24 % (02/02 2000) Weight:  [63.4 kg (139 lb 12.4 oz)] 63.4 kg (139 lb 12.4 oz) (02/03 0500) Last BM Date: 09/23/12  Intake/Output from previous day: 02/02 0701 - 02/03 0700 In: 1520 [P.O.:245; I.V.:1275] Out: 600 [Urine:600] Intake/Output this shift: Total I/O In: -  Out: 150 [Urine:150]  General: No acute distress.  Hypoglycemia has been a a problem again.  Lungs: Clear  Abd: Benign  Extremities: No changes  Neuro: Seems intact  Lab Results: CBC   Basename 09/24/12 0520 09/23/12 0450  WBC 6.2 7.1  HGB 10.4* 11.1*  HCT 32.0* 33.4*  PLT 135* 133*   BMET  Basename 09/24/12 0520 09/23/12 0450  NA 137 139  K 3.7 3.8  CL 102 101  CO2 29 32  GLUCOSE 43* 132*  BUN 15 10  CREATININE 0.71 0.67  CALCIUM 8.7 9.1   PT/INR  Basename 09/24/12 0520 09/23/12 1150  LABPROT 13.7 14.9  INR 1.06 1.19   ABG No results found for this basename: PHART:2,PCO2:2,PO2:2,HCO3:2 in the last 72 hours  Studies/Results: Mr Brain Wo Contrast  09/23/2012  *RADIOLOGY REPORT*  Clinical Data: Question CVA.  Altered mental status.  MRI HEAD WITHOUT CONTRAST  Technique:  Multiplanar, multiecho pulse sequences of the brain and surrounding structures were obtained according to standard protocol without intravenous contrast.  Comparison: CT head without contrast 09/21/2012 and MRI brain without and with contrast 11/02/2010.  Findings: Remote encephalomalacia of the right occipital lobe and left cerebellum is stable.  Smaller lacunar  infarcts are present in the right cerebellar hemisphere.  The diffusion weighted images demonstrate no evidence for acute or subacute infarction.  No acute hemorrhage or mass lesion is present.  There are blood products associated with both areas of encephalomalacia.  Mild generalized atrophy is present.  Mild periventricular and scattered subcortical T2 changes are present in addition to the areas of encephalomalacia.  The general pattern is similar to the prior MRI.  Flow is present in the major intracranial arteries.  The globes and orbits are intact.  Chronic right maxillary sinus disease is present with a shrunken sinus.  Extensive mucosal thickening is present in the right greater than left ethmoid air cells and bilateral sphenoid sinuses.  Minimal mucosal disease is noted in the right frontal sinus.  Bilateral mastoid effusions are present, right greater than left. A right mastoid effusion is present.  No obstructing nasopharyngeal lesions are present.  IMPRESSION:  1.  No acute intracranial abnormality. 2.  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 3.  Atrophy and white matter disease as described.  This likely reflects the sequelae of chronic microvascular ischemia. 4.  Acute and chronic sinus disease as described. 5.  Right greater than left mastoid effusion.  No obstructing nasopharyngeal lesions are evident.   Original Report Authenticated By: Marin Roberts, M.D.     Anti-infectives: Anti-infectives     Start     Dose/Rate Route Frequency Ordered Stop  09/20/12 2330   ceFAZolin (ANCEF) IVPB 1 g/50 mL premix  Status:  Discontinued        1 g 100 mL/hr over 30 Minutes Intravenous 3 times per day 09/20/12 2319 09/22/12 0801          Assessment/Plan: s/p  Advance diet Probably give D5W solution. Work on placement. There is very little trauma related management at this time.  Work on hypoglycemia and getting her home.   LOS: 4 days   Marta Lamas. Gae Bon, MD, FACS [phone] Trauma Surgeon 09/24/2012

## 2012-09-24 NOTE — Progress Notes (Signed)
Rehab Admissions Coordinator Note:  Patient was screened by Clois Dupes for appropriateness for an Inpatient Acute Rehab Consult.  Contacted by OT to assess pt for inpt rehab . Husband able to provide care at home and pt was doing well prior to this fall. At this time, we are recommending Inpatient Rehab consult. I have contacted Trauma PA for order.  Clois Dupes 09/24/2012, 1:50 PM  I can be reached at [phone].

## 2012-09-24 NOTE — Progress Notes (Signed)
Lab called to notify me of Critical Glucose of 43. Dr Janee Morn notified.administering D50 1 Amp D50 administered. Pt easily arouseable prior to administering medication. Able to follow commands. Was not symptomatic with BS 43.  VS 66 12 100% sat b/p 136/47

## 2012-09-24 NOTE — Clinical Social Work Note (Signed)
Clinical Social Worker continuing to follow patient and family for emotional support and discharge needs.  Patient husband has relayed to MD that he does not want patient to return to SNF at discharge.  Per chart, OT recommending inpatient rehab - consult has been placed.  CSW will follow for support in the event that patient discharge plans change.  Patient SNF search has been initiated if needed.    [name], [place] [phone]

## 2012-09-24 NOTE — Progress Notes (Signed)
Subjective: Today patient is more alert and oriented. Blood glucose still dropped to 43 this morning. Pateint is on sliding scale insulin and lantus 10 units daily.  Objective: Vital signs in last 24 hours: Temp:  [97.3 F (36.3 C)-97.9 F (36.6 C)] 97.5 F (36.4 C) (02/03 0719) Pulse Rate:  [55-73] 69  (02/03 0719) Resp:  [8-25] 12  (02/03 0719) BP: (112-173)/(43-67) 149/52 mmHg (02/03 0719) SpO2:  [99 %-100 %] 100 % (02/03 0719) FiO2 (%):  [24 %] 24 % (02/02 2000) Weight:  [63.4 kg (139 lb 12.4 oz)] 63.4 kg (139 lb 12.4 oz) (02/03 0500) Weight change: -0.4 kg (-14.1 oz) Last BM Date: 09/23/12   Intake/Output from previous day: 02/02 0701 - 02/03 0700 In: 1520 [P.O.:245; I.V.:1275] Out: 600 [Urine:600] Total I/O In: -  Out: 150 [Urine:150]   Physical Exam: Head: Normocephalic, atraumatic.  Eyes: No signs of jaundice, EOMI Nose: Mucous membranes dry.  Neck: supple,No deformities, masses, or tenderness noted. Lungs: Normal respiratory effort. B/L Clear to auscultation, no crackles or wheezes.  Heart: Regular RR. S1 and S2 normal  Abdomen: BS normoactive. Soft, Nondistended, non-tender.  Extremities: No pretibial edema, no erythema Neuro: alert oriented x 1, moving all extremities  Lab Results: Basic Metabolic Panel:  Basename 09/24/12 0520 09/23/12 0450  NA 137 139  K 3.7 3.8  CL 102 101  CO2 29 32  GLUCOSE 43* 132*  BUN 15 10  CREATININE 0.71 0.67  CALCIUM 8.7 9.1  MG -- --  PHOS -- --   Liver Function Tests: No results found for this basename: AST:2,ALT:2,ALKPHOS:2,BILITOT:2,PROT:2,ALBUMIN:2 in the last 72 hours No results found for this basename: LIPASE:2,AMYLASE:2 in the last 72 hours No results found for this basename: AMMONIA:2 in the last 72 hours CBC:  Basename 09/24/12 0520 09/23/12 0450  WBC 6.2 7.1  NEUTROABS -- --  HGB 10.4* 11.1*  HCT 32.0* 33.4*  MCV 90.9 89.8  PLT 135* 133*   Cardiac Enzymes: No results found for this basename:  CKTOTAL:3,CKMB:3,CKMBINDEX:3,TROPONINI:3 in the last 72 hours:  Basename 09/24/12 0830 09/24/12 0717 09/23/12 2021 09/23/12 1731 09/23/12 1206 09/23/12 1116  GLUCAP 111* 159* 103* 120* 240* 240*   Hemoglobin A1C: No results found for this basename: HGBA1C in the last 72 hours Fasting Lipid Panel: No results found for this basename: CHOL,HDL,LDLCALC,TRIG,CHOLHDL,LDLDIRECT in the last 72 hours Thyroid Function Tests:  Basename 09/21/12 2050  TSH 2.872  T4TOTAL --  FREET4 --  T3FREE --  THYROIDAB --   Anemia Panel:  Basename 09/23/12 0850  VITAMINB12 952*  FOLATE --  FERRITIN --  TIBC --  IRON --  RETICCTPCT --   Coagulation:  Basename 09/24/12 0520 09/23/12 1150  LABPROT 13.7 14.9  INR 1.06 1.19   Urine Drug Screen: Drugs of Abuse     Component Value Date/Time   LABOPIA NONE DETECTED 09/20/2012 2047   COCAINSCRNUR NONE DETECTED 09/20/2012 2047   LABBENZ NONE DETECTED 09/20/2012 2047   AMPHETMU NONE DETECTED 09/20/2012 2047   THCU NONE DETECTED 09/20/2012 2047   LABBARB NONE DETECTED 09/20/2012 2047    Alcohol Level: No results found for this basename: ETH:2 in the last 72 hours Urinalysis: No results found for this basename: COLORURINE:2,APPERANCEUR:2,LABSPEC:2,PHURINE:2,GLUCOSEU:2,HGBUR:2,BILIRUBINUR:2,KETONESUR:2,PROTEINUR:2,UROBILINOGEN:2,NITRITE:2,LEUKOCYTESUR:2 in the last 72 hours Misc. Labs:  Recent Results (from the past 240 hour(s))  MRSA PCR SCREENING     Status: Normal   Collection Time   09/20/12  9:57 PM      Component Value Range Status Comment   MRSA by PCR  NEGATIVE  NEGATIVE Final   URINE CULTURE     Status: Normal   Collection Time   09/21/12  1:15 AM      Component Value Range Status Comment   Specimen Description URINE, CATHETERIZED   Final    Special Requests Normal   Final    Culture  Setup Time 09/21/2012 02:11   Final    Colony Count NO GROWTH   Final    Culture NO GROWTH   Final    Report Status 09/22/2012 FINAL   Final      Studies/Results: Mr Brain Wo Contrast  09/23/2012  *RADIOLOGY REPORT*  Clinical Data: Question CVA.  Altered mental status.  MRI HEAD WITHOUT CONTRAST  Technique:  Multiplanar, multiecho pulse sequences of the brain and surrounding structures were obtained according to standard protocol without intravenous contrast.  Comparison: CT head without contrast 09/21/2012 and MRI brain without and with contrast 11/02/2010.  Findings: Remote encephalomalacia of the right occipital lobe and left cerebellum is stable.  Smaller lacunar infarcts are present in the right cerebellar hemisphere.  The diffusion weighted images demonstrate no evidence for acute or subacute infarction.  No acute hemorrhage or mass lesion is present.  There are blood products associated with both areas of encephalomalacia.  Mild generalized atrophy is present.  Mild periventricular and scattered subcortical T2 changes are present in addition to the areas of encephalomalacia.  The general pattern is similar to the prior MRI.  Flow is present in the major intracranial arteries.  The globes and orbits are intact.  Chronic right maxillary sinus disease is present with a shrunken sinus.  Extensive mucosal thickening is present in the right greater than left ethmoid air cells and bilateral sphenoid sinuses.  Minimal mucosal disease is noted in the right frontal sinus.  Bilateral mastoid effusions are present, right greater than left. A right mastoid effusion is present.  No obstructing nasopharyngeal lesions are present.  IMPRESSION:  1.  No acute intracranial abnormality. 2.  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 3.  Atrophy and white matter disease as described.  This likely reflects the sequelae of chronic microvascular ischemia. 4.  Acute and chronic sinus disease as described. 5.  Right greater than left mastoid effusion.  No obstructing nasopharyngeal lesions are evident.    Original Report Authenticated By: Marin Roberts, M.D.     Medications: Scheduled Meds:    . dextrose      . feeding supplement  237 mL Oral TID BM  . fludrocortisone  0.1 mg Oral QODAY  . insulin aspart  1-5 Units Subcutaneous TID WC  . levothyroxine  150 mcg Oral Custom  . levothyroxine  175 mcg Oral Custom  . mycophenolate  360 mg Oral BID  . neomycin-bacitracin-polymyxin   Topical Daily  . ondansetron (ZOFRAN) IV  4 mg Intravenous Once  . pantoprazole  40 mg Oral Daily  . predniSONE  5 mg Oral q morning - 10a  . tacrolimus  1 mg Oral QHS  . tacrolimus  2 mg Oral Daily  . warfarin  5 mg Oral q1800  . Warfarin - Physician Dosing Inpatient   Does not apply q1800   Continuous Infusions:    . dextrose 5 % and 0.9 % NaCl with KCl 20 mEq/L 1,000 mL (09/24/12 0959)   PRN Meds:.acetaminophen, ondansetron (ZOFRAN) IV, ondansetron, traMADol  Assessment/Plan:  Active Problems:     Altered mental status/delirium  Improved Most likely multifactorial, secondary  to opioids, underlying baseline dementia. will obtain MRI brain to r/o CVA Hold Elavil Minimise opioids use  Hypoglycemia  Patient still having hypoglycemia in am.Called and discussed with Dr Talmage Nap, patient's endocrinologist. She recommends to cut down the dose of Lantus to 6 units daily and also continue with current sliding scale. If patient's blood sugar are stable on this current regimen she can be discharged home on this regimen and followup with Dr. Talmage Nap as outpatient  Questionable syncope  Patient does have history of severe orthostatic hypotension and has been taking Florinef 0.1 mg every other day. The patient's loss of consciousness is most likely due to the hyperglycemia but patient would benefit from outpatient Holter monitoring. Patient has a cardiologist in [place] and who can set up a Holter monitor as outpatient. In the meantime we'll continue to observe the patient on telemetry in the hospital.    Hypothyroidism  Will obtain TSH, continue Synthroid   Orthostatic hypotension  Continue Florinef 0.1 mg every other day   Status post renal transplant  Continue prednisone, Prograf   Atrial fibrillation  ContinueCoumadin , INR is therapeutic.  Heart rate is controlled at this time  Currently patient is in normal sinus rhythm      LOS: 4 days    Dispo:   [facility] Pager: [phone] 09/24/2012, 11:35 AM

## 2012-09-24 NOTE — Progress Notes (Signed)
Occupational Therapy Treatment Patient Details Name: Elizabeth [name] MRN: [id number] DOB: 08/15/1941 Today's Date: 09/24/2012 Time: 0865-7846 OT Time Calculation (min): 23 min  OT Assessment / Plan / Recommendation Comments on Treatment Session Completed second session with pt. Husband present. Discussed D/C recommendations regarding CIR. Husband interested iparticipating in CIR prior to d/C home. Discussed with Rehab Coordinator. Excellent rehab candidate.    Follow Up Recommendations  CIR    Barriers to Discharge  None    Equipment Recommendations  None recommended by OT    Recommendations for Other Services Rehab consult  Frequency Min 3X/week   Plan Discharge plan remains appropriate    Precautions / Restrictions Precautions Precautions: Fall Required Braces or Orthoses: Other Brace/Splint Restrictions Weight Bearing Restrictions: No   Pertinent Vitals/Pain 3. Pt reports improved    ADL  Eating/Feeding: Modified independent Where Assessed - Eating/Feeding: Chair Grooming: Minimal assistance Where Assessed - Grooming: Supported sitting Upper Body Bathing: Minimal assistance Where Assessed - Upper Body Bathing: Supported sitting Lower Body Bathing: Maximal assistance Where Assessed - Lower Body Bathing: Supported sit to stand Upper Body Dressing: Minimal assistance Where Assessed - Upper Body Dressing: Supported sitting Lower Body Dressing: +1 Total assistance Where Assessed - Lower Body Dressing: Supported sit to Pharmacist, hospital: Moderate assistance Toilet Transfer Method: Stand pivot Toilet Transfer Equipment: Bedside commode Toileting - Clothing Manipulation and Hygiene: Maximal assistance Where Assessed - Toileting Clothing Manipulation and Hygiene: Sit on 3-in-1 or toilet Equipment Used: Other (comment) Transfers/Ambulation Related to ADLs: Mod A std pivot to L side. Pt able to bear weight through BLE and assist with stepping during transfer ADL  Comments: Called back to room to assist with completing toileting. Pt with ability fto assist with hygien after toileitn by independently positioning self on [facility] for hygiene. Pt transffered to weaker side back to recliner. Able to stand step using therapist for support. Good paritcipation.    OT Diagnosis: Generalized weakness;Acute pain;Cognitive deficits  OT Problem List: Decreased strength;Decreased activity tolerance;Impaired balance (sitting and/or standing);Decreased cognition;Impaired sensation;Cardiopulmonary status limiting activity;Pain OT Treatment Interventions: Self-care/ADL training;Therapeutic exercise;Therapeutic activities;Cognitive remediation/compensation;Visual/perceptual remediation/compensation;Patient/family education;Balance training;Energy conservation;DME and/or AE instruction   OT Goals Acute Rehab OT Goals OT Goal Formulation: With patient/family Time For Goal Achievement: 10/08/12 Potential to Achieve Goals: Good ADL Goals Pt Will Perform Grooming: with set-up;Sitting, edge of bed;with supervision ADL Goal: Grooming - Progress: Progressing toward goals Pt Will Perform Upper Body Bathing: with supervision;with set-up;Sitting, edge of bed ADL Goal: Upper Body Bathing - Progress: Goal set today Pt Will Perform Lower Body Bathing: with mod assist;Supine, head of bed up;Supine, rolling right and/or left ADL Goal: Lower Body Bathing - Progress: Goal set today Pt Will Transfer to Toilet: with min assist;with DME;3-in-1;Ambulation ADL Goal: Toilet Transfer - Progress: Progressing toward goals Pt Will Perform Toileting - Clothing Manipulation: with min assist;Sitting on 3-in-1 or toilet;Standing ADL Goal: Toileting - Clothing Manipulation - Progress: Progressing toward goals Pt Will Perform Toileting - Hygiene: with min assist;Sit to stand from 3-in-1/toilet ADL Goal: Toileting - Hygiene - Progress: Progressing toward goals Additional ADL Goal #1: Complete bed mobility  with S in preparation for ADL retraining. ADL Goal: Additional Goal #1 - Progress: Goal set today  Visit Information  Last OT Received On: 09/24/12 Assistance Needed: +2    Subjective Data      Prior Functioning  Home Living Lives With: Spouse Available Help at Discharge: Family;Available 24 hours/day Type of Home: House Home Layout: One level Bathroom Shower/Tub: Heritage manager  Toilet: Standard Bathroom Accessibility: Yes How Accessible: Accessible via walker Home Adaptive Equipment: Grab bars in shower;Grab bars around toilet;Bedside commode/3-in-1;Shower chair with back;Walker - rolling;Wheelchair - manual Prior Function Level of Independence: Independent with assistive device(s);Needs assistance Needs Assistance: Bathing;Dressing;Toileting;Meal Prep;Light Housekeeping;Transfers Bath: Moderate Dressing: Moderate Toileting: Minimal Meal Prep: Total Light Housekeeping: Total Transfer Assistance: occasional assistance. Pt usually mod I Able to Take Stairs?: Yes (with assistance) Driving: No Communication Communication: No difficulties Dominant Hand: Right    Cognition  Cognition Overall Cognitive Status: History of cognitive impairments - at baseline Area of Impairment: Attention;Memory;Problem solving Arousal/Alertness: Awake/alert Orientation Level: Oriented X4 / Intact Behavior During Session: [facility] for tasks performed Current Attention Level: Selective Memory: Decreased recall of precautions Memory Deficits: history of memory deficits from previous CVA Following Commands: Follows multi-step commands consistently Safety/Judgement: Other (comment) (good awareness of safety this session) Problem Solving: Min A funcitonal basic. slow processing Cognition - Other Comments: Discussed with husband who states pt appears to be back to baseline    Mobility  Bed Mobility Bed Mobility: Rolling Right;Right Sidelying to Sit;Sitting - Scoot to Edge of Bed Rolling  Right: 5: Supervision;With rail Right Sidelying to Sit: 4: Min assist;HOB flat;With rails Sitting - Scoot to Edge of Bed: 3: Mod assist Details for Bed Mobility Assistance: Improved mobility as compared to previous PT session Transfers Transfers: Sit to Stand;Stand to Sit Sit to Stand: 3: Mod assist;With upper extremity assist;From chair/3-in-1 Stand to Sit: 3: Mod assist;With upper extremity assist;To chair/3-in-1 Details for Transfer Assistance: Ability to weight shift this transfer    Exercises      Balance Balance Balance Assessed: Yes Static Sitting Balance Static Sitting - Balance Support: Bilateral upper extremity supported;Feet supported Static Sitting - Level of Assistance: 5: Stand by assistance Static Sitting - Comment/# of Minutes: 10 Static Standing Balance Static Standing - Balance Support: Bilateral upper extremity supported Static Standing - Level of Assistance: 2: Max assist   End of Session OT - End of Session Equipment Utilized During Treatment: Left lower extremity prosthesis;Right ankle foot orthosis Activity Tolerance: Patient tolerated treatment well Patient left: in chair;with family/visitor present Nurse Communication: Mobility status;Other (comment)  GO     Gabriell Daigneault,HILLARY 09/24/2012, 2:39 PM [facility], OTR/L  [phone] 09/24/2012

## 2012-09-24 NOTE — Progress Notes (Signed)
INITIAL NUTRITION ASSESSMENT  DOCUMENTATION CODES Per approved criteria  -Not Applicable   INTERVENTION:  Glucerna Shake po TID, each supplement provides 220 kcal and 10 grams of protein.  NUTRITION DIAGNOSIS: Inadequate oral intake related to poor appetite as evidenced by 15-20% meal completion and weight loss PTA.   Goal: Intake to meet >90% of estimated nutrition needs.  Monitor:  PO intake, labs, weight trend.  Reason for Assessment: MST=3  72 y.o. female  Admitting Dx: S/P fall; Concussion with no acute intracranial hemorrhage; Right-sided facial fractures; Multiple abrasions  ASSESSMENT: Patient admitted to ICU S/P fall; now in step-down unit.  Hypoglycemia has been an issue this morning per discussion with RN who is currently trying to get patient to drink orange juice to maintain blood glucose.  Patient seemed very lethargic during RD visit; she didn't realize that she had lost weight.  Says she has been eating well.    Unable to diagnose malnutrition at this time without a good history of usual intake.  Suspect intake has been poor, but patient unable to verbalize this to RD.  Nutrition Focused Physical Exam:  Subcutaneous Fat:  Orbital Region: WNL Upper Arm Region: WNL Thoracic and Lumbar Region: NA  Muscle:  Temple Region: WNL Clavicle Bone Region: WNL Clavicle and Acromion Bone Region: WNL Scapular Bone Region: NA Dorsal Hand: WNL Patellar Region: WNL Anterior Thigh Region: WNL Posterior Calf Region: WNL   Height: Ht Readings from Last 1 Encounters:  09/04/12 5' 1.5" (1.562 m)    Weight: Wt Readings from Last 1 Encounters:  09/24/12 139 lb 12.4 oz (63.4 kg)    Ideal Body Weight: 46 kg  % Ideal Body Weight: 138%  Wt Readings from Last 10 Encounters:  09/24/12 139 lb 12.4 oz (63.4 kg)  09/04/12 146 lb (66.225 kg)  06/21/12 154 lb (69.854 kg)  06/13/12 154 lb (69.854 kg)  06/06/12 149 lb 4 oz (67.699 kg)  04/30/12 154 lb 8 oz (70.081 kg)   03/12/12 156 lb 6 oz (70.931 kg)  01/27/12 160 lb (72.576 kg)  01/27/12 160 lb (72.576 kg)  12/09/11 162 lb (73.483 kg)    Usual Body Weight: 154 lb (3 months ago)  % Usual Body Weight: 90%  BMI:  26 overweight  Estimated Nutritional Needs: Kcal: 1400-1600 Protein: 75-90 gm Fluid: 1.4-1.6 L  Skin: stage 2 pressure ulcer on right heel  Diet Order: CHO-modified medium  EDUCATION NEEDS: -Education not appropriate at this time   Intake/Output Summary (Last 24 hours) at 09/24/12 0949 Last data filed at 09/24/12 0720  Gross per 24 hour  Intake   1445 ml  Output    575 ml  Net    870 ml    Last BM: 2/2   Labs:   Lab 09/24/12 0520 09/23/12 0450 09/21/12 0423  NA 137 139 135  K 3.7 3.8 4.4  CL 102 101 99  CO2 29 32 29  BUN 15 10 24*  CREATININE 0.71 0.67 0.76  CALCIUM 8.7 9.1 9.1  MG -- -- --  PHOS -- -- --  GLUCOSE 43* 132* 276*    CBG (last 3)   Basename 09/24/12 0830 09/24/12 0717 09/23/12 2021  GLUCAP 111* 159* 103*    Scheduled Meds:   . dextrose      . fludrocortisone  0.1 mg Oral QODAY  . insulin aspart  1-5 Units Subcutaneous TID WC  . levothyroxine  150 mcg Oral Custom  . levothyroxine  175 mcg Oral Custom  .  mycophenolate  360 mg Oral BID  . neomycin-bacitracin-polymyxin   Topical Daily  . ondansetron (ZOFRAN) IV  4 mg Intravenous Once  . pantoprazole  40 mg Oral Daily  . predniSONE  5 mg Oral q morning - 10a  . tacrolimus  1 mg Oral QHS  . tacrolimus  2 mg Oral Daily  . warfarin  5 mg Oral q1800  . Warfarin - Physician Dosing Inpatient   Does not apply q1800    Continuous Infusions:   . dextrose 5 % and 0.9 % NaCl with KCl 20 mEq/L      Past Medical History  Diagnosis Date  . Coronary artery disease   . Chronic kidney disease     s/p cadaveric renal transplant in 2001  . Diabetes mellitus   . PVD (peripheral vascular disease)   . History of orthostatic hypotension   . Hyperlipidemia   . Hypothyroidism   . Anemia   .  Pancreatic cyst   . Osteoporosis     recurrent fractures  . Melanoma   . Carotid bruit     left  . Adenomatous polyp   . GERD (gastroesophageal reflux disease)   . Gastritis   . CVA (cerebral infarction)   . Bradycardia   . Renal artery stenosis   . Diabetic retinopathy(362.0)   . Renal osteodystrophy   . Hyperparathyroidism   . Leg ulcer     from poor fitting prosthetic  . H/O immunosuppressive therapy   . 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 Fx wrist   . Fx ankle   . Neuropathy   . Nephropathy   . Carotid artery stenosis     mild  . Stroke   . Tubular adenoma   . Colitis   . IBS (irritable bowel syndrome)   . Ischemic colitis   . 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 Esophageal candidiasis   . Neurogenic pain-esophagus 09/04/2012    Past Surgical History  Procedure Date  . Kidney transplant 08/15/2000    [facility] in [place]  . 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 Appendectomy   . Cataract extraction   . Cholecystectomy   . Abdominal hysterectomy   . Thyroid surgery   . Tonsillectomy   . Cesarean section     x 2  . Vitrectomy     right eye  . Coronary artery bypass graft   . Colonoscopy w/ biopsies and polypectomy 02/18/2009    adenomatous polyps, diverticulosis, internal hemorrhoids  . Eus 02/04/2010    w/FNA, pancreatic cyst  . Upper gastrointestinal endoscopy 12/01/2009  . Colonoscopy 01/30/2012    Procedure: COLONOSCOPY;  Surgeon: Rachael Fee, MD;  Location: [facility] ENDOSCOPY;  Service: Endoscopy;  Laterality: N/A;  . Esophagogastroduodenoscopy 05/02/2012    Procedure: ESOPHAGOGASTRODUODENOSCOPY (EGD);  Surgeon: Iva Boop, MD;  Location: [facility] ENDOSCOPY;  Service: Endoscopy;  Laterality: N/A;    Joaquin Courts, RD, LDN, CNSC Pager# [phone] After Hours Pager# [phone]

## 2012-09-24 NOTE — Progress Notes (Signed)
Speech Language Pathology Treatment Patient Details Name: Elizabeth [name] MRN: [id number] DOB: 06-22-1941 Today's Date: 09/24/2012 Time: 1355-1410 SLP Time Calculation (min): 15 min  Assessment / Plan / Recommendation Clinical Impression  Pt. seen for cognitive treatment with husband present following initial assessment 2/1.  Sleeping in chair but able to arouse with tactile/verbal cues.  Spouse reports pt. is more oriented today and cognition has improved since yesterday.  Pt. independently oriented to month.  She needed min verbal cueing to accurately state year ("1914"), day ("Sun").  She recalled "medical bldg" after receiving verbal cues after approximately 3 minutes and stated address with phonemic cue from spouse.  She correctly stated diagnoses "fall", however intellectual awareness of deficits required total verbal cues.  She complained earlier of nausea and vomited into emesis bucke following writing name without assist required.  Pt. with improving cognitive status.  SLP will continue to see for movement toward a modified independent level.    SLP Plan  Continue with current plan of care    Pertinent Vitals/Pain Nausea and vomitted, notified RN  SLP Goals  SLP Goals Potential to Achieve Goals: Good SLP Goal #1: Improve orientation skills to place, time and situation with moderate verbal and visual cues  SLP Goal #1 - Progress: Progressing toward goal SLP Goal #2: Complete target functional problem tasks with moderate to max verbal, visual and tactile cues.  SLP Goal #2 - Progress: Progressing toward goal SLP Goal #3: Improve intellectual awareness by stating current physical deficits with moderate verbal and tactile cues.  SLP Goal #3 - Progress: Progressing toward goal  General Temperature Spikes Noted: No Respiratory Status: Room air Behavior/Cognition: Cooperative;Requires cueing (drowsy) Patient Positioning: Upright in chair  Oral Cavity - Oral Hygiene Brush patient's  teeth BID with toothbrush (using toothpaste with fluoride): Yes   Treatment Treatment focused on: Cognition;Patient/family/caregiver education Family/Caregiver Educated: spouse Skilled Treatment: min verbal cueing   GO     Breck Coons Cash M.Ed [employer] [phone]  09/24/2012

## 2012-09-24 NOTE — Progress Notes (Signed)
UR completed 

## 2012-09-24 NOTE — Consult Note (Signed)
Physical Medicine and Rehabilitation Consult Reason for Consult:Fall with LOC/Facial fractures, eval rehab needs Referring Physician: Trauma   HPI: Elizabeth [name] is a 72 y.o. female with history coronary artery disease/CABG, chronic atrial fibrillation on Coumadin therapy, CVA, renal transplant 2001 and left BKA 1992. Admitted 09/20/2012 after a fall that was unwitnessed with noted altered mental status. MRI of the brain with no acute intracranial abnormalities noting areas of remote hemorrhagic encephalomalacia in the right occipital lobe and inferior left cerebellum. CT chest and abdomen negative. CT maxillofacial with fractures through the anterior and lateral walls of the right maxillary sinus and 2 fractures within the mid posterior right zygomatic arch and right orbital wall. 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 Bouts of orthostatic hypotension and patient remains on Florinef as prior to hospital admission. Patient remains on chronic Coumadin therapy for atrial fibrillation with no chest pain or shortness of breath. Physical therapy evaluation completed 09/22/2012 limited by cognition. M.D. is requested physical medicine rehabilitation consult to consider inpatient rehabilitation services  Patient remembers getting ready in the bathroom and the next thing she remembered was waking up in a hospital room. Chronic visual problems related to prior stroke in 2012 Uses a prosthetic on the left LE  Review of Systems  Cardiovascular: Positive for palpitations.  Musculoskeletal: Positive for myalgias and falls.  Neurological: Positive for dizziness.  All other systems reviewed and are negative.   Past Medical History  Diagnosis Date  . Coronary artery disease   . Chronic kidney disease     s/p cadaveric renal transplant in 2001  . Diabetes mellitus   . PVD (peripheral vascular disease)   .  History of orthostatic hypotension   . Hyperlipidemia   . Hypothyroidism   . Anemia   . Pancreatic cyst   . Osteoporosis     recurrent fractures  . Melanoma   . Carotid bruit     left  . Adenomatous polyp   . GERD (gastroesophageal reflux disease)   . Gastritis   . CVA (cerebral infarction)   . Bradycardia   . Renal artery stenosis   . Diabetic retinopathy(362.0)   . Renal osteodystrophy   . Hyperparathyroidism   . Leg ulcer     from poor fitting prosthetic  . H/O immunosuppressive therapy   . 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 Fx wrist   . Fx ankle   . Neuropathy   . Nephropathy   . Carotid artery stenosis     mild  . Stroke   . Tubular adenoma   . Colitis   . IBS (irritable bowel syndrome)   . Ischemic colitis   . 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 Esophageal candidiasis   . 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 Kidney transplant 08/15/2000    [facility] in [place]  . 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 Appendectomy   . Cataract extraction   . Cholecystectomy   . Abdominal hysterectomy   . Thyroid surgery   . Tonsillectomy   . Cesarean section     x 2  . Vitrectomy     right eye  . Coronary artery bypass graft   . Colonoscopy w/ biopsies and polypectomy 02/18/2009  adenomatous polyps, diverticulosis, internal hemorrhoids  . Eus 02/04/2010    w/FNA, pancreatic cyst  . Upper gastrointestinal endoscopy 12/01/2009  . Colonoscopy 01/30/2012    Procedure: COLONOSCOPY;  Surgeon: Rachael Fee, MD;  Location: [facility] ENDOSCOPY;  Service: Endoscopy;  Laterality: N/A;  . Esophagogastroduodenoscopy 05/02/2012    Procedure: ESOPHAGOGASTRODUODENOSCOPY (EGD);  Surgeon: Iva Boop, MD;  Location: [facility] ENDOSCOPY;  Service: Endoscopy;  Laterality: N/A;   Family History  Problem  Relation Age of Onset  . Stroke Mother 5  . Heart attack Father 94  . Colon cancer Neg Hx   . Diabetes Neg Hx    Social History:  reports that she has never smoked. She has never used smokeless tobacco. She reports that she does not drink alcohol or use illicit drugs. Allergies:  Allergies  Allergen Reactions  . Percocet (Oxycodone-Acetaminophen) Nausea And Vomiting   Medications Prior to Admission  Medication Sig Dispense Refill  . amitriptyline (ELAVIL) 25 MG tablet Take 1 tablet (25 mg total) by mouth at bedtime.  90 tablet  3  . atorvastatin (LIPITOR) 10 MG tablet Take 10 mg by mouth every other day.       . Calcium Carbonate-Vitamin D (CALCIUM 600-D) 600-400 MG-UNIT per tablet Take 1 tablet by mouth daily.       . Cholecalciferol (VITAMIN D3 SUPER STRENGTH) 2000 UNITS TABS Take 1 tablet by mouth daily at 12 noon.       . Cholecalciferol (VITAMIN D3) 400 UNITS CAPS Take 800 Units by mouth at bedtime.      . fludrocortisone (FLORINEF) 0.1 MG tablet Take 0.1 mg by mouth every other day.       . hyoscyamine (LEVSIN SL) 0.125 MG SL tablet Place 0.125 mg under the tongue daily as needed. For IBS      . insulin glargine (LANTUS) 100 UNIT/ML injection Inject 10 Units into the skin every morning.       . insulin lispro (HUMALOG) 100 UNIT/ML injection Inject 2-9 Units into the skin 4 (four) times daily -  before meals and at bedtime. Per sliding scale.      . levothyroxine (SYNTHROID, LEVOTHROID) 150 MCG tablet Take 150 mcg by mouth See admin instructions. Take 1 tablet daily Monday through Friday      . levothyroxine (SYNTHROID, LEVOTHROID) 175 MCG tablet Take 175 mcg by mouth See admin instructions. 1 tablet daily on Saturday and Sunday      . Magnesium 250 MG TABS Take 250 mg by mouth daily.      . Multiple Vitamin (MULTIVITAMIN WITH MINERALS) TABS Take 1 tablet by mouth daily.      . mycophenolate (MYFORTIC) 360 MG TBEC Take 360 mg by mouth 2 (two) times daily.       Marland Kitchen omeprazole  (PRILOSEC) 20 MG capsule Take 20 mg by mouth every morning.       . predniSONE (DELTASONE) 5 MG tablet Take 5 mg by mouth every morning.       . tacrolimus (PROGRAF) 0.5 MG capsule Take 1-2 mg by mouth 2 (two) times daily. Take 2mg  in the am & 1mg  at night      . warfarin (COUMADIN) 2.5 MG tablet Take 5 mg by mouth every evening. Take as directed by anticoagulation clinic        Home: Home Living Lives With: Spouse Available Help at Discharge: Family;Available 24 hours/day Type of Home: House Home Access:  (Husband able to get pt in PTA) Home Layout: One level Bathroom  Toilet: Standard Home Adaptive Equipment: Walker - rolling Additional Comments: Chart reports that pt had all equipment for husband to care for her PTA.  He plans to care for her x 24 hours/day.  He is not present at this time.He only would leave her 1 hour at a time per chart.  She recently had been more demented and hallucitnating.    Functional History: Prior Function Able to Take Stairs?: Yes Driving: No Vocation: Retired Functional Status:  Mobility: Bed Mobility Bed Mobility: Rolling Right;Right Sidelying to Sit;Sitting - Scoot to [place] of Bed Rolling Right: 1: +2 Total assist;With rail Rolling Right: Patient Percentage: 30% Right Sidelying to Sit: 1: +2 Total assist;With rails;HOB elevated Right Sidelying to Sit: Patient Percentage: 40% Sitting - Scoot to Edge of Bed: 1: +2 Total assist Sitting - Scoot to Edge of Bed: Patient Percentage: 50% Transfers Transfers: Sit to Stand;Stand to [employer] Transfers Sit to Stand: 1: +2 Total assist;With upper extremity assist;From bed Sit to Stand: Patient Percentage: 50% Stand to Sit: 1: +2 Total assist;With upper extremity assist;With armrests;To chair/3-in-1 Stand to Sit: Patient Percentage: 50% Stand Pivot Transfers: 1: +2 Total assist Stand Pivot Transfers: Patient Percentage: 60% Ambulation/Gait Ambulation/Gait Assistance: Not tested (comment) Stairs:  No Wheelchair Mobility Wheelchair Mobility: No  ADL:    Cognition: Cognition Overall Cognitive Status: Impaired Arousal/Alertness: Lethargic Orientation Level: Oriented to person Attention: Focused Focused Attention: Impaired Focused Attention Impairment: Verbal basic;Functional basic Memory: Impaired Memory Impairment: Storage deficit;Retrieval deficit;Decreased recall of new information;Decreased long term memory Decreased Long Term Memory: Verbal basic;Functional basic Awareness: Impaired Awareness Impairment: Intellectual impairment;Emergent impairment;Anticipatory impairment Problem Solving: Impaired Problem Solving Impairment: Verbal basic;Functional basic Executive Function: Reasoning;Initiating Reasoning: Impaired Reasoning Impairment: Verbal basic;Functional basic Initiating: Impaired Initiating Impairment: Verbal basic;Functional basic Behaviors: Confabulation Safety/Judgment: Impaired Cognition Overall Cognitive Status: History of cognitive impairments - at baseline Area of Impairment: Attention;Memory;Following commands;Safety/judgement;Awareness of deficits;Problem solving Arousal/Alertness: Lethargic Orientation Level: Disoriented to;Place;Time;Situation Behavior During Session: Anxious Current Attention Level: Focused Attention - Other Comments: seconds only  Memory Deficits: poor short term and long term memory deficits Following Commands: Follows one step commands inconsistently;Follows one step commands with increased time Safety/Judgement: Decreased safety judgement for tasks assessed;Decreased awareness of need for assistance Safety/Judgement - Other Comments: poor safety and poor judgment.   Awareness of Deficits: Pt with poor quality of movement Cognition - Other Comments: decreased  Blood pressure 167/65, pulse 65, temperature 97.4 F (36.3 C), temperature source Oral, resp. rate 13, weight 63.4 kg (139 lb 12.4 oz), SpO2 100.00%. Physical Exam   Vitals reviewed. HENT:       Multiple bruising and bruising to facial area  Eyes: EOM are normal.  Neck: Normal range of motion. Neck supple. No thyromegaly present.  Cardiovascular:       Cardiac rate control  Pulmonary/Chest: Effort normal and breath sounds normal. No respiratory distress.  Abdominal: Soft. Bowel sounds are normal. She exhibits no distension.  Neurological: She is alert.       Patient appropriate for age and place. She needed some cues for day of the week. She was a fair medical historian. Follow simple commands  Skin:       Skin is quite frail with multiple bruising to her upper extremities. Left BKA is somewhat red  Remembers 2[place]3 cities in [place] after a 2 minute delay Motor strength is 4/5 in bilateral biceps, triceps, wrist flexors and extension and grip as well as hip flexors knee extensors ankle dorsiflexors and plantar flexors Sensory exam  is normal in the upper extremities Has difficulty with left finger nose to finger Left field cut on confrontation testing  Results for orders placed during the hospital encounter of 09/20/12 (from the past 24 hour(s))  GLUCOSE, CAPILLARY     Status: Abnormal   Collection Time   09/23/12  5:31 PM      Component Value Range   Glucose-Capillary 120 (*) 70 - 99 mg/dL  GLUCOSE, CAPILLARY     Status: Abnormal   Collection Time   09/23/12  8:21 PM      Component Value Range   Glucose-Capillary 103 (*) 70 - 99 mg/dL  CBC     Status: Abnormal   Collection Time   09/24/12  5:20 AM      Component Value Range   WBC 6.2  4.0 - 10.5 K/uL   RBC 3.52 (*) 3.87 - 5.11 MIL/uL   Hemoglobin 10.4 (*) 12.0 - 15.0 g/dL   HCT 16.1 (*) 09.6 - 04.5 %   MCV 90.9  78.0 - 100.0 fL   MCH 29.5  26.0 - 34.0 pg   MCHC 32.5  30.0 - 36.0 g/dL   RDW 40.9  81.1 - 91.4 %   Platelets 135 (*) 150 - 400 K/uL  BASIC METABOLIC PANEL     Status: Abnormal   Collection Time   09/24/12  5:20 AM      Component Value Range   Sodium 137  135 - 145 mEq/L    Potassium 3.7  3.5 - 5.1 mEq/L   Chloride 102  96 - 112 mEq/L   CO2 29  19 - 32 mEq/L   Glucose, Bld 43 (*) 70 - 99 mg/dL   BUN 15  6 - 23 mg/dL   Creatinine, Ser 7.82  0.50 - 1.10 mg/dL   Calcium 8.7  8.4 - 95.6 mg/dL   GFR calc non Af Amer 84 (*) >90 mL/min   GFR calc Af Amer >90  >90 mL/min  PROTIME-INR     Status: Normal   Collection Time   09/24/12  5:20 AM      Component Value Range   Prothrombin Time 13.7  11.6 - 15.2 seconds   INR 1.06  0.00 - 1.49  GLUCOSE, CAPILLARY     Status: Abnormal   Collection Time   09/24/12  7:17 AM      Component Value Range   Glucose-Capillary 159 (*) 70 - 99 mg/dL  GLUCOSE, CAPILLARY     Status: Abnormal   Collection Time   09/24/12  8:30 AM      Component Value Range   Glucose-Capillary 111 (*) 70 - 99 mg/dL   Comment 1 Notify RN    GLUCOSE, CAPILLARY     Status: Abnormal   Collection Time   09/24/12 12:16 PM      Component Value Range   Glucose-Capillary 154 (*) 70 - 99 mg/dL   Comment 1 Notify RN     Mr Brain Wo Contrast  09/23/2012  *RADIOLOGY REPORT*  Clinical Data: Question CVA.  Altered mental status.  MRI HEAD WITHOUT CONTRAST  Technique:  Multiplanar, multiecho pulse sequences of the brain and surrounding structures were obtained according to standard protocol without intravenous contrast.  Comparison: CT head without contrast 09/21/2012 and MRI brain without and with contrast 11/02/2010.  Findings: Remote encephalomalacia of the right occipital lobe and left cerebellum is stable.  Smaller lacunar infarcts are present in the right cerebellar hemisphere.  The diffusion weighted images demonstrate no  evidence for acute or subacute infarction.  No acute hemorrhage or mass lesion is present.  There are blood products associated with both areas of encephalomalacia.  Mild generalized atrophy is present.  Mild periventricular and scattered subcortical T2 changes are present in addition to the areas of encephalomalacia.  The general pattern is similar  to the prior MRI.  Flow is present in the major intracranial arteries.  The globes and orbits are intact.  Chronic right maxillary sinus disease is present with a shrunken sinus.  Extensive mucosal thickening is present in the right greater than left ethmoid air cells and bilateral sphenoid sinuses.  Minimal mucosal disease is noted in the right frontal sinus.  Bilateral mastoid effusions are present, right greater than left. A right mastoid effusion is present.  No obstructing nasopharyngeal lesions are present.  IMPRESSION:  1.  No acute intracranial abnormality. 2.  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 3.  Atrophy and white matter disease as described.  This likely reflects the sequelae of chronic microvascular ischemia. 4.  Acute and chronic sinus disease as described. 5.  Right greater than left mastoid effusion.  No obstructing nasopharyngeal lesions are evident.   Original Report Authenticated By: Marin Roberts, M.D.     Assessment/Plan: 1. 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 Yes 2. Co-Morbidities requiring supervision/potential complications: Diabetes type 2, atrial fibrillation, History of orthostatic hypotension 3. Due to bladder management, bowel management, safety, skin/wound care, disease management, medication administration, pain management and patient education, does the patient require 24 hr/day rehab nursing? Yes 4. Does the patient require coordinated care of a physician, rehab nurse, PT (1-2 hrs/day, 5 days/week), OT (1-2 hrs/day, 5 days/week) and SLP (0.5-1 hrs/day, 5 days/week) to address physical and functional deficits in the context of the above  medical diagnosis(es)? Yes Addressing deficits in the following areas: balance, endurance, locomotion, strength, transferring, bowel/bladder control, bathing, dressing, feeding, grooming, toileting, cognition, speech and language 5. Can the patient actively participate in an intensive therapy program of at least 3 hrs of therapy per day at least 5 days per week? Yes 6. The potential for patient to make measurable gains while on inpatient rehab is good 7. Anticipated functional outcomes upon discharge from inpatient rehab are Supervision to min assist mobility with PT, Supervision min assist ADLs with OT, Improve memory and attention for household tasks and to compensate for balance deficit and field cut with SLP. 8. Estimated rehab length of stay to reach the above functional goals is: 2 weeks 9. Does the patient have adequate social supports to accommodate these discharge functional goals? Potentially 10. Anticipated D/C setting: Home 11. Anticipated post D/C treatments: HH therapy 12. Overall Rehab/Functional Prognosis: good  RECOMMENDATIONS: This patient's condition is appropriate for continued rehabilitative care in the following setting: CIR Patient has agreed to participate in recommended program. Yes Note that insurance prior authorization may be required for reimbursement for recommended care.  Comment:    09/24/2012

## 2012-09-24 NOTE — Progress Notes (Signed)
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 s/p L BKA. ESRD. s/p CABG. PVD. DM. Pt suffered fall at home in bathroom due to low blood sugars - 47. Pt with R facial fractures. ? syncope. PTA, per conversation with husband, pt was mod I with mobility @ RW level and required min - mod A with basic ADL. Pt used RW for ambulation @ home. Husband provides 24/7 S. Pt with baseline cognitive deficits, especially STM deficits. Feel pt will benefit from CIR stay to return pt to PLOF to safely D/C home with supportive husband who can provide 24/7 care. Pt has all DME needed. Pt will benefit from skilled OT services to max independence with ADL andmobility fto facilitate D/C to next venue of care.    OT Assessment  Patient needs continued OT Services    Follow Up Recommendations  CIR    Barriers to Discharge None    Equipment Recommendations  None recommended by OT    Recommendations for Other Services Rehab consult  Frequency  Min 3X/week    Precautions / Restrictions Precautions Precautions: Fall Required Braces or Orthoses: Other Brace/Splint (R KAFO L prosthesis) Restrictions Weight Bearing Restrictions: No   Pertinent Vitals/Pain 5. Pain "all over". nsg gave pain meds. End of seesion:O2 Sats 100 RA. 146/68 RR21    ADL  Eating/Feeding: Modified independent Where Assessed - Eating/Feeding: Chair Grooming: Minimal assistance Where Assessed - Grooming: Supported sitting Upper Body Bathing: Minimal assistance Where Assessed - Upper Body Bathing: Supported sitting Lower Body Bathing: Maximal assistance Where Assessed - Lower Body Bathing: Supported sit to stand Upper Body Dressing: Minimal assistance Where Assessed -  Upper Body Dressing: Supported sitting Lower Body Dressing: +1 Total assistance Where Assessed - Lower Body Dressing: Supported sit to Pharmacist, hospital: Moderate assistance Toilet Transfer Method: Stand pivot Toilet Transfer Equipment: Bedside commode Toileting - Clothing Manipulation and Hygiene: +1 Total assistance Where Assessed - Toileting Clothing Manipulation and Hygiene: Sit to stand from 3-in-1 or toilet Equipment Used: Other (comment) (B leg orthotics) Transfers/Ambulation Related to ADLs: Mod a std pivot to strong R side ADL Comments: decreae in functional status as compared to baseline    OT Diagnosis: Generalized weakness;Acute pain;Cognitive deficits  OT Problem List: Decreased strength;Decreased activity tolerance;Impaired balance (sitting and/or standing);Decreased cognition;Impaired sensation;Cardiopulmonary status limiting activity;Pain OT Treatment Interventions: Self-care/ADL training;Therapeutic exercise;Therapeutic activities;Cognitive remediation/compensation;Visual/perceptual remediation/compensation;Patient/family education;Balance training;Energy conservation;DME and/or AE instruction   OT Goals Acute Rehab OT Goals OT Goal Formulation: With patient/family Time For Goal Achievement: 10/08/12 Potential to Achieve Goals: Good ADL Goals Pt Will Perform Grooming: with set-up;Sitting, edge of bed;with supervision ADL Goal: Grooming - Progress: Goal set today Pt Will Perform Upper Body Bathing: with supervision;with set-up;Sitting, edge of bed ADL Goal: Upper Body Bathing - Progress: Goal set today Pt Will Perform Lower Body Bathing: with mod assist;Supine, head of bed up;Supine, rolling right and/or left ADL Goal: Lower Body Bathing - Progress: Goal set today Pt Will Transfer to Toilet: with min assist;with DME;3-in-1;Ambulation ADL Goal: Toilet Transfer - Progress: Goal set today Pt Will Perform Toileting - Clothing Manipulation: with min assist;Sitting on  3-in-1 or toilet;Standing ADL Goal: Toileting - Clothing Manipulation - Progress: Goal set today Pt Will Perform Toileting - Hygiene: with min assist;Sit to stand from 3-in-1/toilet ADL Goal:  Toileting - Hygiene - Progress: Goal set today Additional ADL Goal #1: Complete bed mobility with S in preparation for ADL retraining. ADL Goal: Additional Goal #1 - Progress: Goal set today  Visit Information  Last OT Received On: 09/24/12 Assistance Needed: +2 (for ambulation)    Subjective Data      Prior Functioning     Home Living Lives With: Spouse Available Help at Discharge: Family;Available 24 hours/day Type of Home: House Home Layout: One level Bathroom Shower/Tub: Health visitor: Standard Bathroom Accessibility: Yes How Accessible: Accessible via walker Home Adaptive Equipment: Grab bars in shower;Grab bars around toilet;Bedside commode/3-in-1;Shower chair with back;Walker - rolling;Wheelchair - manual Prior Function Level of Independence: Independent with assistive device(s);Needs assistance Needs Assistance: Bathing;Dressing;Toileting;Meal Prep;Light Housekeeping;Transfers Bath: Moderate Dressing: Moderate Toileting: Minimal Meal Prep: Total Light Housekeeping: Total Transfer Assistance: occasional assistance. Pt usually mod I Able to Take Stairs?: Yes (with assistance) Driving: No Communication Communication: No difficulties Dominant Hand: Right         Vision/Perception Vision - History Baseline Vision: Other (comment) Visual History: Other (comment) ("poor vision") Patient Visual Report: No change from baseline Vision - Assessment Eye Alignment: Within Functional Limits Perception Perception: Within Functional Limits Praxis Praxis: Intact   Cognition  Cognition Overall Cognitive Status: History of cognitive impairments - at baseline Area of Impairment: Attention;Memory;Problem solving Arousal/Alertness: Awake/alert Orientation Level:  Oriented X4 / Intact Behavior During Session: [facility] for tasks performed Current Attention Level: Selective Memory: Decreased recall of precautions Memory Deficits: history of memory deficits from previous CVA Following Commands: Follows multi-step commands consistently Safety/Judgement: Other (comment) (good awareness of safety this session) Problem Solving: Min A funcitonal basic. slow processing Cognition - Other Comments: Discussed with husband who states pt appears to be back to baseline    Extremity/Trunk Assessment Right Upper Extremity Assessment RUE ROM/Strength/Tone: [facility] for tasks assessed RUE Sensation: WFL - Proprioception RUE Coordination: Deficits (min fine motor coordination deficits ? numbness in hands) Left Upper Extremity Assessment LUE ROM/Strength/Tone: WFL for tasks assessed LUE Sensation: WFL - Proprioception;History of peripheral neuropathy LUE Coordination:  (decreased fine motor) Right Lower Extremity Assessment RLE ROM/Strength/Tone: Deficits;Unable to fully assess RLE ROM/Strength/Tone Deficits: general weakness from immobility. premorbid limitations. KAFO RLE Sensation: Deficits RLE Sensation Deficits: decreased to LTfoot to knee Left Lower Extremity Assessment LLE ROM/Strength/Tone: Deficits LLE ROM/Strength/Tone Deficits: BKA, hip and knee grossly 2+/5  Trunk Assessment Trunk Assessment: Normal     Mobility Bed Mobility Bed Mobility: Rolling Right;Right Sidelying to Sit;Sitting - Scoot to Edge of Bed Rolling Right: 5: Supervision;With rail Right Sidelying to Sit: 4: Min assist;HOB flat;With rails Sitting - Scoot to Edge of Bed: 3: Mod assist Details for Bed Mobility Assistance: Improved mobility as compared to previous PT session Transfers Transfers: Sit to Stand;Stand to Sit Sit to Stand: 3: Mod assist;With upper extremity assist;From bed Stand to Sit: 3: Mod assist;With upper extremity assist;To chair/3-in-1 Details for Transfer Assistance:  Improved performance with increased time and clear simple explanation given vision deficit     Exercise     Balance Balance Balance Assessed: Yes Static Sitting Balance Static Sitting - Balance Support: Bilateral upper extremity supported;Feet supported Static Sitting - Level of Assistance: 5: Stand by assistance Static Sitting - Comment/# of Minutes: 10 Static Standing Balance Static Standing - Balance Support: Bilateral upper extremity supported Static Standing - Level of Assistance: 2: Max assist   End of Session OT - End of Session Equipment Utilized During Treatment: Left lower extremity prosthesis;Right ankle foot orthosis  Activity Tolerance: Patient tolerated treatment well Patient left: Other (comment) [facility]) Nurse Communication: Mobility status;Other (comment)  GO     Tavone Caesar,HILLARY 09/24/2012, 2:27 PM [facility], OTR/L  [phone] 09/24/2012

## 2012-09-25 DIAGNOSIS — IMO0002 Reserved for concepts with insufficient information to code with codable children: Secondary | ICD-10-CM

## 2012-09-25 DIAGNOSIS — Z7901 Long term (current) use of anticoagulants: Secondary | ICD-10-CM

## 2012-09-25 DIAGNOSIS — W19XXXA Unspecified fall, initial encounter: Secondary | ICD-10-CM

## 2012-09-25 DIAGNOSIS — S069X9A Unspecified intracranial injury with loss of consciousness of unspecified duration, initial encounter: Secondary | ICD-10-CM

## 2012-09-25 LAB — GLUCOSE, CAPILLARY
Glucose-Capillary: 208 mg/dL — ABNORMAL HIGH (ref 70–99)
Glucose-Capillary: 188 mg/dL — ABNORMAL HIGH (ref 70–99)
Glucose-Capillary: 219 mg/dL — ABNORMAL HIGH (ref 70–99)

## 2012-09-25 MED ORDER — MIRTAZAPINE 15 MG PO TBDP
7.5000 mg | ORAL_TABLET | Freq: Every day | ORAL | Status: DC
  Filled 2012-09-25: qty 0.5

## 2012-09-25 MED ORDER — SODIUM CHLORIDE 0.9 % IV SOLN
INTRAVENOUS | Status: DC
  Administered 2012-09-25: 1000 mL via INTRAVENOUS

## 2012-09-25 MED ORDER — INSULIN ASPART 100 UNIT/ML ~~LOC~~ SOLN
1.0000 [IU] | Freq: Three times a day (TID) | SUBCUTANEOUS | Status: DC
Start: 2012-09-25 — End: 2012-09-28
  Administered 2012-09-25: 1 [IU] via SUBCUTANEOUS
  Administered 2012-09-25 – 2012-09-26 (×3): 2 [IU] via SUBCUTANEOUS
  Administered 2012-09-27: 3 [IU] via SUBCUTANEOUS
  Administered 2012-09-27 – 2012-09-28 (×3): 1 [IU] via SUBCUTANEOUS

## 2012-09-25 MED ORDER — HALOPERIDOL LACTATE 5 MG/ML IJ SOLN
5.0000 mg | Freq: Four times a day (QID) | INTRAMUSCULAR | Status: DC | PRN
  Administered 2012-09-26 – 2012-09-28 (×3): 5 mg via INTRAVENOUS
  Filled 2012-09-25 (×3): qty 1

## 2012-09-25 MED ORDER — POLYETHYLENE GLYCOL 3350 17 G PO PACK
17.0000 g | PACK | Freq: Every day | ORAL | Status: DC
  Administered 2012-09-25 – 2012-09-28 (×4): 17 g via ORAL
  Filled 2012-09-25 (×4): qty 1

## 2012-09-25 MED ORDER — HYDRALAZINE HCL 20 MG/ML IJ SOLN
10.0000 mg | Freq: Four times a day (QID) | INTRAMUSCULAR | Status: DC | PRN
  Administered 2012-09-25 – 2012-09-27 (×2): 10 mg via INTRAVENOUS
  Filled 2012-09-25 (×2): qty 0.5

## 2012-09-25 MED ORDER — DOCUSATE SODIUM 100 MG PO CAPS
100.0000 mg | ORAL_CAPSULE | Freq: Two times a day (BID) | ORAL | Status: DC
  Administered 2012-09-25 – 2012-09-28 (×7): 100 mg via ORAL
  Filled 2012-09-25 (×8): qty 1

## 2012-09-25 MED ORDER — MIRTAZAPINE 7.5 MG PO TABS
7.5000 mg | ORAL_TABLET | Freq: Every day | ORAL | Status: DC
  Administered 2012-09-25: 7.5 mg via ORAL
  Filled 2012-09-25 (×2): qty 1

## 2012-09-25 NOTE — Progress Notes (Signed)
Physical Therapy Treatment Patient Details Name: Elizabeth [name] MRN: [id number] DOB: 01/28/1941 Today's Date: 09/25/2012 Time: 7829-5621 PT Time Calculation (min): 28 min  PT Assessment / Plan / Recommendation Comments on Treatment Session  Pt admitted s/p fall due to hypoglycemia with facial fx and old L CVA as well as old L BKA. Pt with assist to adjust and fully don LLE prosthesis with sequential cueing for all transfers and mobility. Pt albe to ambulate today and perform transfers with less physical assist but continue to agree that CIR prior to home would be beneficial for pt and spouse to increase function and decrease burden of care. Wil continue to follow.     Follow Up Recommendations  CIR;Supervision/Assistance - 24 hour     Does the patient have the potential to tolerate intense rehabilitation     Barriers to Discharge        Equipment Recommendations       Recommendations for Other Services    Frequency Min 3X/week   Plan Discharge plan needs to be updated;Frequency remains appropriate    Precautions / Restrictions Precautions Precautions: Fall Required Braces or Orthoses: Other Brace/Splint Other Brace/Splint: R KAFO, L BKA prosthesis   Pertinent Vitals/Pain 2/10 acute abdominal pain Vitals stable throughout    Mobility  Bed Mobility Bed Mobility: Not assessed Details for Bed Mobility Assistance: Pt in recliner before and after session Transfers Sit to Stand: With armrests;From chair/3-in-1;3: Mod assist Stand to Sit: With armrests;4: Min assist Details for Transfer Assistance: cueing for hand placement, scooting forward, foot position and safety with assist to acheive anterior translation. With sitting max cueing to reach for surface and control descent prior to return to chair Ambulation/Gait Ambulation/Gait Assistance: 1: +2 Total assist (+1 for chair and safety) Ambulation/Gait: Patient Percentage: 80% Ambulation Distance (Feet): 20 Feet (9' and then  20' after seated rest) Assistive device: Rolling walker Gait Pattern: Step-to pattern;Decreased stride length;Trunk flexed Gait velocity: decreased Stairs: No    Exercises General Exercises - Lower Extremity Long Arc Quad: AROM;Both;15 reps;Seated Hip ABduction/ADduction: AROM;Both;15 reps;Seated Hip Flexion/Marching: AROM;Both;15 reps;Seated   PT Diagnosis:    PT Problem List:   PT Treatment Interventions:     PT Goals Acute Rehab PT Goals Pt will go Sit to Stand: with supervision;with upper extremity assist PT Goal: Sit to Stand - Progress: Updated due to goal met PT Transfer Goal: Bed to Chair/Chair to Bed - Progress: Progressing toward goal PT Goal: Ambulate - Progress: Progressing toward goal PT Goal: Perform Home Exercise Program - Progress: Progressing toward goal  Visit Information  Last PT Received On: 09/25/12 Assistance Needed: +2 (safety)    Subjective Data  Subjective: [place]   Cognition  Cognition Overall Cognitive Status: History of cognitive impairments - at baseline Orientation Level: Disoriented to;Situation;Place;Time Following Commands: Follows one step commands consistently    Balance     End of Session PT - End of Session Equipment Utilized During Treatment: Gait belt;Left lower extremity prosthesis Activity Tolerance: Patient tolerated treatment well Patient left: in chair;with call bell/phone within reach Nurse Communication: Mobility status   GP     Toney Sang Beth 09/25/2012, 11:13 AM Delaney Meigs, PT [phone]

## 2012-09-25 NOTE — Progress Notes (Signed)
Attempted to call report. Nurse to call back. 

## 2012-09-25 NOTE — Progress Notes (Signed)
Met with patient and her husband at bedside to discuss CIR/discharge plans. Explained that CIR does not have a bed available today. Discussed option for rehab at a SNF and encouraged husband to begin working with the CSW towards SNF as a back-up plan. Husband in agreement. Informed pt's CSW of above. Will continue to follow.  Please call [phone] for questions.

## 2012-09-25 NOTE — Progress Notes (Addendum)
Patient ID: Elizabeth [name], female   DOB: 1940/09/17, 72 y.o.   MRN: [id number]    Subjective: C/O constipation and some soreness "all over"  Objective: Vital signs in last 24 hours: Temp:  [97.4 F (36.3 C)-98.3 F (36.8 C)] 98.1 F (36.7 C) (02/04 0813) Pulse Rate:  [61-66] 62  (02/04 0813) Resp:  [13-19] 17  (02/04 0813) BP: (128-168)/(45-124) 148/57 mmHg (02/04 0813) SpO2:  [94 %-100 %] 98 % (02/04 0813) Weight:  [64.8 kg (142 lb 13.7 oz)] 64.8 kg (142 lb 13.7 oz) (02/04 0550) Last BM Date: 09/24/12  Intake/Output from previous day: 02/03 0701 - 02/04 0700 In: 1350.8 [P.O.:300; I.V.:1050.8] Out: 1026 [Urine:1025; Stool:1] Intake/Output this shift: Total I/O In: -  Out: 450 [Urine:450]  General appearance: cooperative Head: right facial ecchymosis evolving Neck: right neck ecchymosis evolving Resp: clear to auscultation bilaterally Cardio: regular rate and rhythm GI: soft, NT, +BS Extremities: LLE amp Neuro: F/C Cardio: correction irregularly irregular rhythm  Lab Results: CBC   Basename 09/24/12 0520 09/23/12 0450  WBC 6.2 7.1  HGB 10.4* 11.1*  HCT 32.0* 33.4*  PLT 135* 133*   BMET  Basename 09/24/12 0520 09/23/12 0450  NA 137 139  K 3.7 3.8  CL 102 101  CO2 29 32  GLUCOSE 43* 132*  BUN 15 10  CREATININE 0.71 0.67  CALCIUM 8.7 9.1   PT/INR  Basename 09/25/12 0610 09/24/12 0520  LABPROT 17.4* 13.7  INR 1.47 1.06   ABG No results found for this basename: PHART:2,PCO2:2,PO2:2,HCO3:2 in the last 72 hours  Studies/Results: Mr Brain Wo Contrast  09/23/2012  *RADIOLOGY REPORT*  Clinical Data: Question CVA.  Altered mental status.  MRI HEAD WITHOUT CONTRAST  Technique:  Multiplanar, multiecho pulse sequences of the brain and surrounding structures were obtained according to standard protocol without intravenous contrast.  Comparison: CT head without contrast 09/21/2012 and MRI brain without and with contrast 11/02/2010.  Findings: Remote  encephalomalacia of the right occipital lobe and left cerebellum is stable.  Smaller lacunar infarcts are present in the right cerebellar hemisphere.  The diffusion weighted images demonstrate no evidence for acute or subacute infarction.  No acute hemorrhage or mass lesion is present.  There are blood products associated with both areas of encephalomalacia.  Mild generalized atrophy is present.  Mild periventricular and scattered subcortical T2 changes are present in addition to the areas of encephalomalacia.  The general pattern is similar to the prior MRI.  Flow is present in the major intracranial arteries.  The globes and orbits are intact.  Chronic right maxillary sinus disease is present with a shrunken sinus.  Extensive mucosal thickening is present in the right greater than left ethmoid air cells and bilateral sphenoid sinuses.  Minimal mucosal disease is noted in the right frontal sinus.  Bilateral mastoid effusions are present, right greater than left. A right mastoid effusion is present.  No obstructing nasopharyngeal lesions are present.  IMPRESSION:  1.  No acute intracranial abnormality. 2.  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 3.  Atrophy and white matter disease as described.  This likely reflects the sequelae of chronic microvascular ischemia. 4.  Acute and chronic sinus disease as described. 5.  Right greater than left mastoid effusion.  No obstructing nasopharyngeal lesions are evident.   Original Report Authenticated By: Marin Roberts, M.D.     Anti-infectives: Anti-infectives     Start     Dose/Rate Route Frequency Ordered Stop  09/20/12 2330   ceFAZolin (ANCEF) IVPB 1 g/50 mL premix  Status:  Discontinued        1 g 100 mL/hr over 30 Minutes Intravenous 3 times per day 09/20/12 2319 09/22/12 0801          Assessment/Plan: Fall R orbit/maxilla/zygoma Fxs - no surgery needed per Dr. Chales Salmon A fib - on  Coumadin, stable rate Hypoglycemia - resolved, appreciate IM assist Syncope W/U - outpatient Holter per IM rec VTE - Coumadin FEN - add MIralax Dispo - possible CIR   LOS: 5 days    Violeta Gelinas, MD, MPH, FACS Pager: [phone]  09/25/2012

## 2012-09-25 NOTE — Progress Notes (Addendum)
Subjective: Today patient is more alert and oriented.Blood glucose is better. Will add HS coverage. Patient gets confused at bedtime. Objective: Vital signs in last 24 hours: Temp:  [97.4 F (36.3 C)-98.3 F (36.8 C)] 98.3 F (36.8 C) (02/04 0400) Pulse Rate:  [61-66] 65  (02/04 0550) Resp:  [13-19] 19  (02/04 0550) BP: (128-168)/(45-124) 153/50 mmHg (02/04 0400) SpO2:  [94 %-100 %] 96 % (02/04 0550) Weight:  [64.8 kg (142 lb 13.7 oz)] 64.8 kg (142 lb 13.7 oz) (02/04 0550) Weight change: 1.4 kg (3 lb 1.4 oz) Last BM Date: 09/24/12   Intake/Output from previous day: 02/03 0701 - 02/04 0700 In: 1350.8 [P.O.:300; I.V.:1050.8] Out: 1026 [Urine:1025; Stool:1]     Physical Exam: Head: Normocephalic, atraumatic.  Eyes: No signs of jaundice, EOMI Nose: Mucous membranes dry.  Neck: supple,No deformities, masses, or tenderness noted. Lungs: Normal respiratory effort. B/L Clear to auscultation, no crackles or wheezes.  Heart: Regular RR. S1 and S2 normal  Abdomen: BS normoactive. Soft, Nondistended, non-tender.  Extremities: No pretibial edema, no erythema Neuro: alert oriented x 3, moving all extremities  Lab Results: Basic Metabolic Panel:  Basename 09/24/12 0520 09/23/12 0450  NA 137 139  K 3.7 3.8  CL 102 101  CO2 29 32  GLUCOSE 43* 132*  BUN 15 10  CREATININE 0.71 0.67  CALCIUM 8.7 9.1  MG -- --  PHOS -- --   Liver Function Tests: No results found for this basename: AST:2,ALT:2,ALKPHOS:2,BILITOT:2,PROT:2,ALBUMIN:2 in the last 72 hours No results found for this basename: LIPASE:2,AMYLASE:2 in the last 72 hours No results found for this basename: AMMONIA:2 in the last 72 hours CBC:  Basename 09/24/12 0520 09/23/12 0450  WBC 6.2 7.1  NEUTROABS -- --  HGB 10.4* 11.1*  HCT 32.0* 33.4*  MCV 90.9 89.8  PLT 135* 133*   Cardiac Enzymes: No results found for this basename: CKTOTAL:3,CKMB:3,CKMBINDEX:3,TROPONINI:3 in the last 72 hours:  Basename 09/25/12 0637  09/24/12 2127 09/24/12 1640 09/24/12 1216 09/24/12 0830 09/24/12 0717  GLUCAP 208* 224* 185* 154* 111* 159*   Hemoglobin A1C: No results found for this basename: HGBA1C in the last 72 hours Fasting Lipid Panel: No results found for this basename: CHOL,HDL,LDLCALC,TRIG,CHOLHDL,LDLDIRECT in the last 72 hours Thyroid Function Tests: No results found for this basename: TSH,T4TOTAL,FREET4,T3FREE,THYROIDAB in the last 72 hours Anemia Panel:  Basename 09/23/12 0850  VITAMINB12 952*  FOLATE --  FERRITIN --  TIBC --  IRON --  RETICCTPCT --   Coagulation:  Basename 09/25/12 0610 09/24/12 0520  LABPROT 17.4* 13.7  INR 1.47 1.06   Urine Drug Screen: Drugs of Abuse     Component Value Date/Time   LABOPIA NONE DETECTED 09/20/2012 2047   COCAINSCRNUR NONE DETECTED 09/20/2012 2047   LABBENZ NONE DETECTED 09/20/2012 2047   AMPHETMU NONE DETECTED 09/20/2012 2047   THCU NONE DETECTED 09/20/2012 2047   LABBARB NONE DETECTED 09/20/2012 2047    Alcohol Level: No results found for this basename: ETH:2 in the last 72 hours Urinalysis: No results found for this basename: COLORURINE:2,APPERANCEUR:2,LABSPEC:2,PHURINE:2,GLUCOSEU:2,HGBUR:2,BILIRUBINUR:2,KETONESUR:2,PROTEINUR:2,UROBILINOGEN:2,NITRITE:2,LEUKOCYTESUR:2 in the last 72 hours Misc. Labs:  Recent Results (from the past 240 hour(s))  MRSA PCR SCREENING     Status: Normal   Collection Time   09/20/12  9:57 PM      Component Value Range Status Comment   MRSA by PCR NEGATIVE  NEGATIVE Final   URINE CULTURE     Status: Normal   Collection Time   09/21/12  1:15 AM  Component Value Range Status Comment   Specimen Description URINE, CATHETERIZED   Final    Special Requests Normal   Final    Culture  Setup Time 09/21/2012 02:11   Final    Colony Count NO GROWTH   Final    Culture NO GROWTH   Final    Report Status 09/22/2012 FINAL   Final     Studies/Results: Mr Brain Wo Contrast  09/23/2012  *RADIOLOGY REPORT*  Clinical Data: Question  CVA.  Altered mental status.  MRI HEAD WITHOUT CONTRAST  Technique:  Multiplanar, multiecho pulse sequences of the brain and surrounding structures were obtained according to standard protocol without intravenous contrast.  Comparison: CT head without contrast 09/21/2012 and MRI brain without and with contrast 11/02/2010.  Findings: Remote encephalomalacia of the right occipital lobe and left cerebellum is stable.  Smaller lacunar infarcts are present in the right cerebellar hemisphere.  The diffusion weighted images demonstrate no evidence for acute or subacute infarction.  No acute hemorrhage or mass lesion is present.  There are blood products associated with both areas of encephalomalacia.  Mild generalized atrophy is present.  Mild periventricular and scattered subcortical T2 changes are present in addition to the areas of encephalomalacia.  The general pattern is similar to the prior MRI.  Flow is present in the major intracranial arteries.  The globes and orbits are intact.  Chronic right maxillary sinus disease is present with a shrunken sinus.  Extensive mucosal thickening is present in the right greater than left ethmoid air cells and bilateral sphenoid sinuses.  Minimal mucosal disease is noted in the right frontal sinus.  Bilateral mastoid effusions are present, right greater than left. A right mastoid effusion is present.  No obstructing nasopharyngeal lesions are present.  IMPRESSION:  1.  No acute intracranial abnormality. 2.  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 3.  Atrophy and white matter disease as described.  This likely reflects the sequelae of chronic microvascular ischemia. 4.  Acute and chronic sinus disease as described. 5.  Right greater than left mastoid effusion.  No obstructing nasopharyngeal lesions are evident.   Original Report Authenticated By: Marin Roberts, M.D.     Medications: Scheduled Meds:    .  feeding supplement  237 mL Oral TID BM  . fludrocortisone  0.1 mg Oral QODAY  . insulin aspart  1-5 Units Subcutaneous TID WC & HS  . insulin glargine  6 Units Subcutaneous Daily  . levothyroxine  150 mcg Oral Custom  . levothyroxine  175 mcg Oral Custom  . mycophenolate  360 mg Oral BID  . neomycin-bacitracin-polymyxin   Topical Daily  . ondansetron (ZOFRAN) IV  4 mg Intravenous Once  . pantoprazole  40 mg Oral Daily  . predniSONE  5 mg Oral q morning - 10a  . tacrolimus  1 mg Oral QHS  . tacrolimus  2 mg Oral Daily  . warfarin  5 mg Oral q1800  . Warfarin - Physician Dosing Inpatient   Does not apply q1800   Continuous Infusions:    . dextrose 5 % and 0.9 % NaCl with KCl 20 mEq/L 50 mL/hr (09/25/12 0540)   PRN Meds:.acetaminophen, ondansetron (ZOFRAN) IV, ondansetron, traMADol  Assessment/Plan:  Active Problems:     Altered mental status/delirium  Improved Most likely multifactorial, secondary to opioids, underlying baseline dementia. B12 and folate are normal MRI is negative for CVA D/c  Elavil Minimise opioids use Start Remeron 7.5 mh  po q hs for sleep  Hypoglycemia / Diabetes mellitus Hba1c is 6.3 Patient still having hypoglycemia in am.Called and discussed with Dr Talmage Nap, patient's endocrinologist. She recommends to cut down the dose of Lantus to 6 units daily and also continue with current sliding scale 1-5 units. If patient's blood sugar are stable on this current regimen she can be discharged home on this regimen and followup with Dr. Talmage Nap as outpatient  Questionable syncope  Patient does have history of severe orthostatic hypotension and has been taking Florinef 0.1 mg every other day. The patient's loss of consciousness is most likely due to the hyperglycemia but patient would benefit from outpatient Holter monitoring. Patient has a cardiologist in [place] and who can set up a Holter monitor as outpatient. In the meantime we'll continue to observe the patient  on telemetry in the hospital.   Hypothyroidism  TSH is normal, continue Synthroid   Orthostatic hypotension  Continue Florinef 0.1 mg every other day   Status post renal transplant  Continue prednisone, Prograf   Atrial fibrillation  ContinueCoumadin , INR is subtherapeutic.  Pharmacy to adjust the dose Heart rate is controlled at this time  Currently patient is in normal sinus rhythm   Generailzed pain sec to fall Continue prn tramadol   LOS: 5 days    Dispo:   [facility] Pager: [phone] 09/25/2012, 7:51 AM

## 2012-09-25 NOTE — Progress Notes (Signed)
Transferred to [address]

## 2012-09-25 NOTE — Progress Notes (Signed)
MD called regarding patient's blood pressure 185/65 pulse 62. No new orders written. Will continue to monitor pressure.

## 2012-09-25 NOTE — Progress Notes (Signed)
Report called to Lauren, RN.

## 2012-09-25 NOTE — Clinical Social Work Note (Signed)
Clinical Social Worker continuing to follow patient and family for emotional support and to facilitate patient discharge needs.  CSW spoke with patient and patient husband at bedside to further discuss patient discharge plan and possible SNF placement.  CSW received information from inpatient rehab stating that patient is a good candidate for rehab, however there is no bed availability at this time.  Patient and patient husband agreeable to extending search to [facility] at this time for placement needs - CSW has submitted clinicals and will await follow up decision.  CSW to follow up with patient and patient husband to provide bed offer if and when available.  Clinical Social Worker remains available for emotional support and to facilitate patient discharge needs once medically stable.  [name], [place] [phone]

## 2012-09-26 DIAGNOSIS — E162 Hypoglycemia, unspecified: Secondary | ICD-10-CM

## 2012-09-26 DIAGNOSIS — G9340 Encephalopathy, unspecified: Secondary | ICD-10-CM

## 2012-09-26 DIAGNOSIS — E119 Type 2 diabetes mellitus without complications: Secondary | ICD-10-CM

## 2012-09-26 LAB — GLUCOSE, CAPILLARY: Glucose-Capillary: 128 mg/dL — ABNORMAL HIGH (ref 70–99)

## 2012-09-26 MED ORDER — AMITRIPTYLINE HCL 10 MG PO TABS
10.0000 mg | ORAL_TABLET | Freq: Every day | ORAL | Status: DC
  Administered 2012-09-27 (×2): 10 mg via ORAL
  Filled 2012-09-26 (×4): qty 1

## 2012-09-26 MED ORDER — SODIUM CHLORIDE 0.9 % IV BOLUS (SEPSIS): 500.0000 mL | Freq: Once | INTRAVENOUS | Status: DC

## 2012-09-26 MED ORDER — TRAMADOL HCL 50 MG PO TABS
25.0000 mg | ORAL_TABLET | Freq: Four times a day (QID) | ORAL | Status: DC | PRN
  Administered 2012-09-26: 50 mg via ORAL
  Filled 2012-09-26: qty 1

## 2012-09-26 MED ORDER — TRAMADOL HCL 50 MG PO TABS
50.0000 mg | ORAL_TABLET | Freq: Four times a day (QID) | ORAL | Status: DC
  Administered 2012-09-26 – 2012-09-28 (×8): 50 mg via ORAL
  Filled 2012-09-26 (×12): qty 1

## 2012-09-26 NOTE — Consult Note (Signed)
[facility] CONSULT FOLLOW-UP NOTE  Elizabeth [name] GNF:[id number] DOB: 12/29/1940 DOA: 09/20/2012 PCP: Irena Cords, MD Endocrinologist: Dr. Talmage Nap. Attending service: Trauma service Reason for consultation: Hypoglycemia  Impression/Recommendations: 1. Hypoglycemia with diabetes mellitus: Resolved. Last episode 2/3. Dr. Sharl Ma discussed with the patient's endocrinologist. It was recommended that Lantus be decreased to 6 units daily and continue current sliding scale. Appears sensitive to insulin.  2. Subacute encephalopathy superimposed on chronic dementia: Most likely multifactorial, hypoglycemia, opioids, underlying baseline dementia. B12 and folate are normal, MRI is negative for CVA, Minimise opioids use. 3. Fall: Possibly secondary to severe hypoglycemia. 4. Syncope?: Consider outpatient monitoring. 5. Orthostatic hypotension: Continue fludrocortisone. 6. Chronic kidney disease status post transplant 2001: Continue prednisone, Prograf. 7. Hypothyroidism: TSH normal. 8. Atrial fibrillation: On warfarin. 9. Esophageal neurogenic pain: Resume Elavil at lower dose 10. History of stroke with short-term memory loss.   11. Facial fractures: No intervention per surgery. 12. Dementia with hallucinations: Chronic problem.   Family Communication:  Discussed with husband at bedside   Brendia Sacks, MD  [facility] Team 6 Pager [phone] If 7PM-7AM, please contact night-coverage at [email or web address], password [facility] 09/26/2012, 1:19 PM  LOS: 6 days   Brief narrative: 72 year old female with a history of diabetes mellitus, orthostatic hypotension who has been on Florinef 0.1 mg every other day for past many months, history of CVA currently on anticoagulation, end-stage renal disease, status post kidney transplant in December 2001, uropathy, CAD status post CABG, peripheral vascular disease status post left BKA, atrial fibrillation who was brought to the hospital after patient was  found on the floor by patient's husband. EMS was called. Patient's blood glucose was found to be 47. Patient has been taking Lantus 10 units in the morning and has been taking NovoLog sliding scale before meals. As per husband he checked patient's blood sugar at lunch time yesterday and it was 147 for that he gave her 4 units of NovoLog. As per husband the patient was not able to get snacks after lunchtime so she might have dropped the blood sugars. This morning patient also was found to be 17, when patient came to the hospital she was started on NovoLog sliding scale every 4 hours and patient did receive insulin this morning around 4 AM when her blood sugar was 253. Patient has not been eating since she came to the hospital   Other consultants:  Physical therapy: Skilled nursing facility.  Speech therapy: Inpatient rehabilitation.  Physical medicine and rehabilitation: CIR  HPI/Subjective: Hallucinated last night requiring Haldol. Husband at bedside reports patient did well on Elavil the first month, although developed some hallucinations more recently. However these were mild and do not interfere with her care. He thinks she would do better on resumption of Elavil.  Objective: Filed Vitals:   09/25/12 2030 09/25/12 2107 09/26/12 0216 09/26/12 0500  BP: 185/66 181/74 136/47 136/48  Pulse: 65 62 64 66  Temp: 97.4 F (36.3 C)  98.6 F (37 C) 97.4 F (36.3 C)  TempSrc: Oral     Resp: 17  17 17   Weight:      SpO2: 97%  98% 97%    Intake/Output Summary (Last 24 hours) at 09/26/12 1319 Last data filed at 09/26/12 0514  Gross per 24 hour  Intake 995.33 ml  Output    150 ml  Net 845.33 ml    Exam:  General:  Appears calm and comfortable, confused Cardiovascular: RRR, no m/r/g. No LE edema. Respiratory: CTA bilaterally, no w/r/r. Normal  respiratory effort. Psychiatric: Appears confused.  Data Reviewed: Basic Metabolic Panel:  Lab 09/24/12 1610 09/23/12 0450 09/21/12 0423  09/20/12 1746 09/20/12 1736  NA 137 139 135 137 137  K 3.7 3.8 4.4 4.0 4.1  CL 102 101 99 101 100  CO2 29 32 29 -- 25  GLUCOSE 43* 132* 276* 217* 220*  BUN 15 10 24* 23 21  CREATININE 0.71 0.67 0.76 1.00 0.82  CALCIUM 8.7 9.1 9.1 -- 9.9  MG -- -- -- -- --  PHOS -- -- -- -- --   Liver Function Tests:  Lab 09/20/12 1736  AST 27  ALT 16  ALKPHOS 63  BILITOT 0.5  PROT 6.3  ALBUMIN 3.6   CBC:  Lab 09/24/12 0520 09/23/12 0450 09/21/12 0423 09/20/12 1746 09/20/12 1736  WBC 6.2 7.1 7.5 -- 10.0  NEUTROABS -- -- -- -- --  HGB 10.4* 11.1* 11.2* 13.3 12.9  HCT 32.0* 33.4* 34.3* 39.0 39.2  MCV 90.9 89.8 90.3 -- 90.1  PLT 135* 133* 135* -- 167   Cardiac Enzymes:  Lab 09/20/12 1723  CKTOTAL 114  CKMB 3.2  CKMBINDEX --  TROPONINI <0.30   CBG:  Lab 09/25/12 2208 09/25/12 1718 09/25/12 1218 09/25/12 0811 09/25/12 0637  GLUCAP 190* 204* 219* 188* 208*    Recent Results (from the past 240 hour(s))  MRSA PCR SCREENING     Status: Normal   Collection Time   09/20/12  9:57 PM      Component Value Range Status Comment   MRSA by PCR NEGATIVE  NEGATIVE Final   URINE CULTURE     Status: Normal   Collection Time   09/21/12  1:15 AM      Component Value Range Status Comment   Specimen Description URINE, CATHETERIZED   Final    Special Requests Normal   Final    Culture  Setup Time 09/21/2012 02:11   Final    Colony Count NO GROWTH   Final    Culture NO GROWTH   Final    Report Status 09/22/2012 FINAL   Final      Studies:   Scheduled Meds:   . docusate sodium  100 mg Oral BID  . feeding supplement  237 mL Oral TID BM  . fludrocortisone  0.1 mg Oral QODAY  . insulin aspart  1-5 Units Subcutaneous TID WC & HS  . insulin glargine  6 Units Subcutaneous Daily  . levothyroxine  150 mcg Oral Custom  . levothyroxine  175 mcg Oral Custom  . mirtazapine  7.5 mg Oral QHS  . mycophenolate  360 mg Oral BID  . neomycin-bacitracin-polymyxin   Topical Daily  . ondansetron (ZOFRAN) IV   4 mg Intravenous Once  . pantoprazole  40 mg Oral Daily  . polyethylene glycol  17 g Oral Daily  . predniSONE  5 mg Oral q morning - 10a  . tacrolimus  1 mg Oral QHS  . tacrolimus  2 mg Oral Daily  . traMADol  50 mg Oral Q6H  . warfarin  5 mg Oral q1800  . Warfarin - Physician Dosing Inpatient   Does not apply q1800   Continuous Infusions:    Active Problems:  DIABETES MELLITUS, TYPE II  Fall  Right orbit fracture  Zygoma fracture  Fracture of right maxilla  Concussion, unspecified  Hypoglycemia    Time Spent: 25 minutes  Brendia Sacks, MD  [facility] Team 6 Pager [phone] If 7PM-7AM, please contact night-coverage at [email or web address], password [facility]  09/26/2012, 1:19 PM  LOS: 6 days

## 2012-09-26 NOTE — Progress Notes (Signed)
OT Cancellation Note  Patient Details Name: Elizabeth [name] MRN: [id number] DOB: 06/04/1941   Cancelled Treatment:    Reason Eval/Treat Not Completed: Fatigue/lethargy limiting ability to participate.  Will re-attempt  [name], Ursula Alert M 09/26/2012, 12:53 PM

## 2012-09-26 NOTE — Progress Notes (Signed)
Patient ID: Elizabeth [name], female   DOB: Nov 12, 1940, 72 y.o.   MRN: [id number]   LOS: 6 days   Subjective: Still c/o soreness all over, says pain medicine doesn't seem to help.  Objective: Vital signs in last 24 hours: Temp:  [97.4 F (36.3 C)-98.6 F (37 C)] 97.4 F (36.3 C) (02/05 0500) Pulse Rate:  [62-67] 66  (02/05 0500) Resp:  [16-18] 17  (02/05 0500) BP: (136-191)/(47-112) 136/48 mmHg (02/05 0500) SpO2:  [97 %-100 %] 97 % (02/05 0500) Last BM Date: 09/25/12   Lab Results Lab Results  Component Value Date   INR 1.70* 09/26/2012   INR 1.47 09/25/2012   INR 1.06 09/24/2012   CBG (last 3)   Basename 09/25/12 2208 09/25/12 1718 09/25/12 1218  GLUCAP 190* 204* 219*      General appearance: alert and no distress Resp: clear to auscultation bilaterally Cardio: regular rate and rhythm GI: normal findings: bowel sounds normal and soft, non-tender Extremities: Warm and dry   Assessment/Plan: Fall  R orbit/maxilla/zygoma Fxs - no surgery needed per Dr. Chales Salmon  A fib - on Coumadin, stable rate  Hypoglycemia - resolved, appreciate IM assist  Syncope W/U - outpatient Holter per IM rec  VTE - Coumadin  FEN - Will schedule low dose tramadol, keep prn dose Dispo - possible CIR vs SNF    Freeman Caldron, PA-C Pager: [phone] General Trauma PA Pager: [phone]   09/26/2012

## 2012-09-26 NOTE — Progress Notes (Signed)
No changes.  Still awaiting Rehab.  This patient has been seen and I agree with the findings and treatment plan.  Marta Lamas. Gae Bon, MD, FACS [phone] (pager) [phone] (direct pager) Trauma Surgeon

## 2012-09-26 NOTE — Progress Notes (Signed)
CIR Admissions update: CIR bed not available for this patient today. Pt's CSW aware and is assisting pt with SNF placement. For questions please call [phone]

## 2012-09-27 DIAGNOSIS — N390 Urinary tract infection, site not specified: Secondary | ICD-10-CM

## 2012-09-27 LAB — GLUCOSE, CAPILLARY
Glucose-Capillary: 275 mg/dL — ABNORMAL HIGH (ref 70–99)
Glucose-Capillary: 125 mg/dL — ABNORMAL HIGH (ref 70–99)

## 2012-09-27 LAB — PROTIME-INR
INR: 2.01 — ABNORMAL HIGH (ref 0.00–1.49)
Prothrombin Time: 22 seconds — ABNORMAL HIGH (ref 11.6–15.2)

## 2012-09-27 LAB — URINALYSIS, ROUTINE W REFLEX MICROSCOPIC
Glucose, UA: NEGATIVE mg/dL
Specific Gravity, Urine: 1.013 (ref 1.005–1.030)
Urobilinogen, UA: 0.2 mg/dL (ref 0.0–1.0)

## 2012-09-27 LAB — URINE MICROSCOPIC-ADD ON

## 2012-09-27 LAB — BASIC METABOLIC PANEL
BUN: 13 mg/dL (ref 6–23)
CO2: 31 mEq/L (ref 19–32)
Calcium: 9.5 mg/dL (ref 8.4–10.5)
Creatinine, Ser: 0.64 mg/dL (ref 0.50–1.10)

## 2012-09-27 LAB — CBC
HCT: 32.3 % — ABNORMAL LOW (ref 36.0–46.0)
MCH: 30.1 pg (ref 26.0–34.0)
MCV: 89.2 fL (ref 78.0–100.0)
Platelets: 158 10*3/uL (ref 150–400)
RBC: 3.62 MIL/uL — ABNORMAL LOW (ref 3.87–5.11)

## 2012-09-27 MED ORDER — CIPROFLOXACIN IN D5W 400 MG/200ML IV SOLN
400.0000 mg | Freq: Two times a day (BID) | INTRAVENOUS | Status: DC
  Administered 2012-09-27 – 2012-09-28 (×3): 400 mg via INTRAVENOUS
  Filled 2012-09-27 (×4): qty 200

## 2012-09-27 NOTE — Clinical Social Work Note (Signed)
Clinical Social Worker continuing to follow for support and discharge planning needs.  CSW spoke with inpatient rehab admissions coordinator who states that patient has a strong possibility for admission tomorrow pending successful discharges from inpatient rehab.  CSW will continue to follow for support and to assist with facilitating patient discharge plans if no bed available at inpatient rehab.  [name], [place] [phone]

## 2012-09-27 NOTE — Consult Note (Signed)
[facility] CONSULT FOLLOW-UP NOTE  Elizabeth [name] ZOX:[id number] DOB: Dec 28, 1940 DOA: 09/20/2012 PCP: Irena Cords, MD Endocrinologist: Dr. Talmage Nap. Attending service: Trauma service Reason for consultation: Hypoglycemia  Impression/Recommendations: 1. Urinary tract infection: Followup culture. Empiric Cipro already started. 2. Hypoglycemia with diabetes mellitus: Resolved. Last episode 2/3. Dr. Sharl Ma discussed with the patient's endocrinologist. It was recommended that Lantus be decreased to 6 units daily and continue current sliding scale. Appears sensitive to insulin.  3. Subacute encephalopathy superimposed on chronic dementia: Most likely multifactorial on presentation hypoglycemia, opioids, underlying baseline dementia. Now most likely secondary to UTI. B12 and folate are normal, MRI is negative for CVA, Minimise opioids use. 4. Fall: Possibly secondary to severe hypoglycemia. 5. Syncope?: Consider outpatient monitoring. 6. Orthostatic hypotension: Continue fludrocortisone. 7. Chronic kidney disease status post transplant 2001: Continue prednisone, Prograf. 8. Hypothyroidism: TSH normal. 9. Atrial fibrillation: On warfarin. 10. Esophageal neurogenic pain: Resume Elavil at lower dose 11. History of stroke with short-term memory loss.   12. Facial fractures: No intervention per surgery. 13. Dementia with hallucinations: Chronic problem.   Family Communication:  None present   Brendia Sacks, MD  [facility] Team 6 Pager [phone] If 7PM-7AM, please contact night-coverage at [email or web address], password [facility] 09/27/2012, 2:22 PM  LOS: 7 days   Brief narrative: 72 year old female with a history of diabetes mellitus, orthostatic hypotension who has been on Florinef 0.1 mg every other day for past many months, history of CVA currently on anticoagulation, end-stage renal disease, status post kidney transplant in December 2001, uropathy, CAD status post CABG, peripheral  vascular disease status post left BKA, atrial fibrillation who was brought to the hospital after patient was found on the floor by patient's husband. EMS was called. Patient's blood glucose was found to be 47. Patient has been taking Lantus 10 units in the morning and has been taking NovoLog sliding scale before meals. As per husband he checked patient's blood sugar at lunch time yesterday and it was 147 for that he gave her 4 units of NovoLog. As per husband the patient was not able to get snacks after lunchtime so she might have dropped the blood sugars. This morning patient also was found to be 17, when patient came to the hospital she was started on NovoLog sliding scale every 4 hours and patient did receive insulin this morning around 4 AM when her blood sugar was 253. Patient has not been eating since she came to the hospital  Other consultants:  Physical therapy: Skilled nursing facility.  Speech therapy: Inpatient rehabilitation.  Physical medicine and rehabilitation: CIR  HPI/Subjective: Found to have urinary retention and urinary tract infection. Less confused today per RN, more cooperative.  Objective: Filed Vitals:   09/26/12 1810 09/26/12 2344 09/27/12 0231 09/27/12 0500  BP: 174/69 155/81 176/72 187/71  Pulse: 64 64 66 69  Temp: 98 F (36.7 C) 97.4 F (36.3 C) 97.7 F (36.5 C) 97.7 F (36.5 C)  TempSrc: Oral Axillary Axillary Oral  Resp: 17 17 18 18   Weight:      SpO2: 99% 99% 97% 97%    Intake/Output Summary (Last 24 hours) at 09/27/12 1422 Last data filed at 09/27/12 0846  Gross per 24 hour  Intake    180 ml  Output      0 ml  Net    180 ml    Exam:  General:  Appears calm and comfortable Cardiovascular: RRR, no m/r/g. No LE edema. Respiratory: CTA bilaterally, no w/r/r. Normal respiratory effort.  Data  Reviewed: Basic Metabolic Panel:  Lab 09/27/12 0454 09/24/12 0520 09/23/12 0450 09/21/12 0423 09/20/12 1746 09/20/12 1736  NA 137 137 139 135 137 --  K  3.7 3.7 3.8 4.4 4.0 --  CL 98 102 101 99 101 --  CO2 31 29 32 29 -- 25  GLUCOSE 163* 43* 132* 276* 217* --  BUN 13 15 10  24* 23 --  CREATININE 0.64 0.71 0.67 0.76 1.00 --  CALCIUM 9.5 8.7 9.1 9.1 -- 9.9  MG -- -- -- -- -- --  PHOS -- -- -- -- -- --   Liver Function Tests:  Lab 09/20/12 1736  AST 27  ALT 16  ALKPHOS 63  BILITOT 0.5  PROT 6.3  ALBUMIN 3.6   CBC:  Lab 09/27/12 0816 09/24/12 0520 09/23/12 0450 09/21/12 0423 09/20/12 1746 09/20/12 1736  WBC 6.8 6.2 7.1 7.5 -- 10.0  NEUTROABS -- -- -- -- -- --  HGB 10.9* 10.4* 11.1* 11.2* 13.3 --  HCT 32.3* 32.0* 33.4* 34.3* 39.0 --  MCV 89.2 90.9 89.8 90.3 -- 90.1  PLT 158 135* 133* 135* -- 167   Cardiac Enzymes:  Lab 09/20/12 1723  CKTOTAL 114  CKMB 3.2  CKMBINDEX --  TROPONINI <0.30   CBG:  Lab 09/27/12 1207 09/27/12 0811 09/26/12 2340 09/26/12 1656 09/26/12 1200  GLUCAP 275* 153* 133* 126* 226*    Recent Results (from the past 240 hour(s))  MRSA PCR SCREENING     Status: Normal   Collection Time   09/20/12  9:57 PM      Component Value Range Status Comment   MRSA by PCR NEGATIVE  NEGATIVE Final   URINE CULTURE     Status: Normal   Collection Time   09/21/12  1:15 AM      Component Value Range Status Comment   Specimen Description URINE, CATHETERIZED   Final    Special Requests Normal   Final    Culture  Setup Time 09/21/2012 02:11   Final    Colony Count NO GROWTH   Final    Culture NO GROWTH   Final    Report Status 09/22/2012 FINAL   Final      Studies:   Scheduled Meds:    . amitriptyline  10 mg Oral QHS  . ciprofloxacin  400 mg Intravenous Q12H  . docusate sodium  100 mg Oral BID  . fludrocortisone  0.1 mg Oral QODAY  . insulin aspart  1-5 Units Subcutaneous TID WC & HS  . insulin glargine  6 Units Subcutaneous Daily  . levothyroxine  150 mcg Oral Custom  . levothyroxine  175 mcg Oral Custom  . mycophenolate  360 mg Oral BID  . neomycin-bacitracin-polymyxin   Topical Daily  . ondansetron  (ZOFRAN) IV  4 mg Intravenous Once  . pantoprazole  40 mg Oral Daily  . polyethylene glycol  17 g Oral Daily  . predniSONE  5 mg Oral q morning - 10a  . tacrolimus  1 mg Oral QHS  . tacrolimus  2 mg Oral Daily  . traMADol  50 mg Oral Q6H  . warfarin  5 mg Oral q1800  . Warfarin - Physician Dosing Inpatient   Does not apply q1800   Continuous Infusions:    Active Problems:  DIABETES MELLITUS, TYPE II  Fall  Right orbit fracture  Zygoma fracture  Fracture of right maxilla  Concussion, unspecified  Hypoglycemia  Encephalopathy    Time Spent: 20 minutes  Brendia Sacks, MD  [facility] Team 6 Pager [phone] If 7PM-7AM, please contact night-coverage at [email or web address], password [facility] 09/27/2012, 2:22 PM  LOS: 7 days

## 2012-09-27 NOTE — Progress Notes (Signed)
Check U/A, CBC, BMET Will check availability of CIR Patient examined and I agree with the assessment and plan  Violeta Gelinas, MD, MPH, FACS Pager: [phone]  09/27/2012 8:36 AM

## 2012-09-27 NOTE — Progress Notes (Signed)
UR completed 

## 2012-09-27 NOTE — Progress Notes (Signed)
Physical Therapy Treatment Patient Details Name: Elizabeth [name] MRN: [id number] DOB: 1940-11-27 Today's Date: 09/27/2012 Time: 1308-6578 PT Time Calculation (min): 39 min  PT Assessment / Plan / Recommendation Comments on Treatment Session  Pt very lethargic and positive for dizziness throughout session with intermittant resolution. Patient max assist for donning/doffing brace but able to verbalize some instructions for sequencing with minimal cueing. Pt had decreased standing tolerance and appeared very axious with ambulation today. Will continue to work with pt to progress acitivty.    Follow Up Recommendations  CIR;Supervision/Assistance - 24 hour     Does the patient have the potential to tolerate intense rehabilitation     Barriers to Discharge        Equipment Recommendations       Recommendations for Other Services    Frequency Min 3X/week   Plan Discharge plan needs to be updated;Frequency remains appropriate    Precautions / Restrictions Precautions Precautions: Fall Required Braces or Orthoses: Other Brace/Splint Other Brace/Splint: R KAFO, L BKA prosthesis Restrictions Weight Bearing Restrictions: No   Pertinent Vitals/Pain "I have pain from my fall all over" No numerical value given    Mobility  Bed Mobility Bed Mobility: Rolling Right;Right Sidelying to Sit;Sitting - Scoot to [place] of Bed Rolling Right: 4: Min assist Right Sidelying to Sit: 3: Mod assist Sitting - Scoot to Edge of Bed: 3: Mod assist Details for Bed Mobility Assistance: Pt needed manual placement and assist to rol despite verbal cues. Assist increased for pull to sit and scoot to EOB. VCs for pt to open her eyes Transfers Transfers: Sit to Stand;Stand to Sit;Stand Pivot Transfers Sit to Stand: 3: Mod assist;From bed;With upper extremity assist Stand to Sit: 3: Mod assist;To bed;To chair/3-in-1 Stand Pivot Transfers: 3: Mod assist Details for Transfer Assistance: VC's for hand placement,  scooting forward, foot position with assist to acheive anterior translation.  Ambulation/Gait Ambulation/Gait Assistance: 1: +2 Total assist Ambulation/Gait: Patient Percentage: 80% Ambulation Distance (Feet): 8 Feet Assistive device: 2 person hand held assist Ambulation/Gait Assistance Details: Attempted ambulation with RW; un successful; Pt performed better ambulation with hand held assist and VCs for initiation of step Gait Pattern: Step-to pattern;Decreased stride length;Trunk flexed Gait velocity: decreased      PT Goals Acute Rehab PT Goals PT Goal Formulation: With patient PT Goal: Supine/Side to Sit - Progress: Progressing toward goal PT Goal: Sit at Edge Of Bed - Progress: Progressing toward goal PT Goal: Sit to Stand - Progress: Progressing toward goal PT Transfer Goal: Bed to Chair/Chair to Bed - Progress: Progressing toward goal PT Goal: Ambulate - Progress: Progressing toward goal  Visit Information  Last PT Received On: 09/27/12    Subjective Data  Subjective: "i feel dizzy"   Cognition  Cognition Overall Cognitive Status: History of cognitive impairments - at baseline Area of Impairment: Attention;Memory;Problem solving Arousal/Alertness: Lethargic Orientation Level: Disoriented to;Situation;Place;Time Behavior During Session: [facility] for tasks performed Current Attention Level: Selective Memory Deficits: history of memory deficits from previous CVA Problem Solving: Min A funcitonal basic. slow processing Cognition - Other Comments: Pt very disoriented with max cues for attention to task    Balance  Balance Balance Assessed: Yes Static Sitting Balance Static Sitting - Balance Support: Bilateral upper extremity supported;Feet supported Static Sitting - Level of Assistance: 5: Stand by assistance Static Sitting - Comment/# of Minutes: 4 Static Standing Balance Static Standing - Balance Support: Bilateral upper extremity supported Static Standing - Level of  Assistance: 2: Max assist Static Standing -  Comment/# of Minutes: 2 mins; +dizziness, VC's for arousal and to open eyes  End of Session PT - End of Session Equipment Utilized During Treatment: Gait belt;Left lower extremity prosthesis Activity Tolerance: Patient tolerated treatment well Patient left: in chair;with call bell/phone within reach Psychiatrist in room) Nurse Communication: Mobility status   GP     Fabio Asa 09/27/2012, 12:06 PM Charlotte Crumb, PT DPT  [phone]

## 2012-09-27 NOTE — Progress Notes (Signed)
Patient ID: Elizabeth [name], female   DOB: Jun 30, 1941, 72 y.o.   MRN: [id number]   LOS: 7 days   Subjective: Continues with a agitation. No c/o this morning. RN reports urinary frequency. PVR ~394ml this am.  Objective: Vital signs in last 24 hours: Temp:  [97.4 F (36.3 C)-98.2 F (36.8 C)] 97.7 F (36.5 C) (02/06 0500) Pulse Rate:  [64-70] 69  (02/06 0500) Resp:  [16-18] 18  (02/06 0500) BP: (85-187)/(45-81) 187/71 mmHg (02/06 0500) SpO2:  [92 %-100 %] 97 % (02/06 0500) Last BM Date: 09/26/12  Lab Results:  CBG (last 3)   Basename 09/26/12 2340 09/26/12 1656 09/26/12 1200  GLUCAP 133* 126* 226*   Lab Results  Component Value Date   INR 2.01* 09/27/2012   INR 1.70* 09/26/2012   INR 1.47 09/25/2012    General appearance: alert and no distress Resp: clear to auscultation bilaterally Cardio: irregularly irregular rhythm GI: normal findings: bowel sounds normal and soft, non-tender   Assessment/Plan: Fall  R orbit/maxilla/zygoma Fxs - no surgery needed per Dr. Chales Salmon  A fib - on Coumadin, stable rate  Hypoglycemia - resolved, appreciate IM assist  Syncope W/U - outpatient Holter per IM rec  VTE - Coumadin  FEN - Check UA, C&S, BMET, CBC. Husband requests tacrolimus level as well as she was supposed to have this done this week. Dispo - possible CIR vs SNF vs home. Much depends on management of agitation. If she has a UTI it would go a long way to explaining behavior.    Freeman Caldron, PA-C Pager: [phone] General Trauma PA Pager: [phone]   09/27/2012

## 2012-09-27 NOTE — Progress Notes (Signed)
NUTRITION FOLLOW UP  Intervention:   D/c Glucerna Shake - pt is refusing. Continue to encourage meal intake as able. RD to continue to follow nutrition care plan.  Nutrition Dx:   Inadequate oral intake related to poor appetite as evidenced by 15-20% meal completion and weight loss PTA. Resolved.  Goal:   Intake to meet >90% of estimated nutrition needs. Met.  Monitor:   PO intake, labs, weight trend.  Assessment:   Awaiting d/c to SNF vs CIR. Hypoglycemia resolved, lantus has been adjusted. Oral intake has improved, pt is consuming 50-75% of meals at this time.  Height: Ht Readings from Last 1 Encounters:  09/04/12 5' 1.5" (1.562 m)    Weight Status:   Wt Readings from Last 1 Encounters:  09/25/12 142 lb 13.7 oz (64.8 kg)  Admit wt 139 lb, stable  Re-estimated needs:  Kcal: 1400 - 1600 Protein: 75 - 90 Fluid: 1.4 -1.6 liters  Skin: stage 2 pressure ulcer on right heel  Diet Order: Carb Control Medium (1600-2000)   Intake/Output Summary (Last 24 hours) at 09/27/12 1129 Last data filed at 09/27/12 0846  Gross per 24 hour  Intake    180 ml  Output      0 ml  Net    180 ml    Last BM: 2/5   Labs:   Lab 09/27/12 0816 09/24/12 0520 09/23/12 0450  NA 137 137 139  K 3.7 3.7 3.8  CL 98 102 101  CO2 31 29 32  BUN 13 15 10   CREATININE 0.64 0.71 0.67  CALCIUM 9.5 8.7 9.1  MG -- -- --  PHOS -- -- --  GLUCOSE 163* 43* 132*    CBG (last 3)   Basename 09/27/12 0811 09/26/12 2340 09/26/12 1656  GLUCAP 153* 133* 126*    Scheduled Meds:   . amitriptyline  10 mg Oral QHS  . ciprofloxacin  400 mg Intravenous Q12H  . docusate sodium  100 mg Oral BID  . feeding supplement  237 mL Oral TID BM  . fludrocortisone  0.1 mg Oral QODAY  . insulin aspart  1-5 Units Subcutaneous TID WC & HS  . insulin glargine  6 Units Subcutaneous Daily  . levothyroxine  150 mcg Oral Custom  . levothyroxine  175 mcg Oral Custom  . mycophenolate  360 mg Oral BID  .  neomycin-bacitracin-polymyxin   Topical Daily  . ondansetron (ZOFRAN) IV  4 mg Intravenous Once  . pantoprazole  40 mg Oral Daily  . polyethylene glycol  17 g Oral Daily  . predniSONE  5 mg Oral q morning - 10a  . tacrolimus  1 mg Oral QHS  . tacrolimus  2 mg Oral Daily  . traMADol  50 mg Oral Q6H  . warfarin  5 mg Oral q1800  . Warfarin - Physician Dosing Inpatient   Does not apply q1800    Continuous Infusions: none  Jarold Motto MS, RD, LDN Pager: [phone] After-hours pager: [phone]

## 2012-09-27 NOTE — Progress Notes (Signed)
Talked with patient's husband about possible admission to CIR tomorrow. He is eager for pt to come to rehab. He reports that he can provide 24/7 care for pt after d/c and that their home is very accessible. Will f/u in AM. For questions, please call [phone].

## 2012-09-27 NOTE — Progress Notes (Signed)
Speech Language Pathology Treatment Patient Details Name: Elizabeth [name] MRN: [id number] DOB: August 14, 1941 Today's Date: 09/27/2012 Time: 4098-1191 SLP Time Calculation (min): 11 min  Assessment / Plan / Recommendation Clinical Impression  Pt seen for cognitive treatment - very sleepy today, and had difficulty sustaining attention in order to participate in therapy.  Pt oriented to situation and year, time of day.  Disoriented to place, month, and DOW.  Mod assist to sustain concentration and follow instructions.  Session discontinued - pt unable to stay awake.    SLP Plan  Continue with current plan of care    Pertinent Vitals/Pain No pain  SLP Goals  SLP Goals SLP Goal #1: Improve orientation skills to place, time and situation with moderate verbal and visual cues  SLP Goal #1 - Progress: Progressing toward goal SLP Goal #2: Complete target functional problem tasks with moderate to max verbal, visual and tactile cues.  SLP Goal #2 - Progress: Progressing toward goal SLP Goal #3: Improve intellectual awareness by stating current physical deficits with moderate verbal and tactile cues.  SLP Goal #3 - Progress: Progressing toward goal  General Temperature Spikes Noted: No Respiratory Status: Room air Behavior/Cognition: Lethargic  Oral Cavity - Oral Hygiene     Treatment Treatment focused on: Cognition Skilled Treatment: mod verbal cueing    Elizabeth [name] L. Elizabeth [name], [place] CCC/SLP Pager [phone]   Elizabeth [name] 09/27/2012, 1:49 PM

## 2012-09-28 ENCOUNTER — Encounter (HOSPITAL_COMMUNITY): Payer: Self-pay | Admitting: *Deleted

## 2012-09-28 ENCOUNTER — Inpatient Hospital Stay (HOSPITAL_COMMUNITY)
Admission: RE | Admit: 2012-09-28 | Discharge: 2012-10-12 | DRG: 945 | Disposition: A | Discharge status: Home Health | Payer: Medicare Other | Source: Intra-hospital | Attending: Physical Medicine & Rehabilitation | Admitting: Physical Medicine & Rehabilitation

## 2012-09-28 DIAGNOSIS — Z940 Kidney transplant status: Secondary | ICD-10-CM

## 2012-09-28 DIAGNOSIS — K589 Irritable bowel syndrome without diarrhea: Secondary | ICD-10-CM

## 2012-09-28 DIAGNOSIS — E876 Hypokalemia: Secondary | ICD-10-CM

## 2012-09-28 DIAGNOSIS — I2510 Atherosclerotic heart disease of native coronary artery without angina pectoris: Secondary | ICD-10-CM

## 2012-09-28 DIAGNOSIS — I951 Orthostatic hypotension: Secondary | ICD-10-CM

## 2012-09-28 DIAGNOSIS — I739 Peripheral vascular disease, unspecified: Secondary | ICD-10-CM

## 2012-09-28 DIAGNOSIS — F0390 Unspecified dementia without behavioral disturbance: Secondary | ICD-10-CM

## 2012-09-28 DIAGNOSIS — S069X9A Unspecified intracranial injury with loss of consciousness of unspecified duration, initial encounter: Secondary | ICD-10-CM

## 2012-09-28 DIAGNOSIS — Z823 Family history of stroke: Secondary | ICD-10-CM

## 2012-09-28 DIAGNOSIS — M810 Age-related osteoporosis without current pathological fracture: Secondary | ICD-10-CM

## 2012-09-28 DIAGNOSIS — E119 Type 2 diabetes mellitus without complications: Secondary | ICD-10-CM

## 2012-09-28 DIAGNOSIS — K5900 Constipation, unspecified: Secondary | ICD-10-CM | POA: Diagnosis present

## 2012-09-28 DIAGNOSIS — E1169 Type 2 diabetes mellitus with other specified complication: Secondary | ICD-10-CM

## 2012-09-28 DIAGNOSIS — F0781 Postconcussional syndrome: Secondary | ICD-10-CM

## 2012-09-28 DIAGNOSIS — E1165 Type 2 diabetes mellitus with hyperglycemia: Secondary | ICD-10-CM

## 2012-09-28 DIAGNOSIS — R42 Dizziness and giddiness: Secondary | ICD-10-CM

## 2012-09-28 DIAGNOSIS — Z951 Presence of aortocoronary bypass graft: Secondary | ICD-10-CM

## 2012-09-28 DIAGNOSIS — N390 Urinary tract infection, site not specified: Secondary | ICD-10-CM

## 2012-09-28 DIAGNOSIS — E1149 Type 2 diabetes mellitus with other diabetic neurological complication: Secondary | ICD-10-CM

## 2012-09-28 DIAGNOSIS — E039 Hypothyroidism, unspecified: Secondary | ICD-10-CM

## 2012-09-28 DIAGNOSIS — S88119A Complete traumatic amputation at level between knee and ankle, unspecified lower leg, initial encounter: Secondary | ICD-10-CM

## 2012-09-28 DIAGNOSIS — S069XAA Unspecified intracranial injury with loss of consciousness status unknown, initial encounter: Secondary | ICD-10-CM

## 2012-09-28 DIAGNOSIS — I4891 Unspecified atrial fibrillation: Secondary | ICD-10-CM

## 2012-09-28 DIAGNOSIS — S060X9A Concussion with loss of consciousness of unspecified duration, initial encounter: Secondary | ICD-10-CM

## 2012-09-28 DIAGNOSIS — W19XXXA Unspecified fall, initial encounter: Secondary | ICD-10-CM

## 2012-09-28 DIAGNOSIS — Y92009 Unspecified place in unspecified non-institutional (private) residence as the place of occurrence of the external cause: Secondary | ICD-10-CM

## 2012-09-28 DIAGNOSIS — G9340 Encephalopathy, unspecified: Secondary | ICD-10-CM

## 2012-09-28 DIAGNOSIS — Z8673 Personal history of transient ischemic attack (TIA), and cerebral infarction without residual deficits: Secondary | ICD-10-CM

## 2012-09-28 DIAGNOSIS — S02401A Maxillary fracture, unspecified, initial encounter for closed fracture: Secondary | ICD-10-CM

## 2012-09-28 DIAGNOSIS — E11319 Type 2 diabetes mellitus with unspecified diabetic retinopathy without macular edema: Secondary | ICD-10-CM

## 2012-09-28 DIAGNOSIS — Z5189 Encounter for other specified aftercare: Principal | ICD-10-CM

## 2012-09-28 DIAGNOSIS — E1122 Type 2 diabetes mellitus with diabetic chronic kidney disease: Secondary | ICD-10-CM | POA: Diagnosis present

## 2012-09-28 DIAGNOSIS — I639 Cerebral infarction, unspecified: Secondary | ICD-10-CM | POA: Diagnosis present

## 2012-09-28 DIAGNOSIS — K219 Gastro-esophageal reflux disease without esophagitis: Secondary | ICD-10-CM

## 2012-09-28 DIAGNOSIS — E785 Hyperlipidemia, unspecified: Secondary | ICD-10-CM

## 2012-09-28 DIAGNOSIS — Z8249 Family history of ischemic heart disease and other diseases of the circulatory system: Secondary | ICD-10-CM

## 2012-09-28 DIAGNOSIS — I10 Essential (primary) hypertension: Secondary | ICD-10-CM | POA: Diagnosis present

## 2012-09-28 DIAGNOSIS — E1139 Type 2 diabetes mellitus with other diabetic ophthalmic complication: Secondary | ICD-10-CM

## 2012-09-28 DIAGNOSIS — S02109A Fracture of base of skull, unspecified side, initial encounter for closed fracture: Secondary | ICD-10-CM

## 2012-09-28 DIAGNOSIS — E162 Hypoglycemia, unspecified: Secondary | ICD-10-CM | POA: Diagnosis present

## 2012-09-28 DIAGNOSIS — S0280XA Fracture of other specified skull and facial bones, unspecified side, initial encounter for closed fracture: Secondary | ICD-10-CM

## 2012-09-28 DIAGNOSIS — E1142 Type 2 diabetes mellitus with diabetic polyneuropathy: Secondary | ICD-10-CM

## 2012-09-28 DIAGNOSIS — IMO0001 Reserved for inherently not codable concepts without codable children: Secondary | ICD-10-CM

## 2012-09-28 DIAGNOSIS — B952 Enterococcus as the cause of diseases classified elsewhere: Secondary | ICD-10-CM

## 2012-09-28 LAB — GLUCOSE, CAPILLARY
Glucose-Capillary: 165 mg/dL — ABNORMAL HIGH (ref 70–99)
Glucose-Capillary: 151 mg/dL — ABNORMAL HIGH (ref 70–99)

## 2012-09-28 LAB — TACROLIMUS LEVEL: Tacrolimus (FK506) - LabCorp: 6.1 ng/mL

## 2012-09-28 LAB — PROTIME-INR: Prothrombin Time: 34.7 seconds — ABNORMAL HIGH (ref 11.6–15.2)

## 2012-09-28 MED ORDER — ACETAMINOPHEN 325 MG PO TABS
325.0000 mg | ORAL_TABLET | ORAL | Status: DC | PRN
  Administered 2012-10-01 – 2012-10-09 (×8): 650 mg via ORAL
  Filled 2012-09-28 (×8): qty 2

## 2012-09-28 MED ORDER — TRAMADOL HCL 50 MG PO TABS
25.0000 mg | ORAL_TABLET | Freq: Four times a day (QID) | ORAL | Status: DC | PRN
  Administered 2012-09-29 – 2012-10-07 (×10): 50 mg via ORAL
  Filled 2012-09-28 (×10): qty 1

## 2012-09-28 MED ORDER — TACROLIMUS 1 MG PO CAPS
1.0000 mg | ORAL_CAPSULE | Freq: Every day | ORAL | Status: DC
  Administered 2012-09-28 – 2012-10-11 (×13): 1 mg via ORAL
  Filled 2012-09-28 (×15): qty 1

## 2012-09-28 MED ORDER — INSULIN ASPART 100 UNIT/ML ~~LOC~~ SOLN
1.0000 [IU] | Freq: Three times a day (TID) | SUBCUTANEOUS | Status: DC
  Administered 2012-09-28: 1 [IU] via SUBCUTANEOUS
  Administered 2012-09-28: 2 [IU] via SUBCUTANEOUS
  Administered 2012-09-29: 3 [IU] via SUBCUTANEOUS
  Administered 2012-09-29: 2 [IU] via SUBCUTANEOUS
  Administered 2012-09-29: 1 [IU] via SUBCUTANEOUS
  Administered 2012-09-30: 2 [IU] via SUBCUTANEOUS
  Administered 2012-09-30: 1 [IU] via SUBCUTANEOUS
  Administered 2012-10-01: 2 [IU] via SUBCUTANEOUS
  Administered 2012-10-01: 3 [IU] via SUBCUTANEOUS
  Administered 2012-10-01: 4 [IU] via SUBCUTANEOUS
  Administered 2012-10-01: 2 [IU] via SUBCUTANEOUS
  Administered 2012-10-02: 3 [IU] via SUBCUTANEOUS
  Administered 2012-10-02 (×2): 2 [IU] via SUBCUTANEOUS
  Administered 2012-10-02 – 2012-10-03 (×2): 1 [IU] via SUBCUTANEOUS
  Administered 2012-10-05: 5 [IU] via SUBCUTANEOUS
  Administered 2012-10-05: 2 [IU] via SUBCUTANEOUS
  Administered 2012-10-06: 4 [IU] via SUBCUTANEOUS
  Administered 2012-10-06 (×2): 5 [IU] via SUBCUTANEOUS
  Administered 2012-10-07 (×3): 4 [IU] via SUBCUTANEOUS

## 2012-09-28 MED ORDER — SENNA 8.6 MG PO TABS
1.0000 | ORAL_TABLET | Freq: Two times a day (BID) | ORAL | Status: DC
  Administered 2012-09-28 – 2012-10-03 (×10): 8.6 mg via ORAL
  Filled 2012-09-28 (×15): qty 1

## 2012-09-28 MED ORDER — CEFUROXIME AXETIL 500 MG PO TABS
500.0000 mg | ORAL_TABLET | Freq: Two times a day (BID) | ORAL | Status: DC
  Administered 2012-09-28 – 2012-10-07 (×19): 500 mg via ORAL
  Filled 2012-09-28 (×22): qty 1

## 2012-09-28 MED ORDER — PREDNISONE 5 MG PO TABS
5.0000 mg | ORAL_TABLET | Freq: Every day | ORAL | Status: DC
  Administered 2012-09-29 – 2012-10-12 (×14): 5 mg via ORAL
  Filled 2012-09-28 (×16): qty 1

## 2012-09-28 MED ORDER — PANTOPRAZOLE SODIUM 40 MG PO TBEC
40.0000 mg | DELAYED_RELEASE_TABLET | Freq: Every day | ORAL | Status: DC
  Administered 2012-09-29 – 2012-10-12 (×14): 40 mg via ORAL
  Filled 2012-09-28 (×14): qty 1

## 2012-09-28 MED ORDER — TACROLIMUS 1 MG PO CAPS
2.0000 mg | ORAL_CAPSULE | Freq: Every day | ORAL | Status: DC
  Administered 2012-09-29 – 2012-10-12 (×14): 2 mg via ORAL
  Filled 2012-09-28 (×15): qty 2

## 2012-09-28 MED ORDER — ONDANSETRON HCL 4 MG PO TABS: 4.0000 mg | ORAL_TABLET | Freq: Four times a day (QID) | ORAL | Status: DC | PRN

## 2012-09-28 MED ORDER — POLYETHYLENE GLYCOL 3350 17 G PO PACK
17.0000 g | PACK | Freq: Every day | ORAL | Status: DC
  Administered 2012-09-30 – 2012-10-02 (×2): 17 g via ORAL
  Filled 2012-09-28 (×7): qty 1

## 2012-09-28 MED ORDER — MYCOPHENOLATE SODIUM 180 MG PO TBEC: 360.0000 mg | DELAYED_RELEASE_TABLET | Freq: Two times a day (BID) | ORAL | Status: DC

## 2012-09-28 MED ORDER — ONDANSETRON HCL 4 MG/2ML IJ SOLN
4.0000 mg | Freq: Four times a day (QID) | INTRAMUSCULAR | Status: DC | PRN
  Administered 2012-10-05 – 2012-10-06 (×2): 4 mg via INTRAVENOUS
  Filled 2012-09-28 (×2): qty 2

## 2012-09-28 MED ORDER — INSULIN GLARGINE 100 UNIT/ML ~~LOC~~ SOLN
6.0000 [IU] | Freq: Every day | SUBCUTANEOUS | Status: DC
  Administered 2012-09-29 – 2012-09-30 (×2): 6 [IU] via SUBCUTANEOUS

## 2012-09-28 MED ORDER — FLUDROCORTISONE ACETATE 0.1 MG PO TABS
0.1000 mg | ORAL_TABLET | ORAL | Status: DC
  Administered 2012-09-29 – 2012-10-11 (×5): 0.1 mg via ORAL
  Filled 2012-09-28 (×7): qty 1

## 2012-09-28 MED ORDER — LEVOTHYROXINE SODIUM 150 MCG PO TABS
150.0000 ug | ORAL_TABLET | ORAL | Status: DC
  Administered 2012-10-01 – 2012-10-12 (×10): 150 ug via ORAL
  Filled 2012-09-28 (×12): qty 1

## 2012-09-28 MED ORDER — WARFARIN - PHARMACIST DOSING INPATIENT: Freq: Every day | Status: DC

## 2012-09-28 MED ORDER — LEVOTHYROXINE SODIUM 175 MCG PO TABS
175.0000 ug | ORAL_TABLET | ORAL | Status: DC
  Administered 2012-09-29 – 2012-10-07 (×4): 175 ug via ORAL
  Filled 2012-09-28 (×5): qty 1

## 2012-09-28 MED ORDER — MYCOPHENOLATE SODIUM 180 MG PO TBEC
360.0000 mg | DELAYED_RELEASE_TABLET | Freq: Two times a day (BID) | ORAL | Status: DC
  Administered 2012-09-28 – 2012-10-12 (×27): 360 mg via ORAL
  Filled 2012-09-28 (×32): qty 2

## 2012-09-28 NOTE — Progress Notes (Signed)
Patient ID: MARCEDES TECH, female   DOB: October 05, 1940, 72 y.o.   MRN: [id number]   LOS: 8 days   Subjective: [place] that they wouldn't let husband see her yesterday. Says brace is on incorrectly. Otherwise ok.  Objective: Vital signs in last 24 hours: Temp:  [97.3 F (36.3 C)-98.4 F (36.9 C)] 98.4 F (36.9 C) (02/07 0515) Pulse Rate:  [65-73] 73  (02/07 0515) Resp:  [16-18] 16  (02/07 0515) BP: (123-144)/(45-74) 134/53 mmHg (02/07 0515) SpO2:  [94 %-98 %] 94 % (02/07 0515) Weight:  [142 lb 13.7 oz (64.8 kg)] 142 lb 13.7 oz (64.8 kg) (02/06 2100) Last BM Date: 09/27/12   Lab Results:  CBG (last 3)   Basename 09/27/12 2055 09/27/12 1710 09/27/12 1207  GLUCAP 125* 100* 275*   Lab Results  Component Value Date   INR 3.73* 09/28/2012   INR 2.01* 09/27/2012   INR 1.70* 09/26/2012    General appearance: alert and no distress Resp: clear to auscultation bilaterally Cardio: irregularly irregular rhythm GI: Soft, NT, diminished BS. Neuro: Oriented to person and place (DNT time)   Assessment/Plan: Fall  R orbit/maxilla/zygoma Fxs - no surgery needed per Dr. Chales Salmon  A fib - on Coumadin, stable rate. Supertherapeutic this morning, pharmacy to adjust Hypoglycemia - resolved, appreciate IM assist  Syncope W/U - outpatient Holter per IM rec  ID -- On cipro for UTI, culture pending VTE - Coumadin  FEN - No issues Dispo - Ok for d/c to CIR today    Freeman Caldron, PA-C Pager: [phone] General Trauma PA Pager: [phone]   09/28/2012

## 2012-09-28 NOTE — Consult Note (Signed)
[facility] CONSULT FOLLOW-UP NOTE  Elizabeth [name] YNW:[id number] DOB: [birth year] DOA: 09/20/2012 PCP: Irena Cords, MD Endocrinologist: Dr. Talmage Nap. Attending service: Trauma service Reason for consultation: Hypoglycemia  Impression/Recommendations: 1. Urinary tract infection: Followup culture. Empiric Cipro already started. 2. Hypoglycemia with diabetes mellitus: Resolved. Last episode 2/3. Dr. Sharl Ma discussed with the patient's endocrinologist. It was recommended that Lantus be decreased to 6 units daily and continue current sliding scale. 3. Subacute encephalopathy superimposed on chronic dementia: Improving with treatment of urinary tract infection. Most likely multifactorial on presentation hypoglycemia, opioids, underlying baseline dementia. Now most likely secondary to UTI. B12 and folate are normal, MRI is negative for CVA, Minimise opioids use. 4. Fall: Possibly secondary to severe hypoglycemia. 5. Syncope?: Consider outpatient monitoring. 6. Orthostatic hypotension: Continue fludrocortisone. 7. Chronic kidney disease status post transplant 2001: Continue prednisone, Prograf. 8. Hypothyroidism: TSH normal. 9. Atrial fibrillation: On warfarin. 10. Esophageal neurogenic pain: Resume continue Elavil at lower dose 11. History of stroke with short-term memory loss.   12. Facial fractures: No intervention per surgery. 13. 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  Appears medically stable. I see that transfer to [facility] is planned. Will sign off.  Family Communication:  None present   Brendia Sacks, MD  [facility] Team 6 Pager [phone] If 7PM-7AM, please contact night-coverage at [email or web address], password [facility] 09/28/2012, 12:34 PM  LOS: 8 days   Brief narrative: 72 year old female with a history of diabetes mellitus, orthostatic hypotension who has been on Florinef 0.1 mg every other day for past many months, history of CVA currently on  anticoagulation, end-stage renal disease, status post kidney transplant in December 2001, uropathy, CAD status post CABG, peripheral vascular disease status post left BKA, atrial fibrillation who was brought to the hospital after patient was found on the floor by patient's husband. EMS was called. Patient's blood glucose was found to be 47. Patient has been taking Lantus 10 units in the morning and has been taking NovoLog sliding scale before meals. As per husband he checked patient's blood sugar at lunch time yesterday and it was 147 for that he gave her 4 units of NovoLog. As per husband the patient was not able to get snacks after lunchtime so she might have dropped the blood sugars. This morning patient also was found to be 17, when patient came to the hospital she was started on NovoLog sliding scale every 4 hours and patient did receive insulin this morning around 4 AM when her blood sugar was 253. Patient has not been eating since she came to the hospital  Other consultants:  Physical therapy: Skilled nursing facility.  Speech therapy: Inpatient rehabilitation.  Physical medicine and rehabilitation: [facility]  HPI/Subjective: Feels okay. No complaints.  Objective: Filed Vitals:   09/27/12 2057 09/27/12 2100 09/28/12 0515 09/28/12 1000  BP: 144/74  134/53 138/62  Pulse: 72  73 72  Temp: 97.3 F (36.3 C)  98.4 F (36.9 C) 98.5 F (36.9 C)  TempSrc: Oral   Oral  Resp: 18  16 18   Height:  5' 1.5" (1.562 m)    Weight:  64.8 kg (142 lb 13.7 oz)    SpO2: 97%  94% 95%    Intake/Output Summary (Last 24 hours) at 09/28/12 1234 Last data filed at 09/28/12 0300  Gross per 24 hour  Intake    315 ml  Output      0 ml  Net    315 ml    Exam:  General:  Appears calm and  comfortable. Alert, oriented to hospital, month. Speech fluent and clear, appropriate. Cardiovascular: RRR, no m/r/g.  Respiratory: CTA bilaterally, no w/r/r. Normal respiratory effort.  Data Reviewed: Basic Metabolic  Panel:  Lab 09/27/12 0816 09/24/12 0520 09/23/12 0450  NA 137 137 139  K 3.7 3.7 3.8  CL 98 102 101  CO2 31 29 32  GLUCOSE 163* 43* 132*  BUN 13 15 10   CREATININE 0.64 0.71 0.67  CALCIUM 9.5 8.7 9.1  MG -- -- --  PHOS -- -- --   CBC:  Lab 09/27/12 0816 09/24/12 0520 09/23/12 0450  WBC 6.8 6.2 7.1  NEUTROABS -- -- --  HGB 10.9* 10.4* 11.1*  HCT 32.3* 32.0* 33.4*  MCV 89.2 90.9 89.8  PLT 158 135* 133*   CBG:  Lab 09/28/12 1210 09/28/12 0758 09/27/12 2055 09/27/12 1710 09/27/12 1207  GLUCAP 159* 165* 125* 100* 275*    Recent Results (from the past 240 hour(s))  MRSA PCR SCREENING     Status: Normal   Collection Time   09/20/12  9:57 PM      Component Value Range Status Comment   MRSA by PCR NEGATIVE  NEGATIVE Final   URINE CULTURE     Status: Normal   Collection Time   09/21/12  1:15 AM      Component Value Range Status Comment   Specimen Description URINE, CATHETERIZED   Final    Special Requests Normal   Final    Culture  Setup Time 09/21/2012 02:11   Final    Colony Count NO GROWTH   Final    Culture NO GROWTH   Final    Report Status 09/22/2012 FINAL   Final   URINE CULTURE     Status: Normal (Preliminary result)   Collection Time   09/27/12  9:54 AM      Component Value Range Status Comment   Specimen Description URINE, CATHETERIZED   Final    Special Requests NONE   Final    Culture  Setup Time 09/27/2012 10:50   Final    Colony Count >=100,000 COLONIES/ML   Final    Culture ESCHERICHIA COLI   Final    Report Status PENDING   Incomplete      Studies:   Scheduled Meds:    . amitriptyline  10 mg Oral QHS  . ciprofloxacin  400 mg Intravenous Q12H  . docusate sodium  100 mg Oral BID  . fludrocortisone  0.1 mg Oral QODAY  . insulin aspart  1-5 Units Subcutaneous TID WC & HS  . insulin glargine  6 Units Subcutaneous Daily  . levothyroxine  150 mcg Oral Custom  . levothyroxine  175 mcg Oral Custom  . mycophenolate  360 mg Oral BID  .  neomycin-bacitracin-polymyxin   Topical Daily  . ondansetron (ZOFRAN) IV  4 mg Intravenous Once  . pantoprazole  40 mg Oral Daily  . polyethylene glycol  17 g Oral Daily  . predniSONE  5 mg Oral q morning - 10a  . tacrolimus  1 mg Oral QHS  . tacrolimus  2 mg Oral Daily  . traMADol  50 mg Oral Q6H  . warfarin  5 mg Oral q1800  . Warfarin - Physician Dosing Inpatient   Does not apply q1800   Continuous Infusions:    Active Problems:  DIABETES MELLITUS, TYPE II  Fall  Right orbit fracture  Zygoma fracture  Fracture of right maxilla  Concussion, unspecified  Hypoglycemia  Encephalopathy  UTI (lower urinary  tract infection)    Time Spent: 10 minutes  Brendia Sacks, MD  [facility] Team 6 Pager [phone] If 7PM-7AM, please contact night-coverage at [email or web address], password [facility] 09/28/2012, 12:34 PM  LOS: 8 days

## 2012-09-28 NOTE — H&P (Signed)
Physical Medicine and Rehabilitation Admission H&P  Chief Complaint   Patient presents with   .  Fall   :  HPI: Elizabeth [name] is a 72 y.o. 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 Admitted 09/20/2012 after a fall that was unwitnessed with noted altered mental status. MRI of the brain with no acute intracranial abnormalities noting areas of remote hemorrhagic encephalomalacia in the right occipital lobe and inferior left cerebellum. CT chest and abdomen negative. CT maxillofacial with fractures through the anterior and lateral walls of the right maxillary sinus and 2 fractures within the mid posterior right zygomatic arch and right orbital wall and advise conservative care per ENT Dr. Chales Salmon. 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 Patient with history of orthostatic hypotension and patient remains on Florinef as prior to hospital admission. Patient remains on chronic Coumadin therapy for atrial fibrillation with no chest pain or shortness of breath. Physical therapy evaluation completed 09/22/2012 limited by cognition. M.D. is requested physical medicine rehabilitation consult to consider inpatient rehabilitation services. Patient was felt to be a candidate for inpatient rehabilitation services and was admitted for a comprehensive rehabilitation program  Review of Systems  Cardiovascular: Positive for palpitations.  Musculoskeletal: Positive for myalgias and falls.  Neurological: Positive for dizziness. Blurred vision  All other  systems reviewed and are negative  Past Medical History   Diagnosis  Date   .  Coronary artery disease    .  Chronic kidney disease      s/p cadaveric renal transplant in 2001   .  Diabetes mellitus    .  PVD (peripheral vascular disease)    .  History of orthostatic hypotension    .  Hyperlipidemia    .  Hypothyroidism    .  Anemia    .  Pancreatic cyst    .  Osteoporosis      recurrent fractures   .  Melanoma    .  Carotid bruit      left   .  Adenomatous polyp    .  GERD (gastroesophageal reflux disease)    .  Gastritis    .  CVA (cerebral infarction)    .  Bradycardia    .  Renal artery stenosis    .  Diabetic retinopathy(362.0)    .  Renal osteodystrophy    .  Hyperparathyroidism    .  Leg ulcer      from poor fitting prosthetic   .  H/O immunosuppressive therapy    .  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  Fx wrist    .  Fx ankle    .  Neuropathy    .  Nephropathy    .  Carotid artery stenosis      mild   .  Stroke    .  Tubular adenoma    .  Colitis    .  IBS (irritable bowel syndrome)    .  Ischemic colitis    .  Arteriosclerosis, mesenteric artery  02/27/2012     Duplex US shows >70% stenosis celiac and  signs of stenosis of SMA and IMA   .  Esophageal candidiasis    .  Neurogenic pain-esophagus  09/04/2012    Past Surgical History   Procedure  Date   .  Kidney transplant  08/15/2000     [facility] in [place]   .  Below knee leg amputation      left, charcott joint from neuropathy   .  Appendectomy    .  Cataract extraction    .  Cholecystectomy    .  Abdominal hysterectomy    .  Thyroid surgery    .  Tonsillectomy    .  Cesarean section      x 2   .  Vitrectomy      right eye   .  Coronary artery bypass graft    .  Colonoscopy w/ biopsies and polypectomy  02/18/2009     adenomatous polyps, diverticulosis, internal hemorrhoids   .  Eus  02/04/2010     w/FNA, pancreatic cyst   .  Upper gastrointestinal endoscopy  12/01/2009    .  Colonoscopy  01/30/2012     Procedure: COLONOSCOPY; Surgeon: Rachael Fee, MD; Location: [facility] ENDOSCOPY; Service: Endoscopy; Laterality: N/A;   .  Esophagogastroduodenoscopy  05/02/2012     Procedure: ESOPHAGOGASTRODUODENOSCOPY (EGD); Surgeon: Iva Boop, MD; Location: [facility] ENDOSCOPY; Service: Endoscopy; Laterality: N/A;    Family History   Problem  Relation  Age of Onset   .  Stroke  Mother  54   .  Heart attack  Father  23   .  Colon cancer  Neg Hx    .  Diabetes  Neg Hx     Social History: reports that she has never smoked. She has never used smokeless tobacco. She reports that she does not drink alcohol or use illicit drugs.  Allergies:  Allergies   Allergen  Reactions   .  Percocet (Oxycodone-Acetaminophen)  Nausea And Vomiting    Medications Prior to Admission   Medication  Sig  Dispense  Refill   .  amitriptyline (ELAVIL) 25 MG tablet  Take 1 tablet (25 mg total) by mouth at bedtime.  90 tablet  3   .  atorvastatin (LIPITOR) 10 MG tablet  Take 10 mg by mouth every other day.     .  Calcium Carbonate-Vitamin D (CALCIUM 600-D) 600-400 MG-UNIT per tablet  Take 1 tablet by mouth daily.     .  Cholecalciferol (VITAMIN D3 SUPER STRENGTH) 2000 UNITS TABS  Take 1 tablet by mouth daily at 12 noon.     .  Cholecalciferol (VITAMIN D3) 400 UNITS CAPS  Take 800 Units by mouth at bedtime.     .  fludrocortisone (FLORINEF) 0.1 MG tablet  Take 0.1 mg by mouth every other day.     .  hyoscyamine (LEVSIN SL) 0.125 MG SL tablet  Place 0.125 mg under the tongue daily as needed. For IBS     .  insulin glargine (LANTUS) 100 UNIT/ML injection  Inject 10 Units into the skin every morning.     .  insulin lispro (HUMALOG) 100 UNIT/ML injection  Inject 2-9 Units into the skin 4 (four) times daily - before meals and at bedtime. Per sliding scale.     .  levothyroxine (SYNTHROID, LEVOTHROID) 150 MCG tablet  Take 150 mcg by mouth See admin instructions. Take 1 tablet daily Monday through Friday     .   levothyroxine (SYNTHROID, LEVOTHROID) 175 MCG tablet  Take 175 mcg by mouth See admin instructions. 1 tablet daily on Saturday and Sunday     .  Magnesium 250 MG TABS  Take 250 mg by mouth daily.     .  Multiple Vitamin (MULTIVITAMIN WITH MINERALS) TABS  Take 1 tablet by mouth daily.     .  mycophenolate (MYFORTIC) 360 MG TBEC  Take 360 mg by mouth 2 (two) times daily.     Marland Kitchen  omeprazole (PRILOSEC) 20 MG capsule  Take 20 mg by mouth every morning.     .  predniSONE (DELTASONE) 5 MG tablet  Take 5 mg by mouth every morning.     .  tacrolimus (PROGRAF) 0.5 MG capsule  Take 1-2 mg by mouth 2 (two) times daily. Take 2mg  in the am & 1mg  at night     .  warfarin (COUMADIN) 2.5 MG tablet  Take 5 mg by mouth every evening. Take as directed by anticoagulation clinic      Home:  Home Living  Lives With: Spouse  Available Help at Discharge: Family;Available 24 hours/day  Type of Home: House  Home Access: (Husband able to get pt in PTA)  Home Layout: One level  Bathroom Shower/Tub: Pension scheme manager: Standard  Bathroom Accessibility: Yes  How Accessible: Accessible via walker  Home Adaptive Equipment: Grab bars in shower;Grab bars around toilet;Bedside commode/3-in-1;Shower chair with back;Walker - rolling;Wheelchair - manual  Additional Comments: Chart reports that pt had all equipment for husband to care for her PTA. He plans to care for her x 24 hours/day. He is not present at this time.He only would leave her 1 hour at a time per chart. She recently had been more demented and hallucitnating.  Functional History:  Prior Function  Bath: Moderate  Toileting: Minimal  Dressing: Moderate  Meal Prep: Total  Light Housekeeping: Total  Able to Take Stairs?: Yes (with assistance)  Driving: No  Vocation: Retired  Functional Status:  Mobility:  Bed Mobility  Bed Mobility: Rolling Right;Right Sidelying to Sit;Sitting - Scoot to [place] of Bed  Rolling Right: 4: Min assist  Rolling Right:  Patient Percentage: 30%  Right Sidelying to Sit: 3: Mod assist  Right Sidelying to Sit: Patient Percentage: 40%  Sitting - Scoot to Edge of Bed: 3: Mod assist  Sitting - Scoot to Edge of Bed: Patient Percentage: 50%  Transfers  Transfers: Sit to Stand;Stand to [employer] Transfers  Sit to Stand: 3: Mod assist;From bed;With upper extremity assist  Sit to Stand: Patient Percentage: 50%  Stand to Sit: 3: Mod assist;To bed;To chair/3-in-1  Stand to Sit: Patient Percentage: 50%  Stand Pivot Transfers: 3: Mod assist  Stand Pivot Transfers: Patient Percentage: 60%  Ambulation/Gait  Ambulation/Gait Assistance: 1: +2 Total assist  Ambulation/Gait: Patient Percentage: 80%  Ambulation Distance (Feet): 8 Feet  Assistive device: 2 person hand held assist  Ambulation/Gait Assistance Details: Attempted ambulation with RW; un successful; Pt performed better ambulation with hand held assist and VCs for initiation of step  Gait Pattern: Step-to pattern;Decreased stride length;Trunk flexed  Gait velocity: decreased  Stairs: No  Wheelchair Mobility  Wheelchair Mobility: No  ADL:  ADL  Eating/Feeding: Modified independent  Where Assessed - Eating/Feeding: Chair  Grooming: Minimal assistance  Where Assessed - Grooming: Supported sitting  Upper Body Bathing: Minimal assistance  Where Assessed - Upper Body Bathing: Supported sitting  Lower Body Bathing: Maximal assistance  Where Assessed - Lower Body Bathing: Supported sit to stand  Upper Body Dressing:  Minimal assistance  Where Assessed - Upper Body Dressing: Supported sitting  Lower Body Dressing: +1 Total assistance  Where Assessed - Lower Body Dressing: Supported sit to Scientist, research (life sciences): Moderate assistance  Toilet Transfer Method: Stand pivot  Toilet Transfer Equipment: Bedside commode  Equipment Used: Other (comment)  Transfers/Ambulation Related to ADLs: Mod A std pivot to L side. Pt able to bear weight through BLE and assist with  stepping during transfer  ADL Comments: Called back to room to assist with completing toileting. Pt with ability fto assist with hygien after toileitn by independently positioning self on [facility] for hygiene. Pt transffered to weaker side back to recliner. Able to stand step using therapist for support. Good paritcipation.  Cognition:  Cognition  Overall Cognitive Status: Impaired  Arousal/Alertness: Lethargic  Orientation Level: Oriented to person;Oriented to place;Oriented to time  Attention: Focused  Focused Attention: Impaired  Focused Attention Impairment: Verbal basic;Functional basic  Memory: Impaired  Memory Impairment: Storage deficit;Retrieval deficit;Decreased recall of new information;Decreased long term memory  Decreased Long Term Memory: Verbal basic;Functional basic  Awareness: Impaired  Awareness Impairment: Intellectual impairment;Emergent impairment;Anticipatory impairment  Problem Solving: Impaired  Problem Solving Impairment: Verbal basic;Functional basic  Executive Function: Reasoning;Initiating  Reasoning: Impaired  Reasoning Impairment: Verbal basic;Functional basic  Initiating: Impaired  Initiating Impairment: Verbal basic;Functional basic  Behaviors: Confabulation  Safety/Judgment: Impaired  Cognition  Overall Cognitive Status: History of cognitive impairments - at baseline  Area of Impairment: Attention;Memory;Problem solving  Arousal/Alertness: Lethargic  Orientation Level: Disoriented to;Situation;Place;Time  Behavior During Session: WFL for tasks performed  Current Attention Level: Selective  Attention - Other Comments: seconds only  Memory: Decreased recall of precautions  Memory Deficits: history of memory deficits from previous CVA  Following Commands: Follows one step commands consistently  Safety/Judgement: Other (comment) (good awareness of safety this session)  Safety/Judgement - Other Comments: poor safety and poor judgment.  Awareness of  Deficits: Pt with poor quality of movement  Problem Solving: Min A funcitonal basic. slow processing  Cognition - Other Comments: Pt very disoriented with max cues for attention to task    Blood pressure 134/53, pulse 73, temperature 98.4 F (36.9 C), temperature source Oral, resp. rate 16, height 5' 1.5" (1.562 m), weight 64.8 kg (142 lb 13.7 oz), SpO2 94.00%.  Physical Exam  Vitals reviewed.  HENT:  Multiple bruising and bruising to facial area  Eyes: EOM are normal.  Neck: Normal range of motion. Neck supple. No thyromegaly present.  Cardiovascular:  Cardiac rate control  Pulmonary/Chest: Effort normal and breath sounds normal. No respiratory distress.  Abdominal: Soft. Bowel sounds are normal. She exhibits no distension.  Neurological: She is alert.  Patient appropriate for age and place. She needed some cues for day of the week. She was a fair medical historian. Follow simple commands. Easily distracted. Sometimes difficult to keep on task during exam. Occasionally irritable.  Motor strength is 4/5 in bilateral biceps, triceps, wrist flexors and extension and grip as well as hip flexors knee extensors ankle dorsiflexors and plantar flexors  Sensory exam is normal in the upper extremities but appears diminished in the distal right lowe limb. Has difficulty with left finger nose to finger  Left field cut on confrontation testing. Gaze conjugate. Wearing AFO right lower ext with movement noted within brace. Skin: multiple bruises and contusions noted throughout. Left BKA stump intact.     Results for orders placed during the hospital encounter of 09/20/12 (from the past 48 hour(s))   GLUCOSE, CAPILLARY  Status: Abnormal    Collection Time    09/26/12 7:52 AM   Component  Value  Range  Comment    Glucose-Capillary  128 (*)  70 - 99 mg/dL     Comment 1  Notify RN     GLUCOSE, CAPILLARY Status: Abnormal    Collection Time    09/26/12 12:00 PM   Component  Value  Range  Comment     Glucose-Capillary  226 (*)  70 - 99 mg/dL     Comment 1  Notify RN     GLUCOSE, CAPILLARY Status: Abnormal    Collection Time    09/26/12 4:56 PM   Component  Value  Range  Comment    Glucose-Capillary  126 (*)  70 - 99 mg/dL     Comment 1  Notify RN     GLUCOSE, CAPILLARY Status: Abnormal    Collection Time    09/26/12 11:40 PM   Component  Value  Range  Comment    Glucose-Capillary  133 (*)  70 - 99 mg/dL    PROTIME-INR Status: Abnormal    Collection Time    09/27/12 4:50 AM   Component  Value  Range  Comment    Prothrombin Time  22.0 (*)  11.6 - 15.2 seconds     INR  2.01 (*)  0.00 - 1.49    GLUCOSE, CAPILLARY Status: Abnormal    Collection Time    09/27/12 8:11 AM   Component  Value  Range  Comment    Glucose-Capillary  153 (*)  70 - 99 mg/dL    CBC Status: Abnormal    Collection Time    09/27/12 8:16 AM   Component  Value  Range  Comment    WBC  6.8  4.0 - 10.5 K/uL     RBC  3.62 (*)  3.87 - 5.11 MIL/uL     Hemoglobin  10.9 (*)  12.0 - 15.0 g/dL     HCT  16.1 (*)  09.6 - 46.0 %     MCV  89.2  78.0 - 100.0 fL     MCH  30.1  26.0 - 34.0 pg     MCHC  33.7  30.0 - 36.0 g/dL     RDW  04.5  40.9 - 81.1 %     Platelets  158  150 - 400 K/uL    BASIC METABOLIC PANEL Status: Abnormal    Collection Time    09/27/12 8:16 AM   Component  Value  Range  Comment    Sodium  137  135 - 145 mEq/L     Potassium  3.7  3.5 - 5.1 mEq/L     Chloride  98  96 - 112 mEq/L     CO2  31  19 - 32 mEq/L     Glucose, Bld  163 (*)  70 - 99 mg/dL     BUN  13  6 - 23 mg/dL     Creatinine, Ser  9.14  0.50 - 1.10 mg/dL     Calcium  9.5  8.4 - 10.5 mg/dL     GFR calc non Af Amer  86 (*)  >90 mL/min     GFR calc Af Amer  >90  >90 mL/min    URINALYSIS, ROUTINE W REFLEX MICROSCOPIC Status: Abnormal    Collection Time    09/27/12 9:54 AM   Component  Value  Range  Comment    Color, Urine  YELLOW  YELLOW     APPearance  TURBID (*)  CLEAR     Specific Gravity, Urine  1.013  1.005 - 1.030     pH  6.0  5.0 - 8.0      Glucose, UA  NEGATIVE  NEGATIVE mg/dL     Hgb urine dipstick  LARGE (*)  NEGATIVE     Bilirubin Urine  NEGATIVE  NEGATIVE     Ketones, ur  NEGATIVE  NEGATIVE mg/dL     Protein, ur  161 (*)  NEGATIVE mg/dL     Urobilinogen, UA  0.2  0.0 - 1.0 mg/dL     Nitrite  NEGATIVE  NEGATIVE     Leukocytes, UA  LARGE (*)  NEGATIVE    URINE MICROSCOPIC-ADD ON Status: Abnormal    Collection Time    09/27/12 9:54 AM   Component  Value  Range  Comment    Squamous Epithelial / LPF  RARE  RARE     WBC, UA  TOO NUMEROUS TO COUNT  <3 WBC/hpf     RBC / HPF  11-20  <3 RBC/hpf     Bacteria, UA  MANY (*)  RARE     Urine-Other  LESS THAN 10 mL OF URINE SUBMITTED     GLUCOSE, CAPILLARY Status: Abnormal    Collection Time    09/27/12 12:07 PM   Component  Value  Range  Comment    Glucose-Capillary  275 (*)  70 - 99 mg/dL    GLUCOSE, CAPILLARY Status: Abnormal    Collection Time    09/27/12 5:10 PM   Component  Value  Range  Comment    Glucose-Capillary  100 (*)  70 - 99 mg/dL    GLUCOSE, CAPILLARY Status: Abnormal    Collection Time    09/27/12 8:55 PM   Component  Value  Range  Comment    Glucose-Capillary  125 (*)  70 - 99 mg/dL     Comment 1  Notify RN      No results found.  Post Admission Physician Evaluation:  1. Functional deficits secondary to traumatic brain injury/polytrauma. (hx of left BKA) 2. Patient is admitted to receive collaborative, interdisciplinary care between the physiatrist, rehab nursing staff, and therapy team. 3. Patient's level of medical complexity and substantial therapy needs in context of that medical necessity cannot be provided at a lesser intensity of care such as a SNF. 4. Patient has experienced substantial functional loss from his/her baseline which was documented above under the "Functional History" and "Functional Status" headings. Judging by the patient's diagnosis, physical exam, and functional history, the patient has potential for functional progress which will result  in measurable gains while on inpatient rehab. These gains will be of substantial and practical use upon discharge in facilitating mobility and self-care at the household level. 5. Physiatrist will provide 24 hour management of medical needs as well as oversight of the therapy plan/treatment and provide guidance as appropriate regarding the interaction of the two. 6. 24 hour rehab nursing will assist with bladder management, bowel management, safety, skin/wound care, disease management, medication administration, pain management and patient education and help integrate therapy concepts, techniques,education, etc. 7. PT will assess and treat for: Lower extremity strength, range of motion, stamina, balance, functional mobility, safety, adaptive techniques and equipment, NMR, prosthetic and orthotic assessment, pain mgt. Goals are: min to mod assist. 8. OT will assess and treat for: ADL's, functional mobility, safety, upper extremity strength, adaptive techniques and equipment, NMR, orthotic use,  pain mgt. Goals are: min to mod assist. 9. SLP will assess and treat for: cognition and communication. Goals are: supervision. 10. Case Management and Social Worker will assess and treat for psychological issues and discharge planning. 11. Team conference will be held weekly to assess progress toward goals and to determine barriers to discharge. 12. Patient will receive at least 3 hours of therapy per day at least 5 days per week. 13. ELOS: 2-3 weeks Prognosis: good   Medical Problem List and Plan:  1. 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 DVT Prophylaxis/Anticoagulation: Chronic Coumadin for a true fibrillation. Monitor for any bleeding episodes  3. Pain Management: Ultram as needed. Monitor with increased activity  4. Mood/underlying dementia. Will discuss baseline with husband. Cognition likely a major factor in her falls and gait disorder.  5. Neuropsych: This patient is  not capable of making decisions on his/her own behalf.  6. Multiple facial fractures. Conservative care per ENT  7. Chronic orthostatic hypotension. Continue Florinef as directed  8. History of CVA. Husband notes premorbid chronic visual problems related to stroke 2012  9. Renal transplant 2001. Prograf/prednisone  10. 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 Will modify the data as needed  11. Diabetes mellitus with peripheral neuropathy. Hemoglobin A1c 6.3. Lantus insulin 6 units daily. Check blood sugars a.c. and at bedtime  12. Coronary artery disease/CABG. No chest pain or shortness of breath   Ranelle Oyster, MD, [place] [facility]  09/28/2012

## 2012-09-28 NOTE — Discharge Summary (Signed)
Elizabeth Schreur, MD, MPH, FACS Pager: [phone]  

## 2012-09-28 NOTE — Progress Notes (Signed)
Admitting patient to CIR today. Patient and her spouse are in agreement. MD reports pt is stable for d/c to CIR. For questions, please call [phone]

## 2012-09-28 NOTE — Discharge Summary (Signed)
Physician Discharge Summary  Patient ID: Elizabeth [name] MRN: [id number] DOB/AGE: 04/06/1941 72 y.o.  Admit date: 09/20/2012 Discharge date: 09/28/2012  Discharge Diagnoses Patient Active Problem List   Diagnosis Date Noted  . UTI (lower urinary tract infection) 09/27/2012  . Hypoglycemia 09/26/2012  . Encephalopathy 09/26/2012  . Fall 09/21/2012  . Right orbit fracture 09/21/2012  . Zygoma fracture 09/21/2012  . Fracture of right maxilla 09/21/2012  . Concussion, unspecified 09/21/2012  . 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 Orthostatic hypotension 06/06/2012  . 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 Constipation 10/11/2011  . Nocturnal polyuria 03/31/2011  . Atrial fibrillation 01/28/2011  . Encounter for long-term (current) use of anticoagulants 01/28/2011  . CVA (cerebral vascular accident) 01/28/2011  . DVT of upper extremity (deep vein thrombosis) 01/28/2011  . Edema 01/10/2011  . SINUS BRADYCARDIA 12/14/2009  . GERD 12/08/2009  . DIZZINESS 05/20/2009  . DIABETES MELLITUS, TYPE II 02/11/2009  . HYPERLIPIDEMIA-MIXED 02/09/2009  . HYPERTENSION, BENIGN 02/09/2009  . CAD, NATIVE VESSEL 02/09/2009  . CAROTID BRUITS, BILATERAL 02/09/2009  . 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 Irritable bowel syndrome 08/18/2008  . COLONIC POLYPS, HX OF 08/18/2008    Consultants Dr. Amanda Pea for internal medicine  Dr. Dutch Quint for facial surgery  Dr. Claudette Laws for PM&R   Procedures None   HPI: Elizabeth [name], who ambulates with a walker, presented after a fall. This was not witnessed. It is uncertain how long she was down. It is unknown if there was a loss of consciousness. It was presumed that she hit her walker on her abdomen when she fell. EMS found her to be hypoglycemic. Her workup included CT scans of the head, cervical spine, chest, abdomen, and pelvis and showed the facial fractures only. The  head CT was negative.   Hospital Course: Later on the day of admission she was found to be obtunded. It was assumed this was due to medication but given her anticoagulated status another head CT was performed which was also negative. Facial surgery was consulted and determined non-operative treatment was appropriate for her facial fractures. Internal medicine was consulted to manage her medical problems, including her hypoglycemia. Multiple adjustments were made to her medication regimen and her medical problems, including her diabetes, seemed to be under good control at the time of discharge. They recommended outpatient Holter monitoring given the unknown nature of her fall. Therapies were ordered and she made slow progress with them. They recommended inpatient rehabilitation and they were consulted. They agreed with admission but were delayed because of bed availability. Toward the end of her hospital stay she became agitated and confused. She was diagnosed with a UTI and started on empiric antibiotics. Her confusion cleared quickly. She was discharged to inpatient rehabilitation in stable condition.   Inpatient Medications Scheduled Meds:   . amitriptyline  10 mg Oral QHS  . ciprofloxacin  400 mg Intravenous Q12H  . docusate sodium  100 mg Oral BID  . fludrocortisone  0.1 mg Oral QODAY  . insulin aspart  1-5 Units Subcutaneous TID WC & HS  . insulin glargine  6 Units Subcutaneous Daily  . levothyroxine  150 mcg Oral Custom  . levothyroxine  175 mcg Oral Custom  . mycophenolate  360 mg Oral BID  . neomycin-bacitracin-polymyxin   Topical Daily  . ondansetron (ZOFRAN) IV  4 mg Intravenous Once  . pantoprazole  40 mg Oral Daily  . polyethylene glycol  17 g Oral Daily  .  predniSONE  5 mg Oral q morning - 10a  . tacrolimus  1 mg Oral QHS  . tacrolimus  2 mg Oral Daily  . traMADol  50 mg Oral Q6H  . warfarin  5 mg Oral q1800  . Warfarin - Physician Dosing Inpatient   Does not apply q1800    Continuous Infusions:  PRN Meds:.acetaminophen, haloperidol lactate, hydrALAZINE, ondansetron (ZOFRAN) IV, ondansetron, traMADol   Home medications   Medication List     As of 09/28/2012  8:13 AM    STOP taking these medications         insulin glargine 100 UNIT/ML injection   Commonly known as: LANTUS      TAKE these medications         amitriptyline 25 MG tablet   Commonly known as: ELAVIL   Take 1 tablet (25 mg total) by mouth at bedtime.      atorvastatin 10 MG tablet   Commonly known as: LIPITOR   Take 10 mg by mouth every other day.      CALCIUM 600-D 600-400 MG-UNIT per tablet   Generic drug: Calcium Carbonate-Vitamin D   Take 1 tablet by mouth daily.      COUMADIN 2.5 MG tablet   Generic drug: warfarin   Take 5 mg by mouth every evening. Take as directed by anticoagulation clinic      fludrocortisone 0.1 MG tablet   Commonly known as: FLORINEF   Take 0.1 mg by mouth every other day.      hyoscyamine 0.125 MG SL tablet   Commonly known as: LEVSIN SL   Place 0.125 mg under the tongue daily as needed. For IBS      insulin lispro 100 UNIT/ML injection   Commonly known as: HUMALOG   Inject 2-9 Units into the skin 4 (four) times daily -  before meals and at bedtime. Per sliding scale.      levothyroxine 175 MCG tablet   Commonly known as: SYNTHROID, LEVOTHROID   Take 175 mcg by mouth See admin instructions. 1 tablet daily on Saturday and Sunday      levothyroxine 150 MCG tablet   Commonly known as: SYNTHROID, LEVOTHROID   Take 150 mcg by mouth See admin instructions. Take 1 tablet daily Monday through Friday      Magnesium 250 MG Tabs   Take 250 mg by mouth daily.      multivitamin with minerals Tabs   Take 1 tablet by mouth daily.      mycophenolate 360 MG Tbec   Commonly known as: MYFORTIC   Take 360 mg by mouth 2 (two) times daily.      omeprazole 20 MG capsule   Commonly known as: PRILOSEC   Take 20 mg by mouth every morning.      predniSONE  5 MG tablet   Commonly known as: DELTASONE   Take 5 mg by mouth every morning.      tacrolimus 0.5 MG capsule   Commonly known as: PROGRAF   Take 1-2 mg by mouth 2 (two) times daily. Take 2mg  in the am & 1mg  at night      VITAMIN D3 SUPER STRENGTH 2000 UNITS Tabs   Generic drug: Cholecalciferol   Take 1 tablet by mouth daily at 12 noon.      Vitamin D3 400 UNITS Caps   Take 800 Units by mouth at bedtime.        Discharge planning took greater than 30 minutes.   Signed: Casimiro Needle  Gerrianne Scale, PA-C Pager: [phone] General Trauma PA Pager: [phone]  09/28/2012, 8:13 AM

## 2012-09-28 NOTE — Progress Notes (Signed)
Arrived on rehab 1525 on hospital bed. Patient quiet flat. Husband at bedside.

## 2012-09-28 NOTE — Progress Notes (Signed)
Patient transferred to Inpatient rehab.  Husband aware of transport and at bedside.  Report called to RN on [phone].  Vital signs stable, no complaints of pain.  Transferred per bed.

## 2012-09-28 NOTE — Progress Notes (Signed)
ANTICOAGULATION CONSULT NOTE - Initial Consult  Pharmacy Consult for Coumadin Indication: atrial fibrillation, hx DVT, hx stroke  Allergies  Allergen Reactions  . Percocet (Oxycodone-Acetaminophen) Nausea And Vomiting    Patient Measurements: Height: 5' 1.5" (156.2 cm) Weight: 142 lb 13.7 oz (64.799 kg) IBW/kg (Calculated) : 48.95    Vital Signs: Temp: 97.7 F (36.5 C) (02/07 1554) Temp src: Oral (02/07 1554) BP: 162/76 mmHg (02/07 1554) Pulse Rate: 65  (02/07 1554)  Labs:  Basename 09/28/12 0555 09/27/12 0816 09/27/12 0450 09/26/12 0550  HGB -- 10.9* -- --  HCT -- 32.3* -- --  PLT -- 158 -- --  APTT -- -- -- --  LABPROT 34.7* -- 22.0* 19.4*  INR 3.73* -- 2.01* 1.70*  HEPARINUNFRC -- -- -- --  CREATININE -- 0.64 -- --  CKTOTAL -- -- -- --  CKMB -- -- -- --  TROPONINI -- -- -- --    Estimated Creatinine Clearance: 54.7 ml/min (by C-G formula based on Cr of 0.64).   Medical History: Past Medical History  Diagnosis Date  . Coronary artery disease   . Chronic kidney disease     s/p cadaveric renal transplant in 2001  . Diabetes mellitus   . PVD (peripheral vascular disease)   . History of orthostatic hypotension   . Hyperlipidemia   . Hypothyroidism   . Anemia   . Pancreatic cyst   . Osteoporosis     recurrent fractures  . Melanoma   . Carotid bruit     left  . Adenomatous polyp   . GERD (gastroesophageal reflux disease)   . Gastritis   . CVA (cerebral infarction)   . Bradycardia   . Renal artery stenosis   . Diabetic retinopathy(362.0)   . Renal osteodystrophy   . Hyperparathyroidism   . Leg ulcer     from poor fitting prosthetic  . H/O immunosuppressive therapy   . 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 Fx wrist   . Fx ankle   . Neuropathy   . Nephropathy   . Carotid artery stenosis     mild  . Stroke   . Tubular adenoma   . Colitis   . IBS (irritable bowel syndrome)   . Ischemic colitis   . Arteriosclerosis, mesenteric  artery 02/27/2012    Duplex US shows >70% stenosis celiac and signs of stenosis of SMA and IMA   . Esophageal candidiasis   . Neurogenic pain-esophagus 09/04/2012    Medications:  Prescriptions prior to admission  Medication Sig Dispense Refill  . amitriptyline (ELAVIL) 25 MG tablet Take 1 tablet (25 mg total) by mouth at bedtime.  90 tablet  3  . atorvastatin (LIPITOR) 10 MG tablet Take 10 mg by mouth every other day.       . Calcium Carbonate-Vitamin D (CALCIUM 600-D) 600-400 MG-UNIT per tablet Take 1 tablet by mouth daily.       . Cholecalciferol (VITAMIN D3 SUPER STRENGTH) 2000 UNITS TABS Take 1 tablet by mouth daily at 12 noon.       . Cholecalciferol (VITAMIN D3) 400 UNITS CAPS Take 800 Units by mouth at bedtime.      . fludrocortisone (FLORINEF) 0.1 MG tablet Take 0.1 mg by mouth every other day.       . hyoscyamine (LEVSIN SL) 0.125 MG SL tablet Place 0.125 mg under the tongue daily as needed. For IBS      . insulin lispro (HUMALOG) 100 UNIT/ML injection Inject  2-9 Units into the skin 4 (four) times daily -  before meals and at bedtime. Per sliding scale.      . levothyroxine (SYNTHROID, LEVOTHROID) 150 MCG tablet Take 150 mcg by mouth See admin instructions. Take 1 tablet daily Monday through Friday      . levothyroxine (SYNTHROID, LEVOTHROID) 175 MCG tablet Take 175 mcg by mouth See admin instructions. 1 tablet daily on Saturday and Sunday      . Magnesium 250 MG TABS Take 250 mg by mouth daily.      . Multiple Vitamin (MULTIVITAMIN WITH MINERALS) TABS Take 1 tablet by mouth daily.      . mycophenolate (MYFORTIC) 360 MG TBEC Take 360 mg by mouth 2 (two) times daily.       Marland Kitchen omeprazole (PRILOSEC) 20 MG capsule Take 20 mg by mouth every morning.       . predniSONE (DELTASONE) 5 MG tablet Take 5 mg by mouth every morning.       . tacrolimus (PROGRAF) 0.5 MG capsule Take 1-2 mg by mouth 2 (two) times daily. Take 2mg  in the am & 1mg  at night      . warfarin (COUMADIN) 2.5 MG tablet Take 5  mg by mouth every evening. Take as directed by anticoagulation clinic        Assessment: 72 y/o female on chronic Coumadin admitted to [facility] 09/20/12 after an unwitnessed fall. She is now admitted to inpatient rehab. Pharmacy consulted to manage chronic Coumadin for Afib, hx DVT and stroke. INR is supratherapeutic today at 3.73. No bleeding noted, CBC is stable. She was on Cipro for 1.5 days on the inpatient side which may be contributing to the supratherapeutic level.  Home dose of Coumadin is 5 gm daily (confirmed with last anticoag visit on 09/12/12).  Goal of Therapy:  INR 2-3 Monitor platelets by anticoagulation protocol: Yes   Plan:  -No Coumadin tonight -INR daily  [facility], [address].D., BCPS Clinical Pharmacist Pager: [phone] 09/28/2012 4:44 PM

## 2012-09-28 NOTE — Clinical Social Work Note (Signed)
Clinical Social Worker continuing to follow patient and husband for support and discharge needs.  Patient has been accepted to inpatient rehab today and patient and husband are agreeable.  Clinical Social Worker will sign off for now as social work intervention is no longer needed. Please consult [place] again if new need arises.  [name], [place] [phone]

## 2012-09-28 NOTE — Progress Notes (Signed)
PT Cancellation Note  Patient Details Name: Elizabeth [name] MRN: [id number] DOB: 1941-02-27   Cancelled Treatment:     Pt d/cing to CIR today.      Verdell Face, [place] [phone] 09/28/2012

## 2012-09-28 NOTE — PMR Pre-admission (Signed)
PMR Admission Coordinator Pre-Admission Assessment  Patient: Elizabeth [name] is an 72 y.o., female MRN: [id number] DOB: August 29, 1940 Height: 5' 1.5" (156.2 cm) Weight: 64.8 kg (142 lb 13.7 oz)              Insurance Information Primary: Medicare Policy#: [id number] Subscriber: Patient Benefits: [place] Effective Date: 07/22/94  Deductible: $1126 OOP Max: None Life Max: Unlimited CIR: 100% SNF: 100 days LBD:  Outpatient: 80% Copay: 20%  Home Health: 100% DME: 80%  Copay: 20% Providers: Patient's choice  SECONDARY: Mikael Spray      Policy#: [id number]      Subscriber: self Pre-Cert#: None required   Emergency Contact Information Contact Information    Name Relation Home Work Mobile   [name],[name] [phone]  [phone]     Current Medical History  Patient Admitting Diagnosis: 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 female with history coronary artery disease/CABG, chronic atrial fibrillation on Coumadin therapy, CVA, renal transplant 2001 and left BKA 1992. Admitted 09/20/2012 after a fall that was unwitnessed with noted altered mental status. MRI of the brain with no acute intracranial abnormalities noting areas of remote hemorrhagic encephalomalacia in the right occipital lobe and inferior left cerebellum. CT chest and abdomen negative. CT maxillofacial with fractures through the anterior and lateral walls of the right maxillary sinus and 2 fractures within the mid posterior right zygomatic arch and right orbital wall. 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 Bouts of orthostatic hypotension and patient remains on Florinef as prior to hospital admission. Patient remains on chronic Coumadin therapy for atrial fibrillation with no chest pain or shortness of breath. Physical  therapy evaluation completed 09/22/2012 limited by cognition. M.D. is requested physical medicine rehabilitation consult to consider inpatient rehabilitation services 09/29/12 UPDATE: 09/27/12:Urinary tract infection:Toward the end of her hospital stay she became agitated and confused. She was diagnosed with a UTI and started on empiric antibiotics. Her confusion cleared quickly.  Subacute encephalopathy superimposed on chronic dementia: Most likely multifactorial on presentation hypoglycemia, opioids, underlying baseline dementia. Now most likely secondary to UTI. B12 and folate are normal, MRI is negative for CVA,     Past Medical History  Past Medical History  Diagnosis Date  . Coronary artery disease   . Chronic kidney disease     s/p cadaveric renal transplant in 2001  . Diabetes mellitus   . PVD (peripheral vascular disease)   . History of orthostatic hypotension   . Hyperlipidemia   . Hypothyroidism   . Anemia   . Pancreatic cyst   . Osteoporosis     recurrent fractures  . Melanoma   . Carotid bruit     left  . Adenomatous polyp   . GERD (gastroesophageal reflux disease)   . Gastritis   . CVA (cerebral infarction)   . Bradycardia   . Renal artery stenosis   . Diabetic retinopathy(362.0)   . Renal osteodystrophy   . Hyperparathyroidism   . Leg ulcer     from poor fitting prosthetic  . H/O immunosuppressive therapy   . 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 Fx wrist   . Fx ankle   . Neuropathy   . Nephropathy   . Carotid artery stenosis     mild  . Stroke   . Tubular adenoma   .  Colitis   . IBS (irritable bowel syndrome)   . Ischemic colitis   . 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 Esophageal candidiasis   . Neurogenic pain-esophagus 09/04/2012  L BKA in 1992  Family History  Family history includes Heart attack (age of onset:84) in her father and Stroke (age  of onset:73) in her mother.  There is no history of Colon cancer and Diabetes.  Prior Rehab/Hospitalizations: [facility] after CVA, [facility] after BKA   Current Medications  Current facility-administered medications:acetaminophen (TYLENOL) tablet 650 mg, 650 mg, Oral, Q4H PRN, Freeman Caldron, PA, 650 mg at 09/27/12 0748;  amitriptyline (ELAVIL) tablet 10 mg, 10 mg, Oral, QHS, Standley Brooking, MD, 10 mg at 09/27/12 2123;  ciprofloxacin (CIPRO) IVPB 400 mg, 400 mg, Intravenous, Q12H, Freeman Caldron, PA, 400 mg at 09/27/12 2258 docusate sodium (COLACE) capsule 100 mg, 100 mg, Oral, BID, Liz Malady, MD, 100 mg at 09/28/12 1610;  fludrocortisone (FLORINEF) tablet 0.1 mg, 0.1 mg, Oral, QODAY, Shelly Rubenstein, MD, 0.1 mg at 09/27/12 1044;  haloperidol lactate (HALDOL) injection 5 mg, 5 mg, Intravenous, Q6H PRN, Liz Malady, MD, 5 mg at 09/28/12 0059 hydrALAZINE (APRESOLINE) injection 10 mg, 10 mg, Intravenous, Q6H PRN, Liz Malady, MD, 10 mg at 09/27/12 0658;  insulin aspart (novoLOG) injection 1-5 Units, 1-5 Units, Subcutaneous, TID WC & HS, Meredeth Ide, MD, 1 Units at 09/28/12 9604;  insulin glargine (LANTUS) injection 6 Units, 6 Units, Subcutaneous, Daily, Meredeth Ide, MD, 6 Units at 09/28/12 5409 levothyroxine (SYNTHROID, LEVOTHROID) tablet 150 mcg, 150 mcg, Oral, Custom, Shelly Rubenstein, MD, 150 mcg at 09/28/12 8119;  levothyroxine (SYNTHROID, LEVOTHROID) tablet 175 mcg, 175 mcg, Oral, Custom, Shelly Rubenstein, MD, 175 mcg at 09/23/12 1478;  mycophenolate (MYFORTIC) EC tablet 360 mg, 360 mg, Oral, BID, Shelly Rubenstein, MD, 360 mg at 09/28/12 2956 neomycin-bacitracin-polymyxin (NEOSPORIN) ointment, , Topical, Daily, Shelly Rubenstein, MD;  ondansetron [facility]) injection 4 mg, 4 mg, Intravenous, Once, Stevie Kern, MD;  ondansetron [facility]) injection 4 mg, 4 mg, Intravenous, Q6H PRN, Shelly Rubenstein, MD, 4 mg at 09/24/12 0807;  ondansetron (ZOFRAN) tablet 4  mg, 4 mg, Oral, Q6H PRN, Shelly Rubenstein, MD pantoprazole (PROTONIX) EC tablet 40 mg, 40 mg, Oral, Daily, Shelly Rubenstein, MD, 40 mg at 09/28/12 0933;  polyethylene glycol (MIRALAX / GLYCOLAX) packet 17 g, 17 g, Oral, Daily, Liz Malady, MD, 17 g at 09/28/12 0934;  predniSONE (DELTASONE) tablet 5 mg, 5 mg, Oral, q morning - 10a, Shelly Rubenstein, MD, 5 mg at 09/28/12 2130;  tacrolimus (PROGRAF) capsule 1 mg, 1 mg, Oral, QHS, Shelly Rubenstein, MD, 1 mg at 09/27/12 2124 tacrolimus (PROGRAF) capsule 2 mg, 2 mg, Oral, Daily, Shelly Rubenstein, MD, 2 mg at 09/28/12 8657;  traMADol (ULTRAM) tablet 25-50 mg, 25-50 mg, Oral, Q6H PRN, Freeman Caldron, PA, 50 mg at 09/26/12 0915;  traMADol (ULTRAM) tablet 50 mg, 50 mg, Oral, Q6H, Freeman Caldron, PA, 50 mg at 09/28/12 0531;  warfarin (COUMADIN) tablet 5 mg, 5 mg, Oral, q1800, Liz Malady, MD, 5 mg at 09/27/12 1836 Warfarin - Physician Dosing Inpatient, , Does not apply, q1800, Liz Malady, MD  Patients Current Diet: Carb Control  Precautions / Restrictions Precautions Precautions: Fall Other Brace/Splint: R KAFO, L BKA prosthesis Restrictions Weight Bearing Restrictions: No  Prior Activity Level Household: Psychologist, forensic / Equipment Home Assistive Devices/Equipment: Grab bars in shower;Shower chair with back;Walker (specify type);Wheelchair;Eyeglasses Home Adaptive Equipment: Grab bars in shower;Grab bars around toilet;Bedside commode/3-in-1;Shower chair with back;Walker - rolling;Wheelchair - manual  Prior Functional Level Prior Function Level of Independence: Independent with assistive device(s);Needs assistance Needs Assistance: Bathing;Dressing;Toileting;Meal Prep;Light Housekeeping;Transfers Bath: Moderate Dressing: Moderate Toileting: Minimal Meal Prep: Total Light Housekeeping: Total Transfer Assistance: occasional assistance. Pt usually mod I Able to Take Stairs?: Yes (with  assistance) Driving: No Vocation: Retired  Current Functional Level Cognition  Arousal/Alertness: Lethargic Overall Cognitive Status: Impaired Overall Cognitive Status: History of cognitive impairments - at baseline Current Attention Level: Selective Attention - Other Comments: seconds only  Memory: Decreased recall of precautions Memory Deficits: history of memory deficits from previous CVA Orientation Level: Oriented to person;Oriented to place;Oriented to time Following Commands: Follows one step commands consistently Safety/Judgement: Other (comment) (good awareness of safety this session) Safety/Judgement - Other Comments: poor safety and poor judgment.   Awareness of Deficits: Pt with poor quality of movement Cognition - Other Comments: Pt very disoriented with max cues for attention to task Attention: Focused Focused Attention: Impaired Focused Attention Impairment: Verbal basic;Functional basic Memory: Impaired Memory Impairment: Storage deficit;Retrieval deficit;Decreased recall of new information;Decreased long term memory Decreased Long Term Memory: Verbal basic;Functional basic Awareness: Impaired Awareness Impairment: Intellectual impairment;Emergent impairment;Anticipatory impairment Problem Solving: Impaired Problem Solving Impairment: Verbal basic;Functional basic Executive Function: Reasoning;Initiating Reasoning: Impaired Reasoning Impairment: Verbal basic;Functional basic Initiating: Impaired Initiating Impairment: Verbal basic;Functional basic Behaviors: Confabulation Safety/Judgment: Impaired    Extremity Assessment (includes Sensation/Coordination)  RUE ROM/Strength/Tone: WFL for tasks assessed RUE Sensation: WFL - Proprioception RUE Coordination: Deficits (min fine motor coordination deficits ? numbness in hands)  RLE ROM/Strength/Tone: Deficits;Unable to fully assess RLE ROM/Strength/Tone Deficits: general weakness from immobility. premorbid  limitations. KAFO RLE Sensation: Deficits RLE Sensation Deficits: decreased to LTfoot to knee    ADLs  Eating/Feeding: Modified independent Where Assessed - Eating/Feeding: Chair Grooming: Minimal assistance Where Assessed - Grooming: Supported sitting Upper Body Bathing: Minimal assistance Where Assessed - Upper Body Bathing: Supported sitting Lower Body Bathing: Maximal assistance Where Assessed - Lower Body Bathing: Supported sit to stand Upper Body Dressing: Minimal assistance Where Assessed - Upper Body Dressing: Supported sitting Lower Body Dressing: +1 Total assistance Where Assessed - Lower Body Dressing: Supported sit to Pharmacist, hospital: Moderate assistance Toilet Transfer Method: Stand pivot Toilet Transfer Equipment: Bedside commode Toileting - Clothing Manipulation and Hygiene: Maximal assistance Where Assessed - Toileting Clothing Manipulation and Hygiene: Sit on 3-in-1 or toilet Equipment Used: Other (comment) Transfers/Ambulation Related to ADLs: Mod A std pivot to L side. Pt able to bear weight through BLE and assist with stepping during transfer ADL Comments: Called back to room to assist with completing toileting. Pt with ability fto assist with hygien after toileitn by independently positioning self on [facility] for hygiene. Pt transffered to weaker side back to recliner. Able to stand step using therapist for support. Good paritcipation.    Mobility  Bed Mobility: Rolling Right;Right Sidelying to Sit;Sitting - Scoot to [place] of Bed Rolling Right: 4: Min assist Rolling Right: Patient Percentage: 30% Right Sidelying to Sit: 3: Mod assist Right Sidelying to Sit: Patient Percentage: 40% Sitting - Scoot to Edge of Bed: 3: Mod assist Sitting - Scoot to Edge of Bed: Patient Percentage: 50%    Transfers  Transfers: Sit to Stand;Stand to [employer] Transfers Sit to Stand: 3: Mod assist;From bed;With upper extremity assist  Sit to Stand: Patient Percentage:  50% Stand to Sit: 3: Mod assist;To bed;To chair/3-in-1 Stand to Sit: Patient Percentage: 50% Stand Pivot Transfers: 3: Mod assist Stand Pivot Transfers: Patient Percentage: 60%    Ambulation / Gait / Stairs / Wheelchair Mobility  Ambulation/Gait Ambulation/Gait Assistance: 1: +2 Total assist Ambulation/Gait: Patient Percentage: 80% Ambulation Distance (Feet): 8 Feet Assistive device: 2 person hand held assist Ambulation/Gait Assistance Details: Attempted ambulation with RW; un successful; Pt performed better ambulation with hand held assist and VCs for initiation of step Gait Pattern: Step-to pattern;Decreased stride length;Trunk flexed Gait velocity: decreased Stairs: No Wheelchair Mobility Wheelchair Mobility: No    Posture / Balance Static Sitting Balance Static Sitting - Balance Support: Bilateral upper extremity supported;Feet supported Static Sitting - Level of Assistance: 5: Stand by assistance Static Sitting - Comment/# of Minutes: 4 Static Standing Balance Static Standing - Balance Support: Bilateral upper extremity supported Static Standing - Level of Assistance: 2: Max assist Static Standing - Comment/# of Minutes: 2 mins; +dizziness, VC's for arousal and to open eyes    Special needs/care consideration  Skin : - Ecchymosis (B) on arms,face and hip (from fall PTA) -  (R)heel: open ulcer, foam gauze - (L) elbpw skin tear, foam dsg                        Bowel mgmt: WDL, LBM 09/27/12  Bladder mgmt: Incontinent at times. UTI dx on 2/6. C&S pending, Cipro started 2/6.  Diabetic mgmt: yes, IDDM    Previous Home Environment Living Arrangements: Spouse/significant other Lives With: Spouse Available Help at Discharge: Family;Available 24 hours/day Type of Home: House Home Layout: One level Home Access:  (Husband able to get pt in PTA) Bathroom Shower/Tub: Walk-in Stage manager: Standard Bathroom Accessibility: Yes How Accessible: Accessible via  walker Home Care Services: No Additional Comments: Chart reports that pt had all equipment for husband to care for her PTA.  He plans to care for her x 24 hours/day.  He is not present at this time.He only would leave her 1 hour at a time per chart.  She recently had been more demented and hallucitnating.    Discharge Living Setting Type of Home at Discharge: House Discharge Home Layout: Two level;Able to live on main level with bedroom/bathroom Discharge Bathroom Shower/Tub: Walk-in shower Discharge Bathroom Toilet: Standard Discharge Bathroom Accessibility: Yes How Accessible: Accessible via walker Do you have any problems obtaining your medications?: No Pt's husband reports that home is very accessible.  Social/Family/Support Systems Patient Roles: Spouse Contact Information: [phone] Anticipated Caregiver: Husband (who has been providing care for pt for many years). Anticipated Caregiver's Contact Information: [name] Morash: [phone] Ability/Limitations of Caregiver: Min assist Caregiver Availability: 24/7 Discharge Plan Discussed with Primary Caregiver: Yes Is Caregiver In Agreement with Plan?: Yes Does Caregiver/Family have Issues with Lodging/Transportation while Pt is in Rehab?: No  Goals/Additional Needs Patient/Family Goal for Rehab: Supervision-min assist overall Expected length of stay: 2 weeks Cultural Considerations: none Dietary Needs: Carb Modified Equipment Needs: TBD Pt/Family Agrees to Admission and willing to participate: Yes Program Orientation Provided & Reviewed with Pt/Caregiver Including Roles  & Responsibilities: Yes  Decrease burden of Care through IP rehab admission: Plan is to return home. Pt's spouse states his goal is for pt to be able to transfer with supervision to min assist.  Possible need for SNF placement upon discharge: Not anticipated  Patient Condition: Please see physician update to information in consult dated 09/24/12. This  patient's  medical and functional status has changed since the consult dated: 09/24/12. After evaluating the patient today and speaking with the CIR MD and the acute team, we feel that the patient remains appropriate for inpatient rehab. Will admit to inpatient rehab today.   Preadmission Screen Completed By:  Meryl Dare, 09/28/2012 9:55 AM ______________________________________________________________________   Discussed status with Dr. Riley Kill on 09/28/12 at 11:00 AM and received telephone approval for admission today.  Admission Coordinator:  Meryl Dare, time 11:03 AM/Date 09/28/12

## 2012-09-28 NOTE — Progress Notes (Signed)
Agree Tyrez Berrios, MD, MPH, FACS Pager: [phone]  

## 2012-09-29 ENCOUNTER — Inpatient Hospital Stay (HOSPITAL_COMMUNITY): Payer: Medicare Other | Admitting: Physical Therapy

## 2012-09-29 ENCOUNTER — Inpatient Hospital Stay (HOSPITAL_COMMUNITY): Payer: Medicare Other | Admitting: Speech Pathology

## 2012-09-29 ENCOUNTER — Inpatient Hospital Stay (HOSPITAL_COMMUNITY): Payer: Medicare Other | Admitting: Occupational Therapy

## 2012-09-29 DIAGNOSIS — I4891 Unspecified atrial fibrillation: Secondary | ICD-10-CM

## 2012-09-29 DIAGNOSIS — I6350 Cerebral infarction due to unspecified occlusion or stenosis of unspecified cerebral artery: Secondary | ICD-10-CM

## 2012-09-29 DIAGNOSIS — I951 Orthostatic hypotension: Secondary | ICD-10-CM

## 2012-09-29 DIAGNOSIS — E119 Type 2 diabetes mellitus without complications: Secondary | ICD-10-CM

## 2012-09-29 DIAGNOSIS — S060X9A Concussion with loss of consciousness of unspecified duration, initial encounter: Secondary | ICD-10-CM

## 2012-09-29 LAB — URINE CULTURE: Colony Count: >=100000

## 2012-09-29 LAB — PROTIME-INR: INR: 2.23 — ABNORMAL HIGH (ref 0.00–1.49)

## 2012-09-29 LAB — GLUCOSE, CAPILLARY
Glucose-Capillary: 228 mg/dL — ABNORMAL HIGH (ref 70–99)
Glucose-Capillary: 290 mg/dL — ABNORMAL HIGH (ref 70–99)

## 2012-09-29 MED ORDER — HYDRALAZINE HCL 25 MG PO TABS
25.0000 mg | ORAL_TABLET | Freq: Four times a day (QID) | ORAL | Status: DC | PRN
  Filled 2012-09-29: qty 1

## 2012-09-29 MED ORDER — GLUCERNA SHAKE PO LIQD
237.0000 mL | Freq: Three times a day (TID) | ORAL | Status: DC
  Administered 2012-09-29 – 2012-10-08 (×15): 237 mL via ORAL

## 2012-09-29 MED ORDER — WARFARIN SODIUM 5 MG PO TABS
5.0000 mg | ORAL_TABLET | Freq: Once | ORAL | Status: AC
  Administered 2012-09-29: 5 mg via ORAL
  Filled 2012-09-29: qty 1

## 2012-09-29 NOTE — Evaluation (Signed)
Physical Therapy Assessment and Plan  Patient Details  Name: Elizabeth [name] MRN: [id number] Date of Birth: 01/30/1941  PT Diagnosis: Difficulty walking and Muscle weakness Rehab Potential: Good ELOS: 2-3 weeks   Today's Date: 09/29/2012 Time: 1500-1600 Time Calculation (min): 60 min  Problem List:  Patient Active Problem List  Diagnosis  . 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 DIABETES MELLITUS, TYPE II  . HYPERLIPIDEMIA-MIXED  . HYPERTENSION, BENIGN  . CAD, NATIVE VESSEL  . SINUS BRADYCARDIA  . GERD  . Irritable bowel syndrome  . DIZZINESS  . CAROTID BRUITS, BILATERAL  . COLONIC POLYPS, HX OF  . Edema  . Atrial fibrillation  . Encounter for long-term (current) use of anticoagulants  . CVA (cerebral vascular accident)  . DVT of upper extremity (deep vein thrombosis)  . Nocturnal polyuria  . Constipation  . Arteriosclerosis, mesenteric artery  . Orthostatic hypotension  . Neurogenic pain-esophagus  . Fall  . Right orbit fracture  . Zygoma fracture  . Fracture of right maxilla  . Concussion, unspecified  . Hypoglycemia  . Encephalopathy  . UTI (lower urinary tract infection)    Past Medical History:  Past Medical History  Diagnosis Date  . Coronary artery disease   . Chronic kidney disease     s/p cadaveric renal transplant in 2001  . Diabetes mellitus   . PVD (peripheral vascular disease)   . History of orthostatic hypotension   . Hyperlipidemia   . Hypothyroidism   . Anemia   . Pancreatic cyst   . Osteoporosis     recurrent fractures  . Melanoma   . Carotid bruit     left  . Adenomatous polyp   . GERD (gastroesophageal reflux disease)   . Gastritis   . CVA (cerebral infarction)   . Bradycardia   . Renal artery stenosis   . Diabetic retinopathy(362.0)   . Renal osteodystrophy   . Hyperparathyroidism   . Leg ulcer     from poor fitting prosthetic  . H/O immunosuppressive therapy   . 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 Fx wrist   . Fx ankle   . Neuropathy   . Nephropathy   . Carotid artery stenosis     mild  . Stroke   . Tubular adenoma   . Colitis   . IBS (irritable bowel syndrome)   . Ischemic colitis   . 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 Esophageal candidiasis   . Neurogenic pain-esophagus 09/04/2012   Past Surgical History:  Past Surgical History  Procedure Laterality Date  . Kidney transplant  08/15/2000    [facility] in [place]  . 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 Appendectomy    . Cataract extraction    . Cholecystectomy    . Abdominal hysterectomy    . Thyroid surgery    . Tonsillectomy    . Cesarean section      x 2  . Vitrectomy      right eye  . Coronary artery bypass graft    . Colonoscopy w/ biopsies and polypectomy  02/18/2009    adenomatous polyps, diverticulosis, internal hemorrhoids  . Eus  02/04/2010    w/FNA, pancreatic cyst  . Upper gastrointestinal endoscopy  12/01/2009  . Colonoscopy  01/30/2012  Procedure: COLONOSCOPY;  Surgeon: Rachael Fee, MD;  Location: [facility] ENDOSCOPY;  Service: Endoscopy;  Laterality: N/A;  . Esophagogastroduodenoscopy  05/02/2012    Procedure: ESOPHAGOGASTRODUODENOSCOPY (EGD);  Surgeon: Iva Boop, MD;  Location: [facility] ENDOSCOPY;  Service: Endoscopy;  Laterality: N/A;    Assessment & Plan Clinical Impression: Elizabeth [name] is a 72 y.o. 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 Admitted 09/20/2012 after a fall that was unwitnessed with noted altered mental status. MRI of the brain with no acute intracranial abnormalities noting areas of remote hemorrhagic encephalomalacia in  the right occipital lobe and inferior left cerebellum. CT chest and abdomen negative. CT maxillofacial with fractures through the anterior and lateral walls of the right maxillary sinus and 2 fractures within the mid posterior right zygomatic arch and right orbital wall and advise conservative care per ENT Dr. Chales Salmon. 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 Patient with history of orthostatic hypotension and patient remains on Florinef as prior to hospital admission. Patient remains on chronic Coumadin therapy for atrial fibrillation with no chest pain or shortness of breath. Physical therapy evaluation completed 09/22/2012 limited by cognition. M.D. is requested physical medicine rehabilitation consult to consider inpatient rehabilitation services.  Patient transferred to CIR on 09/28/2012 .   Patient currently requires mod with mobility secondary to muscle weakness and decreased motor planning.  Prior to hospitalization, patient was supervision with mobility and lived with Spouse in a House home.  Home access is 1 small step (typically transfers car into w/c and in home over small step)Stairs to enter.  Patient will benefit from skilled PT intervention to maximize safe functional mobility, minimize fall risk and decrease caregiver burden for planned discharge home with 24 hour assist.  Anticipate patient will benefit from follow up [facility] at discharge.  PT - End of Session Activity Tolerance: Tolerates 10 - 20 min activity with multiple rests Endurance Deficit: Yes Endurance Deficit Description: patient drousy throughout evaluation and treatment session and reporting fatigue with activities. PT Assessment Rehab Potential:  Good Barriers to Discharge: None PT Plan PT Intensity: Minimum of 1-2 x/day ,45 to 90 minutes PT Frequency: Total of 15 hours over 7 days Frequency due to: need for increased endurance PT Duration Estimated Length of Stay: 2-3 weeks PT Treatment/Interventions: Ambulation/gait training;Balance/vestibular training;Cognitive remediation/compensation;DME/adaptive equipment instruction;Functional mobility training;Splinting/orthotics;Therapeutic Activities;Patient/family education;Therapeutic Exercise;UE/LE Strength taining/ROM PT Recommendation Follow Up Recommendations: Home health PT Patient destination: Home  Skilled Therapeutic Intervention   PT Evaluation Precautions/Restrictions Precautions Precautions: Fall Required Braces or Orthoses: Other Brace/Splint (Right KAFO: s/p osteoporotic multiple fx's of R tib-fib ) Other Brace/Splint: R KAFO, Left prosthesis for BKA Restrictions Weight Bearing Restrictions: No General Chart Reviewed: Yes Additional Pertinent History: Husband ([name]) present throughout PT Evaluation. He is primary caregiver 24/7 and states that he keeps R KAFO and L prosthesis on patient most of day but recently noted R posterior heel wound which was to be attended to at [facility]  Family/Caregiver Present: Yes Vital SignsTherapy Vitals Temp: 98.1 F (36.7 C) Temp src: Oral Pulse Rate: 69 Resp: 20 BP: 198/98 mmHg Patient Position, if appropriate: Sitting Oxygen Therapy SpO2: 100 % O2 Device: None (  Room air) Pain Pain Assessment Pain Score:   6 Pain Type: Acute pain Pain Location: Face ("I hurt all over" was initial comment) Pain Orientation: Right Pain Descriptors: Aching Pain Onset: On-going Patients Stated Pain Goal: 3 Pain Intervention(s): Medication (See eMAR);Repositioned Multiple Pain Sites:  (L BKA) Home Living/Prior Functioning Home Living Lives With: Spouse Available Help at Discharge: Family;Available 24 hours/day Type of Home: House Home  Access: Stairs to enter [employer] of Steps: 1 small step (typically transfers car into w/c and in home over small step) Entrance Stairs-Rails: None Home Layout: Two level;Full bath on main level;Able to live on main level with bedroom/bathroom Bathroom Shower/Tub: Tub/shower unit;Walk-in shower Bathroom Toilet: Standard Bathroom Accessibility: Yes How Accessible: Accessible via walker Home Adaptive Equipment: Bedside commode/3-in-1;Grab bars around toilet;Grab bars in shower;Shower chair with back;Walker - rolling;Wheelchair - manual Prior Function Level of Independence: Needs assistance with ADLs;Needs assistance with gait;Needs assistance with tranfers Bath: Minimal Toileting: Supervision/set-up Dressing: Supervision/set-up Meal Prep: Total Light Housekeeping: Total Able to Take Stairs?: No Driving: No Vocation: Retired Optometrist - History Baseline Vision: Wears glasses all the time Visual History:  (prior CVA w/ Left peripheral visual deficit) Patient Visual Report: No change from baseline  Cognition Overall Cognitive Status: Impaired at baseline Arousal/Alertness: Awake/alert Orientation Level: Oriented to person;Oriented to place;Oriented to time;Other (comment) (states that she was told about her earlier mistakes) Attention: Focused Focused Attention: Impaired Memory: Impaired Memory Impairment: Storage deficit;Retrieval deficit;Decreased recall of new information Awareness: Impaired Awareness Impairment: Intellectual impairment Problem Solving: Impaired Problem Solving Impairment: Verbal basic;Functional basic Initiating: Impaired Initiating Impairment: Functional basic Behaviors: Restless;Verbal agitation;Poor frustration tolerance Safety/Judgment: Impaired Comments: patient with some earlier visual hallucinations. some comments about a conspiracy and generalized mistrust of staff (?paranoia) Sensation Sensation Light Touch: Appears  Intact Mobility Bed Mobility Bed Mobility: Rolling Right;Rolling Left;Left Sidelying to Sit;Sitting - Scoot to [place] of Bed Rolling Right: 4: Min assist;With rail Rolling Right Details: Manual facilitation for weight shifting Rolling Left: 4: Min assist;With rail Rolling Left Details: Manual facilitation for weight shifting Left Sidelying to Sit: 4: Min assist Left Sidelying to Sit Details: Manual facilitation for weight shifting Sitting - Scoot to Edge of Bed: 3: Mod assist Sitting - Scoot to [place] of Bed Details: Verbal cues for sequencing;Verbal cues for precautions/safety;Manual facilitation for weight shifting Transfers Sit to Stand: 3: Mod assist;From chair/3-in-1;With upper extremity assist Sit to Stand Details: Verbal cues for sequencing;Verbal cues for precautions/safety;Manual facilitation for weight shifting Sit to Stand Details (indicate cue type and reason): R KAFO and Left prosthesis Stand to Sit: 3: Mod assist Stand to Sit Details (indicate cue type and reason): Verbal cues for sequencing;Verbal cues for precautions/safety;Manual facilitation for weight shifting Stand Pivot Transfers: 3: Mod assist Stand Pivot Transfer Details: Verbal cues for sequencing;Verbal cues for precautions/safety;Manual facilitation for weight shifting Locomotion  Ambulation Ambulation: Yes Ambulation/Gait Assistance: 3: Mod assist Ambulation Distance (Feet): 10 Feet Assistive device: 1 person hand held assist Ambulation/Gait Assistance Details: Verbal cues for sequencing;Verbal cues for technique;Verbal cues for precautions/safety;Verbal cues for gait pattern;Manual facilitation for weight shifting Gait Gait Pattern: Step-to pattern;Decreased stride length;Trunk flexed Gait velocity: decreased Stairs / Additional Locomotion Stairs: No Wheelchair Mobility Wheelchair Mobility: Yes Wheelchair Assistance: 3: Mod Education officer, museum: Both upper extremities Wheelchair Parts Management:  Needs assistance  Trunk/Postural Assessment  Cervical Assessment Cervical Assessment: Within Lobbyist Thoracic Assessment: Within Functional Limits Lumbar Assessment Lumbar Assessment: Exceptions to [facility] (flexed with posterior pelvis) Postural Control Postural Control: Deficits  on evaluation  Balance Static Sitting Balance Static Sitting - Balance Support: Bilateral upper extremity supported;Feet supported Static Sitting - Level of Assistance: 5: Stand by assistance Static Standing Balance Static Standing - Balance Support: Bilateral upper extremity supported Static Standing - Level of Assistance: 3: Mod assist Static Standing - Comment/# of Minutes: 1 minute; + fatigue Dynamic Standing Balance Dynamic Standing - Balance Support: Bilateral upper extremity supported Dynamic Standing - Level of Assistance: 3: Mod assist Dynamic Standing - Comments: low marching-in-place/forward/backward Extremity Assessment  RLE Assessment RLE Assessment: Exceptions to WFL (KAFO; hip, knee and ankle 3/5) LLE Assessment LLE Assessment: Exceptions to [facility] (Left BKA; hip 3+/5, knee 3-/5)  Refer to Care Plan for Long Term Goals  Recommendations for other services: None  Discharge Criteria: Patient will be discharged from PT if patient refuses treatment 3 consecutive times without medical reason, if treatment goals not met, if there is a change in medical status, if patient makes no progress towards goals or if patient is discharged from hospital.  The above assessment, treatment plan, treatment alternatives and goals were discussed and mutually agreed upon: by patient and by family  Rex Kras 09/29/2012, 5:31 PM

## 2012-09-29 NOTE — Evaluation (Signed)
Occupational Therapy Assessment and Plan  Patient Details  Name: SUMMIT BORCHARDT MRN: [id number] Date of Birth: Jun 14, 1941  OT Diagnosis: abnormal posture, acute pain, altered mental status, cognitive deficits, disturbance of vision and muscle weakness (generalized) Rehab Potential: Rehab Potential: Good ELOS: 2-3 weeks   Today's Date: 09/29/2012 Time: 1100-1155 Time Calculation (min): 55 min  Problem List:  Patient Active Problem List  Diagnosis  . 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 DIABETES MELLITUS, TYPE II  . HYPERLIPIDEMIA-MIXED  . HYPERTENSION, BENIGN  . CAD, NATIVE VESSEL  . SINUS BRADYCARDIA  . GERD  . Irritable bowel syndrome  . DIZZINESS  . CAROTID BRUITS, BILATERAL  . COLONIC POLYPS, HX OF  . Edema  . Atrial fibrillation  . Encounter for long-term (current) use of anticoagulants  . CVA (cerebral vascular accident)  . DVT of upper extremity (deep vein thrombosis)  . Nocturnal polyuria  . Constipation  . Arteriosclerosis, mesenteric artery  . Orthostatic hypotension  . Neurogenic pain-esophagus  . Fall  . Right orbit fracture  . Zygoma fracture  . Fracture of right maxilla  . Concussion, unspecified  . Hypoglycemia  . Encephalopathy  . UTI (lower urinary tract infection)    Past Medical History:  Past Medical History  Diagnosis Date  . Coronary artery disease   . Chronic kidney disease     s/p cadaveric renal transplant in 2001  . Diabetes mellitus   . PVD (peripheral vascular disease)   . History of orthostatic hypotension   . Hyperlipidemia   . Hypothyroidism   . Anemia   . Pancreatic cyst   . Osteoporosis     recurrent fractures  . Melanoma   . Carotid bruit     left  . Adenomatous polyp   . GERD (gastroesophageal reflux disease)   . Gastritis   . CVA (cerebral infarction)   . Bradycardia   . Renal artery stenosis   . Diabetic retinopathy(362.0)   . Renal osteodystrophy   .  Hyperparathyroidism   . Leg ulcer     from poor fitting prosthetic  . H/O immunosuppressive therapy   . 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 Fx wrist   . Fx ankle   . Neuropathy   . Nephropathy   . Carotid artery stenosis     mild  . Stroke   . Tubular adenoma   . Colitis   . IBS (irritable bowel syndrome)   . Ischemic colitis   . 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 Esophageal candidiasis   . Neurogenic pain-esophagus 09/04/2012   Past Surgical History:  Past Surgical History  Procedure Laterality Date  . Kidney transplant  08/15/2000    [facility] in [place]  . 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 Appendectomy    . Cataract extraction    . Cholecystectomy    . Abdominal hysterectomy    . Thyroid surgery    . Tonsillectomy    . Cesarean section      x 2  . Vitrectomy      right eye  . Coronary artery bypass graft    . Colonoscopy w/ biopsies and polypectomy  02/18/2009    adenomatous polyps, diverticulosis, internal hemorrhoids  . Eus  02/04/2010    w/FNA, pancreatic cyst  .  Upper gastrointestinal endoscopy  12/01/2009  . Colonoscopy  01/30/2012    Procedure: COLONOSCOPY;  Surgeon: Rachael Fee, MD;  Location: [facility] ENDOSCOPY;  Service: Endoscopy;  Laterality: N/A;  . Esophagogastroduodenoscopy  05/02/2012    Procedure: ESOPHAGOGASTRODUODENOSCOPY (EGD);  Surgeon: Iva Boop, MD;  Location: [facility] ENDOSCOPY;  Service: Endoscopy;  Laterality: N/A;    Assessment & Plan Clinical Impression: Patient is a 72 y.o. 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 Admitted 09/20/2012 after a fall that was unwitnessed with noted altered mental status. MRI of  the brain with no acute intracranial abnormalities noting areas of remote hemorrhagic encephalomalacia in the right occipital lobe and inferior left cerebellum. CT chest and abdomen negative. CT maxillofacial with fractures through the anterior and lateral walls of the right maxillary sinus and 2 fractures within the mid posterior right zygomatic arch and right orbital wall and advise conservative care per ENT Dr. Chales Salmon. 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 Patient with history of orthostatic hypotension and patient remains on Florinef as prior to hospital admission. Patient remains on chronic Coumadin therapy for atrial fibrillation with no chest pain or shortness of breath. Physical therapy evaluation completed 09/22/2012 limited by cognition. M.D. is requested physical medicine rehabilitation consult to consider inpatient rehabilitation services. Patient was felt to be a candidate for inpatient rehabilitation services and was admitted for a comprehensive rehabilitation program  .  Patient transferred to CIR on 09/28/2012 .    Patient currently requires total with basic self-care skills secondary to muscle weakness, impaired timing and sequencing and decreased coordination, field cut, decreased attention to right and decreased initiation, decreased attention, decreased awareness, decreased problem solving, decreased safety awareness, decreased memory and delayed processing.  Prior to hospitalization, patient could complete ADLs with mod.  Patient will benefit from skilled intervention to decrease level of assist with basic self-care skills prior to discharge home with care partner.  Anticipate patient will require 24 hour  supervision and moderate physical assestance and follow up home health.  OT Assessment Rehab Potential: Good Barriers to Discharge: None OT Plan OT Intensity: Minimum of 1-2 x/day, 45 to 90 minutes OT Frequency: 5 out of 7 days OT Duration/Estimated Length of Stay: 2-3 weeks OT Treatment/Interventions: Balance/vestibular training;Cognitive remediation/compensation;Community reintegration;Discharge planning;DME/adaptive equipment instruction;Functional mobility training;Pain management;Patient/family education;Psychosocial support;Self Care/advanced ADL retraining;Therapeutic Activities;Therapeutic Exercise;UE/LE Coordination activities;Visual/perceptual remediation/compensation OT Recommendation Patient destination: Home Follow Up Recommendations: Home health OT;24 hour supervision/assistance Equipment Details: pt has all necessary DME from [facility]   Skilled Therapeutic Intervention OT eval completed and initiated education regarding rehab schedule and purpose of rehab stay.  Pt initially difficult to arouse, requiring sternal rubs and cool washcloth, then once aroused pt with increased combativeness and verbally agitated and refusal to participate in ADL assessment reporting "already bathed and did not want to change gowns" despite multiple attempts at encouragement. Engaged in [facility] transfer with max assist and max verbal cues for sequencing with +2 assist for safety and +2 with hygiene.  OT Evaluation Precautions/Restrictions    General   Vital Signs Therapy Vitals Temp: 98.1 F (36.7 C) Temp src: Oral Pulse Rate: 69 Resp: 20 BP: 198/98 mmHg  Patient Position, if appropriate: Sitting Oxygen Therapy SpO2: 100 % O2 Device: None (Room air) Pain   Home Living/Prior Functioning Home Living Lives With: Spouse Available Help at Discharge: Family Type of Home: House Home Access: Stairs to enter Secretary/administrator of Steps: 3 Entrance Stairs-Rails: Right;Left;Can reach both Home  Layout: Two level;Able to live on main level with bedroom/bathroom Bathroom Shower/Tub: Walk-in shower Home Adaptive Equipment: Bedside commode/3-in-1;Grab bars around toilet;Grab bars in shower;Shower chair with back;Walker - rolling;Wheelchair - manual Additional Comments: Pt with fluctuating responses secondary to initial decreased arousal followed by increased agitation. Prior Function Level of Independence: Needs assistance with ADLs Bath: Moderate Toileting: Minimal Dressing: Moderate Meal Prep: Total Light Housekeeping: Total Able to Take Stairs?: Yes (with assistance) Driving: No Vocation: Retired ADL ADL Grooming: Moderate assistance Where Assessed-Grooming: Edge of bed Upper Body Bathing: Dependent Where Assessed-Upper Body Bathing: Bed level Lower Body Bathing: Dependent Where Assessed-Lower Body Bathing: Bed level Upper Body Dressing: Moderate assistance Where Assessed-Upper Body Dressing: Chair Toileting: Dependent Where Assessed-Toileting: Bedside Commode Toilet Transfer: Maximal assistance Toilet Transfer Method: Stand pivot Toilet Transfer Equipment: Bedside commode ADL Comments: Pt with decreased arousal initially, requiring tactile cues, sternal rubs, and cool washcloth.  Pt then with increased agitation and initial refusal to participate. Vision/Perception  Vision - History Baseline Vision: Wears glasses all the time Visual History: Other (comment) (poor vision from prior CVA) Patient Visual Report: No change from baseline Vision - Assessment Eye Alignment: Within Functional Limits Vision Assessment: Vision tested Ocular Range of Motion: Impaired-to be further tested in functional context Tracking/Visual Pursuits: Requires cues, head turns, or add eye shifts to track;Impaired - to be further tested in functional context Perception Perception: Within Functional Limits Praxis Praxis: Intact  Cognition Overall Cognitive Status: Impaired at  baseline Arousal/Alertness: Lethargic Orientation Level: Oriented to person Attention: Focused Focused Attention: Impaired Memory: Impaired Memory Impairment: Storage deficit;Retrieval deficit;Decreased recall of new information Awareness: Impaired Awareness Impairment: Intellectual impairment Problem Solving: Impaired Problem Solving Impairment: Verbal basic;Functional basic Initiating: Impaired Initiating Impairment: Functional basic Behaviors: Restless;Verbal agitation;Poor frustration tolerance Safety/Judgment: Impaired Sensation Sensation Light Touch: Appears Intact Proprioception: Appears Intact Coordination Fine Motor Movements are Fluid and Coordinated: No Finger Nose Finger Test: decreased on Lt side Extremity/Trunk Assessment RUE Assessment RUE Assessment: Within Functional Limits (grossly 4/5) LUE Assessment LUE Assessment: Within Functional Limits (grossly 4/5)  FIM:  FIM - Grooming Grooming Steps: Wash, rinse, dry face Grooming: 2: Patient completes 1 of 4 or 2 of 5 steps FIM - Bathing Bathing: 1: Total-Patient completes 0-2 of 10 parts or less than 25% FIM - Upper Body Dressing/Undressing Upper body dressing/undressing: 0: Wears gown/pajamas-no public clothing FIM - Lower Body Dressing/Undressing Lower body dressing/undressing: 0: Wears Oceanographer FIM - Toileting Toileting: 1: Two helpers FIM - Press photographer: 4: Supine > Sit: Min A (steadying Pt. > 75%/lift 1 leg);2: Bed > Chair or W/C: Max A (lift and lower assist) FIM - Diplomatic Services operational officer Devices: Bedside commode Toilet Transfers: 2-To toilet/BSC: Max A (lift and lower assist);2-From toilet/BSC: Max A (lift and lower assist) FIM - Tub/Shower Transfers Tub/shower Transfers: 0-Activity did not occur or was simulated   Refer to Care Plan for Long Term Goals  Recommendations for other services: None  Discharge Criteria: Patient will  be discharged from OT if patient refuses treatment 3 consecutive times without medical reason, if treatment goals not met, if there is a change in medical status, if patient makes no progress towards goals or  if patient is discharged from hospital.  The above assessment, treatment plan, treatment alternatives and goals were discussed and mutually agreed upon: by patient  Rosalio Loud, OTR/L 09/29/2012, 4:14 PM

## 2012-09-29 NOTE — Evaluation (Signed)
Speech Language Pathology Assessment and Plan  Patient Details  Name: Elizabeth [name] MRN: [id number] Date of Birth: 10-15-1940  SLP Diagnosis: Cognitive Impairments  Rehab Potential: Good ELOS: 2-3 weeks   Today's Date: 09/29/2012 Time: 1308-6578 Time Calculation (min): 60 min  Problem List:  Patient Active Problem List  Diagnosis  . 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 DIABETES MELLITUS, TYPE II  . HYPERLIPIDEMIA-MIXED  . HYPERTENSION, BENIGN  . CAD, NATIVE VESSEL  . SINUS BRADYCARDIA  . GERD  . Irritable bowel syndrome  . DIZZINESS  . CAROTID BRUITS, BILATERAL  . COLONIC POLYPS, HX OF  . Edema  . Atrial fibrillation  . Encounter for long-term (current) use of anticoagulants  . CVA (cerebral vascular accident)  . DVT of upper extremity (deep vein thrombosis)  . Nocturnal polyuria  . Constipation  . Arteriosclerosis, mesenteric artery  . Orthostatic hypotension  . Neurogenic pain-esophagus  . Fall  . Right orbit fracture  . Zygoma fracture  . Fracture of right maxilla  . Concussion, unspecified  . Hypoglycemia  . Encephalopathy  . UTI (lower urinary tract infection)   Past Medical History:  Past Medical History  Diagnosis Date  . Coronary artery disease   . Chronic kidney disease     s/p cadaveric renal transplant in 2001  . Diabetes mellitus   . PVD (peripheral vascular disease)   . History of orthostatic hypotension   . Hyperlipidemia   . Hypothyroidism   . Anemia   . Pancreatic cyst   . Osteoporosis     recurrent fractures  . Melanoma   . Carotid bruit     left  . Adenomatous polyp   . GERD (gastroesophageal reflux disease)   . Gastritis   . CVA (cerebral infarction)   . Bradycardia   . Renal artery stenosis   . Diabetic retinopathy(362.0)   . Renal osteodystrophy   . Hyperparathyroidism   . Leg ulcer     from poor fitting prosthetic  . H/O immunosuppressive therapy   . 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 Fx wrist   . Fx ankle   . Neuropathy   . Nephropathy   . Carotid artery stenosis     mild  . Stroke   . Tubular adenoma   . Colitis   . IBS (irritable bowel syndrome)   . Ischemic colitis   . 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 Esophageal candidiasis   . Neurogenic pain-esophagus 09/04/2012   Past Surgical History:  Past Surgical History  Procedure Laterality Date  . Kidney transplant  08/15/2000    [facility] in   . 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 Appendectomy    . Cataract extraction    . Cholecystectomy    . Abdominal hysterectomy    . Thyroid surgery    . Tonsillectomy    . Cesarean section      x 2  . Vitrectomy      right eye  . Coronary artery bypass graft    . Colonoscopy w/ biopsies and polypectomy  02/18/2009    adenomatous polyps, diverticulosis, internal hemorrhoids  . Eus  02/04/2010    w/FNA, pancreatic cyst  . Upper gastrointestinal endoscopy  12/01/2009  . Colonoscopy  01/30/2012    Procedure: COLONOSCOPY;  Surgeon: Rachael Fee, MD;  Location: [facility] ENDOSCOPY;  Service: Endoscopy;  Laterality: N/A;  . Esophagogastroduodenoscopy  05/02/2012    Procedure: ESOPHAGOGASTRODUODENOSCOPY (EGD);  Surgeon: Iva Boop, MD;  Location: [facility] ENDOSCOPY;  Service: Endoscopy;  Laterality: N/A;    Assessment / Plan / Recommendation Clinical Impression  The patient is a 72 year old female with a past medical history significant for coronary artery disease, diabetes mellitus, peripheral vascular disease, cadaver renal transplant, CVA, GERD, and left BKA,  to name a few.  She was admitted 09/20/12, after an unwitnessed fall.  MRI of brain showed no acute abnormalities.  CT did show multiple facial fractures.  Mental status has improved, but there is a question of TBI and/or underlying dementia.  Follow-up  MRI of brain on 09/23/12 again showed no acute intracranial abnormalities.  Upon evaluation today, she presented with severe cognitive impairment.  No family members were available for additional information. She was very difficult to keep awake, stating that she did not sleep well.  Nursing stated that she had not been given sedative medication.  She will now be seen for cognitive therapy.      SLP Assessment  Patient will need skilled Speech Lanaguage Pathology Services during CIR admission    Recommendations  Diet Recommendations: Regular;Thin liquid Liquid Administration via: Cup;Straw Medication Administration: Whole meds with liquid Supervision: Patient able to self feed (May need encouragement to consume nutrition) Postural Changes and/or Swallow Maneuvers: Seated upright 90 degrees Oral Care Recommendations: Oral care BID Patient destination: Home Follow up Recommendations: Home Health SLP Equipment Recommended: None recommended by SLP    SLP Frequency 5 out of 7 days   SLP Treatment/Interventions Cognitive remediation/compensation;Patient/family education;Therapeutic Activities;Functional tasks    Pain Pain Assessment Pain Assessment: No/denies pain Pain Score:   6 Pain Type: Acute pain Pain Location: Face ("I hurt all over" was initial comment) Pain Orientation: Right Pain Descriptors: Aching Pain Onset: On-going Patients Stated Pain Goal: 3 Pain Intervention(s): Medication (See eMAR);Repositioned Multiple Pain Sites:  (L BKA) Prior Functioning Type of Home: House Lives With: Spouse Available Help at Discharge: Family;Available 24 hours/day Vocation: Retired  Teacher, music Term Goals: Week 1: SLP Short Term Goal 1 (Week 1): Patient will demonstrate basic problem solving during functional tasks with mod A verbal, visual, and tactile cues.   SLP Short Term Goal 2 (Week 1): Patient will sustain attention to tasks for three minutes with mod A verbal, visual, and tactile cues.  SLP  Short Term Goal 3 (Week 1): Patient will use environmental aids with min A verbal cues for orientation of time, place, and current surroundings.   See FIM for current functional status Refer to Care Plan for Long Term Goals  Recommendations for other services: None  Discharge Criteria: Patient will be discharged from SLP if patient refuses treatment 3 consecutive times without medical reason, if treatment goals not met, if there is a change in medical status, if patient makes no progress towards goals or if patient is discharged from hospital.  The above assessment, treatment plan, treatment alternatives and goals were discussed and mutually agreed upon: by patient  Lenny Pastel 09/29/2012, 6:57 PM

## 2012-09-29 NOTE — Progress Notes (Signed)
ANTICOAGULATION CONSULT NOTE - Initial Consult  Pharmacy Consult for Coumadin Indication: atrial fibrillation, hx DVT, hx stroke  Allergies  Allergen Reactions  . Percocet (Oxycodone-Acetaminophen) Nausea And Vomiting    Patient Measurements: Height: 5' 1.5" (156.2 cm) Weight: 142 lb 13.7 oz (64.799 kg) IBW/kg (Calculated) : 48.95   Vital Signs: Temp: 97.5 F (36.4 C) (02/08 0600) Temp src: Oral (02/08 0600) BP: 175/80 mmHg (02/08 1100) Pulse Rate: 73 (02/08 0600)  Labs:  Recent Labs  09/27/12 0450 09/27/12 0816 09/28/12 0555 09/29/12 1130  HGB  --  10.9*  --   --   HCT  --  32.3*  --   --   PLT  --  158  --   --   LABPROT 22.0*  --  34.7* 23.7*  INR 2.01*  --  3.73* 2.23*  CREATININE  --  0.64  --   --     Estimated Creatinine Clearance: 54.7 ml/min (by C-G formula based on Cr of 0.64).  Assessment: 72 y/o female on chronic Coumadin admitted to [facility] 09/20/12 after an unwitnessed fall. She is now admitted to inpatient rehab. Pharmacy consulted to manage chronic Coumadin for Afib, hx DVT and stroke. INR is therapeutic today at 2.23. Supratherapeutic INR likely related to cipro interaction which has now been dc'd. No bleeding noted, CBC is stable as of 2/6.   Goal of Therapy:  INR 2-3   Plan:  1. Warfarin 5mg  PO x 1 tonight 2. F/u AM INR 3. Consider restart amitriptyline  Lysle Pearl, PharmD, BCPS Pager # [phone] 09/29/2012 12:51 PM

## 2012-09-29 NOTE — Progress Notes (Signed)
INITIAL NUTRITION ASSESSMENT  DOCUMENTATION CODES Per approved criteria  -Not Applicable   INTERVENTION: 1. Glucerna Shake po TID, each supplement provides 220 kcal and 10 grams of protein.   2. RD will continue to follow    NUTRITION DIAGNOSIS: Increased nutrient needs related to TBI and pressure ulcer as evidenced by estimated nutrition needs.   Goal: PO intake to meet >/=90% estimated nutrition needs  Monitor:  PO intake, weight trends, I/O's, labs  Reason for Assessment: Malnutrition Screening Tool  72 y.o. female  Admitting Dx: TBI  ASSESSMENT: Pt with new admission to rehab from [facility] with TBI. Pt was followed at acute admission for poor po intake and hx of weight loss prior to TBI. Weight up 3 lbs from acute admission weight (139 lbs).  Pt intake improved through out acute admission per records.  Pt reports poor appetite at this time. Breakfast showed 0% completion, pt reports she ate some bacon.   Willing to continue to drink nutrition supplements.   Height: Ht Readings from Last 1 Encounters:  09/28/12 5' 1.5" (1.562 m)    Weight: Wt Readings from Last 1 Encounters:  09/28/12 142 lb 13.7 oz (64.799 kg)    Ideal Body Weight: 105 lbs  (adj for BKA)  % Ideal Body Weight: 135%  Wt Readings from Last 10 Encounters:  09/28/12 142 lb 13.7 oz (64.799 kg)  09/27/12 142 lb 13.7 oz (64.8 kg)  09/04/12 146 lb (66.225 kg)  06/21/12 154 lb (69.854 kg)  06/13/12 154 lb (69.854 kg)  06/06/12 149 lb 4 oz (67.699 kg)  04/30/12 154 lb 8 oz (70.081 kg)  03/12/12 156 lb 6 oz (70.931 kg)  01/27/12 160 lb (72.576 kg)  01/27/12 160 lb (72.576 kg)    Usual Body Weight: 160 lbs   % Usual Body Weight: 89%  BMI:  27.9 kg/(m^2) adj for BKA  Estimated Nutritional Needs: Kcal: 1600-1800 Protein: 90-100 gm  Fluid: 1.6-1.8 L/day  Skin: stage II, R heel  Diet Order: Carb Control  EDUCATION NEEDS: -No education needs identified at this time   Intake/Output Summary  (Last 24 hours) at 09/29/12 1031 Last data filed at 09/29/12 0842  Gross per 24 hour  Intake     60 ml  Output      0 ml  Net     60 ml    Last BM: 2/6   Labs:   Recent Labs Lab 09/23/12 0450 09/24/12 0520 09/27/12 0816  NA 139 137 137  K 3.8 3.7 3.7  CL 101 102 98  CO2 32 29 31  BUN 10 15 13   CREATININE 0.67 0.71 0.64  CALCIUM 9.1 8.7 9.5  GLUCOSE 132* 43* 163*    CBG (last 3)   Recent Labs  09/28/12 1645 09/28/12 2120 09/29/12 0728  GLUCAP 214* 151* 183*    Scheduled Meds: . cefUROXime  500 mg Oral BID WC  . fludrocortisone  0.1 mg Oral QODAY  . insulin aspart  1-5 Units Subcutaneous TID WC & HS  . insulin glargine  6 Units Subcutaneous Daily  . [START ON 10/01/2012] levothyroxine  150 mcg Oral Custom  . levothyroxine  175 mcg Oral Custom  . mycophenolate  360 mg Oral BID  . pantoprazole  40 mg Oral Daily  . polyethylene glycol  17 g Oral Daily  . predniSONE  5 mg Oral Daily  . senna  1 tablet Oral BID  . tacrolimus  1 mg Oral QHS  . tacrolimus  2 mg  Oral Daily  . Warfarin - Pharmacist Dosing Inpatient   Does not apply q1800    Continuous Infusions:   Past Medical History  Diagnosis Date  . Coronary artery disease   . Chronic kidney disease     s/p cadaveric renal transplant in 2001  . Diabetes mellitus   . PVD (peripheral vascular disease)   . History of orthostatic hypotension   . Hyperlipidemia   . Hypothyroidism   . Anemia   . Pancreatic cyst   . Osteoporosis     recurrent fractures  . Melanoma   . Carotid bruit     left  . Adenomatous polyp   . GERD (gastroesophageal reflux disease)   . Gastritis   . CVA (cerebral infarction)   . Bradycardia   . Renal artery stenosis   . Diabetic retinopathy(362.0)   . Renal osteodystrophy   . Hyperparathyroidism   . Leg ulcer     from poor fitting prosthetic  . H/O immunosuppressive therapy   . 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 Fx wrist   . Fx ankle   . Neuropathy   .  Nephropathy   . Carotid artery stenosis     mild  . Stroke   . Tubular adenoma   . Colitis   . IBS (irritable bowel syndrome)   . Ischemic colitis   . 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 Esophageal candidiasis   . Neurogenic pain-esophagus 09/04/2012    Past Surgical History  Procedure Laterality Date  . Kidney transplant  08/15/2000    [facility] in [place]  . 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 Appendectomy    . Cataract extraction    . Cholecystectomy    . Abdominal hysterectomy    . Thyroid surgery    . Tonsillectomy    . Cesarean section      x 2  . Vitrectomy      right eye  . Coronary artery bypass graft    . Colonoscopy w/ biopsies and polypectomy  02/18/2009    adenomatous polyps, diverticulosis, internal hemorrhoids  . Eus  02/04/2010    w/FNA, pancreatic cyst  . Upper gastrointestinal endoscopy  12/01/2009  . Colonoscopy  01/30/2012    Procedure: COLONOSCOPY;  Surgeon: Rachael Fee, MD;  Location: [facility] ENDOSCOPY;  Service: Endoscopy;  Laterality: N/A;  . Esophagogastroduodenoscopy  05/02/2012    Procedure: ESOPHAGOGASTRODUODENOSCOPY (EGD);  Surgeon: Iva Boop, MD;  Location: [facility] ENDOSCOPY;  Service: Endoscopy;  Laterality: N/A;   Clarene Duke RD, LDN Pager [phone] After Hours pager [phone]

## 2012-09-29 NOTE — Plan of Care (Addendum)
Overall Plan of Care [facility]) Patient Details Name: RETIA CORDLE MRN: [id number] DOB: 01-Jan-1941  Diagnosis:  TBI  Co-morbidities: multi-trauma, hypotension, hx renal transplant, CAD, dm with dpn  Functional Problem List  Patient demonstrates impairments in the following areas: Balance, Behavior, Bladder, Bowel, Cognition, Endurance, Medication Management, Motor, Nutrition, Pain, Perception, Safety, Sensory , Skin Integrity and Vision  Basic ADL's: grooming, bathing, dressing and toileting Advanced ADL's: simple meal preparation  Transfers:  bed mobility, bed to chair, toilet, tub/shower and car Locomotion:  ambulation  Additional Impairments:  Social Cognition   problem solving, memory, attention and awareness  Anticipated Outcomes Item Anticipated Outcome  Eating/Swallowing  Independent  Basic self-care  Mod assist  Tolieting  Min assist  Bowel/Bladder  Continent of bowel and bladder  Transfers  S/min-Assist  Locomotion  Gait using RW x 50' with min-A  Communication    Cognition  Min assist  Pain  Pain level less than or equal to 3  Safety/Judgment  Min assist  Other     Therapy Plan: PT Intensity: Minimum of 1-2 x/day ,45 to 90 minutes PT Frequency: Total of 15 hours over 7 days Frequency due to: need for increased endurance PT Duration Estimated Length of Stay: 2-3 weeks OT Intensity: Minimum of 1-2 x/day, 45 to 90 minutes OT Frequency: 5 out of 7 days OT Duration/Estimated Length of Stay: 2-3 weeks SLP Intensity: Minimum of 1-2 x/day, 30 to 60 minutes SLP Frequency: 5 out of 7 days SLP Duration/Estimated Length of Stay: 2-3 weeks    Team Interventions: Item RN PT OT SLP SW TR Other  Self Care/Advanced ADL Retraining   x      Neuromuscular Re-Education  x       Therapeutic Activities  x x x  x   UE/LE Strength Training/ROM  x       UE/LE Coordination Activities   x      Visual/Perceptual Remediation/Compensation   x   x   DME/Adaptive  Equipment Instruction  x x   x   Therapeutic Exercise  x x   x   Balance/Vestibular Training  x x   x   Patient/Family Education x x x x  x   Cognitive Remediation/Compensation  x x x  x   Functional Mobility Training  x x   x   Ambulation/Gait Training  x       Garment/textile technologist Reintegration      x   Dysphagia/Aspiration Film/video editor         Bladder Management x        Bowel Management x        Disease Management/Prevention x        Pain Management x  x      Medication Management x        Skin Care/Wound Management x        Splinting/Orthotics         Discharge Planning x  x x  x   Psychosocial Support   x x  x                          Team Discharge Planning: Destination: PT-Home ,OT- Home , SLP- Home Projected Follow-up: PT-Home  health PT, OT-  Home health OT;24 hour supervision/assistance, SLP- Home Health Projected Equipment Needs: PT- , OT-  , SLP- None Patient/family involved in discharge planning: PT- Patient;Family member/caregiver,  OT-Patient, SLP- Patient/caregiver  MD ELOS: 2-3 weeks Medical Rehab Prognosis:  Excellent Assessment: The patient has been admitted for CIR therapies. The team will be addressing, functional mobility, strength, stamina, balance, safety, adaptive techniques/equipment, self-care, bowel and bladder mgt, patient and caregiver education, NMR, cognitive perceptual training, pain mgt, wound care. Goals have been set at minimal assistance with basic mobility/cognition and min to mod assist with self-care.      See Team Conference Notes for weekly updates to the plan of care

## 2012-09-29 NOTE — Progress Notes (Signed)
Elizabeth [name] is a 72 y.o. female [birth year] [id number]  Subjective: No new complaints. No new problems.  Feeling OK. "I'm getting out of here"  Objective: Vital signs in last 24 hours: Temp:  [97.5 F (36.4 C)-98.8 F (37.1 C)] 97.5 F (36.4 C) (02/08 0600) Pulse Rate:  [65-73] 73 (02/08 0600) Resp:  [18-20] 20 (02/08 0600) BP: (138-201)/(62-76) 201/76 mmHg (02/08 0600) SpO2:  [92 %-98 %] 92 % (02/08 0600) Weight:  [142 lb 13.7 oz (64.799 kg)] 142 lb 13.7 oz (64.799 kg) (02/07 1617) Weight change:  Last BM Date: 09/28/12  Intake/Output from previous day:   Last cbgs: CBG (last 3)   Recent Labs  09/28/12 1645 09/28/12 2120 09/29/12 0728  GLUCAP 214* 151* 183*     Physical Exam General: No apparent distress    Lungs: Normal effort. Lungs clear to auscultation, no crackles or wheezes. Cardiovascular: Regular rate and rhythm, no edema Musculoskeletal:  Neurovascularly intact; L BKA Neurological: No new neurological deficits Wounds: N/A     Skin - clear; R face old bruise Alert, confused  Lab Results: BMET    Component Value Date/Time   NA 137 09/27/2012 0816   K 3.7 09/27/2012 0816   CL 98 09/27/2012 0816   CO2 31 09/27/2012 0816   GLUCOSE 163* 09/27/2012 0816   BUN 13 09/27/2012 0816   CREATININE 0.64 09/27/2012 0816   CALCIUM 9.5 09/27/2012 0816   GFRNONAA 86* 09/27/2012 0816   GFRAA >90 09/27/2012 0816   CBC    Component Value Date/Time   WBC 6.8 09/27/2012 0816   RBC 3.62* 09/27/2012 0816   HGB 10.9* 09/27/2012 0816   HCT 32.3* 09/27/2012 0816   PLT 158 09/27/2012 0816   MCV 89.2 09/27/2012 0816   MCH 30.1 09/27/2012 0816   MCHC 33.7 09/27/2012 0816   RDW 13.6 09/27/2012 0816   LYMPHSABS 0.8 01/27/2012 1257   MONOABS 0.4 01/27/2012 1257   EOSABS 0.0 01/27/2012 1257   BASOSABS 0.0 01/27/2012 1257    Studies/Results: No results found.  Medications: I have reviewed the patient's current medications.  Assessment/Plan:  Post Admission Physician Evaluation:  1. Functional  deficits secondary to traumatic brain injury/polytrauma. (hx of left BKA) 2. Patient is admitted to receive collaborative, interdisciplinary care between the physiatrist, rehab nursing staff, and therapy team. 3. Patient's level of medical complexity and substantial therapy needs in context of that medical necessity cannot be provided at a lesser intensity of care such as a SNF. 4. Patient has experienced substantial functional loss from his/her baseline which was documented above under the "Functional History" and "Functional Status" headings. Judging by the patient's diagnosis, physical exam, and functional history, the patient has potential for functional progress which will result in measurable gains while on inpatient rehab. These gains will be of substantial and practical use upon discharge in facilitating mobility and self-care at the household level. 5. Physiatrist will provide 24 hour management of medical needs as well as oversight of the therapy plan/treatment and provide guidance as appropriate regarding the interaction of the two. 6. 24 hour rehab nursing will assist with bladder management, bowel management, safety, skin/wound care, disease management, medication administration, pain management and patient education and help integrate therapy concepts, techniques,education, etc. 7. PT will assess and treat for: Lower extremity strength, range of motion, stamina, balance, functional mobility, safety, adaptive techniques and equipment, NMR, prosthetic and orthotic assessment, pain mgt. Goals are: min to mod assist. 8. OT will assess and treat for: ADL's, functional mobility,  safety, upper extremity strength, adaptive techniques and equipment, NMR, orthotic use, pain mgt. Goals are: min to mod assist. 9. SLP will assess and treat for: cognition and communication. Goals are: supervision. 10. Case Management and Social Worker will assess and treat for psychological issues and discharge  planning. 11. Team conference will be held weekly to assess progress toward goals and to determine barriers to discharge. 12. Patient will receive at least 3 hours of therapy per day at least 5 days per week. 13. ELOS: 2-3 weeks Prognosis: good Medical Problem List and Plan:  1. 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 DVT Prophylaxis/Anticoagulation: Chronic Coumadin for a true fibrillation. Monitor for any bleeding episodes  3. Pain Management: Ultram as needed. Monitor with increased activity  4. Mood/underlying dementia. Will discuss baseline with husband. Cognition likely a major factor in her falls and gait disorder.  5. Neuropsych: This patient is not capable of making decisions on his/her own behalf.  6. Multiple facial fractures. Conservative care per ENT  7. Chronic orthostatic hypotension. Continue Florinef as directed  8. History of CVA. Husband notes premorbid chronic visual problems related to stroke 2012  9. Renal transplant 2001. Prograf/prednisone  10. 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 Will modify the data as needed  11. Diabetes mellitus with peripheral neuropathy. Hemoglobin A1c 6.3. Lantus insulin 6 units daily. Check blood sugars a.c. and at bedtime  12. Coronary artery disease/CABG. No chest pain or shortness of breath  Cont Rx    Length of stay, days: 1  Sonda Primes , MD 09/29/2012, 9:48 AM

## 2012-09-30 ENCOUNTER — Inpatient Hospital Stay (HOSPITAL_COMMUNITY): Payer: Medicare Other | Admitting: Physical Therapy

## 2012-09-30 DIAGNOSIS — I10 Essential (primary) hypertension: Secondary | ICD-10-CM

## 2012-09-30 DIAGNOSIS — S069X9A Unspecified intracranial injury with loss of consciousness of unspecified duration, initial encounter: Secondary | ICD-10-CM

## 2012-09-30 DIAGNOSIS — G9340 Encephalopathy, unspecified: Secondary | ICD-10-CM

## 2012-09-30 DIAGNOSIS — S069XAA Unspecified intracranial injury with loss of consciousness status unknown, initial encounter: Secondary | ICD-10-CM

## 2012-09-30 DIAGNOSIS — R42 Dizziness and giddiness: Secondary | ICD-10-CM

## 2012-09-30 HISTORY — DX: Unspecified intracranial injury with loss of consciousness of unspecified duration, initial encounter: S06.9X9A

## 2012-09-30 HISTORY — DX: Unspecified intracranial injury with loss of consciousness status unknown, initial encounter: S06.9XAA

## 2012-09-30 LAB — GLUCOSE, CAPILLARY
Glucose-Capillary: 227 mg/dL — ABNORMAL HIGH (ref 70–99)
Glucose-Capillary: 119 mg/dL — ABNORMAL HIGH (ref 70–99)
Glucose-Capillary: 213 mg/dL — ABNORMAL HIGH (ref 70–99)

## 2012-09-30 MED ORDER — WARFARIN SODIUM 5 MG PO TABS
5.0000 mg | ORAL_TABLET | Freq: Once | ORAL | Status: AC
  Administered 2012-09-30: 5 mg via ORAL
  Filled 2012-09-30: qty 1

## 2012-09-30 NOTE — Progress Notes (Signed)
Elizabeth [name] is a 72 y.o. female 1940-09-02 [id number]  Subjective: No new complaints. No new problems.  Feeling OK.   Objective: Vital signs in last 24 hours: Temp:  [97.7 F (36.5 C)-98.1 F (36.7 C)] 97.7 F (36.5 C) (02/09 0610) Pulse Rate:  [63-69] 68 (02/09 0610) Resp:  [18-20] 18 (02/09 0610) BP: (146-198)/(52-98) 150/64 mmHg (02/09 0726) SpO2:  [94 %-100 %] 94 % (02/09 0610) Weight change:  Last BM Date: 09/29/12  Intake/Output from previous day: 02/08 0701 - 02/09 0700 In: 460 [P.O.:460] Out: -  Last cbgs: CBG (last 3)   Recent Labs  09/29/12 1617 09/29/12 2111 09/30/12 0719  GLUCAP 290* 188* 167*   BP Readings from Last 3 Encounters:  09/30/12 150/64  09/28/12 165/63  09/04/12 114/68     Physical Exam General: No apparent distress    Lungs: Normal effort. Lungs clear to auscultation, no crackles or wheezes. Cardiovascular: Regular rate and rhythm, no edema Musculoskeletal:  Neurovascularly intact; L BKA Neurological: No new neurological deficits Wounds: N/A     Skin - clear; R face old bruise Alert, confused  Lab Results: BMET    Component Value Date/Time   NA 137 09/27/2012 0816   K 3.7 09/27/2012 0816   CL 98 09/27/2012 0816   CO2 31 09/27/2012 0816   GLUCOSE 163* 09/27/2012 0816   BUN 13 09/27/2012 0816   CREATININE 0.64 09/27/2012 0816   CALCIUM 9.5 09/27/2012 0816   GFRNONAA 86* 09/27/2012 0816   GFRAA >90 09/27/2012 0816   CBC    Component Value Date/Time   WBC 6.8 09/27/2012 0816   RBC 3.62* 09/27/2012 0816   HGB 10.9* 09/27/2012 0816   HCT 32.3* 09/27/2012 0816   PLT 158 09/27/2012 0816   MCV 89.2 09/27/2012 0816   MCH 30.1 09/27/2012 0816   MCHC 33.7 09/27/2012 0816   RDW 13.6 09/27/2012 0816   LYMPHSABS 0.8 01/27/2012 1257   MONOABS 0.4 01/27/2012 1257   EOSABS 0.0 01/27/2012 1257   BASOSABS 0.0 01/27/2012 1257    Studies/Results: No results found.  Medications: I have reviewed the patient's current medications.  Assessment/Plan:  Post Admission  Physician Evaluation:  1. Functional deficits secondary to traumatic brain injury/polytrauma. (hx of left BKA) 2. Patient is admitted to receive collaborative, interdisciplinary care between the physiatrist, rehab nursing staff, and therapy team. 3. Patient's level of medical complexity and substantial therapy needs in context of that medical necessity cannot be provided at a lesser intensity of care such as a SNF. 4. Patient has experienced substantial functional loss from his/her baseline which was documented above under the "Functional History" and "Functional Status" headings. Judging by the patient's diagnosis, physical exam, and functional history, the patient has potential for functional progress which will result in measurable gains while on inpatient rehab. These gains will be of substantial and practical use upon discharge in facilitating mobility and self-care at the household level. 5. Physiatrist will provide 24 hour management of medical needs as well as oversight of the therapy plan/treatment and provide guidance as appropriate regarding the interaction of the two. 6. 24 hour rehab nursing will assist with bladder management, bowel management, safety, skin/wound care, disease management, medication administration, pain management and patient education and help integrate therapy concepts, techniques,education, etc. 7. PT will assess and treat for: Lower extremity strength, range of motion, stamina, balance, functional mobility, safety, adaptive techniques and equipment, NMR, prosthetic and orthotic assessment, pain mgt. Goals are: min to mod assist. 8. OT will assess  and treat for: ADL's, functional mobility, safety, upper extremity strength, adaptive techniques and equipment, NMR, orthotic use, pain mgt. Goals are: min to mod assist. 9. SLP will assess and treat for: cognition and communication. Goals are: supervision. 10. Case Management and Social Worker will assess and treat for  psychological issues and discharge planning. 11. Team conference will be held weekly to assess progress toward goals and to determine barriers to discharge. 12. Patient will receive at least 3 hours of therapy per day at least 5 days per week. 13. ELOS: 2-3 weeks Prognosis: good Medical Problem List and Plan:  1. 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 DVT Prophylaxis/Anticoagulation: Chronic Coumadin for a true fibrillation. Monitor for any bleeding episodes  3. Pain Management: Ultram as needed. Monitor with increased activity  4. Mood/underlying dementia. Will discuss baseline with husband. Cognition likely a major factor in her falls and gait disorder.  5. Neuropsych: This patient is not capable of making decisions on his/her own behalf.  6. Multiple facial fractures. Conservative care per ENT  7. Chronic orthostatic hypotension. Continue Florinef as directed  8. History of CVA. Husband notes premorbid chronic visual problems related to stroke 2012  9. Renal transplant 2001. Prograf/prednisone  10. 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 Will modify the data as needed  11. Diabetes mellitus with peripheral neuropathy. Hemoglobin A1c 6.3. Lantus insulin 6 units daily. Check blood sugars a.c. and at bedtime  12. Coronary artery disease/CABG. No chest pain or shortness of breath 11. Elev BP - Florinef was held   Length of stay, days: 2  Sonda Primes , MD 09/30/2012, 9:39 AM

## 2012-09-30 NOTE — Progress Notes (Signed)
ANTICOAGULATION CONSULT NOTE - Initial Consult  Pharmacy Consult for Coumadin Indication: atrial fibrillation, hx DVT, hx stroke  Allergies  Allergen Reactions  . Percocet (Oxycodone-Acetaminophen) Nausea And Vomiting    Patient Measurements: Height: 5' 1.5" (156.2 cm) Weight: 142 lb 13.7 oz (64.799 kg) IBW/kg (Calculated) : 48.95   Vital Signs: Temp: 97.7 F (36.5 C) (02/09 0610) Temp src: Oral (02/09 0610) BP: 150/64 mmHg (02/09 0726) Pulse Rate: 68 (02/09 0610)  Labs:  Recent Labs  09/28/12 0555 09/29/12 1130 09/30/12 0650  LABPROT 34.7* 23.7* 24.6*  INR 3.73* 2.23* 2.34*    Estimated Creatinine Clearance: 54.7 ml/min (by C-G formula based on Cr of 0.64).  Assessment: 72 y/o female on chronic Coumadin admitted to [facility] 09/20/12 after an unwitnessed fall. She is now admitted to inpatient rehab. Pharmacy consulted to manage chronic Coumadin for Afib, hx DVT and stroke. INR is therapeutic today at 2.34. Supratherapeutic INR previously likely related to cipro interaction which has now been dc'd. No bleeding noted, CBC is stable as of 2/6.   Goal of Therapy:  INR 2-3   Plan:  1. Warfarin 5mg  PO x 1 tonight 2. F/u AM INR 3. Consider restart amitriptyline 4. Consider increasing lantus as CBGs have been elevated  Lysle Pearl, PharmD, BCPS Pager # [phone] 09/30/2012 10:31 AM

## 2012-09-30 NOTE — Progress Notes (Signed)
Physical Therapy Note  Patient Details  Name: Elizabeth [name] MRN: [id number] Date of Birth: 03-18-1941 Today's Date: 09/30/2012  1045 -1115 (30 minutes) individual (missed 15 minutes secondary to nursing issues) Pain: no complaint of pain Focus of treatment: bed mobility/transfer training Treatment: Pt in bed upon arrival; pt reports seeing "gold coins in her hand"; RT KAFO donned by therapist; ? Donning of left prosthesis; sit to supine min /mod assist, mod/max to scoot forward; transfer scoot (secondary to not donning prosthesis) max assist; pt placed at nurses station.   Levette Paulick,JIM 09/30/2012, 7:32 AM

## 2012-10-01 ENCOUNTER — Inpatient Hospital Stay (HOSPITAL_COMMUNITY): Payer: Medicare Other | Admitting: Occupational Therapy

## 2012-10-01 ENCOUNTER — Encounter (HOSPITAL_COMMUNITY): Payer: Medicare Other | Admitting: Occupational Therapy

## 2012-10-01 ENCOUNTER — Inpatient Hospital Stay (HOSPITAL_COMMUNITY): Payer: Medicare Other | Admitting: Physical Therapy

## 2012-10-01 ENCOUNTER — Inpatient Hospital Stay (HOSPITAL_COMMUNITY): Payer: Medicare Other | Admitting: Speech Pathology

## 2012-10-01 DIAGNOSIS — S88119A Complete traumatic amputation at level between knee and ankle, unspecified lower leg, initial encounter: Secondary | ICD-10-CM

## 2012-10-01 DIAGNOSIS — S069X9A Unspecified intracranial injury with loss of consciousness of unspecified duration, initial encounter: Secondary | ICD-10-CM

## 2012-10-01 DIAGNOSIS — I951 Orthostatic hypotension: Secondary | ICD-10-CM

## 2012-10-01 DIAGNOSIS — S069XAA Unspecified intracranial injury with loss of consciousness status unknown, initial encounter: Secondary | ICD-10-CM

## 2012-10-01 DIAGNOSIS — W19XXXA Unspecified fall, initial encounter: Secondary | ICD-10-CM

## 2012-10-01 DIAGNOSIS — IMO0001 Reserved for inherently not codable concepts without codable children: Secondary | ICD-10-CM

## 2012-10-01 DIAGNOSIS — E1165 Type 2 diabetes mellitus with hyperglycemia: Secondary | ICD-10-CM

## 2012-10-01 LAB — GLUCOSE, CAPILLARY
Glucose-Capillary: 314 mg/dL — ABNORMAL HIGH (ref 70–99)
Glucose-Capillary: 287 mg/dL — ABNORMAL HIGH (ref 70–99)

## 2012-10-01 LAB — COMPREHENSIVE METABOLIC PANEL
ALT: 9 U/L (ref 0–35)
Albumin: 2.8 g/dL — ABNORMAL LOW (ref 3.5–5.2)
Alkaline Phosphatase: 79 U/L (ref 39–117)
BUN: 15 mg/dL (ref 6–23)
Chloride: 95 mEq/L — ABNORMAL LOW (ref 96–112)
Potassium: 3.4 mEq/L — ABNORMAL LOW (ref 3.5–5.1)
Sodium: 137 mEq/L (ref 135–145)
Total Bilirubin: 0.6 mg/dL (ref 0.3–1.2)

## 2012-10-01 LAB — CBC WITH DIFFERENTIAL/PLATELET
Basophils Relative: 0 % (ref 0–1)
Hemoglobin: 11 g/dL — ABNORMAL LOW (ref 12.0–15.0)
Lymphs Abs: 0.9 10*3/uL (ref 0.7–4.0)
MCHC: 32.8 g/dL (ref 30.0–36.0)
Monocytes Relative: 11 % (ref 3–12)
Neutro Abs: 3.7 10*3/uL (ref 1.7–7.7)
Neutrophils Relative %: 71 % (ref 43–77)
RBC: 3.72 MIL/uL — ABNORMAL LOW (ref 3.87–5.11)

## 2012-10-01 LAB — PROTIME-INR: Prothrombin Time: 25 seconds — ABNORMAL HIGH (ref 11.6–15.2)

## 2012-10-01 MED ORDER — INSULIN GLARGINE 100 UNIT/ML ~~LOC~~ SOLN
10.0000 [IU] | Freq: Every day | SUBCUTANEOUS | Status: DC
  Administered 2012-10-02: 10 [IU] via SUBCUTANEOUS

## 2012-10-01 MED ORDER — WARFARIN SODIUM 5 MG PO TABS
5.0000 mg | ORAL_TABLET | Freq: Once | ORAL | Status: AC
  Administered 2012-10-01: 5 mg via ORAL
  Filled 2012-10-01: qty 1

## 2012-10-01 MED ORDER — BUPIVACAINE-EPINEPHRINE PF 0.25-1:200000 % IJ SOLN
INTRAMUSCULAR | Status: AC
  Filled 2012-10-01: qty 30

## 2012-10-01 MED ORDER — POTASSIUM CHLORIDE CRYS ER 20 MEQ PO TBCR
20.0000 meq | EXTENDED_RELEASE_TABLET | Freq: Two times a day (BID) | ORAL | Status: AC
  Administered 2012-10-01 – 2012-10-02 (×4): 20 meq via ORAL
  Filled 2012-10-01 (×4): qty 1

## 2012-10-01 MED ORDER — TRAZODONE HCL 50 MG PO TABS
50.0000 mg | ORAL_TABLET | Freq: Every day | ORAL | Status: DC
  Administered 2012-10-01 – 2012-10-03 (×3): 50 mg via ORAL
  Filled 2012-10-01 (×3): qty 1

## 2012-10-01 NOTE — Progress Notes (Signed)
Social Work  Social Work Assessment and Plan  Patient Details  Name: Elizabeth [name] MRN: [id number] Date of Birth: [birth year]  Today's Date: 10/01/2012  Problem List:  Patient Active Problem List  Diagnosis  . 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 DIABETES MELLITUS, TYPE II  . HYPERLIPIDEMIA-MIXED  . HYPERTENSION, BENIGN  . CAD, NATIVE VESSEL  . SINUS BRADYCARDIA  . GERD  . Irritable bowel syndrome  . DIZZINESS  . CAROTID BRUITS, BILATERAL  . COLONIC POLYPS, HX OF  . Edema  . Atrial fibrillation  . Encounter for long-term (current) use of anticoagulants  . CVA (cerebral vascular accident)  . DVT of upper extremity (deep vein thrombosis)  . Nocturnal polyuria  . Constipation  . Arteriosclerosis, mesenteric artery  . Orthostatic hypotension  . Neurogenic pain-esophagus  . Fall  . Right orbit fracture  . Zygoma fracture  . Fracture of right maxilla  . Concussion, unspecified  . Hypoglycemia  . Encephalopathy  . UTI (lower urinary tract infection)  . TBI (traumatic brain injury)   Past Medical History:  Past Medical History  Diagnosis Date  . Coronary artery disease   . Chronic kidney disease     s/p cadaveric renal transplant in 2001  . Diabetes mellitus   . PVD (peripheral vascular disease)   . History of orthostatic hypotension   . Hyperlipidemia   . Hypothyroidism   . Anemia   . Pancreatic cyst   . Osteoporosis     recurrent fractures  . Melanoma   . Carotid bruit     left  . Adenomatous polyp   . GERD (gastroesophageal reflux disease)   . Gastritis   . CVA (cerebral infarction)   . Bradycardia   . Renal artery stenosis   . Diabetic retinopathy(362.0)   . Renal osteodystrophy   . Hyperparathyroidism   . Leg ulcer     from poor fitting prosthetic  . H/O immunosuppressive therapy   . 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 Fx wrist   . Fx ankle   . Neuropathy   . Nephropathy   .  Carotid artery stenosis     mild  . Stroke   . Tubular adenoma   . Colitis   . IBS (irritable bowel syndrome)   . Ischemic colitis   . 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 Esophageal candidiasis   . Neurogenic pain-esophagus 09/04/2012   Past Surgical History:  Past Surgical History  Procedure Laterality Date  . Kidney transplant  08/15/2000    [facility] in [place]  . 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 Appendectomy    . Cataract extraction    . Cholecystectomy    . Abdominal hysterectomy    . Thyroid surgery    . Tonsillectomy    . Cesarean section      x 2  . Vitrectomy      right eye  . Coronary artery bypass graft    . Colonoscopy w/ biopsies and polypectomy  02/18/2009    adenomatous polyps, diverticulosis, internal hemorrhoids  . Eus  02/04/2010    w/FNA, pancreatic cyst  . Upper gastrointestinal endoscopy  12/01/2009  . Colonoscopy  01/30/2012    Procedure: COLONOSCOPY;  Surgeon: Rachael Fee, MD;  Location: [facility] ENDOSCOPY;  Service: Endoscopy;  Laterality: N/A;  . Esophagogastroduodenoscopy  05/02/2012    Procedure: ESOPHAGOGASTRODUODENOSCOPY (EGD);  Surgeon: Iva Boop, MD;  Location: [facility] ENDOSCOPY;  Service: Endoscopy;  Laterality: N/A;   Social History:  reports that she has never smoked. She has never used smokeless tobacco. She reports that she does not drink alcohol or use illicit drugs.  Family / Support Systems Marital Status: Married How Long?: 49 years Patient Roles: Spouse Spouse/Significant Other: husband, [name] Buer @ [phone] or (C) [phone] Children: daughter, Herbert Seta, living in [place], [place] and supportive, however, is a Arts development officer and availability is limited;  son, Arlys John, living in [place] Anticipated Caregiver: Husband Ability/Limitations of Caregiver: None Caregiver Availability: 24/7 Family Dynamics:  Husband has been providing support to patient for many years and remain very attentive and supportive  Social History Preferred language: English Religion: Catholic Cultural Background: NA Education: HS Read: Yes Write: Yes Employment Status: Retired Fish farm manager Issues: None - Husband is pt's next of kin for decisions as MD feels unable to make decision on her own behalf at this time.   Abuse/Neglect Physical Abuse: Denies Verbal Abuse: Denies Sexual Abuse: Denies Exploitation of patient/patient's resources: Denies Self-Neglect: Denies  Emotional Status Pt's affect, behavior adn adjustment status: Pt sitting up in bed with eyes closed while she answered my general questions.  Flat affect and only offered brief amswers to questions.  Orientation to place and situation intact, however, husband notes that this does fluctuate.  Pt appeared slightly annoyed at times with my questions.  Denies any s/s of depression or anxiety and not open to taking BDI screen.  Will defer for now as will likely benefit from a neuropsychological evaluation. Recent Psychosocial Issues: None of note per husband Pyschiatric History: None per husband - no formal intervention, however, does note h/o visual hallucinations and paranoia at times since CVA - primary MD place her on amitryptiline Substance Abuse History: None  Patient / Family Perceptions, Expectations & Goals Pt/Family understanding of illness & functional limitations: Pt able to provide basic information that she fell at home and hit her head.  Husband reports "she had a concussion" and has basic understanding of her current functional limitations. Premorbid pt/family roles/activities: husband reports that pt usually used a cane/ walker at home and that he really does not leave her alone for greater than an hour at a time due to premorbid cognitive deficits since CVAs.  He must assist with any community level activities. Anticipated  changes in roles/activities/participation: Little change anticipated as husband was providing primary support to patient and their household PTA. Pt/family expectations/goals: "I'd like to see her thinking get a little clearer"  [employer]: None Premorbid Home Care/DME Agencies: None Transportation available at discharge: yes Resource referrals recommended: Neuropsychology;Support group (specify)  Discharge Planning Living Arrangements: Spouse/significant other Support Systems: Spouse/significant other;Friends/neighbors Type of Residence: Private residence Insurance Resources: Administrator (specify) Administrator) Financial Resources: Social Security Living Expenses: Own Money Management: Spouse Do you have any problems obtaining your medications?: No Home Management: husband Patient/Family Preliminary Plans: Pt and husband plan for her to return home with him as primary caregiver Social Work Anticipated Follow Up Needs: HH/OP;Support Group Expected length of stay: 2 weeks  Clinical Impression Elderly woman here after a fall and TBI.  Very little information gathered beyond basic personal information.  Pt's husband has served as her caregiver for several years and plans to continue to do so upon d/c.  Hopeful her cognition will clear some  and that her "sundowning" tendencies will lessen.  Continue to follow.  Monika Chestang 10/01/2012, 4:54 PM

## 2012-10-01 NOTE — Progress Notes (Signed)
Patient information reviewed and entered into eRehab system by Annsley Akkerman, RN, CRRN, PPS Coordinator.  Information including medical coding and functional independence measure will be reviewed and updated through discharge.     Per nursing patient was given "Data Collection Information Summary for Patients in Inpatient Rehabilitation Facilities with attached "Privacy Act Statement-Health Care Records" upon admission.  

## 2012-10-01 NOTE — Progress Notes (Signed)
Occupational Therapy Session Note  Patient Details  Name: Elizabeth [name] MRN: [id number] Date of Birth: 24-Jun-1941  Today's Date: 10/01/2012 Time: 1130-1200 Time Calculation (min): 30 min  Short Term Goals: Week 1:  OT Short Term Goal 1 (Week 1): Pt with complete UB bathing with min assist with min cues for initiation OT Short Term Goal 2 (Week 1): Pt will complete LB bathing with mod assist with min cues for initiation OT Short Term Goal 3 (Week 1): Pt will complete toilet transfer with mod assist with necessary AE. OT Short Term Goal 4 (Week 1): Pt will demonstrate recall of prior sessions with mod questioning cues.  Skilled Therapeutic Interventions/Progress Updates:    1:1 Participated in toileting and toilet transfer with RW with min A. Doffed brief and donned underwear with max A, sit to stands with mod A. Pt able to pull up underwear with steady A with alternating hands on RW (always maintaining at least one UE support. cognition: administered the GOAT.  Pt able to participate in minimal distracting environment. Pt scored 70. Pt does demonstrate some language of confusion with recall of events and places with self correction with min to mod A.  Therapy Documentation Precautions:  Precautions Precautions: Fall Precaution Comments: Lt BKA Required Braces or Orthoses: Other Brace/Splint (Right KAFO: s/p osteoporotic multiple fx's of R tib-fib ) Other Brace/Splint: R KAFO, Left prosthesis for BKA Restrictions Weight Bearing Restrictions: No Pain: Reports she is sore- helped reposition and RN aware  See FIM for current functional status  Therapy/Group: Individual Therapy  Roney Mans [facility] 10/01/2012, 12:19 PM

## 2012-10-01 NOTE — Progress Notes (Signed)
Speech Language Pathology Daily Session Note  Patient Details  Name: Elizabeth [name] MRN: [id number] Date of Birth: 18-Sep-1940  Today's Date: 10/01/2012 Time: 0900-0940 Time Calculation (min): 40 min  Short Term Goals: Week 1: SLP Short Term Goal 1 (Week 1): Patient will demonstrate orientation with min assist question cues to utilize external aids. SLP Short Term Goal 2 (Week 1): Patient will demonstrate sustained attention to basic tasks for 2 minutes with min assist verbal cues. SLP Short Term Goal 3 (Week 1): Patient will demonstrate basic problem solving with familiar tasks with mod assist verbal, visual and tactile cues. SLP Short Term Goal 4 (Week 1): Patient will recall daily events/tasks with mod assist verbal and visual cues to utilize external aids.  Skilled Therapeutic Interventions: Skilled treatment session focused on addressing cognitive goals.  SLP facilitated session with mod assist cues for orientation, awareness of deficits and problem solving. Patient demonstrated decreased sustained attention to topic of conversation and task throughout session with overall mod assist cues; however, session ended 5 minutes early due to fatigue and inability to sustain arousal.    FIM:  Comprehension Comprehension Mode: Auditory Comprehension: 2-Understands basic 25 - 49% of the time/requires cueing 51 - 75% of the time Expression Expression Mode: Verbal Expression: 3-Expresses basic 50 - 74% of the time/requires cueing 25 - 50% of the time. Needs to repeat parts of sentences. Social Interaction Social Interaction: 2-Interacts appropriately 25 - 49% of time - Needs frequent redirection. Problem Solving Problem Solving: 2-Solves basic 25 - 49% of the time - needs direction more than half the time to initiate, plan or complete simple activities Memory Memory: 2-Recognizes or recalls 25 - 49% of the time/requires cueing 51 - 75% of the time  Pain Pain Assessment Pain Assessment:  0-10 Pain Score:   8 Pain Type: Acute pain Pain Location: Other (Comment) Pain Orientation: Right Pain Descriptors: Aching Pain Onset: Gradual Pain Intervention(s): RN made aware Multiple Pain Sites: No  Therapy/Group: Individual Therapy  Charlane Ferretti., CCC-SLP [phone]  Tameshia Bonneville 10/01/2012, 10:12 AM

## 2012-10-01 NOTE — Progress Notes (Signed)
Subjective/Complaints: No specific complaints. Slept well A 12 point review of systems has been performed and if not noted above is otherwise negative.   Objective:   Vital Signs: Blood pressure 160/70, pulse 64, temperature 98 F (36.7 C), temperature source Oral, resp. rate 18, height 5' 1.5" (1.562 m), weight 64.799 kg (142 lb 13.7 oz), SpO2 90.00%. No results found.  Recent Labs  10/01/12 0715  WBC 5.2  HGB 11.0*  HCT 33.5*  PLT 217   No results found for this basename: NA, K, CL, CO, GLUCOSE, BUN, CREATININE, CALCIUM,  in the last 72 hours CBG (last 3)   Recent Labs  09/30/12 2149 09/30/12 2323 10/01/12 0735  GLUCAP 179* 213* 314*    Wt Readings from Last 3 Encounters:  09/28/12 64.799 kg (142 lb 13.7 oz)  09/27/12 64.8 kg (142 lb 13.7 oz)  09/04/12 66.225 kg (146 lb)    Physical Exam:  .Vitals reviewed.  HENT:  Multiple bruising and bruising to facial area  Eyes: EOM are normal.  Neck: Normal range of motion. Neck supple. No thyromegaly present.  Cardiovascular:  Cardiac rate control  Pulmonary/Chest: Effort normal and breath sounds normal. No respiratory distress.  Abdominal: Soft. Bowel sounds are normal. She exhibits no distension.  Neurological: She is fairly alert.  Follow simple commands. Easily distracted. Sometimes difficult to keep on task. Occasionally irritable. Motor strength is 4/5 in bilateral biceps, triceps, wrist flexors and extension and grip as well as hip flexors knee extensors ankle dorsiflexors and plantar flexors  Sensory exam is normal in the upper extremities but appears diminished in the distal right lowe limb.  Has difficulty with left finger nose to finger  Left field cut on confrontation testing. Gaze conjugate.  Wearing AFO right lower ext with movement noted within brace.  Skin: multiple bruises and contusions noted throughout. Left BKA stump intact.    Assessment/Plan: 1. Functional deficits secondary to TBI which  require 3+ hours per day of interdisciplinary therapy in a comprehensive inpatient rehab setting. Physiatrist is providing close team supervision and 24 hour management of active medical problems listed below. Physiatrist and rehab team continue to assess barriers to discharge/monitor patient progress toward functional and medical goals. FIM: FIM - Bathing Bathing: 1: Total-Patient completes 0-2 of 10 parts or less than 25%  FIM - Upper Body Dressing/Undressing Upper body dressing/undressing: 0: Wears gown/pajamas-no public clothing FIM - Lower Body Dressing/Undressing Lower body dressing/undressing: 0: Wears Oceanographer  FIM - Toileting Toileting: 1: Two helpers  FIM - Diplomatic Services operational officer Devices: Psychiatrist Transfers: 3-To toilet/BSC: Mod A (lift or lower assist) (With patient wearing R KAFO and L prosthesis)  FIM - Games developer Transfer: 4: Supine > Sit: Min A (steadying Pt. > 75%/lift 1 leg);4: Sit > Supine: Min A (steadying pt. > 75%/lift 1 leg);3: Bed > Chair or W/C: Mod A (lift or lower assist);3: Chair or W/C > Bed: Mod A (lift or lower assist)  FIM - Locomotion: Ambulation Locomotion: Ambulation Assistive Devices: Other (comment) (handheld assist) Ambulation/Gait Assistance: 3: Mod assist Locomotion: Ambulation: 1: Travels less than 50 ft with moderate assistance (Pt: 50 - 74%)  Comprehension Comprehension Mode: Auditory Comprehension: 2-Understands basic 25 - 49% of the time/requires cueing 51 - 75% of the time  Expression Expression Mode: Verbal Expression: 2-Expresses basic 25 - 49% of the time/requires cueing 50 - 75% of the time. Uses single words/gestures.  Social Interaction Social Interaction: 2-Interacts appropriately 25 - 49% of  time - Needs frequent redirection.  Problem Solving Problem Solving: 1-Solves basic less than 25% of the time - needs direction nearly all the time or does not  effectively solve problems and may need a restraint for safety  Memory Memory: 1-Recognizes or recalls less than 25% of the time/requires cueing greater than 75% of the time  Medical Problem List and Plan:  1. 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 DVT Prophylaxis/Anticoagulation: Chronic Coumadin for a true fibrillation. Monitor for any bleeding episodes  3. Pain Management: Ultram as needed. Pain control fair at present 4. Mood/underlying dementia. Will discuss baseline with husband. Cognition likely a major factor in her falls and gait disorder.  5. Neuropsych: This patient is not capable of making decisions on his/her own behalf.  6. Multiple facial fractures. Conservative care per ENT  7. Chronic orthostatic hypotension. Continue Florinef as directed for bp  8. History of CVA. Husband notes premorbid chronic visual problems related to stroke 2012  9. Renal transplant 2001. Prograf/prednisone  10. 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 Will modify the data as needed  11. Diabetes mellitus with peripheral neuropathy. Hemoglobin A1c 6.3. Lantus insulin--titrate upward.. Check blood sugars a.c. and at bedtime  12. Coronary artery disease/CABG. No chest pain or shortness of breath    LOS (Days) 3 A FACE TO FACE EVALUATION WAS PERFORMED  Sayre Witherington T 10/01/2012 8:06 AM

## 2012-10-01 NOTE — Progress Notes (Signed)
ANTICOAGULATION CONSULT NOTE - Follow Up Consult  Pharmacy Consult for coumadin Indication: hx of afib/CVA/DVT  Allergies  Allergen Reactions  . Percocet (Oxycodone-Acetaminophen) Nausea And Vomiting    Patient Measurements: Height: 5' 1.5" (156.2 cm) Weight: 142 lb 13.7 oz (64.799 kg) IBW/kg (Calculated) : 48.95 Heparin Dosing Weight:   Vital Signs: Temp: 98 F (36.7 C) (02/10 0636) Temp src: Oral (02/10 0636) BP: 160/70 mmHg (02/10 0651) Pulse Rate: 64 (02/10 0636)  Labs:  Recent Labs  09/29/12 1130 09/30/12 0650 10/01/12 0715  HGB  --   --  11.0*  HCT  --   --  33.5*  PLT  --   --  217  LABPROT 23.7* 24.6* 25.0*  INR 2.23* 2.34* 2.39*  CREATININE  --   --  0.82    Estimated Creatinine Clearance: 53.3 ml/min (by C-G formula based on Cr of 0.82).   Medications:  Scheduled:  . cefUROXime  500 mg Oral BID WC  . feeding supplement  237 mL Oral TID BM  . fludrocortisone  0.1 mg Oral QODAY  . insulin aspart  1-5 Units Subcutaneous TID WC & HS  . [START ON 10/02/2012] insulin glargine  10 Units Subcutaneous Daily  . levothyroxine  150 mcg Oral Custom  . levothyroxine  175 mcg Oral Custom  . mycophenolate  360 mg Oral BID  . pantoprazole  40 mg Oral Daily  . polyethylene glycol  17 g Oral Daily  . predniSONE  5 mg Oral Daily  . senna  1 tablet Oral BID  . tacrolimus  1 mg Oral QHS  . tacrolimus  2 mg Oral Daily  . [COMPLETED] warfarin  5 mg Oral ONCE-1800  . warfarin  5 mg Oral ONCE-1800  . Warfarin - Pharmacist Dosing Inpatient   Does not apply q1800  . [DISCONTINUED] insulin glargine  6 Units Subcutaneous Daily   Infusions:    Assessment: 72 yo femael with history of afib/CVA/DVT is currently on therapeutic coumadin.  INR today is stable at 2.39 Goal of Therapy:  INR 2-3    Plan:  1) Coumadin 5mg  po x1 2) INR in am. If INR stabilizes, can put back on home coumadin 5mg  po qday.  Adisyn Ruscitti, [name] 10/01/2012,8:28 AM

## 2012-10-01 NOTE — Progress Notes (Signed)
Occupational Therapy Session Note  Patient Details  Name: Elizabeth [name] MRN: [id number] Date of Birth: April 17, 1941  Today's Date: 10/01/2012 Time: 0730-0830 Time Calculation (min): 60 min  Short Term Goals: Week 1:  OT Short Term Goal 1 (Week 1): Pt with complete UB bathing with min assist with min cues for initiation OT Short Term Goal 2 (Week 1): Pt will complete LB bathing with mod assist with min cues for initiation OT Short Term Goal 3 (Week 1): Pt will complete toilet transfer with mod assist with necessary AE. OT Short Term Goal 4 (Week 1): Pt will demonstrate recall of prior sessions with mod questioning cues.  Skilled Therapeutic Interventions/Progress Updates:    1:1 self care retraining including bed mobility, squat pivot transfer bed <w/c<>toilet with grab bar with max A without brace/ Prosthesis due to urgency. Participated in dressing only- pt requested not to bath today due to fatigue. Donned skirt over head and fasten with supervision, mod A to stand with Prosthesis and KAFO on demonstrating standing balance with min A with bilateral UE support to pull down skirt. Pt oriented to situation (fall in BR at home), place, month and day of week and time with min cuing. Pt was cooperative and willing to work despite her fatigue. Required min cuing to locate items due to visual issues throughout session.   Therapy Documentation Precautions:  Precautions Precautions: Fall Precaution Comments: Lt BKA Required Braces or Orthoses: Other Brace/Splint (Right KAFO: s/p osteoporotic multiple fx's of R tib-fib ) Other Brace/Splint: R KAFO, Left prosthesis for BKA Restrictions Weight Bearing Restrictions: No Pain: At end of session c/o back pain RN made aware See FIM for current functional status  Therapy/Group: Individual Therapy  Roney Mans [facility] 10/01/2012, 11:26 AM

## 2012-10-01 NOTE — Progress Notes (Signed)
Occupational Therapy Session Note  Patient Details  Name: Elizabeth [name] MRN: [id number] Date of Birth: February 28, 1941  Today's Date: 10/01/2012 Time: 1400-1430 Time Calculation (min): 30 min  Short Term Goals: Week 1:  OT Short Term Goal 1 (Week 1): Pt with complete UB bathing with min assist with min cues for initiation OT Short Term Goal 2 (Week 1): Pt will complete LB bathing with mod assist with min cues for initiation OT Short Term Goal 3 (Week 1): Pt will complete toilet transfer with mod assist with necessary AE. OT Short Term Goal 4 (Week 1): Pt will demonstrate recall of prior sessions with mod questioning cues.  Skilled Therapeutic Interventions/Progress Updates:    1:1 focus on sit to stands with anterior weight shift, feet maintaining contact with floor, standing balance with  UE support, short distance functional ambulation with RW with min A and A to steer RW around obstacles, and bed mobility with mod A.   Therapy Documentation Precautions:  Precautions Precautions: Fall Precaution Comments: Lt BKA Required Braces or Orthoses: Other Brace/Splint (Right KAFO: s/p osteoporotic multiple fx's of R tib-fib ) Other Brace/Splint: R KAFO, Left prosthesis for BKA Restrictions Weight Bearing Restrictions: No Pain: Pain Assessment Pain Score:   5 Pain Type: Acute pain Pain Location: Back Pain Intervention(s): Medication (See eMAR)  See FIM for current functional status  Therapy/Group: Individual Therapy  Roney Mans [facility] 10/01/2012, 3:53 PM

## 2012-10-01 NOTE — Progress Notes (Signed)
Dr. Posey Rea notified of patient's CBG 179 at 2149, but missed giving patient 1 unit insulin within 1 hr time limit; CBG now 213, but patient combative and refusing to take 2 units insulin; patient now in chair at nurse's station and still refusing to take insulin.  No new orders received.

## 2012-10-01 NOTE — Progress Notes (Addendum)
Physical Therapy Note  Patient Details  Name: RIELLE SCHLAUCH MRN: [id number] Date of Birth: November 17, 1940 Today's Date: 10/01/2012  0347-4259 (40 minutes) individual Pain: unrated c/o of back pain/ premedicated  Focus of treatment: sit to stand/ transfer training/ gait training/  Treatment: Pt in recliner upon arrival; sit to stand from recliner mod assist with max vcs/tactile cues for hand placement , LE placement  To RW ( pt requires assist to prevent posterior loss of balance) ; transfers RW min assist ( KAFO right, BKA prosthesis left); sit to stand X 3 from raised mat with cues as above mod assist; gait 20 feet X 2 min assist once in standing; transfer to [facility] mod assist RW. No hallucinations noted .   1530-1620 (50 minutes) individual Focus of treatment: transfer training; sit to stand ; bed mobility Treatment: Pt very tearful this PM requiring max encouragement to participate. Husband present. Supine to sit with rail min assist with increased time and tactile cues to use bedrail ;scooting forward on bed mod/max assist; sit to stand mod assist with continued max vcs for hand/foot placement and forward trunk lean; stand/turn with RW min assist for balance; sit to stand X 5 from raised mat mod assist with verbal or tactile cues as above; sit to supine mod assist bilateral LEs; scoot up in bed +2 assist; rolling min assist with tactile cues to use bedrail to assist.   Melroy Bougher,JIM 10/01/2012, 7:24 AM

## 2012-10-02 ENCOUNTER — Inpatient Hospital Stay (HOSPITAL_COMMUNITY): Payer: Medicare Other | Admitting: Physical Therapy

## 2012-10-02 ENCOUNTER — Inpatient Hospital Stay (HOSPITAL_COMMUNITY): Payer: Medicare Other | Admitting: Occupational Therapy

## 2012-10-02 ENCOUNTER — Encounter (HOSPITAL_COMMUNITY): Payer: Medicare Other | Admitting: Occupational Therapy

## 2012-10-02 ENCOUNTER — Inpatient Hospital Stay (HOSPITAL_COMMUNITY): Payer: Medicare Other

## 2012-10-02 ENCOUNTER — Inpatient Hospital Stay (HOSPITAL_COMMUNITY): Payer: Medicare Other | Admitting: Speech Pathology

## 2012-10-02 ENCOUNTER — Inpatient Hospital Stay (HOSPITAL_COMMUNITY): Payer: Medicare Other | Admitting: *Deleted

## 2012-10-02 LAB — GLUCOSE, CAPILLARY
Glucose-Capillary: 227 mg/dL — ABNORMAL HIGH (ref 70–99)
Glucose-Capillary: 200 mg/dL — ABNORMAL HIGH (ref 70–99)

## 2012-10-02 LAB — PROTIME-INR
INR: 2.77 — ABNORMAL HIGH (ref 0.00–1.49)
Prothrombin Time: 27.9 seconds — ABNORMAL HIGH (ref 11.6–15.2)

## 2012-10-02 MED ORDER — INSULIN GLARGINE 100 UNIT/ML ~~LOC~~ SOLN
5.0000 [IU] | Freq: Every day | SUBCUTANEOUS | Status: DC
  Administered 2012-10-02 – 2012-10-03 (×2): 5 [IU] via SUBCUTANEOUS

## 2012-10-02 MED ORDER — WARFARIN SODIUM 5 MG PO TABS
5.0000 mg | ORAL_TABLET | Freq: Once | ORAL | Status: AC
  Administered 2012-10-02: 5 mg via ORAL
  Filled 2012-10-02: qty 1

## 2012-10-02 MED ORDER — INSULIN GLARGINE 100 UNIT/ML ~~LOC~~ SOLN: 15.0000 [IU] | Freq: Every day | SUBCUTANEOUS | Status: DC

## 2012-10-02 NOTE — Progress Notes (Signed)
ANTICOAGULATION CONSULT NOTE - Follow Up Consult  Pharmacy Consult for coumadin Indication: afib/CVA/DVT  Allergies  Allergen Reactions  . Percocet (Oxycodone-Acetaminophen) Nausea And Vomiting    Patient Measurements: Height: 5' 1.5" (156.2 cm) Weight: 142 lb 13.7 oz (64.799 kg) IBW/kg (Calculated) : 48.95 Heparin Dosing Weight:   Vital Signs: Temp: 98 F (36.7 C) (02/11 0609) BP: 135/70 mmHg (02/11 0615) Pulse Rate: 66 (02/11 0615)  Labs:  Recent Labs  09/30/12 0650 10/01/12 0715 10/02/12 0635  HGB  --  11.0*  --   HCT  --  33.5*  --   PLT  --  217  --   LABPROT 24.6* 25.0* 27.9*  INR 2.34* 2.39* 2.77*  CREATININE  --  0.82  --     Estimated Creatinine Clearance: 53.3 ml/min (by C-G formula based on Cr of 0.82).   Medications:  Scheduled:  . cefUROXime  500 mg Oral BID WC  . feeding supplement  237 mL Oral TID BM  . fludrocortisone  0.1 mg Oral QODAY  . insulin aspart  1-5 Units Subcutaneous TID WC & HS  . [START ON 10/03/2012] insulin glargine  15 Units Subcutaneous Daily  . insulin glargine  5 Units Subcutaneous QHS  . levothyroxine  150 mcg Oral Custom  . levothyroxine  175 mcg Oral Custom  . mycophenolate  360 mg Oral BID  . pantoprazole  40 mg Oral Daily  . polyethylene glycol  17 g Oral Daily  . potassium chloride  20 mEq Oral BID  . predniSONE  5 mg Oral Daily  . senna  1 tablet Oral BID  . tacrolimus  1 mg Oral QHS  . tacrolimus  2 mg Oral Daily  . traZODone  50 mg Oral QHS  . [COMPLETED] warfarin  5 mg Oral ONCE-1800  . warfarin  5 mg Oral ONCE-1800  . Warfarin - Pharmacist Dosing Inpatient   Does not apply q1800  . [DISCONTINUED] insulin glargine  10 Units Subcutaneous Daily   Infusions:    Assessment: 72 yo female with afib/CVA/DVT is currently on therapeutic coumadin.  INR is up to 2.77 from 2.39. Goal of Therapy:  INR 2-3    Plan:  1) Coumadin 5mg  po x1 2) INR in am.  Alain Deschene, [name] 10/02/2012,8:36 AM

## 2012-10-02 NOTE — Progress Notes (Signed)
This note has been reviewed and this clinician agrees with information provided.  

## 2012-10-02 NOTE — Progress Notes (Signed)
Inpatient Diabetes Program Recommendations  AACE/ADA: New Consensus Statement on Inpatient Glycemic Control (2013)  Target Ranges:  Prepandial:   less than 140 mg/dL      Peak postprandial:   less than 180 mg/dL (1-2 hours)      Critically ill patients:  140 - 180 mg/dL   Reason for Visit: Noted significant increase in Lantus dose  Results for Elizabeth [name], Elizabeth [name] (MRN [id number]) as of 10/02/2012 13:56  Ref. Range 10/01/2012 07:35 10/01/2012 11:27 10/01/2012 16:22 10/01/2012 20:51 10/02/2012 07:16 10/02/2012 11:25  Glucose-Capillary Latest Range: 70-99 mg/dL 846 (H) 962 (H) 952 (H) 287 (H) 227 (H) 275 (H)    Note: See progress note written 09-21-2012 by Diabetes Coordinator Beryl Meager, RN, CDE, while patient admitted in the acute hospital setting.  She had spoken with husband who prepares/gives insulin and manages her diabetes.  He states that she has brittle diabetes and that 1 unit of short acting insulin drops her blood sugar an estimated 75 mg/dl.  Note Hgb A1C of 6.3 which indicates tight control in this lady with multiple health problems and a great risk for hypoglycemia.  Tight control probably contributed to her fall, etc.    Note that her home dose of Lantus 10 units has been increased to 15 units daily with an additional 5 units to be added at HS.  Am concerned that adding this much Lantus at once would possibly cause severe hypoglycemia.  Would suggest that her glycemic control goals be set higher than usual given her risk for hypoglycemia.  If insulin is to be increased, and if patient is eating well, instead of increasing Lantus would suggest adding 1 unit of meal coverage tid in addition to current customized correction scale.  (If patient does not eat at least 50%, meal coverage would not be given.)  Further titration of  meal coverage could be titrated based on CBG's based on trends.  Thank you.  Marqueze Ramcharan S. Elsie Lincoln, RN, CNS, CDE Inpatient Diabetes Program, team pager [phone]

## 2012-10-02 NOTE — Evaluation (Signed)
Recreational Therapy Assessment and Plan  Patient Details  Name: Elizabeth [name] MRN: [id number] Date of Birth: Jan 24, 1941 Today's Date: 10/02/2012  Rehab Potential: Good ELOS: 2-3 weeks   Assessment Clinical Impression:  Problem List:  Patient Active Problem List   Diagnosis   .  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  DIABETES MELLITUS, TYPE II   .  HYPERLIPIDEMIA-MIXED   .  HYPERTENSION, BENIGN   .  CAD, NATIVE VESSEL   .  SINUS BRADYCARDIA   .  GERD   .  Irritable bowel syndrome   .  DIZZINESS   .  CAROTID BRUITS, BILATERAL   .  COLONIC POLYPS, HX OF   .  Edema   .  Atrial fibrillation   .  Encounter for long-term (current) use of anticoagulants   .  CVA (cerebral vascular accident)   .  DVT of upper extremity (deep vein thrombosis)   .  Nocturnal polyuria   .  Constipation   .  Arteriosclerosis, mesenteric artery   .  Orthostatic hypotension   .  Neurogenic pain-esophagus   .  Fall   .  Right orbit fracture   .  Zygoma fracture   .  Fracture of right maxilla   .  Concussion, unspecified   .  Hypoglycemia   .  Encephalopathy   .  UTI (lower urinary tract infection)    Past Medical History:  Past Medical History   Diagnosis  Date   .  Coronary artery disease    .  Chronic kidney disease      s/p cadaveric renal transplant in 2001   .  Diabetes mellitus    .  PVD (peripheral vascular disease)    .  History of orthostatic hypotension    .  Hyperlipidemia    .  Hypothyroidism    .  Anemia    .  Pancreatic cyst    .  Osteoporosis      recurrent fractures   .  Melanoma    .  Carotid bruit      left   .  Adenomatous polyp    .  GERD (gastroesophageal reflux disease)    .  Gastritis    .  CVA (cerebral infarction)    .  Bradycardia    .  Renal artery stenosis    .  Diabetic retinopathy(362.0)    .  Renal osteodystrophy    .  Hyperparathyroidism    .  Leg ulcer      from poor fitting prosthetic   .  H/O  immunosuppressive therapy    .  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  Fx wrist    .  Fx ankle    .  Neuropathy    .  Nephropathy    .  Carotid artery stenosis      mild   .  Stroke    .  Tubular adenoma    .  Colitis    .  IBS (irritable bowel syndrome)    .  Ischemic colitis    .  Arteriosclerosis, mesenteric artery  02/27/2012     Duplex US shows >70% stenosis celiac and signs of stenosis of SMA and IMA   .  Esophageal candidiasis    .  Neurogenic pain-esophagus  09/04/2012    Past Surgical History:  Past Surgical History   Procedure  Laterality  Date   .  Kidney transplant   08/15/2000     [facility] in [place]   .  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  Appendectomy     .  Cataract extraction     .  Cholecystectomy     .  Abdominal hysterectomy     .  Thyroid surgery     .  Tonsillectomy     .  Cesarean section       x 2   .  Vitrectomy       right eye   .  Coronary artery bypass graft     .  Colonoscopy w/ biopsies and polypectomy   02/18/2009     adenomatous polyps, diverticulosis, internal hemorrhoids   .  Eus   02/04/2010     w/FNA, pancreatic cyst   .  Upper gastrointestinal endoscopy   12/01/2009   .  Colonoscopy   01/30/2012     Procedure: COLONOSCOPY; Surgeon: Rachael Fee, MD; Location: [facility] ENDOSCOPY; Service: Endoscopy; Laterality: N/A;   .  Esophagogastroduodenoscopy   05/02/2012     Procedure: ESOPHAGOGASTRODUODENOSCOPY (EGD); Surgeon: Iva Boop, MD; Location: [facility] ENDOSCOPY; Service: Endoscopy; Laterality: N/A;    Assessment & Plan  Clinical Impression: Patient is a 72 y.o. 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 Admitted 09/20/2012 after a fall that was unwitnessed with noted altered mental status. MRI of the  brain with no acute intracranial abnormalities noting areas of remote hemorrhagic encephalomalacia in the right occipital lobe and inferior left cerebellum. CT chest and abdomen negative. CT maxillofacial with fractures through the anterior and lateral walls of the right maxillary sinus and 2 fractures within the mid posterior right zygomatic arch and right orbital wall and advise conservative care per ENT Dr. Chales Salmon. 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 Patient with history of orthostatic hypotension and patient remains on Florinef as prior to hospital admission. Patient remains on chronic Coumadin therapy for atrial fibrillation with no chest pain or shortness of breath. Physical therapy evaluation completed 09/22/2012 limited by cognition. M.D. is requested physical medicine rehabilitation consult to consider inpatient rehabilitation services. Patient was felt to be a candidate for inpatient rehabilitation services and was admitted for a comprehensive rehabilitation program.  Patient transferred to CIR on 09/28/2012.   Pt presents with decreased activity tolerance, decreased functional mobility, decreased balance, decreased vision, right inattention, decreased attention, decreased awareness Limiting pt's independence with leisure/community pursuits.   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;Community - Agricultural consultant (Comment);Sports - Other (Comment);Games - Cross-word;Nature - Flower gardening (tennis) Expression Interests: Music (Comment);Singing;Dance (classical) Other Leisure Interests: Television;Reading;Cooking/Baking (bake with  granddaughters) Leisure Participation Style: Alone;With Family/Friends Awareness of Community Resources: Good-identify 3 post discharge leisure resources Psychosocial / Spiritual Spiritual Interests: Church;Womens'Men's Groups Interested in Spiritual Care Staff Visit: Yes [employer]) Patient agreeable to Pet Therapy: No Does patient have pets?: No Social interaction - Mood/Behavior: Cooperative Film/video editor for Education?: Yes Recreational Therapy Orientation Orientation -Reviewed with patient: Available activity resources Strengths/Weaknesses Patient Strengths/Abilities:  Willingness to participate Patient weaknesses: Physical limitations  Plan Rec Therapy Plan Is patient appropriate for Therapeutic Recreation?: Yes Rehab Potential: Good Treatment times per week: Min 1 time per week >20 minutes Estimated Length of Stay: 2-3 weeks TR Treatment/Interventions: Adaptive equipment instruction;1:1 session;Balance/vestibular training;Functional mobility training;Community reintegration;Cognitive remediation/compensation;Patient/family education;Therapeutic activities;Recreation/leisure participation;Therapeutic exercise;UE/LE Coordination activities  Recommendations for other services: None  Discharge Criteria: Patient will be discharged from TR if patient refuses treatment 3 consecutive times without medical reason.  If treatment goals not met, if there is a change in medical status, if patient makes no progress towards goals or if patient is discharged from hospital.  The above assessment, treatment plan, treatment alternatives and goals were discussed and mutually agreed upon: by patient  Tymarion Everard 10/02/2012, 5:01 PM

## 2012-10-02 NOTE — Progress Notes (Signed)
Physical Therapy Session Note/Vestibular Assessment  Patient Details  Name: Elizabeth [name] MRN: [id number] Date of Birth: 08-11-1941  Today's Date: 10/02/2012 Time: 7829-5621 Time Calculation (min): 61 min  Short Term Goals: Week 1:  PT Short Term Goal 1 (Week 1): Patient will be able to perform bed mobility with S/Mod-i PT Short Term Goal 2 (Week 1): Paitent will be able to perform transfers with min-Assist. PT Short Term Goal 3 (Week 1): Patient will be able to perform gait using RW x 50' with min-Assist.  Skilled Therapeutic Interventions/Progress Updates:    OT consulted with me re: pt's left leg prosthesis which per pt report is almost 74 years old.  She has several layers of socks, a foam type insert and an overlying silicone sleeve helping to keep the prosthesis on her leg.  There seems to be a 1-2 inch gap between the end of her residual limb and her foam insert.  This is complicated by the fact that her residual limb has excess skin that is very mobile on the end of her leg.  She reports that she has been offered surgery to take away this excess skin, but doesn't want to pursue this option.  I agree with OT that it would be a good idea to get a prosthetist in to look at her leg and the fit of her prosthesis to see if there are any changes that can be made to get the leg to fit better.  OT to consult MD during team conference today.    Order for vestibular assessment- initiated today.  Pt with history of visual deficits, eye alignment in static sitting is poor.  Pt reports that she wears glasses for "distance".  Glasses kept on for assessment.  Pt reports fullness in her right ear, occasional ringing in the ears, and increased HA and light sensitivity since she fell.  Crude hearing test revealed decreased hearing in right ear compared to left ear.  Oculomotor testing with difficulty following pen without losing focus.  Head shaking testing in both vertical and horizontal directions  revealed increased symptoms with both directions with horizontal being more provoking.  Attempted modified dix hallpike maneuver, but unable to successfully do testing due to slow speed and management of pt's legs and trunk would take more than one person to maintain the correct position.  BBQ roll testing of horizontal canal left negative, right positive for dizziness and rotational (clockwise) nystagmus.  After symptoms dissipated sat pt back up on edge of mat table mod assist and she reported she could not continue with testing due to nausea and fatigue.  Will like to try traditional Dix hallpike to test the right posterior canal, but will need a second person to assist with this due to pt weakness and immobility.  I predict that she will have + R posterior canal BPPV, but cannot say for sure without testing.  It would be good to go ahead and initiate x1 exercises until testing and treatment can be completed for positional vertigo.    Mobility: transfers WC to mat table with 5-6 pivotal steps with RW mod assist.  Sit to sidelying right mod assist, rolling bil max assist due to no bed rails to pull on in eighter direction.  Side lying to sit from right side mod assist of trunk and bil legs.  Transfer back to [facility] and then from [facility] to recliner in room mod assist to support trunk for balance, cues for when it was safe to sit  down.  Once settled in the recliner pt became nauseated to the point of vomiting. RN made aware.     Therapy Documentation Precautions:  Precautions Precautions: Fall Precaution Comments: Lt BKA Required Braces or Orthoses: Other Brace/Splint Other Brace/Splint: R KAFO, Left prosthesis for BKA Restrictions Weight Bearing Restrictions: No   Vital Signs: Therapy Vitals Temp: 98.2 F (36.8 C) Temp src: Oral Pulse Rate: 64 Resp: 18 BP: 162/66 mmHg Patient Position, if appropriate: Sitting Oxygen Therapy SpO2: 98 % O2 Device: None (Room air) Pain: Pain Assessment Pain  Assessment: Faces Pain Score: Asleep Faces Pain Scale: Hurts a little bit Pain Type: Acute pain Pain Location: Leg Pain Orientation: Left;Distal Pain Intervention(s): Repositioned;RN made aware;Relaxation (Removed prosthesis)   Locomotion : Ambulation Ambulation/Gait Assistance: 3: Mod assist   See FIM for current functional status  Therapy/Group: Individual Therapy  Lurena Joiner B. Micaila Ziemba, PT, DPT [phone]   10/02/2012, 4:17 PM

## 2012-10-02 NOTE — Progress Notes (Signed)
Occupational Therapy Session Note  Patient Details  Name: Elizabeth [name] MRN: [id number] Date of Birth: 20-Mar-1941  Today's Date: 10/02/2012 Time: 1400-1430 Time Calculation (min): 30 min  Short Term Goals: Week 1:  OT Short Term Goal 1 (Week 1): Pt with complete UB bathing with min assist with min cues for initiation OT Short Term Goal 2 (Week 1): Pt will complete LB bathing with mod assist with min cues for initiation OT Short Term Goal 3 (Week 1): Pt will complete toilet transfer with mod assist with necessary AE. OT Short Term Goal 4 (Week 1): Pt will demonstrate recall of prior sessions with mod questioning cues.  Skilled Therapeutic Interventions/Progress Updates:  Pt. In room eating lunch upon arrival. Worked on orientation to situation with pt. Asking "How much of this is real?" Functionally ambulated to BR using RW with Min A for sit to stands and guiding walker through doorways. Performed 2/3 steps of toileting needing assistance with cleaning perineal area. Pt. Complained of pain with R leg; removed prosthesis and left pt. In recliner with call bell in reach.   Therapy Documentation Precautions:  Precautions Precautions: Fall Precaution Comments: Lt BKA Required Braces or Orthoses: Other Brace/Splint (Right KAFO: s/p osteoporotic multiple fx's of R tib-fib ) Other Brace/Splint: R KAFO, Left prosthesis for BKA Restrictions Weight Bearing Restrictions: No Pain: Pain Assessment Pain Assessment: Faces Pain Score: Asleep Faces Pain Scale: Hurts a little bit Pain Type: Acute pain Pain Location: Leg Pain Orientation: Left;Distal Pain Intervention(s): Repositioned;RN made aware;Relaxation (Removed prosthesis)   Therapy/Group: Individual Therapy  Mykelti Goldenstein OTA/S Occupational Therapy Assistant Student 10/02/2012, 3:38 PM

## 2012-10-02 NOTE — Progress Notes (Signed)
Occupational Therapy Session Note  Patient Details  Name: Elizabeth [name] MRN: [id number] Date of Birth: [birth year]  Today's Date: 10/02/2012 Time: [phone]  Time Calculation (min): 60 min  Short Term Goals: Week 1:  OT Short Term Goal 1 (Week 1): Pt with complete UB bathing with min assist with min cues for initiation OT Short Term Goal 2 (Week 1): Pt will complete LB bathing with mod assist with min cues for initiation OT Short Term Goal 3 (Week 1): Pt will complete toilet transfer with mod assist with necessary AE. OT Short Term Goal 4 (Week 1): Pt will demonstrate recall of prior sessions with mod questioning cues.  Skilled Therapeutic Interventions/Progress Updates:    1:1 Pt in bed with prosthesis and KAFO on - reporting she slept in them. Nursing reported pt would not allow night staff doff them "cause they are too slow if i have to go to the bathroom." Self care retraining at shower level with focus on transfer into walk in shower with tub bench with KAFO and prosthesis on and then doffed. Pt required mod cuing to sequence and complete bathing - pt persevered on washing hair over and over. Sit to stands varied from min  A to mod A depending on her ability to perform anterior weight shift over feet. Pt able to demonstrate functional ambulation with RW short distances with min A with occasional A to steer RW. Pt continues to present with visual disturbances with tracking to left, reading and is compounded by vestibular issues.  Therapy Documentation Precautions:  Precautions Precautions: Fall Precaution Comments: Lt BKA Required Braces or Orthoses: Other Brace/Splint (Right KAFO: s/p osteoporotic multiple fx's of R tib-fib ) Other Brace/Splint: R KAFO, Left prosthesis for BKA Restrictions Weight Bearing Restrictions: No Pain: Pain Assessment Pain Assessment: Faces Pain Score: Asleep Faces Pain Scale: Hurts a little bit Pain Type: Acute pain Pain Location: Leg Pain  Orientation: Left;Distal Pain Intervention(s): Repositioned;RN made aware;Relaxation (Removed prosthesis)  See FIM for current functional status  Therapy/Group: Individual Therapy  Roney Mans [facility] 10/02/2012, 3:48 PM

## 2012-10-02 NOTE — Progress Notes (Signed)
Speech Language Pathology Daily Session Notes  Patient Details  Name: Elizabeth [name] MRN: [id number] Date of Birth: 07-09-1941  Today's Date: 10/02/2012  Session 1 Time:0800-0855 Time Calculation (min): 55 min  Session 2 Time: 1330-1400 Time Calculation: 30 min  Short Term Goals: Week 1: SLP Short Term Goal 1 (Week 1): Patient will demonstrate orientation with min assist question cues to utilize external aids. SLP Short Term Goal 2 (Week 1): Patient will demonstrate sustained attention to basic tasks for 2 minutes with min assist verbal cues. SLP Short Term Goal 3 (Week 1): Patient will demonstrate basic problem solving with familiar tasks with mod assist verbal, visual and tactile cues. SLP Short Term Goal 4 (Week 1): Patient will recall daily events/tasks with mod assist verbal and visual cues to utilize external aids.  Skilled Therapeutic Interventions:  Session 1: Treatment focus on cognitive goals. SLP facilitated session with Max A verbal and visual cues to initiate self-feeding with breakfast. Pt also required Max verbal cues for problem solving with tray set-up and for locating/scanning for items on her breakfast tray.  Pt sustained attention to self-feeding task for ~30 minutes with Mod A verbal cues for redirection. Pt verbalized confusion and disorientation and required Max question cues to orient to place and situation and for safety awareness with utilizing the call bell for assistance.    Session 2: Treatment focus on cognitive goals. Pt was eating lunch upon entering room and required Mod verbal cues for functional problem solving with self-feeding. Pt unable to demonstrate alternating attention between functional conversation and meal and required Mod A verbal cues to sustain attention to self-feeding task. Pt required Total A for orientation to place and situation.   FIM:  Comprehension Comprehension: 3-Understands basic 50 - 74% of the time/requires cueing 25 - 50%  of  the time Expression Expression Mode: Verbal Expression: 3-Expresses basic 50 - 74% of the time/requires cueing 25 - 50% of the time. Needs to repeat parts of sentences. Social Interaction Social Interaction: 3-Interacts appropriately 50 - 74% of the time - May be physically or verbally inappropriate. Problem Solving Problem Solving: 2-Solves basic 25 - 49% of the time - needs direction more than half the time to initiate, plan or complete simple activities Memory Memory: 1-Recognizes or recalls less than 25% of the time/requires cueing greater than 75% of the time  Pain 7/10 in right shoulder blades, RN notified and administered pain medications  Therapy/Group: Individual Therapy  Deverick Pruss 10/02/2012, 3:34 PM

## 2012-10-02 NOTE — Progress Notes (Signed)
Subjective/Complaints: Slept better last night it appears. Denies any complaints. A 12 point review of systems has been performed and if not noted above is otherwise negative.   Objective:   Vital Signs: Blood pressure 135/70, pulse 66, temperature 98 F (36.7 C), temperature source Axillary, resp. rate 18, height 5' 1.5" (1.562 m), weight 64.799 kg (142 lb 13.7 oz), SpO2 100.00%. No results found.  Recent Labs  10/01/12 0715  WBC 5.2  HGB 11.0*  HCT 33.5*  PLT 217    Recent Labs  10/01/12 0715  NA 137  K 3.4*  CL 95*  GLUCOSE 287*  BUN 15  CREATININE 0.82  CALCIUM 9.2   CBG (last 3)   Recent Labs  10/01/12 1622 10/01/12 2051 10/02/12 0716  GLUCAP 238* 287* 227*    Wt Readings from Last 3 Encounters:  09/28/12 64.799 kg (142 lb 13.7 oz)  09/27/12 64.8 kg (142 lb 13.7 oz)  09/04/12 66.225 kg (146 lb)    Physical Exam:  .Vitals reviewed.  HENT:  Multiple bruising and bruising to facial area  Eyes: EOM are normal.  Neck: Normal range of motion. Neck supple. No thyromegaly present.  Cardiovascular:  Cardiac rate controlled Pulmonary/Chest: Effort normal and breath sounds normal. No respiratory distress.  Abdominal: Soft. Bowel sounds are normal. She exhibits no distension.  Neurological: She keeps eyes closed. Answers questions. Tells me she's at [place].  Motor strength is 4/5 in bilateral biceps, triceps, wrist flexors and extension and grip as well as hip flexors knee extensors ankle dorsiflexors and plantar flexors  Sensory exam is normal in the upper extremities but appears diminished in the distal right lowe limb.  Has difficulty with left finger nose to finger  Left field cut on confrontation testing. Gaze conjugate.  Wearing AFO right lower ext with movement noted within brace.  Skin: multiple bruises and contusions noted throughout. Left BKA stump intact.    Assessment/Plan: 1. Functional deficits secondary to TBI which require 3+ hours  per day of interdisciplinary therapy in a comprehensive inpatient rehab setting. Physiatrist is providing close team supervision and 24 hour management of active medical problems listed below. Physiatrist and rehab team continue to assess barriers to discharge/monitor patient progress toward functional and medical goals. FIM: FIM - Bathing Bathing: 1: Total-Patient completes 0-2 of 10 parts or less than 25%  FIM - Upper Body Dressing/Undressing Upper body dressing/undressing steps patient completed: Thread/unthread right sleeve of pullover shirt/dresss;Thread/unthread left sleeve of pullover shirt/dress;Put head through opening of pull over shirt/dress;Pull shirt over trunk Upper body dressing/undressing: 5: Set-up assist to: Obtain clothing/put away FIM - Lower Body Dressing/Undressing Lower body dressing/undressing steps patient completed: Fasten/unfasten pants (pt dons skirt over her head and then pull down with S) Lower body dressing/undressing: 4: Min-Patient completed 75 plus % of tasks  FIM - Toileting Toileting steps completed by patient: Performs perineal hygiene Toileting: 2: Max-Patient completed 1 of 3 steps  FIM - Diplomatic Services operational officer Devices: Psychiatrist Transfers: 2-To toilet/BSC: Max A (lift and lower assist);2-From toilet/BSC: Max A (lift and lower assist)  FIM - Bed/Chair Transfer Bed/Chair Transfer: 2: Bed > Chair or W/C: Max A (lift and lower assist) (without kAFO and prosthic )  FIM - Locomotion: Wheelchair Locomotion: Wheelchair: 1: Total Assistance/staff pushes wheelchair (Pt<25%) (staff propels chair for transportation) FIM - Locomotion: Ambulation Locomotion: Ambulation Assistive Devices: Other (comment) (handheld assist) Ambulation/Gait Assistance: 3: Mod assist Locomotion: Ambulation: 1: Travels less than 50 ft with moderate assistance (Pt:  50 - 74%)  Comprehension Comprehension Mode: Auditory Comprehension: 3-Understands  basic 50 - 74% of the time/requires cueing 25 - 50%  of the time  Expression Expression Mode: Verbal Expression: 3-Expresses basic 50 - 74% of the time/requires cueing 25 - 50% of the time. Needs to repeat parts of sentences.  Social Interaction Social Interaction: 2-Interacts appropriately 25 - 49% of time - Needs frequent redirection.  Problem Solving Problem Solving: 2-Solves basic 25 - 49% of the time - needs direction more than half the time to initiate, plan or complete simple activities  Memory Memory: 2-Recognizes or recalls 25 - 49% of the time/requires cueing 51 - 75% of the time  Medical Problem List and Plan:  1. 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 DVT Prophylaxis/Anticoagulation: Chronic Coumadin for a true fibrillation. Monitor for any bleeding episodes  3. Pain Management: Ultram as needed. Pain control fair at present 4. Mood/underlying dementia. Will discuss baseline with husband. Cognition likely a major factor in her falls and gait disorder.  5. Neuropsych: This patient is not capable of making decisions on his/her own behalf.   -working on optimizing sleep (scheduled trazodone last night) to improve day time arousal  -limit neurosedating medcations  -check Thyroid studies 6. Multiple facial fractures. Conservative care per ENT  7. Chronic orthostatic hypotension. Continue Florinef as directed for bp  8. History of CVA. Husband notes premorbid chronic visual problems related to stroke 2012  9. Renal transplant 2001. Prograf/prednisone  10. 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 Will modify the data as needed  11. Diabetes mellitus with peripheral neuropathy. Hemoglobin A1c 6.3. Lantus insulin--titrating upward to effect.. Check blood sugars a.c. and at bedtime  12. Coronary artery disease/CABG. No chest pain or shortness of breath  13. Hypokalemia: supplement and recheck Thursday  LOS (Days) 4 A  FACE TO FACE EVALUATION WAS PERFORMED  Phoenix Riesen T 10/02/2012 8:00 AM

## 2012-10-02 NOTE — Progress Notes (Signed)
Occupational Therapy Note  Patient Details  Name: Elizabeth [name] MRN: [id number] Date of Birth: [birth year] Today's Date: 10/02/2012  Time: 1315-1330 Pt initially denied pain but c/o her teeth hurting later in session;RN aware Individual Therapy Pt seated in w/c upon arrival with lunch sitting on tray to her right.  I asked patient if she wanted to eat and she responded that she was waiting for someone to bring her lunch.  Engaged patient in self feeding and orientation.  Pt required min verbal cues to redirect.  Pt oriented to place and date but required tot A for situation.  Pt requested to call husband and asked therapist to dial phone for her.  Patient able to recall correct phone number for home.  Patient exhibited diffuculty locating phone to her left and required verbal cues to continue searching for phone.  Focus on attention, orientation, and task initiation.   Lavone Neri [facility] 10/02/2012, 2:50 PM

## 2012-10-03 ENCOUNTER — Inpatient Hospital Stay (HOSPITAL_COMMUNITY): Payer: Medicare Other | Admitting: Speech Pathology

## 2012-10-03 ENCOUNTER — Inpatient Hospital Stay (HOSPITAL_COMMUNITY): Payer: Medicare Other | Admitting: Physical Therapy

## 2012-10-03 ENCOUNTER — Inpatient Hospital Stay (HOSPITAL_COMMUNITY): Payer: Medicare Other | Admitting: Occupational Therapy

## 2012-10-03 ENCOUNTER — Encounter (HOSPITAL_COMMUNITY): Payer: Medicare Other | Admitting: Occupational Therapy

## 2012-10-03 DIAGNOSIS — S88119A Complete traumatic amputation at level between knee and ankle, unspecified lower leg, initial encounter: Secondary | ICD-10-CM

## 2012-10-03 DIAGNOSIS — S069X9A Unspecified intracranial injury with loss of consciousness of unspecified duration, initial encounter: Secondary | ICD-10-CM

## 2012-10-03 DIAGNOSIS — E1165 Type 2 diabetes mellitus with hyperglycemia: Secondary | ICD-10-CM

## 2012-10-03 DIAGNOSIS — W19XXXA Unspecified fall, initial encounter: Secondary | ICD-10-CM

## 2012-10-03 DIAGNOSIS — I951 Orthostatic hypotension: Secondary | ICD-10-CM

## 2012-10-03 LAB — T3, FREE: T3, Free: 1.7 pg/mL — ABNORMAL LOW (ref 2.3–4.2)

## 2012-10-03 LAB — GLUCOSE, CAPILLARY
Glucose-Capillary: 87 mg/dL (ref 70–99)
Glucose-Capillary: 89 mg/dL (ref 70–99)

## 2012-10-03 LAB — PROTIME-INR: Prothrombin Time: 28.6 seconds — ABNORMAL HIGH (ref 11.6–15.2)

## 2012-10-03 MED ORDER — ADULT MULTIVITAMIN W/MINERALS CH
1.0000 | ORAL_TABLET | Freq: Every day | ORAL | Status: DC
  Administered 2012-10-03 – 2012-10-12 (×10): 1 via ORAL
  Filled 2012-10-03 (×11): qty 1

## 2012-10-03 MED ORDER — PRO-STAT SUGAR FREE PO LIQD
30.0000 mL | Freq: Two times a day (BID) | ORAL | Status: DC
  Administered 2012-10-03: 30 mL via ORAL
  Filled 2012-10-03 (×5): qty 30

## 2012-10-03 MED ORDER — WARFARIN SODIUM 5 MG PO TABS
5.0000 mg | ORAL_TABLET | Freq: Once | ORAL | Status: AC
  Administered 2012-10-03: 5 mg via ORAL
  Filled 2012-10-03: qty 1

## 2012-10-03 MED ORDER — INSULIN GLARGINE 100 UNIT/ML ~~LOC~~ SOLN
25.0000 [IU] | Freq: Every day | SUBCUTANEOUS | Status: DC
  Administered 2012-10-03: 25 [IU] via SUBCUTANEOUS

## 2012-10-03 NOTE — Progress Notes (Signed)
ANTICOAGULATION CONSULT NOTE - Follow Up Consult  Pharmacy Consult for coumadin Indication: afib/CVA/DVT  Allergies  Allergen Reactions  . Percocet (Oxycodone-Acetaminophen) Nausea And Vomiting    Patient Measurements: Height: 5' 1.5" (156.2 cm) Weight: 142 lb 13.7 oz (64.799 kg) IBW/kg (Calculated) : 48.95 Heparin Dosing Weight:   Vital Signs: Temp: 97.6 F (36.4 C) (02/12 0523) Temp src: Oral (02/12 0523) BP: 170/78 mmHg (02/12 0523) Pulse Rate: 67 (02/12 0523)  Labs:  Recent Labs  10/01/12 0715 10/02/12 0635 10/03/12 0617  HGB 11.0*  --   --   HCT 33.5*  --   --   PLT 217  --   --   LABPROT 25.0* 27.9* 28.6*  INR 2.39* 2.77* 2.87*  CREATININE 0.82  --   --     Estimated Creatinine Clearance: 53.3 ml/min (by C-G formula based on Cr of 0.82).   Medications:  Scheduled:  . cefUROXime  500 mg Oral BID WC  . feeding supplement  237 mL Oral TID BM  . fludrocortisone  0.1 mg Oral QODAY  . insulin aspart  1-5 Units Subcutaneous TID WC & HS  . [START ON 10/04/2012] insulin glargine  25 Units Subcutaneous Daily  . insulin glargine  5 Units Subcutaneous QHS  . levothyroxine  150 mcg Oral Custom  . levothyroxine  175 mcg Oral Custom  . mycophenolate  360 mg Oral BID  . pantoprazole  40 mg Oral Daily  . polyethylene glycol  17 g Oral Daily  . [COMPLETED] potassium chloride  20 mEq Oral BID  . predniSONE  5 mg Oral Daily  . senna  1 tablet Oral BID  . tacrolimus  1 mg Oral QHS  . tacrolimus  2 mg Oral Daily  . traZODone  50 mg Oral QHS  . [COMPLETED] warfarin  5 mg Oral ONCE-1800  . Warfarin - Pharmacist Dosing Inpatient   Does not apply q1800  . [DISCONTINUED] insulin glargine  15 Units Subcutaneous Daily   Infusions:    Assessment: 72 yo female with afib/CVA/DVT is currently on therapeutic coumadin. INR is 2.87. Goal of Therapy:  INR 2-3    Plan:  1) Coumadin 5mg  po x1 2) INR in am  Akilah Cureton, [name] 10/03/2012,8:32 AM

## 2012-10-03 NOTE — Progress Notes (Signed)
Subjective/Complaints: A good night. Alert this am. "where am I" A 12 point review of systems has been performed and if not noted above is otherwise negative.   Objective:   Vital Signs: Blood pressure 170/78, pulse 67, temperature 97.6 F (36.4 C), temperature source Oral, resp. rate 17, height 5' 1.5" (1.562 m), weight 64.799 kg (142 lb 13.7 oz), SpO2 94.00%. No results found.  Recent Labs  10/01/12 0715  WBC 5.2  HGB 11.0*  HCT 33.5*  PLT 217    Recent Labs  10/01/12 0715  NA 137  K 3.4*  CL 95*  GLUCOSE 287*  BUN 15  CREATININE 0.82  CALCIUM 9.2   CBG (last 3)   Recent Labs  10/02/12 1703 10/02/12 2108 10/03/12 0724  GLUCAP 200* 218* 94    Wt Readings from Last 3 Encounters:  09/28/12 64.799 kg (142 lb 13.7 oz)  09/27/12 64.8 kg (142 lb 13.7 oz)  09/04/12 66.225 kg (146 lb)    Physical Exam:  .Vitals reviewed.  HENT:  Multiple bruising and bruising to facial area improving Eyes: EOM are normal.  Neck: Normal range of motion. Neck supple. No thyromegaly present.  Cardiovascular:  Cardiac rate controlled Pulmonary/Chest: Effort normal and breath sounds normal. No respiratory distress.  Abdominal: Soft. Bowel sounds are normal. She exhibits no distension.  Neurological: She keeps eyes closed. Answers questions. Tells me she's at [place].  Motor strength is 4/5 in bilateral biceps, triceps, wrist flexors and extension and grip as well as hip flexors knee extensors ankle dorsiflexors and plantar flexors  Sensory exam is normal in the upper extremities but appears diminished in the distal right lowe limb.  Has difficulty with left finger nose to finger  Left field cut on confrontation testing. Gaze conjugate. Appears to have decreased visual acuity as a whole. Nystagmus with lateral gaze Wearing AFO right lower ext with movement noted within brace.  Skin: multiple bruises and contusions noted throughout. Left BKA stump intact. Has redundant tissue  on the end.   Assessment/Plan: 1. Functional deficits secondary to TBI which require 3+ hours per day of interdisciplinary therapy in a comprehensive inpatient rehab setting. Physiatrist is providing close team supervision and 24 hour management of active medical problems listed below. Physiatrist and rehab team continue to assess barriers to discharge/monitor patient progress toward functional and medical goals. FIM: FIM - Bathing Bathing Steps Patient Completed: Chest;Right Arm;Left Arm;Abdomen;Right upper leg;Left upper leg Bathing: 3: Mod-Patient completes 5-7 48f 10 parts or 50-74%  FIM - Upper Body Dressing/Undressing Upper body dressing/undressing steps patient completed: Thread/unthread right sleeve of pullover shirt/dresss;Thread/unthread left sleeve of pullover shirt/dress;Put head through opening of pull over shirt/dress;Pull shirt over trunk Upper body dressing/undressing: 5: Supervision: Safety issues/verbal cues FIM - Lower Body Dressing/Undressing Lower body dressing/undressing steps patient completed: Fasten/unfasten pants (pt dons skirt over her head and then pull down with S) Lower body dressing/undressing: 4: Min-Patient completed 75 plus % of tasks  FIM - Toileting Toileting steps completed by patient: Performs perineal hygiene;Adjust clothing prior to toileting;Adjust clothing after toileting Toileting: 4: Steadying assist  FIM - Diplomatic Services operational officer Devices: Bedside commode Toilet Transfers: 4-To toilet/BSC: Min A (steadying Pt. > 75%);4-From toilet/BSC: Min A (steadying Pt. > 75%)  FIM - Banker Devices: Walker;Bed rails;Arm rests;Prosthesis;Orthosis Bed/Chair Transfer: 3: Supine > Sit: Mod A (lifting assist/Pt. 50-74%/lift 2 legs;3: Sit > Supine: Mod A (lifting assist/Pt. 50-74%/lift 2 legs);3: Bed > Chair or W/C: Mod A (lift or  lower assist);3: Chair or W/C > Bed: Mod A (lift or lower assist)  FIM -  Locomotion: Wheelchair Locomotion: Wheelchair: 1: Total Assistance/staff pushes wheelchair (Pt<25%) FIM - Locomotion: Ambulation Locomotion: Ambulation Assistive Devices: Designer, industrial/product Ambulation/Gait Assistance: 3: Mod assist Locomotion: Ambulation: 1: Travels less than 50 ft with moderate assistance (Pt: 50 - 74%)  Comprehension Comprehension Mode: Auditory Comprehension: 3-Understands basic 50 - 74% of the time/requires cueing 25 - 50%  of the time  Expression Expression Mode: Verbal Expression: 3-Expresses basic 50 - 74% of the time/requires cueing 25 - 50% of the time. Needs to repeat parts of sentences.  Social Interaction Social Interaction: 3-Interacts appropriately 50 - 74% of the time - May be physically or verbally inappropriate.  Problem Solving Problem Solving: 2-Solves basic 25 - 49% of the time - needs direction more than half the time to initiate, plan or complete simple activities  Memory Memory: 1-Recognizes or recalls less than 25% of the time/requires cueing greater than 75% of the time  Medical Problem List and Plan:  1. 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 DVT Prophylaxis/Anticoagulation: Chronic Coumadin for a true fibrillation. Monitor for any bleeding episodes  3. Pain Management: Ultram as needed. Pain control fair at present 4. Mood/underlying dementia.   Cognition likely a major factor in her falls and gait disorder.   -pt quite alert with me today and carried on a conversation regarding her prosthesis and then proceeded to ask me where she was and in what town. 5. Neuropsych: This patient is not capable of making decisions on his/her own behalf.   -sleep improving with trazodone  -limit neurosedating medcations  - Thyroid studies pending 6. Multiple facial fractures. Conservative care per ENT  7. Chronic orthostatic hypotension. Continue Florinef as directed for bp  8. History of CVA. Husband notes premorbid  chronic visual problems related to stroke 2012  9. Renal transplant 2001. Prograf/prednisone  10. 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   -I have spoken with [facility]. She has preferred to keep the sleeve suspension system as it has been what she is accustomed to. She has had chronic distal redundant tissue which has been a challenge for fitting.  -she was due a new prosthesis before she had her stroke last year.  -Prosthetist will be by to eval current socket and fit. She is wearing a lot of socks currently  11. Diabetes mellitus with peripheral neuropathy. Hemoglobin A1c 6.3. Lantus insulin--further increase today.. Checking blood sugars a.c. and at bedtime  12. Coronary artery disease/CABG. No chest pain or shortness of breath  13. Hypokalemia: supplement and recheck Thursday  LOS (Days) 5 A FACE TO FACE EVALUATION WAS PERFORMED  Betzy Barbier T 10/03/2012 8:08 AM

## 2012-10-03 NOTE — Progress Notes (Signed)
Orthopedic Tech Progress Note Patient Details:  Elizabeth [name] [birth year] [id number]  Patient ID: Elizabeth [name], female   DOB: February 27, 1941, 72 y.o.   MRN: [id number] Called [facility]; spoke with Jeff @1450   Elizabeth [name] 10/03/2012, 2:48 PM

## 2012-10-03 NOTE — Progress Notes (Signed)
Speech Language Pathology Daily Session Notes  Patient Details  Name: Elizabeth [name] MRN: [id number] Date of Birth: 01/09/1941  Today's Date: 10/03/2012  Session 1 Time: 0830-0910 Time Calculation (min): 40 min  Session 2 Time: 4098-1191 Time Calculation: 40 min  Short Term Goals: Week 1: SLP Short Term Goal 1 (Week 1): Patient will demonstrate orientation with min assist question cues to utilize external aids. SLP Short Term Goal 2 (Week 1): Patient will demonstrate sustained attention to basic tasks for 2 minutes with min assist verbal cues. SLP Short Term Goal 3 (Week 1): Patient will demonstrate basic problem solving with familiar tasks with mod assist verbal, visual and tactile cues. SLP Short Term Goal 4 (Week 1): Patient will recall daily events/tasks with mod assist verbal and visual cues to utilize external aids.  Skilled Therapeutic Interventions:  Session 1: Treatment focus on cognitive goals. Pt was independently oriented to place and situation and recalled biographical information with supervision question cues. Pt recalled daily events with supervision question cues.    Session 2:  Treatment focus on cognitive goals. Pt sequenced 4 and 6 step picture cards with Min A question cues to self-monitor and correct errors. Pt also demonstrated functional problem solving for functional tasks such as using a telephone with Min A verbal and question cues.   FIM:  Comprehension Comprehension Mode: Auditory Comprehension: 4-Understands basic 75 - 89% of the time/requires cueing 10 - 24% of the time Expression Expression Mode: Verbal Expression: 4-Expresses basic 75 - 89% of the time/requires cueing 10 - 24% of the time. Needs helper to occlude trach/needs to repeat words. Social Interaction Social Interaction: 4-Interacts appropriately 75 - 89% of the time - Needs redirection for appropriate language or to initiate interaction. Problem Solving Problem Solving: 3-Solves basic  50 - 74% of the time/requires cueing 25 - 49% of the time Memory Memory: 3-Recognizes or recalls 50 - 74% of the time/requires cueing 25 - 49% of the time  Pain Pain Assessment Pain Assessment: No/denies pain  Therapy/Group: Individual Therapy  [name] 10/03/2012, 4:28 PM

## 2012-10-03 NOTE — Progress Notes (Signed)
NUTRITION FOLLOW UP  Intervention:   1. Continue Glucerna Shake po TID 2. Add 30 ml Prostat po BID, each supplement provides 100 kcal and 15 grams protein, to help pt meet increased protein needs given TBI. 3. MVI daily 4. RD to continue to follow nutrition care plan  Nutrition Dx:   Increased nutrient needs related to TBI and pressure ulcer as evidenced by estimated nutrition needs.   Goal:   PO intake to meet >/=90% estimated nutrition needs.  Monitor:   PO intake, weight trends, I/O's, labs  Assessment:   Pt was followed during acute admission for poor po intake and hx of weight loss prior to TBI. Weight up 3 lbs from acute admission weight (139 lbs).   Pt intake improved throughout acute admission per records. Meal intake at this time is ranging from 50 - 100%. Per chart, pt is taking Glucerna Shakes approximately half of the time. Noted per SLP note from yesterday, pt requires max verbal and visual cues to self-feed at meal times.  Diabetes coordinator saw pt on 2/11 for elevated blood sugars: 287 - 227 - 275.  Height: Ht Readings from Last 1 Encounters:  09/28/12 5' 1.5" (1.562 m)    Weight Status:   Wt Readings from Last 1 Encounters:  09/28/12 142 lb 13.7 oz (64.799 kg)    Re-estimated needs:  Kcal: 1600 - 1800 Protein: 98 - 110 grams Fluid: 1.6 - 1.8 liters  Skin: stage II, R heel  Diet Order: Carb Control Medium; 1600 - 2000   Intake/Output Summary (Last 24 hours) at 10/03/12 0921 Last data filed at 10/02/12 1933  Gross per 24 hour  Intake    480 ml  Output      0 ml  Net    480 ml    Last BM: 2/11   Labs:   Recent Labs Lab 09/27/12 0816 10/01/12 0715  NA 137 137  K 3.7 3.4*  CL 98 95*  CO2 31 34*  BUN 13 15  CREATININE 0.64 0.82  CALCIUM 9.5 9.2  GLUCOSE 163* 287*    CBG (last 3)   Recent Labs  10/02/12 1703 10/02/12 2108 10/03/12 0724  GLUCAP 200* 218* 94    Scheduled Meds: . cefUROXime  500 mg Oral BID WC  . feeding  supplement  237 mL Oral TID BM  . fludrocortisone  0.1 mg Oral QODAY  . insulin aspart  1-5 Units Subcutaneous TID WC & HS  . [START ON 10/04/2012] insulin glargine  25 Units Subcutaneous Daily  . insulin glargine  5 Units Subcutaneous QHS  . levothyroxine  150 mcg Oral Custom  . levothyroxine  175 mcg Oral Custom  . mycophenolate  360 mg Oral BID  . pantoprazole  40 mg Oral Daily  . polyethylene glycol  17 g Oral Daily  . predniSONE  5 mg Oral Daily  . senna  1 tablet Oral BID  . tacrolimus  1 mg Oral QHS  . tacrolimus  2 mg Oral Daily  . traZODone  50 mg Oral QHS  . warfarin  5 mg Oral ONCE-1800  . Warfarin - Pharmacist Dosing Inpatient   Does not apply q1800    Continuous Infusions:  none  Jarold Motto MS, RD, LDN Pager: [phone] After-hours pager: [phone]

## 2012-10-03 NOTE — Progress Notes (Signed)
Occupational Therapy Session Note  Patient Details  Name: ANELIS HRIVNAK MRN: [id number] Date of Birth: 1941-06-30  Today's Date: 10/03/2012 Time: 0730-0830 Time Calculation (min): 60 min  Short Term Goals: Week 1:  OT Short Term Goal 1 (Week 1): Pt with complete UB bathing with min assist with min cues for initiation OT Short Term Goal 2 (Week 1): Pt will complete LB bathing with mod assist with min cues for initiation OT Short Term Goal 3 (Week 1): Pt will complete toilet transfer with mod assist with necessary AE. OT Short Term Goal 4 (Week 1): Pt will demonstrate recall of prior sessions with mod questioning cues.  Skilled Therapeutic Interventions/Progress Updates:    1:1 self care retraining at EOB with focus on bed mobility, sitting EOB balance, self correction of posterior lean and falling backwards, activity tolerance, sit to stands, and standing balance, toileting and toilet transfer with extra time to allow her to be min A. Pt with incontinent episode of urine on the way to the bathroom due to urgency. Pt continues to have a left inattention visually requiring min to mod cuing. Trey Paula came to discuss fitting of prothesis and education on proper fitting and outpatient arrangements.   Therapy Documentation Precautions:  Precautions Precautions: Fall Precaution Comments: Lt BKA Required Braces or Orthoses: Other Brace/Splint Other Brace/Splint: R KAFO, Left prosthesis for BKA Restrictions Weight Bearing Restrictions: No General:   Vital Signs: Therapy Vitals Temp: 97.6 F (36.4 C) Temp src: Oral Pulse Rate: 67 Resp: 17 BP: 170/78 mmHg Patient Position, if appropriate: Lying Oxygen Therapy SpO2: 94 % O2 Device: None (Room air) Pain:  no c/o pain just some dizziness with positional changes  See FIM for current functional status  Therapy/Group: Individual Therapy  Roney Mans [facility] 10/03/2012, 8:18 AM

## 2012-10-03 NOTE — Progress Notes (Signed)
Physical Therapy Session Note  Patient Details  Name: Elizabeth [name] MRN: [id number] Date of Birth: 1940-12-27  Today's Date: 10/03/2012 Time: 1005-1053 Time Calculation (min): 48 min  Second Treatment:  13:30-14:30 Time:  60 min  Short Term Goals: Week 1:  PT Short Term Goal 1 (Week 1): Patient will be able to perform bed mobility with S/Mod-i PT Short Term Goal 2 (Week 1): Paitent will be able to perform transfers with min-Assist. PT Short Term Goal 3 (Week 1): Patient will be able to perform gait using RW x 50' with min-Assist.  Skilled Therapeutic Interventions/Progress Updates:   Pt sleeping in recliner and difficult to arouse.  Reported that she was tired and not up for therapy.  Difficult to keep pt with eyes open and reporting dizziness.  Not able to keep eyes open for therapist to assess.  Decided to get pt moving to keep awake and pt agreed.  Stand pivot transfer with max @ due to lifting assist needed for sit to stand.  Gait x 30' with RW with mod@, pt complained of 3/10 dizziness lasting greater than 1 min after walk.  Reports that the room is spinning. After resting 5 min, pt complained of dizziness at 5-6/10, returned to room to allow pt to rest.  Required max @ to transfer w/c to bed with RW and max @ to position self in bed.  Second Session. Pt reporting that she still was tired.  Husband reporting that pt vomited after lunch.  He suggested pt use the NuStep as it was a favorite of hers.  Pt performed Nu Step x on workload =1, x 2, pt needing constant cues to stay on task the second 4 minutes.  Dynamic standing to fold towels with min@.  Pt tolerated for 1 1/2 min before needing to sit down due to back pain.  Sit to stands improved to mod@, pt with difficulty transferring her weight forward and off her heels to gain her balance from a posterior bias. Gait training with RW x 20' x2 mod@. Therapy Documentation Precautions:  Precautions Precautions: Fall Precaution  Comments: Lt BKA Required Braces or Orthoses: Other Brace/Splint Other Brace/Splint: R KAFO, Left prosthesis for BKA Restrictions Weight Bearing Restrictions: No General: Amount of Missed PT Time (min): 12 Minutes Missed Time Reason: Patient fatigue Vital Signs: Therapy Vitals BP: 138/80 mmHg Pain:  C/o of being "achy", particular her jaw, nose, face. See FIM for current functional status  Therapy/Group: Individual Therapy  Georges Mouse 10/03/2012, 12:59 PM

## 2012-10-03 NOTE — Progress Notes (Signed)
[facility] Individual Statement of Services  Patient Name:  Elizabeth [name]  Date:  10/03/2012  Welcome to the [facility].  Our goal is to provide you with an individualized program based on your diagnosis and situation, designed to meet your specific needs.  With this comprehensive rehabilitation program, you will be expected to participate in at least 3 hours of rehabilitation therapies Monday-Friday, with modified therapy programming on the weekends.  Your rehabilitation program will include the following services:  Physical Therapy (PT), Occupational Therapy (OT), Speech Therapy (ST), 24 hour per day rehabilitation nursing, Therapeutic Recreaction (TR), Neuropsychology, Case Management (Social Worker), Rehabilitation Medicine, Nutrition Services and Pharmacy Services  Weekly team conferences will be held on Tuesdays to discuss your progress.  Your  Social Worker will talk with you frequently to get your input and to update you on team discussions.  Team conferences with you and your family in attendance may also be held.  Expected length of stay: 2 weeks   Overall anticipated outcome: supervision to minimal assist          (*target discharge 10/12/12)  Depending on your progress and recovery, your program may change.  Your  Social Worker will coordinate services and will keep you informed of any changes.  Your  Social Worker's name and contact numbers are listed  below.  The following services may also be recommended but are not provided by the [facility]:    Home Health Rehabiltiation Services  Outpatient Rehabilitatation Servives    Arrangements will be made to provide these services after discharge if needed.  Arrangements include referral to agencies that provide these services.  Your insurance has been verified to be:  Medicare and Community education officer Your primary doctor is:  Dr. Rich Reining  Pertinent information will be shared  with your doctor and your insurance company.  Social Worker:  [place], [place] [phone] or [phone]  Information discussed with and copy given to patient by: Amada Jupiter, 10/03/2012, 12:19 PM

## 2012-10-04 ENCOUNTER — Inpatient Hospital Stay (HOSPITAL_COMMUNITY): Payer: Medicare Other | Admitting: Physical Therapy

## 2012-10-04 ENCOUNTER — Inpatient Hospital Stay (HOSPITAL_COMMUNITY): Payer: Medicare Other | Admitting: Speech Pathology

## 2012-10-04 ENCOUNTER — Encounter (HOSPITAL_COMMUNITY): Payer: Medicare Other | Admitting: Occupational Therapy

## 2012-10-04 ENCOUNTER — Inpatient Hospital Stay (HOSPITAL_COMMUNITY): Payer: Medicare Other | Admitting: *Deleted

## 2012-10-04 ENCOUNTER — Inpatient Hospital Stay (HOSPITAL_COMMUNITY): Payer: Medicare Other

## 2012-10-04 LAB — GLUCOSE, CAPILLARY
Glucose-Capillary: 86 mg/dL (ref 70–99)
Glucose-Capillary: 45 mg/dL — ABNORMAL LOW (ref 70–99)
Glucose-Capillary: 115 mg/dL — ABNORMAL HIGH (ref 70–99)
Glucose-Capillary: 88 mg/dL (ref 70–99)

## 2012-10-04 LAB — CBC
Hemoglobin: 11.2 g/dL — ABNORMAL LOW (ref 12.0–15.0)
MCH: 29.6 pg (ref 26.0–34.0)
MCV: 90.7 fL (ref 78.0–100.0)
RBC: 3.78 MIL/uL — ABNORMAL LOW (ref 3.87–5.11)
WBC: 10.5 10*3/uL (ref 4.0–10.5)

## 2012-10-04 LAB — URINALYSIS, ROUTINE W REFLEX MICROSCOPIC
Glucose, UA: NEGATIVE mg/dL
Hgb urine dipstick: NEGATIVE
Protein, ur: 100 mg/dL — AB
Specific Gravity, Urine: 1.017 (ref 1.005–1.030)
Urobilinogen, UA: 0.2 mg/dL (ref 0.0–1.0)

## 2012-10-04 LAB — BASIC METABOLIC PANEL
CO2: 33 mEq/L — ABNORMAL HIGH (ref 19–32)
Calcium: 9.5 mg/dL (ref 8.4–10.5)
Chloride: 98 mEq/L (ref 96–112)
Creatinine, Ser: 0.72 mg/dL (ref 0.50–1.10)
Glucose, Bld: 99 mg/dL (ref 70–99)

## 2012-10-04 LAB — PROTIME-INR
INR: 2.25 — ABNORMAL HIGH (ref 0.00–1.49)
Prothrombin Time: 23.9 seconds — ABNORMAL HIGH (ref 11.6–15.2)

## 2012-10-04 LAB — URINE MICROSCOPIC-ADD ON

## 2012-10-04 MED ORDER — GLUCOSE 40 % PO GEL
1.0000 | ORAL | Status: DC | PRN
  Administered 2012-10-04 – 2012-10-10 (×2): 37.5 g via ORAL
  Filled 2012-10-04: qty 1

## 2012-10-04 MED ORDER — WARFARIN SODIUM 5 MG PO TABS
5.0000 mg | ORAL_TABLET | Freq: Once | ORAL | Status: AC
  Administered 2012-10-04: 5 mg via ORAL
  Filled 2012-10-04: qty 1

## 2012-10-04 MED ORDER — DEXTROSE 50 % IV SOLN
INTRAVENOUS | Status: AC
  Filled 2012-10-04: qty 50

## 2012-10-04 MED ORDER — GLUCOSE 40 % PO GEL
ORAL | Status: AC
  Administered 2012-10-04: 37.5 g via ORAL
  Filled 2012-10-04: qty 1

## 2012-10-04 MED ORDER — DEXTROSE 50 % IV SOLN
25.0000 mL | Freq: Once | INTRAVENOUS | Status: AC | PRN
  Administered 2012-10-04: 17:00:00 via INTRAVENOUS
  Administered 2012-10-05 – 2012-10-06 (×3): 25 mL via INTRAVENOUS

## 2012-10-04 MED ORDER — SODIUM CHLORIDE 0.45 % IV SOLN
INTRAVENOUS | Status: DC
  Administered 2012-10-04: 14:00:00 via INTRAVENOUS

## 2012-10-04 NOTE — Progress Notes (Signed)
Inpatient Diabetes Program Recommendations  AACE/ADA: New Consensus Statement on Inpatient Glycemic Control (2013)  Target Ranges:  Prepandial:   less than 140 mg/dL      Peak postprandial:   less than 180 mg/dL (1-2 hours)      Critically ill patients:  140 - 180 mg/dL   Reason for Visit: CBGs 2/12  [phone] mg/dl       5/18  84/16/606 mg/dl  Inpatient Diabetes Program Recommendations Insulin - Basal: Decrease Lantus to 10-15 units daily if CBGs continue less than 100 mg/dl.  Note: Continue current Novolog correction scale starting with CBG at 200 mg/dl  AC & HS.

## 2012-10-04 NOTE — Progress Notes (Signed)
Hypoglycemic Event  CBG: 56 at 0737  Treatment: 1 tube instant glucose  Symptoms: Nervous/irritable  Follow-up CBG: Time:0820 CBG Result:70  Possible Reasons for Event: Unknown  Comments/MD notified:D. Anguilli PA notified at 0820    Elizabeth [name]  Remember to initiate Hypoglycemia Order Set & complete

## 2012-10-04 NOTE — Progress Notes (Signed)
SLP Cancellation Note  Patient Details Name: Elizabeth [name] MRN: [id number] DOB: 08-Aug-1941   Cancelled treatment:       Pt missed 45 minutes of skilled SLP intervention due to inability to become aroused and inability to participate in treatment. Pt was laying in bed with her eyes closed and was unable to follow basic 1 step commands for functional tasks or answer basic questions. RN, PA and MD are aware.    Monique Hefty 10/04/2012, 9:16 AM

## 2012-10-04 NOTE — Progress Notes (Signed)
ANTICOAGULATION CONSULT NOTE - Follow Up Consult  Pharmacy Consult for coumadin Indication: afib/CVA/DVT  Allergies  Allergen Reactions  . Percocet (Oxycodone-Acetaminophen) Nausea And Vomiting    Patient Measurements: Height: 5' 1.5" (156.2 cm) Weight: 142 lb 13.7 oz (64.799 kg) IBW/kg (Calculated) : 48.95  Vital Signs: Temp: 97.6 F (36.4 C) (02/13 0546) Temp src: Oral (02/13 0546) BP: 119/66 mmHg (02/13 0546) Pulse Rate: 71 (02/13 0546)  Labs:  Recent Labs  10/02/12 0635 10/03/12 0617 10/04/12 0607  LABPROT 27.9* 28.6* 23.9*  INR 2.77* 2.87* 2.25*    Estimated Creatinine Clearance: 53.3 ml/min (by C-G formula based on Cr of 0.82).   Medications:  Scheduled:  . cefUROXime  500 mg Oral BID WC  . feeding supplement  237 mL Oral TID BM  . feeding supplement  30 mL Oral BID  . fludrocortisone  0.1 mg Oral QODAY  . insulin aspart  1-5 Units Subcutaneous TID WC & HS  . insulin glargine  25 Units Subcutaneous Daily  . levothyroxine  150 mcg Oral Custom  . levothyroxine  175 mcg Oral Custom  . multivitamin with minerals  1 tablet Oral Daily  . mycophenolate  360 mg Oral BID  . pantoprazole  40 mg Oral Daily  . predniSONE  5 mg Oral Daily  . tacrolimus  1 mg Oral QHS  . tacrolimus  2 mg Oral Daily  . traZODone  50 mg Oral QHS  . [COMPLETED] warfarin  5 mg Oral ONCE-1800  . Warfarin - Pharmacist Dosing Inpatient   Does not apply q1800  . [DISCONTINUED] insulin glargine  5 Units Subcutaneous QHS  . [DISCONTINUED] polyethylene glycol  17 g Oral Daily  . [DISCONTINUED] senna  1 tablet Oral BID    Assessment: 72 yo female with afib/CVA/DVT is currently on therapeutic coumadin. INR is 2.25. Goal of Therapy:  INR 2-3  Plan:  1) Coumadin 5mg  po x1 2) INR in am  Bayard Hugger, PharmD, BCPS  Clinical Pharmacist  Pager: [phone]   10/04/2012,9:19 AM

## 2012-10-04 NOTE — Progress Notes (Signed)
Patient noted with hypoglycemia this am and irritable when staff attempted to treat hypoglycemic episode . Patient incontinent of bowel and bladder  . Loose bm's x 3 this am. Patient restless and continued to be incontinent . Bladder scanned patient for < 500cc. In and out catherized patient for 600cc this am . Dr . Riley Kill aware of patient refusing to eat and increased confusion . Continue with plan of care.  Cleotilde Neer

## 2012-10-04 NOTE — Progress Notes (Signed)
Patient lethargic throughout day response to painful stimuli to right bka only . Patient more alert at 1800 and ate less than 1/2 chocolate pudding for dinner . Patient noted chewing pills coumadin and ceftin . Urine sample obtained and sent to lab. Patient incontinent of urine this pm . Patient resting at this  Time .  Continue with plan of care.   Cleotilde Neer

## 2012-10-04 NOTE — Progress Notes (Signed)
Hypoglycemic Event  CBG: 45 at 1710  Treatment: D50 IV 25 mL  12.38ml given   Symptoms: None  Follow-up CBG: Time:1744 CBG Result:115  Possible Reasons for Event: Inadequate meal intake  Comments/MD notified:P. Love , PA    Oletha Cruel S  Remember to initiate Hypoglycemia Order Set & complete

## 2012-10-04 NOTE — Progress Notes (Signed)
Physical Therapy Session Note  Patient Details  Name: Elizabeth [name] MRN: [id number] Date of Birth: 09-Mar-1941  Today's Date: 10/04/2012 Time:Session #1:  0933-1000, Session #2: 4098-1191 Time Calculation (min): Session #1: 27 min, Session #2: 17 min   Short Term Goals: Week 1:  PT Short Term Goal 1 (Week 1): Patient will be able to perform bed mobility with S/Mod-i PT Short Term Goal 2 (Week 1): Paitent will be able to perform transfers with min-Assist. PT Short Term Goal 3 (Week 1): Patient will be able to perform gait using RW x 50' with min-Assist.  Skilled Therapeutic Interventions/Progress Updates:    Session #1: RN reports blood sugars ar now back up in the 120s.  Pt asleep, only responding to painful sternal stimuli.  "Ouch!" swatting at therapist.  Donned R KAFO and left prosthesis, pt seemed to be helping lift her legs. Supine to sit max assist with HOB maximally elevated.  Assist of both trunk and bil legs, although with eyes closed pt using right arm to pull up to sitting. Squat pivot transfer from bed to [facility] with RW total assist.  Pt lightly holding onto RW (it actually was getting in the way more than helping with transfer).  Pt seated on very edge of WC, total assist to weight shift to scoot back. Any movement of right leg elicited a pain response (grimicing), but at rest seemed comfortable.  With loud shouting, sternal rubs, noxious stimuli pt would arouse briefly, but the go back to sleeping.  Eyes closed entire time.  Unable to proceede with therapy, so secured her comfortably in [facility] with safety belt.  RN made aware.  Missed 33 mins.  Spoke with RN after session and she reports multiple episodes of incontinence of urine this AM- scanned bladder with 600 cc of urine retention.  Question something medical going on vs dementia?  Session #2: RN in room, saw that CT was ordered as well as urinalysis and IV fluids.  RN reports CT was negative for changes and we are awaiting cultures.   She reports no significant changes in arousal since this AM.  Pt stirs with noxious stimiuli (sternal rub), generally swats at therapist hand, but will not open eyes and after sternal rub stops goes back to not responding to verbal stimuli.  PROM preformed bil legs and arms: ankle PF, DF (right only), knee flexion/hip flexion, hip abduction, bil shoulder flexion, elbow flexion/ext, shoulder IR/ER.  Missed 43 mins of therapy.    Therapy Documentation Precautions:  Precautions Precautions: Fall Precaution Comments: Lt BKA Required Braces or Orthoses: Other Brace/Splint Other Brace/Splint: R KAFO, Left prosthesis for BKA Restrictions Weight Bearing Restrictions: No General: Amount of Missed PT Time (min): 33 Minutes Missed Time Reason: Patient fatigue   Pain: Pain Assessment Pain Assessment: Faces Faces Pain Scale: Hurts little more Pain Type: Chronic pain Pain Location: Leg Pain Orientation: Right Pain Intervention(s): Repositioned;Ambulation/increased activity (pain with movement of right leg)  See FIM for current functional status  Therapy/Group: Individual Therapy  Lurena Joiner B. Marcelyn Ruppe, PT, DPT [phone]   10/04/2012, 10:08 AM

## 2012-10-04 NOTE — Progress Notes (Signed)
Occupational Therapy Note  Patient Details  Name: Elizabeth [name] MRN: [id number] Date of Birth: [birth year] Today's Date: 10/04/2012  Pt missed 60 min of therapy due to pt's unresponsiveness and inability to participate. Pt not verbalizing or opening her eyes. MD, PA and RN aware. Attempted activity for 20 min but no success.     Roney Mans [facility] 10/04/2012, 1:59 PM

## 2012-10-04 NOTE — Progress Notes (Signed)
Subjective/Complaints: More lethargic today. Low cbg (56). Restless. Keeping eyes closed A 12 point review of systems has been performed and if not noted above is otherwise negative.   Objective:   Vital Signs: Blood pressure 119/66, pulse 71, temperature 97.6 F (36.4 C), temperature source Oral, resp. rate 17, height 5' 1.5" (1.562 m), weight 64.799 kg (142 lb 13.7 oz), SpO2 100.00%. No results found. No results found for this basename: WBC, HGB, HCT, PLT,  in the last 72 hours No results found for this basename: NA, K, CL, CO, GLUCOSE, BUN, CREATININE, CALCIUM,  in the last 72 hours CBG (last 3)   Recent Labs  10/03/12 2035 10/04/12 0737 10/04/12 0820  GLUCAP 89 56* 70    Wt Readings from Last 3 Encounters:  09/28/12 64.799 kg (142 lb 13.7 oz)  09/27/12 64.8 kg (142 lb 13.7 oz)  09/04/12 66.225 kg (146 lb)    Physical Exam:  .Vitals reviewed.  HENT:  Multiple bruising and bruising to facial area improving Eyes: EOM are normal.  Neck: Normal range of motion. Neck supple. No thyromegaly present.  Cardiovascular:  Cardiac rate controlled Pulmonary/Chest: Effort normal and breath sounds normal. No respiratory distress.  Abdominal: Soft. Bowel sounds are normal. She exhibits no distension.  Neurological: She keeps eyes closed. Won't answer questions. Follows some simple commands only.  Motor strength is 4/5 in bilateral biceps, triceps, wrist flexors and extension and grip as well as hip flexors knee extensors ankle dorsiflexors and plantar flexors  Sensory exam is normal in the upper extremities but appears diminished in the distal right lowe limb.  Has difficulty with left finger nose to finger  Left field cut on  Gaze conjugate. Appears to have decreased visual acuity as a whole.  Wearing AFO right lower ext with movement noted within brace.  Skin: multiple bruises and contusions noted throughout. Left BKA stump intact. Has redundant tissue on the  end.   Assessment/Plan: 1. Functional deficits secondary to TBI which require 3+ hours per day of interdisciplinary therapy in a comprehensive inpatient rehab setting. Physiatrist is providing close team supervision and 24 hour management of active medical problems listed below. Physiatrist and rehab team continue to assess barriers to discharge/monitor patient progress toward functional and medical goals. FIM: FIM - Bathing Bathing Steps Patient Completed: Chest;Right Arm;Left Arm;Abdomen;Right upper leg;Left upper leg;Front perineal area Bathing: 3: Mod-Patient completes 5-7 36f 10 parts or 50-74%  FIM - Upper Body Dressing/Undressing Upper body dressing/undressing steps patient completed: Thread/unthread right sleeve of pullover shirt/dresss;Thread/unthread left sleeve of pullover shirt/dress;Put head through opening of pull over shirt/dress;Pull shirt over trunk;Thread/unthread right bra strap;Thread/unthread left bra strap Upper body dressing/undressing: 4: Steadying assist FIM - Lower Body Dressing/Undressing Lower body dressing/undressing steps patient completed: Fasten/unfasten pants (pt dons skirt over her head and then pull down with S) Lower body dressing/undressing: 4: Min-Patient completed 75 plus % of tasks  FIM - Toileting Toileting steps completed by patient: Performs perineal hygiene;Adjust clothing prior to toileting;Adjust clothing after toileting Toileting: 4: Steadying assist  FIM - Diplomatic Services operational officer Devices: Bedside commode Toilet Transfers: 4-To toilet/BSC: Min A (steadying Pt. > 75%);4-From toilet/BSC: Min A (steadying Pt. > 75%)  FIM - Bed/Chair Transfer Bed/Chair Transfer Assistive Devices: Therapist, occupational: 2: Sit > Supine: Max A (lifting assist/Pt. 25-49%);2: Bed > Chair or W/C: Max A (lift and lower assist)  FIM - Locomotion: Wheelchair Locomotion: Wheelchair: 1: Total Assistance/staff pushes wheelchair (Pt<25%) FIM -  Locomotion: Ambulation Locomotion: Ambulation Assistive Devices:  Walker - Rolling Ambulation/Gait Assistance: 3: Mod assist Locomotion: Ambulation: 1: Travels less than 50 ft with moderate assistance (Pt: 50 - 74%)  Comprehension Comprehension Mode: Auditory Comprehension: 4-Understands basic 75 - 89% of the time/requires cueing 10 - 24% of the time  Expression Expression Mode: Verbal Expression: 4-Expresses basic 75 - 89% of the time/requires cueing 10 - 24% of the time. Needs helper to occlude trach/needs to repeat words.  Social Interaction Social Interaction: 4-Interacts appropriately 75 - 89% of the time - Needs redirection for appropriate language or to initiate interaction.  Problem Solving Problem Solving: 3-Solves basic 50 - 74% of the time/requires cueing 25 - 49% of the time  Memory Memory: 3-Recognizes or recalls 50 - 74% of the time/requires cueing 25 - 49% of the time  Medical Problem List and Plan:  1. 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 DVT Prophylaxis/Anticoagulation: Chronic Coumadin for a true fibrillation. Monitor for any bleeding episodes  3. Pain Management: Ultram as needed. Pain control fair at present 4. Mood/underlying dementia.   Cognition likely a major factor in her falls and gait disorder.   -pt quite alert with me today and carried on a conversation regarding her prosthesis and then proceeded to ask me where she was and in what town. 5. Neuropsych: This patient is not capable of making decisions on his/her own behalf.   -sleep improving with trazodone in general  -had been doing better until this am  -check labs, restore blood sugar.  -consider repeat imaging depending on course and test results  -limit neurosedating medcations  - Thyroid studies slightly low but don't need to be treated 6. Multiple facial fractures. Conservative care per ENT  7. Chronic orthostatic hypotension. Continue Florinef as directed for  bp  8. History of CVA. Husband notes premorbid chronic visual problems related to stroke 2012  9. Renal transplant 2001. Prograf/prednisone  10. 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   -I have spoken with [facility]. She has preferred to keep the sleeve suspension system as it has been what she is accustomed to. She has had chronic distal redundant tissue which has been a challenge for fitting.  -she was due a new prosthesis before she had her stroke last year.  -Prosthetist will be by to eval current socket and fit. She is wearing a lot of socks currently  11. Diabetes mellitus with peripheral neuropathy. Hemoglobin A1c 6.3. Lantus insulin--decrease given am hypoglycemia   -continue checking blood sugars a.c. and at bedtime  12. Coronary artery disease/CABG. No chest pain or shortness of breath  13. Hypokalemia: supplement and recheck today  LOS (Days) 6 A FACE TO FACE EVALUATION WAS PERFORMED  Skye Rodarte T 10/04/2012 8:59 AM

## 2012-10-04 NOTE — Progress Notes (Signed)
Pt still restless and slow to respond. Labs unremarkable. AVSS. Will recheck urine and head CT today. Begin IVF until taking in more by mouth. Hold lantus insulin. Check AM cortisol tomorrow. Proceed with therapy as tolerated today.  Ranelle Oyster, MD, [place]

## 2012-10-04 NOTE — Progress Notes (Signed)
Social Work Patient ID: Elizabeth [name], female   DOB: Feb 16, 1941, 73 y.o.   MRN: [id number]  Met yesterday with patient and husband to review team conference.  Both aware and agreeable with targeted d/c date of 10/12/12 @ supervision goals overall.  Pt much more alert yesterday and husband pleased with progress.  Unfortunately, pt not very alert today.  Hopeful this will reverse.  Continue to follow for support and d/c planning.  Caidence Higashi, LCSW

## 2012-10-04 NOTE — Progress Notes (Signed)
Occupational Therapy Session Note  Patient Details  Name: Elizabeth [name] MRN: [id number] Date of Birth: 1941/08/04  Today's Date: 10/04/2012 Time:  - 0800-0850  (50 mins)    Short Term Goals: Week 1:  OT Short Term Goal 1 (Week 1): Pt with complete UB bathing with min assist with min cues for initiation OT Short Term Goal 2 (Week 1): Pt will complete LB bathing with mod assist with min cues for initiation OT Short Term Goal 3 (Week 1): Pt will complete toilet transfer with mod assist with necessary AE. OT Short Term Goal 4 (Week 1): Pt will demonstrate recall of prior sessions with mod questioning cues.  Skilled Therapeutic Interventions/Progress Updates:    Pt. Lying in bed upon OT arrival.  CNA attempting to cath pt. But pt not relaxing to abduct legs.  Assisted with procedure.  Cathed 600 cc urine.  Blood sugar= 56.  Gave pt sips of diet coke.  She kept eyes and mouth closed.  Would respond to pain, but not answering any questions. She only stated that her legs hurt.   Pt. Washed face for 20 seconds.  Called PA, Jesusita Oka.  MD ordering lab work.  Attempted to feed pt but she would not open mouth.  Left pt in chair position in bed with speech therapy present.    Therapy Documentation Precautions:  Precautions Precautions: Fall Precaution Comments: Lt BKA Required Braces or Orthoses: Other Brace/Splint Other Brace/Splint: R KAFO, Left prosthesis for BKA Restrictions Weight Bearing Restrictions: No Vital Signs: O2 Device: None (Room air) Pain:  Both leg pain. No number given     See FIM for current functional status  Therapy/Group: Individual Therapy  Humberto Seals 10/04/2012, 8:53 AM

## 2012-10-04 NOTE — Patient Care Conference (Signed)
Inpatient RehabilitationTeam Conference and Plan of Care Update Date: 10/02/2012   Time: 9:51 AM    Patient Name: Elizabeth [name]      Medical Record Number: [id number]  Date of Birth: [birth year] Sex: Female         Room/Bed: 4025/4025-01 Payor Info: Payor: MEDICARE  Plan: MEDICARE PART A AND B  Product Type: *No Product type*     Admitting Diagnosis: TBI  old bka  old cva  Admit Date/Time:  09/28/2012  3:35 PM Admission Comments: No comment available   Primary Diagnosis:  TBI (traumatic brain injury) Principal Problem: TBI (traumatic brain injury)  Patient Active Problem List   Diagnosis Date Noted  . TBI (traumatic brain injury) 09/30/2012  . UTI (lower urinary tract infection) 09/27/2012  . Hypoglycemia 09/26/2012  . Encephalopathy 09/26/2012  . Fall 09/21/2012  . Right orbit fracture 09/21/2012  . Zygoma fracture 09/21/2012  . Fracture of right maxilla 09/21/2012  . Concussion, unspecified 09/21/2012  . 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 Orthostatic hypotension 06/06/2012  . 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 Constipation 10/11/2011  . Nocturnal polyuria 03/31/2011  . Atrial fibrillation 01/28/2011  . Encounter for long-term (current) use of anticoagulants 01/28/2011  . CVA (cerebral vascular accident) 01/28/2011  . DVT of upper extremity (deep vein thrombosis) 01/28/2011  . Edema 01/10/2011  . SINUS BRADYCARDIA 12/14/2009  . GERD 12/08/2009  . DIZZINESS 05/20/2009  . DIABETES MELLITUS, TYPE II 02/11/2009  . HYPERLIPIDEMIA-MIXED 02/09/2009  . HYPERTENSION, BENIGN 02/09/2009  . CAD, NATIVE VESSEL 02/09/2009  . CAROTID BRUITS, BILATERAL 02/09/2009  . 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 Irritable bowel syndrome 08/18/2008  . COLONIC POLYPS, HX OF 08/18/2008    Expected Discharge Date: Expected Discharge Date: 10/12/12  Team Members Present: Physician leading conference: Dr. Faith Rogue Social Worker Present: Amada Jupiter, LCSW Nurse Present: Carmie End, RN PT Present: Reggy Eye, PT OT Present: Roney Mans, OT SLP Present: Feliberto Gottron, SLP Other (Discipline and Name): Tora Duck, PPS Coordinator     Current Status/Progress Goal Weekly Team Focus  Medical   tbi, hx of left bka, chronic gait disorder  improve cognitive and arousal status  maximize sleep wake   Bowel/Bladder   continent bowel and bladder urgency noted at times with occassional incontinent episodes due to mobility from prothesis for left bka . lbm 2-11  min assist with toileting   toilet every 2-3 hours    Swallow/Nutrition/ Hydration             ADL's   min to mod A  supervision to min A  cognition, attention, working memory, standing balance, sit to stands, sequencing    Mobility   max @ bed mobility, max@ overall with transfers, gait x 30' with mod@  supervision bed mobility, transfers, and gait x 50'  Transfers and gait   Communication             Safety/Cognition/ Behavioral Observations  Max A  Min A  safety awareness, problem solving, working memory, emergent awareness    Pain   n/a  3 or less  monitor pain and medicate as needed   Skin   bruising to right face , eye ,flank , leg and foot . right heel with moist skin  allevyn intact   no new breakdown   educate on skin care     Rehab Goals Patient on target to meet rehab goals: Yes *See Interdisciplinary Assessment and Plan and  progress notes for long and short-term goals  Barriers to Discharge: premorbid deficits, pain    Possible Resolutions to Barriers:  adaptive techniques, NMR, family ed    Discharge Planning/Teaching Needs:  Home with husband to resume 24/7 assistance      Team Discussion:  Much better today with alertness.  May request orthotics to look at prosthesis for possible adjustments.  Hopeful can reach supervision goals.  Revisions to Treatment Plan:  None at this time.   Continued Need for  Acute Rehabilitation Level of Care: The patient requires daily medical management by a physician with specialized training in physical medicine and rehabilitation for the following conditions: Daily direction of a multidisciplinary physical rehabilitation program to ensure safe treatment while eliciting the highest outcome that is of practical value to the patient.: Yes Daily medical management of patient stability for increased activity during participation in an intensive rehabilitation regime.: Yes Daily analysis of laboratory values and/or radiology reports with any subsequent need for medication adjustment of medical intervention for : Neurological problems (pain mgt, prosthetic and orthotic needs)  [name] 10/04/2012, 9:51 AM

## 2012-10-04 NOTE — Progress Notes (Signed)
hypoHypoglycemic Event  CBG: 70  Treatment: 15 GM carbohydrate snack refused to eat breakfast   Symptoms: Nervous/irritable  Follow-up CBG: Time:120 CBG Result:0909  Possible Reasons for Event: Inadequate meal intake  Comments/MD notified:D. Anguilli PA notified patient refused to eat breakfast    Elizabeth [name]  Remember to initiate Hypoglycemia Order Set & complete

## 2012-10-04 NOTE — Progress Notes (Signed)
NUTRITION FOLLOW UP  Intervention:   1. Continue Glucerna Shake po TID 2. Discontinue 30 ml Prostat po BID, each supplement provides 100 kcal and 15 grams protein. 3. RD to continue to follow nutrition care plan  Nutrition Dx:   Increased nutrient needs related to TBI and pressure ulcer as evidenced by estimated nutrition needs. Ongoing.  Goal:   PO intake to meet >/=90% estimated nutrition needs. Unmet.  Monitor:   PO intake, weight trends, I/O's, labs  Assessment:   Pt was followed during acute admission for poor po intake and hx of weight loss prior to TBI. Weight up 3 lbs from acute admission weight (139 lbs).   RN asked this RD to discontinue pt's Prostat as it made her nauseous yesterday. Pt will take Glucerna Shakes occasionally per RN. Will continue that order to help support variable po intake.  Nurse notes that pt is more lethargic today and was unable to participate in medication administration.  Height: Ht Readings from Last 1 Encounters:  09/28/12 5' 1.5" (1.562 m)    Weight Status:  No new weight Wt Readings from Last 1 Encounters:  09/28/12 142 lb 13.7 oz (64.799 kg)    Re-estimated needs:  Kcal: 1600 - 1800 Protein: 98 - 110 grams Fluid: 1.6 - 1.8 liters  Skin: stage II, R heel  Diet Order: Carb Control Medium; 1600 - 2000   Intake/Output Summary (Last 24 hours) at 10/04/12 1058 Last data filed at 10/04/12 0730  Gross per 24 hour  Intake    360 ml  Output    600 ml  Net   -240 ml    Last BM: 2/11   Labs:   Recent Labs Lab 10/01/12 0715  NA 137  K 3.4*  CL 95*  CO2 34*  BUN 15  CREATININE 0.82  CALCIUM 9.2  GLUCOSE 287*    CBG (last 3)   Recent Labs  10/04/12 0737 10/04/12 0820 10/04/12 0909  GLUCAP 56* 70 120*    Scheduled Meds: . cefUROXime  500 mg Oral BID WC  . feeding supplement  237 mL Oral TID BM  . feeding supplement  30 mL Oral BID  . fludrocortisone  0.1 mg Oral QODAY  . insulin aspart  1-5 Units  Subcutaneous TID WC & HS  . insulin glargine  25 Units Subcutaneous Daily  . levothyroxine  150 mcg Oral Custom  . levothyroxine  175 mcg Oral Custom  . multivitamin with minerals  1 tablet Oral Daily  . mycophenolate  360 mg Oral BID  . pantoprazole  40 mg Oral Daily  . predniSONE  5 mg Oral Daily  . tacrolimus  1 mg Oral QHS  . tacrolimus  2 mg Oral Daily  . warfarin  5 mg Oral ONCE-1800  . Warfarin - Pharmacist Dosing Inpatient   Does not apply q1800    Continuous Infusions:  none  Jarold Motto MS, RD, LDN Pager: [phone] After-hours pager: [phone]

## 2012-10-04 NOTE — Progress Notes (Signed)
[name] love notified of U/A results, no intervention per PA. Pt in no distress. Will continue to monitor pt

## 2012-10-05 ENCOUNTER — Inpatient Hospital Stay (HOSPITAL_COMMUNITY): Payer: Medicare Other | Admitting: *Deleted

## 2012-10-05 ENCOUNTER — Inpatient Hospital Stay (HOSPITAL_COMMUNITY): Payer: Medicare Other | Admitting: Physical Therapy

## 2012-10-05 ENCOUNTER — Encounter (HOSPITAL_COMMUNITY): Payer: Medicare Other | Admitting: Occupational Therapy

## 2012-10-05 ENCOUNTER — Inpatient Hospital Stay (HOSPITAL_COMMUNITY): Payer: Medicare Other | Admitting: Speech Pathology

## 2012-10-05 LAB — GLUCOSE, CAPILLARY
Glucose-Capillary: 39 mg/dL — CL (ref 70–99)
Glucose-Capillary: 156 mg/dL — ABNORMAL HIGH (ref 70–99)
Glucose-Capillary: 108 mg/dL — ABNORMAL HIGH (ref 70–99)
Glucose-Capillary: 402 mg/dL — ABNORMAL HIGH (ref 70–99)
Glucose-Capillary: 403 mg/dL — ABNORMAL HIGH (ref 70–99)

## 2012-10-05 LAB — BASIC METABOLIC PANEL
CO2: 27 mEq/L (ref 19–32)
Chloride: 94 mEq/L — ABNORMAL LOW (ref 96–112)
Glucose, Bld: 437 mg/dL — ABNORMAL HIGH (ref 70–99)
Potassium: 4.4 mEq/L (ref 3.5–5.1)
Sodium: 129 mEq/L — ABNORMAL LOW (ref 135–145)

## 2012-10-05 LAB — PROTIME-INR
INR: 2.72 — ABNORMAL HIGH (ref 0.00–1.49)
Prothrombin Time: 27.5 seconds — ABNORMAL HIGH (ref 11.6–15.2)

## 2012-10-05 MED ORDER — WARFARIN SODIUM 5 MG PO TABS
5.0000 mg | ORAL_TABLET | Freq: Every day | ORAL | Status: DC
  Administered 2012-10-05 – 2012-10-07 (×3): 5 mg via ORAL
  Filled 2012-10-05 (×4): qty 1

## 2012-10-05 MED ORDER — INSULIN ASPART 100 UNIT/ML ~~LOC~~ SOLN
7.0000 [IU] | Freq: Once | SUBCUTANEOUS | Status: AC
  Administered 2012-10-05: 7 [IU] via SUBCUTANEOUS

## 2012-10-05 MED ORDER — QUETIAPINE FUMARATE 25 MG PO TABS
25.0000 mg | ORAL_TABLET | Freq: Two times a day (BID) | ORAL | Status: DC | PRN
  Administered 2012-10-07 – 2012-10-11 (×2): 25 mg via ORAL
  Filled 2012-10-05 (×3): qty 1

## 2012-10-05 MED ORDER — QUETIAPINE FUMARATE 50 MG PO TABS
50.0000 mg | ORAL_TABLET | Freq: Every day | ORAL | Status: DC
  Administered 2012-10-05 – 2012-10-08 (×4): 50 mg via ORAL
  Filled 2012-10-05 (×5): qty 1

## 2012-10-05 MED ORDER — DEXTROSE 50 % IV SOLN
INTRAVENOUS | Status: AC
  Administered 2012-10-05: 25 mL via INTRAVENOUS
  Filled 2012-10-05: qty 50

## 2012-10-05 NOTE — Progress Notes (Signed)
Patient more alert and able to let needs be known . Patient more cooperative with nursing staff this pm . Patient ate greater than 50 % for husband . Patient continent of moderate soft bm this pm . Patient able to transfer with mod assist . Patient complained this am of right leg pain which is now resolved . Patient currently sitting up in recliner with quick release belt on . Patient continues to receive 1/2 ns at 50cc/hour. Continue with plan of care .      Cleotilde Neer

## 2012-10-05 NOTE — Progress Notes (Signed)
Speech Language Pathology Daily Session Notes  Patient Details  Name: Elizabeth [name] MRN: [id number] Date of Birth: 01/03/1941  Today's Date: 10/05/2012  Session 1 Time: 1030-1045 Time Calculation: 15 min  Session 2 Time: 1440-1500 Time Calculation (min): 20 min  Short Term Goals: Week 1: SLP Short Term Goal 1 (Week 1): Patient will demonstrate orientation with min assist question cues to utilize external aids. SLP Short Term Goal 2 (Week 1): Patient will demonstrate sustained attention to basic tasks for 2 minutes with min assist verbal cues. SLP Short Term Goal 3 (Week 1): Patient will demonstrate basic problem solving with familiar tasks with mod assist verbal, visual and tactile cues. SLP Short Term Goal 4 (Week 1): Patient will recall daily events/tasks with mod assist verbal and visual cues to utilize external aids.  Skilled Therapeutic Interventions:  Session 1: Treatment focus on cognitive goals. Pt awake in bed and screaming out for assistance upon entering the room. Pt required Max A verbal and visual cues for scanning to locate items. Pt required Mod A question and contextual cues for orientation to place, time and situation. Pt falling asleep throughout the session and required Max A to keep her eyes open. RN notified and reported pt did not sleep last night. Session was ended 15 mins early.   Session 2: Treatment focus on cognitive goals. Pt required Max A verbal, tactile and visual cues for basic problem solving with self-care tasks. Pt demonstrated increased confusion and was easily distractible and required Max A verbal cues to sustain attention to task.    FIM:  Comprehension Comprehension Mode: Auditory Comprehension: 2-Understands basic 25 - 49% of the time/requires cueing 51 - 75% of the time Expression Expression Mode: Verbal Expression: 2-Expresses basic 25 - 49% of the time/requires cueing 50 - 75% of the time. Uses single words/gestures. Social  Interaction Social Interaction: 3-Interacts appropriately 50 - 74% of the time - May be physically or verbally inappropriate. Problem Solving Problem Solving: 2-Solves basic 25 - 49% of the time - needs direction more than half the time to initiate, plan or complete simple activities Memory Memory: 2-Recognizes or recalls 25 - 49% of the time/requires cueing 51 - 75% of the time  Pain Pain Assessment Pain Assessment: No/denies pain  Therapy/Group: Individual Therapy  Furqan Gosselin 10/05/2012, 4:15 PM

## 2012-10-05 NOTE — Progress Notes (Signed)
Pt blood sugar 39; [name] Love PA notified; 1 amp of D50 given via IV for hypoglycemia. Will reassess blood sugar. Will continue to monitor pt

## 2012-10-05 NOTE — Progress Notes (Signed)
Occupational Therapy Session Note  Patient Details  Name: Elizabeth [name] MRN: [id number] Date of Birth: 10-07-1940  Today's Date: 10/05/2012 Time: 1100-1130 Time Calculation (min): 30 min  Short Term Goals: Week 1:  OT Short Term Goal 1 (Week 1): Pt with complete UB bathing with min assist with min cues for initiation OT Short Term Goal 2 (Week 1): Pt will complete LB bathing with mod assist with min cues for initiation OT Short Term Goal 3 (Week 1): Pt will complete toilet transfer with mod assist with necessary AE. OT Short Term Goal 4 (Week 1): Pt will demonstrate recall of prior sessions with mod questioning cues.  Skilled Therapeutic Interventions/Progress Updates:    1:1 Pt in bed when arrived with eyes closed. Pt kept eyes closed but would verbally respond to me when touched and asked a questions which is an improvement from yesterday (no response). Tried to engage pt in simple grooming tasks to alert and use focused attention.  Pt unable to demonstrate sustained attention and remain alert. RN stated pt had just returned to bed and had not slept the night before at all. Ended session early due to fatigue and unable to continue to participate in activity.   Therapy Documentation Precautions:  Precautions Precautions: Fall Precaution Comments: Lt BKA Required Braces or Orthoses: Other Brace/Splint Other Brace/Splint: R KAFO, Left prosthesis for BKA Restrictions Weight Bearing Restrictions: No General: General Amount of Missed OT Time (min): 30 Minutes Pain:  sore in bilateral LEs, RN aware  See FIM for current functional status  Therapy/Group: Individual Therapy  Roney Mans [facility] 10/05/2012, 3:46 PM

## 2012-10-05 NOTE — Progress Notes (Signed)
Subjective/Complaints: cbg's still low. Confused and restless most of night. A 12 point review of systems has been performed and if not noted above is otherwise negative.   Objective:   Vital Signs: Blood pressure 121/82, pulse 62, temperature 98.2 F (36.8 C), temperature source Oral, resp. rate 16, height 5' 1.5" (1.562 m), weight 64.799 kg (142 lb 13.7 oz), SpO2 94.00%. Ct Head Wo Contrast  10/04/2012  *RADIOLOGY REPORT*  Clinical Data: Post fall  CT HEAD WITHOUT CONTRAST  Technique:  Contiguous axial images were obtained from the base of the skull through the vertex without contrast.  Comparison: 09/21/2012  Findings: Generalized atrophy. Stable ventricular morphology. No midline shift or mass effect. Old infarcts identified in left cerebellar hemisphere and in right occipital lobe, unchanged. Mild small vessel chronic ischemic changes of deep cerebral white matter. No intracranial hemorrhage, mass lesion or evidence of acute infarction. No extra-axial fluid collections. Extensive atherosclerotic calcifications at skull base. No acute bone or sinus abnormalities.  IMPRESSION: Atrophy with small vessel chronic ischemic changes of deep cerebral white matter. Old left cerebellar and right occipital infarct. No acute intracranial abnormalities.   Original Report Authenticated By: Ulyses Southward, M.D.     Recent Labs  10/04/12 1011  WBC 10.5  HGB 11.2*  HCT 34.3*  PLT 245    Recent Labs  10/04/12 1011  NA 139  K 4.0  CL 98  GLUCOSE 99  BUN 14  CREATININE 0.72  CALCIUM 9.5   CBG (last 3)   Recent Labs  10/05/12 0207 10/05/12 0237 10/05/12 0723  GLUCAP 39* 156* 108*    Wt Readings from Last 3 Encounters:  09/28/12 64.799 kg (142 lb 13.7 oz)  09/27/12 64.8 kg (142 lb 13.7 oz)  09/04/12 66.225 kg (146 lb)    Physical Exam:  .Vitals reviewed.  HENT:  Multiple bruising and bruising to facial area improving Eyes: EOM are normal.  Neck: Normal range of motion. Neck supple.  No thyromegaly present.  Cardiovascular:  Cardiac rate controlled Pulmonary/Chest: Effort normal and breath sounds normal. No respiratory distress.  Abdominal: Soft. Bowel sounds are normal. She exhibits no distension.  Neurological: awake, confused, restless, delusional  Motor strength is 4/5 in bilateral biceps, triceps, wrist flexors and extension and grip as well as hip flexors knee extensors ankle dorsiflexors and plantar flexors  Sensory exam is normal in the upper extremities but appears diminished in the distal right lowe limb.  Has difficulty with left finger nose to finger  Left field cut on  Gaze conjugate. Appears to have decreased visual acuity as a whole.  Wearing AFO right lower ext with movement noted within brace.  Skin: multiple bruises and contusions noted throughout. Left BKA stump intact. Has redundant tissue on the end.   Assessment/Plan: 1. Functional deficits secondary to TBI which require 3+ hours per day of interdisciplinary therapy in a comprehensive inpatient rehab setting. Physiatrist is providing close team supervision and 24 hour management of active medical problems listed below. Physiatrist and rehab team continue to assess barriers to discharge/monitor patient progress toward functional and medical goals. FIM: FIM - Bathing Bathing Steps Patient Completed: Chest;Right Arm;Left Arm;Abdomen;Right upper leg;Left upper leg;Front perineal area Bathing: 0: Activity did not occur  FIM - Upper Body Dressing/Undressing Upper body dressing/undressing steps patient completed: Thread/unthread right sleeve of pullover shirt/dresss;Thread/unthread left sleeve of pullover shirt/dress;Put head through opening of pull over shirt/dress;Pull shirt over trunk;Thread/unthread right bra strap;Thread/unthread left bra strap Upper body dressing/undressing: 4: Steadying assist FIM - Lower  Body Dressing/Undressing Lower body dressing/undressing steps patient completed:  Fasten/unfasten pants (pt dons skirt over her head and then pull down with S) Lower body dressing/undressing: 0: Activity did not occur  FIM - Toileting Toileting steps completed by patient: Performs perineal hygiene;Adjust clothing prior to toileting;Adjust clothing after toileting Toileting: 1: Total-Patient completed zero steps, helper did all 3  FIM - Diplomatic Services operational officer Devices: Bedside commode Toilet Transfers: 2-From toilet/BSC: Max A (lift and lower assist);2-To toilet/BSC: Max A (lift and lower assist)  FIM - Banker Devices: Walker;Prosthesis;Orthosis Bed/Chair Transfer: 2: Chair or W/C > Bed: Max A (lift and lower assist);2: Bed > Chair or W/C: Max A (lift and lower assist);2: Sit > Supine: Max A (lifting assist/Pt. 25-49%);2: Supine > Sit: Max A (lifting assist/Pt. 25-49%)  FIM - Locomotion: Wheelchair Locomotion: Wheelchair: 0: Activity did not occur FIM - Locomotion: Ambulation Locomotion: Ambulation Assistive Devices: Designer, industrial/product Ambulation/Gait Assistance: 3: Mod assist Locomotion: Ambulation: 0: Activity did not occur  Comprehension Comprehension Mode: Auditory Comprehension: 2-Understands basic 25 - 49% of the time/requires cueing 51 - 75% of the time  Expression Expression Mode: Verbal Expression: 1-Expresses basis less than 25% of the time/requires cueing greater than 75% of the time.  Social Interaction Social Interaction: 4-Interacts appropriately 75 - 89% of the time - Needs redirection for appropriate language or to initiate interaction.  Problem Solving Problem Solving: 3-Solves basic 50 - 74% of the time/requires cueing 25 - 49% of the time  Memory Memory: 3-Recognizes or recalls 50 - 74% of the time/requires cueing 25 - 49% of the time  Medical Problem List and Plan:  1. 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 DVT Prophylaxis/Anticoagulation:  Chronic Coumadin for a true fibrillation. Monitor for any bleeding episodes  3. Pain Management: Ultram as needed. Pain control fair at present 4. Mood/underlying dementia.   Cognition likely a major factor in her falls and gait disorder.   -pt quite alert with me today and carried on a conversation regarding her prosthesis and then proceeded to ask me where she was and in what town. 5. Neuropsych/acute deliriumThis patient is not capable of making decisions on his/her own behalf.   -significant hypoglycemia certainly playing a roll  -need to maintain sleep schedule (did worse when trazodone was held)  -lab work and head CT unremarkable, AM cortisol pending  - Thyroid studies slightly low but don't need to be treated  -will add low dose seroquel to assist with sleep and agitation  -ucx pending 6. Multiple facial fractures. Conservative care per ENT  7. Chronic orthostatic hypotension. Continue Florinef as directed for bp  8. History of CVA. Husband notes premorbid chronic visual problems related to stroke 2012  9. Renal transplant 2001. Prograf/prednisone  10. 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   -I have spoken with [facility]. She has preferred to keep the sleeve suspension system as it has been what she is accustomed to. She has had chronic distal redundant tissue which has been a challenge for fitting.  -she was due a new prosthesis before she had her stroke last year.  -Prosthetist will be by to eval current socket and fit. She is wearing a lot of socks currently  11. Diabetes mellitus with peripheral neuropathy. Hemoglobin A1c 6.3. Lantus insulin-held at this point due to hypoglycemia/poor po intake  -continue IVF for now   -continue checking blood sugars a.c. and at bedtime  12. Coronary  artery disease/CABG. No chest pain or shortness of breath  13. Hypokalemia: supplemented  LOS (Days) 7 A FACE TO FACE EVALUATION WAS  PERFORMED  SWARTZ,ZACHARY T 10/05/2012 9:05 AM

## 2012-10-05 NOTE — Progress Notes (Signed)
Orthopedic Tech Progress Note Patient Details:  Elizabeth [name] 01/18/1941 [id number] Delivered abd binder to pt's nurse. Ortho Devices Type of Ortho Device: Abdominal binder Ortho Device/Splint Interventions: Fuller Mandril 10/05/2012, 11:43 AM

## 2012-10-05 NOTE — Progress Notes (Signed)
Physical Therapy Weekly Progress Note  Patient Details  Name: Elizabeth [name] MRN: [id number] Date of Birth: Jan 28, 1941  Today's Date: 10/05/2012 Time:Session #1: 9147-8295, Session #2:  6213-0865 Time Calculation (min): Session #1: 50 min, Session #2: 51 min  Patient has met 0 of 3 short term goals.  Pt has had some medical issues over the past two days which are limiting her physical and cognitive abilities during her PT sessions.    Patient continues to demonstrate the following deficits: decreased strength, decreased balance, decreased safety awareness, difficulty walking, decreased activity tolerance and endurance and therefore will continue to benefit from skilled PT intervention to enhance overall performance with activity tolerance, balance, functional use of  right lower extremity and left lower extremity and awareness.  Patient progressing toward long term goals..  Continue plan of care.  Seek Week #2 goals  PT Short Term Goals Week 1:  PT Short Term Goal 1 (Week 1): Patient will be able to perform bed mobility with S/Mod-i PT Short Term Goal 1 - Progress (Week 1): Not met PT Short Term Goal 2 (Week 1): Paitent will be able to perform transfers with min-Assist. PT Short Term Goal 2 - Progress (Week 1): Progressing toward goal PT Short Term Goal 3 (Week 1): Patient will be able to perform gait using RW x 50' with min-Assist. PT Short Term Goal 3 - Progress (Week 1): Not met  Skilled Therapeutic Interventions/Progress Updates:    Session #1: This session focused on transfers- recliner to [facility] mod assist with RW.  Pt sat on BSC reporting that she needed to sit down (as she was already seated).  Needed constant reinforcement that she was seated, safe and could try to urinate.  Pt was never able to urinate.  Walked from [facility] to bed ~8' with RW min assist.  Encouragement to keep going, keep hands on RW and that she was not falling.  Pt shouting out "I'm falling!  Don't let me fall!"  Pt  not falling, but due to dizziness may have the sensation that she is falling.  Sit to supine mod assist to help with both legs.  Rolling in bed multiple times for clean up and for in and out cath to check bladder.    Session #2: This session focused on bed mobility supine to sit multiple times due to pt wanted to keep lying back due to dizziness upon sitting.  Mod assist to get to sitting.  Pt pulling on therapist with both arms to boost trunk up to upright position.   Then she needed min assist to stay sitting with eyes shut.  Sit to stand and stand pivot to [facility] mod assist.  Gait with RW from [facility] to bathroom 15' min assist once standing.  Pt continues to think she is falling with gait, especially when entering small ramped slope into bathroom.  ST came in at end of session and helped with toileting and positioning pt back into WC.    Therapy Documentation Precautions:  Precautions Precautions: Fall Precaution Comments: Lt BKA Required Braces or Orthoses: Other Brace/Splint Other Brace/Splint: R KAFO, Left prosthesis for BKA Restrictions Weight Bearing Restrictions: No   Locomotion : Ambulation Ambulation/Gait Assistance: 4: Min assist   See FIM for current functional status  Therapy/Group: Individual Therapy  Lurena Joiner B. Kinnick Maus, PT, DPT [phone]   10/05/2012, 3:38 PM

## 2012-10-05 NOTE — Progress Notes (Signed)
Occupational Therapy Note  Patient Details  Name: Elizabeth [name] MRN: [id number] Date of Birth: 30-Nov-1940 Today's Date: 10/05/2012  Time:  1500-1545  (45 min) Individual session Pain:  Back  4/10:   Pt. Sitting in day room with husband.  Agreed to to to gym and address UE AROM, AAROM, mobilization, strength, and transfers.  Pt. Transferred to mat with mod assist.  Instructed on UE exercises using PNF patterns. Throughout session pt had trouble hearing others and seeing on left side.  Pt. Had statements like she thought someone was beside her.   Pt.  Complained of back pain and wanted to lie down after exercising.  Transferred pt back to wc with stand pivot transfer and mod assist.  Pt taken to room.  Left in room with husband present and seat belt on.     Humberto Seals 10/05/2012, 4:46 PM

## 2012-10-05 NOTE — Progress Notes (Signed)
Pt vomiting frothy vomit with productive cough. Pt VS= B/P=190/63 O2 sats at 89% RA. Suction equipment at bedside, 2L [place] available but pt refuses. RR notified. Marissa Nestle PA notified. Will continue to monitor pt

## 2012-10-05 NOTE — Progress Notes (Signed)
ANTICOAGULATION CONSULT NOTE - Follow Up Consult  Pharmacy Consult for Warfarin Indication: Hx Afib/CVA/DVT  Allergies  Allergen Reactions  . Percocet (Oxycodone-Acetaminophen) Nausea And Vomiting    Patient Measurements: Height: 5' 1.5" (156.2 cm) Weight: 142 lb 13.7 oz (64.799 kg) IBW/kg (Calculated) : 48.95  Vital Signs: Temp: 98.2 F (36.8 C) (02/14 0515) Temp src: Oral (02/14 0515) BP: 121/82 mmHg (02/14 0515) Pulse Rate: 62 (02/14 0515)  Labs:  Recent Labs  10/03/12 0617 10/04/12 0607 10/04/12 1011 10/05/12 0645  HGB  --   --  11.2*  --   HCT  --   --  34.3*  --   PLT  --   --  245  --   LABPROT 28.6* 23.9*  --  27.5*  INR 2.87* 2.25*  --  2.72*  CREATININE  --   --  0.72  --     Estimated Creatinine Clearance: 54.7 ml/min (by C-G formula based on Cr of 0.72).   Medications:  Scheduled:  . cefUROXime  500 mg Oral BID WC  . [COMPLETED] dextrose      . feeding supplement  237 mL Oral TID BM  . fludrocortisone  0.1 mg Oral QODAY  . insulin aspart  1-5 Units Subcutaneous TID WC & HS  . levothyroxine  150 mcg Oral Custom  . levothyroxine  175 mcg Oral Custom  . multivitamin with minerals  1 tablet Oral Daily  . mycophenolate  360 mg Oral BID  . pantoprazole  40 mg Oral Daily  . predniSONE  5 mg Oral Daily  . QUEtiapine  50 mg Oral QHS  . tacrolimus  1 mg Oral QHS  . tacrolimus  2 mg Oral Daily  . [COMPLETED] warfarin  5 mg Oral ONCE-1800  . Warfarin - Pharmacist Dosing Inpatient   Does not apply q1800  . [DISCONTINUED] insulin glargine  25 Units Subcutaneous Daily    Assessment: 72 y.o. F who continues on warfarin for hx Afib/CVA/DVT with a therapeutic INR this morning (INR 2.72 << 2.25, goal of 2-3). Hgb/Hct/Plt stable from 2/13 -- no s/sx of bleeding noted.   The patient has been therapeutic on warfarin 5 mg daily for ~1 week now. Will go ahead and schedule this to be a daily dose and will change PT/INR checks to be on MWF -- will plan to write  notes at that time.  Goal of Therapy:  INR 2-3  Plan:  1. Warfarin 5 mg daily 2. Will adjust PT/INR checks to be on MWF for now -- may check off this schedule if deemed clinically necessary (i.e. Change in medications that may increase/decrease warfarin sensitivity) 3. Will continue to monitor for any signs/symptoms of bleeding and will follow up with PT/INR on 2/17.  Georgina Pillion, PharmD, BCPS Clinical Pharmacist Pager: [phone] 10/05/2012 11:54 AM

## 2012-10-06 ENCOUNTER — Inpatient Hospital Stay (HOSPITAL_COMMUNITY): Payer: Medicare Other | Admitting: Physical Therapy

## 2012-10-06 LAB — GLUCOSE, CAPILLARY
Glucose-Capillary: 239 mg/dL — ABNORMAL HIGH (ref 70–99)
Glucose-Capillary: 355 mg/dL — ABNORMAL HIGH (ref 70–99)
Glucose-Capillary: 380 mg/dL — ABNORMAL HIGH (ref 70–99)
Glucose-Capillary: 49 mg/dL — ABNORMAL LOW (ref 70–99)
Glucose-Capillary: 57 mg/dL — ABNORMAL LOW (ref 70–99)
Glucose-Capillary: 87 mg/dL (ref 70–99)

## 2012-10-06 LAB — URINE CULTURE

## 2012-10-06 MED ORDER — DEXTROSE 50 % IV SOLN
INTRAVENOUS | Status: AC
  Administered 2012-10-06: 25 mL via INTRAVENOUS
  Filled 2012-10-06: qty 50

## 2012-10-06 NOTE — Progress Notes (Signed)
Physical Therapy Session Note  Patient Details  Name: Elizabeth [name] MRN: [id number] Date of Birth: 09/12/1940  Today's Date: 10/06/2012 Time: 1100-1200 Time Calculation (min): 60 min  Therapy Documentation Precautions:  Precautions: Fall Precaution Comments: Lt BKA Required Braces or Orthoses: Other Brace/Splint Other Brace/Splint: R KAFO, Left prosthesis for BKA Restrictions Weight Bearing Restrictions: No Pain: Pain Assessment: 0-10 Pain Score:   5 Pain Location: ttp R heel Pain Descriptors: sharp Pain Frequency: Occasional Pain Intervention(s): Medication (See eMAR)  Therapeutic Activity:(45') Bed mobility S/min-A supine to sit and Mod-A to scoot to EOB, Transfer sit<->stand using RW with Mod-A and patient not standing fully upright.  Sit<->Stand with Handheld assist/Min-A  Gait Training:(15') Handheld assist/Min-A 4 x 5 feet (Patient needing to urinate very soon and nursing available to place bed pan on recliner seat with PT assisting with transfer sit<->stand).   (applied Left prosthesis and Right KAFO prior to Treatment Session) Patient has difficulties with seeing well and does better with full lights on and glasses on.  Patient with probable visual  (ie; "what's in that jar over there?"  "It's red.  Is it protein?") and audio hallucinations due to answering or talking to someone who wasn't in room.  See FIM for current functional status  Therapy/Group: Individual Therapy  Dalessandro Baldyga J 10/06/2012, 11:12 AM

## 2012-10-06 NOTE — Progress Notes (Signed)
Hypoglycemic Event  CBG: 57  Treatment: D50 IV 25 mL  Symptoms:  none  Follow-up CBG: Time:832 CBG Result:136  Possible Reasons for Event: Inadequate meal intake  Comments/MD notified:dr. plotnikov    Elizabeth [name]  Remember to initiate Hypoglycemia Order Set & complete

## 2012-10-06 NOTE — Progress Notes (Signed)
Elizabeth [name] is a 72 y.o. female Apr 27, 1941 [id number]  Subjective: No new complaints. Occasional low sugar episodes. Slept well. Feeling OK.  Objective: Vital signs in last 24 hours: Temp:  [97.3 F (36.3 C)-97.9 F (36.6 C)] 97.3 F (36.3 C) (02/15 0450) Pulse Rate:  [55-56] 56 (02/15 0450) Resp:  [16] 16 (02/15 0450) BP: (107-109)/(42-68) 109/68 mmHg (02/15 0450) SpO2:  [96 %-98 %] 96 % (02/15 0450) Weight:  [134 lb 4.2 oz (60.9 kg)] 134 lb 4.2 oz (60.9 kg) (02/14 1929) Weight change:  Last BM Date: 10/05/12  Intake/Output from previous day: 02/14 0701 - 02/15 0700 In: 190 [P.O.:190] Out: -  Last cbgs: CBG (last 3)   Recent Labs  10/06/12 0421 10/06/12 0752 10/06/12 0832  GLUCAP 122* 57* 136*     Physical Exam General: No apparent distress   Appears chronically ill. R face/neck ecchymoses - better  HEENT: moist mucosa Lungs: Normal effort. Lungs clear to auscultation, no crackles or wheezes. Cardiovascular: Regular rate and rhythm, no edema Musculoskeletal:  No change from before Neurological: No new neurological deficits Wounds: N/A    Skin: clear Alert, tired, cooperative   Lab Results: BMET    Component Value Date/Time   NA 129* 10/05/2012 2302   K 4.4 10/05/2012 2302   CL 94* 10/05/2012 2302   CO2 27 10/05/2012 2302   GLUCOSE 437* 10/05/2012 2302   BUN 16 10/05/2012 2302   CREATININE 0.89 10/05/2012 2302   CALCIUM 8.9 10/05/2012 2302   GFRNONAA 63* 10/05/2012 2302   GFRAA 73* 10/05/2012 2302   CBC    Component Value Date/Time   WBC 10.5 10/04/2012 1011   RBC 3.78* 10/04/2012 1011   HGB 11.2* 10/04/2012 1011   HCT 34.3* 10/04/2012 1011   PLT 245 10/04/2012 1011   MCV 90.7 10/04/2012 1011   MCH 29.6 10/04/2012 1011   MCHC 32.7 10/04/2012 1011   RDW 14.0 10/04/2012 1011   LYMPHSABS 0.9 10/01/2012 0715   MONOABS 0.6 10/01/2012 0715   EOSABS 0.1 10/01/2012 0715   BASOSABS 0.0 10/01/2012 0715    Studies/Results: Ct Head Wo Contrast  10/04/2012   *RADIOLOGY REPORT*  Clinical Data: Post fall  CT HEAD WITHOUT CONTRAST  Technique:  Contiguous axial images were obtained from the base of the skull through the vertex without contrast.  Comparison: 09/21/2012  Findings: Generalized atrophy. Stable ventricular morphology. No midline shift or mass effect. Old infarcts identified in left cerebellar hemisphere and in right occipital lobe, unchanged. Mild small vessel chronic ischemic changes of deep cerebral white matter. No intracranial hemorrhage, mass lesion or evidence of acute infarction. No extra-axial fluid collections. Extensive atherosclerotic calcifications at skull base. No acute bone or sinus abnormalities.  IMPRESSION: Atrophy with small vessel chronic ischemic changes of deep cerebral white matter. Old left cerebellar and right occipital infarct. No acute intracranial abnormalities.   Original Report Authenticated By: Ulyses Southward, M.D.     Medications: I have reviewed the patient's current medications.  Assessment/Plan:  1. 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 DVT Prophylaxis/Anticoagulation: Chronic Coumadin for a true fibrillation. Monitor for any bleeding episodes  3. Pain Management: Ultram as needed. Pain control fair at present  4. Mood/underlying dementia. Cognition likely a major factor in her falls and gait disorder.  -pt quite alert with me today and carried on a conversation regarding her prosthesis and then proceeded to ask me where she was and in what town.  5. Neuropsych/acute deliriumThis patient is not capable  of making decisions on his/her own behalf.  -significant hypoglycemia certainly playing a roll  -need to maintain sleep schedule (did worse when trazodone was held)  -lab work and head CT unremarkable, AM cortisol pending  - Thyroid studies slightly low but don't need to be treated  -will add low dose seroquel to assist with sleep and agitation  -ucx pending  6. Multiple facial  fractures. Conservative care per ENT  7. Chronic orthostatic hypotension. Continue Florinef as directed for bp  8. History of CVA. Husband notes premorbid chronic visual problems related to stroke 2012  9. Renal transplant 2001. Prograf/prednisone  10. 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  -I have spoken with [facility]. She has preferred to keep the sleeve suspension system as it has been what she is accustomed to. She has had chronic distal redundant tissue which has been a challenge for fitting.  -she was due a new prosthesis before she had her stroke last year.  -Prosthetist will be by to eval current socket and fit. She is wearing a lot of socks currently  11. Diabetes mellitus with peripheral neuropathy. Hemoglobin A1c 6.3. Lantus insulin-held at this point due to hypoglycemia/poor po intake -  Will adjust Rx -continue IVF for now  -continue checking blood sugars a.c. and at bedtime  12. Coronary artery disease/CABG. No chest pain or shortness of breath  13. Hypokalemia: supplemented        Length of stay, days: 8  Sonda Primes , MD 10/06/2012, 9:17 AM

## 2012-10-06 NOTE — Progress Notes (Signed)
Pt cool and clammy; blood sugar 49; Dr. Posey Rea notified. 1amp D 50 given. Will continue to monitor pt.

## 2012-10-07 ENCOUNTER — Inpatient Hospital Stay (HOSPITAL_COMMUNITY): Payer: Medicare Other | Admitting: *Deleted

## 2012-10-07 LAB — GLUCOSE, CAPILLARY
Glucose-Capillary: 326 mg/dL — ABNORMAL HIGH (ref 70–99)
Glucose-Capillary: 483 mg/dL — ABNORMAL HIGH (ref 70–99)

## 2012-10-07 LAB — GLUCOSE, RANDOM: Glucose, Bld: 524 mg/dL — ABNORMAL HIGH (ref 70–99)

## 2012-10-07 MED ORDER — INSULIN GLARGINE 100 UNIT/ML ~~LOC~~ SOLN
10.0000 [IU] | Freq: Every day | SUBCUTANEOUS | Status: DC
  Administered 2012-10-07: 10 [IU] via SUBCUTANEOUS

## 2012-10-07 MED ORDER — INSULIN ASPART 100 UNIT/ML ~~LOC~~ SOLN
10.0000 [IU] | Freq: Once | SUBCUTANEOUS | Status: AC
  Administered 2012-10-07: 10 [IU] via SUBCUTANEOUS

## 2012-10-07 NOTE — Progress Notes (Signed)
Occupational Therapy Note  Patient Details  Name: Elizabeth [name] MRN: [id number] Date of Birth: [birth year] Today's Date: 10/07/2012  Time:  0900-1000 Individual session Pain:  None  Pt. Sitting on side of bed yelling she has to go to bathroom.  Assisted pt to don prosthesis and right brace.  Pt. Transferred to wc with mod assist.  Nursing reported she had just gone to bathroom prior to OT arrival, but sometimes forgets that she has gone.   After getting pt. In wc, she stated she did not have to go to bathroom.  Took pt to sink where she washed upper body and dressed shirt with supervision and verbal cues for orientation of garments; max assist with depends and shirt.  Stood patient x3  With max assist to perform periara and don skirt.  Pt. Brushed teeth and combed hair with set up.  Pt. Did not want to keep left prosthesis on.  Left pt in wc with safety belt on and call bell in place.    Humberto Seals 10/07/2012, 10:07 AM

## 2012-10-07 NOTE — Progress Notes (Signed)
Elizabeth [name] is a 72 y.o. female 11-22-1940 [id number]  Subjective: No new complaints. Occasional low sugar episodes, now CBGs are >300. Slept well. C/o peeing a lot per RN  Objective: Vital signs in last 24 hours: Temp:  [97.9 F (36.6 C)-98 F (36.7 C)] 97.9 F (36.6 C) (02/16 0519) Pulse Rate:  [61-66] 61 (02/16 0519) Resp:  [16-18] 18 (02/16 0519) BP: (129-150)/(45-76) 129/45 mmHg (02/16 0519) SpO2:  [97 %-100 %] 100 % (02/16 0519) Weight change:  Last BM Date: 10/06/12  Intake/Output from previous day: 02/15 0701 - 02/16 0700 In: 410 [P.O.:410] Out: 207 [Urine:205; Stool:2] Last cbgs: CBG (last 3)   Recent Labs  10/06/12 1640 10/06/12 2152 10/07/12 0726  GLUCAP 380* 380* 326*     Physical Exam General: No apparent distress   Appears chronically ill. R face/neck ecchymoses - better  HEENT: moist mucosa Lungs: Normal effort. Lungs clear to auscultation, no crackles or wheezes. Cardiovascular: Regular rate and rhythm, no edema Musculoskeletal:  No change from before Neurological: No new neurological deficits Wounds: N/A    Skin: clear Alert, tired, cooperative   Lab Results: BMET    Component Value Date/Time   NA 129* 10/05/2012 2302   K 4.4 10/05/2012 2302   CL 94* 10/05/2012 2302   CO2 27 10/05/2012 2302   GLUCOSE 437* 10/05/2012 2302   BUN 16 10/05/2012 2302   CREATININE 0.89 10/05/2012 2302   CALCIUM 8.9 10/05/2012 2302   GFRNONAA 63* 10/05/2012 2302   GFRAA 73* 10/05/2012 2302   CBC    Component Value Date/Time   WBC 10.5 10/04/2012 1011   RBC 3.78* 10/04/2012 1011   HGB 11.2* 10/04/2012 1011   HCT 34.3* 10/04/2012 1011   PLT 245 10/04/2012 1011   MCV 90.7 10/04/2012 1011   MCH 29.6 10/04/2012 1011   MCHC 32.7 10/04/2012 1011   RDW 14.0 10/04/2012 1011   LYMPHSABS 0.9 10/01/2012 0715   MONOABS 0.6 10/01/2012 0715   EOSABS 0.1 10/01/2012 0715   BASOSABS 0.0 10/01/2012 0715    Studies/Results: No results found.  Medications: I have reviewed the  patient's current medications.  Assessment/Plan:  1. 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 DVT Prophylaxis/Anticoagulation: Chronic Coumadin for a true fibrillation. Monitor for any bleeding episodes  3. Pain Management: Ultram as needed. Pain control fair at present  4. Mood/underlying dementia. Cognition likely a major factor in her falls and gait disorder.  -pt quite alert with me today and carried on a conversation regarding her prosthesis and then proceeded to ask me where she was and in what town.  5. Neuropsych/acute deliriumThis patient is not capable of making decisions on his/her own behalf.  -significant hypoglycemia certainly playing a roll  -need to maintain sleep schedule (did worse when trazodone was held)  -lab work and head CT unremarkable, AM cortisol pending  - Thyroid studies slightly low but don't need to be treated  -will add low dose seroquel to assist with sleep and agitation  -ucx pending  6. Multiple facial fractures. Conservative care per ENT  7. Chronic orthostatic hypotension. Continue Florinef as directed for bp  8. History of CVA. Husband notes premorbid chronic visual problems related to stroke 2012  9. Renal transplant 2001. Prograf/prednisone  10. 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  -I have spoken with [facility]. She has preferred to keep the sleeve suspension system as it has been what she is accustomed to. She has had chronic distal  redundant tissue which has been a challenge for fitting.  -she was due a new prosthesis before she had her stroke last year.  -Prosthetist will be by to eval current socket and fit. She is wearing a lot of socks currently  11. Diabetes mellitus with peripheral neuropathy. Hemoglobin A1c 6.3. Lantus insulin-held at this point due to hypoglycemia/poor po intake -  Will adjust Rx -discontinue IVF for now  -continue checking blood sugars a.c. and at  bedtime  12. Coronary artery disease/CABG. No chest pain or shortness of breath  13. Hypokalemia: supplemented. 14. Polyuria. D/c IVF. UA, BMET       Length of stay, days: 9  Sonda Primes , MD 10/07/2012, 8:59 AM

## 2012-10-08 ENCOUNTER — Inpatient Hospital Stay (HOSPITAL_COMMUNITY): Payer: Medicare Other | Admitting: Speech Pathology

## 2012-10-08 ENCOUNTER — Inpatient Hospital Stay (HOSPITAL_COMMUNITY): Payer: Medicare Other | Admitting: Occupational Therapy

## 2012-10-08 ENCOUNTER — Encounter (HOSPITAL_COMMUNITY): Payer: Medicare Other | Admitting: Occupational Therapy

## 2012-10-08 ENCOUNTER — Inpatient Hospital Stay (HOSPITAL_COMMUNITY): Payer: Medicare Other | Admitting: Physical Therapy

## 2012-10-08 LAB — BASIC METABOLIC PANEL
CO2: 29 mEq/L (ref 19–32)
Calcium: 9.2 mg/dL (ref 8.4–10.5)
Chloride: 99 mEq/L (ref 96–112)
Sodium: 137 mEq/L (ref 135–145)

## 2012-10-08 LAB — CBC
MCV: 88.9 fL (ref 78.0–100.0)
Platelets: 204 10*3/uL (ref 150–400)
RBC: 3.42 MIL/uL — ABNORMAL LOW (ref 3.87–5.11)
WBC: 10.9 10*3/uL — ABNORMAL HIGH (ref 4.0–10.5)

## 2012-10-08 LAB — GLUCOSE, CAPILLARY
Glucose-Capillary: 244 mg/dL — ABNORMAL HIGH (ref 70–99)
Glucose-Capillary: 199 mg/dL — ABNORMAL HIGH (ref 70–99)

## 2012-10-08 LAB — PROTIME-INR: INR: 3.8 — ABNORMAL HIGH (ref 0.00–1.49)

## 2012-10-08 LAB — CLOSTRIDIUM DIFFICILE BY PCR: Toxigenic C. Difficile by PCR: NEGATIVE

## 2012-10-08 MED ORDER — INSULIN GLARGINE 100 UNIT/ML ~~LOC~~ SOLN: 30.0000 [IU] | Freq: Every day | SUBCUTANEOUS | Status: DC

## 2012-10-08 MED ORDER — INSULIN GLARGINE 100 UNIT/ML ~~LOC~~ SOLN
20.0000 [IU] | Freq: Every day | SUBCUTANEOUS | Status: DC
  Administered 2012-10-08 – 2012-10-10 (×3): 20 [IU] via SUBCUTANEOUS

## 2012-10-08 MED ORDER — INSULIN ASPART 100 UNIT/ML ~~LOC~~ SOLN
0.0000 [IU] | Freq: Every day | SUBCUTANEOUS | Status: DC
  Administered 2012-10-09: 2 [IU] via SUBCUTANEOUS
  Administered 2012-10-10 – 2012-10-11 (×2): 3 [IU] via SUBCUTANEOUS

## 2012-10-08 MED ORDER — INSULIN ASPART 100 UNIT/ML ~~LOC~~ SOLN
0.0000 [IU] | Freq: Three times a day (TID) | SUBCUTANEOUS | Status: DC
  Administered 2012-10-08: 15 [IU] via SUBCUTANEOUS
  Administered 2012-10-08 – 2012-10-09 (×3): 5 [IU] via SUBCUTANEOUS
  Administered 2012-10-09 (×2): 3 [IU] via SUBCUTANEOUS
  Administered 2012-10-10: 11 [IU] via SUBCUTANEOUS
  Administered 2012-10-10 – 2012-10-11 (×2): 3 [IU] via SUBCUTANEOUS
  Administered 2012-10-11: 2 [IU] via SUBCUTANEOUS
  Administered 2012-10-11: 8 [IU] via SUBCUTANEOUS
  Administered 2012-10-12: 11 [IU] via SUBCUTANEOUS

## 2012-10-08 MED ORDER — CIPROFLOXACIN HCL 250 MG PO TABS
250.0000 mg | ORAL_TABLET | Freq: Two times a day (BID) | ORAL | Status: DC
  Administered 2012-10-08 (×2): 250 mg via ORAL
  Filled 2012-10-08 (×5): qty 1

## 2012-10-08 NOTE — Progress Notes (Signed)
Speech Language Pathology Daily Session Note  Patient Details  Name: Elizabeth [name] MRN: [id number] Date of Birth: 03-10-1941  Today's Date: 10/08/2012 Time: 1478-2956 Time Calculation (min): 15 min  Pt missed 45 minutes of skilled SLP intervention due to fatigue and inability to participate due to decreased arousal.   Short Term Goals: Week 1: SLP Short Term Goal 1 (Week 1): Patient will demonstrate orientation with min assist question cues to utilize external aids. SLP Short Term Goal 2 (Week 1): Patient will demonstrate sustained attention to basic tasks for 2 minutes with min assist verbal cues. SLP Short Term Goal 3 (Week 1): Patient will demonstrate basic problem solving with familiar tasks with mod assist verbal, visual and tactile cues. SLP Short Term Goal 4 (Week 1): Patient will recall daily events/tasks with mod assist verbal and visual cues to utilize external aids.  Skilled Therapeutic Interventions: Co-Treatment with OT with focus on arousal and participation with functional tasks. Pt independently requested to utilize the bathroom but required total A for functional problem solving and safety awareness with task. Pt demonstrated language of confusion and required Max A to keep eyes open throughout session. Pt consumed a snack of yogurt and required total hand over hand assist for self-feeding due to decreased attention to task and perceptual deficits.     FIM:  Comprehension Comprehension: 2-Understands basic 25 - 49% of the time/requires cueing 51 - 75% of the time Expression Expression: 1-Expresses basis less than 25% of the time/requires cueing greater than 75% of the time. Social Interaction Social Interaction: 1-Interacts appropriately less than 25% of the time. May be withdrawn or combative. Problem Solving Problem Solving: 1-Solves basic less than 25% of the time - needs direction nearly all the time or does not effectively solve problems and may need a restraint  for safety Memory Memory: 1-Recognizes or recalls less than 25% of the time/requires cueing greater than 75% of the time  Pain No/Denies Pain  Therapy/Group: Individual Therapy  Keyana Guevara 10/08/2012, 4:29 PM

## 2012-10-08 NOTE — Plan of Care (Signed)
Problem: RH BOWEL ELIMINATION Goal: RH STG MANAGE BOWEL WITH ASSISTANCE STG Manage Bowel with mod Assistance.  Outcome: Not Progressing Patient incontinent of bowel and requires max assist  Goal: RH STG MANAGE BOWEL W/EQUIPMENT W/ASSISTANCE STG Manage Bowel With Equipment With Mod Assistance  Outcome: Not Progressing Max assist required   Problem: RH BLADDER ELIMINATION Goal: RH STG MANAGE BLADDER WITH ASSISTANCE STG Manage Bladder With min Assistance  Outcome: Not Progressing Max assist patient incontinent   Problem: RH SAFETY Goal: RH STG DECREASED RISK OF FALL WITH ASSISTANCE STG Decreased Risk of Fall With mod Assistance.  Outcome: Not Progressing Max assist required bed alarm and quick release belt used  Problem: RH COGNITION-NURSING Goal: RH STG ANTICIPATES NEEDS/CALLS FOR ASSIST W/ASSIST/CUES STG Anticipates Needs/Calls for Assist With Mod Assistance/Cues.  Outcome: Not Progressing Requires max cues to call for assist

## 2012-10-08 NOTE — Progress Notes (Signed)
Occupational Therapy Session Note  Patient Details  Name: Elizabeth [name] MRN: [id number] Date of Birth: Jan 15, 1941  Today's Date: 10/08/2012 Time: 1030-1100 Time Calculation (min): 30 min  Short Term Goals: Week 1:  OT Short Term Goal 1 (Week 1): Pt with complete UB bathing with min assist with min cues for initiation OT Short Term Goal 2 (Week 1): Pt will complete LB bathing with mod assist with min cues for initiation OT Short Term Goal 3 (Week 1): Pt will complete toilet transfer with mod assist with necessary AE. OT Short Term Goal 4 (Week 1): Pt will demonstrate recall of prior sessions with mod questioning cues.  Skilled Therapeutic Interventions/Progress Updates:    1:1 Pt in bed and had difficulty to arouse. Total A brought pt to EOB to alert her and draw her attention to looking at [place] cards from her grandchildren. Pt would not open her eyes to task even with max cuing. Pt then requested to use the bathroom, max A transfer to [facility] with total A for clothing management and hygiene. Applied KFO and prosthesis -pt unable to assist. Pt still with eyes closed. Pt able to come into standing with mod A and transfer into bed. Pt required total A to don a shirt. Pt demonstrating language of confusion and confusion about her general state. RN and PA aware. Missed 30 min due to pt with decreased participation.   Therapy Documentation Precautions:  Precautions Precautions: Fall Precaution Comments: Lt BKA Required Braces or Orthoses: Other Brace/Splint Other Brace/Splint: R KAFO, Left prosthesis for BKA Restrictions Weight Bearing Restrictions: No General: General Amount of Missed OT Time (min): 30 Minutes Missed Time Reason: Patient fatigue Pain: Pain Assessment Pain Assessment: 0-10 Pain Score:   6 Pain Type: Acute pain;Chronic pain Pain Location: Abdomen Pain Onset: With Activity Pain Intervention(s): RN made aware  See FIM for current functional  status  Therapy/Group: Individual Therapy  Roney Mans [facility] 10/08/2012, 3:45 PM

## 2012-10-08 NOTE — Progress Notes (Addendum)
10/08/2012  0100  Patient vomited moderate amount of red-colored undigested food.   Day staff reported that she had eaten more than usual today.  VSS.   Of note, patient had blood sugar of 483 confirmed with stat lab of 523.   Orders were received and administered and will continue to monitor closely..  This was at 2130,  Insulin was not given till 11pm after stat labs obtained.   [place], RN  Patient with some coughing tonight after vomiting episode, also with low-grade temp this morning 99.6  Elizabeth Foutz,RN

## 2012-10-08 NOTE — Progress Notes (Signed)
ANTICOAGULATION CONSULT NOTE - Follow Up Consult  Pharmacy Consult for Warfarin Indication: Hx Afib/CVA/DVT  Allergies  Allergen Reactions  . Percocet (Oxycodone-Acetaminophen) Nausea And Vomiting   Patient Measurements: Height: 5' 1.5" (156.2 cm) Weight: 134 lb 4.2 oz (60.9 kg) IBW/kg (Calculated) : 48.95 Vital Signs: Temp: 99.6 F (37.6 C) (02/17 0604) Temp src: Oral (02/17 0604) BP: 141/64 mmHg (02/17 0604) Pulse Rate: 74 (02/17 0604) Labs:  Recent Labs  10/05/12 2302 10/08/12 0723  HGB  --  10.0*  HCT  --  30.4*  PLT  --  204  LABPROT  --  35.2*  INR  --  3.80*  CREATININE 0.89 0.97   Estimated Creatinine Clearance: 43.9 ml/min (by C-G formula based on Cr of 0.97).  Medications:  Scheduled:  . ciprofloxacin  250 mg Oral BID  . feeding supplement  237 mL Oral TID BM  . fludrocortisone  0.1 mg Oral QODAY  . insulin aspart  0-15 Units Subcutaneous TID WC  . insulin aspart  0-5 Units Subcutaneous QHS  . [COMPLETED] insulin aspart  10 Units Subcutaneous Once  . insulin glargine  20 Units Subcutaneous Daily  . levothyroxine  150 mcg Oral Custom  . levothyroxine  175 mcg Oral Custom  . multivitamin with minerals  1 tablet Oral Daily  . mycophenolate  360 mg Oral BID  . pantoprazole  40 mg Oral Daily  . predniSONE  5 mg Oral Daily  . QUEtiapine  50 mg Oral QHS  . tacrolimus  1 mg Oral QHS  . tacrolimus  2 mg Oral Daily  . warfarin  5 mg Oral q1800  . Warfarin - Pharmacist Dosing Inpatient   Does not apply q1800  . [DISCONTINUED] cefUROXime  500 mg Oral BID WC  . [DISCONTINUED] insulin aspart  1-5 Units Subcutaneous TID WC & HS  . [DISCONTINUED] insulin glargine  10 Units Subcutaneous Daily  . [DISCONTINUED] insulin glargine  30 Units Subcutaneous Daily    Assessment: 72 y.o. F who continues on warfarin for hx Afib/CVA/DVT with supra-therapeutic INR this morning (INR 3.80, goal of 2-3). Hgb/Hct decreased, plts ok-- no s/sx of bleeding noted.   Patient had  been therapeutic on warfarin on 5mg  daily, however INR has increased today and will hold warfarin. Patient also switched this am to Cipro but culture resistant. Contacting medical team to resolve.   Goal of Therapy:  INR 2-3  Plan:  1. Hold warfarin today 2. Will adjust PT/INR checks back to daily. 3. Will continue to monitor for any signs/symptoms of bleeding. 4. Will discuss changing Cipro to Amoxicillin for enterococcus in urine with primary team.   Link Snuffer, PharmD, BCPS Clinical Pharmacist [phone] 10/08/2012 10:08 AM

## 2012-10-08 NOTE — Progress Notes (Signed)
Physical Therapy Session Note  Patient Details  Name: Elizabeth [name] MRN: [id number] Date of Birth: 23-Oct-1940  Today's Date: 10/08/2012 Time: 0830-0920 Time Calculation (min): 50 min  Short Term Goals: Week 2:  PT Short Term Goal 1 (Week 2): Bed mobility supervision,  PT Short Term Goal 2 (Week 2): Transfers bed <-> WC min assist.  PT Short Term Goal 3 (Week 2): Gait with RW 25' min assist.   PT Short Term Goal 4 (Week 2): WC mobility 90' with bil upper extremities supervision.    Skilled Therapeutic Interventions/Progress Updates:    Session overall limited by lethargy and decreased ability to participate. Donned brace and prosthesis, both very loose fitting and suction of prosthetic liner not able to stabilize limb effectively during transfer (? pt weight loss). Performed bed mobility and stand pivot transfer to bed side commode with +2 total assist pt = 40%. Pt unable to fully reach standing, unable to effectively pivot feet despite verbal, tactile cues and manual facilitation. Worked on lateral leans and weight shifting in seated position for better positioning on commode and bed, mod to max assist. Pt required +3 total assist pt = 30% for standing while cleaning and for stand pivot transfer back to bed. Pt with minimal ability to clear buttocks to scoot. RN made aware, to continue to monitor.   Therapy Documentation Precautions:  Precautions Precautions: Fall Precaution Comments: Lt BKA Required Braces or Orthoses: Other Brace/Splint Other Brace/Splint: R KAFO, Left prosthesis for BKA Restrictions Weight Bearing Restrictions: No General: Amount of Missed PT Time (min): 10 Minutes Missed Time Reason: Patient fatigue Vital Signs: Therapy Vitals Temp: 99.6 F (37.6 C) Temp src: Oral Pulse Rate: 73 Resp: 18 BP: 140/66 mmHg Patient Position, if appropriate: Lying Oxygen Therapy SpO2: 95 % Pain: Pain Assessment Pain Assessment:  (did not rate) Pain Score:   5 Pain  Type: Chronic pain Pain Location:  ("ankle" and "back") Pain Intervention(s): RN made aware  See FIM for current functional status  Therapy/Group: Individual Therapy  Wilhemina Bonito 10/08/2012, 12:17 PM

## 2012-10-08 NOTE — Progress Notes (Signed)
Physical Therapy Session Note  Patient Details  Name: Elizabeth [name] MRN: [id number] Date of Birth: December 04, 1940  Today's Date: 10/08/2012 Time: 1300-1355 Time Calculation (min): 55 min  Short Term Goals: Week 2:  PT Short Term Goal 1 (Week 2): Bed mobility supervision,  PT Short Term Goal 2 (Week 2): Transfers bed <-> WC min assist.  PT Short Term Goal 3 (Week 2): Gait with RW 25' min assist.   PT Short Term Goal 4 (Week 2): WC mobility 90' with bil upper extremities supervision.    Skilled Therapeutic Interventions/Progress Updates:    Pt with improved interaction and participation this session. Bed mobility with min assist for lower extremities, cues for sequencing. Supine > sit mod assist with increased time and sequential instruction. Practiced weight shifting and lateral leans for pre-stand positioning. Sit <> stands from bed and recliner performed with heavy mod assist. Ambulation x 6', 10' with RW with up to moderate assist, limited by lightheadedness which resolved with seated rest, BP 132/72. Partial sit-ups from reclined chair mod cues for attention to task. Pt reported "babies crying" repeatedly, verbalized visual hallucinations, and was inconsistently oriented to place.   Pt required repeated cues throughout session to open her eyes to assist with problem solving. No dizziness complaints.  Therapy Documentation Precautions:  Precautions Precautions: Fall Precaution Comments: Lt BKA Required Braces or Orthoses: Other Brace/Splint Other Brace/Splint: R KAFO, Left prosthesis for BKA, Abdominal binder Restrictions Weight Bearing Restrictions: No Pain: Pain Assessment Pain Assessment: 0-10 Pain Score:   6 Pain Type: Acute pain;Chronic pain Pain Location: Abdomen Pain Onset: With Activity Pain Intervention(s): RN made aware   See FIM for current functional status  Therapy/Group: Individual Therapy  Wilhemina Bonito 10/08/2012, 3:38 PM

## 2012-10-08 NOTE — Progress Notes (Signed)
Occupational Therapy Session Note  Patient Details  Name: Elizabeth [name] MRN: [id number] Date of Birth: 12-03-1940  Today's Date: 10/08/2012 Time: 7829-5621 Time Calculation (min): 30 min  Short Term Goals: Week 1:  OT Short Term Goal 1 (Week 1): Pt with complete UB bathing with min assist with min cues for initiation OT Short Term Goal 2 (Week 1): Pt will complete LB bathing with mod assist with min cues for initiation OT Short Term Goal 3 (Week 1): Pt will complete toilet transfer with mod assist with necessary AE. OT Short Term Goal 4 (Week 1): Pt will demonstrate recall of prior sessions with mod questioning cues.  Skilled Therapeutic Interventions/Progress Updates:  Co-treatment session. Oriented to self. Pt. seen with the focus on attending and participating in functional task. Functionally ambulated to BR using RW with Mod A for sit to stands and min A for walking/standing balance and toilet transfer.  In dayroom pt. worked on self feeding with yogurt. Pt observed "scooping and eating air" requiring max physical assist with spoon <>yougurt cup <> mouth. Pt. yes closed for more than 50%  of the session with increase lateral drift to the right. Left at nurses desk for assistance with drink.   Therapy Documentation Precautions:  Precautions Precautions: Fall Precaution Comments: Lt BKA Required Braces or Orthoses: Other Brace/Splint Other Brace/Splint: R KAFO, Left prosthesis for BKA Restrictions Weight Bearing Restrictions: No Vital Signs: Therapy Vitals Temp: 97.1 F (36.2 C) Temp src: Oral Pulse Rate: 68 Resp: 18 BP: 148/51 mmHg Patient Position, if appropriate: Sitting Oxygen Therapy SpO2: 100 % Pain: Pain Assessment Pain Assessment: 0-10 Pain Score:   6 Pain Type: Acute pain;Chronic pain Pain Location: Abdomen Pain Onset: With Activity Pain Intervention(s): RN made aware  See FIM for current functional status  Therapy/Group: Co-Treatment  Minnetta Sandora,  Eamonn Sermeno OTA/S Occupational Therapy Assistant Student 10/08/2012, 3:09 PM

## 2012-10-08 NOTE — Progress Notes (Signed)
Patient very lethargic and required multiple tries to awaken. Patient ate breakfast and lunch only when staff fed her . Patient incontinent of very soft stool this am . c- diff sample sent to lab and results are negative. Patient has required max assist to sit up . This pm patient participating with therapy and able to move min assist with therapy . Patient remains confused but cooperative . Quick release belt on up in recliner . Continue with plan of care .     Cleotilde Neer

## 2012-10-08 NOTE — Progress Notes (Signed)
This note has been reviewed and this clinician agrees with information provided.  

## 2012-10-08 NOTE — Progress Notes (Signed)
Subjective/Complaints: cbg's up substantially over last 24 hours. Sleeping better with seroquel. Slow to awaken this am A 12 point review of systems has been performed and if not noted above is otherwise negative.   Objective:   Vital Signs: Blood pressure 141/64, pulse 74, temperature 99.6 F (37.6 C), temperature source Oral, resp. rate 18, height 5' 1.5" (1.562 m), weight 60.9 kg (134 lb 4.2 oz), SpO2 95.00%. No results found. No results found for this basename: WBC, HGB, HCT, PLT,  in the last 72 hours  Recent Labs  10/05/12 2302 10/07/12 2209  NA 129*  --   K 4.4  --   CL 94*  --   GLUCOSE 437* 524*  BUN 16  --   CREATININE 0.89  --   CALCIUM 8.9  --    CBG (last 3)   Recent Labs  10/07/12 2130 10/08/12 0649 10/08/12 0718  GLUCAP 483* 244* 234*    Wt Readings from Last 3 Encounters:  10/05/12 60.9 kg (134 lb 4.2 oz)  09/27/12 64.8 kg (142 lb 13.7 oz)  09/04/12 66.225 kg (146 lb)    Physical Exam:  .Vitals reviewed.  HENT:  Multiple bruising and bruising to facial area improving Eyes: EOM are normal.  Neck: Normal range of motion. Neck supple. No thyromegaly present.  Cardiovascular:  Cardiac rate controlled Pulmonary/Chest: Effort normal and breath sounds normal. No respiratory distress.  Abdominal: Soft. Bowel sounds are normal. She exhibits no distension.  Neurological:sleeping but awakens. Follows simple commands  Motor strength is 4/5 in bilateral biceps, triceps, wrist flexors and extension and grip as well as hip flexors knee extensors ankle dorsiflexors and plantar flexors  Sensory exam is normal in the upper extremities but appears diminished in the distal right lowe limb.  Has difficulty with left finger nose to finger  Left field cut on  Gaze conjugate. Appears to have decreased visual acuity as a whole.  Wearing AFO right lower ext with movement noted within brace.  Skin: multiple bruises and contusions noted throughout. Left BKA stump intact.  Has redundant tissue on the end.   Assessment/Plan: 1. Functional deficits secondary to TBI which require 3+ hours per day of interdisciplinary therapy in a comprehensive inpatient rehab setting. Physiatrist is providing close team supervision and 24 hour management of active medical problems listed below. Physiatrist and rehab team continue to assess barriers to discharge/monitor patient progress toward functional and medical goals. FIM: FIM - Bathing Bathing Steps Patient Completed: Chest;Right Arm;Left Arm;Abdomen;Front perineal area Bathing: 3: Mod-Patient completes 5-7 50f 10 parts or 50-74%  FIM - Upper Body Dressing/Undressing Upper body dressing/undressing steps patient completed: Thread/unthread right sleeve of pullover shirt/dresss;Thread/unthread left sleeve of pullover shirt/dress;Put head through opening of pull over shirt/dress;Pull shirt over trunk Upper body dressing/undressing: 5: Set-up assist to: Obtain clothing/put away FIM - Lower Body Dressing/Undressing Lower body dressing/undressing steps patient completed: Fasten/unfasten pants (pt dons skirt over her head and then pull down with S) Lower body dressing/undressing: 1: Total-Patient completed less than 25% of tasks  FIM - Toileting Toileting steps completed by patient: Performs perineal hygiene;Adjust clothing prior to toileting;Adjust clothing after toileting Toileting: 1: Total-Patient completed zero steps, helper did all 3  FIM - Diplomatic Services operational officer Devices: Psychiatrist Transfers: 1-Two helpers  FIM - Banker Devices: Prosthesis Bed/Chair Transfer: 1: Two helpers  FIM - Locomotion: Wheelchair Locomotion: Wheelchair: 0: Activity did not occur FIM - Locomotion: Ambulation Locomotion: Ambulation Assistive Devices: Designer, industrial/product  Ambulation/Gait Assistance: 4: Min assist Locomotion: Ambulation: 1: Travels less than 50 ft with minimal  assistance (Pt.>75%)  Comprehension Comprehension Mode: Auditory Comprehension: 1-Understands basic less than 25% of the time/requires cueing 75% of the time  Expression Expression Mode: Verbal Expression: 3-Expresses basic 50 - 74% of the time/requires cueing 25 - 50% of the time. Needs to repeat parts of sentences.  Social Interaction Social Interaction: 2-Interacts appropriately 25 - 49% of time - Needs frequent redirection.  Problem Solving Problem Solving: 2-Solves basic 25 - 49% of the time - needs direction more than half the time to initiate, plan or complete simple activities  Memory Memory: 2-Recognizes or recalls 25 - 49% of the time/requires cueing 51 - 75% of the time  Medical Problem List and Plan:  1. 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 DVT Prophylaxis/Anticoagulation: Chronic Coumadin for a true fibrillation. Monitor for any bleeding episodes  3. Pain Management: Ultram as needed. Pain control fair at present 4. Mood/underlying dementia.   Cognition likely a major factor in her falls and gait disorder PTA.  5. Neuropsych/acute deliriumThis patient is not capable of making decisions on his/her own behalf.   -significant hypoglycemia certainly playing a roll  -need to maintain sleep schedule (did worse when trazodone was held)  -lab work and head CT unremarkable, AM cortisol normal  - Thyroid studies slightly low but don't need to be treated    low dose seroquel to assist with sleep and agitation  -ucx with enterococcus (see below) 6. Multiple facial fractures. Conservative care per ENT  7. Chronic orthostatic hypotension. Continue Florinef as directed for bp  8. History of CVA. Husband notes premorbid chronic visual problems related to stroke 2012  9. Renal transplant 2001. Prograf/prednisone  10. 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   -I have spoken with [facility]. She has preferred to  keep the sleeve suspension system as it has been what she is accustomed to. She has had chronic distal redundant tissue which has been a challenge for fitting.  -she was due a new prosthesis before she had her stroke last year.  -Prosthetist will be by to eval current socket and fit. She is wearing a lot of socks currently  11. Diabetes mellitus with peripheral neuropathy. Hemoglobin A1c 6.3.  -ivf stopped  -lantus resumed and increased to 30u today  -continue checking blood sugars a.c. and at bedtime  12. Coronary artery disease/CABG. No chest pain or shortness of breath  13. Hypokalemia: supplemented 14. Enterococcus in urine: 50k -change to cipro. Give low grade temp.   LOS (Days) 10 A FACE TO FACE EVALUATION WAS PERFORMED  SWARTZ,ZACHARY T 10/08/2012 7:51 AM

## 2012-10-09 ENCOUNTER — Inpatient Hospital Stay (HOSPITAL_COMMUNITY): Payer: Medicare Other

## 2012-10-09 ENCOUNTER — Inpatient Hospital Stay (HOSPITAL_COMMUNITY): Payer: Medicare Other | Admitting: Speech Pathology

## 2012-10-09 ENCOUNTER — Inpatient Hospital Stay (HOSPITAL_COMMUNITY): Payer: Medicare Other | Admitting: *Deleted

## 2012-10-09 ENCOUNTER — Encounter (HOSPITAL_COMMUNITY): Payer: Medicare Other | Admitting: Occupational Therapy

## 2012-10-09 LAB — CBC
HCT: 29.9 % — ABNORMAL LOW (ref 36.0–46.0)
Hemoglobin: 9.9 g/dL — ABNORMAL LOW (ref 12.0–15.0)
RBC: 3.34 MIL/uL — ABNORMAL LOW (ref 3.87–5.11)
WBC: 7.9 10*3/uL (ref 4.0–10.5)

## 2012-10-09 LAB — BASIC METABOLIC PANEL
CO2: 30 mEq/L (ref 19–32)
Chloride: 99 mEq/L (ref 96–112)
GFR calc Af Amer: 73 mL/min — ABNORMAL LOW (ref >90)
Potassium: 3.9 mEq/L (ref 3.5–5.1)
Sodium: 137 mEq/L (ref 135–145)

## 2012-10-09 LAB — PROTIME-INR: INR: 2.96 — ABNORMAL HIGH (ref 0.00–1.49)

## 2012-10-09 LAB — GLUCOSE, CAPILLARY
Glucose-Capillary: 177 mg/dL — ABNORMAL HIGH (ref 70–99)
Glucose-Capillary: 163 mg/dL — ABNORMAL HIGH (ref 70–99)

## 2012-10-09 MED ORDER — QUETIAPINE FUMARATE 25 MG PO TABS
25.0000 mg | ORAL_TABLET | Freq: Every day | ORAL | Status: DC
  Administered 2012-10-09 – 2012-10-10 (×2): 25 mg via ORAL
  Filled 2012-10-09 (×3): qty 1

## 2012-10-09 MED ORDER — WARFARIN SODIUM 2.5 MG PO TABS
2.5000 mg | ORAL_TABLET | Freq: Once | ORAL | Status: AC
  Administered 2012-10-09: 2.5 mg via ORAL
  Filled 2012-10-09: qty 1

## 2012-10-09 MED ORDER — AMOXICILLIN 250 MG PO CAPS
250.0000 mg | ORAL_CAPSULE | Freq: Three times a day (TID) | ORAL | Status: DC
  Administered 2012-10-09 – 2012-10-12 (×10): 250 mg via ORAL
  Filled 2012-10-09 (×15): qty 1

## 2012-10-09 NOTE — Progress Notes (Signed)
Subjective/Complaints: Rested well.   Awake this am. I'm at the hospital. Felt nauseas had diarrhea yesterday A 12 point review of systems has been performed and if not noted above is otherwise negative.   Objective:   Vital Signs: Blood pressure 100/66, pulse 69, temperature 98 F (36.7 C), temperature source Oral, resp. rate 19, height 5' 1.5" (1.562 m), weight 60.9 kg (134 lb 4.2 oz), SpO2 94.00%. No results found.  Recent Labs  10/08/12 0723 10/09/12 0615  WBC 10.9* 7.9  HGB 10.0* 9.9*  HCT 30.4* 29.9*  PLT 204 183    Recent Labs  10/07/12 2209 10/08/12 0723  NA  --  137  K  --  4.3  CL  --  99  GLUCOSE 524* 266*  BUN  --  22  CREATININE  --  0.97  CALCIUM  --  9.2   CBG (last 3)   Recent Labs  10/08/12 1606 10/08/12 2059 10/09/12 0714  GLUCAP 203* 199* 177*    Wt Readings from Last 3 Encounters:  10/05/12 60.9 kg (134 lb 4.2 oz)  09/27/12 64.8 kg (142 lb 13.7 oz)  09/04/12 66.225 kg (146 lb)    Physical Exam:  .Vitals reviewed.  HENT:  Multiple bruising and bruising to facial area improving Eyes: EOM are normal.  Neck: Normal range of motion. Neck supple. No thyromegaly present.  Cardiovascular:  Cardiac rate controlled Pulmonary/Chest: Effort normal and breath sounds normal. No respiratory distress.  Abdominal: Soft. Bowel sounds are normal. She exhibits no distension. No tenderness Neurological:sleeping but awakens. Follows simple commands  Motor strength is 4/5 in bilateral biceps, triceps, wrist flexors and extension and grip as well as hip flexors knee extensors ankle dorsiflexors and plantar flexors  Sensory exam is normal in the upper extremities but appears diminished in the distal right lowe limb.  Has difficulty with left finger nose to finger  Left field cut on  Gaze conjugate. Appears to have decreased visual acuity as a whole.  Wearing AFO right lower ext with movement noted within brace.  Skin: multiple bruises and contusions noted  throughout. Left BKA stump intact. Has redundant tissue on the end.   Assessment/Plan: 1. Functional deficits secondary to TBI which require 3+ hours per day of interdisciplinary therapy in a comprehensive inpatient rehab setting. Physiatrist is providing close team supervision and 24 hour management of active medical problems listed below. Physiatrist and rehab team continue to assess barriers to discharge/monitor patient progress toward functional and medical goals. FIM: FIM - Bathing Bathing Steps Patient Completed: Chest;Right Arm;Left Arm;Abdomen;Front perineal area Bathing: 3: Mod-Patient completes 5-7 60f 10 parts or 50-74%  FIM - Upper Body Dressing/Undressing Upper body dressing/undressing steps patient completed: Thread/unthread right sleeve of pullover shirt/dresss;Thread/unthread left sleeve of pullover shirt/dress;Put head through opening of pull over shirt/dress;Pull shirt over trunk Upper body dressing/undressing: 5: Set-up assist to: Obtain clothing/put away FIM - Lower Body Dressing/Undressing Lower body dressing/undressing steps patient completed: Fasten/unfasten pants (pt dons skirt over her head and then pull down with S) Lower body dressing/undressing: 1: Total-Patient completed less than 25% of tasks  FIM - Toileting Toileting steps completed by patient: Performs perineal hygiene;Adjust clothing prior to toileting;Adjust clothing after toileting Toileting: 1: Total-Patient completed zero steps, helper did all 3  FIM - Diplomatic Services operational officer Devices: Art gallery manager Transfers: 3-To toilet/BSC: Mod A (lift or lower assist);3-From toilet/BSC: Mod A (lift or lower assist)  FIM - Banker Devices: Manufacturing systems engineer Transfer: 3: Supine >  Sit: Mod A (lifting assist/Pt. 50-74%/lift 2 legs;2: Bed > Chair or W/C: Max A (lift and lower assist)  FIM - Locomotion: Wheelchair Locomotion: Wheelchair: 0: Activity did not  occur FIM - Locomotion: Ambulation Locomotion: Ambulation Assistive Devices: Designer, industrial/product Ambulation/Gait Assistance: 3: Mod assist Locomotion: Ambulation: 1: Travels less than 50 ft with minimal assistance (Pt.>75%)  Comprehension Comprehension Mode: Auditory;Visual Comprehension: 2-Understands basic 25 - 49% of the time/requires cueing 51 - 75% of the time  Expression Expression Mode: Verbal Expression: 1-Expresses basis less than 25% of the time/requires cueing greater than 75% of the time.  Social Interaction Social Interaction: 1-Interacts appropriately less than 25% of the time. May be withdrawn or combative.  Problem Solving Problem Solving: 1-Solves basic less than 25% of the time - needs direction nearly all the time or does not effectively solve problems and may need a restraint for safety  Memory Memory: 1-Recognizes or recalls less than 25% of the time/requires cueing greater than 75% of the time  Medical Problem List and Plan:  1. 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 DVT Prophylaxis/Anticoagulation: Chronic Coumadin for a true fibrillation. Monitor for any bleeding episodes  3. Pain Management: Ultram as needed. Pain control fair at present 4. Mood/underlying dementia.   Cognition likely a major factor in her falls and gait disorder PTA.  5. Neuropsych/acute deliriumThis patient is not capable of making decisions on his/her own behalf.   -significant hypoglycemia certainly playing a roll  -need to maintain sleep schedule (did worse when trazodone was held)  -lab work and head CT unremarkable, AM cortisol normal  - Thyroid studies slightly low but don't need to be treated    low dose seroquel to assist with sleep and agitation  -ucx with enterococcus (see below) 6. Multiple facial fractures. Conservative care per ENT  7. Chronic orthostatic hypotension. Continue Florinef as directed for bp  8. History of CVA. Husband notes  premorbid chronic visual problems related to stroke 2012  9. Renal transplant 2001. Prograf/prednisone  10. 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   -I have spoken with [facility]. She has preferred to keep the sleeve suspension system as it has been what she is accustomed to. She has had chronic distal redundant tissue which has been a challenge for fitting.  -she was due a new prosthesis before she had her stroke last year.  -Prosthetist will be by to eval current socket and fit. She is wearing a lot of socks currently  11. Diabetes mellitus with peripheral neuropathy. Hemoglobin A1c 6.3.  -ivf stopped  -lantus resumed and increased to 20u yesterday. Will titrate further as indicated  -continue checking blood sugars a.c. and at bedtime  12. Coronary artery disease/CABG. No chest pain or shortness of breath  13. Hypokalemia: supplemented 14. Enterococcus in urine: 50k -on cipro currently--still has had low grade temp.  -will change the cipro to amoxil given nausea/GI sx   LOS (Days) 11 A FACE TO FACE EVALUATION WAS PERFORMED  SWARTZ,ZACHARY T 10/09/2012 7:39 AM

## 2012-10-09 NOTE — Progress Notes (Signed)
Occupational Therapy Weekly Progress Note  Patient Details  Name: Elizabeth [name] MRN: [id number] Date of Birth: 1941-07-14  Today's Date: 10/09/2012 Time: 0830-0900 Time Calculation (min): 30 min  1:1 Pt more alert and appropriate this morning than I have seen her since last week. When I walked into the room she asked appropriately " Can you put on my legs, so I can get up?"  Pt sitting in bed feeding herself breakfast, with improved hand to mouth coordination and accuracy compared to yesterday. Pt asked to go to the bathroom. Pt came to EOB with max A and KFO and prosthesis was donned.Sit to stand with min A with extra time and demonstrate functional ambulation with min A and Assistance to steer RW towards toilet in BR. Performed toileting with steady A with extra time. Pt donned a clean nightgown with min A for clothing orientation. Pt able to ambulate back out to recliner. REported if she ate her fruit she "may need my insulin." demonstrating increased appropriate behaviors.   Patient has met 2 of 4 short term goals.  Pt had been progressing well until Oct 05, 2011 with pt demonstrating decreased alertness, attention, inability to keep her eyes open and inability to participate in therapy. Pt with blood sugars low and too high with a fever. Pt's current progress has been inconsistent. Pt requires anywhere from supervision to total A for UB bathing and dressing and mod A to total A for LB bathing and dressing (except for KAFO and prosthesis) and toileting. Pt continues to have incontinent episodes of urine and BM. Pt's therapy has been q.d due to fluctuating progress.  Patient continues to demonstrate the following deficits: muscle weakness, decreased cardiorespiratoy endurance, impaired timing and sequencing and decreased coordination, decreased visual perceptual skills,  decreased initiation, decreased attention, decreased awareness, decreased problem solving, decreased safety awareness,  decreased memory and delayed processing and decreased sitting balance, decreased standing balance, decreased postural control and decreased balance strategies and therefore will continue to benefit from skilled OT intervention to enhance overall performance with BADL and Reduce care partner burden.  Patient progressing toward long term goals..  Continue plan of care.  OT Short Term Goals Week 1:  OT Short Term Goal 1 (Week 1): Pt with complete UB bathing with min assist with min cues for initiation OT Short Term Goal 1 - Progress (Week 1): Met OT Short Term Goal 2 (Week 1): Pt will complete LB bathing with mod assist with min cues for initiation OT Short Term Goal 2 - Progress (Week 1): Not met OT Short Term Goal 3 (Week 1): Pt will complete toilet transfer with mod assist with necessary AE. OT Short Term Goal 3 - Progress (Week 1): Met OT Short Term Goal 4 (Week 1): Pt will demonstrate recall of prior sessions with mod questioning cues. OT Short Term Goal 4 - Progress (Week 1): Not met Week 2:  OT Short Term Goal 1 (Week 2): STG=LTG  Skilled Therapeutic Interventions/Progress Updates:      Therapy Documentation Precautions:  Precautions Precautions: Fall Precaution Comments: Lt BKA Required Braces or Orthoses: Other Brace/Splint Other Brace/Splint: R KAFO, Left prosthesis for BKA Restrictions Weight Bearing Restrictions: No Pain: Pain Assessment Pain Assessment: 0-10 Pain Score:   6 Pain Type: Acute pain Pain Location: Head Pain Descriptors: Aching Pain Frequency: Occasional Pain Onset: Gradual Patients Stated Pain Goal: 2 Pain Intervention(s): Medication (See eMAR) Multiple Pain Sites: No  See FIM for current functional status  Therapy/Group: Individual Therapy  Katrinka Blazing,  Vilinda Flake 10/09/2012, 11:19 AM

## 2012-10-09 NOTE — Progress Notes (Signed)
Physical Therapy Note  Patient Details  Name: Elizabeth [name] MRN: [id number] Date of Birth: [birth year] Today's Date: 10/09/2012  2:00 - 2:40 40 minutes Individual session Patient reports back pain at 9 following session. Nursing notified and provided pain medication.  Patient sitting in recliner upon entering room. Patient says she does not remember eating anything today and is very hungry - requesting Cheerios. Patient ate cheerios with occasional cueing to attend to task. Patient sit to stand and ambulated 12 feet x 2 with rolling walker and min assist. Patient to toilet requiring mod assist on and off. Patient with continent BM episode and requiring assist with getting brief up and down. Patient returned to recliner with quick release belt in place, phone and call bell in reach.     Arelia Longest M 10/09/2012, 3:05 PM

## 2012-10-09 NOTE — Progress Notes (Signed)
Recreational Therapy Discharge Summary Patient Details  Name: Elizabeth [name] MRN: [id number] Date of Birth: June 30, 1941 Today's Date: 10/09/2012  Long term goals set: 1  Long term goals met: 0  Comments on progress toward goals: Pt being discharged from TR services at this time due to lethargy and limited engagement in attempted TR tasks.  Will continue to monitor through team for future participation.  Reasons for discharge: see above  Patient/family agrees with progress made and goals achieved: Yes  [name] 10/09/2012, 4:33 PM

## 2012-10-09 NOTE — Progress Notes (Signed)
Speech Language Pathology Session & Weekly Progress Notes  Patient Details  Name: Elizabeth [name] MRN: [id number] Date of Birth: [birth year]  Today's Date: 10/09/2012 Time: 4098-1191 Time Calculation (min): 45 min  Short Term Goals: Week 1: SLP Short Term Goal 1 (Week 1): Patient will demonstrate orientation with min assist question cues to utilize external aids. SLP Short Term Goal 1 - Progress (Week 1): Met SLP Short Term Goal 2 (Week 1): Patient will demonstrate sustained attention to basic tasks for 2 minutes with min assist verbal cues. SLP Short Term Goal 2 - Progress (Week 1): Not met SLP Short Term Goal 3 (Week 1): Patient will demonstrate basic problem solving with familiar tasks with mod assist verbal, visual and tactile cues. SLP Short Term Goal 3 - Progress (Week 1): Not met SLP Short Term Goal 4 (Week 1): Patient will recall daily events/tasks with mod assist verbal and visual cues to utilize external aids. SLP Short Term Goal 4 - Progress (Week 1): Not met  New Short Term Goals: Week 2: SLP Short Term Goal 1 (Week 2): Pt will demonstrate sustained attention to a functional task for 5 minutes with Max A verbal cues for redirection SLP Short Term Goal 2 (Week 2): Pt will demonstrate functional problem solving for basic and familiar tasks with Mod A verbal and question cues.  SLP Short Term Goal 3 (Week 2): Pt will utilize external memory aids to recall new, daily events with Max A mutlimodal cueing SLP Short Term Goal 4 (Week 2): Pt will utilize call bell to express wants/needs with Mod A question cues.   Weekly Progress Updates: Pt has met 1 of 4 STG's this reporting period. Pt was making functional cognitive gains until 10/04/12 when pt demonstrated increased fatigue and lethargy with decreased cognitive function. Currently, pt requires Max-Total A multimodal cueing for sustained attention, functional problem solving, and recall of new information with all functional tasks due  to lethargy and keeping her eyes closed throughout treatment sessions. Pt is consistently oriented to place and situation but requires cueing to utilize calendar to orient to date. Pt would benefit from continued skilled SLP intervention to maximize cognitive function and overall functional independence.      SLP Intensity: Minumum of 1-2 x/day, 30 to 90 minutes SLP Frequency: 5 out of 7 days SLP Duration/Estimated Length of Stay: 2 weeks SLP Treatment/Interventions: Cognitive remediation/compensation;Cueing hierarchy;Functional tasks;Environmental controls;Internal/external aids;Therapeutic Activities;Patient/family education  Daily Session Skilled Therapeutic Intervention: Treatment focus on cognitive goals. Pt was asleep upon entering room and required verbal and tactile stimulation for arousal. Pt independently reported she had a headache and required Max A question and visual cues for functional problem solving for requesting nurse. Pt required Max A multimodal cueing for sustained attention for ~1 minute throughout the session due to fatigue and decreased arousal. Pt also required Max A question cues for divergent picture task.  FIM:  Comprehension Comprehension Mode: Auditory;Visual Comprehension: 3-Understands basic 50 - 74% of the time/requires cueing 25 - 50%  of the time Expression Expression Mode: Verbal Expression: 2-Expresses basic 25 - 49% of the time/requires cueing 50 - 75% of the time. Uses single words/gestures. Social Interaction Social Interaction: 2-Interacts appropriately 25 - 49% of time - Needs frequent redirection. Problem Solving Problem Solving: 2-Solves basic 25 - 49% of the time - needs direction more than half the time to initiate, plan or complete simple activities Memory Memory: 2-Recognizes or recalls 25 - 49% of the time/requires cueing 51 - 75% of the  time FIM - Eating Eating Activity: 5: Set-up assist for open containers;5: Needs verbal  cues/supervision Pain Pain Assessment Pain Assessment: No/denies pain Pain Score: 0-No pain Pain Type: Acute pain Pain Location: Head Pain Orientation: Right Pain Descriptors: Aching Pain Frequency: Occasional Pain Onset: Gradual Patients Stated Pain Goal: 2 Pain Intervention(s): Medication (See eMAR) Multiple Pain Sites: No  Therapy/Group: Individual Therapy  Kandis Henry 10/09/2012, 1:07 PM

## 2012-10-09 NOTE — Progress Notes (Signed)
ANTICOAGULATION CONSULT NOTE - Follow Up Consult  Pharmacy Consult for Warfarin Indication: Hx Afib/CVA/DVT  Allergies  Allergen Reactions  . Percocet (Oxycodone-Acetaminophen) Nausea And Vomiting   Patient Measurements: Height: 5' 1.5" (156.2 cm) Weight: 134 lb 4.2 oz (60.9 kg) IBW/kg (Calculated) : 48.95 Vital Signs: Temp: 98 F (36.7 C) (02/18 0600) Temp src: Oral (02/18 0600) BP: 100/66 mmHg (02/18 0600) Pulse Rate: 69 (02/18 0600) Labs:  Recent Labs  10/08/12 0723 10/09/12 0615  HGB 10.0* 9.9*  HCT 30.4* 29.9*  PLT 204 183  LABPROT 35.2* 29.3*  INR 3.80* 2.96*  CREATININE 0.97 0.89   Estimated Creatinine Clearance: 47.8 ml/min (by C-G formula based on Cr of 0.89).  Medications:  Scheduled:  . amoxicillin  250 mg Oral Q8H  . feeding supplement  237 mL Oral TID BM  . fludrocortisone  0.1 mg Oral QODAY  . insulin aspart  0-15 Units Subcutaneous TID WC  . insulin aspart  0-5 Units Subcutaneous QHS  . insulin glargine  20 Units Subcutaneous Daily  . levothyroxine  150 mcg Oral Custom  . levothyroxine  175 mcg Oral Custom  . multivitamin with minerals  1 tablet Oral Daily  . mycophenolate  360 mg Oral BID  . pantoprazole  40 mg Oral Daily  . predniSONE  5 mg Oral Daily  . QUEtiapine  50 mg Oral QHS  . tacrolimus  1 mg Oral QHS  . tacrolimus  2 mg Oral Daily  . Warfarin - Pharmacist Dosing Inpatient   Does not apply q1800  . [DISCONTINUED] ciprofloxacin  250 mg Oral BID  . [DISCONTINUED] warfarin  5 mg Oral q1800    Assessment: 72 y.o. F who continues on warfarin for hx Afib/CVA/DVT with INR down today to 2.96 (at goal) after coumadin was held last night due to supra therapeutic INR of 3.8 yesterday.  Cipro also dced, which interacts with coumadin.  Will restart coumadin today.  Hgb/Hct decreased, plts ok-- no s/sx of bleeding noted.    Goal of Therapy:  INR 2-3  Plan:  1. Coumadin 2.5 mg po x 1 dose today.   2. Daily PT/INR.  3. Will continue to  monitor for any signs/symptoms of bleeding.  Wendie Simmer, PharmD, BCPS Clinical Pharmacist  Pager: [phone]

## 2012-10-10 ENCOUNTER — Encounter (HOSPITAL_COMMUNITY): Payer: Medicare Other | Admitting: Occupational Therapy

## 2012-10-10 ENCOUNTER — Inpatient Hospital Stay (HOSPITAL_COMMUNITY): Payer: Medicare Other | Admitting: Speech Pathology

## 2012-10-10 ENCOUNTER — Inpatient Hospital Stay (HOSPITAL_COMMUNITY): Payer: Medicare Other | Admitting: Physical Therapy

## 2012-10-10 DIAGNOSIS — S069X9A Unspecified intracranial injury with loss of consciousness of unspecified duration, initial encounter: Secondary | ICD-10-CM

## 2012-10-10 DIAGNOSIS — S88119A Complete traumatic amputation at level between knee and ankle, unspecified lower leg, initial encounter: Secondary | ICD-10-CM

## 2012-10-10 DIAGNOSIS — I951 Orthostatic hypotension: Secondary | ICD-10-CM

## 2012-10-10 DIAGNOSIS — W19XXXA Unspecified fall, initial encounter: Secondary | ICD-10-CM

## 2012-10-10 DIAGNOSIS — E1165 Type 2 diabetes mellitus with hyperglycemia: Secondary | ICD-10-CM

## 2012-10-10 LAB — GLUCOSE, CAPILLARY
Glucose-Capillary: 78 mg/dL (ref 70–99)
Glucose-Capillary: 333 mg/dL — ABNORMAL HIGH (ref 70–99)
Glucose-Capillary: 110 mg/dL — ABNORMAL HIGH (ref 70–99)
Glucose-Capillary: 281 mg/dL — ABNORMAL HIGH (ref 70–99)

## 2012-10-10 MED ORDER — WARFARIN SODIUM 5 MG PO TABS
5.0000 mg | ORAL_TABLET | Freq: Once | ORAL | Status: AC
  Administered 2012-10-10: 5 mg via ORAL
  Filled 2012-10-10: qty 1

## 2012-10-10 MED ORDER — GLUCOSE 40 % PO GEL: 1.0000 | ORAL | Status: DC | PRN

## 2012-10-10 NOTE — Progress Notes (Signed)
Social Work Patient ID: Elizabeth [name], female   DOB: 06-11-1941, 72 y.o.   MRN: [id number]  Met yesterday afternoon and today with patient and husband to review team conference and discuss d/c planning concerns.  Husband fully aware that pt's cognition and functional levels have fluctuated throughout her stay on CIR and team concerned about him trying to meet her care needs at home.  Offered option of SNF.  Husband quickly reports he fully intends to take pt home and provide her with  24/7 assistance. He notes, "she will just get worse at a nursing home... Not doing that."  Per husbands feedback then, I have discussed with MD and tx team that we need to reassess if we would recommend sticking with originally targeted d/c date of 2/21 or recommend a slight extension.  Husband plans to be here in the morning to attend therapies, therefore, we can discuss at that time.  Continue to follow.  Elizabeth Hsia, LCSW

## 2012-10-10 NOTE — Progress Notes (Signed)
NUTRITION FOLLOW UP  Intervention:   1. Continue to encourage Glucerna Shake and meals as able 2. RD to continue to follow nutrition care plan  Nutrition Dx:   Increased nutrient needs related to TBI and pressure ulcer as evidenced by estimated nutrition needs. Ongoing.  Goal:   PO intake to meet >/=90% estimated nutrition needs. Unmet.  Monitor:   PO intake, weight trends, I/O's, labs  Assessment:   Pt with hypoglycemia episodes on 2/13, 2/14, 2/15. Continues to work towards d/c on 2/21.  Continues to have periods of lethargy and poor attention. On average, consuming 50% of meals. Seems to do better when fed/encouraged by staff/family.  Height: Ht Readings from Last 1 Encounters:  09/28/12 5' 1.5" (1.562 m)    Weight Status:   Wt Readings from Last 1 Encounters:  10/05/12 134 lb 4.2 oz (60.9 kg)  Wt down 8 lb x 1 week  Re-estimated needs:  Kcal: 1600 - 1800 Protein: 98 - 110 grams Fluid: 1.6 - 1.8 liters  Skin: stage II R heel  Diet Order: Carb Control Medium; 1600 - 2000   Intake/Output Summary (Last 24 hours) at 10/10/12 1011 Last data filed at 10/09/12 1800  Gross per 24 hour  Intake    480 ml  Output      0 ml  Net    480 ml    Last BM: 2/19   Labs:   Recent Labs Lab 10/05/12 2302 10/07/12 2209 10/08/12 0723 10/09/12 0615  NA 129*  --  137 137  K 4.4  --  4.3 3.9  CL 94*  --  99 99  CO2 27  --  29 30  BUN 16  --  22 19  CREATININE 0.89  --  0.97 0.89  CALCIUM 8.9  --  9.2 9.0  GLUCOSE 437* 524* 266* 189*    CBG (last 3)   Recent Labs  10/10/12 0239 10/10/12 0304 10/10/12 0708  GLUCAP 78 97 159*    Scheduled Meds: . amoxicillin  250 mg Oral Q8H  . feeding supplement  237 mL Oral TID BM  . fludrocortisone  0.1 mg Oral QODAY  . insulin aspart  0-15 Units Subcutaneous TID WC  . insulin aspart  0-5 Units Subcutaneous QHS  . insulin glargine  20 Units Subcutaneous Daily  . levothyroxine  150 mcg Oral Custom  . levothyroxine  175  mcg Oral Custom  . multivitamin with minerals  1 tablet Oral Daily  . mycophenolate  360 mg Oral BID  . pantoprazole  40 mg Oral Daily  . predniSONE  5 mg Oral Daily  . QUEtiapine  25 mg Oral QHS  . tacrolimus  1 mg Oral QHS  . tacrolimus  2 mg Oral Daily  . warfarin  5 mg Oral ONCE-1800  . Warfarin - Pharmacist Dosing Inpatient   Does not apply q1800    Continuous Infusions:  none  Jarold Motto MS, RD, LDN Pager: [phone] After-hours pager: [phone]

## 2012-10-10 NOTE — Significant Event (Signed)
Hypoglycemic Event  CBG: 46  Treatment: 15 GM carbohydrate snack and 15 GM gel  Symptoms: lethargic  Follow-up CBG: Time: 1540 CBG Result:110  Possible Reasons for Event: Medication regimen: 11 units novolog SSI at lunch for CBG 333  Comments/MD notified:yes Dan Angiulli PA    Hedy Camara  Remember to initiate Hypoglycemia Order Set & complete

## 2012-10-10 NOTE — Progress Notes (Signed)
This note has been reviewed and this clinician agrees with information provided.  

## 2012-10-10 NOTE — Progress Notes (Signed)
Physical Therapy Note  Patient Details  Name: Elizabeth [name] MRN: [id number] Date of Birth: 01/04/1941 Today's Date: 10/10/2012  Time: 1000-1030 30 minutes  Pt c/o stomach pain, RN aware.  Pt requests to use restroom.  Sit to stand with mod A, cues for UE placement.  Gait with RW with min A, cues for RW safety and management in bathroom, cues for sequencing steps for toileting and toilet transfers, able to perform with min A.  Pt able to wash hands with min-mod cuing for sequencing. Gait training with RW controlled environment with min steadying assist.  W/c mobility with B UEs with min A, max cuing for steering and obstacle negotiation.  Individual therapy   DONAWERTH,KAREN 10/10/2012, 10:26 AM

## 2012-10-10 NOTE — Progress Notes (Signed)
Speech Language Pathology Daily Session Note  Patient Details  Name: Elizabeth [name] MRN: [id number] Date of Birth: May 25, 1941  Today's Date: 10/10/2012 Time: 0830-0910 Time Calculation (min): 40 min  Short Term Goals: Week 2: SLP Short Term Goal 1 (Week 2): Pt will demonstrate sustained attention to a functional task for 5 minutes with Max A verbal cues for redirection SLP Short Term Goal 2 (Week 2): Pt will demonstrate functional problem solving for basic and familiar tasks with Mod A verbal and question cues.  SLP Short Term Goal 3 (Week 2): Pt will utilize external memory aids to recall new, daily events with Max A mutlimodal cueing SLP Short Term Goal 4 (Week 2): Pt will utilize call bell to express wants/needs with Mod A question cues.   Skilled Therapeutic Interventions: Treatment focus on cognitive goals. Pt was asleep in a recliner upon entering room but was easily arousable. Pt required Max A verbal and visual cueing for functional problem solving with tray set-up and consumed meal with Mod A verbal cues for sustained attention to self-feeding. Pt also required Max A question cues to utilize external memory aids for orientation to time.    FIM:  Comprehension Comprehension Mode: Auditory Comprehension: 3-Understands basic 50 - 74% of the time/requires cueing 25 - 50%  of the time Expression Expression: 2-Expresses basic 25 - 49% of the time/requires cueing 50 - 75% of the time. Uses single words/gestures. Social Interaction Social Interaction: 2-Interacts appropriately 25 - 49% of time - Needs frequent redirection. Problem Solving Problem Solving: 2-Solves basic 25 - 49% of the time - needs direction more than half the time to initiate, plan or complete simple activities Memory Memory: 2-Recognizes or recalls 25 - 49% of the time/requires cueing 51 - 75% of the time  Pain Pain Assessment Pain Assessment: No/denies pain  Therapy/Group: Individual Therapy  Elizabeth [name],  Elizabeth [name] 10/10/2012, 5:02 PM

## 2012-10-10 NOTE — Progress Notes (Signed)
Subjective/Complaints: More alert this am. Knew she was on rehab. Slept fairly well. A 12 point review of systems has been performed and if not noted above is otherwise negative.   Objective:   Vital Signs: Blood pressure 170/118, pulse 61, temperature 98.1 F (36.7 C), temperature source Oral, resp. rate 17, height 5' 1.5" (1.562 m), weight 60.9 kg (134 lb 4.2 oz), SpO2 96.00%. No results found.  Recent Labs  10/08/12 0723 10/09/12 0615  WBC 10.9* 7.9  HGB 10.0* 9.9*  HCT 30.4* 29.9*  PLT 204 183    Recent Labs  10/08/12 0723 10/09/12 0615  NA 137 137  K 4.3 3.9  CL 99 99  GLUCOSE 266* 189*  BUN 22 19  CREATININE 0.97 0.89  CALCIUM 9.2 9.0   CBG (last 3)   Recent Labs  10/10/12 0239 10/10/12 0304 10/10/12 0708  GLUCAP 78 97 159*    Wt Readings from Last 3 Encounters:  10/05/12 60.9 kg (134 lb 4.2 oz)  09/27/12 64.8 kg (142 lb 13.7 oz)  09/04/12 66.225 kg (146 lb)    Physical Exam:  .Vitals reviewed.  HENT:  Multiple bruising and bruising to facial area improving Eyes: EOM are normal.  Neck: Normal range of motion. Neck supple. No thyromegaly present.  Cardiovascular:  Cardiac rate controlled. No murmur.  Pulmonary/Chest: Effort normal and breath sounds normal. No respiratory distress.  Abdominal: Soft. Bowel sounds are normal. She exhibits no distension. No tenderness Neurological:sleeping but awakens. Follows simple commands  Motor strength is 4/5 in bilateral biceps, triceps, wrist flexors and extension and grip as well as hip flexors knee extensors ankle dorsiflexors and plantar flexors  Sensory exam is normal in the upper extremities but appears diminished in the distal right lowe limb.  Has difficulty with left finger nose to finger  Left field cut on  Gaze conjugate. Appears to have decreased visual acuity as a whole.  Wearing AFO right lower ext with movement noted within brace.  Skin: multiple bruises and contusions noted throughout. Left  BKA stump intact. Has redundant tissue on the end.   Assessment/Plan: 1. Functional deficits secondary to TBI which require 3+ hours per day of interdisciplinary therapy in a comprehensive inpatient rehab setting. Physiatrist is providing close team supervision and 24 hour management of active medical problems listed below. Physiatrist and rehab team continue to assess barriers to discharge/monitor patient progress toward functional and medical goals.  Spoke with husband at length regarding her presentation, TBI, etc. He is still planning on bringing her home  FIM: FIM - Bathing Bathing Steps Patient Completed: Chest;Right Arm;Left Arm;Abdomen;Front perineal area Bathing: 3: Mod-Patient completes 5-7 50f 10 parts or 50-74%  FIM - Upper Body Dressing/Undressing Upper body dressing/undressing steps patient completed: Thread/unthread right sleeve of pullover shirt/dresss;Thread/unthread left sleeve of pullover shirt/dress;Put head through opening of pull over shirt/dress;Pull shirt over trunk Upper body dressing/undressing: 5: Set-up assist to: Obtain clothing/put away FIM - Lower Body Dressing/Undressing Lower body dressing/undressing steps patient completed: Fasten/unfasten pants (pt dons skirt over her head and then pull down with S) Lower body dressing/undressing: 1: Total-Patient completed less than 25% of tasks  FIM - Toileting Toileting steps completed by patient: Performs perineal hygiene Toileting Assistive Devices: Grab bar or rail for support Toileting: 2: Max-Patient completed 1 of 3 steps  FIM - Diplomatic Services operational officer Devices: Grab bars;Walker Toilet Transfers: 3-To toilet/BSC: Mod A (lift or lower assist);3-From toilet/BSC: Mod A (lift or lower assist)  FIM - Press photographer  Assistive Devices: Bed rails;Arm rests;HOB elevated;Prosthesis;Orthosis Bed/Chair Transfer: 2: Supine > Sit: Max A (lifting assist/Pt. 25-49%);2: Sit > Supine:  Max A (lifting assist/Pt. 25-49%);2: Bed > Chair or W/C: Max A (lift and lower assist);2: Chair or W/C > Bed: Max A (lift and lower assist)  FIM - Locomotion: Wheelchair Locomotion: Wheelchair: 0: Activity did not occur FIM - Locomotion: Ambulation Locomotion: Ambulation Assistive Devices: Designer, industrial/product Ambulation/Gait Assistance: 4: Min assist Locomotion: Ambulation: 1: Travels less than 50 ft with minimal assistance (Pt.>75%)  Comprehension Comprehension Mode: Auditory Comprehension: 4-Understands basic 75 - 89% of the time/requires cueing 10 - 24% of the time  Expression Expression Mode: Verbal Expression: 4-Expresses basic 75 - 89% of the time/requires cueing 10 - 24% of the time. Needs helper to occlude trach/needs to repeat words.  Social Interaction Social Interaction: 4-Interacts appropriately 75 - 89% of the time - Needs redirection for appropriate language or to initiate interaction.  Problem Solving Problem Solving: 3-Solves basic 50 - 74% of the time/requires cueing 25 - 49% of the time  Memory Memory: 3-Recognizes or recalls 50 - 74% of the time/requires cueing 25 - 49% of the time  Medical Problem List and Plan:  1. 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 DVT Prophylaxis/Anticoagulation: Chronic Coumadin for a true fibrillation. Monitor for any bleeding episodes  3. Pain Management: Ultram as needed. Pain control fair at present 4. Mood/underlying dementia.   Cognition likely a major factor in her falls and gait disorder PTA.  5. Neuropsych/acute deliriumThis patient is not capable of making decisions on his/her own behalf.   -significant hypoglycemia certainly playing a roll  -need to maintain sleep schedule --consider changing back to trazodone  -lab work and head CT unremarkable, AM cortisol normal  - Thyroid studies slightly low but don't need to be treated    low dose seroquel to assist with sleep and agitation--tapering off  as possible  -ucx with enterococcus (see below) 6. Multiple facial fractures. Conservative care per ENT  7. Chronic orthostatic hypotension. Continue Florinef as directed for bp  8. History of CVA. Husband notes premorbid chronic visual problems related to stroke 2012  9. Renal transplant 2001. Prograf/prednisone  10. 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   -I have spoken with [facility]. She has preferred to keep the sleeve suspension system as it has been what she is accustomed to. She has had chronic distal redundant tissue which has been a challenge for fitting.  -she was due a new prosthesis before she had her stroke last year.  -outpt socket revision 11. Diabetes mellitus with peripheral neuropathy. Hemoglobin A1c 6.3.  -ivf stopped  -lantus resumed and increased to 20u yesterday. Consider adding an AM dose  -continue checking blood sugars a.c. and at bedtime  12. Coronary artery disease/CABG. No chest pain or shortness of breath  13. Hypokalemia: supplemented 14. Enterococcus in urine: 50k -on cipro currently--still has had low grade temp.  -changed to amoxil given nausea/GI sx   LOS (Days) 12 A FACE TO FACE EVALUATION WAS PERFORMED  Domingos Riggi T 10/10/2012 8:12 AM

## 2012-10-10 NOTE — Progress Notes (Signed)
Occupational Therapy Session Note  Patient Details  Name: Elizabeth [name] MRN: [id number] Date of Birth: 1940-09-24  Today's Date: 10/10/2012 Time: 0730-0800 Time Calculation (min): 30 min  Short Term Goals: Week 2:  OT Short Term Goal 1 (Week 2): STG=LTG  Skilled Therapeutic Interventions/Progress Updates:  Pt. Participated in 1:1 ot session to work on initiating and attending self care task. Pt. In bed upon arrival. After application of KAFO and prosthesis, functional ambulated to BR  Using RW to work on sit to stands, toilet transfers, and toileting; requiring min-mod A to stand and steady assist for walking and guiding walker. Completed self care task (grooming, UB bathing, and dressing) sitting in recliner with bedside table needing mod cues for attention and carryout of task. Left in recliner with quick release and call bell in reach.   Therapy Documentation Precautions:  Precautions Precautions: Fall Precaution Comments: Lt BKA Required Braces or Orthoses: Other Brace/Splint Other Brace/Splint: R KAFO, Left prosthesis for BKA Restrictions Weight Bearing Restrictions: No Pain: Pain Assessment Pain Assessment: No/denies pain   See FIM for current functional status  Therapy/Group: Individual Therapy  Elsey Holts OTA/ Occupational Therapy Assistant Student 10/10/2012, 12:25 PM

## 2012-10-10 NOTE — Progress Notes (Signed)
ANTICOAGULATION CONSULT NOTE - Follow Up Consult  Pharmacy Consult for coumadin Indication: afib/CVA/DVT  Allergies  Allergen Reactions  . Percocet (Oxycodone-Acetaminophen) Nausea And Vomiting    Patient Measurements: Height: 5' 1.5" (156.2 cm) Weight: 134 lb 4.2 oz (60.9 kg) IBW/kg (Calculated) : 48.95 Heparin Dosing Weight:   Vital Signs: Temp: 98.1 F (36.7 C) (02/19 0654) Temp src: Oral (02/19 0654) BP: 170/118 mmHg (02/19 0654) Pulse Rate: 61 (02/19 0654)  Labs:  Recent Labs  10/08/12 0723 10/09/12 0615 10/10/12 0613  HGB 10.0* 9.9*  --   HCT 30.4* 29.9*  --   PLT 204 183  --   LABPROT 35.2* 29.3* 25.9*  INR 3.80* 2.96* 2.51*  CREATININE 0.97 0.89  --     Estimated Creatinine Clearance: 47.8 ml/min (by C-G formula based on Cr of 0.89).   Medications:  Scheduled:  . amoxicillin  250 mg Oral Q8H  . feeding supplement  237 mL Oral TID BM  . fludrocortisone  0.1 mg Oral QODAY  . insulin aspart  0-15 Units Subcutaneous TID WC  . insulin aspart  0-5 Units Subcutaneous QHS  . insulin glargine  20 Units Subcutaneous Daily  . levothyroxine  150 mcg Oral Custom  . levothyroxine  175 mcg Oral Custom  . multivitamin with minerals  1 tablet Oral Daily  . mycophenolate  360 mg Oral BID  . pantoprazole  40 mg Oral Daily  . predniSONE  5 mg Oral Daily  . QUEtiapine  25 mg Oral QHS  . tacrolimus  1 mg Oral QHS  . tacrolimus  2 mg Oral Daily  . [COMPLETED] warfarin  2.5 mg Oral ONCE-1800  . Warfarin - Pharmacist Dosing Inpatient   Does not apply q1800  . [DISCONTINUED] QUEtiapine  50 mg Oral QHS   Infusions:    Assessment: 72 yo female with afib/CVA/DVT is currently on therapeutic coumadin.  Patient was at goal on 5mg  daily for over a week but cipro was introduced and that explains the supratherapeutic level few days ago.  Off cipro now. Goal of Therapy:  INR 2-3    Plan:  1) Coumadin 5mg  po x1 2) INR in am.  Daziyah Cogan, [name] 10/10/2012,8:36 AM

## 2012-10-11 ENCOUNTER — Inpatient Hospital Stay (HOSPITAL_COMMUNITY): Payer: Medicare Other

## 2012-10-11 ENCOUNTER — Inpatient Hospital Stay (HOSPITAL_COMMUNITY): Payer: Medicare Other | Admitting: Physical Therapy

## 2012-10-11 ENCOUNTER — Inpatient Hospital Stay (HOSPITAL_COMMUNITY): Payer: Medicare Other | Admitting: Speech Pathology

## 2012-10-11 LAB — GLUCOSE, CAPILLARY
Glucose-Capillary: 183 mg/dL — ABNORMAL HIGH (ref 70–99)
Glucose-Capillary: 148 mg/dL — ABNORMAL HIGH (ref 70–99)

## 2012-10-11 LAB — PROTIME-INR: Prothrombin Time: 23.4 seconds — ABNORMAL HIGH (ref 11.6–15.2)

## 2012-10-11 MED ORDER — INSULIN GLARGINE 100 UNIT/ML ~~LOC~~ SOLN
5.0000 [IU] | Freq: Every day | SUBCUTANEOUS | Status: DC
  Administered 2012-10-11 – 2012-10-12 (×2): 5 [IU] via SUBCUTANEOUS

## 2012-10-11 MED ORDER — INSULIN GLARGINE 100 UNIT/ML ~~LOC~~ SOLN: 20.0000 [IU] | Freq: Every day | SUBCUTANEOUS | Status: DC

## 2012-10-11 MED ORDER — INSULIN GLARGINE 100 UNIT/ML ~~LOC~~ SOLN
20.0000 [IU] | Freq: Every day | SUBCUTANEOUS | Status: DC
  Administered 2012-10-11: 22:00:00 via SUBCUTANEOUS

## 2012-10-11 MED ORDER — TRAZODONE HCL 50 MG PO TABS
50.0000 mg | ORAL_TABLET | Freq: Every day | ORAL | Status: DC
  Administered 2012-10-11: 50 mg via ORAL
  Filled 2012-10-11: qty 1

## 2012-10-11 MED ORDER — WARFARIN SODIUM 5 MG PO TABS
5.0000 mg | ORAL_TABLET | Freq: Once | ORAL | Status: AC
  Administered 2012-10-11: 5 mg via ORAL
  Filled 2012-10-11: qty 1

## 2012-10-11 NOTE — Progress Notes (Signed)
Subjective/Complaints: More alert during day. Slept well at night. A 12 point review of systems has been performed and if not noted above is otherwise negative.   Objective:   Vital Signs: Blood pressure 169/83, pulse 59, temperature 98.9 F (37.2 C), temperature source Oral, resp. rate 19, height 5' 1.5" (1.562 m), weight 67.8 kg (149 lb 7.6 oz), SpO2 95.00%. No results found.  Recent Labs  10/09/12 0615  WBC 7.9  HGB 9.9*  HCT 29.9*  PLT 183    Recent Labs  10/09/12 0615  NA 137  K 3.9  CL 99  GLUCOSE 189*  BUN 19  CREATININE 0.89  CALCIUM 9.0   CBG (last 3)   Recent Labs  10/10/12 2039 10/11/12 0239 10/11/12 0711  GLUCAP 281* 183* 192*    Wt Readings from Last 3 Encounters:  10/10/12 67.8 kg (149 lb 7.6 oz)  09/27/12 64.8 kg (142 lb 13.7 oz)  09/04/12 66.225 kg (146 lb)    Physical Exam:  .Vitals reviewed.  HENT:  Multiple bruising and bruising to facial area improving Eyes: EOM are normal.  Neck: Normal range of motion. Neck supple. No thyromegaly present.  Cardiovascular:  Cardiac rate controlled. No murmur.  Pulmonary/Chest: Effort normal and breath sounds normal. No respiratory distress.  Abdominal: Soft. Bowel sounds are normal. She exhibits no distension. No tenderness Neurological:sleeping but awakens. Follows simple commands  Motor strength is 4/5 in bilateral biceps, triceps, wrist flexors and extension and grip as well as hip flexors knee extensors ankle dorsiflexors and plantar flexors  Sensory exam is normal in the upper extremities but appears diminished in the distal right lowe limb.  Has difficulty with left finger nose to finger  Left field cut on  Gaze conjugate. Appears to have decreased visual acuity as a whole.  Wearing AFO right lower ext with movement noted within brace.  Skin: multiple bruises and contusions noted throughout. Left BKA stump intact. Has redundant tissue on the end.   Assessment/Plan: 1. Functional deficits  secondary to TBI which require 3+ hours per day of interdisciplinary therapy in a comprehensive inpatient rehab setting. Physiatrist is providing close team supervision and 24 hour management of active medical problems listed below. Physiatrist and rehab team continue to assess barriers to discharge/monitor patient progress toward functional and medical goals.  Spoke with husband at length regarding her presentation, TBI, etc. He is still planning on bringing her home  FIM: FIM - Bathing Bathing Steps Patient Completed: Chest;Right Arm;Left Arm;Abdomen;Front perineal area;Buttocks Bathing: 3: Mod-Patient completes 5-7 63f 10 parts or 50-74%  FIM - Upper Body Dressing/Undressing Upper body dressing/undressing steps patient completed: Thread/unthread right sleeve of pullover shirt/dresss;Thread/unthread left sleeve of pullover shirt/dress;Put head through opening of pull over shirt/dress;Pull shirt over trunk Upper body dressing/undressing: 5: Set-up assist to: Obtain clothing/put away FIM - Lower Body Dressing/Undressing Lower body dressing/undressing steps patient completed: Fasten/unfasten pants (pt dons skirt over her head and then pull down with S) Lower body dressing/undressing: 1: Total-Patient completed less than 25% of tasks  FIM - Toileting Toileting steps completed by patient: Adjust clothing prior to toileting;Adjust clothing after toileting;Performs perineal hygiene Toileting Assistive Devices: Grab bar or rail for support Toileting: 4: Steadying assist  FIM - Diplomatic Services operational officer Devices: Building control surveyor Transfers: 3-To toilet/BSC: Mod A (lift or lower assist);3-From toilet/BSC: Mod A (lift or lower assist)  FIM - Bed/Chair Transfer Bed/Chair Transfer Assistive Devices: Bed rails;Arm rests;HOB elevated;Prosthesis;Orthosis Bed/Chair Transfer: 2: Supine > Sit: Max A (lifting assist/Pt.  25-49%);2: Sit > Supine: Max A (lifting assist/Pt.  25-49%);2: Bed > Chair or W/C: Max A (lift and lower assist);2: Chair or W/C > Bed: Max A (lift and lower assist)  FIM - Locomotion: Wheelchair Locomotion: Wheelchair: 1: Travels less than 50 ft with minimal assistance (Pt.>75%) FIM - Locomotion: Ambulation Locomotion: Ambulation Assistive Devices: Designer, industrial/product Ambulation/Gait Assistance: 4: Min assist Locomotion: Ambulation: 1: Travels less than 50 ft with minimal assistance (Pt.>75%)  Comprehension Comprehension Mode: Auditory Comprehension: 3-Understands basic 50 - 74% of the time/requires cueing 25 - 50%  of the time  Expression Expression Mode: Verbal Expression: 2-Expresses basic 25 - 49% of the time/requires cueing 50 - 75% of the time. Uses single words/gestures.  Social Interaction Social Interaction: 2-Interacts appropriately 25 - 49% of time - Needs frequent redirection.  Problem Solving Problem Solving: 2-Solves basic 25 - 49% of the time - needs direction more than half the time to initiate, plan or complete simple activities  Memory Memory: 2-Recognizes or recalls 25 - 49% of the time/requires cueing 51 - 75% of the time  Medical Problem List and Plan:  1. 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 DVT Prophylaxis/Anticoagulation: Chronic Coumadin for a true fibrillation. Monitor for any bleeding episodes  3. Pain Management: Ultram as needed. Pain control fair at present 4. Mood/underlying dementia.   Cognition likely a major factor in her falls and gait disorder PTA.  5. Neuropsych/acute deliriumThis patient is not capable of making decisions on his/her own behalf.   -significant hypoglycemia certainly playing a roll  -change back to trazodone for sleep  -lab work and head CT unremarkable, AM cortisol normal  - Thyroid studies slightly low but don't need to be treated  -rxing uti 6. Multiple facial fractures. Conservative care per ENT  7. Chronic orthostatic hypotension.  Continue Florinef as directed for bp  8. History of CVA. Husband notes premorbid chronic visual problems related to stroke 2012  9. Renal transplant 2001. Prograf/prednisone  10. 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   -I have spoken with [facility]. She has preferred to keep the sleeve suspension system as it has been what she is accustomed to. She has had chronic distal redundant tissue which has been a challenge for fitting.  -she was due a new prosthesis before she had her stroke last year.  -outpt socket revision 11. Diabetes mellitus with peripheral neuropathy. Hemoglobin A1c 6.3.  -ivf stopped  -lantus resumed and increased to 20u yesterday. Will ad AM dose today  -continue checking blood sugars a.c. and at bedtime  12. Coronary artery disease/CABG. No chest pain or shortness of breath  13. Hypokalemia: supplemented 14. Enterococcus in urine: 50k -on cipro currently--low grade temp resolved  -tolerating amoxil better. Ate well yesterday   LOS (Days) 13 A FACE TO FACE EVALUATION WAS PERFORMED  Linder Prajapati T 10/11/2012 8:11 AM

## 2012-10-11 NOTE — Progress Notes (Signed)
Speech Language Pathology Session Note & Discharge Summary  Patient Details  Name: Elizabeth [name] MRN: [id number] Date of Birth: 1940-12-26  Today's Date: 10/11/2012 Time: 1000-1045 Time Calculation (min): 45 min  Skilled Therapeutic Intervention: Treatment focus on family education in regards to current cognitive function and strategies to utilize at home to increase working memory, safety and problem solving. Pt's husband verbalized understanding and handouts given.   Patient has met 1 of 4 long term goals.  Patient to discharge at overall Mod;Max level.   Reasons goals not met: Pt was making functional gains but began to demonstrate increased lethargy with intermittent confusion late last week. Pt continues to require Mod-Max A for cognitive function with basic tasks   Clinical Impression/Discharge Summary: Pt has made inconsistent functional gains and has met 1 of 4 LTG's this admission. Pt continues to require Mod-Max A for sustained attention, functional problem solving, working memory and emergent awareness of deficits. Pt/family education complete and pt will discharge home with 24 hour supervision. Recommend f/u home health skilled SLP intervention to maximize cognitive function and overall functional independence.   Care Partner:  Caregiver Able to Provide Assistance: Yes  Type of Caregiver Assistance: Physical;Cognitive  Recommendation:  Home Health SLP;24 hour supervision/assistance  Rationale for SLP Follow Up: Maximize cognitive function and independence;Reduce caregiver burden   Equipment: N/A   Reasons for discharge: Discharged from hospital   Patient/Family Agrees with Progress Made and Goals Achieved: Yes   See FIM for current functional status  Margia Wiesen 10/11/2012, 4:36 PM

## 2012-10-11 NOTE — Patient Care Conference (Signed)
Inpatient RehabilitationTeam Conference and Plan of Care Update Date: 10/09/2012   Time: 2:55 PM    Patient Name: Elizabeth [name]      Medical Record Number: [id number]  Date of Birth: 06-Apr-1941 Sex: Female         Room/Bed: 4025/4025-01 Payor Info: Payor: MEDICARE  Plan: MEDICARE PART A AND B  Product Type: *No Product type*     Admitting Diagnosis: TBI  old bka  old cva  Admit Date/Time:  09/28/2012  3:35 PM Admission Comments: No comment available   Primary Diagnosis:  TBI (traumatic brain injury) Principal Problem: TBI (traumatic brain injury)  Patient Active Problem List   Diagnosis Date Noted  . TBI (traumatic brain injury) 09/30/2012  . UTI (lower urinary tract infection) 09/27/2012  . Hypoglycemia 09/26/2012  . Encephalopathy 09/26/2012  . Fall 09/21/2012  . Right orbit fracture 09/21/2012  . Zygoma fracture 09/21/2012  . Fracture of right maxilla 09/21/2012  . Concussion, unspecified 09/21/2012  . 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 Orthostatic hypotension 06/06/2012  . 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 Constipation 10/11/2011  . Nocturnal polyuria 03/31/2011  . Atrial fibrillation 01/28/2011  . Encounter for long-term (current) use of anticoagulants 01/28/2011  . CVA (cerebral vascular accident) 01/28/2011  . DVT of upper extremity (deep vein thrombosis) 01/28/2011  . Edema 01/10/2011  . SINUS BRADYCARDIA 12/14/2009  . GERD 12/08/2009  . DIZZINESS 05/20/2009  . DIABETES MELLITUS, TYPE II 02/11/2009  . HYPERLIPIDEMIA-MIXED 02/09/2009  . HYPERTENSION, BENIGN 02/09/2009  . CAD, NATIVE VESSEL 02/09/2009  . CAROTID BRUITS, BILATERAL 02/09/2009  . 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 Irritable bowel syndrome 08/18/2008  . COLONIC POLYPS, HX OF 08/18/2008    Expected Discharge Date: Expected Discharge Date:  (SNF)  Team Members Present: Physician leading conference: Dr. Faith Rogue Social Worker Present: Amada Jupiter, LCSW Nurse Present: Laural Roes, RN PT Present: Reggy Eye, PT OT Present: Ardis Rowan, Corky Crafts, OT;Jennifer Katrinka Blazing, OT SLP Present: Feliberto Gottron, SLP Other (Discipline and Name): Tora Duck, PPS Coordinator     Current Status/Progress Goal Weekly Team Focus  Medical   improved sleep, day time arousal a little better. UTI  normalize sleep wake pattern, optimize mentation  rx uti, wean medications, family ed   Bowel/Bladder   Continent of bladder/bowel. Occassional incontinence due to urgency and mobility(prothesis of left bka, and right leg brace)  Min assist with tolieting and remaining continent  Timed toileting every 2-3 hrs and prn   Swallow/Nutrition/ Hydration             ADL's   min to mod A  supevision to min A  family education, attention, standing balance and sit to stand   Mobility   mod A  supervision  transfers, activity tolerance   Communication             Safety/Cognition/ Behavioral Observations  Max-Total A  Min A  attention, problem solving, safety awareness    Pain   n/a  3 or less  medicate prn   Skin   Bruising to right side-face,eye,flank,leg, foot  No new breakdown/skin tears       Rehab Goals Patient on target to meet rehab goals: No *See Interdisciplinary Assessment and Plan and progress notes for long and short-term goals  Barriers to Discharge: cognitive status, substantial care needs    Possible Resolutions to Barriers:  education of husband, see prior    Discharge Planning/Teaching Needs:  Plan has  been for pt to d/c home with husband who will provide 24/7 assistance, however, limited improvements and will plan to meet with husband after conference to determine if this is still plan vs SNF      Team Discussion:  Alertness and cognition status continue to fluctuate.  MD considering any causes for this (i.e. UTI), however, anticipate that her status will continue to fluctuate and  concern over husband's ability to care for her at home.  Today having to be fed and at moderate assist.  SW to speak with husband about concerns and team recommendation that he consider SNF.  Revisions to Treatment Plan:  Possible change in d/c plan to SNF?   Continued Need for Acute Rehabilitation Level of Care: The patient requires daily medical management by a physician with specialized training in physical medicine and rehabilitation for the following conditions: Daily medical management of patient stability for increased activity during participation in an intensive rehabilitation regime.: Yes Daily analysis of laboratory values and/or radiology reports with any subsequent need for medication adjustment of medical intervention for : Neurological problems (left bka, UTI)  Takhia Spoon 10/11/2012, 8:44 AM

## 2012-10-11 NOTE — Progress Notes (Signed)
Physical Therapy Note  Patient Details  Name: MARGARIT MINSHALL MRN: [id number] Date of Birth: [birth year] Today's Date: 10/11/2012  Time: 1100-1135 35 minutes  No c/o pain.  Treatment session focused on pt/family education with pt's husband.  Pt/husband safely demo'd gait, transfers, toilet transfers, toileting and car transfers at min A level.  Pt's husband with good safety awareness and states that she "is doing better" than when at home before.  Pt/husband express they feel comfortable with d/c home at this level of care.  Individual therapy   DONAWERTH,KAREN 10/11/2012, 11:34 AM

## 2012-10-11 NOTE — Plan of Care (Signed)
Problem: RH SKIN INTEGRITY Goal: RH STG MAINTAIN SKIN INTEGRITY WITH ASSISTANCE STG Maintain Skin Integrity With mod Assistance.  Outcome: Not Progressing Patient requires max assist to manage right heel wound.  Goal: RH STG ABLE TO PERFORM INCISION/WOUND CARE W/ASSISTANCE STG Able To Perform Incision/Wound Care With mod Assistance.  Outcome: Not Progressing Patient requires max assist with wound care

## 2012-10-11 NOTE — Progress Notes (Signed)
Occupational Therapy Session Note  Patient Details  Name: Elizabeth [name] MRN: [id number] Date of Birth: [birth year]  Today's Date: 10/11/2012 Time: 0900-0955 Time Calculation (min): 55 min  Short Term Goals: Week 2:  OT Short Term Goal 1 (Week 2): STG=LTG  Skilled Therapeutic Interventions/Progress Updates:    Pt seated on BSC with husband and RN present.  Husband assisted pt to w/c.  Pt engaged in bathing and dressing tasks with sit<>stand from w/c at sink.  Pt's husband stated that pt typically bathed at sink with showers as needed.  Pt's husband assisted pt throughout session with bathing and dressing tasks.  Pt's husband provided appropriate verbal cues and physical assistance.  Pt's husband stated that he was comfortable with level of assistance he provided and he had no concerns with discharge tomorrow.  Therapy Documentation Precautions:  Precautions Precautions: Fall Precaution Comments: Lt BKA Required Braces or Orthoses: Other Brace/Splint Other Brace/Splint: R KAFO, Left prosthesis for BKA Restrictions Weight Bearing Restrictions: No  See FIM for current functional status  Therapy/Group: Individual Therapy  Rich Brave 10/11/2012, 11:29 AM

## 2012-10-11 NOTE — Discharge Summary (Signed)
  discharge summary job # [id number]

## 2012-10-11 NOTE — Progress Notes (Signed)
ANTICOAGULATION CONSULT NOTE - Follow Up Consult  Pharmacy Consult for coumadin Indication: afib/CVA/DVT  Allergies  Allergen Reactions  . Percocet (Oxycodone-Acetaminophen) Nausea And Vomiting    Patient Measurements: Height: 5' 1.5" (156.2 cm) Weight: 149 lb 7.6 oz (67.8 kg) IBW/kg (Calculated) : 48.95 Heparin Dosing Weight:   Vital Signs: Temp: 98.9 F (37.2 C) (02/20 0617) Temp src: Oral (02/20 0617) BP: 169/83 mmHg (02/20 0617) Pulse Rate: 59 (02/20 0617)  Labs:  Recent Labs  10/09/12 0615 10/10/12 0613 10/11/12 0706  HGB 9.9*  --   --   HCT 29.9*  --   --   PLT 183  --   --   LABPROT 29.3* 25.9* 23.4*  INR 2.96* 2.51* 2.19*  CREATININE 0.89  --   --     Estimated Creatinine Clearance: 50.2 ml/min (by C-G formula based on Cr of 0.89).   Medications:  Scheduled:  . amoxicillin  250 mg Oral Q8H  . feeding supplement  237 mL Oral TID BM  . fludrocortisone  0.1 mg Oral QODAY  . insulin aspart  0-15 Units Subcutaneous TID WC  . insulin aspart  0-5 Units Subcutaneous QHS  . [START ON 10/12/2012] insulin glargine  20 Units Subcutaneous QHS  . insulin glargine  5 Units Subcutaneous Daily  . levothyroxine  150 mcg Oral Custom  . levothyroxine  175 mcg Oral Custom  . multivitamin with minerals  1 tablet Oral Daily  . mycophenolate  360 mg Oral BID  . pantoprazole  40 mg Oral Daily  . predniSONE  5 mg Oral Daily  . tacrolimus  1 mg Oral QHS  . tacrolimus  2 mg Oral Daily  . traZODone  50 mg Oral QHS  . [COMPLETED] warfarin  5 mg Oral ONCE-1800  . Warfarin - Pharmacist Dosing Inpatient   Does not apply q1800  . [DISCONTINUED] insulin glargine  20 Units Subcutaneous Daily  . [DISCONTINUED] QUEtiapine  25 mg Oral QHS   Infusions:    Assessment: 72 yo female with afib/CVA/DVT is currently on therapeutic coumadin.  INR is 2.19. Goal of Therapy:  INR 2-3  Plan:  1) Coumadin 5mg  po x1 2) Daily INR  Elizabeth [name], [name] 10/11/2012,8:31 AM

## 2012-10-12 ENCOUNTER — Ambulatory Visit (HOSPITAL_COMMUNITY): Payer: Medicare Other

## 2012-10-12 DIAGNOSIS — E1165 Type 2 diabetes mellitus with hyperglycemia: Secondary | ICD-10-CM

## 2012-10-12 DIAGNOSIS — W19XXXA Unspecified fall, initial encounter: Secondary | ICD-10-CM

## 2012-10-12 DIAGNOSIS — S069X9A Unspecified intracranial injury with loss of consciousness of unspecified duration, initial encounter: Secondary | ICD-10-CM

## 2012-10-12 DIAGNOSIS — I951 Orthostatic hypotension: Secondary | ICD-10-CM

## 2012-10-12 DIAGNOSIS — S78119A Complete traumatic amputation at level between unspecified hip and knee, initial encounter: Secondary | ICD-10-CM

## 2012-10-12 LAB — GLUCOSE, CAPILLARY
Glucose-Capillary: 73 mg/dL (ref 70–99)
Glucose-Capillary: 343 mg/dL — ABNORMAL HIGH (ref 70–99)

## 2012-10-12 LAB — PROTIME-INR
INR: 2.21 — ABNORMAL HIGH (ref 0.00–1.49)
Prothrombin Time: 23.6 seconds — ABNORMAL HIGH (ref 11.6–15.2)

## 2012-10-12 MED ORDER — LEVOTHYROXINE SODIUM 150 MCG PO TABS: 150.0000 ug | ORAL_TABLET | Freq: Every day | ORAL | Status: DC

## 2012-10-12 MED ORDER — FLUDROCORTISONE ACETATE 0.1 MG PO TABS: 0.1000 mg | ORAL_TABLET | ORAL | Status: DC

## 2012-10-12 MED ORDER — WARFARIN SODIUM 5 MG PO TABS
5.0000 mg | ORAL_TABLET | Freq: Once | ORAL | Status: DC
  Filled 2012-10-12: qty 1

## 2012-10-12 MED ORDER — INSULIN GLARGINE 100 UNIT/ML ~~LOC~~ SOLN: 5.0000 [IU] | Freq: Every day | SUBCUTANEOUS | Status: DC

## 2012-10-12 MED ORDER — INSULIN GLARGINE 100 UNIT/ML ~~LOC~~ SOLN: 20.0000 [IU] | Freq: Every day | SUBCUTANEOUS | Status: DC

## 2012-10-12 MED ORDER — WARFARIN SODIUM 2.5 MG PO TABS: 5.0000 mg | ORAL_TABLET | Freq: Every evening | ORAL | Status: DC

## 2012-10-12 MED ORDER — TRAZODONE HCL 50 MG PO TABS: 50.0000 mg | ORAL_TABLET | Freq: Every day | ORAL | Status: DC

## 2012-10-12 MED ORDER — ATORVASTATIN CALCIUM 10 MG PO TABS: 10.0000 mg | ORAL_TABLET | ORAL | Status: DC

## 2012-10-12 MED ORDER — TRAMADOL HCL 50 MG PO TABS: 25.0000 mg | ORAL_TABLET | Freq: Four times a day (QID) | ORAL | Status: DC | PRN

## 2012-10-12 MED ORDER — LEVOTHYROXINE SODIUM 150 MCG PO TABS: 150.0000 ug | ORAL_TABLET | ORAL | Status: DC

## 2012-10-12 NOTE — Progress Notes (Signed)
Physical Therapy Discharge Summary  Patient Details  Name: Elizabeth [name] MRN: [id number] Date of Birth: 06-02-1941  Today's Date: 10/12/2012  Patient has met 4 of 7 long term goals due to improved activity tolerance, improved balance, improved postural control and ability to compensate for deficits.  Patient to discharge at an ambulatory level Min Assist.   Patient's care partner is independent to provide the necessary physical and cognitive assistance at discharge.  Reasons goals not met: pt continues to require min A for gait training and car transfers as well as for sit to stand.  Recommendation:  Patient will benefit from ongoing skilled PT services in home health setting to continue to advance safe functional mobility, address ongoing impairments in strength, balance, gait, and minimize fall risk.  Equipment: No equipment provided  Reasons for discharge: treatment goals met and discharge from hospital  Patient/family agrees with progress made and goals achieved: Yes   See FIM for current functional status  Shi Blankenship 10/12/2012, 3:57 PM

## 2012-10-12 NOTE — Progress Notes (Signed)
Occupational Therapy Discharge Summary  Patient Details  Name: Elizabeth [name] MRN: [id number] Date of Birth: 09-Sep-1940  Today's Date: 10/12/2012    Patient has met 5 of 10 long term goals due to improved activity tolerance, improved balance, postural control, improved attention and improved awareness.  Patient to discharge at overall supervision to min A level for basic ADL tasks (at sink level), sit to stands, standing balance and functional ambulation with RW.  Pt's cognitive progress has been slow and is overall mod A; pt continues to have hallucinations at times. Patient's care partner (husband) is independent to provide the necessary physical and cognitive assistance at discharge.    Reasons goals not met: Pt still requires min A due to cognition with all functional mobility to remain safe during ADL tasks. Pt with difficulty with anterior weight shift with sit to stands and standing balance  Recommendation:  Patient will benefit from ongoing skilled OT services in home health setting to continue to advance functional skills in the area of BADL and Reduce care partner burden.  Equipment: No equipment provided  Reasons for discharge: discharge from hospital  Patient/family agrees with progress made and goals achieved: Yes  OT Discharge Precautions/Restrictions   fall Pain  still c/o dizziness at time and soreness but at no specific site ADL See FIM Vision/Perception - continues to demonstrate decreased attention to left visual field and body at times.  Vision - Assessment Tracking/Visual Pursuits: Requires cues, head turns, or add eye shifts to track Perception Perception: Impaired Inattention/Neglect: Does not attend to left side of body;Does not attend to right side of body Praxis Praxis: Intact  Cognition Overall Cognitive Status: Impaired at baseline Arousal/Alertness: Lethargic Orientation Level: Oriented to place;Oriented to situation;Disoriented to time;Oriented  to person Attention: Sustained Focused Attention: Appears intact Sustained Attention: Impaired Sustained Attention Impairment: Verbal basic;Functional basic Memory: Impaired Memory Impairment: Decreased recall of new information;Decreased short term memory Decreased Short Term Memory: Verbal basic;Functional basic Awareness: Impaired Awareness Impairment: Intellectual impairment Problem Solving: Impaired Problem Solving Impairment: Verbal basic;Functional basic Executive Function: Initiating;Reasoning;Self Monitoring;Self Correcting Reasoning: Impaired Reasoning Impairment: Verbal basic;Functional basic Initiating: Impaired Initiating Impairment: Verbal basic;Functional basic Self Monitoring: Impaired Self Monitoring Impairment: Functional basic;Verbal basic Self Correcting: Impaired Self Correcting Impairment: Verbal basic;Functional basic Safety/Judgment: Impaired Rancho Mirant Scales of Cognitive Functioning: Confused/appropriate Sensation Sensation Light Touch: Appears Intact Proprioception: Appears Intact Coordination Fine Motor Movements are Fluid and Coordinated: No Motor  Motor Motor: Abnormal postural alignment and control Mobility    see FIM Trunk/Postural Assessment  Cervical Assessment Cervical Assessment: Within Functional Limits Thoracic Assessment Thoracic Assessment: Within Functional Limits, falls to the left at times Lumbar Assessment Lumbar Assessment: Exceptions to [facility] posterior pelvic tilt Postural Control Postural Control: Deficits, posterior lean in unsupported sitting with increased time, fearful of remaining with a anterior forward weight shift Balance Static Standing Balance Static Standing - Level of Assistance: 5: Stand by assistance;4: Min assist Dynamic Standing Balance Dynamic Standing - Level of Assistance: 4: Min assist Extremity/Trunk Assessment RUE Assessment RUE Assessment: Within Functional Limits LUE Assessment LUE  Assessment: Within Functional Limits (some deficits functionally at time due to old CVA)- decreased awareness of left UE at times See FIM for current functional status  Roney Mans [facility] 10/12/2012, 12:13 PM

## 2012-10-12 NOTE — Plan of Care (Signed)
Problem: RH SKIN INTEGRITY Goal: RH STG SKIN FREE OF INFECTION/BREAKDOWN Skin free of infection/breakdown at discharge  Outcome: Not Met (add Reason) Patient continues to have stage II ulcer on right heel . Reviewed with patient's husband how to clean and apply dressing every 3-5 days with allevyn dressing .   Problem: RH KNOWLEDGE DEFICIT Goal: RH STG INCREASE KNOWLEDGE OF DIABETES Patient and caregiver will demo knowledge of diabetic meds being taken now, s/s of hypoglycemia and hyperglycemia, and when to notify MD  Outcome: Completed/Met Date Met:  10/12/12 Patient's husband able to verbalize and demonstrate knowledge of diabetes

## 2012-10-12 NOTE — Progress Notes (Signed)
ANTICOAGULATION CONSULT NOTE - Follow Up Consult  Pharmacy Consult for coumadin Indication: afib/CVA/DVT  Allergies  Allergen Reactions  . Percocet (Oxycodone-Acetaminophen) Nausea And Vomiting    Patient Measurements: Height: 5' 1.5" (156.2 cm) Weight: 149 lb 7.6 oz (67.8 kg) IBW/kg (Calculated) : 48.95 Heparin Dosing Weight:   Vital Signs: Temp: 97.8 F (36.6 C) (02/21 0530) Temp src: Oral (02/21 0530) BP: 174/68 mmHg (02/21 0530) Pulse Rate: 62 (02/21 0530)  Labs:  Recent Labs  10/10/12 0613 10/11/12 0706 10/12/12 0600  LABPROT 25.9* 23.4* 23.6*  INR 2.51* 2.19* 2.21*    Estimated Creatinine Clearance: 50.2 ml/min (by C-G formula based on Cr of 0.89).   Medications:  Scheduled:  . amoxicillin  250 mg Oral Q8H  . feeding supplement  237 mL Oral TID BM  . fludrocortisone  0.1 mg Oral QODAY  . insulin aspart  0-15 Units Subcutaneous TID WC  . insulin aspart  0-5 Units Subcutaneous QHS  . insulin glargine  20 Units Subcutaneous QHS  . insulin glargine  5 Units Subcutaneous Daily  . levothyroxine  150 mcg Oral Custom  . levothyroxine  175 mcg Oral Custom  . multivitamin with minerals  1 tablet Oral Daily  . mycophenolate  360 mg Oral BID  . pantoprazole  40 mg Oral Daily  . predniSONE  5 mg Oral Daily  . tacrolimus  1 mg Oral QHS  . tacrolimus  2 mg Oral Daily  . traZODone  50 mg Oral QHS  . [COMPLETED] warfarin  5 mg Oral ONCE-1800  . Warfarin - Pharmacist Dosing Inpatient   Does not apply q1800  . [DISCONTINUED] insulin glargine  20 Units Subcutaneous QHS   Infusions:    Assessment: 72 yo female with afib/CVA/DVT is currently on therapeutic coumadin.  INR today is 2.21 Goal of Therapy:  INR 2-3    Plan:  1) Coumadin 5mg  po x1 2) INR in am.  Juno Alers, [name] 10/12/2012,8:40 AM

## 2012-10-12 NOTE — Progress Notes (Signed)
Patient discharged to home with husband at 1250 with all belongings . Discharge instructions given and reviewed by D. Anguilli , PA . Reviewed and demonstrated right heel wound care. Patient 's husband able to change dressing and provide skin care . Continue with plan of  Care .              Elizabeth [name]

## 2012-10-12 NOTE — Progress Notes (Signed)
Social Work Discharge Note  The overall goal for the admission was met for:   Discharge location: Yes - home with spouse  Length of Stay: Yes - 14 days  Discharge activity level: Yes - minimal assistance  Home/community participation: Yes  Services provided included: MD, RD, PT, OT, SLP, RN, TR, Pharmacy, Neuropsych and SW  Financial Services: Medicare and Private Insurance: Community education officer  Follow-up services arranged: Home Health: RN, PT, OT, ST via [facility] and Patient/Family has no preference for HH/DME agencies  Comments (or additional information):  Patient/Family verbalized understanding of follow-up arrangements: Yes  Individual responsible for coordination of the follow-up plan: spouse  Confirmed correct DME delivered: NA    Tashica Provencio

## 2012-10-12 NOTE — Progress Notes (Signed)
Subjective/Complaints: Did well last night. No complaints today.  A 12 point review of systems has been performed and if not noted above is otherwise negative.   Objective:   Vital Signs: Blood pressure 174/68, pulse 62, temperature 97.8 F (36.6 C), temperature source Oral, resp. rate 18, height 5' 1.5" (1.562 m), weight 67.8 kg (149 lb 7.6 oz), SpO2 94.00%. No results found. No results found for this basename: WBC, HGB, HCT, PLT,  in the last 72 hours No results found for this basename: NA, K, CL, CO, GLUCOSE, BUN, CREATININE, CALCIUM,  in the last 72 hours CBG (last 3)   Recent Labs  10/11/12 1649 10/11/12 2024 10/12/12 0713  GLUCAP 272* 263* 73    Wt Readings from Last 3 Encounters:  10/10/12 67.8 kg (149 lb 7.6 oz)  09/27/12 64.8 kg (142 lb 13.7 oz)  09/04/12 66.225 kg (146 lb)    Physical Exam:  .Vitals reviewed.  HENT:  Multiple bruising and bruising to facial area substantially improved Eyes: EOM are normal.  Neck: Normal range of motion. Neck supple. No thyromegaly present.  Cardiovascular:  Cardiac rate controlled. No murmur.  Pulmonary/Chest: Effort normal and breath sounds normal. No respiratory distress.  Abdominal: Soft. Bowel sounds are normal. She exhibits no distension. No tenderness Neurological:very alert. Still with poor memory Motor strength is 4/5 in bilateral biceps, triceps, wrist flexors and extension and grip as well as hip flexors knee extensors ankle dorsiflexors and plantar flexors  Sensory exam is normal in the upper extremities but appears diminished in the distal right lowe limb.  Has difficulty with left finger nose to finger  Left field cut on  Gaze conjugate. Decreased visual acuity.  Wearing AFO right lower ext with movement noted within brace.  Skin: multiple bruises and contusions noted throughout. Left BKA stump intact. Has redundant tissue on the end.   Assessment/Plan: 1. Functional deficits secondary to TBI which require 3+  hours per day of interdisciplinary therapy in a comprehensive inpatient rehab setting. Physiatrist is providing close team supervision and 24 hour management of active medical problems listed below. Physiatrist and rehab team continue to assess barriers to discharge/monitor patient progress toward functional and medical goals.  Family ed completed. Home today with HH follow up  FIM: FIM - Bathing Bathing Steps Patient Completed: Chest;Right Arm;Left Arm;Abdomen;Front perineal area;Buttocks Bathing: 3: Mod-Patient completes 5-7 34f 10 parts or 50-74%  FIM - Upper Body Dressing/Undressing Upper body dressing/undressing steps patient completed: Thread/unthread right sleeve of pullover shirt/dresss;Thread/unthread left sleeve of pullover shirt/dress;Put head through opening of pull over shirt/dress;Pull shirt over trunk Upper body dressing/undressing: 5: Supervision: Safety issues/verbal cues FIM - Lower Body Dressing/Undressing Lower body dressing/undressing steps patient completed: Fasten/unfasten pants (pt dons skirt over her head and then pull down with S) Lower body dressing/undressing: 1: Total-Patient completed less than 25% of tasks  FIM - Toileting Toileting steps completed by patient: Performs perineal hygiene Toileting Assistive Devices: Grab bar or rail for support Toileting: 2: Max-Patient completed 1 of 3 steps  FIM - Diplomatic Services operational officer Devices: Best boy Transfers: 3-To toilet/BSC: Mod A (lift or lower assist);3-From toilet/BSC: Mod A (lift or lower assist)  FIM - Bed/Chair Transfer Bed/Chair Transfer Assistive Devices: Bed rails;Arm rests;HOB elevated;Prosthesis;Orthosis Bed/Chair Transfer: 4: Supine > Sit: Min A (steadying Pt. > 75%/lift 1 leg);4: Sit > Supine: Min A (steadying pt. > 75%/lift 1 leg);3: Bed > Chair or W/C: Mod A (lift or lower assist);3: Chair or W/C > Bed: Mod A (  lift or lower assist)  FIM - Locomotion:  Wheelchair Locomotion: Wheelchair: 1: Travels less than 50 ft with minimal assistance (Pt.>75%) FIM - Locomotion: Ambulation Locomotion: Ambulation Assistive Devices: Designer, industrial/product Ambulation/Gait Assistance: 4: Min assist Locomotion: Ambulation: 2: Travels 50 - 149 ft with minimal assistance (Pt.>75%)  Comprehension Comprehension Mode: Auditory Comprehension: 3-Understands basic 50 - 74% of the time/requires cueing 25 - 50%  of the time  Expression Expression Mode: Verbal Expression: 3-Expresses basic 50 - 74% of the time/requires cueing 25 - 50% of the time. Needs to repeat parts of sentences.  Social Interaction Social Interaction: 2-Interacts appropriately 25 - 49% of time - Needs frequent redirection.  Problem Solving Problem Solving: 3-Solves basic 50 - 74% of the time/requires cueing 25 - 49% of the time  Memory Memory: 2-Recognizes or recalls 25 - 49% of the time/requires cueing 51 - 75% of the time  Medical Problem List and Plan:  1. 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 DVT Prophylaxis/Anticoagulation: Chronic Coumadin for a true fibrillation. Monitor for any bleeding episodes  3. Pain Management: Ultram as needed. Pain control fair at present 4. Mood/underlying dementia.   Cognition likely a major factor in her falls and gait disorder PTA.  5. Neuropsych/acute deliriumThis patient is not capable of making decisions on his/her own behalf.   -significant hypoglycemia certainly playing a roll  -changed back to trazodone for sleep-fairly alert today  -lab work and head CT unremarkable, AM cortisol normal  - Thyroid studies slightly low but don't need to be treated  -rxing uti as below 6. Multiple facial fractures. Conservative care per ENT  7. Chronic orthostatic hypotension. Continue Florinef as directed for bp  8. History of CVA. Husband notes premorbid chronic visual problems related to stroke 2012  9. Renal transplant 2001.  Prograf/prednisone  10. 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   -I have spoken with [facility]. She has preferred to keep the sleeve suspension system as it has been what she is accustomed to. She has had chronic distal redundant tissue which has been a challenge for fitting.  -she was due a new prosthesis before she had her stroke last year.  -outpt socket revision 11. Diabetes mellitus with peripheral neuropathy. Hemoglobin A1c 6.3.  -ivf stopped  -lantus resumed and increased to 20u yesterday. Adjust AM dosing as needed to better control PM sugars  -continue checking blood sugars a.c. and at bedtime  12. Coronary artery disease/CABG. No chest pain or shortness of breath  13. Hypokalemia: supplemented 14. Enterococcus in urine: 50k -  -amoxil to complete today   LOS (Days) 14 A FACE TO FACE EVALUATION WAS PERFORMED  Elizabeth [name] T 10/12/2012 7:38 AM

## 2012-10-12 NOTE — Discharge Summary (Signed)
Elizabeth [name], Elizabeth [name]            ACCOUNT NO.:  [id number]  MEDICAL RECORD NO.:  [id number]  LOCATION:  4025                         FACILITY:  [facility]  PHYSICIAN:  Ranelle Oyster, M.D.DATE OF BIRTH:  April 15, 1941  DATE OF ADMISSION:  09/28/2012 DATE OF DISCHARGE:  10/12/2012                              DISCHARGE SUMMARY   DISCHARGE DIAGNOSES: 1. Traumatic brain injury -- post concussive syndrome after fall. 2. Chronic Coumadin for atrial fibrillation. 3. Pain management. 4. Mood with underlying dementia. 5. Multiple facial fractures. 6. Chronic orthostatic hypotension. 7. History of cerebrovascular accident. 8. Renal transplant, 2001. 9. History of left below-knee amputation, 1992. 10.Diabetes mellitus with peripheral neuropathy. 11.Coronary artery disease with coronary artery bypass graft. 12.Enterococcus urinary tract infection.  HISTORY OF PRESENT ILLNESS:  This is a 72 year old female with history of coronary artery disease; atrial fibrillation, on chronic Coumadin therapy; as well as renal transplant in 2001 with left below knee amputation in 1992 while living in [place].  Admitted on September 20, 2012, after a fall that was unwitnessed with altered mental status.  MRI of the brain with no acute intracranial abnormalities noting areas of remote hemorrhagic encephalomalacia in the right occipital lobe and inferior less cerebellum.  CT of chest and abdomen negative.  CT of maxillofacial with fractures through the anterior and lateral walls of the right maxillary sinus and two fractures within the mid posterior right zygomatic arch and right orbital wall with conservative care per ENT.  Mental status continued to improve, although with some waxing and waning.  Suspect traumatic brain injury with concussive syndrome with underlying baseline dementia.  Followup MRI of the brain on September 23, 2012, again showing no acute abnormalities.  There was noted atrophy of white  matter disease with chronic microvascular ischemia.  The patient with history of orthostatic hypotension maintained on Florinef.  She remained on chronic Coumadin for atrial fibrillation with no bleeding episodes.  She was admitted for comprehensive rehab program.  PAST MEDICAL HISTORY:  See discharge diagnoses.  SOCIAL HISTORY:  Lives with spouse.  FUNCTIONAL HISTORY:  Prior to admission, she does not drive.  She is retired.  She needs minimal assist for toileting.  Functional status upon admission to rehab services was +2 total assist, ambulate 8 feet with two person handheld assistance.  PHYSICAL EXAMINATION:  VITAL SIGNS:  Blood pressure 134/53, pulse 73, temperature 98.4, respirations 16. GENERAL:  The patient was appropriate for age and place.  She needed some cues for day of the week.  She was limited as far as her medical history.  She followed simple commands, but was easily distracted. Sometimes it is to difficult to keep on task during exams.  She was irritable at times. HEENT:  Pupils reactive to light.  Multiple ecchymoses to the face and forehead. LUNGS:  Clear to auscultation. CARDIAC:  Rate controlled. ABDOMEN:  Soft, nontender, good bowel sounds.  REHABILITATION AND HOSPITAL COURSE:  The patient was admitted to inpatient rehab services with therapies initiated on a 3-hour daily basis consisting of physical therapy, occupational therapy, speech therapy, and rehabilitation nursing.  The following issues were addressed during the patient's rehabilitation stay.  Pertained to [name]'s traumatic brain injury,  concussive syndrome, followup MRI showing no acute abnormalities.  She remained on chronic Coumadin for atrial fibrillation with latest INR of 2.19 to be followed by Dr. Mariah Milling at [facility] at [phone], fax [phone] with a home health nurse arranged.  The patient with multiple facial fractures with conservative care per ENT.  Pain management  monitored closely with the use of Ultram.  She did have a history of renal transplant in 2001.  She remained on Prograf as well as prednisone.  History of left below-knee amputation in 1992.  She did have a prosthesis.  She did receive some modifications of her prosthesis by [facility].  Diabetes mellitus with some variables in her blood sugars.  Her Lantus insulin was adjusted.  She would followup with her primary care provider.  She had no chest pain or shortness of breath throughout her rehab course. She was completing her course of amoxicillin for an enterococcus urinary tract infection of 50,000 colony count.  The patient received weekly collaborative interdisciplinary team conferences to discuss estimated length of stay, family teaching, and any barriers to her discharge.  She was continent of bowel and bladder except for occasional accidents. Minimal-to-moderate assist for activities of daily living, moderate assist mobility with variables.  Full family teaching was completed with her husband.  There was concerns discussed with husband on her safety. He refused all skilled nursing facility with request for discharge to home.  DISCHARGE MEDICATIONS:  At the time of dictation included amoxicillin 250 mg p.o. every 8 hours x2 more days and stop; Florinef 0.1 mg every other day; Lantus insulin 5 units a.m. and 20 units at bedtime; Synthroid 150 mcg Monday, Tuesday, Wednesday, Thursday, and Friday and 175 mcg Saturday and Sunday; multivitamin daily; Myfortic 360 mg b.i.d.; Protonix 40 mg daily; prednisone 5 mg daily; Seroquel 25 mcg every 12 hours as needed for agitation; Prograf 2 mg daily and 1 mg at bedtime; Ultram 25 to 50 mg every 6 hours as needed for pain; Desyrel 50 mg at bedtime; Coumadin 5 mg daily adjusted accordingly for the INR of 2.0 to 3.0.  Her diet was a diabetic diet.  SPECIAL INSTRUCTIONS:  Home health nurse check INR on Monday, October 15, 2012;  results to Dr. Julien Nordmann of [facility] [phone], fax# [phone].  The patient would follow up with Dr. Faith Rogue at the outpatient rehab service office as advised, Dr. Terrial Rhodes as needed.  Home health therapies have been arranged.     Mariam Dollar, P.A.   ______________________________ Ranelle Oyster, M.D.    DA/MEDQ  D:  10/11/2012  T:  10/12/2012  Job:  [id number]  cc:   Lyndal Pulley Chales Salmon, M.D. Terrial Rhodes, M.D. Antonieta Iba, MD

## 2012-10-12 NOTE — Progress Notes (Signed)
Inpatient Diabetes Program Recommendations  AACE/ADA: New Consensus Statement on Inpatient Glycemic Control (2013)  Target Ranges:  Prepandial:   less than 140 mg/dL      Peak postprandial:   less than 180 mg/dL (1-2 hours)      Critically ill patients:  140 - 180 mg/dL  Results for Elizabeth [name], Elizabeth [name] (MRN [id number]) as of 10/12/2012 13:08  Ref. Range 10/12/2012 07:13 10/12/2012 11:50  Glucose-Capillary Latest Range: 70-99 mg/dL 73 409 (H)    Inpatient Diabetes Program Recommendations Insulin - Basal: Decrease Lantus by 5 units  Insulin - Meal Coverage: add Novolog 3 units with meals  Fasting CBG on the low side and postprandials elevated.   Thank you  Piedad Climes BSN, RN,CDE Inpatient Diabetes Coordinator [phone] (team pager)

## 2012-10-13 NOTE — Consult Note (Signed)
10/12/12  NEUROCOGNITIVE TESTING - [facility]   Elizabeth [name] is a 72 year old, right-handed, married Caucasian female, who denied experiencing any major cognitive issues at this time. Her husband disagreed. Given the nature and severity of Elizabeth [name]'s deficits, all of the clinical history was provided by her husband. The patient was referred for neuropsychological consultation given the possibility of cognitive sequelae subsequent to the current medical status and in order to assist in treatment planning.   Elizabeth [name] reported noticing fluctuating, but progressively worsening, cognitive problems since the patient suffered "3 strokes" approximately 2 years ago. Most recently, her thinking impairments have even worsened following a traumatic brain injury she suffered. During this event, the patient reportedly passed out owing to low blood sugar and hit her head. While the exact length of time she was unconscious is unclear, it was estimated to be anywhere between 30 minute to 1.5 hours. The patient also suffers from a significant degree of retrograde and posttraumatic amnesia regarding the incident.   Of note, Elizabeth [name] mentioned that the patient has been experiencing recurring, well-formed visual hallucinations of children that recently worsened following the initiation of amitriptyline. He also said that these symptoms were exacerbated in the past when she was prescribed an antipsychotic post stroke for behavioral control.  Lastly, upon questioning, Elizabeth [name] reported noticing a mild hand tremor. The presence of these symptoms makes the possibility of an underlying neurodegenerative condition possible, particularly Dementia with Lewy Bodies.   PROCEDURES: [3 units of 40981 on 10/12/12]  Diagnostic Interview Medical record review Behavioral observations  The following tests were performed during today's visit: Repeatable Battery for the  Assessment of Neuropsychological Status (RBANS, form A), Mini-Mental State Exam-2 (brief version), and the Geriatric Depression Scale (short form).  Test results are as follows:   Of note, certain tasks could not be administered due to certain physical and/or visual limitations.   MMSE-2 (brief) Raw Score = 11/16 Description = Impaired    RBANS Indices Scaled Score Percentile Description  Immediate Memory  49 <1 Markedly impaired  Visuospatial/Constructional 50 <1 Markedly impaired  Language 64 <1 Markedly impaired  Attention 60 <1 Markedly impaired  Delayed Memory N/A N/A N/A  Total Score N/A N/A N/A    RBANS Subtests Raw Score Percentile Description  List Learning 11 <1 Markedly impaired  Story Memory 4 <1 Markedly impaired  Figure Copy 0 <1 Markedly impaired  Line Orientation D/C <1 Markedly impaired  Picture Naming 6 <1 Markedly impaired  Semantic Fluency 14 <1 Markedly impaired  Digit Span 8 16 Below average  Coding N/A N/A N/A  List Recall 0 <1 Markedly impaired  List Recognition 15 <1 Markedly impaired  Story Recall 1 <1 Markedly impaired  Figure recall N/A N/A N/A    Geriatric Depression Scale (short form) Raw Score = 6/15 Description = Suggests at least mild depression   Cognitive Evaluation: Test results revealed markedly impaired functioning in all cognitive domains and thinking skills assessed during this evaluation with the exception of only mildly reduced simple attention.   Emotional & Behavioral Evaluation: Elizabeth [name] was appropriately dressed for season and situation, and she appeared tidy and well-groomed. Normal posture was noted. She was friendly and rapport adequately established. Her speech was as expected and she was able to express ideas effectively. She was noticeable bruised from her recent accident. She exhibited issues with visual neglect. She seemed to understand test directions readily. Her affect was somewhat flat. Attention and motivation were  adequate. Optimal test taking conditions were maintained.  From an emotional standpoint, on a brief self-report measure, Elizabeth [name] endorsed experiencing at least a mild degree of depressive symptoms. Suicidal/homicidal ideation, plan or intent was denied. No manic or hypomanic episodes were reported. No major behavioral or personality changes were endorsed.    Overall, the patient is seemingly suffering from a number of cognitive, motor and visual deficits subsequent to her complex history of multiple strokes, and her recent head injury. 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 Of course, these symptoms could be a manifestation of her complex medical history but consultation is recommended to assist in differential diagnosis and possible treatment. Lastly, depressive symptoms may also be adversely impacting the patient's daily life and cognition, but they are certainly not the driving force behind her symptoms.    In light of these findings, the following recommendations are provided and were discussed with Elizabeth [name].    RECOMMENDATIONS:   Follow-up with a dementia specialist is recommended for consultation and possible treatment. Consider treatment with cognitive enhancing and/or neuroprotective medication, if not medically contraindicated.    Since emotional factors seem to be impacting the patient's daily life, follow-up regarding her antidepressant is warranted. However, her providers may want to consider another class of antidepressant because Elavil (amitriptyline) can cause cognitive disruption.    Of note, she is reportedly taking this mediation for chronic pain, but since depression seems  to also be present, venlafaxine is a possible alternative that may combat both symptoms of depression and pain, though this can best be determined by her care providers.     Considering the noted medical history, the patient is at increased risk for progression of cognitive impairment. Therefore, it is recommended that she aggressively manage any modifiable risk factors for further cognitive decline such as strict compliance with prescribed medical treatments for any cerebrovascular risk factors.   Due to the nature and severity of the symptoms noted during this evaluation, it is recommended that Mrs. Alwine obtain 24-hour care and supervision, as the profound cognitive deficits noted represent a safety risk if she is left alone for extended periods of time.  It is also recommended that the severity of her impairments is considered when assessing the level of asset management required. For example, given impairment in higher-level reasoning and problem-solving skills, it is recommended that she should consult with a family member or another trusted advisor prior to making any important medical, legal, or financial decisions. Establishing or continuing to rely upon someone who has Power of Ecologist and medical decision-making is also warranted. Lastly, the patient should continue to refrain from driving.   Community resources are available regarding the care of adults with dementia. The Alzheimer's Association has a website that offers tips and recommendations to aide families in caring for family members with dementia (even for those without Alzheimer's disease). AreaCities.com.ee.asp. The following website for the [facility] may also be useful http://www.acecare.org/about-us, as well as ExShows.dk, a website that contains an extensive list of community based resources.   Mrs. Studzinski is encouraged to attend to lifestyle factors  for brain health (e.g., regular physical exercise, good nutrition habits, regular participation in cognitively-stimulating activities, and general stress management techniques), which are likely to have benefits  for both emotional adjustment and cognition.  In fact, in addition to promoting general good health, regular exercise incorporating aerobic activities (even just walking) has been demonstrated to be a very effective treatment for depression and stress, with similar efficacy rates to both antidepressant medication and psychotherapy. And for those with orthopedic issues, water aerobics may be particularly beneficial.  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  Clinical Neuropsychologist

## 2012-10-15 ENCOUNTER — Ambulatory Visit (INDEPENDENT_AMBULATORY_CARE_PROVIDER_SITE_OTHER): Payer: Medicare Other | Admitting: Cardiovascular Disease

## 2012-10-15 DIAGNOSIS — Z7901 Long term (current) use of anticoagulants: Secondary | ICD-10-CM

## 2012-10-15 DIAGNOSIS — I4891 Unspecified atrial fibrillation: Secondary | ICD-10-CM

## 2012-10-15 DIAGNOSIS — I639 Cerebral infarction, unspecified: Secondary | ICD-10-CM

## 2012-10-15 DIAGNOSIS — I6350 Cerebral infarction due to unspecified occlusion or stenosis of unspecified cerebral artery: Secondary | ICD-10-CM

## 2012-10-15 MED ORDER — WARFARIN SODIUM 2.5 MG PO TABS: 5.0000 mg | ORAL_TABLET | Freq: Every evening | ORAL | Status: DC

## 2012-10-20 ENCOUNTER — Ambulatory Visit: Payer: Self-pay | Admitting: Internal Medicine

## 2012-10-24 ENCOUNTER — Other Ambulatory Visit: Payer: Self-pay | Admitting: Dermatology

## 2012-10-29 ENCOUNTER — Ambulatory Visit (INDEPENDENT_AMBULATORY_CARE_PROVIDER_SITE_OTHER): Payer: Medicare Other | Admitting: Cardiology

## 2012-10-29 DIAGNOSIS — Z7901 Long term (current) use of anticoagulants: Secondary | ICD-10-CM

## 2012-10-29 DIAGNOSIS — I4891 Unspecified atrial fibrillation: Secondary | ICD-10-CM

## 2012-10-29 DIAGNOSIS — I639 Cerebral infarction, unspecified: Secondary | ICD-10-CM

## 2012-10-29 LAB — POCT INR: INR: 4.4

## 2012-10-30 ENCOUNTER — Encounter: Payer: Medicare Other | Admitting: Physical Medicine & Rehabilitation

## 2012-11-01 ENCOUNTER — Telehealth: Payer: Self-pay

## 2012-11-01 NOTE — Telephone Encounter (Signed)
Pt husband called and states pt is having "slight chest pains", would like pt to be seen today.

## 2012-11-01 NOTE — Telephone Encounter (Signed)
I spoke with the patient's husband. He reports she fell recently and got out of the hospital 3 weeks ago. She has had some increased difficulty walking. Today she has developed lower chest pain just below her right breast. The pain is non radiating with no other symptoms. This started about 10 am this morning and is slowly subsiding. He BP is ok today per the husbands report. They were requesting to be seen today. I have advised there are no visits open today. I have offered an appointment for tomorrow morning at 9:00 am with Dr. Mariah Milling. They are agreeable with this. I have also advised with her history of CABG, if her symptoms worsen, to please report to the nearest ER. The patient's husband verbalizes understanding.

## 2012-11-01 NOTE — Telephone Encounter (Signed)
[name] would like to extend therapy orders.  Verbal given.

## 2012-11-02 ENCOUNTER — Encounter: Payer: Self-pay | Admitting: Cardiovascular Disease

## 2012-11-02 ENCOUNTER — Ambulatory Visit (INDEPENDENT_AMBULATORY_CARE_PROVIDER_SITE_OTHER): Payer: Medicare Other | Admitting: Cardiovascular Disease

## 2012-11-02 VITALS — BP 112/52 | HR 59 | Ht 64.0 in | Wt 143.0 lb

## 2012-11-02 DIAGNOSIS — I4891 Unspecified atrial fibrillation: Secondary | ICD-10-CM

## 2012-11-02 DIAGNOSIS — I2510 Atherosclerotic heart disease of native coronary artery without angina pectoris: Secondary | ICD-10-CM

## 2012-11-02 DIAGNOSIS — I951 Orthostatic hypotension: Secondary | ICD-10-CM

## 2012-11-02 DIAGNOSIS — R079 Chest pain, unspecified: Secondary | ICD-10-CM

## 2012-11-02 DIAGNOSIS — K551 Chronic vascular disorders of intestine: Secondary | ICD-10-CM

## 2012-11-02 MED ORDER — NITROGLYCERIN 0.4 MG SL SUBL: 0.4000 mg | SUBLINGUAL_TABLET | SUBLINGUAL | Status: DC | PRN

## 2012-11-02 NOTE — Patient Instructions (Signed)
You are doing well. No medication changes were made.  For chest pain, we will check blood work today Please take nitroglycerin for chest pain, ok to cut it in 1/2  Please call us if you have new issues that need to be addressed before your next appt.  Your physician wants you to follow-up in: 6 months.  You will receive a reminder letter in the mail two months in advance. If you don't receive a letter, please call our office to schedule the follow-up appointment.

## 2012-11-02 NOTE — Assessment & Plan Note (Signed)
Blood pressure relatively stable with minimal orthostatic type symptoms on Florinef. Some large hemi-edema but mild and stable

## 2012-11-02 NOTE — Assessment & Plan Note (Signed)
Maintaining normal sinus rhythm 

## 2012-11-02 NOTE — Progress Notes (Signed)
Patient ID: Elizabeth [name], female    DOB: [birth year], 72 y.o.   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 Florinef was started earlier in 2012 with improvement of her symptoms and the ability to work with physical therapy.   history of aortic atherosclerosis, hyperlipidemia, labile blood pressures  She has vision problems, continues to have balance problems, and was told that she has vascular dementia.  She is taking Florinef 0.1 mg every other day with improvement of her dizziness. She is not doing her regular exercises. She has had more problems with her abdomen. She has waxing waning constipation and loose bowel movements .Diagnosis of ischemic bowel. She is seeing a doctor at [facility] for second opinion. Also diagnosed with yeast esophagitis, being treated with fluconazole.  Recent low blood glucose level, fall found at home after trauma to her face in a pool of blood. Sugar was 47. Husband found her after she had fallen. She is now doing better, walking with a walker. Legs are weak. Episode yesterday of severe leg weakness and inability to move. No seizure type activity. Also with chest pain that lasted more than one hour. Symptoms were on the right-hand side with some radiation to the middle. Husband wonders if it could be musculoskeletal   Echo showed EF 50-55% with  mild AI.     Carotid u/s 11/09. normal.      cholesterol medication was held last month. Lipid panel done one week ago shows total cholesterol 158, LDL 65, HDL 69, vitamin D 30, hematocrit 35, creatinine 0.85, magnesium 1.5  EKG shows normal Bradycardia with rate 59 beats per minute, no significant ST or T wave changes    Outpatient Encounter Prescriptions as of 11/02/2012  Medication Sig Dispense Refill  . amitriptyline (ELAVIL) 25 MG tablet Takes 1 tablet odd days and 1/2 tablet even days of month.      Marland Kitchen atorvastatin (LIPITOR) 10 MG tablet Take 1 tablet (10 mg total) by mouth every other day.  30 tablet  1  . Calcium Carbonate-Vitamin D (CALCIUM 600-D) 600-400 MG-UNIT per tablet Take 1 tablet by mouth daily.       . Cholecalciferol (VITAMIN D3) 400 UNITS CAPS Take 800 Units by mouth at bedtime.      . fludrocortisone (FLORINEF) 0.1 MG tablet Take 1 tablet (0.1 mg total) by mouth every other day.  30 tablet  1  . insulin glargine (LANTUS) 100 UNIT/ML injection Inject 12 Units into the skin at bedtime.      . insulin lispro (HUMALOG) 100 UNIT/ML injection Inject 2-9 Units into the skin 4 (four) times daily -  before meals and at bedtime. Per sliding scale.      . levothyroxine (SYNTHROID, LEVOTHROID) 150 MCG tablet Take 1 tablet (150 mcg total) by mouth See admin instructions. Take 1 tablet daily Monday through Friday  30 tablet  1  . levothyroxine (SYNTHROID, LEVOTHROID) 175 MCG tablet Take 175 mcg  by mouth See admin instructions. 1 tablet daily on Saturday and Sunday      . Magnesium 250 MG TABS Take 250 mg by mouth daily.      . Multiple Vitamin (MULTIVITAMIN WITH MINERALS) TABS Take 1 tablet by mouth daily.      . mycophenolate (MYFORTIC) 360 MG TBEC Take 360 mg by mouth 2 (two) times daily.       Marland Kitchen omeprazole (PRILOSEC) 20 MG capsule Take 20 mg by mouth every morning.       . predniSONE (DELTASONE) 5 MG tablet Take 5 mg by mouth every morning.       . tacrolimus (PROGRAF) 0.5 MG capsule  Take 1-2 mg by mouth 2 (two) times daily. Take 2mg  in the am & 1mg  at night      . warfarin (COUMADIN) 5 MG tablet Take 5 mg by mouth daily. Pt can only take coumadin brand name.      . [DISCONTINUED] insulin glargine (LANTUS) 100 UNIT/ML injection Inject 20 Units into the skin at bedtime.  10 mL  1  . [DISCONTINUED] insulin glargine (LANTUS) 100 UNIT/ML injection Inject 5 Units into the skin daily.  10 mL  1  . [DISCONTINUED] levothyroxine (SYNTHROID, LEVOTHROID) 150 MCG tablet Take 1 tablet (150 mcg total) by mouth daily.      . [DISCONTINUED] traMADol (ULTRAM) 50 MG tablet Take 0.5-1 tablets (25-50 mg total) by mouth every 6 (six) hours as needed (25mg  for moderate pain, 50mg  for severe pain).  60 tablet  0  . [DISCONTINUED] traZODone (DESYREL) 50 MG tablet Take 1 tablet (50 mg total) by mouth at bedtime.  30 tablet  0  . [DISCONTINUED] warfarin (COUMADIN) 2.5 MG tablet Take 2 tablets (5 mg total) by mouth every evening. Take as directed by anticoagulation clinic  180 tablet  1   No facility-administered encounter medications on file as of 11/02/2012.     Review of Systems  HENT: Negative.   Eyes: Negative.   Respiratory: Negative.   Cardiovascular: Positive for chest pain.  Musculoskeletal: Positive for gait problem.  Skin: Negative.   Neurological: Positive for weakness.  Psychiatric/Behavioral: Negative.   All other systems reviewed and are negative.    BP 112/52  Pulse 59  Ht 5\' 4"  (1.626 m)  Wt 143 lb (64.864 kg)  BMI 24.53 kg/m2  Physical Exam  Nursing note and vitals reviewed. Constitutional: She is oriented to person, place, and time. She appears well-developed and well-nourished.   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   HENT:  Head: Normocephalic.  Nose: Nose normal.  Mouth/Throat: Oropharynx is clear and moist.  Eyes: Conjunctivae are normal. Pupils are equal, round, and reactive to light.  Neck: Normal range of motion. Neck supple. No JVD present.   Cardiovascular: Normal rate, regular rhythm, S1 normal, S2 normal, normal heart sounds and intact distal pulses.  Exam reveals no gallop and no friction rub.   No murmur heard. Pulmonary/Chest: Effort normal and breath sounds normal. No respiratory distress. She has no wheezes. She has no rales. She exhibits no tenderness.  Abdominal: Soft. Bowel sounds are normal. She exhibits no distension. There is no tenderness.  Musculoskeletal: Normal range of motion. She exhibits no edema and no tenderness.  Lymphadenopathy:    She has no cervical adenopathy.  Neurological: She is alert and oriented to person, place, and time. Coordination normal.  Skin: Skin is warm and dry. No rash noted. No erythema.  Psychiatric: She has  a normal mood and affect. Her behavior is normal. Judgment and thought content normal.    Assessment and Plan

## 2012-11-02 NOTE — Assessment & Plan Note (Signed)
She's not complaining today of GI symptoms. known mesenteric disease

## 2012-11-02 NOTE — Assessment & Plan Note (Signed)
Chest pain  Yesterday. We willsend cardiac enzymes. Possibly musculoskeletal as she is now walking with a walker. We will give her nitroglycerin to take as needed. We have suggested that they call if she has additional episodes. Stress testing could be done

## 2012-11-05 LAB — TROPONIN I: Troponin I: 0.02 ng/mL (ref 0.00–0.04)

## 2012-11-07 ENCOUNTER — Ambulatory Visit (INDEPENDENT_AMBULATORY_CARE_PROVIDER_SITE_OTHER): Payer: Medicare Other | Admitting: Cardiovascular Disease

## 2012-11-13 LAB — COMPREHENSIVE METABOLIC PANEL
Albumin: 3.4 g/dL (ref 3.4–5.0)
Alkaline Phosphatase: 89 U/L (ref 50–136)
Anion Gap: 6 — ABNORMAL LOW (ref 7–16)
BUN: 20 mg/dL — ABNORMAL HIGH (ref 7–18)
Chloride: 101 mmol/L (ref 98–107)
EGFR (African American): >60
EGFR (Non-African Amer.): 59 — ABNORMAL LOW
Glucose: 138 mg/dL — ABNORMAL HIGH (ref 65–99)
Osmolality: 279 (ref 275–301)
SGOT(AST): 23 U/L (ref 15–37)
SGPT (ALT): 21 U/L (ref 12–78)

## 2012-11-13 LAB — CBC
HCT: 37.7 % (ref 35.0–47.0)
HGB: 12.2 g/dL (ref 12.0–16.0)
MCHC: 32.2 g/dL (ref 32.0–36.0)
MCV: 92 fL (ref 80–100)
Platelet: 176 10*3/uL (ref 150–440)
RBC: 4.12 10*6/uL (ref 3.80–5.20)
RDW: 15.9 % — ABNORMAL HIGH (ref 11.5–14.5)

## 2012-11-14 ENCOUNTER — Inpatient Hospital Stay: Payer: Self-pay | Admitting: Internal Medicine

## 2012-11-14 ENCOUNTER — Encounter: Payer: Self-pay | Admitting: Internal Medicine

## 2012-11-14 LAB — PROTIME-INR: INR: 2.6

## 2012-11-15 DIAGNOSIS — Z01810 Encounter for preprocedural cardiovascular examination: Secondary | ICD-10-CM

## 2012-11-15 DIAGNOSIS — I4891 Unspecified atrial fibrillation: Secondary | ICD-10-CM

## 2012-11-15 DIAGNOSIS — I2510 Atherosclerotic heart disease of native coronary artery without angina pectoris: Secondary | ICD-10-CM

## 2012-11-15 LAB — URINALYSIS, COMPLETE
Blood: NEGATIVE
Glucose,UR: NEGATIVE mg/dL (ref 0–75)
Hyaline Cast: 1
Ph: 6 (ref 4.5–8.0)
RBC,UR: 1 /HPF (ref 0–5)
Specific Gravity: 1.014 (ref 1.003–1.030)
Squamous Epithelial: NONE SEEN
WBC UR: 77 /HPF (ref 0–5)

## 2012-11-15 LAB — BASIC METABOLIC PANEL
Anion Gap: 2 — ABNORMAL LOW (ref 7–16)
Chloride: 106 mmol/L (ref 98–107)
Co2: 31 mmol/L (ref 21–32)
Creatinine: 0.9 mg/dL (ref 0.60–1.30)
EGFR (African American): >60
Glucose: 97 mg/dL (ref 65–99)
Osmolality: 279 (ref 275–301)
Sodium: 139 mmol/L (ref 136–145)

## 2012-11-16 LAB — PROTIME-INR: INR: 4.1

## 2012-11-17 LAB — CBC WITH DIFFERENTIAL/PLATELET
Basophil %: 0.3 %
Eosinophil %: 1.9 %
Lymphocyte #: 0.8 10*3/uL — ABNORMAL LOW (ref 1.0–3.6)
Lymphocyte %: 14.9 %
MCHC: 33.5 g/dL (ref 32.0–36.0)
MCV: 89 fL (ref 80–100)
Monocyte #: 0.6 x10 3/mm (ref 0.2–0.9)
Monocyte %: 10.9 %
Neutrophil #: 3.9 10*3/uL (ref 1.4–6.5)
Neutrophil %: 72 %
Platelet: 117 10*3/uL — ABNORMAL LOW (ref 150–440)
RDW: 15.1 % — ABNORMAL HIGH (ref 11.5–14.5)
WBC: 5.4 10*3/uL (ref 3.6–11.0)

## 2012-11-17 LAB — BASIC METABOLIC PANEL
BUN: 12 mg/dL (ref 7–18)
Calcium, Total: 8.7 mg/dL (ref 8.5–10.1)
Co2: 34 mmol/L — ABNORMAL HIGH (ref 21–32)
EGFR (African American): >60
EGFR (Non-African Amer.): >60
Glucose: 123 mg/dL — ABNORMAL HIGH (ref 65–99)
Potassium: 3.5 mmol/L (ref 3.5–5.1)
Sodium: 139 mmol/L (ref 136–145)

## 2012-11-18 LAB — PROTIME-INR: INR: 2.9

## 2012-11-18 LAB — URINE CULTURE

## 2012-11-19 LAB — CBC WITH DIFFERENTIAL/PLATELET
Basophil #: 0 10*3/uL (ref 0.0–0.1)
Eosinophil #: 0.1 10*3/uL (ref 0.0–0.7)
HCT: 31.5 % — ABNORMAL LOW (ref 35.0–47.0)
Lymphocyte #: 1.1 10*3/uL (ref 1.0–3.6)
MCHC: 34.4 g/dL (ref 32.0–36.0)
Monocyte %: 8.6 %
Neutrophil #: 4.2 10*3/uL (ref 1.4–6.5)
Neutrophil %: 71.8 %
Platelet: 131 10*3/uL — ABNORMAL LOW (ref 150–440)
WBC: 5.8 10*3/uL (ref 3.6–11.0)

## 2012-11-19 LAB — PROTIME-INR: INR: 1.7

## 2012-11-19 LAB — CREATININE, SERUM
Creatinine: 0.67 mg/dL (ref 0.60–1.30)
EGFR (African American): >60

## 2012-11-20 ENCOUNTER — Ambulatory Visit: Payer: Self-pay | Admitting: Internal Medicine

## 2012-11-20 LAB — PROTIME-INR: INR: 1.9

## 2012-11-21 LAB — PROTIME-INR
INR: 2.6
Prothrombin Time: 26.9 secs — ABNORMAL HIGH (ref 11.5–14.7)

## 2012-12-07 ENCOUNTER — Other Ambulatory Visit: Payer: Self-pay | Admitting: Family Medicine

## 2012-12-07 LAB — CBC WITH DIFFERENTIAL/PLATELET
Basophil #: 0 10*3/uL (ref 0.0–0.1)
Basophil %: 0.3 %
Eosinophil #: 0 10*3/uL (ref 0.0–0.7)
HCT: 31.6 % — ABNORMAL LOW (ref 35.0–47.0)
HGB: 10 g/dL — ABNORMAL LOW (ref 12.0–16.0)
Lymphocyte #: 0.7 10*3/uL — ABNORMAL LOW (ref 1.0–3.6)
Monocyte #: 0.9 x10 3/mm (ref 0.2–0.9)
Monocyte %: 7.6 %
Neutrophil #: 9.8 10*3/uL — ABNORMAL HIGH (ref 1.4–6.5)
Platelet: 232 10*3/uL (ref 150–440)
RBC: 3.58 10*6/uL — ABNORMAL LOW (ref 3.80–5.20)

## 2012-12-07 LAB — COMPREHENSIVE METABOLIC PANEL
Alkaline Phosphatase: 131 U/L (ref 50–136)
Calcium, Total: 8.4 mg/dL — ABNORMAL LOW (ref 8.5–10.1)
Co2: 33 mmol/L — ABNORMAL HIGH (ref 21–32)
EGFR (African American): >60
SGOT(AST): 14 U/L — ABNORMAL LOW (ref 15–37)
SGPT (ALT): 8 U/L — ABNORMAL LOW (ref 12–78)
Sodium: 136 mmol/L (ref 136–145)
Total Protein: 4.9 g/dL — ABNORMAL LOW (ref 6.4–8.2)

## 2012-12-07 LAB — URINALYSIS, COMPLETE
Bilirubin,UR: NEGATIVE
Glucose,UR: 150 mg/dL (ref 0–75)
Ph: 5 (ref 4.5–8.0)
Protein: 100
Specific Gravity: 1.015 (ref 1.003–1.030)
Squamous Epithelial: NONE SEEN
WBC UR: 3941 /HPF (ref 0–5)

## 2012-12-07 LAB — TSH: Thyroid Stimulating Horm: 8.12 u[IU]/mL — ABNORMAL HIGH

## 2012-12-11 LAB — URINE CULTURE

## 2012-12-20 ENCOUNTER — Ambulatory Visit: Payer: Self-pay | Admitting: Internal Medicine

## 2013-01-04 ENCOUNTER — Inpatient Hospital Stay: Payer: Self-pay | Admitting: Internal Medicine

## 2013-01-04 LAB — COMPREHENSIVE METABOLIC PANEL
BUN: 19 mg/dL — ABNORMAL HIGH (ref 7–18)
Chloride: 100 mmol/L (ref 98–107)
EGFR (African American): >60
EGFR (Non-African Amer.): >60
Glucose: 127 mg/dL — ABNORMAL HIGH (ref 65–99)
SGPT (ALT): 9 U/L — ABNORMAL LOW (ref 12–78)
Sodium: 135 mmol/L — ABNORMAL LOW (ref 136–145)

## 2013-01-04 LAB — CBC
HGB: 9.4 g/dL — ABNORMAL LOW (ref 12.0–16.0)
MCH: 28.6 pg (ref 26.0–34.0)
MCV: 85 fL (ref 80–100)
RBC: 3.29 10*6/uL — ABNORMAL LOW (ref 3.80–5.20)
RDW: 16.8 % — ABNORMAL HIGH (ref 11.5–14.5)

## 2013-01-04 LAB — URINALYSIS, COMPLETE
Bilirubin,UR: NEGATIVE
Glucose,UR: NEGATIVE mg/dL (ref 0–75)
Ketone: NEGATIVE
Nitrite: NEGATIVE
Protein: 100
RBC,UR: 145 /HPF (ref 0–5)
WBC UR: 7546 /HPF (ref 0–5)

## 2013-01-04 LAB — PROTIME-INR
INR: 2.7
Prothrombin Time: 28.1 secs — ABNORMAL HIGH (ref 11.5–14.7)

## 2013-01-05 LAB — CBC WITH DIFFERENTIAL/PLATELET
Eosinophil %: 0.8 %
HGB: 9.9 g/dL — ABNORMAL LOW (ref 12.0–16.0)
MCH: 30.1 pg (ref 26.0–34.0)
MCV: 87 fL (ref 80–100)
Monocyte %: 8 %
Neutrophil #: 2.5 10*3/uL (ref 1.4–6.5)
Platelet: 200 10*3/uL (ref 150–440)
RBC: 3.3 10*6/uL — ABNORMAL LOW (ref 3.80–5.20)
WBC: 4.1 10*3/uL (ref 3.6–11.0)

## 2013-01-05 LAB — BASIC METABOLIC PANEL
Anion Gap: 4 — ABNORMAL LOW (ref 7–16)
Calcium, Total: 9.2 mg/dL (ref 8.5–10.1)
Chloride: 101 mmol/L (ref 98–107)
Osmolality: 283 (ref 275–301)
Sodium: 137 mmol/L (ref 136–145)

## 2013-01-07 LAB — PROTIME-INR: Prothrombin Time: 26.7 secs — ABNORMAL HIGH (ref 11.5–14.7)

## 2013-01-07 LAB — TSH: Thyroid Stimulating Horm: >100 u[IU]/mL

## 2013-01-08 LAB — PROTIME-INR
INR: 2.8
Prothrombin Time: 28.7 secs — ABNORMAL HIGH (ref 11.5–14.7)

## 2013-01-09 LAB — HEMOGLOBIN: HGB: 8.9 g/dL — ABNORMAL LOW (ref 12.0–16.0)

## 2013-01-09 LAB — PROTIME-INR
INR: 3.2
Prothrombin Time: 31.9 secs — ABNORMAL HIGH (ref 11.5–14.7)

## 2013-01-10 ENCOUNTER — Telehealth (HOSPITAL_COMMUNITY): Payer: Self-pay | Admitting: Obstetrics and Gynecology

## 2013-01-10 LAB — BASIC METABOLIC PANEL
Anion Gap: 4 — ABNORMAL LOW (ref 7–16)
BUN: 11 mg/dL (ref 7–18)
Calcium, Total: 8.5 mg/dL (ref 8.5–10.1)
Chloride: 104 mmol/L (ref 98–107)
Creatinine: 0.76 mg/dL (ref 0.60–1.30)
EGFR (African American): >60
EGFR (Non-African Amer.): >60
Glucose: 78 mg/dL (ref 65–99)
Osmolality: 280 (ref 275–301)

## 2013-01-10 LAB — CBC WITH DIFFERENTIAL/PLATELET
Basophil #: 0 10*3/uL (ref 0.0–0.1)
Eosinophil %: 0.7 %
HCT: 27 % — ABNORMAL LOW (ref 35.0–47.0)
HGB: 9.2 g/dL — ABNORMAL LOW (ref 12.0–16.0)
Lymphocyte #: 1.3 10*3/uL (ref 1.0–3.6)
Lymphocyte %: 27.6 %
MCHC: 34 g/dL (ref 32.0–36.0)
Monocyte #: 0.4 x10 3/mm (ref 0.2–0.9)
Neutrophil %: 64.1 %
RDW: 17.5 % — ABNORMAL HIGH (ref 11.5–14.5)
WBC: 4.8 10*3/uL (ref 3.6–11.0)

## 2013-01-13 ENCOUNTER — Other Ambulatory Visit: Payer: Self-pay | Admitting: Family Medicine

## 2013-01-13 LAB — URINALYSIS, COMPLETE
Glucose,UR: NEGATIVE mg/dL (ref 0–75)
Nitrite: NEGATIVE
Ph: 8 (ref 4.5–8.0)
Protein: NEGATIVE
RBC,UR: 7 /HPF (ref 0–5)
Specific Gravity: 1.012 (ref 1.003–1.030)
Squamous Epithelial: <1

## 2013-01-20 ENCOUNTER — Ambulatory Visit: Payer: Self-pay | Admitting: Internal Medicine

## 2013-01-24 ENCOUNTER — Other Ambulatory Visit: Payer: Self-pay | Admitting: Family Medicine

## 2013-01-24 LAB — URINALYSIS, COMPLETE
Bilirubin,UR: NEGATIVE
Blood: NEGATIVE
Glucose,UR: NEGATIVE mg/dL (ref 0–75)
Protein: NEGATIVE
Specific Gravity: 1.018 (ref 1.003–1.030)
Squamous Epithelial: <1

## 2013-01-26 LAB — URINE CULTURE

## 2013-02-11 ENCOUNTER — Telehealth: Payer: Self-pay | Admitting: Internal Medicine

## 2013-02-11 NOTE — Telephone Encounter (Signed)
Abdominal pain and intermittent vomiting.  Will come in and see Dr. Leone Payor on 02/18/13

## 2013-02-14 ENCOUNTER — Telehealth: Payer: Self-pay

## 2013-02-14 NOTE — Telephone Encounter (Signed)
Called pt's husband regarding pt being overdue for Coumadin follow-up.  Pt's husband states pt fell 3 months ago and injured hip.  Has been in the hospital and in a skilled nursing facility x the past 3 months.  Pt is being discharged with home health tomorrow.  Will anticipate an INR within the next week.

## 2013-02-18 ENCOUNTER — Ambulatory Visit (INDEPENDENT_AMBULATORY_CARE_PROVIDER_SITE_OTHER): Payer: Medicare Other | Admitting: Internal Medicine

## 2013-02-18 ENCOUNTER — Encounter: Payer: Self-pay | Admitting: Internal Medicine

## 2013-02-18 VITALS — BP 100/62 | HR 52 | Ht 63.5 in | Wt 133.0 lb

## 2013-02-18 DIAGNOSIS — R109 Unspecified abdominal pain: Secondary | ICD-10-CM

## 2013-02-18 DIAGNOSIS — K5900 Constipation, unspecified: Secondary | ICD-10-CM

## 2013-02-18 DIAGNOSIS — K5909 Other constipation: Secondary | ICD-10-CM

## 2013-02-18 DIAGNOSIS — R1030 Lower abdominal pain, unspecified: Secondary | ICD-10-CM

## 2013-02-18 NOTE — Patient Instructions (Addendum)
Please use your Lidoderm patches you have already.  Apply one patch for 12 hours a day to your lower abdominal area for pain.  Continue your ducolax for constipation.   Call us back with an update in a week or so.  Phone # [phone] and leave message for Darcey Nora, [facility].   I appreciate the opportunity to care for you.

## 2013-02-18 NOTE — Progress Notes (Addendum)
  Subjective:    Patient ID: Elizabeth [name], female    DOB: 01/23/1941, 71 y.o.   MRN: [id number]  HPI The patient is here with her husband. 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 Her abdominal pain also improved. Unfortunately she developed hallucinations, that resolved with stopping the amitriptyline. This was retried at a lower dose but hallucinations recurred. In the interim she was also fallen, not clearly related to the amitriptyline would possibly, and had multiple facial fractures, and then she had a fall in an L4 compression fracture. She has been in a rehabilitation facility for about 3 months. I think she is home now this recently. For the past month she has had abdominal pain in the lower abdomen. She is also been constipated. She does have a chronic recurrent UTI situation as well as Bentyl multiple rounds of antibiotics. Her husband reports that Dr. Arrie Aran was going to send me his most recent note with what he thought was going on what might be a good way to treat this, but have not yet received that.  The pain is fairly constant she says, defecation does not seem to help it. There using Dulcolax to help with her bowel movements, she has been on narcotics as well more recently because of pain. Prior to all this she was moving her bowels better.  Medications, allergies, past medical history, past surgical history, family history and social history are reviewed and updated in the EMR.   Review of Systems As above.    Objective:   Physical Exam Chronically ill woman in no acute distress in wheelchair Abdomen is soft, she is mild to moderately tender in the suprapubic area, there is no hernia or mass effect, it does not really change with muscle tension.    Assessment & Plan:   1. Suprapubic abdominal pain, unspecified laterality   2. Constipation, chronic    Therapeutic and diagnostic options are limited. I wonder  if it might be a functional pain related to her constipation. Apparently she still has some mild odynophagia times though she certainly not complaining about that like she has no past. Her affect seems flat again.  I'm not sure what the cause of this pain is she's had a chronic recurrent lower abdominal pain. We're going to try a Lidoderm patch and she will use Dulcolax when necessary constipation. She has failed multiple other agents including MiraLax and Linzess.   Suppose Neurontin is one possible option but I would like to discuss that with Dr. Arrie Aran prior to starting that.  CC: COLADONATO,JOSEPH A, MD   Discussed with and reviewed note from Dr. Arrie Aran. He was wondering if she might have C diff colitis - very unlikely with constipation

## 2013-02-19 ENCOUNTER — Ambulatory Visit (INDEPENDENT_AMBULATORY_CARE_PROVIDER_SITE_OTHER): Payer: Medicare Other | Admitting: Internal Medicine

## 2013-02-19 DIAGNOSIS — I639 Cerebral infarction, unspecified: Secondary | ICD-10-CM

## 2013-02-19 DIAGNOSIS — I4891 Unspecified atrial fibrillation: Secondary | ICD-10-CM

## 2013-02-19 DIAGNOSIS — Z7901 Long term (current) use of anticoagulants: Secondary | ICD-10-CM

## 2013-02-19 DIAGNOSIS — I6350 Cerebral infarction due to unspecified occlusion or stenosis of unspecified cerebral artery: Secondary | ICD-10-CM

## 2013-02-19 LAB — POCT INR: INR: 2.7

## 2013-02-24 ENCOUNTER — Other Ambulatory Visit: Payer: Self-pay

## 2013-02-24 ENCOUNTER — Other Ambulatory Visit: Payer: Self-pay | Admitting: Family Medicine

## 2013-02-24 LAB — CBC WITH DIFFERENTIAL/PLATELET
Basophil %: 0.5 %
Eosinophil #: 0 10*3/uL (ref 0.0–0.7)
Eosinophil %: 0.6 %
HCT: 28.5 % — ABNORMAL LOW (ref 35.0–47.0)
Lymphocyte #: 1 10*3/uL (ref 1.0–3.6)
MCH: 30.4 pg (ref 26.0–34.0)
MCHC: 33.4 g/dL (ref 32.0–36.0)
MCV: 91 fL (ref 80–100)
Monocyte #: 0.4 x10 3/mm (ref 0.2–0.9)
Monocyte %: 7.3 %
Neutrophil %: 70.9 %
RBC: 3.14 10*6/uL — ABNORMAL LOW (ref 3.80–5.20)
RDW: 15.2 % — ABNORMAL HIGH (ref 11.5–14.5)
WBC: 4.8 10*3/uL (ref 3.6–11.0)

## 2013-02-24 LAB — COMPREHENSIVE METABOLIC PANEL
Albumin: 2.6 g/dL — ABNORMAL LOW (ref 3.4–5.0)
Alkaline Phosphatase: 73 U/L (ref 50–136)
Anion Gap: 6 — ABNORMAL LOW (ref 7–16)
BUN: 17 mg/dL (ref 7–18)
Bilirubin,Total: 0.3 mg/dL (ref 0.2–1.0)
Co2: 32 mmol/L (ref 21–32)
EGFR (African American): >60
EGFR (Non-African Amer.): 54 — ABNORMAL LOW
Glucose: 117 mg/dL — ABNORMAL HIGH (ref 65–99)
Potassium: 4.2 mmol/L (ref 3.5–5.1)
SGPT (ALT): 17 U/L (ref 12–78)
Sodium: 143 mmol/L (ref 136–145)
Total Protein: 5.1 g/dL — ABNORMAL LOW (ref 6.4–8.2)

## 2013-02-24 LAB — PROTIME-INR: INR: 2.3

## 2013-02-24 LAB — LIPID PANEL
Cholesterol: 132 mg/dL (ref 0–200)
Triglycerides: 93 mg/dL (ref 0–200)

## 2013-02-27 LAB — URINE CULTURE

## 2013-03-01 ENCOUNTER — Telehealth: Payer: Self-pay

## 2013-03-01 NOTE — Telephone Encounter (Signed)
Called spoke with Dennie Bible, [facility] RN she states RN went out to pt's home to check INR ordered for 02/26/13, but discovered pt had been admitted to [facility] s/p fall.

## 2013-03-02 ENCOUNTER — Other Ambulatory Visit: Payer: Self-pay | Admitting: Family Medicine

## 2013-03-06 ENCOUNTER — Other Ambulatory Visit: Payer: Self-pay | Admitting: Family Medicine

## 2013-03-06 LAB — CLOSTRIDIUM DIFFICILE BY PCR

## 2013-03-08 ENCOUNTER — Other Ambulatory Visit: Payer: Self-pay | Admitting: Family Medicine

## 2013-03-08 LAB — URINALYSIS, COMPLETE
Bilirubin,UR: NEGATIVE
Glucose,UR: 50 mg/dL (ref 0–75)
Nitrite: NEGATIVE
Ph: 7 (ref 4.5–8.0)
Protein: NEGATIVE
RBC,UR: 5 /HPF (ref 0–5)
Specific Gravity: 1.014 (ref 1.003–1.030)
Squamous Epithelial: NONE SEEN
WBC UR: 41 /HPF (ref 0–5)

## 2013-03-10 LAB — URINE CULTURE

## 2013-03-11 ENCOUNTER — Encounter: Payer: Self-pay | Admitting: Internal Medicine

## 2013-03-25 ENCOUNTER — Other Ambulatory Visit: Payer: Self-pay | Admitting: Family Medicine

## 2013-03-25 LAB — URINALYSIS, COMPLETE
Glucose,UR: >=500 mg/dL (ref 0–75)
Nitrite: NEGATIVE
Ph: 8 (ref 4.5–8.0)
Protein: 30
Specific Gravity: 1.015 (ref 1.003–1.030)
Squamous Epithelial: NONE SEEN

## 2013-04-13 ENCOUNTER — Other Ambulatory Visit: Payer: Self-pay | Admitting: Family Medicine

## 2013-04-13 LAB — URINALYSIS, COMPLETE
Bilirubin,UR: NEGATIVE
Glucose,UR: NEGATIVE mg/dL (ref 0–75)
Nitrite: NEGATIVE
Protein: NEGATIVE
RBC,UR: <1 /HPF (ref 0–5)
Specific Gravity: 1.005 (ref 1.003–1.030)
WBC UR: 9 /HPF (ref 0–5)

## 2013-04-15 ENCOUNTER — Other Ambulatory Visit: Payer: Self-pay | Admitting: Family Medicine

## 2013-04-15 LAB — CBC WITH DIFFERENTIAL/PLATELET
Basophil %: 0.3 %
Eosinophil %: 0.2 %
HCT: 33 % — ABNORMAL LOW (ref 35.0–47.0)
HGB: 11 g/dL — ABNORMAL LOW (ref 12.0–16.0)
Lymphocyte #: 0.8 10*3/uL — ABNORMAL LOW (ref 1.0–3.6)
MCH: 29.6 pg (ref 26.0–34.0)
MCV: 89 fL (ref 80–100)
Neutrophil %: 81.8 %
Platelet: 151 10*3/uL (ref 150–440)
RBC: 3.72 10*6/uL — ABNORMAL LOW (ref 3.80–5.20)
RDW: 15.9 % — ABNORMAL HIGH (ref 11.5–14.5)
WBC: 8.9 10*3/uL (ref 3.6–11.0)

## 2013-04-15 LAB — COMPREHENSIVE METABOLIC PANEL
Albumin: 2.9 g/dL — ABNORMAL LOW (ref 3.4–5.0)
Alkaline Phosphatase: 77 U/L (ref 50–136)
Anion Gap: 4 — ABNORMAL LOW (ref 7–16)
Bilirubin,Total: 0.6 mg/dL (ref 0.2–1.0)
Calcium, Total: 12.7 mg/dL — ABNORMAL HIGH (ref 8.5–10.1)
Chloride: 98 mmol/L (ref 98–107)
Creatinine: 1.22 mg/dL (ref 0.60–1.30)
EGFR (African American): 51 — ABNORMAL LOW
EGFR (Non-African Amer.): 44 — ABNORMAL LOW
Glucose: 231 mg/dL — ABNORMAL HIGH (ref 65–99)
Potassium: 3.5 mmol/L (ref 3.5–5.1)
SGOT(AST): 15 U/L (ref 15–37)
SGPT (ALT): 18 U/L (ref 12–78)
Sodium: 144 mmol/L (ref 136–145)
Total Protein: 5.6 g/dL — ABNORMAL LOW (ref 6.4–8.2)

## 2013-04-17 LAB — URINE CULTURE

## 2013-04-21 ENCOUNTER — Other Ambulatory Visit: Payer: Self-pay | Admitting: Family Medicine

## 2013-04-21 LAB — URINALYSIS, COMPLETE
Bilirubin,UR: NEGATIVE
Blood: NEGATIVE
Glucose,UR: NEGATIVE mg/dL (ref 0–75)
Ketone: NEGATIVE
Nitrite: NEGATIVE
Ph: 8 (ref 4.5–8.0)
Protein: 30
RBC,UR: 3 /HPF (ref 0–5)
Specific Gravity: 1.009 (ref 1.003–1.030)
Squamous Epithelial: NONE SEEN

## 2013-04-21 LAB — CBC WITH DIFFERENTIAL/PLATELET
Basophil #: 0 10*3/uL (ref 0.0–0.1)
Basophil %: 0.3 %
Eosinophil %: 1.4 %
HCT: 29.5 % — ABNORMAL LOW (ref 35.0–47.0)
HGB: 10.1 g/dL — ABNORMAL LOW (ref 12.0–16.0)
Lymphocyte %: 23.1 %
MCH: 29.8 pg (ref 26.0–34.0)
Monocyte %: 8.2 %
Neutrophil %: 67 %
RBC: 3.38 10*6/uL — ABNORMAL LOW (ref 3.80–5.20)
RDW: 15.4 % — ABNORMAL HIGH (ref 11.5–14.5)
WBC: 6 10*3/uL (ref 3.6–11.0)

## 2013-04-21 LAB — COMPREHENSIVE METABOLIC PANEL
Albumin: 2.3 g/dL — ABNORMAL LOW (ref 3.4–5.0)
Alkaline Phosphatase: 60 U/L (ref 50–136)
BUN: 17 mg/dL (ref 7–18)
Bilirubin,Total: 0.5 mg/dL (ref 0.2–1.0)
Calcium, Total: 9.8 mg/dL (ref 8.5–10.1)
Chloride: 103 mmol/L (ref 98–107)
Co2: 36 mmol/L — ABNORMAL HIGH (ref 21–32)
EGFR (African American): >60
EGFR (Non-African Amer.): >60
Potassium: 3.1 mmol/L — ABNORMAL LOW (ref 3.5–5.1)
SGOT(AST): 14 U/L — ABNORMAL LOW (ref 15–37)
SGPT (ALT): 13 U/L (ref 12–78)
Total Protein: 4.7 g/dL — ABNORMAL LOW (ref 6.4–8.2)

## 2013-04-21 LAB — LIPID PANEL
Cholesterol: 123 mg/dL (ref 0–200)
HDL Cholesterol: 57 mg/dL (ref 40–60)
Ldl Cholesterol, Calc: 45 mg/dL (ref 0–100)
Triglycerides: 104 mg/dL (ref 0–200)
VLDL Cholesterol, Calc: 21 mg/dL (ref 5–40)

## 2013-04-21 LAB — HEMOGLOBIN A1C: Hemoglobin A1C: 7.4 % — ABNORMAL HIGH (ref 4.2–6.3)

## 2013-04-23 LAB — URINE CULTURE

## 2013-05-13 DIAGNOSIS — N3020 Other chronic cystitis without hematuria: Secondary | ICD-10-CM | POA: Insufficient documentation

## 2013-05-15 ENCOUNTER — Other Ambulatory Visit: Payer: Self-pay | Admitting: Family Medicine

## 2013-05-15 LAB — CBC WITH DIFFERENTIAL/PLATELET
Basophil #: 0 10*3/uL (ref 0.0–0.1)
Basophil %: 0.4 %
Eosinophil #: 0 10*3/uL (ref 0.0–0.7)
HCT: 25.4 % — ABNORMAL LOW (ref 35.0–47.0)
Lymphocyte #: 1 10*3/uL (ref 1.0–3.6)
Lymphocyte %: 14.6 %
MCH: 30.7 pg (ref 26.0–34.0)
MCHC: 33.8 g/dL (ref 32.0–36.0)
MCV: 91 fL (ref 80–100)
Monocyte #: 0.6 x10 3/mm (ref 0.2–0.9)
Monocyte %: 8.6 %
Neutrophil #: 5.3 10*3/uL (ref 1.4–6.5)
Neutrophil %: 76.4 %
RBC: 2.8 10*6/uL — ABNORMAL LOW (ref 3.80–5.20)
RDW: 16.3 % — ABNORMAL HIGH (ref 11.5–14.5)
WBC: 7 10*3/uL (ref 3.6–11.0)

## 2013-05-15 LAB — COMPREHENSIVE METABOLIC PANEL
Albumin: 2.5 g/dL — ABNORMAL LOW (ref 3.4–5.0)
Alkaline Phosphatase: 62 U/L (ref 50–136)
BUN: 26 mg/dL — ABNORMAL HIGH (ref 7–18)
Chloride: 104 mmol/L (ref 98–107)
Creatinine: 1.79 mg/dL — ABNORMAL HIGH (ref 0.60–1.30)
EGFR (African American): 32 — ABNORMAL LOW
Glucose: 177 mg/dL — ABNORMAL HIGH (ref 65–99)
SGOT(AST): 16 U/L (ref 15–37)
SGPT (ALT): 12 U/L (ref 12–78)

## 2013-05-15 LAB — URINALYSIS, COMPLETE
Bilirubin,UR: NEGATIVE
Hyaline Cast: 8
Nitrite: NEGATIVE
Ph: 9 (ref 4.5–8.0)
Protein: 30
Specific Gravity: 1.011 (ref 1.003–1.030)

## 2013-05-17 ENCOUNTER — Other Ambulatory Visit: Payer: Self-pay | Admitting: Family Medicine

## 2013-05-17 LAB — BASIC METABOLIC PANEL
Anion Gap: 9 (ref 7–16)
BUN: 26 mg/dL — ABNORMAL HIGH (ref 7–18)
Chloride: 103 mmol/L (ref 98–107)
Creatinine: 1.23 mg/dL (ref 0.60–1.30)
EGFR (African American): 50 — ABNORMAL LOW
Osmolality: 282 (ref 275–301)
Sodium: 138 mmol/L (ref 136–145)

## 2013-05-17 LAB — URINALYSIS, COMPLETE
Bilirubin,UR: NEGATIVE
Protein: >=500
RBC,UR: 179 /HPF (ref 0–5)
Squamous Epithelial: NONE SEEN
WBC UR: 4056 /HPF (ref 0–5)

## 2013-05-17 LAB — CBC WITH DIFFERENTIAL/PLATELET
Eosinophil #: 0 10*3/uL (ref 0.0–0.7)
HCT: 26.7 % — ABNORMAL LOW (ref 35.0–47.0)
Lymphocyte #: 1.1 10*3/uL (ref 1.0–3.6)
MCHC: 34.8 g/dL (ref 32.0–36.0)
MCV: 87 fL (ref 80–100)
Neutrophil %: 80.5 %
Platelet: 216 10*3/uL (ref 150–440)
RBC: 3.06 10*6/uL — ABNORMAL LOW (ref 3.80–5.20)
RDW: 16.5 % — ABNORMAL HIGH (ref 11.5–14.5)
WBC: 7.4 10*3/uL (ref 3.6–11.0)

## 2013-05-20 ENCOUNTER — Other Ambulatory Visit: Payer: Self-pay | Admitting: Family Medicine

## 2013-05-20 LAB — COMPREHENSIVE METABOLIC PANEL
Anion Gap: 7 (ref 7–16)
Co2: 28 mmol/L (ref 21–32)
EGFR (Non-African Amer.): 51 — ABNORMAL LOW
Osmolality: 286 (ref 275–301)
Potassium: 3.5 mmol/L (ref 3.5–5.1)
SGPT (ALT): 11 U/L — ABNORMAL LOW (ref 12–78)
Sodium: 141 mmol/L (ref 136–145)

## 2013-05-20 LAB — CBC WITH DIFFERENTIAL/PLATELET
Basophil %: 0.4 %
HCT: 28.3 % — ABNORMAL LOW (ref 35.0–47.0)
HGB: 9.6 g/dL — ABNORMAL LOW (ref 12.0–16.0)
Lymphocyte #: 0.8 10*3/uL — ABNORMAL LOW (ref 1.0–3.6)
Lymphocyte %: 19.2 %
MCHC: 34.1 g/dL (ref 32.0–36.0)
MCV: 88 fL (ref 80–100)
Monocyte %: 5.6 %
Neutrophil #: 3 10*3/uL (ref 1.4–6.5)
Neutrophil %: 74.5 %

## 2013-05-20 LAB — PROTIME-INR: INR: 2.4

## 2013-05-20 LAB — IRON AND TIBC
Iron Bind.Cap.(Total): 144 ug/dL — ABNORMAL LOW (ref 250–450)
Unbound Iron-Bind.Cap.: 81 ug/dL

## 2013-05-20 LAB — FERRITIN: Ferritin (ARMC): 560 ng/mL — ABNORMAL HIGH (ref 8–388)

## 2013-05-24 ENCOUNTER — Other Ambulatory Visit: Payer: Self-pay | Admitting: Family Medicine

## 2013-05-24 LAB — CBC WITH DIFFERENTIAL/PLATELET
Basophil #: 0 10*3/uL (ref 0.0–0.1)
Basophil %: 0.4 %
Eosinophil #: 0 10*3/uL (ref 0.0–0.7)
HCT: 24 % — ABNORMAL LOW (ref 35.0–47.0)
HGB: 8.1 g/dL — ABNORMAL LOW (ref 12.0–16.0)
Lymphocyte #: 0.7 10*3/uL — ABNORMAL LOW (ref 1.0–3.6)
Lymphocyte %: 16.1 %
MCH: 30 pg (ref 26.0–34.0)
MCV: 89 fL (ref 80–100)
Monocyte #: 0.2 x10 3/mm (ref 0.2–0.9)
Neutrophil %: 79.2 %
RDW: 16.3 % — ABNORMAL HIGH (ref 11.5–14.5)
WBC: 4.4 10*3/uL (ref 3.6–11.0)

## 2013-05-24 LAB — BASIC METABOLIC PANEL
Anion Gap: 8 (ref 7–16)
BUN: 12 mg/dL (ref 7–18)
Chloride: 105 mmol/L (ref 98–107)
Co2: 26 mmol/L (ref 21–32)
Osmolality: 284 (ref 275–301)
Potassium: 3.2 mmol/L — ABNORMAL LOW (ref 3.5–5.1)
Sodium: 139 mmol/L (ref 136–145)

## 2013-05-28 ENCOUNTER — Other Ambulatory Visit: Payer: Self-pay | Admitting: Vascular Surgery

## 2013-05-28 LAB — URINALYSIS, COMPLETE
Bilirubin,UR: NEGATIVE
Nitrite: NEGATIVE
RBC,UR: 162 /HPF (ref 0–5)
Specific Gravity: 1.009 (ref 1.003–1.030)
Squamous Epithelial: 5
WBC UR: 5505 /HPF (ref 0–5)

## 2013-05-30 LAB — URINE CULTURE

## 2013-06-18 ENCOUNTER — Other Ambulatory Visit: Payer: Self-pay | Admitting: Family Medicine

## 2013-06-18 LAB — URINALYSIS, COMPLETE
Blood: NEGATIVE
Glucose,UR: 150 mg/dL (ref 0–75)
Ketone: NEGATIVE
Nitrite: NEGATIVE
Ph: 7 (ref 4.5–8.0)
RBC,UR: 5 /HPF (ref 0–5)
Specific Gravity: 1.011 (ref 1.003–1.030)
Squamous Epithelial: 11
WBC UR: 83 /HPF (ref 0–5)

## 2013-06-19 LAB — URINE CULTURE

## 2013-06-20 ENCOUNTER — Other Ambulatory Visit: Payer: Self-pay | Admitting: Family Medicine

## 2013-06-20 LAB — URINALYSIS, COMPLETE
Bilirubin,UR: NEGATIVE
Blood: NEGATIVE
Glucose,UR: NEGATIVE mg/dL (ref 0–75)
Nitrite: NEGATIVE
Ph: 6 (ref 4.5–8.0)
Protein: NEGATIVE
RBC,UR: 3 /HPF (ref 0–5)

## 2013-06-27 ENCOUNTER — Other Ambulatory Visit: Payer: Self-pay

## 2013-07-11 ENCOUNTER — Other Ambulatory Visit: Payer: Self-pay | Admitting: Family Medicine

## 2013-07-11 LAB — URINALYSIS, COMPLETE
Bilirubin,UR: NEGATIVE
RBC,UR: 28 /HPF (ref 0–5)
Specific Gravity: 1.015 (ref 1.003–1.030)
Squamous Epithelial: NONE SEEN
WBC UR: 218 /HPF (ref 0–5)

## 2013-07-16 ENCOUNTER — Other Ambulatory Visit: Payer: Self-pay | Admitting: Family Medicine

## 2013-07-16 LAB — PROTIME-INR
INR: 1.4
Prothrombin Time: 17.6 secs — ABNORMAL HIGH (ref 11.5–14.7)

## 2013-07-30 ENCOUNTER — Other Ambulatory Visit: Payer: Self-pay | Admitting: Family Medicine

## 2013-07-30 LAB — URINALYSIS, COMPLETE
Bilirubin,UR: NEGATIVE
Glucose,UR: NEGATIVE mg/dL (ref 0–75)
Hyaline Cast: 2
Ketone: NEGATIVE
Nitrite: NEGATIVE
Ph: 9 (ref 4.5–8.0)
Protein: 100
RBC,UR: 33 /HPF (ref 0–5)
Specific Gravity: 1.013 (ref 1.003–1.030)
WBC UR: 49 /HPF (ref 0–5)

## 2013-08-13 ENCOUNTER — Telehealth: Payer: Self-pay

## 2013-08-13 ENCOUNTER — Ambulatory Visit (INDEPENDENT_AMBULATORY_CARE_PROVIDER_SITE_OTHER): Payer: Medicare Other | Admitting: Cardiovascular Disease

## 2013-08-13 DIAGNOSIS — I6350 Cerebral infarction due to unspecified occlusion or stenosis of unspecified cerebral artery: Secondary | ICD-10-CM

## 2013-08-13 DIAGNOSIS — I639 Cerebral infarction, unspecified: Secondary | ICD-10-CM

## 2013-08-13 DIAGNOSIS — I4891 Unspecified atrial fibrillation: Secondary | ICD-10-CM

## 2013-08-13 DIAGNOSIS — Z7901 Long term (current) use of anticoagulants: Secondary | ICD-10-CM

## 2013-08-13 NOTE — Telephone Encounter (Signed)
See Anti sheet.

## 2013-08-13 NOTE — Telephone Encounter (Signed)
Calling PT/INR results PT 15.5, INR 1.3. Pt is currently taking 3.5 mg Coumadin per day. Please call with dosage change and when to recheck.

## 2013-08-20 ENCOUNTER — Ambulatory Visit (INDEPENDENT_AMBULATORY_CARE_PROVIDER_SITE_OTHER): Payer: Medicare Other | Admitting: Pharmacist

## 2013-08-20 DIAGNOSIS — I82629 Acute embolism and thrombosis of deep veins of unspecified upper extremity: Secondary | ICD-10-CM

## 2013-08-20 DIAGNOSIS — I6350 Cerebral infarction due to unspecified occlusion or stenosis of unspecified cerebral artery: Secondary | ICD-10-CM

## 2013-08-20 DIAGNOSIS — I639 Cerebral infarction, unspecified: Secondary | ICD-10-CM

## 2013-08-20 DIAGNOSIS — Z7901 Long term (current) use of anticoagulants: Secondary | ICD-10-CM

## 2013-08-20 DIAGNOSIS — I4891 Unspecified atrial fibrillation: Secondary | ICD-10-CM

## 2013-08-21 ENCOUNTER — Telehealth: Payer: Self-pay

## 2013-08-21 NOTE — Telephone Encounter (Signed)
Would like order home health aide. Please call.

## 2013-08-21 NOTE — Telephone Encounter (Signed)
Spoke w/ Velna Hatchet, RN.  Gave verbal order to continue home health aide for INR checks in pt's home. Asked her to inform pt that she needs to schedule a f/u w/ Dr. Mariah Milling, as we have not seen her since 3/14.

## 2013-09-03 ENCOUNTER — Ambulatory Visit (INDEPENDENT_AMBULATORY_CARE_PROVIDER_SITE_OTHER): Payer: Medicare Other | Admitting: Cardiology

## 2013-09-03 DIAGNOSIS — I4891 Unspecified atrial fibrillation: Secondary | ICD-10-CM

## 2013-09-03 DIAGNOSIS — I6350 Cerebral infarction due to unspecified occlusion or stenosis of unspecified cerebral artery: Secondary | ICD-10-CM

## 2013-09-03 DIAGNOSIS — I639 Cerebral infarction, unspecified: Secondary | ICD-10-CM

## 2013-09-03 DIAGNOSIS — Z7901 Long term (current) use of anticoagulants: Secondary | ICD-10-CM

## 2013-09-03 DIAGNOSIS — I82629 Acute embolism and thrombosis of deep veins of unspecified upper extremity: Secondary | ICD-10-CM

## 2013-09-03 LAB — POCT INR: INR: 1.7

## 2013-09-10 ENCOUNTER — Ambulatory Visit (INDEPENDENT_AMBULATORY_CARE_PROVIDER_SITE_OTHER): Payer: Medicare Other | Admitting: Pharmacist

## 2013-09-10 DIAGNOSIS — I6350 Cerebral infarction due to unspecified occlusion or stenosis of unspecified cerebral artery: Secondary | ICD-10-CM

## 2013-09-10 DIAGNOSIS — I82629 Acute embolism and thrombosis of deep veins of unspecified upper extremity: Secondary | ICD-10-CM

## 2013-09-10 DIAGNOSIS — Z7901 Long term (current) use of anticoagulants: Secondary | ICD-10-CM

## 2013-09-10 DIAGNOSIS — I4891 Unspecified atrial fibrillation: Secondary | ICD-10-CM

## 2013-09-10 DIAGNOSIS — I639 Cerebral infarction, unspecified: Secondary | ICD-10-CM

## 2013-09-10 LAB — POCT INR: INR: 1.6

## 2013-09-17 ENCOUNTER — Ambulatory Visit (INDEPENDENT_AMBULATORY_CARE_PROVIDER_SITE_OTHER): Payer: Private Health Insurance - Indemnity | Admitting: Cardiology

## 2013-09-17 DIAGNOSIS — I82629 Acute embolism and thrombosis of deep veins of unspecified upper extremity: Secondary | ICD-10-CM

## 2013-09-17 DIAGNOSIS — I6350 Cerebral infarction due to unspecified occlusion or stenosis of unspecified cerebral artery: Secondary | ICD-10-CM

## 2013-09-17 DIAGNOSIS — Z5181 Encounter for therapeutic drug level monitoring: Secondary | ICD-10-CM | POA: Insufficient documentation

## 2013-09-17 DIAGNOSIS — I4891 Unspecified atrial fibrillation: Secondary | ICD-10-CM

## 2013-09-17 DIAGNOSIS — I639 Cerebral infarction, unspecified: Secondary | ICD-10-CM

## 2013-09-17 DIAGNOSIS — Z7901 Long term (current) use of anticoagulants: Secondary | ICD-10-CM

## 2013-09-17 LAB — POCT INR: INR: 1.9

## 2013-09-25 ENCOUNTER — Ambulatory Visit (INDEPENDENT_AMBULATORY_CARE_PROVIDER_SITE_OTHER): Payer: Medicare Other | Admitting: Cardiovascular Disease

## 2013-09-25 ENCOUNTER — Telehealth: Payer: Self-pay | Admitting: *Deleted

## 2013-09-25 DIAGNOSIS — I639 Cerebral infarction, unspecified: Secondary | ICD-10-CM

## 2013-09-25 DIAGNOSIS — I82629 Acute embolism and thrombosis of deep veins of unspecified upper extremity: Secondary | ICD-10-CM

## 2013-09-25 DIAGNOSIS — I6350 Cerebral infarction due to unspecified occlusion or stenosis of unspecified cerebral artery: Secondary | ICD-10-CM

## 2013-09-25 DIAGNOSIS — I4891 Unspecified atrial fibrillation: Secondary | ICD-10-CM

## 2013-09-25 DIAGNOSIS — Z5181 Encounter for therapeutic drug level monitoring: Secondary | ICD-10-CM

## 2013-09-25 LAB — POCT INR: INR: 2.6

## 2013-09-25 NOTE — Telephone Encounter (Signed)
Vinnie Level with Home health called in INR 2.6 Please call

## 2013-09-25 NOTE — Telephone Encounter (Signed)
Returned call to [facility] Nurse, see Anti-coag encounter.

## 2013-10-02 ENCOUNTER — Ambulatory Visit (INDEPENDENT_AMBULATORY_CARE_PROVIDER_SITE_OTHER): Payer: Medicare Other | Admitting: Pharmacist

## 2013-10-02 DIAGNOSIS — I639 Cerebral infarction, unspecified: Secondary | ICD-10-CM

## 2013-10-02 DIAGNOSIS — I6350 Cerebral infarction due to unspecified occlusion or stenosis of unspecified cerebral artery: Secondary | ICD-10-CM

## 2013-10-02 DIAGNOSIS — Z5181 Encounter for therapeutic drug level monitoring: Secondary | ICD-10-CM

## 2013-10-02 DIAGNOSIS — I4891 Unspecified atrial fibrillation: Secondary | ICD-10-CM

## 2013-10-02 DIAGNOSIS — I82629 Acute embolism and thrombosis of deep veins of unspecified upper extremity: Secondary | ICD-10-CM

## 2013-10-02 LAB — POCT INR: INR: 2.6

## 2013-10-03 ENCOUNTER — Ambulatory Visit
Admission: RE | Admit: 2013-10-03 | Discharge: 2013-10-03 | Disposition: A | Discharge status: Home / Self Care | Payer: Medicare Other | Source: Ambulatory Visit | Attending: Nephrology | Admitting: Nephrology

## 2013-10-03 ENCOUNTER — Other Ambulatory Visit: Payer: Self-pay | Admitting: Nephrology

## 2013-10-03 DIAGNOSIS — R05 Cough: Secondary | ICD-10-CM

## 2013-10-03 DIAGNOSIS — R0602 Shortness of breath: Secondary | ICD-10-CM

## 2013-10-03 DIAGNOSIS — R059 Cough, unspecified: Secondary | ICD-10-CM

## 2013-10-09 ENCOUNTER — Ambulatory Visit (INDEPENDENT_AMBULATORY_CARE_PROVIDER_SITE_OTHER): Payer: Medicare Other | Admitting: Pharmacist

## 2013-10-09 DIAGNOSIS — I4891 Unspecified atrial fibrillation: Secondary | ICD-10-CM

## 2013-10-09 DIAGNOSIS — I639 Cerebral infarction, unspecified: Secondary | ICD-10-CM

## 2013-10-09 DIAGNOSIS — Z5181 Encounter for therapeutic drug level monitoring: Secondary | ICD-10-CM

## 2013-10-09 DIAGNOSIS — I6350 Cerebral infarction due to unspecified occlusion or stenosis of unspecified cerebral artery: Secondary | ICD-10-CM

## 2013-10-09 DIAGNOSIS — I82629 Acute embolism and thrombosis of deep veins of unspecified upper extremity: Secondary | ICD-10-CM

## 2013-10-09 LAB — POCT INR: INR: 3.7

## 2013-10-14 ENCOUNTER — Ambulatory Visit (INDEPENDENT_AMBULATORY_CARE_PROVIDER_SITE_OTHER): Payer: Medicare Other | Admitting: Cardiology

## 2013-10-14 DIAGNOSIS — I639 Cerebral infarction, unspecified: Secondary | ICD-10-CM

## 2013-10-14 DIAGNOSIS — I4891 Unspecified atrial fibrillation: Secondary | ICD-10-CM

## 2013-10-14 DIAGNOSIS — I82629 Acute embolism and thrombosis of deep veins of unspecified upper extremity: Secondary | ICD-10-CM

## 2013-10-14 DIAGNOSIS — Z5181 Encounter for therapeutic drug level monitoring: Secondary | ICD-10-CM

## 2013-10-14 DIAGNOSIS — I6350 Cerebral infarction due to unspecified occlusion or stenosis of unspecified cerebral artery: Secondary | ICD-10-CM

## 2013-10-14 LAB — POCT INR: INR: 3.1

## 2013-10-17 ENCOUNTER — Ambulatory Visit: Payer: Medicare Other | Admitting: Cardiovascular Disease

## 2013-10-21 ENCOUNTER — Ambulatory Visit (INDEPENDENT_AMBULATORY_CARE_PROVIDER_SITE_OTHER): Payer: Medicare Other | Admitting: Internal Medicine

## 2013-10-21 ENCOUNTER — Telehealth: Payer: Self-pay

## 2013-10-21 ENCOUNTER — Encounter: Payer: Self-pay | Admitting: Cardiovascular Disease

## 2013-10-21 ENCOUNTER — Ambulatory Visit (INDEPENDENT_AMBULATORY_CARE_PROVIDER_SITE_OTHER): Payer: Medicare Other | Admitting: Cardiovascular Disease

## 2013-10-21 VITALS — BP 100/64 | HR 58 | Ht 64.0 in | Wt 170.0 lb

## 2013-10-21 DIAGNOSIS — Z940 Kidney transplant status: Secondary | ICD-10-CM

## 2013-10-21 DIAGNOSIS — I509 Heart failure, unspecified: Secondary | ICD-10-CM

## 2013-10-21 DIAGNOSIS — I82629 Acute embolism and thrombosis of deep veins of unspecified upper extremity: Secondary | ICD-10-CM

## 2013-10-21 DIAGNOSIS — R609 Edema, unspecified: Secondary | ICD-10-CM

## 2013-10-21 DIAGNOSIS — I639 Cerebral infarction, unspecified: Secondary | ICD-10-CM

## 2013-10-21 DIAGNOSIS — I6350 Cerebral infarction due to unspecified occlusion or stenosis of unspecified cerebral artery: Secondary | ICD-10-CM

## 2013-10-21 DIAGNOSIS — R079 Chest pain, unspecified: Secondary | ICD-10-CM

## 2013-10-21 DIAGNOSIS — Z5181 Encounter for therapeutic drug level monitoring: Secondary | ICD-10-CM

## 2013-10-21 DIAGNOSIS — I2510 Atherosclerotic heart disease of native coronary artery without angina pectoris: Secondary | ICD-10-CM

## 2013-10-21 DIAGNOSIS — I5031 Acute diastolic (congestive) heart failure: Secondary | ICD-10-CM

## 2013-10-21 DIAGNOSIS — I5032 Chronic diastolic (congestive) heart failure: Secondary | ICD-10-CM | POA: Insufficient documentation

## 2013-10-21 DIAGNOSIS — I4891 Unspecified atrial fibrillation: Secondary | ICD-10-CM

## 2013-10-21 DIAGNOSIS — R600 Localized edema: Secondary | ICD-10-CM

## 2013-10-21 DIAGNOSIS — R0602 Shortness of breath: Secondary | ICD-10-CM

## 2013-10-21 LAB — POCT INR: INR: 3.9

## 2013-10-21 MED ORDER — TORSEMIDE 20 MG PO TABS: 40.0000 mg | ORAL_TABLET | Freq: Two times a day (BID) | ORAL | Status: DC

## 2013-10-21 NOTE — Patient Instructions (Signed)
You are doing well. Please hold the lasix, Start torsemide 40 mg in the AM (ok to try torsemide 20 mg in the Am to start)  If no significant urination/weight loss in a few days, Please call the office We would perform an echo  Please call us if you have new issues that need to be addressed before your next appt.  Your physician wants you to follow-up in: 3 weeks

## 2013-10-21 NOTE — Assessment & Plan Note (Signed)
Etiology of her presentation is unclear though concerning for fluid retention while on high-dose Florinef. Uncertain if she was drinking more at home December 2014 when she got home from the nursing home. Lasix is obviously not working as her clinical presentation today he is concerning for worsening heart failure. We have suggested she hold the Lasix, start torsemide 20 mg daily with week advancement to 40 mg, even 40 mg twice a day for diuresis. If no reasonable diuresis in the next several days, she may require hospitalization. I suggested to her husband that he call her office in the next several days

## 2013-10-21 NOTE — Assessment & Plan Note (Signed)
Husband reports having recently increased from 1.1 up to 1.6 on Lasix. Despite this she has had worsening symptoms of fluid overload now with nausea likely from bowel wall edema. We will change her diuretic to torsemide

## 2013-10-21 NOTE — Assessment & Plan Note (Signed)
Unable to exclude cardiac pathology as a source of her fluid overload. Echocardiogram has been ordered.

## 2013-10-21 NOTE — Assessment & Plan Note (Signed)
Edema is worse consistent with fluid overload. No adequate diuresis on Lasix. We'll change to torsemide

## 2013-10-21 NOTE — Telephone Encounter (Signed)
Spoke w/ pt's husband.  He reports that pt has been in a SNF since December. He reports that pt's wt is up considerably (reports 50lb wt gain since Dec), kidney fx is good, according to her nephrologist. Reports she was recently diagnosed w/ pleural effusions. She is continuing to gain wt and it is difficult for her to move around and is having sob, which is worse at night.  Spoke w/ Dr. Rockey Situ, added pt on to this pm at 3:30.

## 2013-10-21 NOTE — Progress Notes (Signed)
Patient ID: Elizabeth [name], female    DOB: [birth year], 73 y.o.   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 Florinef was started earlier in 2012 with improvement of her symptoms and the ability to work with physical therapy.  history of aortic atherosclerosis, hyperlipidemia, labile blood pressures  She has vision problems, continues to have balance problems, and was told that she has vascular dementia. Previous diagnosis of yeast esophagitis, being treated with fluconazole.  Her last visit was one year ago At that time she was taking Florinef 0.1 mg every other day with improvement of her dizziness.  In followup today, her husband reports that she spent most of the year at a nursing home, [facility]. There she had periods of dehydration, anorexia. Weight dropped down to 120 pounds. She did make it home at the end of 2014. Weight quickly increase to 120 pounds up to 153 pounds. At that point at the end of January 2015, her Florinef dose was decreased and eventually stopped. Most recent weight 170 pounds at home for approximately 50 pound weight gain in the past several months. She was started on low-dose Lasix 5 alternating with 10 mg every other day, now is taking 40 mg alternating with 20 mg.  Chest x-ray shows bilateral pleural effusions. She reports severe swelling  of her right leg, abdominal swelling, anorexia, nausea and is carrying a bucket with her today, also has shortness of breath cough when she lays flat.   Previous Echo several years ago showed EF 50-55% with mild AI.     Carotid u/s 11/09. normal.     EKG shows normal Bradycardia with rate 58 beats per minute, no significant ST or T wave changes , low-voltage diffusely   Outpatient Encounter Prescriptions as of 10/21/2013  Medication Sig  . atorvastatin (LIPITOR) 10 MG tablet Take 1 tablet (10 mg total) by mouth every other day.  . Cholecalciferol (VITAMIN D3) 400 UNITS CAPS Take 2,000 Units by mouth at bedtime.   . docusate sodium (COLACE) 100 MG capsule Take 100 mg by mouth daily.  Marland Kitchen escitalopram (LEXAPRO) 10 MG tablet Take 10 mg by mouth daily.  . ferrous fumarate (HEMOCYTE - 106 MG FE) 325 (106 FE) MG TABS tablet Take 1 tablet by mouth 2 (two) times daily.  . furosemide (LASIX) 20 MG tablet Take 20 mg twice day on odd day with 10 mg twice a day on even days.  Marland Kitchen HYDROcodone-acetaminophen (NORCO/VICODIN) 5-325 MG per tablet Take 2 tablets by mouth 2 (two) times daily as needed for pain (2 at night  qhs and more if needed).  . hyoscyamine (ANASPAZ) 0.125 MG TBDP disintergrating tablet Place 0.125 mg under the tongue as needed.  . insulin glargine (LANTUS) 100 UNIT/ML injection Inject 6 Units into the skin at bedtime.   . insulin lispro (HUMALOG) 100 UNIT/ML injection Inject 2-9 Units into the skin 4 (four) times daily -  before meals and at  bedtime. Per sliding scale.  . levothyroxine (SYNTHROID, LEVOTHROID) 175 MCG tablet Take 175 mcg by mouth daily. 1 tablet daily on Saturday and Sunday  . Magnesium 250 MG TABS Take 1,600 mg by mouth daily.   . mirtazapine (REMERON) 7.5 MG tablet Take 7.5 mg by mouth at bedtime.   . Multiple Vitamin (MULTIVITAMIN WITH MINERALS) TABS Take 1 tablet by mouth daily.  . mycophenolate (MYFORTIC) 360 MG TBEC Take 360 mg by mouth 2 (two) times daily.   .  nitroGLYCERIN (NITROSTAT) 0.4 MG SL tablet Place 1 tablet (0.4 mg total) under the tongue every 5 (five) minutes as needed for chest pain.  Marland Kitchen omeprazole (PRILOSEC) 20 MG capsule Take 20 mg by mouth every morning.   . ondansetron (ZOFRAN) 8 MG tablet Take by mouth every 8 (eight) hours as needed for nausea or vomiting.  . potassium chloride (K-DUR) 10 MEQ tablet Take 10 mEq by mouth daily.  . predniSONE (DELTASONE) 5 MG tablet Take 5 mg by mouth every morning.   . promethazine (PHENERGAN) 12.5 MG tablet Take 12.5 mg by mouth as needed.   . tacrolimus (PROGRAF) 0.5 MG capsule Take 1-2 mg by mouth 2 (two) times daily. Take 2mg  in the am & 1mg  at night  . tamsulosin (FLOMAX) 0.4 MG CAPS capsule Take 0.4 mg by mouth daily.  Marland Kitchen warfarin (COUMADIN) 5 MG tablet Take 2.5 mg by mouth daily. Pt can only take coumadin brand name.    Review of Systems  Constitutional: Positive for unexpected weight change.  HENT: Positive for facial swelling.   Eyes: Negative.   Respiratory: Negative.   Cardiovascular: Positive for leg swelling.  Gastrointestinal: Positive for nausea and abdominal distention.  Endocrine: Negative.   Musculoskeletal: Positive for gait problem.  Skin: Negative.   Allergic/Immunologic: Negative.   Neurological: Positive for weakness.  Hematological: Negative.   Psychiatric/Behavioral: Negative.   All other systems reviewed and are negative.    BP 100/64  Pulse 58  Ht 5\' 4"  (1.626 m)  Wt 170 lb (77.111 kg)  BMI 29.17 kg/m2  Physical Exam  Nursing note and vitals reviewed. Constitutional: She is oriented to person, place, and time. She appears well-developed and well-nourished.   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   HENT:  Head: Normocephalic.  Nose: Nose normal.  Mouth/Throat: Oropharynx is clear and moist.  Eyes: Conjunctivae are normal. Pupils are equal, round, and reactive to light.  Neck: Normal range of motion. Neck supple. No JVD present.   Cardiovascular: Normal rate, regular rhythm, S1 normal, S2 normal, normal heart sounds and intact distal pulses.  Exam reveals no gallop and no friction rub.   No murmur heard. 2+ pitting edema of the right lower extremity  Pulmonary/Chest: Effort normal and breath sounds normal. No respiratory distress. She has no wheezes. She has no rales. She exhibits no tenderness.  Dull at the bases bilaterally  Abdominal: Soft. Bowel sounds are normal. She exhibits no distension. There is no tenderness.  Musculoskeletal: Normal range of motion. She exhibits no edema and no tenderness.  Lymphadenopathy:    She has no cervical adenopathy.  Neurological: She is alert and oriented to person, place, and time. Coordination normal.  Skin: Skin is warm and dry. No rash noted. No erythema.  Psychiatric: She has a normal mood and affect. Her behavior is normal. Judgment and thought content normal.    Assessment and Plan

## 2013-10-21 NOTE — Telephone Encounter (Signed)
States pt symptoms are getting much worse, swelling is getting worse, SOB, would like to talk with a nurse. Please call.

## 2013-10-24 ENCOUNTER — Encounter: Payer: Self-pay | Admitting: Cardiovascular Disease

## 2013-10-24 ENCOUNTER — Other Ambulatory Visit: Payer: Self-pay | Admitting: Cardiovascular Disease

## 2013-10-24 ENCOUNTER — Ambulatory Visit: Payer: Medicare Other | Admitting: Cardiovascular Disease

## 2013-10-24 DIAGNOSIS — I5033 Acute on chronic diastolic (congestive) heart failure: Secondary | ICD-10-CM

## 2013-10-24 DIAGNOSIS — Z940 Kidney transplant status: Secondary | ICD-10-CM

## 2013-10-28 ENCOUNTER — Ambulatory Visit (INDEPENDENT_AMBULATORY_CARE_PROVIDER_SITE_OTHER): Payer: Medicare Other | Admitting: Pharmacist

## 2013-10-28 DIAGNOSIS — I82629 Acute embolism and thrombosis of deep veins of unspecified upper extremity: Secondary | ICD-10-CM

## 2013-10-28 DIAGNOSIS — I6350 Cerebral infarction due to unspecified occlusion or stenosis of unspecified cerebral artery: Secondary | ICD-10-CM

## 2013-10-28 DIAGNOSIS — Z5181 Encounter for therapeutic drug level monitoring: Secondary | ICD-10-CM

## 2013-10-28 DIAGNOSIS — I4891 Unspecified atrial fibrillation: Secondary | ICD-10-CM

## 2013-10-28 DIAGNOSIS — I639 Cerebral infarction, unspecified: Secondary | ICD-10-CM

## 2013-10-28 LAB — POCT INR: INR: 2.7

## 2013-11-04 ENCOUNTER — Ambulatory Visit (INDEPENDENT_AMBULATORY_CARE_PROVIDER_SITE_OTHER): Payer: Medicare Other | Admitting: Cardiology

## 2013-11-04 DIAGNOSIS — I639 Cerebral infarction, unspecified: Secondary | ICD-10-CM

## 2013-11-04 DIAGNOSIS — I6350 Cerebral infarction due to unspecified occlusion or stenosis of unspecified cerebral artery: Secondary | ICD-10-CM

## 2013-11-04 DIAGNOSIS — I4891 Unspecified atrial fibrillation: Secondary | ICD-10-CM

## 2013-11-04 DIAGNOSIS — I82629 Acute embolism and thrombosis of deep veins of unspecified upper extremity: Secondary | ICD-10-CM

## 2013-11-04 DIAGNOSIS — Z5181 Encounter for therapeutic drug level monitoring: Secondary | ICD-10-CM

## 2013-11-04 LAB — POCT INR: INR: 1.9

## 2013-11-08 ENCOUNTER — Ambulatory Visit (INDEPENDENT_AMBULATORY_CARE_PROVIDER_SITE_OTHER): Payer: Medicare Other

## 2013-11-08 ENCOUNTER — Other Ambulatory Visit: Payer: Self-pay

## 2013-11-08 DIAGNOSIS — R001 Bradycardia, unspecified: Secondary | ICD-10-CM

## 2013-11-08 DIAGNOSIS — I2510 Atherosclerotic heart disease of native coronary artery without angina pectoris: Secondary | ICD-10-CM

## 2013-11-08 DIAGNOSIS — Z940 Kidney transplant status: Secondary | ICD-10-CM

## 2013-11-08 DIAGNOSIS — I509 Heart failure, unspecified: Secondary | ICD-10-CM

## 2013-11-08 DIAGNOSIS — I10 Essential (primary) hypertension: Secondary | ICD-10-CM

## 2013-11-08 DIAGNOSIS — I4891 Unspecified atrial fibrillation: Secondary | ICD-10-CM

## 2013-11-08 DIAGNOSIS — I639 Cerebral infarction, unspecified: Secondary | ICD-10-CM

## 2013-11-08 DIAGNOSIS — I5033 Acute on chronic diastolic (congestive) heart failure: Secondary | ICD-10-CM

## 2013-11-09 LAB — BASIC METABOLIC PANEL
BUN / CREAT RATIO: 23 (ref 11–26)
BUN: 27 mg/dL (ref 8–27)
CO2: 35 mmol/L — ABNORMAL HIGH (ref 18–29)
Calcium: 9.5 mg/dL (ref 8.7–10.3)
Chloride: 97 mmol/L (ref 97–108)
Creatinine, Ser: 1.17 mg/dL — ABNORMAL HIGH (ref 0.57–1.00)
GFR calc non Af Amer: 46 mL/min/{1.73_m2} — ABNORMAL LOW (ref >59)
GFR, EST AFRICAN AMERICAN: 53 mL/min/{1.73_m2} — AB (ref >59)
Glucose: 172 mg/dL — ABNORMAL HIGH (ref 65–99)
Potassium: 4.2 mmol/L (ref 3.5–5.2)
SODIUM: 145 mmol/L — AB (ref 134–144)

## 2013-11-11 ENCOUNTER — Ambulatory Visit (INDEPENDENT_AMBULATORY_CARE_PROVIDER_SITE_OTHER): Payer: Medicare Other | Admitting: Cardiology

## 2013-11-11 DIAGNOSIS — I639 Cerebral infarction, unspecified: Secondary | ICD-10-CM

## 2013-11-11 DIAGNOSIS — I6350 Cerebral infarction due to unspecified occlusion or stenosis of unspecified cerebral artery: Secondary | ICD-10-CM

## 2013-11-11 DIAGNOSIS — I82629 Acute embolism and thrombosis of deep veins of unspecified upper extremity: Secondary | ICD-10-CM

## 2013-11-11 DIAGNOSIS — Z5181 Encounter for therapeutic drug level monitoring: Secondary | ICD-10-CM

## 2013-11-11 DIAGNOSIS — I4891 Unspecified atrial fibrillation: Secondary | ICD-10-CM

## 2013-11-11 LAB — POCT INR: INR: 2

## 2013-11-13 ENCOUNTER — Ambulatory Visit (INDEPENDENT_AMBULATORY_CARE_PROVIDER_SITE_OTHER): Payer: Medicare Other | Admitting: Cardiovascular Disease

## 2013-11-13 ENCOUNTER — Encounter: Payer: Self-pay | Admitting: Cardiovascular Disease

## 2013-11-13 VITALS — BP 110/80 | HR 49 | Ht 63.5 in | Wt 171.0 lb

## 2013-11-13 DIAGNOSIS — R609 Edema, unspecified: Secondary | ICD-10-CM

## 2013-11-13 DIAGNOSIS — E785 Hyperlipidemia, unspecified: Secondary | ICD-10-CM

## 2013-11-13 DIAGNOSIS — I5031 Acute diastolic (congestive) heart failure: Secondary | ICD-10-CM

## 2013-11-13 DIAGNOSIS — I509 Heart failure, unspecified: Secondary | ICD-10-CM

## 2013-11-13 DIAGNOSIS — I2510 Atherosclerotic heart disease of native coronary artery without angina pectoris: Secondary | ICD-10-CM

## 2013-11-13 DIAGNOSIS — E119 Type 2 diabetes mellitus without complications: Secondary | ICD-10-CM

## 2013-11-13 DIAGNOSIS — I951 Orthostatic hypotension: Secondary | ICD-10-CM

## 2013-11-13 NOTE — Assessment & Plan Note (Signed)
Blood pressure stable no longer on Florinef. Recommended she watch her blood pressure

## 2013-11-13 NOTE — Assessment & Plan Note (Signed)
Currently with no symptoms of angina. No further workup at this time. Continue current medication regimen. 

## 2013-11-13 NOTE — Patient Instructions (Addendum)
You are doing well. No medication changes were made. Please take torsemide 20 mg as needed for weight above 136 pounds up to 140 pounds  Please reduce potassium down to every other day Also take potassium with diarrhea  Please call us if you have new issues that need to be addressed before your next appt.  Your physician wants you to follow-up in: 6 months.  You will receive a reminder letter in the mail two months in advance. If you don't receive a letter, please call our office to schedule the follow-up appointment.

## 2013-11-13 NOTE — Progress Notes (Signed)
Patient ID: Elizabeth [name], female    DOB: [birth year], 73 y.o.   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 Florinef was started earlier in 2012 with improvement of her symptoms and the ability to work with physical therapy.  history of aortic atherosclerosis, hyperlipidemia, labile blood pressures  She has vision problems, continues to have balance problems, and was told that she has vascular dementia. Previous diagnosis of yeast esophagitis, being treated with fluconazole.  On her last clinic visit, weight was up to 170 pounds, she had massive fluid retention, leg edema. She was started on torsemide 40 mg daily and has had significant weight loss of approximately 40 pounds in the past 3 weeks. Recent lab work 5 days ago showed creatinine 1.1 with normal potassium. She has not taken torsemide in the past 5 days and weight is stable at 132 pounds at home. Overall she feels much better, close to her baseline. Nausea has resolved. She denies any orthostatic type symptoms, in fact blood pressure ranging between 169 and 678 systolic   previous Chest x-ray shows bilateral pleural effusions.   Previous Echo several years ago showed EF 50-55% with mild AI.     Carotid u/s 11/09. normal.      Outpatient Encounter Prescriptions as of 11/13/2013  Medication Sig  . atorvastatin  (LIPITOR) 10 MG tablet Take 1 tablet (10 mg total) by mouth every other day.  . Cholecalciferol (VITAMIN D3) 400 UNITS CAPS Take 2,000 Units by mouth at bedtime.   . docusate sodium (COLACE) 100 MG capsule Take 100 mg by mouth daily.  Marland Kitchen escitalopram (LEXAPRO) 10 MG tablet Take 10 mg by mouth daily.  . ferrous fumarate (HEMOCYTE - 106 MG FE) 325 (106 FE) MG TABS tablet Take 1 tablet by mouth 2 (two) times daily.  Marland Kitchen HYDROcodone-acetaminophen (NORCO/VICODIN) 5-325 MG per tablet Take 2 tablets by mouth 2 (two) times daily as needed for pain (2 at night  qhs and more if needed).  . hyoscyamine (ANASPAZ) 0.125 MG TBDP disintergrating tablet Place 0.125 mg under the tongue as needed.  . insulin glargine (LANTUS) 100 UNIT/ML injection Inject 6 Units into the skin at bedtime.   . insulin lispro (HUMALOG) 100 UNIT/ML injection Inject 2-9 Units into the skin 4 (four) times daily -  before meals and at bedtime. Per sliding scale.  . levothyroxine (SYNTHROID, LEVOTHROID) 175 MCG tablet Take 175 mcg by mouth daily. 1 tablet daily on Saturday and Sunday  . Magnesium 250 MG TABS Take 1,600 mg by mouth daily.   . mirtazapine (REMERON) 7.5 MG tablet Take 7.5 mg by mouth at bedtime.   . Multiple Vitamin (MULTIVITAMIN WITH MINERALS) TABS Take 1 tablet by mouth daily.  . mycophenolate (MYFORTIC) 360 MG TBEC Take 360 mg by mouth 2 (two) times daily.   . nitroGLYCERIN (NITROSTAT) 0.4 MG SL tablet Place 1 tablet (0.4 mg total) under the tongue every 5 (five) minutes as needed for  chest pain.  Marland Kitchen omeprazole (PRILOSEC) 20 MG capsule Take 20 mg by mouth every morning.   . ondansetron (ZOFRAN) 8 MG tablet Take by mouth every 8 (eight) hours as needed for nausea or vomiting.  . potassium chloride (K-DUR) 10 MEQ tablet Take 10 mEq by mouth daily.  . predniSONE (DELTASONE) 5 MG tablet Take 5 mg by mouth every morning.   . promethazine (PHENERGAN) 12.5 MG tablet Take 12.5 mg by mouth as needed.   . tacrolimus (PROGRAF) 0.5 MG  capsule Take 1-2 mg by mouth 2 (two) times daily. Take 2mg  in the am & 1mg  at night  . tamsulosin (FLOMAX) 0.4 MG CAPS capsule Take 0.4 mg by mouth daily.  Marland Kitchen torsemide (DEMADEX) 20 MG tablet Take 2 tablets (40 mg total) by mouth 2 (two) times daily.  Marland Kitchen warfarin (COUMADIN) 5 MG tablet Take 2.5 mg by mouth daily. Pt can only take coumadin brand name.     Review of Systems  Constitutional: Negative.   HENT: Negative.   Eyes: Negative.   Respiratory: Negative.   Cardiovascular: Negative.   Endocrine: Negative.   Musculoskeletal: Positive for gait problem.  Skin: Negative.   Allergic/Immunologic: Negative.   Neurological: Negative.   Hematological: Negative.   Psychiatric/Behavioral: Negative.   All other systems reviewed and are negative.    BP 110/80  Pulse 49  Ht 5' 3.5" (1.613 m)  Wt 171 lb (77.565 kg)  BMI 29.81 kg/m2  Physical Exam  Nursing note and vitals reviewed. Constitutional: She is oriented to person, place, and time. She appears well-developed and well-nourished.   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   HENT:  Head: Normocephalic.  Nose: Nose normal.  Mouth/Throat: Oropharynx is clear and moist.  Eyes: Conjunctivae are normal. Pupils are equal, round, and reactive to light.  Neck: Normal range of motion. Neck supple. No JVD present.  Cardiovascular: Normal rate, regular rhythm, S1 normal, S2 normal, normal heart sounds and intact distal pulses.  Exam reveals no gallop and no friction rub.   No murmur heard. Trace edema of the right lower extremity  Pulmonary/Chest: Effort normal and breath sounds normal. No respiratory distress. She has no wheezes. She has no rales. She exhibits no tenderness.  Minimal dullness at the right base   Abdominal: Soft. Bowel sounds are normal. She exhibits no distension. There is no tenderness.  Musculoskeletal: Normal range of motion. She exhibits no edema and no tenderness.  Lymphadenopathy:    She has no cervical  adenopathy.  Neurological: She is alert and oriented to person, place, and time. Coordination normal.  Skin: Skin is warm and dry. No rash noted. No erythema.  Psychiatric: She has a normal mood and affect. Her behavior is normal. Judgment and thought content normal.    Assessment and Plan

## 2013-11-13 NOTE — Assessment & Plan Note (Signed)
Dramatic improvement in her condition with torsemide 40 mg daily, then down to 20 mg every other day, now taking torsemide only as needed. We have recommended she take torsemide 20 mg when necessary for weight greater than 136 pounds up to 140 pounds. Normal potassium. Given that her diarrhea and diuretic dosing has decreased, she can take potassium every other day

## 2013-11-13 NOTE — Assessment & Plan Note (Signed)
Cholesterol is at goal on the current lipid regimen. No changes to the medications were made.  

## 2013-11-13 NOTE — Assessment & Plan Note (Signed)
Leg edema has essentially resolved, 40 pound weight loss over the past several weeks. We'll use torsemide sparingly for weight gain

## 2013-11-13 NOTE — Assessment & Plan Note (Signed)
Diabetes numbers appear to be relatively well-controlled. She has not been eating much recently in the setting of acute diastolic CHF

## 2013-11-18 ENCOUNTER — Telehealth: Payer: Self-pay

## 2013-11-18 ENCOUNTER — Ambulatory Visit (INDEPENDENT_AMBULATORY_CARE_PROVIDER_SITE_OTHER): Payer: Medicare Other | Admitting: Interventional Cardiology

## 2013-11-18 DIAGNOSIS — I6350 Cerebral infarction due to unspecified occlusion or stenosis of unspecified cerebral artery: Secondary | ICD-10-CM

## 2013-11-18 DIAGNOSIS — I639 Cerebral infarction, unspecified: Secondary | ICD-10-CM

## 2013-11-18 DIAGNOSIS — I82629 Acute embolism and thrombosis of deep veins of unspecified upper extremity: Secondary | ICD-10-CM

## 2013-11-18 DIAGNOSIS — Z5181 Encounter for therapeutic drug level monitoring: Secondary | ICD-10-CM

## 2013-11-18 DIAGNOSIS — I4891 Unspecified atrial fibrillation: Secondary | ICD-10-CM

## 2013-11-18 LAB — POCT INR: INR: 1.8

## 2013-11-18 NOTE — Telephone Encounter (Signed)
Nira Conn, RN w/ [facility] called w/ pt's INR: 1.8. Please advise.  Thank you.

## 2013-11-18 NOTE — Telephone Encounter (Signed)
Result noted see anticoag note in Epic. 

## 2013-11-19 ENCOUNTER — Emergency Department (HOSPITAL_COMMUNITY): Payer: Medicare Other

## 2013-11-19 ENCOUNTER — Inpatient Hospital Stay (HOSPITAL_COMMUNITY)
Admission: EM | Admit: 2013-11-19 | Discharge: 2013-11-23 | DRG: 070 | Disposition: A | Discharge status: Home / Self Care | Payer: Medicare Other | Attending: Family Medicine | Admitting: Family Medicine

## 2013-11-19 ENCOUNTER — Encounter (HOSPITAL_COMMUNITY): Payer: Self-pay | Admitting: Emergency Medicine

## 2013-11-19 DIAGNOSIS — E1129 Type 2 diabetes mellitus with other diabetic kidney complication: Secondary | ICD-10-CM | POA: Diagnosis present

## 2013-11-19 DIAGNOSIS — G9340 Encephalopathy, unspecified: Secondary | ICD-10-CM | POA: Diagnosis present

## 2013-11-19 DIAGNOSIS — Z9849 Cataract extraction status, unspecified eye: Secondary | ICD-10-CM

## 2013-11-19 DIAGNOSIS — R531 Weakness: Secondary | ICD-10-CM

## 2013-11-19 DIAGNOSIS — N390 Urinary tract infection, site not specified: Secondary | ICD-10-CM

## 2013-11-19 DIAGNOSIS — K589 Irritable bowel syndrome without diarrhea: Secondary | ICD-10-CM | POA: Diagnosis present

## 2013-11-19 DIAGNOSIS — I495 Sick sinus syndrome: Secondary | ICD-10-CM

## 2013-11-19 DIAGNOSIS — K219 Gastro-esophageal reflux disease without esophagitis: Secondary | ICD-10-CM | POA: Diagnosis present

## 2013-11-19 DIAGNOSIS — I6529 Occlusion and stenosis of unspecified carotid artery: Secondary | ICD-10-CM | POA: Diagnosis present

## 2013-11-19 DIAGNOSIS — I2510 Atherosclerotic heart disease of native coronary artery without angina pectoris: Secondary | ICD-10-CM | POA: Diagnosis present

## 2013-11-19 DIAGNOSIS — M6281 Muscle weakness (generalized): Secondary | ICD-10-CM

## 2013-11-19 DIAGNOSIS — I5031 Acute diastolic (congestive) heart failure: Secondary | ICD-10-CM

## 2013-11-19 DIAGNOSIS — S88119A Complete traumatic amputation at level between knee and ankle, unspecified lower leg, initial encounter: Secondary | ICD-10-CM

## 2013-11-19 DIAGNOSIS — I509 Heart failure, unspecified: Secondary | ICD-10-CM | POA: Diagnosis present

## 2013-11-19 DIAGNOSIS — I10 Essential (primary) hypertension: Secondary | ICD-10-CM

## 2013-11-19 DIAGNOSIS — Z86718 Personal history of other venous thrombosis and embolism: Secondary | ICD-10-CM

## 2013-11-19 DIAGNOSIS — I129 Hypertensive chronic kidney disease with stage 1 through stage 4 chronic kidney disease, or unspecified chronic kidney disease: Secondary | ICD-10-CM | POA: Diagnosis present

## 2013-11-19 DIAGNOSIS — E785 Hyperlipidemia, unspecified: Secondary | ICD-10-CM | POA: Diagnosis present

## 2013-11-19 DIAGNOSIS — Z85820 Personal history of malignant melanoma of skin: Secondary | ICD-10-CM

## 2013-11-19 DIAGNOSIS — E11319 Type 2 diabetes mellitus with unspecified diabetic retinopathy without macular edema: Secondary | ICD-10-CM | POA: Diagnosis present

## 2013-11-19 DIAGNOSIS — E039 Hypothyroidism, unspecified: Secondary | ICD-10-CM | POA: Diagnosis present

## 2013-11-19 DIAGNOSIS — I498 Other specified cardiac arrhythmias: Secondary | ICD-10-CM | POA: Diagnosis present

## 2013-11-19 DIAGNOSIS — L8993 Pressure ulcer of unspecified site, stage 3: Secondary | ICD-10-CM | POA: Diagnosis present

## 2013-11-19 DIAGNOSIS — R0902 Hypoxemia: Secondary | ICD-10-CM | POA: Diagnosis present

## 2013-11-19 DIAGNOSIS — N058 Unspecified nephritic syndrome with other morphologic changes: Secondary | ICD-10-CM | POA: Diagnosis present

## 2013-11-19 DIAGNOSIS — L89609 Pressure ulcer of unspecified heel, unspecified stage: Secondary | ICD-10-CM | POA: Diagnosis present

## 2013-11-19 DIAGNOSIS — B952 Enterococcus as the cause of diseases classified elsewhere: Secondary | ICD-10-CM | POA: Diagnosis present

## 2013-11-19 DIAGNOSIS — Z79899 Other long term (current) drug therapy: Secondary | ICD-10-CM

## 2013-11-19 DIAGNOSIS — H53469 Homonymous bilateral field defects, unspecified side: Secondary | ICD-10-CM | POA: Diagnosis present

## 2013-11-19 DIAGNOSIS — Z8673 Personal history of transient ischemic attack (TIA), and cerebral infarction without residual deficits: Secondary | ICD-10-CM

## 2013-11-19 DIAGNOSIS — Z940 Kidney transplant status: Secondary | ICD-10-CM

## 2013-11-19 DIAGNOSIS — I5032 Chronic diastolic (congestive) heart failure: Secondary | ICD-10-CM | POA: Diagnosis present

## 2013-11-19 DIAGNOSIS — N189 Chronic kidney disease, unspecified: Secondary | ICD-10-CM | POA: Diagnosis present

## 2013-11-19 DIAGNOSIS — Z951 Presence of aortocoronary bypass graft: Secondary | ICD-10-CM

## 2013-11-19 DIAGNOSIS — Z794 Long term (current) use of insulin: Secondary | ICD-10-CM

## 2013-11-19 DIAGNOSIS — I639 Cerebral infarction, unspecified: Secondary | ICD-10-CM

## 2013-11-19 DIAGNOSIS — E1139 Type 2 diabetes mellitus with other diabetic ophthalmic complication: Secondary | ICD-10-CM | POA: Diagnosis present

## 2013-11-19 DIAGNOSIS — I739 Peripheral vascular disease, unspecified: Secondary | ICD-10-CM | POA: Diagnosis present

## 2013-11-19 DIAGNOSIS — R4182 Altered mental status, unspecified: Secondary | ICD-10-CM

## 2013-11-19 DIAGNOSIS — Z7901 Long term (current) use of anticoagulants: Secondary | ICD-10-CM

## 2013-11-19 DIAGNOSIS — M810 Age-related osteoporosis without current pathological fracture: Secondary | ICD-10-CM | POA: Diagnosis present

## 2013-11-19 DIAGNOSIS — I4891 Unspecified atrial fibrillation: Secondary | ICD-10-CM

## 2013-11-19 DIAGNOSIS — R609 Edema, unspecified: Secondary | ICD-10-CM

## 2013-11-19 DIAGNOSIS — E119 Type 2 diabetes mellitus without complications: Secondary | ICD-10-CM

## 2013-11-19 LAB — CBC
HEMATOCRIT: 44.9 % (ref 36.0–46.0)
Hemoglobin: 14.4 g/dL (ref 12.0–15.0)
MCH: 29.9 pg (ref 26.0–34.0)
MCHC: 32.1 g/dL (ref 30.0–36.0)
MCV: 93.2 fL (ref 78.0–100.0)
Platelets: 129 10*3/uL — ABNORMAL LOW (ref 150–400)
RBC: 4.82 MIL/uL (ref 3.87–5.11)
RDW: 14.8 % (ref 11.5–15.5)
WBC: 6.5 10*3/uL (ref 4.0–10.5)

## 2013-11-19 LAB — DIFFERENTIAL
Basophils Absolute: 0 10*3/uL (ref 0.0–0.1)
Basophils Relative: 0 % (ref 0–1)
EOS PCT: 1 % (ref 0–5)
Eosinophils Absolute: 0 10*3/uL (ref 0.0–0.7)
Lymphocytes Relative: 23 % (ref 12–46)
Lymphs Abs: 1.5 10*3/uL (ref 0.7–4.0)
MONO ABS: 0.4 10*3/uL (ref 0.1–1.0)
Monocytes Relative: 5 % (ref 3–12)
Neutro Abs: 4.6 10*3/uL (ref 1.7–7.7)
Neutrophils Relative %: 71 % (ref 43–77)

## 2013-11-19 LAB — RAPID URINE DRUG SCREEN, HOSP PERFORMED
Amphetamines: NOT DETECTED
BENZODIAZEPINES: NOT DETECTED
Barbiturates: NOT DETECTED
COCAINE: NOT DETECTED
Opiates: NOT DETECTED
Tetrahydrocannabinol: NOT DETECTED

## 2013-11-19 LAB — I-STAT CHEM 8, ED
BUN: 27 mg/dL — ABNORMAL HIGH (ref 6–23)
Calcium, Ion: 1.26 mmol/L (ref 1.13–1.30)
Chloride: 97 mEq/L (ref 96–112)
Creatinine, Ser: 1.2 mg/dL — ABNORMAL HIGH (ref 0.50–1.10)
GLUCOSE: 167 mg/dL — AB (ref 70–99)
HCT: 44 % (ref 36.0–46.0)
HEMOGLOBIN: 15 g/dL (ref 12.0–15.0)
Potassium: 4.5 mEq/L (ref 3.7–5.3)
SODIUM: 140 meq/L (ref 137–147)
TCO2: 34 mmol/L (ref 0–100)

## 2013-11-19 LAB — COMPREHENSIVE METABOLIC PANEL
ALT: 18 U/L (ref 0–35)
AST: 21 U/L (ref 0–37)
Albumin: 3.3 g/dL — ABNORMAL LOW (ref 3.5–5.2)
Alkaline Phosphatase: 67 U/L (ref 39–117)
BUN: 27 mg/dL — ABNORMAL HIGH (ref 6–23)
CO2: 34 meq/L — AB (ref 19–32)
Calcium: 10 mg/dL (ref 8.4–10.5)
Chloride: 101 mEq/L (ref 96–112)
Creatinine, Ser: 1 mg/dL (ref 0.50–1.10)
GFR, EST AFRICAN AMERICAN: 63 mL/min — AB (ref >90)
GFR, EST NON AFRICAN AMERICAN: 54 mL/min — AB (ref >90)
GLUCOSE: 174 mg/dL — AB (ref 70–99)
Potassium: 4.8 mEq/L (ref 3.7–5.3)
Sodium: 142 mEq/L (ref 137–147)
Total Bilirubin: 0.5 mg/dL (ref 0.3–1.2)
Total Protein: 6 g/dL (ref 6.0–8.3)

## 2013-11-19 LAB — URINALYSIS, ROUTINE W REFLEX MICROSCOPIC
BILIRUBIN URINE: NEGATIVE
GLUCOSE, UA: NEGATIVE mg/dL
Hgb urine dipstick: NEGATIVE
Ketones, ur: NEGATIVE mg/dL
LEUKOCYTES UA: NEGATIVE
Nitrite: NEGATIVE
PH: 8 (ref 5.0–8.0)
Protein, ur: NEGATIVE mg/dL
Specific Gravity, Urine: 1.015 (ref 1.005–1.030)
Urobilinogen, UA: 0.2 mg/dL (ref 0.0–1.0)

## 2013-11-19 LAB — GLUCOSE, CAPILLARY
GLUCOSE-CAPILLARY: 126 mg/dL — AB (ref 70–99)
Glucose-Capillary: 86 mg/dL (ref 70–99)

## 2013-11-19 LAB — URINE MICROSCOPIC-ADD ON

## 2013-11-19 LAB — I-STAT TROPONIN, ED: Troponin i, poc: 0.01 ng/mL (ref 0.00–0.08)

## 2013-11-19 LAB — PROTIME-INR
INR: 1.78 — ABNORMAL HIGH (ref 0.00–1.49)
Prothrombin Time: 20.2 seconds — ABNORMAL HIGH (ref 11.6–15.2)

## 2013-11-19 LAB — HEMOGLOBIN A1C
Hgb A1c MFr Bld: 6.2 % — ABNORMAL HIGH (ref <5.7)
Mean Plasma Glucose: 131 mg/dL — ABNORMAL HIGH (ref <117)

## 2013-11-19 LAB — SALICYLATE LEVEL: Salicylate Lvl: <2 mg/dL — ABNORMAL LOW (ref 2.8–20.0)

## 2013-11-19 LAB — ETHANOL: Alcohol, Ethyl (B): <11 mg/dL (ref 0–11)

## 2013-11-19 LAB — APTT: aPTT: 36 seconds (ref 24–37)

## 2013-11-19 LAB — CBG MONITORING, ED: Glucose-Capillary: 144 mg/dL — ABNORMAL HIGH (ref 70–99)

## 2013-11-19 MED ORDER — WARFARIN SODIUM 2.5 MG PO TABS: 2.5000 mg | ORAL_TABLET | Freq: Every day | ORAL | Status: DC

## 2013-11-19 MED ORDER — MAGNESIUM OXIDE 400 (241.3 MG) MG PO TABS
400.0000 mg | ORAL_TABLET | Freq: Every day | ORAL | Status: DC
  Filled 2013-11-19: qty 1

## 2013-11-19 MED ORDER — MIRTAZAPINE 7.5 MG PO TABS
7.5000 mg | ORAL_TABLET | Freq: Every day | ORAL | Status: DC
  Filled 2013-11-19: qty 1

## 2013-11-19 MED ORDER — ONDANSETRON HCL 4 MG/2ML IJ SOLN
4.0000 mg | Freq: Four times a day (QID) | INTRAMUSCULAR | Status: DC | PRN
  Administered 2013-11-19: 4 mg via INTRAVENOUS
  Filled 2013-11-19: qty 2

## 2013-11-19 MED ORDER — ASPIRIN 300 MG RE SUPP
300.0000 mg | Freq: Every day | RECTAL | Status: DC
  Administered 2013-11-19 – 2013-11-20 (×2): 300 mg via RECTAL
  Filled 2013-11-19 (×2): qty 1

## 2013-11-19 MED ORDER — INSULIN ASPART 100 UNIT/ML ~~LOC~~ SOLN
0.0000 [IU] | SUBCUTANEOUS | Status: DC
  Administered 2013-11-20: 3 [IU] via SUBCUTANEOUS
  Administered 2013-11-20 – 2013-11-21 (×3): 2 [IU] via SUBCUTANEOUS
  Administered 2013-11-22: 1 [IU] via SUBCUTANEOUS
  Administered 2013-11-22: 2 [IU] via SUBCUTANEOUS
  Administered 2013-11-22 (×2): 3 [IU] via SUBCUTANEOUS
  Administered 2013-11-22 – 2013-11-23 (×2): 2 [IU] via SUBCUTANEOUS
  Administered 2013-11-23: 7 [IU] via SUBCUTANEOUS
  Administered 2013-11-23: 3 [IU] via SUBCUTANEOUS
  Administered 2013-11-23: 5 [IU] via SUBCUTANEOUS

## 2013-11-19 MED ORDER — ESCITALOPRAM OXALATE 10 MG PO TABS: 10.0000 mg | ORAL_TABLET | Freq: Every day | ORAL | Status: DC

## 2013-11-19 MED ORDER — MYCOPHENOLATE SODIUM 180 MG PO TBEC
360.0000 mg | DELAYED_RELEASE_TABLET | Freq: Two times a day (BID) | ORAL | Status: DC
  Filled 2013-11-19: qty 2

## 2013-11-19 MED ORDER — INSULIN GLARGINE 100 UNIT/ML ~~LOC~~ SOLN
6.0000 [IU] | SUBCUTANEOUS | Status: DC
  Filled 2013-11-19: qty 0.06

## 2013-11-19 MED ORDER — PROMETHAZINE HCL 25 MG/ML IJ SOLN
12.5000 mg | Freq: Four times a day (QID) | INTRAMUSCULAR | Status: DC | PRN
  Administered 2013-11-20 (×2): 12.5 mg via INTRAVENOUS
  Filled 2013-11-19 (×2): qty 1

## 2013-11-19 MED ORDER — PREDNISONE 5 MG PO TABS: 5.0000 mg | ORAL_TABLET | Freq: Every morning | ORAL | Status: DC

## 2013-11-19 MED ORDER — HYDRALAZINE HCL 20 MG/ML IJ SOLN: 10.0000 mg | INTRAMUSCULAR | Status: DC | PRN

## 2013-11-19 MED ORDER — FENTANYL CITRATE 0.05 MG/ML IJ SOLN
50.0000 ug | Freq: Once | INTRAMUSCULAR | Status: AC
  Administered 2013-11-19: 50 ug via INTRAVENOUS
  Filled 2013-11-19: qty 2

## 2013-11-19 MED ORDER — TACROLIMUS 1 MG PO CAPS: 2.0000 mg | ORAL_CAPSULE | Freq: Every day | ORAL | Status: DC

## 2013-11-19 MED ORDER — PANTOPRAZOLE SODIUM 40 MG IV SOLR
40.0000 mg | Freq: Every day | INTRAVENOUS | Status: DC
  Administered 2013-11-19: 40 mg via INTRAVENOUS
  Filled 2013-11-19 (×2): qty 40

## 2013-11-19 MED ORDER — ATORVASTATIN CALCIUM 10 MG PO TABS: 10.0000 mg | ORAL_TABLET | ORAL | Status: DC

## 2013-11-19 MED ORDER — SODIUM CHLORIDE 0.9 % IV SOLN
INTRAVENOUS | Status: DC
  Administered 2013-11-19: 18:00:00 via INTRAVENOUS
  Administered 2013-11-20: 20 mL/h via INTRAVENOUS

## 2013-11-19 MED ORDER — MYCOPHENOLATE SODIUM 180 MG PO TBEC: 360.0000 mg | DELAYED_RELEASE_TABLET | Freq: Two times a day (BID) | ORAL | Status: DC

## 2013-11-19 MED ORDER — TACROLIMUS 1 MG PO CAPS
1.0000 mg | ORAL_CAPSULE | Freq: Every day | ORAL | Status: DC
  Filled 2013-11-19: qty 1

## 2013-11-19 MED ORDER — MAGNESIUM 250 MG PO TABS: 1600.0000 mg | ORAL_TABLET | Freq: Every day | ORAL | Status: DC

## 2013-11-19 MED ORDER — ASPIRIN 325 MG PO TABS
325.0000 mg | ORAL_TABLET | Freq: Every day | ORAL | Status: DC
  Administered 2013-11-21 – 2013-11-23 (×3): 325 mg via ORAL
  Filled 2013-11-19 (×4): qty 1

## 2013-11-19 MED ORDER — TACROLIMUS 1 MG PO CAPS: 1.0000 mg | ORAL_CAPSULE | Freq: Two times a day (BID) | ORAL | Status: DC

## 2013-11-19 MED ORDER — ONDANSETRON HCL 4 MG/2ML IJ SOLN
4.0000 mg | Freq: Once | INTRAMUSCULAR | Status: AC
  Administered 2013-11-19: 4 mg via INTRAVENOUS
  Filled 2013-11-19: qty 2

## 2013-11-19 MED ORDER — MORPHINE SULFATE 2 MG/ML IJ SOLN
1.0000 mg | INTRAMUSCULAR | Status: DC | PRN
Start: 2013-11-19 — End: 2013-11-20

## 2013-11-19 NOTE — ED Notes (Signed)
edp cleared for ct

## 2013-11-19 NOTE — Consult Note (Addendum)
Referring Physician: ED    Chief Complaint: CODE STROKE: ALTERED MENTAL STATUS.  HPI:                                                                                                                                         Elizabeth [name] is an 73 y.o. 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 According to her husband, they were home having their usual routine, when she requested to be taken to the bathroom and when she got there she didn't know what to do and needed assistance to do everything, and was poorly responcie and confused. EMS was summoned and found her sitting in a couch " stating off". Upon arrival to the ED she was confused with NIHSS CT brain showed no acute ischemia or hemorrhage but perhaps a hyperdense tip of the BA. In the ER she has been restless, complaining of pain in different body areas and constantly saying " it hurts". She is on coumadin and INR today 1.78   Date last known well: 11/19/13 Time last known well: 10 am tPA Given: no: NIHSS: 5 (but patient also has old deficits) MRS: 4  Past Medical History  Diagnosis Date  . Coronary artery disease   . Chronic kidney disease     s/p cadaveric renal transplant in 2001  . Diabetes mellitus   . PVD (peripheral vascular disease)   . History of orthostatic hypotension   . Hyperlipidemia   . Hypothyroidism   . Anemia   . Pancreatic cyst   . Osteoporosis     recurrent fractures  . Melanoma   . Carotid bruit     left  . Adenomatous polyp   . GERD (gastroesophageal reflux disease)   . Gastritis   . CVA (cerebral infarction)   . Bradycardia   . Renal artery stenosis   . Diabetic retinopathy   . Renal osteodystrophy   . Hyperparathyroidism   . Leg ulcer     from poor fitting  prosthetic  . H/O immunosuppressive therapy   . 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 Fx wrist   . Fx ankle   . Neuropathy   . Nephropathy   . Carotid artery stenosis     mild  . Stroke   . Tubular adenoma   . Colitis   . IBS (irritable bowel syndrome)   . Ischemic colitis   . 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 Esophageal candidiasis   . 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 Fracture of right maxilla 09/21/2012  . Right orbit fracture 09/21/2012  .  TBI (traumatic brain injury) 09/30/2012  . Zygoma fracture 09/21/2012  . Compression fracture of L4 lumbar vertebra   . Pulmonary embolism     Past Surgical History  Procedure Laterality Date  . Kidney transplant  08/15/2000    [facility] in [place]  . 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 Appendectomy    . Cataract extraction    . Cholecystectomy    . Abdominal hysterectomy    . Thyroid surgery    . Tonsillectomy    . Cesarean section      x 2  . Vitrectomy      right eye  . Coronary artery bypass graft    . Colonoscopy w/ biopsies and polypectomy  02/18/2009    adenomatous polyps, diverticulosis, internal hemorrhoids  . Eus  02/04/2010    w/FNA, pancreatic cyst  . Upper gastrointestinal endoscopy  12/01/2009  . Colonoscopy  01/30/2012    Procedure: COLONOSCOPY;  Surgeon: Milus Banister, MD;  Location: [place];  Service: Endoscopy;  Laterality: N/A;  . Esophagogastroduodenoscopy  05/02/2012    Procedure: ESOPHAGOGASTRODUODENOSCOPY (EGD);  Surgeon: Gatha Mayer, MD;  Location: [facility];  Service: Endoscopy;  Laterality: N/A;    Family History  Problem Relation Age of Onset  . Stroke Mother 13  . Heart attack Father 62  . Colon cancer Neg Hx   . Diabetes Neg Hx    Social History:  reports that she has never smoked. She has never used smokeless tobacco. She reports  that she does not drink alcohol or use illicit drugs.  Allergies:  Allergies  Allergen Reactions  . Erythromycin Nausea And Vomiting  . Percocet [Oxycodone-Acetaminophen] Nausea And Vomiting    Medications:                                                                                                                           I have reviewed the patient's current medications.  ROS: unable to obtain due to mental status                                                                                                                      History obtained from chart review  Physical exam: restless but no distress. Blood pressure 196/74, pulse 54, temperature 98.4 F (36.9 C), temperature source Rectal, resp. rate 18, height 5\' 3"  (1.6 m), weight 58.06 kg (128 lb), SpO2 98.00%. Head: normocephalic.  Neck: supple, no bruits, no JVD. Cardiac: no murmurs. Lungs: clear. Abdomen: soft, no tender, no mass. Extremities: left BKA, brace right LE.  Neurologic Examination:                                                                                                      Mental Status: Alert and awake, follows only simple commands but inconsistently. Speech fluent without evidence of aphasia. Cranial Nerves: II: Discs flat bilaterally; Visual fields grossly normal, pupils equal, round, reactive to light and accommodation III,IV, VI: ptosis not present, extra-ocular motions intact bilaterally V,VII: smile symmetric, facial light touch sensation normal bilaterally VIII: hearing normal bilaterally IX,X: gag reflex present XI: bilateral shoulder shrug XII: midline tongue extension without atrophy or fasciculations  Motor: No weakness noted. Sensory: Pinprick and light touch intact throughout, bilaterally Deep Tendon Reflexes:  2 + upper extremities, 1+ LE. Plantars: No tested Cerebellar: Unable to test Gait:  couldn't be tested.    Results for orders placed during the hospital encounter  of 11/19/13 (from the past 48 hour(s))  PROTIME-INR     Status: Abnormal   Collection Time    11/19/13 11:47 AM      Result Value Ref Range   Prothrombin Time 20.2 (*) 11.6 - 15.2 seconds   INR 1.78 (*) 0.00 - 1.49  APTT     Status: None   Collection Time    11/19/13 11:47 AM      Result Value Ref Range   aPTT 36  24 - 37 seconds  CBC     Status: Abnormal   Collection Time    11/19/13 11:47 AM      Result Value Ref Range   WBC 6.5  4.0 - 10.5 K/uL   RBC 4.82  3.87 - 5.11 MIL/uL   Hemoglobin 14.4  12.0 - 15.0 g/dL   HCT 44.9  36.0 - 46.0 %   MCV 93.2  78.0 - 100.0 fL   MCH 29.9  26.0 - 34.0 pg   MCHC 32.1  30.0 - 36.0 g/dL   RDW 14.8  11.5 - 15.5 %   Platelets 129 (*) 150 - 400 K/uL  DIFFERENTIAL     Status: None   Collection Time    11/19/13 11:47 AM      Result Value Ref Range   Neutrophils Relative % 71  43 - 77 %   Neutro Abs 4.6  1.7 - 7.7 K/uL   Lymphocytes Relative 23  12 - 46 %   Lymphs Abs 1.5  0.7 - 4.0 K/uL   Monocytes Relative 5  3 - 12 %   Monocytes Absolute 0.4  0.1 - 1.0 K/uL   Eosinophils Relative 1  0 - 5 %   Eosinophils Absolute 0.0  0.0 - 0.7 K/uL   Basophils Relative 0  0 - 1 %   Basophils Absolute 0.0  0.0 - 0.1 K/uL  I-STAT CHEM 8, ED     Status: Abnormal   Collection Time    11/19/13 11:54 AM      Result Value Ref  Range   Sodium 140  137 - 147 mEq/L   Potassium 4.5  3.7 - 5.3 mEq/L   Chloride 97  96 - 112 mEq/L   BUN 27 (*) 6 - 23 mg/dL   Creatinine, Ser 1.20 (*) 0.50 - 1.10 mg/dL   Glucose, Bld 167 (*) 70 - 99 mg/dL   Calcium, Ion 1.26  1.13 - 1.30 mmol/L   TCO2 34  0 - 100 mmol/L   Hemoglobin 15.0  12.0 - 15.0 g/dL   HCT 44.0  36.0 - 46.0 %  I-STAT TROPOININ, ED     Status: None   Collection Time    11/19/13 11:57 AM      Result Value Ref Range   Troponin i, poc 0.01  0.00 - 0.08 ng/mL   Comment 3            Comment: Due to the release kinetics of cTnI,     a negative result within the first hours     of the onset of symptoms does  not rule out     myocardial infarction with certainty.     If myocardial infarction is still suspected,     repeat the test at appropriate intervals.  CBG MONITORING, ED     Status: Abnormal   Collection Time    11/19/13 12:09 PM      Result Value Ref Range   Glucose-Capillary 144 (*) 70 - 99 mg/dL   Ct Head Wo Contrast  11/19/2013   CLINICAL DATA:  Pain.  History of strokes.  EXAM: CT HEAD WITHOUT CONTRAST  TECHNIQUE: Contiguous axial images were obtained from the base of the skull through the vertex without intravenous contrast.  COMPARISON:  10/04/2012 CT.  09/23/2012 MR.  FINDINGS: Remote right parietal -occipital lobe infarct with encephalomalacia.  Remote left cerebellar infarct with encephalomalacia. Remote small right cerebellar infarct.  Small vessel disease type changes.  The basilar artery is slightly dense, more so than on prior exams. Basilar artery thrombosis cannot be excluded in the proper clinical setting.  Atrophy.  Ventricular prominence possibly related to central atrophy although cannot exclude hydrocephalus. This is without change.  No intracranial mass lesion noted on this unenhanced exam.  Prominent vascular calcifications.  Partial opacification right sphenoid sinus air cell.  IMPRESSION: Remote right parietal -occipital lobe infarct with encephalomalacia.  Remote left cerebellar infarct with encephalomalacia. Remote small right cerebellar infarct.  Small vessel disease type changes.  The basilar artery is slightly dense, more so than on prior exams. Basilar artery thrombosis cannot be excluded in the proper clinical setting.  Atrophy.  Ventricular prominence possibly related to central atrophy although cannot exclude hydrocephalus. This is without change.  Prominent vascular calcifications.  These results were called by telephone at the time of interpretation on 11/19/2013 at 12:07 PM to Dr. Armida Sans , who verbally acknowledged these results.   Electronically Signed   By: Chauncey Cruel M.D.   On: 11/19/2013 12:12     Assessment: 73 y.o. female with acute onset altered mental status. No focal findings on neuro-exam. CT brain showed no acute ischemia or hemorrhage but concern of hyperdense tip of BA. She is within the window for IV tpa but INR 1.78 which precludes IV tpa and minimal deficits (NIHSS 5, but in reality this score is mainly due to old deficits). There is some suggestion of hyperdense tip of the BA, but I am unsure she has a confusion in the context of a basilar artery ischemia. Will  get DWI MRI now follow by MRA if evidence of stroke. Patient seems to be a poor candidate for endovascular, is s/p kidney transplant, and thus I did not order CTA brain (in addition, she is a transplant patient intervention) Admit to medicine. Will follow up.  Stroke Risk Factors - age, DM ,hyperlipidemia, CAD, atrial fibrillation, prior stroke.  Plan: 1. HgbA1c, fasting lipid panel 2. MRI, MRA  of the brain without contrast 3. Echocardiogram 4. Carotid dopplers 5. Prophylactic therapy-Anticoagulation: Coumadin 6. Risk factor modification 7. Telemetry monitoring 8. Frequent neuro checks 9. PT/OT SLP  Dorian Pod, MD [facility] [phone]  11/19/2013, 12:23 PM  Addendum: Patient very restless, agitated during MRI and the study was not successfully complete. Images very distorted by motion, but no acute infarct was identified.

## 2013-11-19 NOTE — ED Notes (Signed)
Pt placed on 2l o2 related to spo2 87-88

## 2013-11-19 NOTE — ED Notes (Signed)
Pt to ct scan.

## 2013-11-19 NOTE — ED Notes (Signed)
Pt to MRI with rapid response team

## 2013-11-19 NOTE — Progress Notes (Signed)
Escorted family to trauma bay C to visit with patient. Patient reported to ED and code stroke called at 1122 pt arrived via ems from home. Per family pt had sudden onset of altered mental status. Pt speech is clear but confused.  Pt experience high levels of anxiety. Family at bedside and consulting with Physician. Patient going to CT and per doctor patient will likely be admitted.  11/19/13 1200  Clinical Encounter Type  Visited With Patient and family together;Health care provider  Visit Type Initial;Spiritual support;ED  Spiritual Encounters  Spiritual Needs Emotional  Stress Factors  Patient Stress Factors Exhausted  Family Stress Factors None identified  Jaclynn Major, [place]

## 2013-11-19 NOTE — ED Notes (Signed)
Family at bedside. Husband states at 10am pt required increase prompting to get to bathroom. Pt appeared confused from baseline. Pt continued to mae. Family speaking with neurosurgeon.

## 2013-11-19 NOTE — H&P (Signed)
73 yo F with multiple medical problems presents with acute mental status changes (confusion, weakness) at 10 AM. No medication changes or known sick contacts. No urinary incontinence, cough, CP, SOB, emesis preceding onset of symptoms.   Has history of DM2, strokes, recurrent UTI, CHF, A fib on coumadin, renal tranplant, CAD s/p CBG.  History obtained from patient, her husband [name] and daughter Nira Conn.   BP 208/70  Pulse 58  Temp(Src) 98.4 F (36.9 C) (Oral)  Resp 16  Ht 5\' 3"  (1.6 m)  Wt 128 lb (58.06 kg)  BMI 22.68 kg/m2  SpO2 100% General appearance: alert, cooperative and no distress, appears fatigue, opens eyes to touch and voice and follow commands  Lungs: clear to auscultation bilaterally Heart: regular rate and rhythm, S1, S2 normal, no murmur, click, rub or gallop Extremities: LLE BKA, R leg deformity  Neurologic:  Alert Oriented to person, place (hospital) year (2015) No CN defects noted  Follow commands normal UE strength on R, weak on L  No dysmetria.   A/P: 76 to F with acute mental status changes and L sided weakness concerning for acute stroke. Agree with stroke w/u including repeat MRI. CT head w/o acute infarct, hemorrhage or mass.  Patient with nausea post fentanyl.symptom control.  Elevated BP allowing in the setting of likely acute stroke, SBP goal 180-200. Prn hydralazine ordered.   CXR, UCx,  Ammonia, salicylate level to rule out other causes of acute confusion although unlikely.

## 2013-11-19 NOTE — ED Provider Notes (Signed)
CSN: [id number]     Arrival date & time 11/19/13  1141 History   First MD Initiated Contact with Patient 11/19/13 1146     Chief Complaint  Patient presents with  . Code Stroke     (Consider location/radiation/quality/duration/timing/severity/associated sxs/prior Treatment) HPI Level 5 caveat due to confusion Pt brought to the ED via EMS as Code Stroke. Limited history available. Apparently was talking to husband around 1000 and became suddenly altered. Per EMS, ?left sided drift. She has a complex past medical and surgical history which includes kidney transplant in 2001.    Past Medical History  Diagnosis Date  . Coronary artery disease   . Chronic kidney disease     s/p cadaveric renal transplant in 2001  . Diabetes mellitus   . PVD (peripheral vascular disease)   . History of orthostatic hypotension   . Hyperlipidemia   . Hypothyroidism   . Anemia   . Pancreatic cyst   . Osteoporosis     recurrent fractures  . Melanoma   . Carotid bruit     left  . Adenomatous polyp   . GERD (gastroesophageal reflux disease)   . Gastritis   . CVA (cerebral infarction)   . Bradycardia   . Renal artery stenosis   . Diabetic retinopathy   . Renal osteodystrophy   . Hyperparathyroidism   . Leg ulcer     from poor fitting prosthetic  . H/O immunosuppressive therapy   . 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 Fx wrist   . Fx ankle   . Neuropathy   . Nephropathy   . Carotid artery stenosis     mild  . Stroke   . Tubular adenoma   . Colitis   . IBS (irritable bowel syndrome)   . Ischemic colitis   . 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 Esophageal candidiasis   . 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 Fracture of right maxilla 09/21/2012  . Right orbit fracture 09/21/2012  . TBI (traumatic brain injury) 09/30/2012  . Zygoma fracture 09/21/2012  . Compression fracture of L4 lumbar  vertebra   . Pulmonary embolism    Past Surgical History  Procedure Laterality Date  . Kidney transplant  08/15/2000    [facility] in [place]  . 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 Appendectomy    . Cataract extraction    . Cholecystectomy    . Abdominal hysterectomy    . Thyroid surgery    . Tonsillectomy    . Cesarean section      x 2  . Vitrectomy      right eye  . Coronary artery bypass graft    . Colonoscopy w/ biopsies and polypectomy  02/18/2009    adenomatous polyps, diverticulosis, internal hemorrhoids  . Eus  02/04/2010    w/FNA, pancreatic cyst  . Upper gastrointestinal endoscopy  12/01/2009  . Colonoscopy  01/30/2012    Procedure: COLONOSCOPY;  Surgeon: Milus Banister, MD;  Location: [place];  Service: Endoscopy;  Laterality: N/A;  . Esophagogastroduodenoscopy  05/02/2012    Procedure: ESOPHAGOGASTRODUODENOSCOPY (EGD);  Surgeon: Gatha Mayer, MD;  Location: [facility];  Service: Endoscopy;  Laterality: N/A;   Family History  Problem Relation Age of Onset  . Stroke Mother 76  . Heart attack Father 54  .  Colon cancer Neg Hx   . Diabetes Neg Hx    History  Substance Use Topics  . Smoking status: Never Smoker   . Smokeless tobacco: Never Used  . Alcohol Use: No   OB History   Grav Para Term Preterm Abortions TAB SAB Ect Mult Living                 Review of Systems  Unable to assess due to mental status.    Allergies  Percocet  Home Medications   Current Outpatient Rx  Name  Route  Sig  Dispense  Refill  . atorvastatin (LIPITOR) 10 MG tablet   Oral   Take 1 tablet (10 mg total) by mouth every other day.   30 tablet   1   . Cholecalciferol (VITAMIN D3) 400 UNITS CAPS   Oral   Take 2,000 Units by mouth at bedtime.          . docusate sodium (COLACE) 100 MG capsule   Oral   Take 100 mg by mouth daily.         Marland Kitchen escitalopram (LEXAPRO) 10 MG tablet   Oral   Take 10 mg by mouth daily.          . ferrous fumarate (HEMOCYTE - 106 MG FE) 325 (106 FE) MG TABS tablet   Oral   Take 1 tablet by mouth 2 (two) times daily.         Marland Kitchen HYDROcodone-acetaminophen (NORCO/VICODIN) 5-325 MG per tablet   Oral   Take 2 tablets by mouth 2 (two) times daily as needed for pain (2 at night  qhs and more if needed).         . hyoscyamine (ANASPAZ) 0.125 MG TBDP disintergrating tablet   Sublingual   Place 0.125 mg under the tongue as needed.         . insulin glargine (LANTUS) 100 UNIT/ML injection   Subcutaneous   Inject 6 Units into the skin every morning.          . insulin lispro (HUMALOG) 100 UNIT/ML injection   Subcutaneous   Inject 2-9 Units into the skin 4 (four) times daily -  before meals and at bedtime. Per sliding scale.         . levothyroxine (SYNTHROID, LEVOTHROID) 175 MCG tablet   Oral   Take 175 mcg by mouth daily. 1 tablet daily on Saturday and Sunday         . Magnesium 250 MG TABS   Oral   Take 1,600 mg by mouth daily.          . mirtazapine (REMERON) 7.5 MG tablet   Oral   Take 7.5 mg by mouth at bedtime.          . Multiple Vitamin (MULTIVITAMIN WITH MINERALS) TABS   Oral   Take 1 tablet by mouth daily.         . mycophenolate (MYFORTIC) 360 MG TBEC   Oral   Take 360 mg by mouth 2 (two) times daily.          . nitroGLYCERIN (NITROSTAT) 0.4 MG SL tablet   Sublingual   Place 1 tablet (0.4 mg total) under the tongue every 5 (five) minutes as needed for chest pain.   25 tablet   3   . omeprazole (PRILOSEC) 20 MG capsule   Oral   Take 20 mg by mouth at bedtime.          . ondansetron (ZOFRAN)  8 MG tablet   Oral   Take by mouth every morning.          . potassium chloride (K-DUR) 10 MEQ tablet   Oral   Take 10 mEq by mouth daily.         . predniSONE (DELTASONE) 5 MG tablet   Oral   Take 5 mg by mouth every morning.          . promethazine (PHENERGAN) 12.5 MG tablet   Oral   Take 12.5 mg by mouth as needed.           . tacrolimus (PROGRAF) 0.5 MG capsule   Oral   Take 1-2 mg by mouth 2 (two) times daily. Take 2mg  in the am & 1mg  at night         . tamsulosin (FLOMAX) 0.4 MG CAPS capsule   Oral   Take 0.4 mg by mouth daily.         Marland Kitchen warfarin (COUMADIN) 5 MG tablet   Oral   Take 2.5 mg by mouth daily. Pt can only take coumadin brand name.          There were no vitals taken for this visit. Physical Exam  Nursing note and vitals reviewed. Constitutional: She appears well-developed and well-nourished. She appears distressed (Pt crying out, saying help me, complaining of pain in various and inconsistent locations).  HENT:  Head: Normocephalic and atraumatic.  Eyes: EOM are normal. Pupils are equal, round, and reactive to light.  Neck: Normal range of motion. Neck supple.  Cardiovascular: Normal rate, normal heart sounds and intact distal pulses.   Pulmonary/Chest: Effort normal and breath sounds normal.  Abdominal: Bowel sounds are normal. She exhibits no distension. There is no tenderness.  Musculoskeletal: Normal range of motion. She exhibits no edema and no tenderness.  Neurological: She is alert. No cranial nerve deficit.  Mvoes all four extremities, s/p L BKA, R leg in brace. For full NIHSS, please see Stroke Team assessment.   Skin: Skin is warm and dry. No rash noted.  Psychiatric: She has a normal mood and affect.    ED Course  Procedures (including critical care time) Labs Review Labs Reviewed  PROTIME-INR - Abnormal; Notable for the following:    Prothrombin Time 20.2 (*)    INR 1.78 (*)    All other components within normal limits  CBC - Abnormal; Notable for the following:    Platelets 129 (*)    All other components within normal limits  COMPREHENSIVE METABOLIC PANEL - Abnormal; Notable for the following:    CO2 34 (*)    Glucose, Bld 174 (*)    BUN 27 (*)    Albumin 3.3 (*)    GFR calc non Af Amer 54 (*)    GFR calc Af Amer 63 (*)    All other components within  normal limits  URINALYSIS, ROUTINE W REFLEX MICROSCOPIC - Abnormal; Notable for the following:    APPearance TURBID (*)    All other components within normal limits  HEMOGLOBIN A1C - Abnormal; Notable for the following:    Hemoglobin A1C 6.2 (*)    Mean Plasma Glucose 131 (*)    All other components within normal limits  URINE MICROSCOPIC-ADD ON - Abnormal; Notable for the following:    Squamous Epithelial / LPF FEW (*)    Bacteria, UA FEW (*)    All other components within normal limits  SALICYLATE LEVEL - Abnormal; Notable for the following:  Salicylate Lvl <2.0 (*)    All other components within normal limits  GLUCOSE, CAPILLARY - Abnormal; Notable for the following:    Glucose-Capillary 126 (*)    All other components within normal limits  PROTIME-INR - Abnormal; Notable for the following:    Prothrombin Time 21.3 (*)    INR 1.91 (*)    All other components within normal limits  BASIC METABOLIC PANEL - Abnormal; Notable for the following:    Glucose, Bld 133 (*)    GFR calc non Af Amer 59 (*)    GFR calc Af Amer 68 (*)    All other components within normal limits  CBC - Abnormal; Notable for the following:    Platelets 124 (*)    All other components within normal limits  GLUCOSE, CAPILLARY - Abnormal; Notable for the following:    Glucose-Capillary 69 (*)    All other components within normal limits  GLUCOSE, CAPILLARY - Abnormal; Notable for the following:    Glucose-Capillary 132 (*)    All other components within normal limits  I-STAT CHEM 8, ED - Abnormal; Notable for the following:    BUN 27 (*)    Creatinine, Ser 1.20 (*)    Glucose, Bld 167 (*)    All other components within normal limits  CBG MONITORING, ED - Abnormal; Notable for the following:    Glucose-Capillary 144 (*)    All other components within normal limits  URINE CULTURE  ETHANOL  APTT  DIFFERENTIAL  URINE RAPID DRUG SCREEN (HOSP PERFORMED)  LIPID PANEL  AMMONIA  GLUCOSE, CAPILLARY   I-STAT TROPOININ, ED  I-STAT TROPOININ, ED   Imaging Review Ct Head Wo Contrast  11/19/2013   CLINICAL DATA:  Pain.  History of strokes.  EXAM: CT HEAD WITHOUT CONTRAST  TECHNIQUE: Contiguous axial images were obtained from the base of the skull through the vertex without intravenous contrast.  COMPARISON:  10/04/2012 CT.  09/23/2012 MR.  FINDINGS: Remote right parietal -occipital lobe infarct with encephalomalacia.  Remote left cerebellar infarct with encephalomalacia. Remote small right cerebellar infarct.  Small vessel disease type changes.  The basilar artery is slightly dense, more so than on prior exams. Basilar artery thrombosis cannot be excluded in the proper clinical setting.  Atrophy.  Ventricular prominence possibly related to central atrophy although cannot exclude hydrocephalus. This is without change.  No intracranial mass lesion noted on this unenhanced exam.  Prominent vascular calcifications.  Partial opacification right sphenoid sinus air cell.  IMPRESSION: Remote right parietal -occipital lobe infarct with encephalomalacia.  Remote left cerebellar infarct with encephalomalacia. Remote small right cerebellar infarct.  Small vessel disease type changes.  The basilar artery is slightly dense, more so than on prior exams. Basilar artery thrombosis cannot be excluded in the proper clinical setting.  Atrophy.  Ventricular prominence possibly related to central atrophy although cannot exclude hydrocephalus. This is without change.  Prominent vascular calcifications.  These results were called by telephone at the time of interpretation on 11/19/2013 at 12:07 PM to Dr. Leroy Kennedy , who verbally acknowledged these results.   Electronically Signed   By: Bridgett Larsson M.D.   On: 11/19/2013 12:12     EKG Interpretation   Date/Time:  Tuesday November 19 2013 12:05:54 EDT Ventricular Rate:  53 PR Interval:    QRS Duration: 76 QT Interval:  447 QTC Calculation: 420 R Axis:   70 Text Interpretation:   Sinus bradycardia Anteroseptal infarct, age  indeterminate Since last tracing Rate slower Confirmed by  SHELDON  MD,  Juanda Crumble [phone]) on 11/19/2013 12:19:27 PM      MDM   Final diagnoses:  Altered mental status    Pt seen and evaluated by stroke team on arrival. She is not a candidate for tPA given unclear symptoms. Unable to lie still for MRI, complaining of pain in head and abdomen. Husband states abdominal pain is chronic. She is hypertensive, but in setting of possible stroke, will not aggressively lower BP. Admit to medical service for further evaluation.     Charles B. Karle Starch, MD 11/20/13 [phone]

## 2013-11-19 NOTE — ED Notes (Signed)
Pt is actively vomiting clear sputum small amount noted edp informed. Zofran given.

## 2013-11-19 NOTE — ED Notes (Signed)
Code stroke called at 23 pt arrived via ems from home. Per family pt had sudden onset of altered mental status. Pt MAE randomly, speech is clear but confused. Pt moaning stating multiple areas of pain. Per family no seizure activity noted. Takes coumadin for afib and hx previous stroke with left sided deficit.

## 2013-11-19 NOTE — Code Documentation (Addendum)
73 yo female who was at home with her husband, and around 1000 had sudden change in mental status, with rambling speech. She was able to move all extremities, but appeared confused.  EMS was called and they activated code stroke at 1122. (see log for times)  On arrival, she was crying out in pain, but pain varied from "my head" to "my stomach" to "my elbow", etc. She was unable to say the month or her age.  She does take coumadin for afib, and has a donated kidney x 47yrs. Her BP is elevated, 200/100 and her HR is now in the 50s (treated in the field for RVR with cardizem) CT shows "basilar artery is slightly dense, more so than on prior exams. Basilar artery thrombosis cannot be excluded in the proper clinical setting.".Marland Kitchenalong with remote infarcts R parietal-occip and L cerebellar. She continured restless and diff to examine, unable to focus on pieces of the NIHSS exam.(see doc flowsheet). Her INR =1.78, so she is not a candidate for thrombolysis.  Her mRS is 4; she was discharged from a SNF in Dec. And is now back home with her husband.We attempted MRI, but she screamed through out the DWI, and we felt it was in her best interest to end the exam since it was unlikely to change her treatment course. She was returned to the ED; handoff was done with her RN and the MD and EDP were updated.  She cont to be hypertensive:  180-200/80-100.  Husband and dtr updated.

## 2013-11-19 NOTE — H&P (Signed)
[facility] Admission History and Physical Service Pager: [phone]  Patient name: Elizabeth [name] Medical record number: [id number] Date of birth: 09/18/1940 Age: 73 y.o. Gender: female  Primary Care Provider: Donetta Potts, MD Consultants: Neurology  Code Status: Full  Chief Complaint: Sudden mental status change  Assessment and Plan: Elizabeth [name] is a 73 y.o. female presenting with sudden mental status change with concern for acute stroke . PMH is significant for stroke, DM2, hyperlipidemia, HTN, CAD s/p CABG, renal transplant, A. fib on anticoagulation, irritable bowel syndrome, GERD, diastolic CHF.   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 Other etiology possibly include infectious (UA and CXR clean), medication-induced (no new meds), or metabolic - Seen by Neurology in ED who wants admissiopn for full stroke workup - Admit to telemetry - CT head old infarct in right parietal and left cerebellar areas, small vessel disease changes, and question of basilar artery thrombosis - NPO until RN swallow screen, continue home meds for now but will hold if swallow not cleared - SLP/PT/OT eval - MRI/MRA unable to be completed due to mental status - MRI/ MRA, Carotid dopplers, TTE - Allow for permissive hypertension for 24 hours, treat over 220/120 - ASA, continue Lipitor - Risk labs: A1c, lipid panel - Check salicylate and ammonia  Desaturation - Per nursing started shortly after vomiting, some concern for aspiration - Will keep n.p.o. until swallow cleared by nursing or SLP - 2 view chest x-ray, O2 as needed  DM2 - A1c 09/21/2012 6.3 - Attenuated to 6 units daily, hold home sliding scale, Q4 sliding scale here - Repeat A1c pending  HTN - Elevated here as high as 212/65, will allow for permissive  hypertension - Not currently taking diuretics, prescribed by cardiology for weight over 136 so will hold with weight today 128  CKD s/p renal transplant - Creatinine 1.0 - Continue antirejection meds: Tacrolimus, prednisone, mycophenolate - Will call nephrology as her swallow may not be cleared and she may miss some meds  CAD, S/P Single vessel CABG in 2001 - Consider BB if needed, ASA - No chest pain - EKG with slightly more prominent Q waves in V1/V2.  - monitor on tele  A. Fib - Rate controlled - Continue  Diastolic CHF - Appears to be overall euvolemic, weight less than her goal dry weight - Will be evaluated with TTE during this admission - Diuretics if needed  IBS - holding hyoscyamine, or if necessary GERD- IV PPI Hypothyroidism - continue Synthroid, will order IV if necessary   FEN/GI: n.p.o. for now, IV tenderness at 75 mL/hr Prophylaxis: Coumadin, if her swallows not cleared will change to heparin drip,   Disposition: Admit to telemetry for stroke workup and close monitoring  History of Present Illness: Elizabeth [name] is a 73 y.o. female presenting with an acute mental status change this morning. Her husband and daughter are at bedside and described it after breakfast he acutely went unconscious/unresponsive. She was difficult to arouse and when she did wake up she was confused and did not know their names. She's been transferring from bed to wheelchair easily and had to be instructed on how to do this and still had difficulty with her task. They state that since she's gone to the ER she's been very anxious but has begun to know their names.  She was  brought to the ER and code stroke was called. She did not receive TPA due to INR 1.78. CT head has concern for basilar artery thrombosis but an MRI could not be completed due to her mental status.  The family states that she previously had several UTIs and did not seem right like this except when she septic. She has not  complained of any dysuria or other symptoms of illness recently. She was seen in the beginning of the month for weight gain and dyspnea and improved with aggressive diuresis by her cardiologist.  Review Of Systems: Level V caveat applies as patient is acutely encephalopathic.   Patient Active Problem List   Diagnosis Date Noted  . Altered mental status 11/19/2013  . Acute diastolic CHF (congestive heart failure) 10/21/2013  . H/O kidney transplant 10/21/2013  . Encounter for therapeutic drug monitoring 09/17/2013  . UTI (lower urinary tract infection) 09/27/2012  . Concussion, unspecified 09/21/2012  . 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 Orthostatic hypotension 06/06/2012  . 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 Constipation 10/11/2011  . Nocturnal polyuria 03/31/2011  . Atrial fibrillation 01/28/2011  . Encounter for long-term (current) use of anticoagulants 01/28/2011  . CVA (cerebral vascular accident) 01/28/2011  . DVT of upper extremity (deep vein thrombosis) 01/28/2011  . Edema 01/10/2011  . SINUS BRADYCARDIA 12/14/2009  . GERD 12/08/2009  . DIZZINESS 05/20/2009  . DIABETES MELLITUS, TYPE II 02/11/2009  . HYPERLIPIDEMIA-MIXED 02/09/2009  . HYPERTENSION, BENIGN 02/09/2009  . CAD, NATIVE VESSEL 02/09/2009  . CAROTID BRUITS, BILATERAL 02/09/2009  . 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 Irritable bowel syndrome 08/18/2008  . COLONIC POLYPS, HX OF 08/18/2008   Past Medical History: Past Medical History  Diagnosis Date  . Coronary artery disease   . Chronic kidney disease     s/p cadaveric renal transplant in 2001  . Diabetes mellitus   . PVD (peripheral vascular disease)   . History of orthostatic hypotension   . Hyperlipidemia   . Hypothyroidism   . Anemia   . Pancreatic cyst   . Osteoporosis     recurrent fractures  . Melanoma   . Carotid bruit     left  . Adenomatous polyp   . GERD  (gastroesophageal reflux disease)   . Gastritis   . CVA (cerebral infarction)   . Bradycardia   . Renal artery stenosis   . Diabetic retinopathy   . Renal osteodystrophy   . Hyperparathyroidism   . Leg ulcer     from poor fitting prosthetic  . H/O immunosuppressive therapy   . 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 Fx wrist   . Fx ankle   . Neuropathy   . Nephropathy   . Carotid artery stenosis     mild  . Stroke   . Tubular adenoma   . Colitis   . IBS (irritable bowel syndrome)   . Ischemic colitis   . 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 Esophageal candidiasis   . 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 Fracture of right maxilla 09/21/2012  . Right orbit fracture 09/21/2012  . TBI (traumatic brain injury) 09/30/2012  . Zygoma fracture 09/21/2012  . Compression fracture of L4 lumbar vertebra   . Pulmonary embolism    Past Surgical History: Past Surgical History  Procedure Laterality Date  . Kidney transplant  08/15/2000  [facility] in [place]  . 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 Appendectomy    . Cataract extraction    . Cholecystectomy    . Abdominal hysterectomy    . Thyroid surgery    . Tonsillectomy    . Cesarean section      x 2  . Vitrectomy      right eye  . Coronary artery bypass graft    . Colonoscopy w/ biopsies and polypectomy  02/18/2009    adenomatous polyps, diverticulosis, internal hemorrhoids  . Eus  02/04/2010    w/FNA, pancreatic cyst  . Upper gastrointestinal endoscopy  12/01/2009  . Colonoscopy  01/30/2012    Procedure: COLONOSCOPY;  Surgeon: Milus Banister, MD;  Location: [facility];  Service: Endoscopy;  Laterality: N/A;  . Esophagogastroduodenoscopy  05/02/2012    Procedure: ESOPHAGOGASTRODUODENOSCOPY (EGD);  Surgeon: Gatha Mayer, MD;  Location: [facility];  Service: Endoscopy;  Laterality:  N/A;   Social History: History  Substance Use Topics  . Smoking status: Never Smoker   . Smokeless tobacco: Never Used  . Alcohol Use: No   Additional social history: Please also refer to relevant sections of EMR.  Family History: Family History  Problem Relation Age of Onset  . Stroke Mother 26  . Heart attack Father 56  . Colon cancer Neg Hx   . Diabetes Neg Hx    Allergies and Medications: Allergies  Allergen Reactions  . Erythromycin Nausea And Vomiting  . Percocet [Oxycodone-Acetaminophen] Nausea And Vomiting   No current facility-administered medications on file prior to encounter.   Current Outpatient Prescriptions on File Prior to Encounter  Medication Sig Dispense Refill  . atorvastatin (LIPITOR) 10 MG tablet Take 1 tablet (10 mg total) by mouth every other day.  30 tablet  1  . Cholecalciferol (VITAMIN D3) 400 UNITS CAPS Take 2,000 Units by mouth at bedtime.       . docusate sodium (COLACE) 100 MG capsule Take 100 mg by mouth daily.      Marland Kitchen escitalopram (LEXAPRO) 10 MG tablet Take 10 mg by mouth daily.      . ferrous fumarate (HEMOCYTE - 106 MG FE) 325 (106 FE) MG TABS tablet Take 1 tablet by mouth 2 (two) times daily.      Marland Kitchen HYDROcodone-acetaminophen (NORCO/VICODIN) 5-325 MG per tablet Take 2 tablets by mouth 2 (two) times daily as needed for pain (2 at night  qhs and more if needed).      . hyoscyamine (ANASPAZ) 0.125 MG TBDP disintergrating tablet Place 0.125 mg under the tongue as needed.      . insulin glargine (LANTUS) 100 UNIT/ML injection Inject 6 Units into the skin every morning.       . insulin lispro (HUMALOG) 100 UNIT/ML injection Inject 2-9 Units into the skin 4 (four) times daily -  before meals and at bedtime. Per sliding scale.      . levothyroxine (SYNTHROID, LEVOTHROID) 175 MCG tablet Take 175 mcg by mouth daily. 1 tablet daily on Saturday and Sunday      . Magnesium 250 MG TABS Take 1,600 mg by mouth daily.       . mirtazapine (REMERON) 7.5 MG  tablet Take 7.5 mg by mouth at bedtime.       . Multiple Vitamin (MULTIVITAMIN WITH MINERALS) TABS Take 1 tablet by mouth daily.      . mycophenolate (MYFORTIC) 360 MG TBEC Take 360 mg by  mouth 2 (two) times daily.       . nitroGLYCERIN (NITROSTAT) 0.4 MG SL tablet Place 1 tablet (0.4 mg total) under the tongue every 5 (five) minutes as needed for chest pain.  25 tablet  3  . omeprazole (PRILOSEC) 20 MG capsule Take 20 mg by mouth at bedtime.       . ondansetron (ZOFRAN) 8 MG tablet Take by mouth every morning.       . potassium chloride (K-DUR) 10 MEQ tablet Take 10 mEq by mouth daily.      . predniSONE (DELTASONE) 5 MG tablet Take 5 mg by mouth every morning.       . promethazine (PHENERGAN) 12.5 MG tablet Take 12.5 mg by mouth as needed.       . tacrolimus (PROGRAF) 0.5 MG capsule Take 1-2 mg by mouth 2 (two) times daily. Take 2mg  in the am & 1mg  at night      . tamsulosin (FLOMAX) 0.4 MG CAPS capsule Take 0.4 mg by mouth daily.      Marland Kitchen warfarin (COUMADIN) 5 MG tablet Take 2.5 mg by mouth daily. Pt can only take coumadin brand name.        Objective: BP 185/68  Pulse 55  Temp(Src) 98.4 F (36.9 C) (Rectal)  Resp 16  Ht 5\' 3"  (1.6 m)  Wt 128 lb (58.06 kg)  BMI 22.68 kg/m2  SpO2 96% Exam: Gen: NAD, alert, cooperative with exam HEENT: NCAT, unable to check extraocular movements due to mental status, pupils irregular and nonreactive bilaterally, MMM CV: Distant heart sounds, RRR Resp: Breath sounds clear but diminished throughout, soft crackles appreciated in the right lower anterior field Abd: Soft , mild tenderness throughout to palpation, no guarding  Ext:  2+ pitting edema on right lower extremity, BKA on the left, right lower leg with 2 large bony calluses about halfway down from the knee Neuro:  somnolent but easily arousable, limited by mental status, strength 5/5 on the right upper extremity, 3/5 the left upper extremity, sensation intact in bilateral upper and right lower  extremity, speech clear but limited   Labs and Imaging: CBC BMET   Recent Labs Lab 11/19/13 1147 11/19/13 1154  WBC 6.5  --   HGB 14.4 15.0  HCT 44.9 44.0  PLT 129*  --     Recent Labs Lab 11/19/13 1147 11/19/13 1154  NA 142 140  K 4.8 4.5  CL 101 97  CO2 34*  --   BUN 27* 27*  CREATININE 1.00 1.20*  GLUCOSE 174* 167*  CALCIUM 10.0  --       Recent Labs Lab 11/18/13 11/19/13 1147  AST  --  21  ALT  --  18  ALKPHOS  --  67  BILITOT  --  0.5  PROT  --  6.0  ALBUMIN  --  3.3*  INR 1.8 1.78*     11/19/2013 13:10  Color, Urine YELLOW  APPearance TURBID (A)  Specific Gravity, Urine 1.015  pH 8.0  Glucose NEGATIVE  Bilirubin Urine NEGATIVE  Ketones, ur NEGATIVE  Protein NEGATIVE  Urobilinogen, UA 0.2  Nitrite NEGATIVE  Leukocytes, UA NEGATIVE  Hgb urine dipstick NEGATIVE  Urine-Other AMORPHOUS URATES/PHOSPHATES  WBC, UA 3-6  RBC / HPF 0-2  Squamous Epithelial / LPF FEW (A)  Bacteria, UA FEW (A)  Amphetamines NONE DETECTED  Barbiturates NONE DETECTED  Benzodiazepines NONE DETECTED  Opiates NONE DETECTED  COCAINE NONE DETECTED  Tetrahydrocannabinol NONE DETECTED   Ethanol<11  CT head 11/19/2013 IMPRESSION: Remote right parietal -occipital lobe infarct with encephalomalacia.  Remote left cerebellar infarct with encephalomalacia. Remote small  right cerebellar infarct.  Small vessel disease type changes.  The basilar artery is slightly dense, more so than on prior exams.  Basilar artery thrombosis cannot be excluded in the proper clinical  setting.  Atrophy.  Ventricular prominence possibly related to central atrophy although  cannot exclude hydrocephalus. This is without change.  Prominent vascular calcifications.  MRI 11/19/2013 - incomplete images do to patient's middle status, collected images were motion degradated  EKG 11/19/2013: Sinus bradycardia Q waves in V1 and V2 Q waves slightly more prominent than last 1 month ago)  Timmothy Euler, MD 11/19/2013, 2:48 PM PGY-2, [facility] pager: [phone], text pages welcome

## 2013-11-20 ENCOUNTER — Inpatient Hospital Stay (HOSPITAL_COMMUNITY): Payer: Medicare Other

## 2013-11-20 DIAGNOSIS — E119 Type 2 diabetes mellitus without complications: Secondary | ICD-10-CM

## 2013-11-20 DIAGNOSIS — I4891 Unspecified atrial fibrillation: Secondary | ICD-10-CM

## 2013-11-20 DIAGNOSIS — Z7901 Long term (current) use of anticoagulants: Secondary | ICD-10-CM

## 2013-11-20 DIAGNOSIS — I059 Rheumatic mitral valve disease, unspecified: Secondary | ICD-10-CM

## 2013-11-20 LAB — CBC
HEMATOCRIT: 38.7 % (ref 36.0–46.0)
Hemoglobin: 12.4 g/dL (ref 12.0–15.0)
MCH: 29.5 pg (ref 26.0–34.0)
MCHC: 32 g/dL (ref 30.0–36.0)
MCV: 91.9 fL (ref 78.0–100.0)
PLATELETS: 124 10*3/uL — AB (ref 150–400)
RBC: 4.21 MIL/uL (ref 3.87–5.11)
RDW: 15 % (ref 11.5–15.5)
WBC: 5.3 10*3/uL (ref 4.0–10.5)

## 2013-11-20 LAB — LIPID PANEL
CHOL/HDL RATIO: 1.8 ratio
Cholesterol: 118 mg/dL (ref 0–200)
HDL: 65 mg/dL (ref >39)
LDL CALC: 39 mg/dL (ref 0–99)
Triglycerides: 72 mg/dL (ref <150)
VLDL: 14 mg/dL (ref 0–40)

## 2013-11-20 LAB — TROPONIN I: Troponin I: <0.3 ng/mL (ref <0.30)

## 2013-11-20 LAB — BASIC METABOLIC PANEL
BUN: 22 mg/dL (ref 6–23)
CALCIUM: 9 mg/dL (ref 8.4–10.5)
CHLORIDE: 101 meq/L (ref 96–112)
CO2: 29 meq/L (ref 19–32)
CREATININE: 0.93 mg/dL (ref 0.50–1.10)
GFR calc Af Amer: 68 mL/min — ABNORMAL LOW (ref >90)
GFR calc non Af Amer: 59 mL/min — ABNORMAL LOW (ref >90)
GLUCOSE: 133 mg/dL — AB (ref 70–99)
Potassium: 4.2 mEq/L (ref 3.7–5.3)
Sodium: 141 mEq/L (ref 137–147)

## 2013-11-20 LAB — GLUCOSE, CAPILLARY
GLUCOSE-CAPILLARY: 79 mg/dL (ref 70–99)
Glucose-Capillary: 132 mg/dL — ABNORMAL HIGH (ref 70–99)
Glucose-Capillary: 177 mg/dL — ABNORMAL HIGH (ref 70–99)
Glucose-Capillary: 232 mg/dL — ABNORMAL HIGH (ref 70–99)
Glucose-Capillary: 68 mg/dL — ABNORMAL LOW (ref 70–99)
Glucose-Capillary: 69 mg/dL — ABNORMAL LOW (ref 70–99)
Glucose-Capillary: 86 mg/dL (ref 70–99)
Glucose-Capillary: 96 mg/dL (ref 70–99)

## 2013-11-20 LAB — AMMONIA: AMMONIA: 29 umol/L (ref 11–60)

## 2013-11-20 LAB — PROTIME-INR
INR: 1.91 — AB (ref 0.00–1.49)
PROTHROMBIN TIME: 21.3 s — AB (ref 11.6–15.2)

## 2013-11-20 MED ORDER — FERROUS FUMARATE 325 (106 FE) MG PO TABS
1.0000 | ORAL_TABLET | Freq: Every day | ORAL | Status: DC
  Administered 2013-11-21 – 2013-11-23 (×3): 106 mg via ORAL
  Filled 2013-11-20 (×4): qty 1

## 2013-11-20 MED ORDER — LEVOTHYROXINE SODIUM 175 MCG PO TABS
175.0000 ug | ORAL_TABLET | Freq: Every day | ORAL | Status: DC
  Administered 2013-11-21 – 2013-11-23 (×3): 175 ug via ORAL
  Filled 2013-11-20 (×4): qty 1

## 2013-11-20 MED ORDER — WARFARIN - PHARMACIST DOSING INPATIENT
Freq: Every day | Status: DC
  Administered 2013-11-23: 17:00:00

## 2013-11-20 MED ORDER — HEPARIN SODIUM (PORCINE) 5000 UNIT/ML IJ SOLN
5000.0000 [IU] | Freq: Three times a day (TID) | INTRAMUSCULAR | Status: DC
  Administered 2013-11-20 (×2): 5000 [IU] via SUBCUTANEOUS
  Filled 2013-11-20 (×4): qty 1

## 2013-11-20 MED ORDER — PREDNISONE 5 MG PO TABS
5.0000 mg | ORAL_TABLET | Freq: Every morning | ORAL | Status: DC
  Administered 2013-11-20 – 2013-11-23 (×4): 5 mg via ORAL
  Filled 2013-11-20 (×4): qty 1

## 2013-11-20 MED ORDER — ONDANSETRON HCL 4 MG PO TABS
4.0000 mg | ORAL_TABLET | Freq: Three times a day (TID) | ORAL | Status: DC
  Administered 2013-11-20 – 2013-11-23 (×8): 4 mg via ORAL
  Filled 2013-11-20 (×12): qty 1

## 2013-11-20 MED ORDER — PANTOPRAZOLE SODIUM 40 MG PO TBEC
40.0000 mg | DELAYED_RELEASE_TABLET | Freq: Every day | ORAL | Status: DC
  Administered 2013-11-20 – 2013-11-22 (×3): 40 mg via ORAL
  Filled 2013-11-20 (×3): qty 1

## 2013-11-20 MED ORDER — DOCUSATE SODIUM 100 MG PO CAPS
100.0000 mg | ORAL_CAPSULE | Freq: Every day | ORAL | Status: DC
  Administered 2013-11-20 – 2013-11-22 (×2): 100 mg via ORAL
  Filled 2013-11-20 (×3): qty 1

## 2013-11-20 MED ORDER — TACROLIMUS 1 MG PO CAPS
2.0000 mg | ORAL_CAPSULE | Freq: Every morning | ORAL | Status: DC
  Administered 2013-11-21 – 2013-11-23 (×3): 2 mg via ORAL
  Filled 2013-11-20 (×3): qty 2

## 2013-11-20 MED ORDER — PANTOPRAZOLE SODIUM 40 MG PO TBEC: 40.0000 mg | DELAYED_RELEASE_TABLET | Freq: Every day | ORAL | Status: DC

## 2013-11-20 MED ORDER — LEVOTHYROXINE SODIUM 100 MCG IV SOLR
88.0000 ug | Freq: Every day | INTRAVENOUS | Status: DC
  Administered 2013-11-20: 88 ug via INTRAVENOUS
  Filled 2013-11-20 (×2): qty 5

## 2013-11-20 MED ORDER — MYCOPHENOLATE SODIUM 180 MG PO TBEC
360.0000 mg | DELAYED_RELEASE_TABLET | Freq: Two times a day (BID) | ORAL | Status: DC
  Administered 2013-11-20 – 2013-11-23 (×6): 360 mg via ORAL
  Filled 2013-11-20 (×8): qty 2

## 2013-11-20 MED ORDER — TAMSULOSIN HCL 0.4 MG PO CAPS
0.4000 mg | ORAL_CAPSULE | Freq: Every day | ORAL | Status: DC
  Administered 2013-11-20 – 2013-11-23 (×4): 0.4 mg via ORAL
  Filled 2013-11-20 (×4): qty 1

## 2013-11-20 MED ORDER — TACROLIMUS 1 MG PO CAPS
1.0000 mg | ORAL_CAPSULE | Freq: Every evening | ORAL | Status: DC
  Administered 2013-11-20 – 2013-11-22 (×3): 1 mg via ORAL
  Filled 2013-11-20 (×4): qty 1

## 2013-11-20 MED ORDER — WARFARIN SODIUM 4 MG PO TABS
4.0000 mg | ORAL_TABLET | Freq: Once | ORAL | Status: AC
  Administered 2013-11-20: 4 mg via ORAL
  Filled 2013-11-20: qty 1

## 2013-11-20 MED ORDER — HYDROCODONE-ACETAMINOPHEN 5-325 MG PO TABS
2.0000 | ORAL_TABLET | Freq: Two times a day (BID) | ORAL | Status: DC | PRN
  Administered 2013-11-20: 1 via ORAL
  Filled 2013-11-20: qty 1

## 2013-11-20 MED ORDER — ONDANSETRON HCL 4 MG/2ML IJ SOLN
4.0000 mg | Freq: Four times a day (QID) | INTRAMUSCULAR | Status: DC
  Administered 2013-11-20: 4 mg via INTRAVENOUS
  Filled 2013-11-20: qty 2

## 2013-11-20 MED ORDER — TACROLIMUS 1 MG PO CAPS: 1.0000 mg | ORAL_CAPSULE | Freq: Two times a day (BID) | ORAL | Status: DC

## 2013-11-20 MED ORDER — ATORVASTATIN CALCIUM 10 MG PO TABS
10.0000 mg | ORAL_TABLET | ORAL | Status: DC
  Administered 2013-11-20 – 2013-11-22 (×2): 10 mg via ORAL
  Filled 2013-11-20 (×2): qty 1

## 2013-11-20 MED ORDER — ESCITALOPRAM OXALATE 10 MG PO TABS
10.0000 mg | ORAL_TABLET | Freq: Every day | ORAL | Status: DC
  Administered 2013-11-20 – 2013-11-23 (×4): 10 mg via ORAL
  Filled 2013-11-20 (×4): qty 1

## 2013-11-20 MED ORDER — DEXTROSE 50 % IV SOLN
INTRAVENOUS | Status: AC
  Administered 2013-11-20: 50 mL
  Filled 2013-11-20: qty 50

## 2013-11-20 MED ORDER — LORAZEPAM 2 MG/ML IJ SOLN
1.0000 mg | Freq: Once | INTRAMUSCULAR | Status: DC
  Filled 2013-11-20: qty 1

## 2013-11-20 NOTE — Evaluation (Signed)
Clinical/Bedside Swallow Evaluation Patient Details  Name: Elizabeth [name] MRN: [id number] Date of Birth: 04/02/1941  Today's Date: 11/20/2013 Time: 1154-1208 SLP Time Calculation (min): 14 min  Past Medical History:  Past Medical History  Diagnosis Date  . Coronary artery disease   . Chronic kidney disease     s/p cadaveric renal transplant in 2001  . Diabetes mellitus   . PVD (peripheral vascular disease)   . History of orthostatic hypotension   . Hyperlipidemia   . Hypothyroidism   . Anemia   . Pancreatic cyst   . Osteoporosis     recurrent fractures  . Melanoma   . Carotid bruit     left  . Adenomatous polyp   . GERD (gastroesophageal reflux disease)   . Gastritis   . CVA (cerebral infarction)   . Bradycardia   . Renal artery stenosis   . Diabetic retinopathy   . Renal osteodystrophy   . Hyperparathyroidism   . Leg ulcer     from poor fitting prosthetic  . H/O immunosuppressive therapy   . 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 Fx wrist   . Fx ankle   . Neuropathy   . Nephropathy   . Carotid artery stenosis     mild  . Stroke   . Tubular adenoma   . Colitis   . IBS (irritable bowel syndrome)   . Ischemic colitis   . 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 Esophageal candidiasis   . 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 Fracture of right maxilla 09/21/2012  . Right orbit fracture 09/21/2012  . TBI (traumatic brain injury) 09/30/2012  . Zygoma fracture 09/21/2012  . Compression fracture of L4 lumbar vertebra   . Pulmonary embolism    Past Surgical History:  Past Surgical History  Procedure Laterality Date  . Kidney transplant  08/15/2000    [facility] in [place]  . 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 Appendectomy    . Cataract extraction    . Cholecystectomy    . Abdominal hysterectomy    . Thyroid surgery     . Tonsillectomy    . Cesarean section      x 2  . Vitrectomy      right eye  . Coronary artery bypass graft    . Colonoscopy w/ biopsies and polypectomy  02/18/2009    adenomatous polyps, diverticulosis, internal hemorrhoids  . Eus  02/04/2010    w/FNA, pancreatic cyst  . Upper gastrointestinal endoscopy  12/01/2009  . Colonoscopy  01/30/2012    Procedure: COLONOSCOPY;  Surgeon: Milus Banister, MD;  Location: [facility];  Service: Endoscopy;  Laterality: N/A;  . Esophagogastroduodenoscopy  05/02/2012    Procedure: ESOPHAGOGASTRODUODENOSCOPY (EGD);  Surgeon: Gatha Mayer, MD;  Location: [facility];  Service: Endoscopy;  Laterality: N/A;   HPI:  73 y.o. female presenting with sudden mental status change with concern for acute stroke . PMH is significant for stroke, Esophageal candidiasis, gastritis, DM2, hyperlipidemia, HTN, CAD s/p CABG, renal transplant, A. fib on anticoagulation, irritable bowel syndrome, GERD, diastolic CHF. CT Remote right parietal -occipital lobe infarct with encephalomalacia. Remote left cerebellar infarct with encephalomalacia. Remote small right cerebellar infarct. MRI No definite evidence of acute infarct. Hypoxia/Desaturation per nursing started shortly after vomiting, some concern for aspiration, thereforer ST consulted  to assess swallow function.       Assessment / Plan / Recommendation Clinical Impression  Pt. demonstrated subtle signs of possible pharyngeal defiict including red/watery eyes after swallow and decreased laryngeal elevation during swallow and audible swallow sometimes indicative of dysfunction.  SLP educated pt. and spouse to swallow strategies to decrease risk. SLP recommends regular texture diet and thin liquids, small straw sips okay and pills with water (if large, whole in applesauce).  SLP will folllow up next date.    Aspiration Risk  Moderate    Diet Recommendation Regular;Thin liquid   Liquid Administration via: Cup;Straw Medication  Administration: Whole meds with liquid Supervision: Patient able to self feed;Full supervision/cueing for compensatory strategies Compensations: Slow rate;Small sips/bites Postural Changes and/or Swallow Maneuvers: Seated upright 90 degrees    Other  Recommendations Oral Care Recommendations: Oral care BID   Follow Up Recommendations  None    Frequency and Duration min 1 x/week  1 week   Pertinent Vitals/Pain WDL      Swallow Study Prior Functional Status  Type of Home: House Available Help at Discharge: Family;Available 24 hours/day    General HPI: 73 y.o. female presenting with sudden mental status change with concern for acute stroke . PMH is significant for stroke, Esophageal candidiasis, gastritis, DM2, hyperlipidemia, HTN, CAD s/p CABG, renal transplant, A. fib on anticoagulation, irritable bowel syndrome, GERD, diastolic CHF. CT Remote right parietal -occipital lobe infarct with encephalomalacia. Remote left cerebellar infarct with encephalomalacia. Remote small right cerebellar infarct. MRI No definite evidence of acute infarct. Hypoxia/Desaturation per nursing started shortly after vomiting, some concern for aspiration, thereforer ST consulted to assess swallow function.     Type of Study: Bedside swallow evaluation Previous Swallow Assessment:  (none) Diet Prior to this Study: NPO Temperature Spikes Noted: No Respiratory Status: Room air History of Recent Intubation: No Behavior/Cognition: Alert;Cooperative;Requires cueing Oral Cavity - Dentition: Adequate natural dentition Self-Feeding Abilities: Able to feed self;Needs set up;Needs assist Patient Positioning: Upright in chair Baseline Vocal Quality: Low vocal intensity;Clear Volitional Cough: Weak Volitional Swallow: Able to elicit    Oral/Motor/Sensory Function Overall Oral Motor/Sensory Function: Appears within functional limits for tasks assessed   Ice Chips Ice chips: Not tested   Thin Liquid Thin Liquid:  Impaired Presentation: Cup;Straw Pharyngeal  Phase Impairments: Decreased hyoid-laryngeal movement (red, watery eyes)    Nectar Thick Nectar Thick Liquid: Not tested   Honey Thick Honey Thick Liquid: Not tested   Puree Puree: Impaired Pharyngeal Phase Impairments: Decreased hyoid-laryngeal movement   Solid   GO    Solid: Within functional limits       Orbie Pyo [employer] [phone]  11/20/2013

## 2013-11-20 NOTE — Progress Notes (Signed)
Patient with baseline confusion, yelling she was pain from her bladder. Patient was due to void after foley d/c 0400am. Bladder scan showed only 337 cc but patient insisted to be drained because she is pain. She was therefore straight cath and output of 300 cc was obtained. Will continue to monitor.

## 2013-11-20 NOTE — Progress Notes (Signed)
ANTICOAGULATION CONSULT NOTE - Initial Consult  Pharmacy Consult:  Coumadin Indication: atrial fibrillation  Allergies  Allergen Reactions  . Erythromycin Nausea And Vomiting  . Fentanyl Nausea And Vomiting  . Percocet [Oxycodone-Acetaminophen] Nausea And Vomiting    Patient Measurements: Height: 5\' 3"  (160 cm) Weight: 128 lb (58.06 kg) IBW/kg (Calculated) : 52.4  Vital Signs: Temp: 98.4 F (36.9 C) (04/01 1355) Temp src: Oral (04/01 1355) BP: 149/46 mmHg (04/01 1355) Pulse Rate: 53 (04/01 1355)  Labs:  Recent Labs  11/18/13  11/19/13 1147 11/19/13 1154 11/20/13 0043 11/20/13 1055  HGB  --   < > 14.4 15.0 12.4  --   HCT  --   --  44.9 44.0 38.7  --   PLT  --   --  129*  --  124*  --   APTT  --   --  36  --   --   --   LABPROT  --   --  20.2*  --  21.3*  --   INR 1.8  --  1.78*  --  1.91*  --   CREATININE  --   --  1.00 1.20* 0.93  --   TROPONINI  --   --   --   --   --  <0.30  < > = values in this interval not displayed.  Estimated Creatinine Clearance: 43.9 ml/min (by C-G formula based on Cr of 0.93).   Medical History: Past Medical History  Diagnosis Date  . Coronary artery disease   . Chronic kidney disease     s/p cadaveric renal transplant in 2001  . Diabetes mellitus   . PVD (peripheral vascular disease)   . History of orthostatic hypotension   . Hyperlipidemia   . Hypothyroidism   . Anemia   . Pancreatic cyst   . Osteoporosis     recurrent fractures  . Melanoma   . Carotid bruit     left  . Adenomatous polyp   . GERD (gastroesophageal reflux disease)   . Gastritis   . CVA (cerebral infarction)   . Bradycardia   . Renal artery stenosis   . Diabetic retinopathy   . Renal osteodystrophy   . Hyperparathyroidism   . Leg ulcer     from poor fitting prosthetic  . H/O immunosuppressive therapy   . 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 Fx wrist   . Fx ankle   . Neuropathy   . Nephropathy   . Carotid artery stenosis     mild   . Stroke   . Tubular adenoma   . Colitis   . IBS (irritable bowel syndrome)   . Ischemic colitis   . 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 Esophageal candidiasis   . 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 Fracture of right maxilla 09/21/2012  . Right orbit fracture 09/21/2012  . TBI (traumatic brain injury) 09/30/2012  . Zygoma fracture 09/21/2012  . Compression fracture of L4 lumbar vertebra   . Pulmonary embolism       Assessment: 49 YOF with history of Afib admitted with sudden AMS.  Patient now cleared to resume Coumadin.  Aware patient did not receive Coumadin last night and she is thrombocytopenic.  INR sub-therapeutic.  No bleeding documented.   Goal of Therapy:  INR 2-3 Monitor platelets by anticoagulation protocol: Yes    Plan:  -  Coumadin 4mg  PO today - Daily PT / INR - Change PPI to PO d/t national shortage    Miciah Covelli D. Mina Marble, PharmD, BCPS Pager:  [phone] 11/20/2013, 2:31 PM

## 2013-11-20 NOTE — Evaluation (Signed)
Physical Therapy Evaluation Patient Details Name: Elizabeth [name] MRN: [id number] DOB: 1940/12/20 Today's Date: 11/20/2013   History of Present Illness  Elizabeth [name] is a 73 y.o. female presenting with sudden mental status change with concern for acute stroke . PMH is significant for stroke, DM2, hyperlipidemia, HTN, CAD s/p CABG, renal transplant, A. fib on anticoagulation, irritable bowel syndrome, GERD, diastolic CHF.  Possible acute CVA.  Clinical Impression  Patient demonstrates deficits in mobility as listed below. Will need continued skilled PT to address deficits and maximize function. Will see as indicated and progress as tolerated.    Follow Up Recommendations No PT follow up;Supervision/Assistance - 24 hour    Equipment Recommendations  None recommended by PT    Recommendations for Other Services       Precautions / Restrictions Precautions Precautions: Fall Required Braces or Orthoses: Other Brace/Splint Other Brace/Splint: LLE prosthetic, RLE molded KAFO with modifications Restrictions Weight Bearing Restrictions: No      Mobility  Bed Mobility Overal bed mobility: Needs Assistance Bed Mobility: Rolling;Supine to Sit Rolling: Min assist   Supine to sit: Mod assist     General bed mobility comments: Assist to elevate trunk and pull to sitting position using rail and therapist.  Transfers Overall transfer level: Needs assistance Equipment used: 1 person hand held assist Transfers: Sit to/from [facility] Sit to Stand: Max assist Stand pivot transfers: Mod assist       General transfer comment: Assist for stability to come to standing, face to face transfer, patient support herself on therapist while standing fully upright, therapist assisting weight shifts so that patient could advance LEs during pivotal steps.  Patient able to advance legs.    Ambulation/Gait                Stairs            Wheelchair Mobility     Modified Rankin (Stroke Patients Only) Modified Rankin (Stroke Patients Only) Pre-Morbid Rankin Score: Moderately severe disability (?) Modified Rankin: Severe disability     Balance Overall balance assessment: Needs assistance Sitting-balance support: Feet supported (prosthetic on left brace on right) Sitting balance-Leahy Scale: Poor Sitting balance - Comments: min assist to support sitting Postural control: Posterior lean Standing balance support: During functional activity Standing balance-Leahy Scale: Poor Standing balance comment: mod to max assist for stability                     Pertinent Vitals/Pain Patient reports stomach pain and nausea    Home Living Family/patient expects to be discharged to:: Private residence Living Arrangements: Spouse/significant other Available Help at Discharge: Family;Available 24 hours/day Type of Home: House Home Access: Stairs to enter Entrance Stairs-Rails: None Entrance Stairs-Number of Steps: 1 Home Layout: Two level Home Equipment: Walker - 2 wheels;Bedside commode;Shower seat;Grab bars - toilet;Grab bars - tub/shower;Wheelchair - manual Additional Comments: patient poor historian question reliability or responses    Prior Function Level of Independence: Needs assistance   Gait / Transfers Assistance Needed: pt reports husband helps her with transfers and donning/doffing leg braces at baseline.  Unsure of accuracy as chart review states patient was independent with transfers.           Hand Dominance   Dominant Hand: Right    Extremity/Trunk Assessment   Upper Extremity Assessment: Defer to OT evaluation           Lower Extremity Assessment: Generalized weakness;RLE deficits/detail;LLE deficits/detail RLE Deficits / Details: RLE deformity  with bracing for mobility LLE Deficits / Details: LLE BKA with prosthetic     Communication   Communication: No difficulties  Cognition Arousal/Alertness:  Awake/alert Behavior During Therapy: Anxious Overall Cognitive Status: Impaired/Different from baseline Area of Impairment: Memory;Awareness;Problem solving     Memory: Decreased short-term memory     Awareness: Emergent (need to put braces on) Problem Solving: Slow processing      General Comments      Exercises        Assessment/Plan    PT Assessment Patient needs continued PT services  PT Diagnosis Altered mental status   PT Problem List Decreased strength;Decreased activity tolerance;Decreased balance;Decreased mobility;Pain  PT Treatment Interventions DME instruction;Gait training;Functional mobility training;Therapeutic activities;Therapeutic exercise;Balance training;Patient/family education   PT Goals (Current goals can be found in the Care Plan section) Acute Rehab PT Goals Patient Stated Goal: to not have stomach pain PT Goal Formulation: With patient Time For Goal Achievement: 12/04/13 Potential to Achieve Goals: Good    Frequency Min 3X/week   Barriers to discharge        End of Session Equipment Utilized During Treatment: Gait belt Activity Tolerance: Patient limited by fatigue Patient left: in chair;with call bell/phone within reach;with chair alarm set         Time: 8588-5027 PT Time Calculation (min): 28 min   Charges:   PT Evaluation $Initial PT Evaluation Tier I: 1 Procedure PT Treatments $Therapeutic Activity: 8-22 mins $Self Care/Home Management: 8-22   PT G CodesDuncan Dull 11/20/2013, 11:05 AM Alben Deeds, PT DPT  [phone]

## 2013-11-20 NOTE — Progress Notes (Signed)
Attempted carotid duplex, however patient would not sit still and was complaining of being nauseous. We will attempt at another time when patient is more cooperative.  11/20/2013 10:08 AM Maudry Mayhew, RVT, RDCS, RDMS

## 2013-11-20 NOTE — Consult Note (Addendum)
WOC wound consult note Reason for Consult: Consult requested for sacrum and right heel wound.  Husband at bedside is very well-informed regarding topical treatment.  Pt wears compression stocking and leg brace to RLE. States he has home health assistance and also he changes dressings sometimes. Wound type: Sacrum with stage 2 wound, .3X.1X.1cm, 100% red and moist, no odor, small amt pink drainage. Right heel with chronic stage 3 wound; 1X.5X.2cm, dry yellow wound bed, no odor, small amt yellow drainage. Right posterior leg with 2 abrasion wounds, 1X1X.1cm partial thickness, no odor, small amt pink drainage.  1X1cm dark purple bruise located next to the other site on posterior leg. Pressure Ulcer POA: Yes Dressing procedure/placement/frequency: Continue present plan of care with hydrocolloid to sacrum and right posterior leg to protect and promote healing, and Mepitel and foam dressing to right heel. Both sites should be changed 2X week. Float heel in Prevalon boot at night when she is not wearing the stocking and leg brace to reduce pressure.  Please re-consult if further assistance is needed.  Thank-you,  Julien Girt MSN, [place], [place], [place], [place]

## 2013-11-20 NOTE — Progress Notes (Signed)
Hypoglycemic Event  CBG: 69  Treatment: D50 IV 25 mL  Symptoms: None  Follow-up CBG: Time:0046 CBG Result:132  Possible Reasons for Event: Vomiting and Inadequate meal intake  Comments/MD notified:Dr. Wendi Snipes made aware, no orders given.    Elizabeth [name] [name]  Remember to initiate Hypoglycemia Order Set & complete

## 2013-11-20 NOTE — Progress Notes (Signed)
Echocardiogram 2D Echocardiogram has been performed.  Elizabeth [name] 11/20/2013, 1:02 PM

## 2013-11-20 NOTE — Progress Notes (Signed)
Family Medicine Teaching Service Daily Progress Note Intern Pager: [phone]  Patient name: Elizabeth [name] Medical record number: [id number] Date of birth: 1941-03-12 Age: 73 y.o. Gender: female  Primary Care Provider: Donetta Potts, MD Consultants: neurology Code Status: full  Pt Overview and Major Events to Date:  3/31: AMS, likely stroke  Assessment and Plan: Elizabeth [name] is a 73 y.o. female presenting with sudden mental status change with concern for acute stroke . PMH is significant for stroke, DM2, hyperlipidemia, HTN, CAD s/p CABG, renal transplant, A. fib on anticoagulation, irritable bowel syndrome, GERD, diastolic CHF.   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 Other etiology possibly include infectious (UA and CXR clean), medication-induced (no new meds), or metabolic. salicylate & ammonia: wnl - CT head old infarct in right parietal and left cerebellar areas, small vessel disease changes, and question of basilar artery thrombosis  - Lipid panel (Tchol 118, LDL 39) - Holding Lipitor due to NPO - SLP/PT/OT eval  - ASA rectal - Neurology consulted   MRI/MRA: Repeat due to motion artifact & unable to be completed due to mental status   Carotid dopplers & TTE: ordered   Hypoxia/Desaturation  - Per nursing started shortly after vomiting, some concern for aspiration  - Will keep n.p.o. until swallow cleared  - Current on 3L of O2; Not on O2 prior - CXR: ordered  DM2  - A1c: 6.2;  09/21/2012 6.3  - Attenuated to 6 units daily, hold home sliding scale, Q4 sliding scale here   CKD s/p renal transplant  - Creatinine 0.93 < 1.2 (3/31) - Holding antirejection meds due to NPO: Tacrolimus, prednisone, mycophenolate  - Nephrology informed and will see today  CAD, S/P Single vessel CABG in 2001, Hx of Afib  - ASA   - Dyspnea w/o chest pain  - EKG with slightly more prominent Q waves in V1/V2 on admission - tele: Continues to have sinus brady - toponins x 3 ordered  Diastolic CHF  - Appears to be overall euvolemic, weight less than her goal dry weight  - Will be evaluated with TTE during this admission   - Not currently taking diuretics, prescribed by cardiology for weight over 136 so will hold with weight 128 on admission  HTN - well controlled now w/o meds - Initially elevated here as high as 212/65  DM2  - A1c: 6.2;  09/21/2012 6.3  - Attenuated to 6 units daily, hold home sliding scale, Q4 sliding scale here   IBS - holding hyoscyamine GERD- IV PPI  Hypothyroidism - continue Synthroid IV 26mcg ( PO dose 129mcg)   FEN/GI: n.p.o. for now, NS @ 75cc/hr Prophylaxis: heparin   Disposition: Admit to telemetry for stroke workup and close monitoring  Subjective:  Slightly confused this morning. No complaints. Says she's in the hospital due to infection.   Objective: Temp:  [97.3 F (36.3 C)-99.2 F (37.3 C)] 98.6 F (37 C) (04/01 0405) Pulse Rate:  [52-93] 53 (04/01 0405) Resp:  [16-24] 18 (04/01 0405) BP: (138-212)/(45-78) 138/60 mmHg (04/01 0405) SpO2:  [53 %-100 %] 98 % (04/01 0405) Weight:  [128 lb (58.06 kg)] 128 lb (58.06 kg) (03/31 1209) Physical Exam: Gen: NAD, alert, cooperative with exam  Neuro: alert and oriented to person but not place/time, limited by mental status, strength 5/5 b/l UE, sensation intact in bilateral upper and  right lower extremity, speech clear but limited HEENT: NCAT,PERRL, EOMI, MMM  CV: Distant heart sounds, RRR  Resp: Breath sounds clear but diminished throughout Abd: Soft , mild tenderness throughout to palpation, no guarding  Ext: 2+ pitting edema on right lower extremity, BKA on the left, right lower leg with 2 large bony calluses about halfway down from the knee   Laboratory:  Recent Labs Lab 11/19/13 1147 11/19/13 1154 11/20/13 0043  WBC  6.5  --  5.3  HGB 14.4 15.0 12.4  HCT 44.9 44.0 38.7  PLT 129*  --  124*    Recent Labs Lab 11/19/13 1147 11/19/13 1154 11/20/13 0043  NA 142 140 141  K 4.8 4.5 4.2  CL 101 97 101  CO2 34*  --  29  BUN 27* 27* 22  CREATININE 1.00 1.20* 0.93  CALCIUM 10.0  --  9.0  PROT 6.0  --   --   BILITOT 0.5  --   --   ALKPHOS 67  --   --   ALT 18  --   --   AST 21  --   --   GLUCOSE 174* 167* 133*    Recent Labs Lab 11/18/13 11/19/13 1147 11/20/13 0043  INR 1.8 1.78* 1.91*   Urinalysis    Component Value Date/Time   COLORURINE YELLOW 11/19/2013 1310   APPEARANCEUR TURBID* 11/19/2013 1310   LABSPEC 1.015 11/19/2013 1310   PHURINE 8.0 11/19/2013 1310   GLUCOSEU NEGATIVE 11/19/2013 1310   HGBUR NEGATIVE 11/19/2013 1310   BILIRUBINUR NEGATIVE 11/19/2013 1310   KETONESUR NEGATIVE 11/19/2013 1310   PROTEINUR NEGATIVE 11/19/2013 1310   UROBILINOGEN 0.2 11/19/2013 1310   NITRITE NEGATIVE 11/19/2013 1310   LEUKOCYTESUR NEGATIVE 11/19/2013 1310   Imaging/Diagnostic Tests: CT head 11/19/2013  IMPRESSION:  Remote right parietal -occipital lobe infarct with encephalomalacia.  Remote left cerebellar infarct with encephalomalacia. Remote small  right cerebellar infarct.  Small vessel disease type changes.  The basilar artery is slightly dense, more so than on prior exams.  Basilar artery thrombosis cannot be excluded in the proper clinical  setting.  Atrophy.  Ventricular prominence possibly related to central atrophy although  cannot exclude hydrocephalus. This is without change.  Prominent vascular calcifications.   MRI 11/19/2013 - incomplete images do to patient's middle status, collected images were motion degradated   EKG 11/19/2013: Sinus bradycardia Q waves in V1 and V2 Q waves slightly more prominent than last 1 month ago)  Phill Myron, MD 11/20/2013, 8:54 AM PGY-1, [place] Intern pager: [phone], text pages welcome

## 2013-11-20 NOTE — Progress Notes (Signed)
[facility]  Patient Status: Active (receiving services up to time of hospitalization)  [facility] is providing the following services: RN - wound care  If patient discharges after hours, please call [phone].   Elizabeth [name] 11/20/2013, 8:25 PM

## 2013-11-20 NOTE — Progress Notes (Signed)
Attending Addendum  I examined the patient and discussed the assessment and plan with Dr. Berkley Harvey. I have reviewed the note and agree. Patient improved today she is not complaining of nausea. She is alert in no distress. Able to raise both arms w/o difficulty. Back to baseline per her husband.  Presentation concerning for TIA vs new stroke. Plan for repat MRI/MRA, carotid dopplers, CXR.  Dispo planning. Anticipate d/c to home with home health given reported difficulty at last SNF with recurrent UTI. Anticipate d/c in 1-2 days.   Elizabeth [name], Elizabeth [name]

## 2013-11-20 NOTE — Evaluation (Signed)
Occupational Therapy Evaluation Patient Details Name: Elizabeth [name] MRN: [id number] DOB: 09-Nov-1940 Today's Date: 11/20/2013    History of Present Illness Elizabeth [name] is a 73 y.o. female presenting with sudden mental status change with concern for acute stroke . PMH is significant for stroke, DM2, hyperlipidemia, HTN, CAD s/p CABG, renal transplant, A. fib on anticoagulation, irritable bowel syndrome, GERD, diastolic CHF.  Possible acute CVA.   Clinical Impression   Pt was seen for initial evaluation.  She will benefit from skilled OT to increase safety and independence with adls and mobility to promote safety with adls.  At time of eval, no family present to determine baseline. Pt was admitted with AMS.  Will follow in acute.      Follow Up Recommendations  Supervision/Assistance - 24 hour;SNF (vs, depending upon progress/family's ability to manage)    Equipment Recommendations   (pt states she has a 3:1)    Recommendations for Other Services       Precautions / Restrictions Precautions Precautions: Fall Required Braces or Orthoses: Other Brace/Splint Other Brace/Splint: LLE prosthetic, RLE molded KAFO with modifications Restrictions Weight Bearing Restrictions: No      Mobility Bed Mobility Overal bed mobility: Needs Assistance Bed Mobility: Rolling;Supine to Sit Rolling: Min assist   Supine to sit: Mod assist     General bed mobility comments: Assist to elevate trunk and pull to sitting position using rail and therapist.  Transfers  Did not stand with pt: Prior to OT, pt stood with max A and was mod A for SPT with PT              Balance                            ADL Eating/Feeding: NPO Grooming: Wash/dry face;Brushing hair;Minimal assistance Upper Body Bathing Details (indicate cue type and reason): mod cues for thoroughness and sequence Upper Body Dressing : Moderate assistance;Sitting Lower Body Bathing: Total  assistance;Sitting/lateral leans Lower Body Dressing: Total assistance         General ADL Comments: Did not stand during OT eval:  pt had just finished with PT.  Pt tends to hold head to L, looking out window.  States vision is not good--? if she wears glasses.  Pt has prosthetic leg on L and brace on RLE.  Did not brush teeth as pt is NPO.  Pt needs cues for adls for sustaining attention to task and sequencing. (moderate, multimodal)     Vision                     Perception     Praxis      Pertinent Vitals/Pain C/o stomach pain but did not rate.       Hand Dominance Right   Extremity/Trunk Assessment Upper Extremity Assessment Upper Extremity Assessment: Generalized weakness (pt had difficulty manipulating washccloths to open)   Lower Extremity Assessment Lower Extremity Assessment: Generalized weakness;RLE deficits/detail;LLE deficits/detail RLE Deficits / Details: RLE deformity with bracing for mobility LLE Deficits / Details: LLE BKA with prosthetic       Communication Communication Communication: No difficulties   Cognition Arousal/Alertness: Awake/alert Behavior During Therapy: WFL for tasks assessed/performed Overall Cognitive Status: Impaired/Different from baseline Area of Impairment: Memory;Attention     Memory: Decreased short-term memory     Awareness: Emergent (need to put braces on) Problem Solving: Slow processing General Comments: pt performed small parts of adl tasks and  needed max cues to continue   General Comments       Exercises      Home Living Family/patient expects to be discharged to:: Private residence Living Arrangements: Spouse/significant other Available Help at Discharge: Family;Available 24 hours/day Type of Home: House Home Access: Stairs to enter [employer] of Steps: 1 Entrance Stairs-Rails: None Home Layout: Two level               Home Equipment: Walker - 2 wheels;Bedside commode;Shower  seat;Grab bars - toilet;Grab bars - tub/shower;Wheelchair - manual   Additional Comments: patient poor historian question reliability or responses      Prior Functioning/Environment Level of Independence: Needs assistance  Gait / Transfers Assistance Needed: pt reports husband helps her with transfers and donning/doffing leg braces at baseline.  Unsure of accuracy as chart review states patient was independent with transfers.     Comments: pt states husband helps her with adls    OT Diagnosis:     OT Problem List: Decreased strength;Decreased activity tolerance;Impaired balance (sitting and/or standing);Decreased coordination;Decreased cognition;Decreased safety awareness;Decreased knowledge of use of DME or AE;Pain   OT Treatment/Interventions: Self-care/ADL training;Therapeutic exercise;Therapeutic activities;Cognitive remediation/compensation;Patient/family education;Balance training    OT Goals(Current goals can be found in the care plan section) Acute Rehab OT Goals Patient Stated Goal: to not have stomach pain OT Goal Formulation: With patient Time For Goal Achievement: 12/04/13 Potential to Achieve Goals: Good ADL Goals Pt Will Transfer to Toilet: with mod assist;stand pivot transfer;bedside commode Additional ADL Goal #1: pt will complete UB adls and grooming with supervision and min cues sitting Additional ADL Goal #2: Pt will maintain static standing for adls with min A x 2 minutes with AD  OT Frequency: Min 2X/week   Barriers to D/C:            End of Session:    Activity Tolerance: Patient limited by fatigue Patient left: in chair;with call bell/phone within reach   Time: 1042-1101 OT Time Calculation (min): 19 min Charges:  OT General Charges $OT Visit: 1 Procedure OT Evaluation $Initial OT Evaluation Tier I: 1 Procedure OT Treatments $Self Care/Home Management : 8-22 mins G-Codes:    Milen Lengacher 12-04-2013, 11:24 AM  Lesle Chris,  OTR/L [phone] 12/04/2013

## 2013-11-20 NOTE — Progress Notes (Signed)
Hypoglycemic Event  CBG: 68  Treatment: 15 GM carbohydrate snack  Symptoms: None  Follow-up CBG: Time:96 CBG Result: 1329  Possible Reasons for Event: Other: Npo   Comments/MD notified:yes    [place], Darral Dash  Remember to initiate Hypoglycemia Order Set & complete

## 2013-11-20 NOTE — Progress Notes (Signed)
PT Cancellation Note  Patient Details Name: Elizabeth [name] MRN: [id number] DOB: 20-Mar-1941   Cancelled Treatment:    Reason Eval/Treat Not Completed: Patient at procedure or test/unavailable, Vascular study will follow up.   Duncan Dull 11/20/2013, 8:42 AM Alben Deeds, PT DPT  [phone]

## 2013-11-20 NOTE — Progress Notes (Signed)
NEURO HOSPITALIST PROGRESS NOTE   SUBJECTIVE:                                                                                                                        No complaints.  Is aware she is in the hospital. She is not sure why she is in the hospital but states her husband brought her here.   OBJECTIVE:                                                                                                                           Vital signs in last 24 hours: Temp:  [97.3 F (36.3 C)-99.2 F (37.3 C)] 98 F (36.7 C) (04/01 0958) Pulse Rate:  [50-93] 50 (04/01 0958) Resp:  [16-24] 16 (04/01 0958) BP: (138-212)/(45-116) 149/116 mmHg (04/01 0958) SpO2:  [53 %-100 %] 100 % (04/01 0958) Weight:  [58.06 kg (128 lb)] 58.06 kg (128 lb) (03/31 1209)  Intake/Output from previous day: 03/31 0701 - 04/01 0700 In: -  Out: 1300 [Urine:1300] Intake/Output this shift:   Nutritional status: NPO  Past Medical History  Diagnosis Date  . Coronary artery disease   . Chronic kidney disease     s/p cadaveric renal transplant in 2001  . Diabetes mellitus   . PVD (peripheral vascular disease)   . History of orthostatic hypotension   . Hyperlipidemia   . Hypothyroidism   . Anemia   . Pancreatic cyst   . Osteoporosis     recurrent fractures  . Melanoma   . Carotid bruit     left  . Adenomatous polyp   . GERD (gastroesophageal reflux disease)   . Gastritis   . CVA (cerebral infarction)   . Bradycardia   . Renal artery stenosis   . Diabetic retinopathy   . Renal osteodystrophy   . Hyperparathyroidism   . Leg ulcer     from poor fitting prosthetic  . H/O immunosuppressive therapy   . 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 Fx wrist   . Fx ankle   . Neuropathy   . Nephropathy   . Carotid artery stenosis     mild  . Stroke   . Tubular adenoma   .  Colitis   . IBS (irritable bowel syndrome)   . Ischemic colitis   . 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 Esophageal candidiasis   . 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 Fracture of right maxilla 09/21/2012  . Right orbit fracture 09/21/2012  . TBI (traumatic brain injury) 09/30/2012  . Zygoma fracture 09/21/2012  . Compression fracture of L4 lumbar vertebra   . Pulmonary embolism      Neurologic Exam:  Mental Status: Alert, oriented to hospital but not date or year. Speech fluent without evidence of aphasia.  Able to follow 3 step commands without difficulty.  Cranial Nerves: II: Visual fields shows a left hemianopia, pupils irregular bilaterally and minimally reactive to light and accommodation III,IV, VI: ptosis not present, extra-ocular motions intact bilaterally V,VII: smile symmetric, facial light touch sensation normal bilaterally VIII: hearing normal bilaterally IX,X: gag reflex present XI: bilateral shoulder shrug XII: midline tongue extension without atrophy or fasciculations  Motor: Right : Upper extremity   5/5    Left:     Upper extremity   5/5  Lower extremity   3/5     Lower extremity   3/5 Tone and bulk:normal tone throughout; no atrophy noted Sensory: Pinprick and light touch intact throughout,prosthetic left leg Deep Tendon Reflexes:  Right: Upper Extremity   Left: Upper extremity   biceps (C-5 to C-6) 2/4   biceps (C-5 to C-6) 2/4 tricep (C7) 2/4    triceps (C7) 2/4 Brachioradialis (C6) 2/4  Brachioradialis (C6) 2/4  Lower Extremity Lower Extremity  quadriceps (L-2 to L-4) 1/4   quadriceps (L-2 to L-4) 1/4 Achilles (S1) 0/4   Achilles (S1) prosthesis  Plantars: Right: mute   Left: prosthesis Cerebellar: normal finger-to-nose,   Gait: not tested CV: pulses palpable throughout    Lab Results: Basic Metabolic Panel:  Recent Labs Lab 11/19/13 1147 11/19/13 1154 11/20/13 0043  NA 142 140 141  K 4.8 4.5 4.2  CL 101 97 101  CO2 34*  --  29   GLUCOSE 174* 167* 133*  BUN 27* 27* 22  CREATININE 1.00 1.20* 0.93  CALCIUM 10.0  --  9.0    Liver Function Tests:  Recent Labs Lab 11/19/13 1147  AST 21  ALT 18  ALKPHOS 67  BILITOT 0.5  PROT 6.0  ALBUMIN 3.3*   No results found for this basename: LIPASE, AMYLASE,  in the last 168 hours  Recent Labs Lab 11/20/13 0043  AMMONIA 29    CBC:  Recent Labs Lab 11/19/13 1147 11/19/13 1154 11/20/13 0043  WBC 6.5  --  5.3  NEUTROABS 4.6  --   --   HGB 14.4 15.0 12.4  HCT 44.9 44.0 38.7  MCV 93.2  --  91.9  PLT 129*  --  124*    Cardiac Enzymes: No results found for this basename: CKTOTAL, CKMB, CKMBINDEX, TROPONINI,  in the last 168 hours  Lipid Panel:  Recent Labs Lab 11/20/13 0043  CHOL 118  TRIG 72  HDL 65  CHOLHDL 1.8  VLDL 14  LDLCALC 39    CBG:  Recent Labs Lab 11/19/13 1739 11/19/13 2021 11/20/13 0019 11/20/13 0043 11/20/13 0359  GLUCAP 126* 86 69* 132* 86    Microbiology: Results for orders placed during the hospital encounter of 09/28/12  URINE CULTURE     Status: None   Collection Time    10/04/12  6:23 PM  Result Value Ref Range Status   Specimen Description URINE, CATHETERIZED   Final   Special Requests PATIENT ON FOLLOWING CEFTIN   Final   Culture  Setup Time 10/04/2012 19:25   Final   Colony Count 50,000 COLONIES/ML   Final   Culture ENTEROCOCCUS SPECIES   Final   Report Status 10/06/2012 FINAL   Final   Organism ID, Bacteria ENTEROCOCCUS SPECIES   Final  CLOSTRIDIUM DIFFICILE BY PCR     Status: None   Collection Time    10/08/12 10:00 AM      Result Value Ref Range Status   C difficile by pcr NEGATIVE  NEGATIVE Final    Coagulation Studies:  Recent Labs  11/18/13 11/19/13 1147 11/20/13 0043  LABPROT  --  20.2* 21.3*  INR 1.8 1.78* 1.91*    Imaging: Ct Head Wo Contrast  11/19/2013   CLINICAL DATA:  Pain.  History of strokes.  EXAM: CT HEAD WITHOUT CONTRAST  TECHNIQUE: Contiguous axial images were obtained  from the base of the skull through the vertex without intravenous contrast.  COMPARISON:  10/04/2012 CT.  09/23/2012 MR.  FINDINGS: Remote right parietal -occipital lobe infarct with encephalomalacia.  Remote left cerebellar infarct with encephalomalacia. Remote small right cerebellar infarct.  Small vessel disease type changes.  The basilar artery is slightly dense, more so than on prior exams. Basilar artery thrombosis cannot be excluded in the proper clinical setting.  Atrophy.  Ventricular prominence possibly related to central atrophy although cannot exclude hydrocephalus. This is without change.  No intracranial mass lesion noted on this unenhanced exam.  Prominent vascular calcifications.  Partial opacification right sphenoid sinus air cell.  IMPRESSION: Remote right parietal -occipital lobe infarct with encephalomalacia.  Remote left cerebellar infarct with encephalomalacia. Remote small right cerebellar infarct.  Small vessel disease type changes.  The basilar artery is slightly dense, more so than on prior exams. Basilar artery thrombosis cannot be excluded in the proper clinical setting.  Atrophy.  Ventricular prominence possibly related to central atrophy although cannot exclude hydrocephalus. This is without change.  Prominent vascular calcifications.  These results were called by telephone at the time of interpretation on 11/19/2013 at 12:07 PM to Dr. Armida Sans , who verbally acknowledged these results.   Electronically Signed   By: Chauncey Cruel M.D.   On: 11/19/2013 12:12   Mr Brain Ltd W/o Cm  11/19/2013   CLINICAL DATA:  History of stroke. Dizziness and nausea and vomiting.  EXAM: MRI HEAD WITHOUT CONTRAST  TECHNIQUE: Multiplanar, multiecho pulse sequences of the brain and surrounding structures were obtained without intravenous contrast.  COMPARISON:  CT HEAD W/O CM dated 11/19/2013; MR HEAD W/O CM dated 09/23/2012  FINDINGS: Due to patient's mental status, the examination could not be completed. Only  axial diffusion weighted images were obtained, and these images are severely motion degraded.  Localizer images demonstrate encephalomalacia related to old left cerebellar and right parieto-occipital infarcts. Motion degraded diffusion-weighted images are without restricted diffusion seen to indicate acute infarct.  IMPRESSION: Incomplete examination as above. No definite evidence of acute infarct.   Electronically Signed   By: Logan Bores   On: 11/19/2013 12:59       MEDICATIONS  Scheduled: . aspirin  300 mg Rectal Daily   Or  . aspirin  325 mg Oral Daily  . heparin subcutaneous  5,000 Units Subcutaneous 3 times per day  . insulin aspart  0-9 Units Subcutaneous 6 times per day  . levothyroxine  88 mcg Intravenous QAC breakfast  . ondansetron (ZOFRAN) IV  4 mg Intravenous 4 times per day  . pantoprazole (PROTONIX) IV  40 mg Intravenous QHS    ASSESSMENT/PLAN:                                                                                                            72 y.o. female with acute onset altered mental status. No focal findings on neuro-exam. CT brain showed no acute ischemia or hemorrhage but concern of hyperdense tip of BA.  LDL 39. MRI/MRA pending. Echo and carotid doppler pending. INR 1.91 up from 1.78.   Recommend: 1) repeat MRI/MRA brain 2) Continue coumadin 3) A1c 4) PT/OT/SLP    Assessment and plan discussed with with attending physician and they are in agreement.    Etta Quill PA-C [facility] [phone]  11/20/2013, 11:39 AM

## 2013-11-21 ENCOUNTER — Inpatient Hospital Stay (HOSPITAL_COMMUNITY): Payer: Medicare Other

## 2013-11-21 DIAGNOSIS — I509 Heart failure, unspecified: Secondary | ICD-10-CM

## 2013-11-21 DIAGNOSIS — I5031 Acute diastolic (congestive) heart failure: Secondary | ICD-10-CM

## 2013-11-21 LAB — GLUCOSE, CAPILLARY
GLUCOSE-CAPILLARY: 146 mg/dL — AB (ref 70–99)
GLUCOSE-CAPILLARY: 84 mg/dL (ref 70–99)
Glucose-Capillary: 158 mg/dL — ABNORMAL HIGH (ref 70–99)
Glucose-Capillary: 161 mg/dL — ABNORMAL HIGH (ref 70–99)
Glucose-Capillary: 199 mg/dL — ABNORMAL HIGH (ref 70–99)
Glucose-Capillary: 206 mg/dL — ABNORMAL HIGH (ref 70–99)
Glucose-Capillary: 248 mg/dL — ABNORMAL HIGH (ref 70–99)

## 2013-11-21 LAB — PROTIME-INR
INR: 1.58 — ABNORMAL HIGH (ref 0.00–1.49)
Prothrombin Time: 18.4 seconds — ABNORMAL HIGH (ref 11.6–15.2)

## 2013-11-21 LAB — TROPONIN I: Troponin I: <0.3 ng/mL (ref <0.30)

## 2013-11-21 MED ORDER — WARFARIN SODIUM 7.5 MG PO TABS
7.5000 mg | ORAL_TABLET | Freq: Once | ORAL | Status: AC
  Administered 2013-11-21: 7.5 mg via ORAL
  Filled 2013-11-21: qty 1

## 2013-11-21 NOTE — Progress Notes (Signed)
ANTICOAGULATION CONSULT NOTE - FOLLOW UP  Pharmacy Consult:  Coumadin Indication: atrial fibrillation  Allergies  Allergen Reactions  . Erythromycin Nausea And Vomiting  . Fentanyl Nausea And Vomiting  . Percocet [Oxycodone-Acetaminophen] Nausea And Vomiting    Patient Measurements: Height: 5\' 3"  (160 cm) Weight: 128 lb (58.06 kg) IBW/kg (Calculated) : 52.4  Vital Signs: Temp: 98.1 F (36.7 C) (04/02 0948) Temp src: Oral (04/02 0948) BP: 143/40 mmHg (04/02 0948) Pulse Rate: 47 (04/02 0948)  Labs:  Recent Labs  11/19/13 1147 11/19/13 1154 11/20/13 0043 11/20/13 1055 11/20/13 1859 11/21/13 0158  HGB 14.4 15.0 12.4  --   --   --   HCT 44.9 44.0 38.7  --   --   --   PLT 129*  --  124*  --   --   --   APTT 36  --   --   --   --   --   LABPROT 20.2*  --  21.3*  --   --  18.4*  INR 1.78*  --  1.91*  --   --  1.58*  CREATININE 1.00 1.20* 0.93  --   --   --   TROPONINI  --   --   --  <0.30 <0.30 <0.30    Estimated Creatinine Clearance: 43.9 ml/min (by C-G formula based on Cr of 0.93).     Assessment: 84 YOF with history of Afib admitted with sudden AMS.  Patient now cleared to resume Coumadin.  INR sub-therapeutic and has trended down, likely d/t missed dose on 11/19/13.  No bleeding reported.    Goal of Therapy:  INR 2-3 Monitor platelets by anticoagulation protocol: Yes    Plan:   - Coumadin 7.5mg  PO today - Daily PT / INR - F/U decrease ASA dose once INR therapeutic    Leondra Cullin D. Mina Marble, PharmD, BCPS Pager:  [phone] 11/21/2013, 10:52 AM

## 2013-11-21 NOTE — Progress Notes (Signed)
Occupational Therapy Treatment Patient Details Name: Elizabeth [name] MRN: [id number] DOB: 1940-09-03 Today's Date: 11/21/2013    History of present illness Elizabeth [name] is a 73 y.o. female presenting with sudden mental status change with concern for acute stroke . PMH is significant for stroke, DM2, hyperlipidemia, HTN, CAD s/p CABG, renal transplant, A. fib on anticoagulation, irritable bowel syndrome, GERD, diastolic CHF.  Possible acute CVA.   OT comments  Pt's husband available to give info about PLOF.  Pt has decreased cognition at baseline with L field cut, L neglect, and L side incoordination.  She can self feed and groom in sitting, but he bathes, dresses and takes her to the bathroom.  Pt has been w/c bound x 1 year.  Husband plans to take pt home at discharge.  Follow Up Recommendations  No OT follow up;Supervision/Assistance - 24 hour    Equipment Recommendations  None recommended by OT    Recommendations for Other Services      Precautions / Restrictions Precautions Precautions: Fall Required Braces or Orthoses: Other Brace/Splint Other Brace/Splint: LLE prosthetic, RLE molded KAFO with modifications       Mobility Bed Mobility Overal bed mobility: Needs Assistance Bed Mobility: Rolling;Supine to Sit Rolling: Min assist   Supine to sit: Mod assist        Transfers Overall transfer level: Needs assistance   Transfers: Comptroller transfers: Total assist;+2 physical assistance   General transfer comment: unable to get adequate fit of prosthesis for safe stand pivot    Balance Overall balance assessment: Needs assistance   Sitting balance-Leahy Scale: Poor   Postural control: Posterior lean                         ADL Overall ADL's : Needs assistance/impaired     Grooming: Wash/dry hands;Wash/dry face;Brushing hair;Oral care;Set up;Sitting                   Toilet Transfer: +2 for  physical assistance;Anterior/posterior (to recliner) [employer] forces technical [employer] Details (indicate cue type and reason): unable to donn prosthesis adequately for stand pivot, used bedpan Toileting- Clothing Manipulation and Hygiene: Total assistance;Bed level         General ADL Comments: Pt is dependent in sponge bathing, dressing, and toileting at baseline.  She transfers only, has not ambulated in +1 year.  Only uses brace and prosthesis for transfers due to wound on R foot. Pt is well equipped at home.  Husband states he is able to handle her at home and does not wish to have rehab.      Vision                     Perception     Praxis      Cognition   Behavior During Therapy: Anxious Overall Cognitive Status: History of cognitive impairments - at baseline (per husband)                       Extremity/Trunk Assessment               Exercises     Shoulder Instructions       General Comments      Pertinent Vitals/ Pain       No pain, VSS  Home Living  Prior Functioning/Environment              Frequency       Progress Toward Goals  OT Goals(current goals can now be found in the care plan section)  Progress towards OT goals: Goals met and updated - see care plan     Plan Discharge plan needs to be updated    Co-evaluation                 End of Session     Activity Tolerance Patient tolerated treatment well   Patient Left in chair;with call bell/phone within reach;with chair alarm set   Nurse Communication Mobility status;Other (comment) (PLOF)        Time: 7408-1448 OT Time Calculation (min): 38 min  Charges: OT General Charges $OT Visit: 1 Procedure OT Treatments $Self Care/Home Management : 23-37 mins  Malka So 11/21/2013, 12:41 PM [phone]

## 2013-11-21 NOTE — Progress Notes (Signed)
Family Medicine Teaching Service Daily Progress Note Intern Pager: [phone]  Patient name: Elizabeth [name] Medical record number: [id number] Date of birth: 1941/06/29 Age: 73 y.o. Gender: female  Primary Care Provider: Donetta Potts, MD Consultants: neurology Code Status: full  Pt Overview and Major Events to Date:  3/31: AMS, possible stroke 4/1: Mental status improved; awaiting initial imaging to be completed; consider calling her Cardiologist  Assessment and Plan: Elizabeth [name] is a 73 y.o. female presenting with sudden mental status change with concern for acute stroke . PMH is significant for stroke, DM2, hyperlipidemia, HTN, CAD s/p CABG, renal transplant, A. fib on anticoagulation, irritable bowel syndrome, GERD, diastolic CHF.   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 Other etiology possibly include infectious (UA and CXR clean), medication-induced (no new meds), Cardiac (see below) or metabolic. salicylate & ammonia: wnl - CT head old infarct in right parietal and left cerebellar areas, small vessel disease changes, and question of basilar artery thrombosis  - Lipid panel (Tchol 118, LDL 39) - Holding Lipitor due to NPO - ASA  - SLP: reg/thin liquid - OT: 24 hrs assistance vs SNF, PT: 24 hrs assistance  - Neurology consulted   MRI/MRA: Ordered (Repeat due to motion artifact & unable to be completed due to mental status)- Ativan ordered once prior to imaging  Carotid dopplers: ordered (unable to obtain 4/1 due to pt participation)  TTE: EF 65-70 %; Grade 2 diastolic dysfcn, Mild AV regurg, Mod MV & TV regurg  Hypoxia/Desaturation  - Per nursing started shortly after vomiting, some concern for aspiration  - CXR: Bilateral pleural effusions and bibasilar atelectasis - Off oxygen with good staturation - Cleared  by SLP to eat  DM2  - A1c: 6.2;  09/21/2012 6.3  - Attenuated to 6 units daily, hold home sliding scale, Q4 sliding scale here   CKD s/p renal transplant  - Creatinine 0.93 < 1.2 (3/31) - Restarted antirejection meds 4/1 after cleared by SLP: Tacrolimus, prednisone, mycophenolate  - Nephrology informed  CAD, S/P Single vessel CABG in 2001, Hx of Afib  - ASA  - Dyspnea w/o chest pain  - EKG with slightly more prominent Q waves in V1/V2 on admission - tele: Afib w/ bradycardia (40s); runs of PVCs and intermittent asystole  - toponins x 3: Neg - Will call her Cardiologist today  Diastolic CHF  - Appears to be overall euvolemic, weight less than her goal dry weight  - Not currently taking diuretics, prescribed by cardiology for weight over 136 so will hold with weight 128 on admission  HTN - Intermittent - BPs last 24: (148-190)/(46-116) 177/60 mmHg; On admit 212/65 - on flomax  DM2  - A1c: 6.2;  09/21/2012 6.3  - Attenuated to 6 units daily, hold home sliding scale, Q4 sliding scale here   IBS - holding hyoscyamine GERD- IV PPI  Hypothyroidism - Synthroid: dose 13mcg   Bladder discomfort   - Pt complained of bladder pain s/p foley removal; Bladder scan 337 (4/1) - I&O cath = 300 - On Flomax - will obtain UA to evaluate for UTI s/p foley  FEN/GI: n.p.o. for now, NS @ 75cc/hr Prophylaxis: Coumadin per pharm  Disposition: Admit to telemetry for stroke workup and close monitoring  Subjective:  Slightly confused this morning. No complaints. Denies CP, SOB, dysuria or palpitations.   Objective: Temp:  [  97.8 F (36.6 C)-98.7 F (37.1 C)] 97.8 F (36.6 C) (04/02 0456) Pulse Rate:  [50-75] 51 (04/02 0456) Resp:  [16-18] 18 (04/02 0456) BP: (148-190)/(46-116) 177/60 mmHg (04/02 0456) SpO2:  [96 %-100 %] 96 % (04/02 0456) Physical Exam: Gen: NAD, alert, cooperative with exam  CV: Distant heart sounds, irregular bradycardia  Resp: Breath sounds clear but diminished  throughout Abd: Soft , mild tenderness throughout to palpation, no guarding  Ext: 1+ pitting edema on right lower extremity, BKA on the left, right lower leg with 2 large bony calluses about halfway down from the knee   Laboratory:  Recent Labs Lab 11/19/13 1147 11/19/13 1154 11/20/13 0043  WBC 6.5  --  5.3  HGB 14.4 15.0 12.4  HCT 44.9 44.0 38.7  PLT 129*  --  124*    Recent Labs Lab 11/19/13 1147 11/19/13 1154 11/20/13 0043  NA 142 140 141  K 4.8 4.5 4.2  CL 101 97 101  CO2 34*  --  29  BUN 27* 27* 22  CREATININE 1.00 1.20* 0.93  CALCIUM 10.0  --  9.0  PROT 6.0  --   --   BILITOT 0.5  --   --   ALKPHOS 67  --   --   ALT 18  --   --   AST 21  --   --   GLUCOSE 174* 167* 133*    Recent Labs Lab 11/18/13 11/19/13 1147 11/20/13 0043 11/21/13 0158  INR 1.8 1.78* 1.91* 1.58*   Urinalysis    Component Value Date/Time   COLORURINE YELLOW 11/19/2013 1310   APPEARANCEUR TURBID* 11/19/2013 1310   LABSPEC 1.015 11/19/2013 1310   PHURINE 8.0 11/19/2013 1310   GLUCOSEU NEGATIVE 11/19/2013 1310   HGBUR NEGATIVE 11/19/2013 1310   BILIRUBINUR NEGATIVE 11/19/2013 1310   KETONESUR NEGATIVE 11/19/2013 1310   PROTEINUR NEGATIVE 11/19/2013 1310   UROBILINOGEN 0.2 11/19/2013 1310   NITRITE NEGATIVE 11/19/2013 1310   LEUKOCYTESUR NEGATIVE 11/19/2013 1310   Imaging/Diagnostic Tests: CT head 11/19/2013  IMPRESSION:  Remote right parietal -occipital lobe infarct with encephalomalacia.  Remote left cerebellar infarct with encephalomalacia. Remote small  right cerebellar infarct.  Small vessel disease type changes.  The basilar artery is slightly dense, more so than on prior exams.  Basilar artery thrombosis cannot be excluded in the proper clinical  setting.  Atrophy.  Ventricular prominence possibly related to central atrophy although  cannot exclude hydrocephalus. This is without change.  Prominent vascular calcifications.   MRI 11/19/2013 - incomplete images do to patient's  middle status, collected images were motion degradated   EKG 11/19/2013: Sinus bradycardia Q waves in V1 and V2 Q waves slightly more prominent than last 1 month ago)  Phill Myron, MD 11/21/2013, 7:48 AM PGY-1, [facility] Intern pager: [phone], text pages welcome

## 2013-11-21 NOTE — Progress Notes (Signed)
Attending Addendum  I examined the patient and discussed the assessment and plan with Dr. Berkley Harvey. I have reviewed the note and agree.  Plan for MRI/MRA to r/o new stroke vs TIA. No further deficits.  Cards consult given bradycardia, hypertension ? Adding hydralazine vs aldactone for BP control.  PT consult-difficulty with prosthesis.  Dispo-to home vs SNF pending completion of w/u.     Elizabeth [name], [place]

## 2013-11-21 NOTE — Progress Notes (Addendum)
Speech Language Pathology Treatment: Dysphagia  Patient Details Name: Elizabeth [name] MRN: [id number] DOB: [birth year] Today's Date: 11/21/2013 Time: 1150-1200 SLP Time Calculation (min): 10 min  Assessment / Plan / Recommendation Clinical Impression  Treatment concentrated on pt. safety and efficiency with recommended diet/liquids.  No indications of oropharyngeal dysphagia during observation.  Pt.'s spouse and RN report no difficulty (multiple pills with water).  Vitals have remained stable since admit.  Continue regular texture diet/liquids. Speech-language-cognition assessment was ordered.  Spouse stated cognitive function is close to baseline, however she has intermittent confusion in evening (underlying dementia??).  Full assessment deferred at this time.  She may benefit if cognitive status worsens. No further ST needed.   HPI HPI: 72 y.o. female presenting with sudden mental status change with concern for acute stroke . PMH is significant for stroke, Esophageal candidiasis, gastritis, DM2, hyperlipidemia, HTN, CAD s/p CABG, renal transplant, A. fib on anticoagulation, irritable bowel syndrome, GERD, diastolic CHF. CT Remote right parietal -occipital lobe infarct with encephalomalacia. Remote left cerebellar infarct with encephalomalacia. Remote small right cerebellar infarct. MRI No definite evidence of acute infarct. Hypoxia/Desaturation per nursing started shortly after vomiting, some concern for aspiration, thereforer ST consulted to assess swallow function.       Pertinent Vitals WDL  SLP Plan  All goals met    Recommendations Diet recommendations: Regular;Thin liquid Liquids provided via: Cup;Straw Medication Administration: Whole meds with liquid Supervision: Patient able to self feed Compensations: Slow rate;Small sips/bites Postural Changes and/or Swallow Maneuvers: Seated upright 90 degrees              Oral Care Recommendations: Oral care BID Follow up Recommendations:  None Plan: All goals met    GO     Houston Siren M.Ed [employer] [phone]  11/21/2013

## 2013-11-21 NOTE — Progress Notes (Signed)
Physical Therapy Treatment Patient Details Name: Elizabeth [name] MRN: [id number] DOB: [birth year] Today's Date: 11/21/2013    History of Present Illness Elizabeth [name] is a 73 y.o. female presenting with sudden mental status change with concern for acute stroke . PMH is significant for stroke, DM2, hyperlipidemia, HTN, CAD s/p CABG, renal transplant, A. fib on anticoagulation, irritable bowel syndrome, GERD, diastolic CHF.  Possible acute CVA.    PT Comments    Unable to secure prothesis on LLE despite multiple attempts. Eventually transferred patient to recliner via anterior-poterior transfer with use of pad. OT was able to contact patients husband who stated that patients cognition is back to baseline and he is set up at home to care for her and feels safe doing so. Will continue with current POC.   Follow Up Recommendations  No PT follow up;Supervision/Assistance - 24 hour     Equipment Recommendations       Recommendations for Other Services       Precautions / Restrictions Precautions Precautions: Fall Required Braces or Orthoses: Other Brace/Splint Other Brace/Splint: LLE prosthetic, RLE molded KAFO with modifications    Mobility  Bed Mobility Overal bed mobility: Needs Assistance Bed Mobility: Rolling;Supine to Sit Rolling: Min assist   Supine to sit: Mod assist     General bed mobility comments: Assist to elevate trunk and pull to sitting position using rail and therapist.  Transfers Overall transfer level: Needs assistance Equipment used: 1 person hand held assist Transfers: Anterior-Posterior Transfer Sit to Stand: +2 physical assistance;Min assist     Anterior-Posterior transfers: Total assist;+2 physical assistance   General transfer comment: unable to get adequate fit of prosthesis for safe stand pivot  Ambulation/Gait                 Stairs            Wheelchair Mobility    Modified Rankin (Stroke Patients Only) Modified Rankin  (Stroke Patients Only) Pre-Morbid Rankin Score: Moderately severe disability Modified Rankin: Severe disability     Balance Overall balance assessment: Needs assistance   Sitting balance-Leahy Scale: Poor   Postural control: Posterior lean                          Cognition Arousal/Alertness: Awake/alert Behavior During Therapy: Anxious Overall Cognitive Status: History of cognitive impairments - at baseline                      Exercises      General Comments        Pertinent Vitals/Pain no apparent distress     Home Living                      Prior Function            PT Goals (current goals can now be found in the care plan section) Progress towards PT goals: Progressing toward goals    Frequency  Min 3X/week    PT Plan Current plan remains appropriate    Co-evaluation PT/OT/SLP Co-Evaluation/Treatment: Yes Reason for Co-Treatment: Necessary to address cognition/behavior during functional activity;Other (comment) PT goals addressed during session: Mobility/safety with mobility;Balance;Proper use of DME       End of Session Equipment Utilized During Treatment: Gait belt Activity Tolerance: Patient limited by fatigue Patient left: in chair;with call bell/phone within reach;with chair alarm set     Time: 1025-8527 PT Time Calculation (min): 44 min  Charges:  $Therapeutic Activity: 23-37 mins                    G Codes:      Elizabeth [name] 11/21/2013, 1:37 PM 11/21/2013 Elizabeth [name] PTA [phone] pager [phone] office

## 2013-11-21 NOTE — Progress Notes (Signed)
*  PRELIMINARY RESULTS* Vascular Ultrasound Carotid Duplex (Doppler) has been completed.  Preliminary findings: Bilateral:  1-39% ICA stenosis.  Vertebral artery flow is antegrade.   Technically limited due to patient movement.   Landry Mellow, RDMS, RVT  11/21/2013, 2:10 PM

## 2013-11-21 NOTE — Discharge Summary (Signed)
[facility] Discharge Summary  Patient name: Elizabeth [name] Medical record number: [id number] Date of birth: 04-03-1941 Age: 73 y.o. Gender: female Date of Admission: 11/19/2013  Date of Discharge: 11/23/13 Admitting Physician: Minerva Ends, MD  Primary Care Provider: Donetta Potts, MD Consultants: Neurology   Indication for Hospitalization: Altered mental status  Discharge Diagnoses/Problem List:   Acute encephalopathy  DM2  HTN  CAD s/p CABG 2001    Afib  Bradycardia  Diastolic HF  CKD s/p renal transplant  Hypothyriodism  GERD  IBS  Disposition: home  Discharge Condition: stable  Brief Hospital Course:  [name] is a 73 y.o. female who presented with sudden mental status change with concern for acute stroke. PMH is significant for stroke, DM2, hyperlipidemia, HTN, CAD s/p CABG, renal transplant, A. fib on anticoagulation, irritable bowel syndrome, GERD, diastolic CHF. Stroke work-up was negative for new stroke. She was evaluated by PT/OT who recommended SNF vs 24hr assistance and husband desired to take her home.   Acute encephalopathy: CT head and MRI/MRA negative for acute stroke. No infections noted. Ammonia was wnl. Dementia work-up revealed diffuse slowing on EEG w/o evidence of an epileptic disorder. Her mental status had returned normal and she was discharged home to continue dementia work-up as out patient.   Hypoxia/Desaturation: Resolved. Period desaturation after vomiting episode concerning for aspiration. CXR: Bilateral pleural effusions and bibasilar atelectasis. Quickly weaned off oxygen, and cleared by speech to eat.     Bladder Pain:  Pt complained of bladder pain s/p foley removal. Bladder scan negative for retention and UA negative for infection. Continued Flomax.    DM2: A1c is 6.2 and maintained on SSI while inpatient and discharge home on her previous insulin regimen.   CKD s/p renal  transplant: Creatinine stayed at baseline. Continued antirejection meds: Tacrolimus, prednisone, mycophenolate. Nephrology informed.    HTN - Intermittent w/ wide pulse pressure. Called her cardiologist to notify them of her admission.   Cardiac (CAD, S/P Single vessel CABG in 2001, Hx of Afib, Diastolic HF): Initially  Dyspnea w/o chest pain on admission with EKG showing slightly more prominent Q waves in V1/V2. Telemetry showed bradycardia (40s); runs of PVCs and intermittent asystole. Toponins x 3 were negative. She appeared to be overall euvolemic, weight less than her goal dry weight.  Not currently taking diuretics, prescribed by cardiology for weight over 136 and weight remained below this through admission.   Hypothyroidism - Synthroid: dose 178mcg   Issues for Follow Up:   Continue dementia work-up  Follow up BPs: Intermittent HTN  Significant Procedures:   EEG  Significant Labs and Imaging:   Recent Labs Lab 11/19/13 1147 11/19/13 1154 11/20/13 0043 11/22/13 0556  WBC 6.5  --  5.3 4.1  HGB 14.4 15.0 12.4 13.5  HCT 44.9 44.0 38.7 41.2  PLT 129*  --  124* 113*    Recent Labs Lab 11/19/13 1147 11/19/13 1154 11/20/13 0043 11/22/13 0556  NA 142 140 141 137  K 4.8 4.5 4.2 4.1  CL 101 97 101 100  CO2 34*  --  29 26  GLUCOSE 174* 167* 133* 123*  BUN 27* 27* 22 26*  CREATININE 1.00 1.20* 0.93 0.95  CALCIUM 10.0  --  9.0 9.1  ALKPHOS 67  --   --   --   AST 21  --   --   --   ALT 18  --   --   --   ALBUMIN 3.3*  --   --   --  Recent Labs Lab 11/20/13 1055 11/20/13 1859 11/21/13 0158  TROPONINI <0.30 <0.30 <0.30   CBG (last 3)   Recent Labs  11/23/13 0848 11/23/13 1143 11/23/13 1702  GLUCAP 257* 166* 351*    Recent Labs Lab 11/18/13 11/19/13 1147 11/20/13 0043 11/21/13 0158 11/22/13 0556  INR 1.8 1.78* 1.91* 1.58* 1.49   Lipid Panel     Component Value Date/Time   CHOL 118 11/20/2013 0043   TRIG 72 11/20/2013 0043   HDL 65 11/20/2013  0043   CHOLHDL 1.8 11/20/2013 0043   VLDL 14 11/20/2013 0043   LDLCALC 39 11/20/2013 0043   Urinalysis    Component Value Date/Time   COLORURINE YELLOW 11/22/2013 0846   APPEARANCEUR CLOUDY* 11/22/2013 0846   LABSPEC 1.021 11/22/2013 0846   PHURINE 6.0 11/22/2013 0846   GLUCOSEU NEGATIVE 11/22/2013 0846   HGBUR TRACE* 11/22/2013 0846   BILIRUBINUR NEGATIVE 11/22/2013 0846   KETONESUR 15* 11/22/2013 0846   PROTEINUR 30* 11/22/2013 0846   UROBILINOGEN 1.0 11/22/2013 0846   NITRITE NEGATIVE 11/22/2013 0846   LEUKOCYTESUR MODERATE* 11/22/2013 0846   B12: 913 TSH: 4.96 RPR: Non reactive  Imaging/Diagnostic Tests:  CT head 11/19/2013  IMPRESSION:  Remote right parietal -occipital lobe infarct with encephalomalacia.  Remote left cerebellar infarct with encephalomalacia. Remote small  right cerebellar infarct.  Small vessel disease type changes.  The basilar artery is slightly dense, more so than on prior exams.  Basilar artery thrombosis cannot be excluded in the proper clinical  setting.  Atrophy.  Ventricular prominence possibly related to central atrophy although  cannot exclude hydrocephalus. This is without change.  Prominent vascular calcifications.   MRI 11/19/2013 - incomplete images do to patient's middle status, collected images were motion  degradated   MRI MRA 11/21/13  IMPRESSION:  1. No acute infarct.  2. Remote cerebellar and right parieto-occipital infarcts,  unchanged.  3. Unchanged chronic small vessel ischemic disease and cerebral  atrophy.  4. Severely motion degraded MRA examination without evidence of  proximal vessel occlusion.   EKG 11/19/2013: Sinus bradycardia Q waves in V1 and V2 Q waves slightly more prominent than last 1 month ago)  Results/Tests Pending at Time of Discharge:   Discharge Medications:    Medication List         aspirin 325 MG tablet  Take 1 tablet (325 mg total) by mouth daily.     atorvastatin 10 MG tablet  Commonly known as:  LIPITOR  Take 1  tablet (10 mg total) by mouth every other day.     cephALEXin 500 MG capsule  Commonly known as:  KEFLEX  Take 1 capsule (500 mg total) by mouth every 6 (six) hours.     cholecalciferol 1000 UNITS tablet  Commonly known as:  VITAMIN D  Take 1,000 Units by mouth daily.     COUMADIN 5 MG tablet  Generic drug:  warfarin  Take 2.5-5 mg by mouth every evening. Take 2.5mg  on Monday and Wednesday. All other days take 5mg .     docusate sodium 100 MG capsule  Commonly known as:  COLACE  Take 100 mg by mouth at bedtime.     ferrous fumarate 325 (106 FE) MG Tabs tablet  Commonly known as:  HEMOCYTE - 106 mg FE  Take 1 tablet by mouth daily.     HYDROcodone-acetaminophen 5-325 MG per tablet  Commonly known as:  NORCO/VICODIN  Take 2 tablets by mouth 2 (two) times daily as needed for moderate pain.  hyoscyamine 0.125 MG Tbdp disintergrating tablet  Commonly known as:  ANASPAZ  Place 0.125 mg under the tongue as needed for cramping.     insulin glargine 100 UNIT/ML injection  Commonly known as:  LANTUS  Inject 8 Units into the skin every morning.     insulin lispro 100 UNIT/ML injection  Commonly known as:  HUMALOG  Inject 2-9 Units into the skin 4 (four) times daily -  before meals and at bedtime. Per sliding scale.     levothyroxine 175 MCG tablet  Commonly known as:  SYNTHROID, LEVOTHROID  Take 175 mcg by mouth daily.     LEXAPRO 10 MG tablet  Generic drug:  escitalopram  Take 10 mg by mouth daily.     Magnesium 250 MG Tabs  Take 800 mg by mouth 2 (two) times daily.     mirtazapine 7.5 MG tablet  Commonly known as:  REMERON  Take 7.5 mg by mouth at bedtime.     multivitamin with minerals Tabs tablet  Take 1 tablet by mouth daily.     mycophenolate 360 MG Tbec EC tablet  Commonly known as:  MYFORTIC  Take 360 mg by mouth 2 (two) times daily.     nitroGLYCERIN 0.4 MG SL tablet  Commonly known as:  NITROSTAT  Place 1 tablet (0.4 mg total) under the tongue every 5  (five) minutes as needed for chest pain.     omeprazole 20 MG capsule  Commonly known as:  PRILOSEC  Take 20 mg by mouth at bedtime.     ondansetron 8 MG tablet  Commonly known as:  ZOFRAN  Take 8 mg by mouth every morning.     potassium chloride 10 MEQ tablet  Commonly known as:  K-DUR  Take 10 mEq by mouth daily at 12 noon.     predniSONE 5 MG tablet  Commonly known as:  DELTASONE  Take 5 mg by mouth every morning.     promethazine 12.5 MG tablet  Commonly known as:  PHENERGAN  Take 12.5 mg by mouth as needed for nausea or vomiting.     tacrolimus 0.5 MG capsule  Commonly known as:  PROGRAF  Take 1-2 mg by mouth 2 (two) times daily. Take 2mg  in the am & 1mg  at night     tamsulosin 0.4 MG Caps capsule  Commonly known as:  FLOMAX  Take 0.4 mg by mouth daily.        Discharge Instructions: Please refer to Patient Instructions section of EMR for full details.  Patient was counseled important signs and symptoms that should prompt return to medical care, changes in medications, dietary instructions, activity restrictions, and follow up appointments.   Follow-Up Appointments: Follow-up Information   Follow up with COLADONATO,JOSEPH A, MD. Schedule an appointment as soon as possible for a visit in 1 week. (For hospital follow-up)    Specialty:  Nephrology   Contact information:   [facility] [phone]      Phill Myron, MD 11/24/2013, 8:40 PM PGY-1, [place]

## 2013-11-22 ENCOUNTER — Inpatient Hospital Stay (HOSPITAL_COMMUNITY): Payer: Medicare Other

## 2013-11-22 DIAGNOSIS — I495 Sick sinus syndrome: Secondary | ICD-10-CM

## 2013-11-22 DIAGNOSIS — I2510 Atherosclerotic heart disease of native coronary artery without angina pectoris: Secondary | ICD-10-CM

## 2013-11-22 DIAGNOSIS — G9340 Encephalopathy, unspecified: Principal | ICD-10-CM

## 2013-11-22 DIAGNOSIS — I6350 Cerebral infarction due to unspecified occlusion or stenosis of unspecified cerebral artery: Secondary | ICD-10-CM

## 2013-11-22 DIAGNOSIS — I10 Essential (primary) hypertension: Secondary | ICD-10-CM

## 2013-11-22 DIAGNOSIS — R609 Edema, unspecified: Secondary | ICD-10-CM

## 2013-11-22 DIAGNOSIS — Z940 Kidney transplant status: Secondary | ICD-10-CM

## 2013-11-22 LAB — CBC
HEMATOCRIT: 41.2 % (ref 36.0–46.0)
HEMOGLOBIN: 13.5 g/dL (ref 12.0–15.0)
MCH: 29.3 pg (ref 26.0–34.0)
MCHC: 32.8 g/dL (ref 30.0–36.0)
MCV: 89.6 fL (ref 78.0–100.0)
Platelets: 113 10*3/uL — ABNORMAL LOW (ref 150–400)
RBC: 4.6 MIL/uL (ref 3.87–5.11)
RDW: 14.7 % (ref 11.5–15.5)
WBC: 4.1 10*3/uL (ref 4.0–10.5)

## 2013-11-22 LAB — TSH: TSH: 4.96 u[IU]/mL — ABNORMAL HIGH (ref 0.350–4.500)

## 2013-11-22 LAB — URINALYSIS, ROUTINE W REFLEX MICROSCOPIC
BILIRUBIN URINE: NEGATIVE
Glucose, UA: NEGATIVE mg/dL
Ketones, ur: 15 mg/dL — AB
NITRITE: NEGATIVE
Protein, ur: 30 mg/dL — AB
SPECIFIC GRAVITY, URINE: 1.021 (ref 1.005–1.030)
UROBILINOGEN UA: 1 mg/dL (ref 0.0–1.0)
pH: 6 (ref 5.0–8.0)

## 2013-11-22 LAB — RPR: RPR Ser Ql: NONREACTIVE

## 2013-11-22 LAB — GLUCOSE, CAPILLARY
GLUCOSE-CAPILLARY: 148 mg/dL — AB (ref 70–99)
Glucose-Capillary: 115 mg/dL — ABNORMAL HIGH (ref 70–99)
Glucose-Capillary: 204 mg/dL — ABNORMAL HIGH (ref 70–99)
Glucose-Capillary: 187 mg/dL — ABNORMAL HIGH (ref 70–99)
Glucose-Capillary: 194 mg/dL — ABNORMAL HIGH (ref 70–99)

## 2013-11-22 LAB — BASIC METABOLIC PANEL
BUN: 26 mg/dL — ABNORMAL HIGH (ref 6–23)
CHLORIDE: 100 meq/L (ref 96–112)
CO2: 26 meq/L (ref 19–32)
Calcium: 9.1 mg/dL (ref 8.4–10.5)
Creatinine, Ser: 0.95 mg/dL (ref 0.50–1.10)
GFR calc Af Amer: 67 mL/min — ABNORMAL LOW (ref >90)
GFR calc non Af Amer: 58 mL/min — ABNORMAL LOW (ref >90)
GLUCOSE: 123 mg/dL — AB (ref 70–99)
POTASSIUM: 4.1 meq/L (ref 3.7–5.3)
SODIUM: 137 meq/L (ref 137–147)

## 2013-11-22 LAB — URINE MICROSCOPIC-ADD ON

## 2013-11-22 LAB — VITAMIN B12: Vitamin B-12: 913 pg/mL — ABNORMAL HIGH (ref 211–911)

## 2013-11-22 LAB — PROTIME-INR
INR: 1.49 (ref 0.00–1.49)
Prothrombin Time: 17.6 seconds — ABNORMAL HIGH (ref 11.6–15.2)

## 2013-11-22 MED ORDER — WARFARIN SODIUM 7.5 MG PO TABS
7.5000 mg | ORAL_TABLET | Freq: Once | ORAL | Status: AC
  Administered 2013-11-22: 7.5 mg via ORAL
  Filled 2013-11-22: qty 1

## 2013-11-22 MED ORDER — ADULT MULTIVITAMIN W/MINERALS CH
1.0000 | ORAL_TABLET | Freq: Every day | ORAL | Status: DC
  Administered 2013-11-22 – 2013-11-23 (×2): 1 via ORAL
  Filled 2013-11-22 (×2): qty 1

## 2013-11-22 NOTE — Progress Notes (Signed)
INITIAL NUTRITION ASSESSMENT  DOCUMENTATION CODES Per approved criteria  -Not Applicable   INTERVENTION: Encourage PO intake Provide Multivitamin with minerals daily Provide Snacks BID if PO intake remains poor  NUTRITION DIAGNOSIS: Inadequate oral intake related to AMS and current medical condition as evidenced by varied meal completion 0%, 10%, and 65%.   Goal: Pt to meet >/= 90% of their estimated nutrition needs   Monitor:  PO intake, weight trend, labs  Reason for Assessment: Low [place]aden  73 y.o. female  Admitting Dx: <principal problem not specified>  ASSESSMENT: 73 y.o. female presenting with sudden mental status change with concern for acute stroke . PMH is significant for stroke, DM2, hyperlipidemia, HTN, CAD s/p CABG, renal transplant, A. fib on anticoagulation, irritable bowel syndrome, GERD, diastolic CHF.   Per nursing notes pt's meal completion yesterday was 10%, 0%, and 65%. Per pt's husband pt didn't eat well yesterday due to procedure and receiving Ativan which made pt sleepy. Pt reports that her appetite is good today and she ate 90% of her breakfast. She reports eating well PTA, denies use of nutritional supplements- she does not like Glucerna. Pt reports recent weight gain due to fluid and that her usual body weight is 118 to 120 lbs without prosthetics and 130 lbs with her prosthetics. Husband reports pt's weight PTA was 128 lbs with prosthetics. Encourage pt to eat 3 healthful meals daily. If PO intake remains poor, pt agreeable to receiving snacks between meals.   Height: Ht Readings from Last 1 Encounters:  11/19/13 5\' 3"  (1.6 m)    Weight: Wt Readings from Last 1 Encounters:  11/22/13 119 lb (53.978 kg)    Ideal Body Weight: 108 lbs  % Ideal Body Weight: 110%  Wt Readings from Last 10 Encounters:  11/22/13 119 lb (53.978 kg)  11/13/13 171 lb (77.565 kg)  10/21/13 170 lb (77.111 kg)  02/18/13 133 lb (60.328 kg)  11/02/12 143 lb (64.864 kg)   10/10/12 149 lb 7.6 oz (67.8 kg)  09/27/12 142 lb 13.7 oz (64.8 kg)  09/04/12 146 lb (66.225 kg)  06/21/12 154 lb (69.854 kg)  06/13/12 154 lb (69.854 kg)    Usual Body Weight: 118 to 120 lbs  % Usual Body Weight: 100%  BMI:  Body mass index is 21.09 kg/(m^2).  Estimated Nutritional Needs: Kcal: 1300-1500 Protein: 60-70 grams Fluid: 1.3-1.5 L/day  Skin: +2 RLE edema, stage 3 pressure ulcer to right heel  Diet Order: General  EDUCATION NEEDS: -No education needs identified at this time   Intake/Output Summary (Last 24 hours) at 11/22/13 1153 Last data filed at 11/22/13 0900  Gross per 24 hour  Intake    240 ml  Output     50 ml  Net    190 ml    Last BM: 4/1   Labs:   Recent Labs Lab 11/19/13 1147 11/19/13 1154 11/20/13 0043 11/22/13 0556  NA 142 140 141 137  K 4.8 4.5 4.2 4.1  CL 101 97 101 100  CO2 34*  --  29 26  BUN 27* 27* 22 26*  CREATININE 1.00 1.20* 0.93 0.95  CALCIUM 10.0  --  9.0 9.1  GLUCOSE 174* 167* 133* 123*    CBG (last 3)   Recent Labs  11/21/13 2349 11/22/13 0427 11/22/13 0815  GLUCAP 248* 115* 148*    Scheduled Meds: . aspirin  325 mg Oral Daily  . atorvastatin  10 mg Oral QODAY  . docusate sodium  100 mg Oral QHS  .  escitalopram  10 mg Oral Daily  . ferrous fumarate  1 tablet Oral Daily  . insulin aspart  0-9 Units Subcutaneous 6 times per day  . levothyroxine  175 mcg Oral QAC breakfast  . LORazepam  1 mg Intravenous Once  . mycophenolate  360 mg Oral BID  . ondansetron  4 mg Oral 3 times per day  . pantoprazole  40 mg Oral QHS  . predniSONE  5 mg Oral q morning - 10a  . tacrolimus  1 mg Oral QPM  . tacrolimus  2 mg Oral q morning - 10a  . tamsulosin  0.4 mg Oral Daily  . Warfarin - Pharmacist Dosing Inpatient   Does not apply q1800    Continuous Infusions: . sodium chloride 20 mL/hr (11/20/13 1508)    Past Medical History  Diagnosis Date  . Coronary artery disease   . Chronic kidney disease     s/p  cadaveric renal transplant in 2001  . Diabetes mellitus   . PVD (peripheral vascular disease)   . History of orthostatic hypotension   . Hyperlipidemia   . Hypothyroidism   . Anemia   . Pancreatic cyst   . Osteoporosis     recurrent fractures  . Melanoma   . Carotid bruit     left  . Adenomatous polyp   . GERD (gastroesophageal reflux disease)   . Gastritis   . CVA (cerebral infarction)   . Bradycardia   . Renal artery stenosis   . Diabetic retinopathy   . Renal osteodystrophy   . Hyperparathyroidism   . Leg ulcer     from poor fitting prosthetic  . H/O immunosuppressive therapy   . 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 Fx wrist   . Fx ankle   . Neuropathy   . Nephropathy   . Carotid artery stenosis     mild  . Stroke   . Tubular adenoma   . Colitis   . IBS (irritable bowel syndrome)   . Ischemic colitis   . 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 Esophageal candidiasis   . 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 Fracture of right maxilla 09/21/2012  . Right orbit fracture 09/21/2012  . TBI (traumatic brain injury) 09/30/2012  . Zygoma fracture 09/21/2012  . Compression fracture of L4 lumbar vertebra   . Pulmonary embolism     Past Surgical History  Procedure Laterality Date  . Kidney transplant  08/15/2000    [facility] in [place]  . 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 Appendectomy    . Cataract extraction    . Cholecystectomy    . Abdominal hysterectomy    . Thyroid surgery    . Tonsillectomy    . Cesarean section      x 2  . Vitrectomy      right eye  . Coronary artery bypass graft    . Colonoscopy w/ biopsies and polypectomy  02/18/2009    adenomatous polyps, diverticulosis, internal hemorrhoids  . Eus  02/04/2010    w/FNA, pancreatic cyst  . Upper gastrointestinal endoscopy  12/01/2009  . Colonoscopy  01/30/2012     Procedure: COLONOSCOPY;  Surgeon: Milus Banister, MD;  Location: [facility];  Service: Endoscopy;  Laterality: N/A;  . Esophagogastroduodenoscopy  05/02/2012    Procedure: ESOPHAGOGASTRODUODENOSCOPY (EGD);  Surgeon: Gatha Mayer, MD;  Location: [facility];  Service: Endoscopy;  Laterality: N/A;    Pryor Ochoa RD, LDN Inpatient Clinical Dietitian Pager: [phone] After Hours Pager: [phone]

## 2013-11-22 NOTE — Procedures (Signed)
ELECTROENCEPHALOGRAM REPORT  Patient: Elizabeth [name]       Room #: 4O27 EEG No. ID: [id number] Age: 73 y.o.        Sex: female Referring Physician: Funches Report Date:  11/22/2013        Interpreting Physician: Anthony Sar  History: Elizabeth [name] is an 73 y.o. female admitted for altered mental status and possible recurrent stroke. Stroke his been ruled out. Patient has remained confused and emotionally labile.  Indications for study:  Assess severity of encephalopathy.  Technique: This is an 18 channel routine scalp EEG performed at the bedside with bipolar and monopolar montages arranged in accordance to the international 10/20 system of electrode placement.   Description: This EEG recording was performed during wakefulness. Predominant background activity consisted of 1-2 Hz somewhat irregular delta activity with superimposed faster rhythms symmetrically, including occasional 9 Hz alpha rhythm report from the posterior head regions. Intermittent rhythmic fairly high amplitude generalized delta activity was frequently recorded, lasting up to 3-4 seconds. Photic stimulation was not performed. Hyperventilation was not performed. No epileptiform discharges are recorded.  Interpretation: CT is abnormal with mild to moderate nonspecific generalized slowing of cerebral activity. This pattern of slowing can be seen with metabolic as well as degenerative and toxic encephalopathies. There was no evidence of an epileptic disorder demonstrated.   Rush Farmer M.D. [facility] [phone]

## 2013-11-22 NOTE — Progress Notes (Signed)
Subjective: No complaints other than feeling somewhat upset and tearful. Patient was not specific with respect to any particular stressors.  Objective: Current vital signs: BP 178/53  Pulse 48  Temp(Src) 97.7 F (36.5 C) (Oral)  Resp 16  Ht 5\' 3"  (1.6 m)  Wt 53.978 kg (119 lb)  BMI 21.09 kg/m2  SpO2 97%  Neurologic Exam: Alert and fairly anxious and tearful intermittently. Patient was oriented to person and correct month but disoriented to place. Pupils were irregular from previous surgery and neither pupil reacted to light. Extraocular movements were full and conjugate. Left visual field defect to finger counting (known old right parieto-occipital stroke). No facial weakness noted. Speech was normal without dysarthria. Strength of upper extremities was normal proximally distally. Proximal strength of lower extremities was normal. Distal right lower extremity strength not tested (wearing brace with recent trauma).  MRI of brain did not show an acute intracranial abnormality, including no acute stroke. MRA showed no significant proximal vessel stenosis or occlusion intracranially.  Carotid Doppler study showed no significant right or left ICA stenosis. Vertebral artery flow was antegrade.  Fasting lipid panel was normal.  Medications: I have reviewed the patient's current medications.  Assessment/Plan: Altered mental status of unclear etiology. Patient has no indications at this point of acute recurrent stroke. She still only slightly confused and emotionally labile. Because of her persistent mental status changes, including memory, early dementia needs to be ruled out.  Recommendations: 1. Vitamin B12, TSH and RPR 2. EEG, routine adult study, to rule out slowing of cerebral activity.  We will continue to follow this patient with you.  C.R. Nicole Kindred, MD [facility] [phone]  11/22/2013  9:42 AM

## 2013-11-22 NOTE — Progress Notes (Signed)
Order for strict I/Os. Majority of the time patient is incontinent of bladder making it difficult to do strict I/Os. Will encourage patient to try and void to [facility] if she is able to voice the need to urinate.

## 2013-11-22 NOTE — Progress Notes (Signed)
Family Medicine Teaching Service Daily Progress Note Intern Pager: [phone]  Patient name: Elizabeth [name] Medical record number: [id number] Date of birth: [birth year] Age: 73 y.o. Gender: female  Primary Care Provider: Donetta Potts, MD Consultants: neurology Code Status: full  Pt Overview and Major Events to Date:  3/31: AMS, possible stroke 4/1: Mental status improved; awaiting initial imaging to be completed; Notified her Cardiologist  Assessment and Plan: Elizabeth [name] is a 73 y.o. female presenting with sudden mental status change with concern for acute stroke . PMH is significant for stroke, DM2, hyperlipidemia, HTN, CAD s/p CABG, renal transplant, A. fib on anticoagulation, irritable bowel syndrome, GERD, diastolic CHF.   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 Other etiology possibly include infectious (UA and CXR clean), medication-induced (no new meds), Cardiac (see below) or metabolic. salicylate & ammonia: wnl - CT head old infarct in right parietal and left cerebellar areas, small vessel disease changes, and question of basilar artery thrombosis  - Lipid panel (Tchol 118, LDL 39) - Holding Lipitor due to NPO - ASA  - SLP: reg/thin liquid - OT: 24 hrs assistance vs SNF, PT: 24 hrs assistance  - Neurology consulted   TTE: EF 65-70 %; Grade 2 diastolic dysfcn, Mild AV regurg, Mod MV & TV regurg  MRI/MRA: No new infarcts  Carotid dopplers: < 40% stenosis b/l   EEG: ordered  TSH, RPR & B12 pending  Bladder discomfort   - Pt complained of bladder pain s/p foley removal; Bladder scan 337 (4/1) - I&O cath = 300 - On Flomax - UA pending to evaluate for UTI s/p foley  Hypoxia/Desaturation  - Per nursing started shortly after vomiting, some concern for aspiration  - CXR: Bilateral pleural effusions and  bibasilar atelectasis - Off oxygen with good staturation - Cleared by SLP to eat  DM2  - A1c: 6.2;  09/21/2012 6.3  - Attenuated to 6 units daily, hold home sliding scale, Q4 sliding scale here   CKD s/p renal transplant  - Creatinine 0.93 < 1.2 (3/31) - Restarted antirejection meds 4/1 after cleared by SLP: Tacrolimus, prednisone, mycophenolate  - Nephrology informed  HTN - Intermittent w/ wide pulse pressure - BPs last 24: ((143-187)/(40-74) 178/53 mmHg; On admit 212/65 - on flomax - Called her cardiologist  CAD, S/P Single vessel CABG in 2001, Hx of Afib  - ASA  - Dyspnea w/o chest pain  - EKG with slightly more prominent Q waves in V1/V2 on admission - tele: Afib w/ bradycardia (40s); runs of PVCs and intermittent asystole  - toponins x 3: Neg - Notified her Cardiologist of admission  Diastolic CHF  - Appears to be overall euvolemic, weight less than her goal dry weight  - Not currently taking diuretics, prescribed by cardiology for weight over 136 so will hold with weight 128 on admission  DM2  - A1c: 6.2;  09/21/2012 6.3  - Attenuated to 6 units daily, hold home sliding scale, Q4 sliding scale here   IBS - holding hyoscyamine GERD- IV PPI  Hypothyroidism - Synthroid: dose 175mcg   FEN/GI: heart healthy diet, NS @ 75cc/hr Prophylaxis: Coumadin per pharm  Disposition: Admit to telemetry for stroke workup and close monitoring  Subjective:  Complains of bladder pain and needing to use the restroom. Denies CP, SOB, dysuria or palpitations.   Objective: Temp:  [97.3 F (  36.3 C)-98.1 F (36.7 C)] 97.7 F (36.5 C) (04/03 0426) Pulse Rate:  [47-59] 48 (04/03 0426) Resp:  [16-18] 16 (04/03 0426) BP: (143-187)/(40-74) 178/53 mmHg (04/03 0626) SpO2:  [92 %-100 %] 97 % (04/03 0426) Weight:  [119 lb (53.978 kg)] 119 lb (53.978 kg) (04/03 0442) Physical Exam: Gen: NAD, alert, cooperative with exam  CV: Distant heart sounds, irregular bradycardia  Resp: Breath sounds  clear but diminished throughout Abd: Soft , no tenderness throughout to palpation, no guarding  Ext: 1+ pitting edema on right lower extremity, BKA on the left, right lower leg with 2 large bony calluses about halfway down from the knee   Laboratory:  Recent Labs Lab 11/19/13 1147 11/19/13 1154 11/20/13 0043 11/22/13 0556  WBC 6.5  --  5.3 4.1  HGB 14.4 15.0 12.4 13.5  HCT 44.9 44.0 38.7 41.2  PLT 129*  --  124* 113*    Recent Labs Lab 11/19/13 1147 11/19/13 1154 11/20/13 0043 11/22/13 0556  NA 142 140 141 137  K 4.8 4.5 4.2 4.1  CL 101 97 101 100  CO2 34*  --  29 26  BUN 27* 27* 22 26*  CREATININE 1.00 1.20* 0.93 0.95  CALCIUM 10.0  --  9.0 9.1  PROT 6.0  --   --   --   BILITOT 0.5  --   --   --   ALKPHOS 67  --   --   --   ALT 18  --   --   --   AST 21  --   --   --   GLUCOSE 174* 167* 133* 123*    Recent Labs Lab 11/18/13 11/19/13 1147 11/20/13 0043 11/21/13 0158 11/22/13 0556  INR 1.8 1.78* 1.91* 1.58* 1.49   Urinalysis    Component Value Date/Time   COLORURINE YELLOW 11/19/2013 1310   APPEARANCEUR TURBID* 11/19/2013 1310   LABSPEC 1.015 11/19/2013 1310   PHURINE 8.0 11/19/2013 1310   GLUCOSEU NEGATIVE 11/19/2013 1310   HGBUR NEGATIVE 11/19/2013 1310   BILIRUBINUR NEGATIVE 11/19/2013 1310   KETONESUR NEGATIVE 11/19/2013 1310   PROTEINUR NEGATIVE 11/19/2013 1310   UROBILINOGEN 0.2 11/19/2013 1310   NITRITE NEGATIVE 11/19/2013 1310   LEUKOCYTESUR NEGATIVE 11/19/2013 1310   Imaging/Diagnostic Tests: CT head 11/19/2013  IMPRESSION:  Remote right parietal -occipital lobe infarct with encephalomalacia.  Remote left cerebellar infarct with encephalomalacia. Remote small  right cerebellar infarct.  Small vessel disease type changes.  The basilar artery is slightly dense, more so than on prior exams.  Basilar artery thrombosis cannot be excluded in the proper clinical  setting.  Atrophy.  Ventricular prominence possibly related to central atrophy although   cannot exclude hydrocephalus. This is without change.  Prominent vascular calcifications.   MRI 11/19/2013 - incomplete images do to patient's middle status, collected images were motion degradated   MRI MRA 11/21/13 IMPRESSION:  1. No acute infarct.  2. Remote cerebellar and right parieto-occipital infarcts,  unchanged.  3. Unchanged chronic small vessel ischemic disease and cerebral  atrophy.  4. Severely motion degraded MRA examination without evidence of  proximal vessel occlusion.   EKG 11/19/2013: Sinus bradycardia Q waves in V1 and V2 Q waves slightly more prominent than last 1 month ago)  Phill Myron, MD 11/22/2013, 9:20 AM PGY-1, [place] Intern pager: [phone], text pages welcome

## 2013-11-22 NOTE — Progress Notes (Signed)
EEG completed; results pending.    

## 2013-11-22 NOTE — Progress Notes (Signed)
ANTICOAGULATION CONSULT NOTE - FOLLOW UP  Pharmacy Consult:  Coumadin Indication: atrial fibrillation  Allergies  Allergen Reactions  . Erythromycin Nausea And Vomiting  . Fentanyl Nausea And Vomiting  . Percocet [Oxycodone-Acetaminophen] Nausea And Vomiting    Patient Measurements: Height: 5\' 3"  (160 cm) Weight: 119 lb (53.978 kg) IBW/kg (Calculated) : 52.4  Vital Signs: Temp: 97.9 F (36.6 C) (04/03 0950) Temp src: Oral (04/03 0950) BP: 154/56 mmHg (04/03 0950) Pulse Rate: 57 (04/03 0950)  Labs:  Recent Labs  11/20/13 0043 11/20/13 1055 11/20/13 1859 11/21/13 0158 11/22/13 0556  HGB 12.4  --   --   --  13.5  HCT 38.7  --   --   --  41.2  PLT 124*  --   --   --  113*  LABPROT 21.3*  --   --  18.4* 17.6*  INR 1.91*  --   --  1.58* 1.49  CREATININE 0.93  --   --   --  0.95  TROPONINI  --  <0.30 <0.30 <0.30  --     Estimated Creatinine Clearance: 43 ml/min (by C-G formula based on Cr of 0.95).     Assessment: 28 YOF with history of Afib admitted with sudden AMS.  Patient now cleared to resume Coumadin.  INR sub-therapeutic and has trended down, likely d/t missed dose on 11/19/13.  No bleeding reported.   Goal of Therapy:  INR 2-3 Monitor platelets by anticoagulation protocol: Yes    Plan:   - Repeat Coumadin 7.5mg  PO today - Daily PT / INR - F/U decrease ASA dose once INR therapeutic - Watch plts    Darleth Eustache D. Mina Marble, PharmD, BCPS Pager:  [phone] 11/22/2013, 12:54 PM

## 2013-11-22 NOTE — Progress Notes (Signed)
Patient is active with [facility] as prior to admission; Attending MD please order [facility] at discharge for resumption of services; Aneta Mins [phone]

## 2013-11-23 DIAGNOSIS — N390 Urinary tract infection, site not specified: Secondary | ICD-10-CM

## 2013-11-23 LAB — GLUCOSE, CAPILLARY
GLUCOSE-CAPILLARY: 93 mg/dL (ref 70–99)
GLUCOSE-CAPILLARY: 257 mg/dL — AB (ref 70–99)
GLUCOSE-CAPILLARY: 281 mg/dL — AB (ref 70–99)
GLUCOSE-CAPILLARY: 351 mg/dL — AB (ref 70–99)
Glucose-Capillary: 166 mg/dL — ABNORMAL HIGH (ref 70–99)
Glucose-Capillary: 216 mg/dL — ABNORMAL HIGH (ref 70–99)

## 2013-11-23 MED ORDER — CEPHALEXIN 500 MG PO CAPS
500.0000 mg | ORAL_CAPSULE | Freq: Four times a day (QID) | ORAL | Status: DC
  Administered 2013-11-23 (×2): 500 mg via ORAL
  Filled 2013-11-23 (×4): qty 1

## 2013-11-23 MED ORDER — ASPIRIN 325 MG PO TABS: 325.0000 mg | ORAL_TABLET | Freq: Every day | ORAL | Status: DC

## 2013-11-23 MED ORDER — WARFARIN SODIUM 5 MG PO TABS
5.0000 mg | ORAL_TABLET | Freq: Once | ORAL | Status: AC
  Administered 2013-11-23: 5 mg via ORAL
  Filled 2013-11-23: qty 1

## 2013-11-23 MED ORDER — CEPHALEXIN 500 MG PO CAPS
500.0000 mg | ORAL_CAPSULE | Freq: Four times a day (QID) | ORAL | Status: DC
Start: 2013-11-23 — End: 2013-11-25

## 2013-11-23 NOTE — Progress Notes (Signed)
ANTICOAGULATION CONSULT NOTE - FOLLOW UP  Pharmacy Consult:  Coumadin Indication: atrial fibrillation  Allergies  Allergen Reactions  . Erythromycin Nausea And Vomiting  . Fentanyl Nausea And Vomiting  . Percocet [Oxycodone-Acetaminophen] Nausea And Vomiting    Patient Measurements: Height: 5\' 3"  (160 cm) Weight: 119 lb (53.978 kg) IBW/kg (Calculated) : 52.4  Vital Signs: Temp: 97.8 F (36.6 C) (04/04 1447) Temp src: Oral (04/04 1447) BP: 157/46 mmHg (04/04 1447) Pulse Rate: 54 (04/04 1447)  Labs:  Recent Labs  11/20/13 1859 11/21/13 0158 11/22/13 0556  HGB  --   --  13.5  HCT  --   --  41.2  PLT  --   --  113*  LABPROT  --  18.4* 17.6*  INR  --  1.58* 1.49  CREATININE  --   --  0.95  TROPONINI <0.30 <0.30  --     Estimated Creatinine Clearance: 43 ml/min (by C-G formula based on Cr of 0.95).     Assessment: 69 YOF with history of Afib admitted with sudden AMS.  Patient now cleared to resume Coumadin.  INR sub-therapeutic and has trended down, likely d/t missed dose on 11/19/13.  No bleeding reported.   Goal of Therapy:  INR 2-3 Monitor platelets by anticoagulation protocol: Yes    Plan:   - Coumadin 5 mg PO today - Daily PT / INR - F/U decrease ASA dose once INR therapeutic - Watch plts  Thank you. Anette Guarneri, PharmD [phone]  11/23/2013, 3:24 PM

## 2013-11-23 NOTE — Progress Notes (Signed)
Physical Therapy Treatment Patient Details Name: Elizabeth [name] MRN: [id number] DOB: 07/16/1941 Today's Date: 11/23/2013    History of Present Illness Elizabeth [name] is a 73 y.o. female presenting with sudden mental status change with concern for acute stroke . PMH is significant for stroke, DM2, hyperlipidemia, HTN, CAD s/p CABG, renal transplant, A. fib on anticoagulation, irritable bowel syndrome, GERD, diastolic CHF.  Possible acute CVA.  Patient also with h/o of BKA left.  Stroke ruled out.    PT Comments    Patient seen to assess readiness for discharge.  Husband performed transfers and reports that patient requires slightly more assistance than baseline and that he can provide that level of assistance.  Therefore, feel patient is safe for d/c with husband and 24 hour assistance/supervision at home.  Patient has all necessary equipment at home.   Follow Up Recommendations  No PT follow up;Supervision/Assistance - 24 hour     Equipment Recommendations  None recommended by PT    Recommendations for Other Services       Precautions / Restrictions Precautions Precautions: Fall Required Braces or Orthoses: Other Brace/Splint Other Brace/Splint: LLE prosthetic, RLE molded KAFO with modifications Restrictions Weight Bearing Restrictions: No    Mobility  Bed Mobility Overal bed mobility: Needs Assistance       Supine to sit: Min assist     General bed mobility comments: assist only to scoot to edge of bed  Transfers Overall transfer level: Needs assistance Equipment used: 1 person hand held assist Transfers: Sit to/from [facility] Sit to Stand: Min assist Stand pivot transfers: Mod assist       General transfer comment: husband assisted patient in sit to/from stand and stand pivot transfer to w/c.  Reports he can manage her at this level.  She requires slightly more assistance than baseline.  Ambulation/Gait                 Stairs            Wheelchair Mobility    Modified Rankin (Stroke Patients Only)       Balance   Sitting-balance support: No upper extremity supported Sitting balance-Leahy Scale: Fair     Standing balance support: Bilateral upper extremity supported Standing balance-Leahy Scale: Poor                      Cognition Arousal/Alertness: Awake/alert Behavior During Therapy: WFL for tasks assessed/performed Overall Cognitive Status: History of cognitive impairments - at baseline                      Exercises      General Comments        Pertinent Vitals/Pain Denies pain    Home Living                      Prior Function            PT Goals (current goals can now be found in the care plan section) Progress towards PT goals: Progressing toward goals    Frequency  Min 3X/week    PT Plan Current plan remains appropriate    Co-evaluation             End of Session Equipment Utilized During Treatment: Other (comment) (left prosthesis, right leg brace) Activity Tolerance: Patient tolerated treatment well Patient left: in chair;with call bell/phone within reach;with family/visitor present     Time: 4132-4401 (lab in during  treatment) PT Time Calculation (min): 21 min  Charges:  $Therapeutic Activity: 8-22 mins                    G CodesShanna Cisco, [place] 11/23/2013, 3:40 PM

## 2013-11-23 NOTE — Progress Notes (Signed)
Family Medicine Teaching Service Daily Progress Note Intern Pager: [phone]  Patient name: Elizabeth [name] Medical record number: [id number] Date of birth: 04/07/1941 Age: 73 y.o. Gender: female  Primary Care Provider: Donetta Potts, MD Consultants: neurology Code Status: full  Pt Overview and Major Events to Date:  3/31: AMS, possible stroke 4/1: Mental status improved; awaiting initial imaging to be completed; Notified her Cardiologist  Assessment and Plan: Elizabeth [name] is a 73 y.o. female presenting with sudden mental status change with concern for acute stroke . PMH is significant for stroke, DM2, hyperlipidemia, HTN, CAD s/p CABG, renal transplant, A. fib on anticoagulation, irritable bowel syndrome, GERD, diastolic CHF.   Acute toxic metabolic encephalopathy- Resolved  - With history of at least 2 strokes, concern for new CVA.  However, MRI/MRA does not show acute infarction.  - Lipid panel (Tchol 118, LDL 39) - Restart Lipitor and ASA - OT: 24 hrs assistance vs SNF, PT: 24 hrs assistance  - Neurology consulted   TTE: EF 65-70 %; Grade 2 diastolic dysfcn, Mild AV regurg, Mod MV & TV regurg  Carotid dopplers: < 40% stenosis b/l   EEG: No epileptic origin   RPR and B12 not cause.  TSH slightly elevated, could be secondary to subclinical hypothyroidism in setting of acute illness vs under treating her Hypothyroidism.  Recheck in outpatient setting   PT to evaluate today, possible d/c if stable   Bladder discomfort   - Pt complained of bladder pain s/p foley removal; Bladder scan 337 (4/1) - I&O cath = 300 - On Flomax - UA showing UTI, will send for UCx and start on Keflex 500 mg TID-QID if tolerates   Hypoxia/Desaturation  - Per nursing started shortly after vomiting, some concern for aspiration  - CXR: Bilateral pleural effusions and bibasilar atelectasis - Off oxygen with good staturation - Cleared by SLP to eat  DM2  - A1c: 6.2;  09/21/2012 6.3  -  Attenuated to 6 units daily, hold home sliding scale, Q4 sliding scale here   CKD s/p renal transplant  - Creatinine 0.93 < 1.2 (3/31) - Restarted antirejection meds 4/1 after cleared by SLP: Tacrolimus, prednisone, mycophenolate  - Nephrology informed  HTN - Intermittent w/ wide pulse pressure - BP's slightly elevated past 24 hrs, continue to monitor and home medications   CAD, S/P Single vessel CABG in 2001, Hx of Afib  - ASA  - Dyspnea w/o chest pain  - EKG with slightly more prominent Q waves in V1/V2 on admission - toponins x 3: Neg - Notified her Cardiologist of admission  Diastolic CHF  - Appears to be overall euvolemic, weight less than her goal dry weight  - Not currently taking diuretics, prescribed by cardiology for weight over 136 so will hold with weight 128 on admission  IBS - holding hyoscyamine GERD- IV PPI  Hypothyroidism - Synthroid: dose 149mcg   FEN/GI: heart healthy diet, NS @ 75cc/hr Prophylaxis: Coumadin per pharm  Disposition: Possible d/c today if PT clearance and ambulation  Subjective:  Denies CP, SOB, dysuria or palpitations.   Objective: Temp:  [97.9 F (36.6 C)-99 F (37.2 C)] 98.7 F (37.1 C) (04/04 0544) Pulse Rate:  [52-60] 54 (04/04 0544) Resp:  [18] 18 (04/04 0544) BP: (151-189)/(45-126) 157/53 mmHg (04/04 0544) SpO2:  [96 %-100 %] 97 % (04/04 0544) Physical Exam: Gen: NAD, alert, cooperative with exam  CV: Distant heart sounds, irregular bradycardia, + 2/6 Early diastolic murmur at RUSB Resp: Breath sounds clear but  diminished throughout Abd: Soft , no tenderness throughout to palpation, no guarding  Ext: 1+ pitting edema on right lower extremity, BKA on the left, right lower leg with 2 large bony calluses about halfway down from the knee   Laboratory:  Recent Labs Lab 11/19/13 1147 11/19/13 1154 11/20/13 0043 11/22/13 0556  WBC 6.5  --  5.3 4.1  HGB 14.4 15.0 12.4 13.5  HCT 44.9 44.0 38.7 41.2  PLT 129*  --  124* 113*     Recent Labs Lab 11/19/13 1147 11/19/13 1154 11/20/13 0043 11/22/13 0556  NA 142 140 141 137  K 4.8 4.5 4.2 4.1  CL 101 97 101 100  CO2 34*  --  29 26  BUN 27* 27* 22 26*  CREATININE 1.00 1.20* 0.93 0.95  CALCIUM 10.0  --  9.0 9.1  PROT 6.0  --   --   --   BILITOT 0.5  --   --   --   ALKPHOS 67  --   --   --   ALT 18  --   --   --   AST 21  --   --   --   GLUCOSE 174* 167* 133* 123*    Recent Labs Lab 11/18/13 11/19/13 1147 11/20/13 0043 11/21/13 0158 11/22/13 0556  INR 1.8 1.78* 1.91* 1.58* 1.49   Urinalysis    Component Value Date/Time   COLORURINE YELLOW 11/22/2013 0846   APPEARANCEUR CLOUDY* 11/22/2013 0846   LABSPEC 1.021 11/22/2013 0846   PHURINE 6.0 11/22/2013 0846   GLUCOSEU NEGATIVE 11/22/2013 0846   HGBUR TRACE* 11/22/2013 0846   BILIRUBINUR NEGATIVE 11/22/2013 0846   KETONESUR 15* 11/22/2013 0846   PROTEINUR 30* 11/22/2013 0846   UROBILINOGEN 1.0 11/22/2013 0846   NITRITE NEGATIVE 11/22/2013 0846   LEUKOCYTESUR MODERATE* 11/22/2013 0846   Imaging/Diagnostic Tests: CT head 11/19/2013  IMPRESSION:  Remote right parietal -occipital lobe infarct with encephalomalacia.  Remote left cerebellar infarct with encephalomalacia. Remote small  right cerebellar infarct.  Small vessel disease type changes.  The basilar artery is slightly dense, more so than on prior exams.  Basilar artery thrombosis cannot be excluded in the proper clinical  setting.  Atrophy.  Ventricular prominence possibly related to central atrophy although  cannot exclude hydrocephalus. This is without change.  Prominent vascular calcifications.   MRI 11/19/2013 - incomplete images do to patient's middle status, collected images were motion degradated   MRI MRA 11/21/13 IMPRESSION:  1. No acute infarct.  2. Remote cerebellar and right parieto-occipital infarcts,  unchanged.  3. Unchanged chronic small vessel ischemic disease and cerebral  atrophy.  4. Severely motion degraded MRA examination without  evidence of  proximal vessel occlusion.   EKG 11/19/2013: Sinus bradycardia Q waves in V1 and V2 Q waves slightly more prominent than last 1 month ago)  Nolon Rod, DO 11/23/2013, 8:55 AM PGY-1, [place] Intern pager: [phone], text pages welcome

## 2013-11-23 NOTE — Progress Notes (Signed)
CALL PAGER [phone] for any questions or notifications regarding this patient   FMTS Attending Daily Note: Dorcas Mcmurray MD  Attending pager:[phone]  office [phone]  I  have seen and examined this patient, reviewed their chart. I have discussed this patient with the resident. I agree with the resident's findings, assessment and care plan. I again spoke at length with her husband at bedside. He still seeks to understand the episode that caused her hospitalization. I think most likely it was some sort of TIA versus some sort of an encephalopathic process. We repeated her urinalysis yesterday which had some very slight indications of possible UTI so were empirically going to treat for that. I certainly don't think that was the cause of her episode. I suspect it was more of a acute encephalopathy. Her EEG did show some generalized slowing which would be consistent with prior stroke as well as other mild encephalopathic processes. I think she's back to baseline or perhaps she has a new baseline. We are going to have physical therapy check with her again today to make sure she can transfer and use her prosthesis appropriately and if so likely we can send her home. Her husband had some questions about what he should do if this occurs again and we discussed. I don't have really very good answers for him area he seems quite cognizant of the situation. I feel like she is in really good hands with his care.

## 2013-11-23 NOTE — Discharge Instructions (Signed)
Elizabeth [name], we saw you in the hospital because of a mental status change. You have improved since when you first got here. We Did imaging which did not show any new stroke. We are providing you with antibiotics because you have a urinary tract infection. Please take this 4 times per day and finish the entire course, even if you feel better. Please make sure to schedule an appointment with your primary care physician, Dr. Meredeth Ide for some time this upcoming week for follow-up.

## 2013-11-23 NOTE — Progress Notes (Signed)
Pt discharged home with spouse, self care. Spouse reports Pt had Home Health PT until discontinued in January due to heel ulcer; that will have to resume when healed and can be reoRdered by her PCP.  All instructions reviewed; Stroke/TIA education handout given, and questions answered.Marland Kitchen

## 2013-11-24 LAB — URINE CULTURE

## 2013-11-25 ENCOUNTER — Telehealth: Payer: Self-pay | Admitting: Family Medicine

## 2013-11-25 MED ORDER — NITROFURANTOIN MONOHYD MACRO 100 MG PO CAPS: 100.0000 mg | ORAL_CAPSULE | Freq: Two times a day (BID) | ORAL | Status: DC

## 2013-11-25 NOTE — Telephone Encounter (Signed)
Called patient. Reviewed UCx, VRE sensitive to macrobid. Patient has taking and tolerated macrobid before while in the nursing home.   Her husband reports that she is weak, but still able to stand with assist.  Currently taking keflex.  Plan switch keflex to macrobid for VRE. Patient to f/u with her PCP.

## 2013-11-25 NOTE — Discharge Summary (Signed)
Family Medicine Teaching Service  Discharge Note : Attending Kniyah Khun MD Pager [phone] Office [phone] I have seen and examined this patient, reviewed their chart and discussed discharge planning wit the resident at the time of discharge. I agree with the discharge plan as above.  

## 2013-11-28 ENCOUNTER — Ambulatory Visit (INDEPENDENT_AMBULATORY_CARE_PROVIDER_SITE_OTHER): Payer: Private Health Insurance - Indemnity | Admitting: Pharmacist Clinician (PhC)/ Clinical Pharmacy Specialist

## 2013-11-28 DIAGNOSIS — Z5181 Encounter for therapeutic drug level monitoring: Secondary | ICD-10-CM

## 2013-11-28 DIAGNOSIS — I4891 Unspecified atrial fibrillation: Secondary | ICD-10-CM

## 2013-11-28 DIAGNOSIS — I82629 Acute embolism and thrombosis of deep veins of unspecified upper extremity: Secondary | ICD-10-CM

## 2013-11-28 DIAGNOSIS — I639 Cerebral infarction, unspecified: Secondary | ICD-10-CM

## 2013-11-28 DIAGNOSIS — I6350 Cerebral infarction due to unspecified occlusion or stenosis of unspecified cerebral artery: Secondary | ICD-10-CM

## 2013-11-28 LAB — POCT INR: INR: 2.7

## 2013-12-02 ENCOUNTER — Other Ambulatory Visit: Payer: Self-pay | Admitting: Cardiovascular Disease

## 2013-12-02 NOTE — Telephone Encounter (Signed)
Please review and refill, Thank You. 

## 2013-12-04 ENCOUNTER — Ambulatory Visit (INDEPENDENT_AMBULATORY_CARE_PROVIDER_SITE_OTHER): Payer: Private Health Insurance - Indemnity | Admitting: Cardiovascular Disease

## 2013-12-04 ENCOUNTER — Telehealth: Payer: Self-pay

## 2013-12-04 DIAGNOSIS — Z5181 Encounter for therapeutic drug level monitoring: Secondary | ICD-10-CM

## 2013-12-04 DIAGNOSIS — I6350 Cerebral infarction due to unspecified occlusion or stenosis of unspecified cerebral artery: Secondary | ICD-10-CM

## 2013-12-04 DIAGNOSIS — I4891 Unspecified atrial fibrillation: Secondary | ICD-10-CM

## 2013-12-04 DIAGNOSIS — I639 Cerebral infarction, unspecified: Secondary | ICD-10-CM

## 2013-12-04 DIAGNOSIS — I82629 Acute embolism and thrombosis of deep veins of unspecified upper extremity: Secondary | ICD-10-CM

## 2013-12-04 LAB — POCT INR: INR: 3.2

## 2013-12-04 NOTE — Telephone Encounter (Signed)
Results noted, see anticoagulation note in Epic.  

## 2013-12-04 NOTE — Telephone Encounter (Signed)
Nurse with [facility] calling PT/INR INR 3.2, PT 38.5

## 2013-12-10 ENCOUNTER — Ambulatory Visit (INDEPENDENT_AMBULATORY_CARE_PROVIDER_SITE_OTHER): Payer: Private Health Insurance - Indemnity | Admitting: Pharmacist

## 2013-12-10 DIAGNOSIS — I4891 Unspecified atrial fibrillation: Secondary | ICD-10-CM

## 2013-12-10 DIAGNOSIS — I82629 Acute embolism and thrombosis of deep veins of unspecified upper extremity: Secondary | ICD-10-CM

## 2013-12-10 DIAGNOSIS — I639 Cerebral infarction, unspecified: Secondary | ICD-10-CM

## 2013-12-10 DIAGNOSIS — Z5181 Encounter for therapeutic drug level monitoring: Secondary | ICD-10-CM

## 2013-12-10 DIAGNOSIS — I6350 Cerebral infarction due to unspecified occlusion or stenosis of unspecified cerebral artery: Secondary | ICD-10-CM

## 2013-12-10 LAB — POCT INR: INR: 1.4

## 2013-12-18 ENCOUNTER — Ambulatory Visit (INDEPENDENT_AMBULATORY_CARE_PROVIDER_SITE_OTHER): Payer: Private Health Insurance - Indemnity | Admitting: Pharmacist

## 2013-12-18 ENCOUNTER — Telehealth: Payer: Self-pay | Admitting: *Deleted

## 2013-12-18 ENCOUNTER — Telehealth: Payer: Self-pay

## 2013-12-18 DIAGNOSIS — Z5181 Encounter for therapeutic drug level monitoring: Secondary | ICD-10-CM

## 2013-12-18 DIAGNOSIS — I6350 Cerebral infarction due to unspecified occlusion or stenosis of unspecified cerebral artery: Secondary | ICD-10-CM

## 2013-12-18 DIAGNOSIS — I4891 Unspecified atrial fibrillation: Secondary | ICD-10-CM

## 2013-12-18 DIAGNOSIS — I82629 Acute embolism and thrombosis of deep veins of unspecified upper extremity: Secondary | ICD-10-CM

## 2013-12-18 DIAGNOSIS — I639 Cerebral infarction, unspecified: Secondary | ICD-10-CM

## 2013-12-18 LAB — POCT INR: INR: 1.5

## 2013-12-18 NOTE — Telephone Encounter (Signed)
Patient notified to increase warfarin to 5 mg qd except 2.5 mg Tuesday and Saturday, and [facility] notified to recheck INR in 1 week.  She denies missing any doses or any major medication changes. See anticoag encounter.

## 2013-12-18 NOTE — Telephone Encounter (Signed)
[facility] INR 1.5

## 2013-12-18 NOTE — Telephone Encounter (Signed)
Vinnie Level, RN from [name] called w/ pt's INR: 1.5. Please advise.  Thank you!

## 2013-12-25 ENCOUNTER — Ambulatory Visit (INDEPENDENT_AMBULATORY_CARE_PROVIDER_SITE_OTHER): Payer: Private Health Insurance - Indemnity | Admitting: Pharmacist

## 2013-12-25 ENCOUNTER — Telehealth: Payer: Self-pay

## 2013-12-25 DIAGNOSIS — I4891 Unspecified atrial fibrillation: Secondary | ICD-10-CM

## 2013-12-25 DIAGNOSIS — I6350 Cerebral infarction due to unspecified occlusion or stenosis of unspecified cerebral artery: Secondary | ICD-10-CM

## 2013-12-25 DIAGNOSIS — I82629 Acute embolism and thrombosis of deep veins of unspecified upper extremity: Secondary | ICD-10-CM

## 2013-12-25 DIAGNOSIS — I639 Cerebral infarction, unspecified: Secondary | ICD-10-CM

## 2013-12-25 DIAGNOSIS — Z5181 Encounter for therapeutic drug level monitoring: Secondary | ICD-10-CM

## 2013-12-25 LAB — POCT INR: INR: 1.7

## 2013-12-25 NOTE — Telephone Encounter (Signed)
Called and addressed

## 2013-12-25 NOTE — Telephone Encounter (Signed)
Vinnie Level from [facility] calling w/ pt's INR: 1.7.  She states that she is in the pt's home now and would like a call back by 1:00. Advised her that Doroteo Bradford just left for lunch.  She states that she will call [facility] office if she doesn't get a call in the next 15 mins.

## 2013-12-30 ENCOUNTER — Ambulatory Visit (INDEPENDENT_AMBULATORY_CARE_PROVIDER_SITE_OTHER): Payer: Private Health Insurance - Indemnity | Admitting: Cardiology

## 2013-12-30 DIAGNOSIS — Z5181 Encounter for therapeutic drug level monitoring: Secondary | ICD-10-CM

## 2013-12-30 DIAGNOSIS — I639 Cerebral infarction, unspecified: Secondary | ICD-10-CM

## 2013-12-30 DIAGNOSIS — I82629 Acute embolism and thrombosis of deep veins of unspecified upper extremity: Secondary | ICD-10-CM

## 2013-12-30 DIAGNOSIS — I4891 Unspecified atrial fibrillation: Secondary | ICD-10-CM

## 2013-12-30 DIAGNOSIS — I6350 Cerebral infarction due to unspecified occlusion or stenosis of unspecified cerebral artery: Secondary | ICD-10-CM

## 2013-12-30 LAB — POCT INR: INR: 1.9

## 2014-01-08 ENCOUNTER — Ambulatory Visit (INDEPENDENT_AMBULATORY_CARE_PROVIDER_SITE_OTHER): Payer: Medicare Other | Admitting: Cardiovascular Disease

## 2014-01-08 DIAGNOSIS — I4891 Unspecified atrial fibrillation: Secondary | ICD-10-CM

## 2014-01-08 DIAGNOSIS — Z5181 Encounter for therapeutic drug level monitoring: Secondary | ICD-10-CM

## 2014-01-08 DIAGNOSIS — I82629 Acute embolism and thrombosis of deep veins of unspecified upper extremity: Secondary | ICD-10-CM

## 2014-01-08 DIAGNOSIS — I6350 Cerebral infarction due to unspecified occlusion or stenosis of unspecified cerebral artery: Secondary | ICD-10-CM

## 2014-01-08 DIAGNOSIS — I639 Cerebral infarction, unspecified: Secondary | ICD-10-CM

## 2014-01-08 LAB — POCT INR: INR: 2.1

## 2014-01-22 ENCOUNTER — Ambulatory Visit (INDEPENDENT_AMBULATORY_CARE_PROVIDER_SITE_OTHER): Payer: Private Health Insurance - Indemnity | Admitting: Cardiology

## 2014-01-22 DIAGNOSIS — Z5181 Encounter for therapeutic drug level monitoring: Secondary | ICD-10-CM

## 2014-01-22 DIAGNOSIS — I639 Cerebral infarction, unspecified: Secondary | ICD-10-CM

## 2014-01-22 DIAGNOSIS — I6350 Cerebral infarction due to unspecified occlusion or stenosis of unspecified cerebral artery: Secondary | ICD-10-CM

## 2014-01-22 DIAGNOSIS — I4891 Unspecified atrial fibrillation: Secondary | ICD-10-CM

## 2014-01-22 DIAGNOSIS — I82629 Acute embolism and thrombosis of deep veins of unspecified upper extremity: Secondary | ICD-10-CM

## 2014-01-22 LAB — POCT INR: INR: 2.6

## 2014-02-12 ENCOUNTER — Ambulatory Visit (INDEPENDENT_AMBULATORY_CARE_PROVIDER_SITE_OTHER): Payer: Private Health Insurance - Indemnity | Admitting: Pharmacist

## 2014-02-12 DIAGNOSIS — I6350 Cerebral infarction due to unspecified occlusion or stenosis of unspecified cerebral artery: Secondary | ICD-10-CM

## 2014-02-12 DIAGNOSIS — I639 Cerebral infarction, unspecified: Secondary | ICD-10-CM

## 2014-02-12 DIAGNOSIS — Z5181 Encounter for therapeutic drug level monitoring: Secondary | ICD-10-CM

## 2014-02-12 LAB — POCT INR: INR: 2.8

## 2014-02-26 ENCOUNTER — Other Ambulatory Visit: Payer: Self-pay | Admitting: Dermatology

## 2014-03-05 IMAGING — CT CT MAXILLOFACIAL W/O CM
6 of 12 series · 15 of 47 positions shown, 16 images · non-contrast
Comparison: 11/10/2010

CT HEAD

CLINICAL DATA: Fall.  On Coumadin.  Right facial swelling.

CT HEAD WITHOUT CONTRAST
CT MAXILLOFACIAL WITHOUT CONTRAST
CT CERVICAL SPINE WITHOUT CONTRAST
TECHNIQUE: Multidetector CT imaging of the head, cervical spine,
and maxillofacial structures were performed using the standard
protocol without intravenous contrast. Multiplanar CT image
reconstructions of the cervical spine and maxillofacial structures
were also generated.

[Series 4: head w/o bone · axial · non-contrast · 0.49mm/px · z∈[+282,+334]mm · 2 of 65 slices shown (1 of 2)]
[im 22/65  bone]
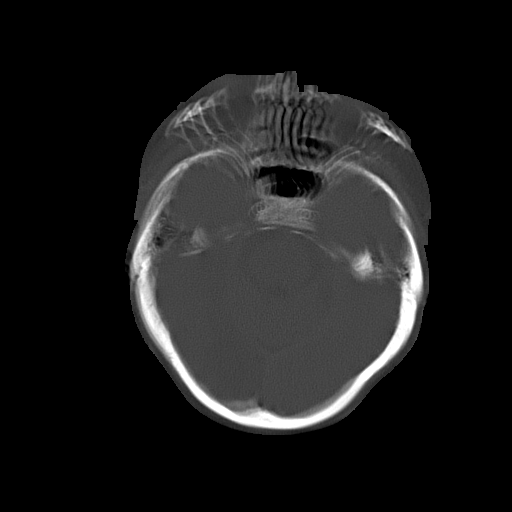
[im 43/65  bone]
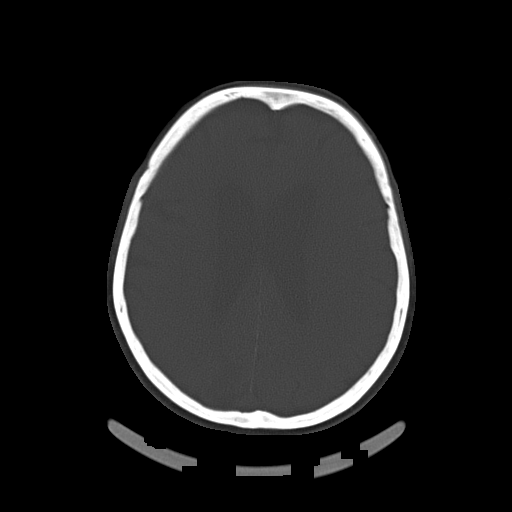

[Series 7: head w/o bone · axial · non-contrast · 0.49mm/px · z∈[+282,+334]mm · 2 of 65 slices shown (2 of 2)]
[im 22/65  bone]
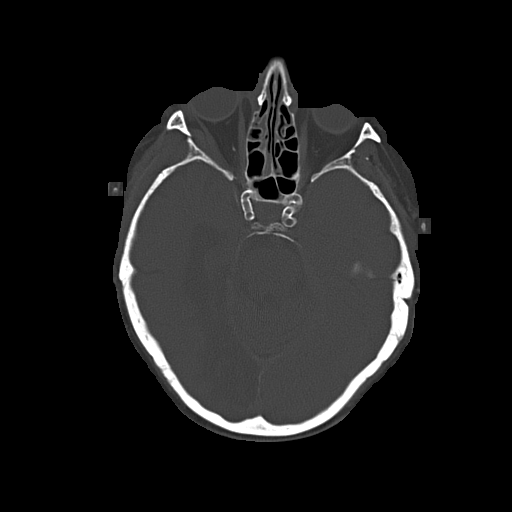
[im 43/65  bone]
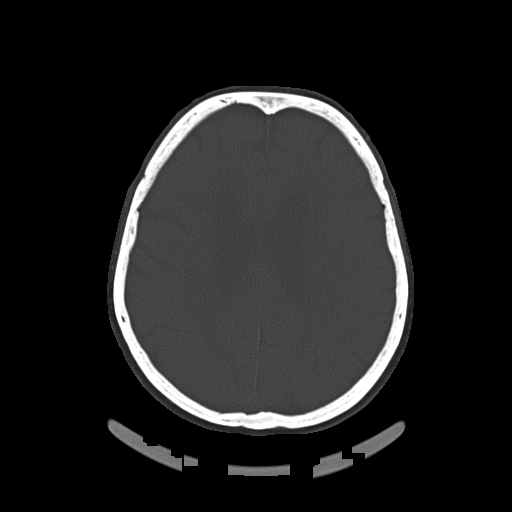

[Series 9: facial bones · axial · 0.37mm/px · z∈[+211,+287]mm · 3 of 78 slices shown]
[im 20/78  bone]
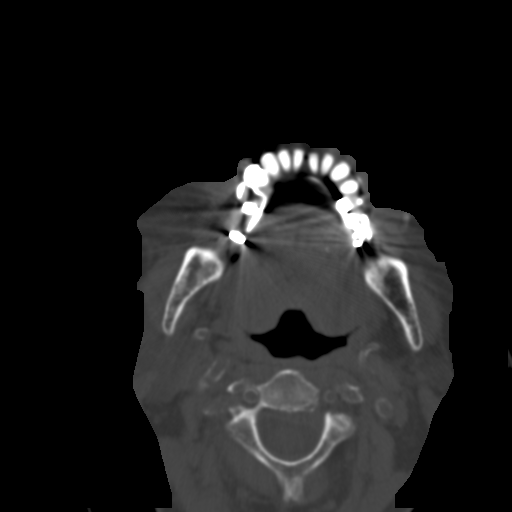
[im 39/78  bone]
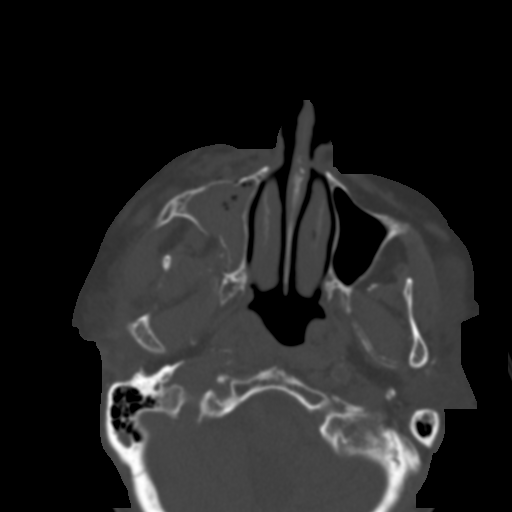
[im 58/78  bone]
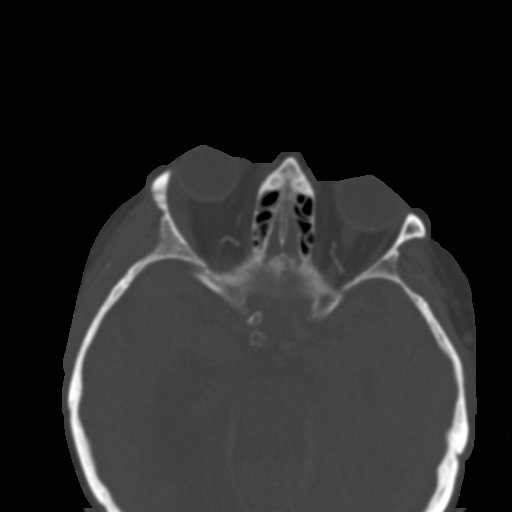

[coronal bone · coronal · 0.36mm/px · 3 of 68 slices shown]
[im 23/68  bone]
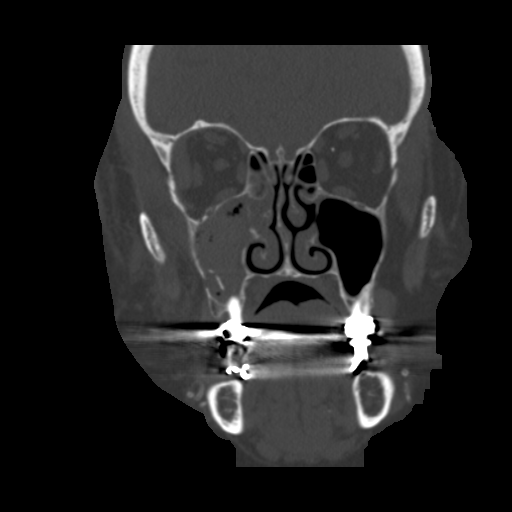
[im 34/68  bone]
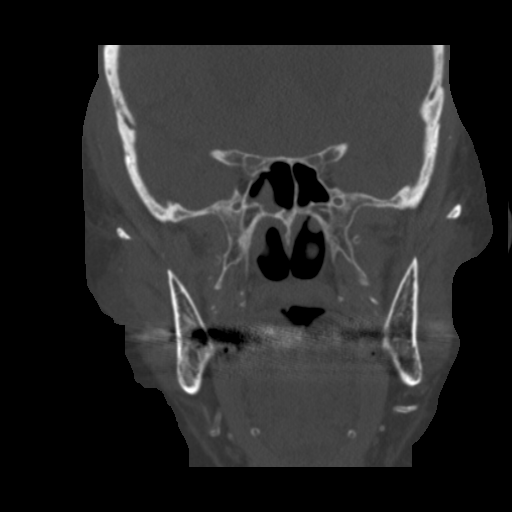
[im 45/68  bone]
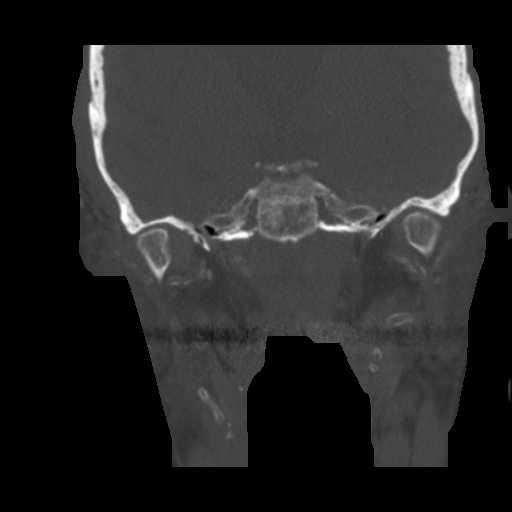

[sag bone · sagittal · 0.36mm/px · 1 of 70 slices shown]
[im 35/70  bone]
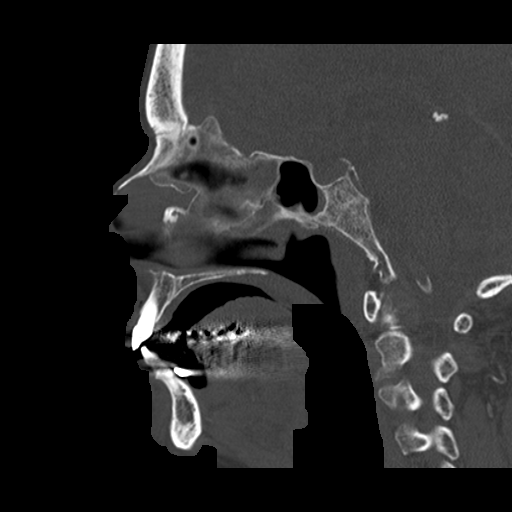

[orthog · axial · 0.30mm/px · z∈[+109,+212]mm · 4 of 89 slices shown, 5 images]
[im 18/89  brain]
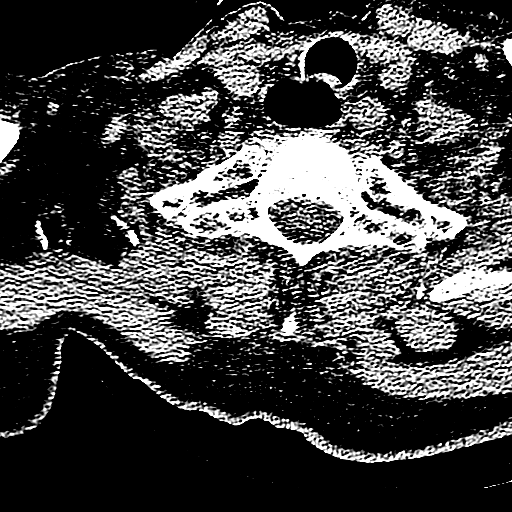
[im 18/89  bone]
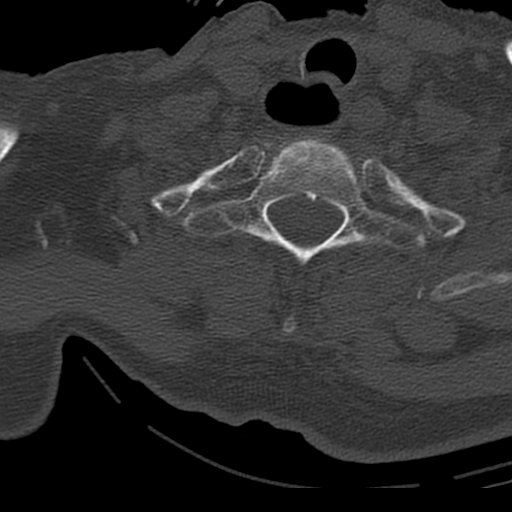
[im 36/89  bone]
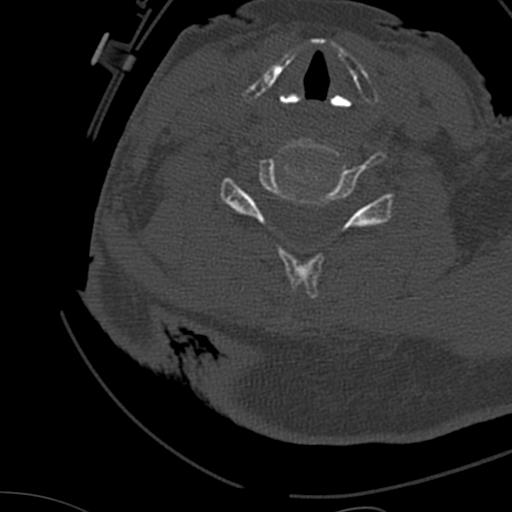
[im 53/89  bone]
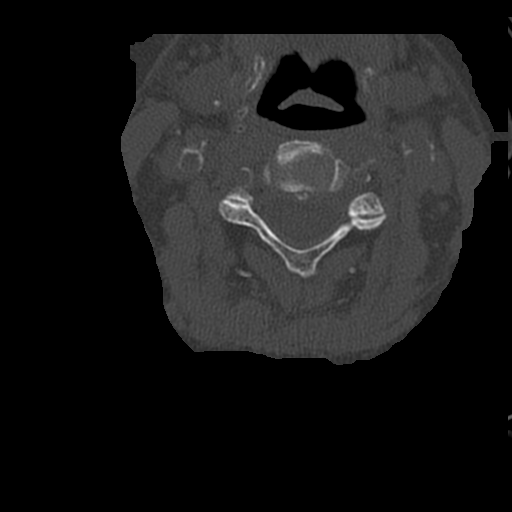
[im 71/89  bone]
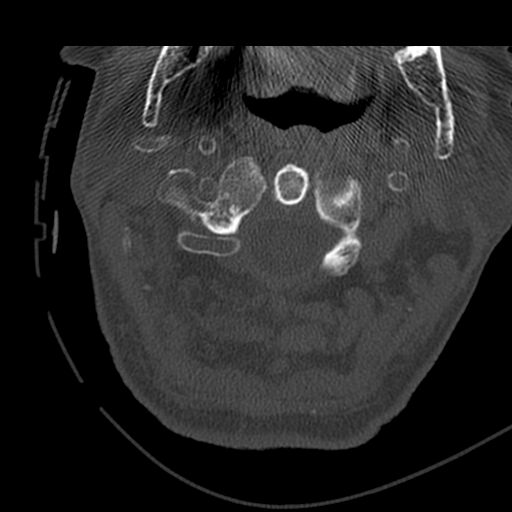

[15 of 47 positions shown; findings below may reference images not displayed]

FINDINGS: Large old right posterior circulation infarct with
encephalomalacia in the right occipital and medial posterior
temporal lobe.  Old left cerebellar infarct.  No acute infarction.
No hemorrhage or hydrocephalus.  No acute calvarial abnormality.
IMPRESSION: No acute intracranial abnormality.

CT MAXILLOFACIAL
FINDINGS: Fractures noted through the anterior and lateral walls
of the right maxillary sinus.  Blood within the maxillary sinus.
The fracture extends through the inferior orbital rim.  Two
fractures are noted within the mid and posterior right zygomatic
arch.  Fracture or diastasis in the lateral right orbital wall.

Mandible is intact.  Blood noted within the right paranasal
sinuses.  Soft tissue swelling within the left face.  Orbital soft
tissues grossly unremarkable.
IMPRESSION: Fractures through the anterior and lateral walls of the right
maxillary sinus, extending to involve the right inferior orbital
rim.  Fractures within the right zygomatic arch and right lateral
orbital wall.

Blood throughout the right paranasal sinuses.

CT CERVICAL SPINE
FINDINGS: Normal alignment.  Prevertebral soft tissues are
normal.  Disc spaces are maintained.  No fracture.  No epidural or
paraspinal hematoma..  Densely calcified vessels throughout the
neck.
IMPRESSION: No acute bony abnormality.

## 2014-03-12 ENCOUNTER — Telehealth: Payer: Self-pay | Admitting: *Deleted

## 2014-03-12 ENCOUNTER — Ambulatory Visit (INDEPENDENT_AMBULATORY_CARE_PROVIDER_SITE_OTHER): Payer: Private Health Insurance - Indemnity | Admitting: Cardiovascular Disease

## 2014-03-12 DIAGNOSIS — I6350 Cerebral infarction due to unspecified occlusion or stenosis of unspecified cerebral artery: Secondary | ICD-10-CM

## 2014-03-12 DIAGNOSIS — Z5181 Encounter for therapeutic drug level monitoring: Secondary | ICD-10-CM

## 2014-03-12 DIAGNOSIS — I639 Cerebral infarction, unspecified: Secondary | ICD-10-CM

## 2014-03-12 DIAGNOSIS — I4891 Unspecified atrial fibrillation: Secondary | ICD-10-CM

## 2014-03-12 LAB — POCT INR: INR: 2.3

## 2014-03-12 NOTE — Telephone Encounter (Signed)
Returned call to [place], INR results addressed.  See anticoagulation note in Epic.

## 2014-03-12 NOTE — Telephone Encounter (Signed)
Elizabeth [name] (home health nurse) called and the patient INR 2.3. Please call before she leaves at 11:15

## 2014-03-26 ENCOUNTER — Encounter: Payer: Self-pay | Admitting: Internal Medicine

## 2014-04-23 ENCOUNTER — Ambulatory Visit (INDEPENDENT_AMBULATORY_CARE_PROVIDER_SITE_OTHER): Payer: Private Health Insurance - Indemnity | Admitting: Cardiology

## 2014-04-23 DIAGNOSIS — I639 Cerebral infarction, unspecified: Secondary | ICD-10-CM

## 2014-04-23 DIAGNOSIS — I6350 Cerebral infarction due to unspecified occlusion or stenosis of unspecified cerebral artery: Secondary | ICD-10-CM

## 2014-04-23 DIAGNOSIS — I4891 Unspecified atrial fibrillation: Secondary | ICD-10-CM

## 2014-04-23 DIAGNOSIS — Z5181 Encounter for therapeutic drug level monitoring: Secondary | ICD-10-CM

## 2014-04-23 LAB — POCT INR: INR: 2.9

## 2014-04-30 ENCOUNTER — Other Ambulatory Visit: Payer: Self-pay | Admitting: Dermatology

## 2014-06-04 ENCOUNTER — Ambulatory Visit (INDEPENDENT_AMBULATORY_CARE_PROVIDER_SITE_OTHER): Payer: Medicare Other | Admitting: Cardiovascular Disease

## 2014-06-04 DIAGNOSIS — Z5181 Encounter for therapeutic drug level monitoring: Secondary | ICD-10-CM

## 2014-06-04 DIAGNOSIS — I639 Cerebral infarction, unspecified: Secondary | ICD-10-CM

## 2014-06-04 DIAGNOSIS — I4891 Unspecified atrial fibrillation: Secondary | ICD-10-CM

## 2014-06-04 LAB — POCT INR: INR: 3.7

## 2014-06-18 ENCOUNTER — Ambulatory Visit (INDEPENDENT_AMBULATORY_CARE_PROVIDER_SITE_OTHER): Payer: Medicare Other

## 2014-06-18 ENCOUNTER — Encounter: Payer: Self-pay | Admitting: Cardiovascular Disease

## 2014-06-18 ENCOUNTER — Ambulatory Visit (INDEPENDENT_AMBULATORY_CARE_PROVIDER_SITE_OTHER): Payer: Medicare Other | Admitting: Cardiovascular Disease

## 2014-06-18 VITALS — BP 108/60 | HR 45 | Ht 64.0 in | Wt 127.5 lb

## 2014-06-18 DIAGNOSIS — Z5181 Encounter for therapeutic drug level monitoring: Secondary | ICD-10-CM

## 2014-06-18 DIAGNOSIS — E785 Hyperlipidemia, unspecified: Secondary | ICD-10-CM

## 2014-06-18 DIAGNOSIS — I5031 Acute diastolic (congestive) heart failure: Secondary | ICD-10-CM

## 2014-06-18 DIAGNOSIS — I4891 Unspecified atrial fibrillation: Secondary | ICD-10-CM

## 2014-06-18 DIAGNOSIS — I2510 Atherosclerotic heart disease of native coronary artery without angina pectoris: Secondary | ICD-10-CM

## 2014-06-18 DIAGNOSIS — I951 Orthostatic hypotension: Secondary | ICD-10-CM

## 2014-06-18 DIAGNOSIS — E119 Type 2 diabetes mellitus without complications: Secondary | ICD-10-CM

## 2014-06-18 DIAGNOSIS — I495 Sick sinus syndrome: Secondary | ICD-10-CM

## 2014-06-18 DIAGNOSIS — I639 Cerebral infarction, unspecified: Secondary | ICD-10-CM

## 2014-06-18 LAB — POCT INR: INR: 2.8

## 2014-06-18 MED ORDER — WARFARIN SODIUM 2.5 MG PO TABS: ORAL_TABLET | ORAL | Status: DC

## 2014-06-18 MED ORDER — MIDODRINE HCL 10 MG PO TABS: 10.0000 mg | ORAL_TABLET | Freq: Three times a day (TID) | ORAL | Status: DC | PRN

## 2014-06-18 NOTE — Assessment & Plan Note (Signed)
Weight has been relatively stable. Recommended if there is significant weight gain concerning for fluid overload, torsemide could be used when necessary

## 2014-06-18 NOTE — Assessment & Plan Note (Signed)
Hemoglobin A1c well controlled.   

## 2014-06-18 NOTE — Assessment & Plan Note (Signed)
Cholesterol is at goal on the current lipid regimen. No changes to the medications were made.  

## 2014-06-18 NOTE — Patient Instructions (Addendum)
Your next appointment will be scheduled in our new office located at :  [facility]  [address] [place], [place] [address]  You are doing well.  Please use midodrine as needed in the Am for symptomatic hypotension  Please call us if you have new issues that need to be addressed before your next appt.  Your physician wants you to follow-up in: 6 months.  You will receive a reminder letter in the mail two months in advance. If you don't receive a letter, please call our office to schedule the follow-up appointment.

## 2014-06-18 NOTE — Assessment & Plan Note (Signed)
We will provide her with a prescription for midodrine 5 up to 10 mg as needed. Husband reports she has significant symptoms in the morning. Later in the day typically she does much better. She will continue on Florinef 0.1 mg daily

## 2014-06-18 NOTE — Assessment & Plan Note (Signed)
Long history of sinus bradycardia. In general she has been asymptomatic. If there is a clear correlation between her rate and hypotension, pacemaker could be considered. She and her husband were not particularly interested at this time as she is doing well with blood pressure support with Florinef

## 2014-06-18 NOTE — Progress Notes (Signed)
Patient ID: Elizabeth [name], female    DOB: [birth year], 73 y.o.   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 Florinef was started earlier in 2012 with improvement of her symptoms and the ability to work with physical therapy.  history of aortic atherosclerosis, hyperlipidemia, labile blood pressures  She has vision problems, continues to have balance problems, and was told that she has vascular dementia. Previous diagnosis of yeast esophagitis, being treated with fluconazole.  On a prior clinic visit in early 2015, weight was up to 170 pounds, she had massive fluid retention, leg edema. She reported that Lasix was not helping her symptoms.  previous Chest x-ray showed bilateral pleural effusions.   She was started on torsemide 40 mg daily and has had significant weight loss. Weight was stable at around 132 pounds and she was holding the torsemide  Overall she was feeling much better  In followup today, her husband reports that her weight continued to drop, blood pressure dropped and she has restarted Florinef 0.1 mg daily. As she was not having enough blood pressure support, dose was increased up to 0.2 mg daily. This is on hold now as blood pressure and weight did climb Her husband reports that blood pressure has run in the 448 systolic range sitting . She does have  orthostasis in the morning, rest of the day is better   overall she reports that she feels well. She was relatively nonverbal through much of the visit but pleasant.  Recent lab work showing normal LFTs, hemoglobin A1c 6.1, total cholesterol 138, LDL 54, creatinine 1.01 with BUN 30  Previous Echo several years ago showed EF 50-55% with mild AI.     Carotid u/s 11/09. normal.    EKG shows sinus bradycardia with rate 45 beats per minute, nonspecific ST abnormality  Outpatient Encounter Prescriptions as of 06/18/2014  Medication Sig  . atorvastatin (LIPITOR) 10 MG tablet Take 1 tablet (10 mg total) by mouth every other day.  . cholecalciferol (VITAMIN D) 1000 UNITS tablet Take 2,000 Units by mouth daily.   Marland Kitchen docusate sodium (COLACE) 100 MG capsule Take 100 mg by mouth at bedtime.   Marland Kitchen escitalopram (LEXAPRO) 10 MG tablet Take 10 mg by mouth daily.  . ferrous sulfate (SM IRON) 325 (65 FE) MG tablet Take 6 mg by mouth daily.   Marland Kitchen HYDROcodone-acetaminophen (NORCO/VICODIN) 5-325 MG per tablet Take 2 tablets by mouth 2 (two) times daily as needed for moderate pain.   . hyoscyamine (ANASPAZ) 0.125 MG TBDP disintergrating tablet Place 0.125 mg under the tongue as needed for cramping.   . insulin glargine (LANTUS) 100 UNIT/ML injection Inject 9 Units into the skin every morning.   . insulin lispro (HUMALOG) 100 UNIT/ML injection Inject 2-9 Units into the skin 4 (four) times daily -  before meals and at bedtime. Per sliding scale.  . levothyroxine (SYNTHROID, LEVOTHROID) 175 MCG tablet Take 175 mcg by mouth daily.   Marland Kitchen  Magnesium 250 MG TABS Take 800 mg by mouth 2 (two) times daily.   . mirtazapine (REMERON) 7.5 MG tablet Take 7.5 mg by mouth at bedtime.   . Multiple Vitamin (MULTIVITAMIN WITH MINERALS) TABS Take 1 tablet by mouth daily.  . mycophenolate (MYFORTIC) 360 MG TBEC Take 360 mg by mouth 2 (two) times daily.   . nitroGLYCERIN (NITROSTAT) 0.4 MG SL tablet Place 1 tablet (0.4 mg total) under the  tongue every 5 (five) minutes as needed for chest pain.  Marland Kitchen omeprazole (PRILOSEC) 20 MG capsule Take 20 mg by mouth at bedtime.   . ondansetron (ZOFRAN) 4 MG tablet Take 4 mg by mouth once.  . potassium chloride (K-DUR) 10 MEQ tablet Take 10 mEq by mouth daily at 12 noon.   . predniSONE (DELTASONE) 5 MG tablet Take 5 mg by mouth every morning.   . promethazine (PHENERGAN) 12.5 MG tablet Take 12.5 mg by mouth as needed for nausea or vomiting.   . tacrolimus (PROGRAF) 0.5 MG capsule Take 1-2 mg by mouth 2 (two) times daily. Take 2mg  in the am & 1mg  at night  . tamsulosin (FLOMAX) 0.4 MG CAPS capsule Take 0.4 mg by mouth daily.  Marland Kitchen warfarin (COUMADIN) 2.5 MG tablet Take as directed by [facility]    Review of Systems  Constitutional: Positive for unexpected weight change.  HENT: Negative.   Eyes: Negative.   Respiratory: Negative.   Cardiovascular: Negative.   Gastrointestinal: Negative.   Endocrine: Negative.   Musculoskeletal: Positive for gait problem.  Skin: Negative.   Allergic/Immunologic: Negative.   Neurological: Negative.   Hematological: Negative.   Psychiatric/Behavioral: Negative.   All other systems reviewed and are negative.   BP 108/60  Pulse 45  Ht 5\' 4"  (1.626 m)  Wt 127 lb 8 oz (57.834 kg)  BMI 21.87 kg/m2  Physical Exam  Nursing note and vitals reviewed. Constitutional: She is oriented to person, place, and time. She appears well-developed and well-nourished.   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   HENT:  Head: Normocephalic.  Nose: Nose normal.  Mouth/Throat: Oropharynx is clear and moist.  Eyes: Conjunctivae are normal. Pupils are equal, round, and reactive to light.  Neck: Normal range of motion. Neck supple. No JVD present.  Cardiovascular: Normal rate, regular rhythm, S1 normal, S2 normal, normal heart sounds and intact distal pulses.  Exam reveals no gallop and no friction rub.   No murmur heard. Trace edema of the right lower  extremity  Pulmonary/Chest: Effort normal and breath sounds normal. No respiratory distress. She has no wheezes. She has no rales. She exhibits no tenderness.  dullness at the bases bilaterally  Abdominal: Soft. Bowel sounds are normal. She exhibits no distension. There is no tenderness.  Musculoskeletal: Normal range of motion. She exhibits no edema and no tenderness.  Lymphadenopathy:    She has no cervical adenopathy.  Neurological: She is alert and oriented to person, place, and time. Coordination normal.  Skin: Skin is warm and dry. No rash noted. No erythema.  Psychiatric: She has a normal mood and affect. Her behavior is normal. Judgment and thought content normal.    Assessment and Plan

## 2014-07-16 ENCOUNTER — Ambulatory Visit (INDEPENDENT_AMBULATORY_CARE_PROVIDER_SITE_OTHER): Payer: Medicare Other

## 2014-07-16 DIAGNOSIS — I4891 Unspecified atrial fibrillation: Secondary | ICD-10-CM

## 2014-07-16 DIAGNOSIS — Z5181 Encounter for therapeutic drug level monitoring: Secondary | ICD-10-CM

## 2014-07-16 DIAGNOSIS — I639 Cerebral infarction, unspecified: Secondary | ICD-10-CM

## 2014-07-16 LAB — POCT INR: INR: 2.4

## 2014-08-06 ENCOUNTER — Ambulatory Visit (INDEPENDENT_AMBULATORY_CARE_PROVIDER_SITE_OTHER): Payer: Medicare Other

## 2014-08-06 DIAGNOSIS — Z5181 Encounter for therapeutic drug level monitoring: Secondary | ICD-10-CM

## 2014-08-06 DIAGNOSIS — I4891 Unspecified atrial fibrillation: Secondary | ICD-10-CM

## 2014-08-06 DIAGNOSIS — I639 Cerebral infarction, unspecified: Secondary | ICD-10-CM

## 2014-08-06 LAB — POCT INR: INR: 2.5

## 2014-08-13 ENCOUNTER — Inpatient Hospital Stay: Payer: Self-pay | Admitting: Internal Medicine

## 2014-08-13 ENCOUNTER — Ambulatory Visit: Payer: Self-pay | Admitting: Ophthalmology

## 2014-08-13 LAB — URINALYSIS, COMPLETE
BILIRUBIN, UR: NEGATIVE
Blood: NEGATIVE
GLUCOSE, UR: NEGATIVE mg/dL (ref 0–75)
KETONE: NEGATIVE
NITRITE: NEGATIVE
PH: 5 (ref 4.5–8.0)
Protein: 100
Specific Gravity: 1.023 (ref 1.003–1.030)
WBC UR: 27 /HPF (ref 0–5)

## 2014-08-13 LAB — COMPREHENSIVE METABOLIC PANEL
ANION GAP: 2 — AB (ref 7–16)
Albumin: 2.9 g/dL — ABNORMAL LOW (ref 3.4–5.0)
Alkaline Phosphatase: 68 U/L
BUN: 32 mg/dL — AB (ref 7–18)
Bilirubin,Total: 0.4 mg/dL (ref 0.2–1.0)
CALCIUM: 9 mg/dL (ref 8.5–10.1)
Chloride: 104 mmol/L (ref 98–107)
Co2: 37 mmol/L — ABNORMAL HIGH (ref 21–32)
Creatinine: 1.29 mg/dL (ref 0.60–1.30)
EGFR (Non-African Amer.): 43 — ABNORMAL LOW
GFR CALC AF AMER: 52 — AB
GLUCOSE: 208 mg/dL — AB (ref 65–99)
Osmolality: 298 (ref 275–301)
Potassium: 4.9 mmol/L (ref 3.5–5.1)
SGOT(AST): 24 U/L (ref 15–37)
SGPT (ALT): 30 U/L
SODIUM: 143 mmol/L (ref 136–145)
Total Protein: 5.8 g/dL — ABNORMAL LOW (ref 6.4–8.2)

## 2014-08-13 LAB — CBC
HCT: 37.2 % (ref 35.0–47.0)
HGB: 12 g/dL (ref 12.0–16.0)
MCH: 29.8 pg (ref 26.0–34.0)
MCHC: 32.4 g/dL (ref 32.0–36.0)
MCV: 92 fL (ref 80–100)
PLATELETS: 160 10*3/uL (ref 150–440)
RBC: 4.03 10*6/uL (ref 3.80–5.20)
RDW: 15 % — ABNORMAL HIGH (ref 11.5–14.5)
WBC: 4.2 10*3/uL (ref 3.6–11.0)

## 2014-08-13 LAB — APTT: Activated PTT: 40.1 secs — ABNORMAL HIGH (ref 23.6–35.9)

## 2014-08-13 LAB — PROTIME-INR
INR: 2.7
PROTHROMBIN TIME: 27.8 s — AB (ref 11.5–14.7)

## 2014-08-13 LAB — SEDIMENTATION RATE: Erythrocyte Sed Rate: 10 mm/hr (ref 0–30)

## 2014-08-14 DIAGNOSIS — I340 Nonrheumatic mitral (valve) insufficiency: Secondary | ICD-10-CM

## 2014-08-14 LAB — BASIC METABOLIC PANEL
Anion Gap: 1 — ABNORMAL LOW (ref 7–16)
BUN: 33 mg/dL — ABNORMAL HIGH (ref 7–18)
CREATININE: 1.22 mg/dL (ref 0.60–1.30)
Calcium, Total: 8.6 mg/dL (ref 8.5–10.1)
Chloride: 106 mmol/L (ref 98–107)
Co2: 37 mmol/L — ABNORMAL HIGH (ref 21–32)
EGFR (African American): 55 — ABNORMAL LOW
EGFR (Non-African Amer.): 46 — ABNORMAL LOW
Glucose: 82 mg/dL (ref 65–99)
Osmolality: 293 (ref 275–301)
Potassium: 4.1 mmol/L (ref 3.5–5.1)
SODIUM: 144 mmol/L (ref 136–145)

## 2014-08-14 LAB — LIPID PANEL
Cholesterol: 115 mg/dL (ref 0–200)
HDL Cholesterol: 59 mg/dL (ref 40–60)
Ldl Cholesterol, Calc: 44 mg/dL (ref 0–100)
Triglycerides: 62 mg/dL (ref 0–200)
VLDL Cholesterol, Calc: 12 mg/dL (ref 5–40)

## 2014-08-14 LAB — CBC WITH DIFFERENTIAL/PLATELET
BASOS PCT: 0.4 %
Basophil #: 0 10*3/uL (ref 0.0–0.1)
EOS PCT: 0.7 %
Eosinophil #: 0 10*3/uL (ref 0.0–0.7)
HCT: 32.6 % — ABNORMAL LOW (ref 35.0–47.0)
HGB: 10.5 g/dL — ABNORMAL LOW (ref 12.0–16.0)
LYMPHS PCT: 27.7 %
Lymphocyte #: 1.2 10*3/uL (ref 1.0–3.6)
MCH: 30.3 pg (ref 26.0–34.0)
MCHC: 32.3 g/dL (ref 32.0–36.0)
MCV: 94 fL (ref 80–100)
MONOS PCT: 10.3 %
Monocyte #: 0.4 x10 3/mm (ref 0.2–0.9)
NEUTROS ABS: 2.6 10*3/uL (ref 1.4–6.5)
Neutrophil %: 60.9 %
Platelet: 149 10*3/uL — ABNORMAL LOW (ref 150–440)
RBC: 3.47 10*6/uL — AB (ref 3.80–5.20)
RDW: 15 % — ABNORMAL HIGH (ref 11.5–14.5)
WBC: 4.3 10*3/uL (ref 3.6–11.0)

## 2014-08-14 LAB — PROTIME-INR
INR: 2.7
Prothrombin Time: 28.1 secs — ABNORMAL HIGH (ref 11.5–14.7)

## 2014-08-15 LAB — PROTIME-INR
INR: 3.2
Prothrombin Time: 31.5 secs — ABNORMAL HIGH (ref 11.5–14.7)

## 2014-09-01 DIAGNOSIS — E039 Hypothyroidism, unspecified: Secondary | ICD-10-CM | POA: Insufficient documentation

## 2014-09-03 ENCOUNTER — Ambulatory Visit (INDEPENDENT_AMBULATORY_CARE_PROVIDER_SITE_OTHER): Payer: Medicare Other

## 2014-09-03 DIAGNOSIS — I639 Cerebral infarction, unspecified: Secondary | ICD-10-CM

## 2014-09-03 DIAGNOSIS — I4891 Unspecified atrial fibrillation: Secondary | ICD-10-CM

## 2014-09-03 DIAGNOSIS — Z5181 Encounter for therapeutic drug level monitoring: Secondary | ICD-10-CM

## 2014-09-03 LAB — POCT INR: INR: 3

## 2014-10-10 ENCOUNTER — Other Ambulatory Visit: Payer: Self-pay | Admitting: Dermatology

## 2014-10-15 ENCOUNTER — Ambulatory Visit (INDEPENDENT_AMBULATORY_CARE_PROVIDER_SITE_OTHER): Payer: Medicare Other

## 2014-10-15 DIAGNOSIS — I639 Cerebral infarction, unspecified: Secondary | ICD-10-CM

## 2014-10-15 DIAGNOSIS — I4891 Unspecified atrial fibrillation: Secondary | ICD-10-CM

## 2014-10-15 DIAGNOSIS — Z5181 Encounter for therapeutic drug level monitoring: Secondary | ICD-10-CM

## 2014-10-15 LAB — POCT INR: INR: 4.5

## 2014-10-23 ENCOUNTER — Other Ambulatory Visit: Payer: Self-pay | Admitting: Dermatology

## 2014-10-29 ENCOUNTER — Ambulatory Visit (INDEPENDENT_AMBULATORY_CARE_PROVIDER_SITE_OTHER): Payer: Medicare Other

## 2014-10-29 DIAGNOSIS — Z5181 Encounter for therapeutic drug level monitoring: Secondary | ICD-10-CM

## 2014-10-29 DIAGNOSIS — I4891 Unspecified atrial fibrillation: Secondary | ICD-10-CM | POA: Diagnosis not present

## 2014-10-29 DIAGNOSIS — I639 Cerebral infarction, unspecified: Secondary | ICD-10-CM | POA: Diagnosis not present

## 2014-10-29 LAB — POCT INR: INR: 2.1

## 2014-11-19 ENCOUNTER — Ambulatory Visit (INDEPENDENT_AMBULATORY_CARE_PROVIDER_SITE_OTHER): Payer: Medicare Other

## 2014-11-19 DIAGNOSIS — Z5181 Encounter for therapeutic drug level monitoring: Secondary | ICD-10-CM | POA: Diagnosis not present

## 2014-11-19 DIAGNOSIS — I639 Cerebral infarction, unspecified: Secondary | ICD-10-CM | POA: Diagnosis not present

## 2014-11-19 DIAGNOSIS — I4891 Unspecified atrial fibrillation: Secondary | ICD-10-CM | POA: Diagnosis not present

## 2014-11-19 LAB — POCT INR: INR: 2.6

## 2014-12-12 NOTE — Consult Note (Signed)
PATIENT NAME:  Elizabeth [name], Elizabeth [name] MR#:  [id number] DATE OF BIRTH:  Feb 20, 1941  DATE OF CONSULTATION:  11/15/2012  CONSULTING PHYSICIAN:  Laurene Footman, MD  ATTENDING PHYSICIAN:  [facility]  DATE OF PROCEDURE: 11/15/2012.   REASON FOR CONSULTATION: Lumbar compression fracture.   HISTORY OF PRESENT ILLNESS: The patient is a very fragile 74 year old. She suffered a fall at home and came to the Emergency Room where she was unable to ambulate. She has significant medical problems including diabetes, peripheral neuropathy, Charcot joints. She has had a prior renal transplant secondary to this as well. She is on Coumadin for significant vascular disease and has had a left below-knee amputation and wears a prosthesis. When I initially saw her yesterday she was very confused and was somewhat incoherent secondary to pain and pain medications. Subsequently speaking with her family, prior to this injury she has been a household ambulator with a walker, and on further discussions she has not had any apparent significant back problems in the past with all her other medical problems.   On exam she is very tender at L4 to percussion. Her skin is intact in the back.   Peripheral neurologic exam shows diminished sensation in the right lower extremity, with a prior healed left below-knee amputation.   Her CT scan shows an L4 fracture with minimal retropulsion.   CLINICAL IMPRESSION: Acute L4 compression fracture.   PLAN: I had a lengthy discussion with her family yesterday afternoon after they had come in. I reviewed the kyphoplasty procedure. I think with her medical problems and the need to reverse her anticoagulation we want to try conservative therapy initially, and a brace has been ordered.  I discussed that if her pain persists and is intractable, a kyphoplasty might be possible but I would want to get input from Dr. Rockey Situ, her cardiologist, whether it is safe to have her off her Coumadin. There would  also be an increased risk of infection with her diabetes and antirejection medication. With all this taken into consideration my recommendation is initial nonoperative treatment, and I hope we can mobilize her and have adequate pain control with brace and pain medication. Kyphoplasty would be an option in the future if we cannot mobilize her secondary to severe pain.       ____________________________ Laurene Footman, MD mjm:[id number] D: 11/15/2012 07:36:00 ET T: 11/15/2012 07:57:06 ET JOB#: [id number]  cc: Laurene Footman, MD, <Dictator> Laurene Footman MD ELECTRONICALLY SIGNED 11/15/2012 12:24

## 2014-12-12 NOTE — Discharge Summary (Signed)
PATIENT NAME:  Elizabeth [name], Elizabeth [name] MR#:  [id number] DATE OF BIRTH:  07-08-1941  DATE OF ADMISSION:  11/14/2012 DATE OF DISCHARGE:  11/21/2012  ADMITTING DIAGNOSIS: Fall and confusion.   DISCHARGE DIAGNOSES: 1.  Fall due to mechanical reasons. 2.  Back pain due to fall with L4 compression fracture, status post evaluation by ortho. They recommended brace.  3.  Acute confusion due to delirium as a result of urinary tract infection as well as acute hospitalization and medications.  4.  Urinary tract infection due to Enterococcus faecalis.   5.  History of atrial fibrillation, on chronic anticoagulation. Coumadin dosage had to be adjusted.  6.  Nausea, possibly due to pain medications as well as possibly related to irritable bowel syndrome flare. No evidence of obstruction.  7.  Status post kidney transplant, on Prograf and Myfortic. 8.  Diabetes, insulin-dependent. Blood glucose fluctuating.  9.  History of peripheral vascular disease, status post amputation.  10.  Status post left below-knee amputation for Charcot joint.  11.  History of Charcot joint with multiple falls with tibial and fibular fractures. Currently in a support boot.  12.  Hypothyroidism.  13.  Coronary artery disease, status post one vessel coronary artery bypass graft.  14.  Chronic kidney disease.  15.  Peripheral neuropathy.  16.  History of multiple surgeries for tibial and fibular fractures.  17.  Status post hysterectomy.  18.  Status post total thyroidectomy.  19.  Coronary artery bypass graft.  20.  Status post kidney transplant.   CONSULTANTS: Dr. Rudene Christians.   PERTINENT LABS AND EVALUATIONS: Glucose on admission was 138, BUN 20, creatinine 0.95, sodium 137, potassium 4.0, chloride 101, CO2 30, and calcium was 9.7. LFTs were normal. Admitting WBC 12.6, hemoglobin 12.2 and platelet count 176. INR was 2.6. Urine showed Enterococcus faecalis, greater than 100,000, resistant to Cipro and levofloxacin, ampicillin sensitive.   UA showed 2+ leukocytes.   CT scan of the lumbar spine showed L4 compression fracture with loss of height of 20 to 25%. Diffuse degenerative changes.   HOSPITAL COURSE: Please refer to the H and P as dictated by the admitting physician. The patient is a 74 year old with multiple medical problems who has history of recurrent falls, brought to the ED after having a fall at home. The patient also had confusion at home. She was complaining also of back pain. Due to these symptoms, she underwent evaluation and was found to have a lumbar fracture. She was seen in consultation by ortho who recommended a brace. The patient had difficulty keeping the brace on. She was not felt a good candidate for any surgical intervention and the vertebral height loss was only 20%. The patient has had issues with pain control, but is much better. She also has had intermittent episodes of agitation and delirium that is improved. She also has had complaints of abdominal pain. Abdominal x-rays failed to reveal any abnormality. She has had bowel movements. At this time, she needs further rehab and treatment. She is being arranged to be discharged to rehab.   DISCHARGE MEDICATIONS:  1.  Prograf 2 mg once a day in the morning, 1 mg in the evening/ 2.  Prednisone 5 mg daily. 3.  Synthroid 150 mg on Monday, Tuesday, Wednesday, Thursday and Friday. 4.  Synthroid 175 mcg on Saturday and Sunday. 5.  Lipitor 10 mg daily. 6.  Florinef 0.1 mg 1 tab p.o. daily. 7.  Humalog 4 units before meals and at bedtime.  8.  Lantus  10 units at bedtime.  9.  Hyoscyamine 0.125 mg 4 times per day as needed for cramping in the abdomen. 10.  Multivitamins half tab p.o. b.i.d.  11.  Vitamin D3 2000 international units daily in the morning, 1000 units in the evening.  12.  Calcium plus vitamin D 2 tabs daily. 13.  Omeprazole 20 mg 1 tab p.o. twice a day.  14.  Mag oxide 250 mg 1 tab p.o. daily. 15.  Amitriptyline 25 mg 1/2 tab p.o. at bedtime. 16.   Tylenol 650 mg q. 4 hours p.r.n.  17.  Morphine extended release 15 mg 1 tab p.o. q. 12 hours. 18.  Percocet 5/325 mg q. 4 hours p.r.n. 19.  Magnesium hydroxide 30 mL b.i.d. as needed for constipation. 20.  Clonidine 0.1 mg 1 tab p.o. b.i.d.  21.  Coumadin 3 mg daily. 22.  Docusate 1 tab p.o. b.i.d. as needed.  23.  Augmentin 875 mg 1 tab p.o. every 12 hours x 5 days.   HOME OXYGEN:  Yes, 2 liters.   DIET: Low fat, low cholesterol, carbohydrate.   ACTIVITY: As tolerated with a back brace. Also use prosthesis when walking.   TIMEFRAME FOR FOLLOWUP:  In 1 to 2 weeks with Dr. Elwyn Reach as well as Dr. Rudene Christians in 2 to 4 weeks. The patient is referred to rehabilitation. ____________________________ Lafonda Mosses Posey Pronto, MD shp:sb D: 11/21/2012 12:02:50 ET T: 11/21/2012 12:16:14 ET JOB#: [id number]  cc: Haidynn Almendarez H. Posey Pronto, MD, <Dictator> Alric Seton MD ELECTRONICALLY SIGNED 11/25/2012 19:33

## 2014-12-12 NOTE — Consult Note (Signed)
Impression:     74yo female w/ h/o renal transplant without rejection in 2001, recent vertebral fracture who has Pseudomonas UTI.     She has been treated for an Enterococcal UTI after being discharged from the hospital.  She subsequently has had intermittent confusion.  A recent culture has grown Pseudomonas.  She was being treated with Ertapenem which is not active against Pseudomonas.  She has not had a foley in place since discharge.  Her current u/a has significant pyuria, but a repeat UCx is negative.    Unclear whether she has a UTI.  She has pyuria, but if she had a Pseudomonas UTI that was not treated, it should still be in the urine.      Given her prior renal transplant, will plan on treating for 10 days.    Would continue meropenem.  Pharmacy is dosing.  Electronic Signatures: Farzad Tibbetts MPH, Heinz Knuckles (MD)  (Signed on 19-May-14 12:44)  Authored  Last Updated: 19-May-14 12:44 by Deaken Jurgens MPH, Heinz Knuckles (MD)

## 2014-12-12 NOTE — Discharge Summary (Signed)
PATIENT NAME:  Elizabeth [name], Elizabeth [name] MR#:  [id number] DATE OF BIRTH:  May 02, 1941  DATE OF ADMISSION:  01/04/2013 DATE OF DISCHARGE:  01/10/2013   DISCHARGE DIAGNOSES:  1. Recurrent urinary tract infection, now improving on antibiotic (meropenem) per previous urine culture and sensitivity, which grew Pseudomonas from 8th of May. Repeat urine culture was contaminated. Blood culture remained negative.  2. Metabolic encephalopathy, likely due to severe hypothyroidism with TSH more than 100, improving on Synthroid replacement, unsure etiology.   SECONDARY DIAGNOSES:  3. History of kidney transplant.  4. Recurrent urinary tract infection.  5. History of chronic atrial fibrillation.  6. Coronary artery disease. 7. Chronic kidney disease.  8. Peripheral neuropathy.  9. Hypothyroidism.  10. 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   CONSULTATIONS:  1. Endocrine, Dr. Gabriel Carina.  2. Infectious disease, Dr. Clayborn Bigness.  3. Palliative care, Dr. Ermalinda Memos.   PROCEDURES AND RADIOLOGY:  1. Chest x-ray on 16th of May showed atelectasis or pneumonia at retrocardiac region. No focal pneumonia.  2. Abdominal 3-way on 16th of May showed constipation. No evidence of ileus or obstruction.  3. CT scan of the head without contrast on 16th of May showed no evidence of acute ischemic or hemorrhagic infarction. Areas of encephalomalacia in the right occipital lobe and in the left cerebellar hemisphere. No intracranial mass effect. Moderate ventriculomegaly, slightly more conspicuous than May 2012.  4. CT scan of the abdomen and pelvis on 18th of May showed no definite evidence of hydronephrosis of the transplanted kidney. Extrarenal pelvis. Cannot absolutely exclude a nonobstructing stone in the ureter of the transplanted kidney. Atrophic native kidneys. No evidence of obstruction or inflammation. Changes seen secondary to previous cholecystectomy. Fullness of the pancreatic head, but a discrete  mass or inflammatory changes not demonstrated. Moderate amount of fluid and stool and gas within the small and large bowel. No evidence of bowel inflammation. Small bilateral pleural effusion. Bibasilar atelectasis versus early pneumonia. Compression fracture of L4 which was demonstrated on a CT scan of the lumbar spine dated 11/13/2012. Mild retropulsion of the bone with some mass effect upon the right aspect of the thecal sac. AP dimension of the thecal sac remains 9 mm.   HISTORY AND SHORT HOSPITAL COURSE: The patient is a 74 year old female with the above-mentioned medical problems who was admitted for UTI. The patient was recently found to have Pseudomonas UTI on her urine culture and was felt to be treated with inappropriate antibiotics as the culture was showing resistance to those antibiotics. She was switched over to meropenem. Please see Dr. Governor Specking dictated history and physical for further details. The patient was improving with treatment of UTI with IV meropenem. During the course, she was also found to have severe hypothyroidism causing metabolic encephalopathy for which endocrine consultation was obtained with Dr. Gabriel Carina, who recommended to continue oral replacement of Synthroid. The patient was improving with thyroid hormone replacement. Infectious disease consultation was obtained with Dr. Clayborn Bigness, who recommended a total 10-day course of IV meropenem as the patient continued improvement. She was feeling much better and was close to her baseline on the 22nd of May and is being discharged to [facility] in stable condition.   PERTINENT PHYSICAL EXAMINATION ON THE DATE OF DISCHARGE: VITAL SIGNS: As follows: Temperature 98.1, heart rate 61 per minute, respirations 20 per minute, blood pressure 135/77 mmHg. She is saturating 94% on room air.  CARDIOVASCULAR: S1 and S2 normal. No murmurs, rubs or gallops.  LUNGS: Clear to auscultation bilaterally.  No wheezing, rales, rhonchi or crepitation.   ABDOMEN: Soft, benign.  NEUROLOGIC: Nonfocal examination.  All other physical examination remained at baseline.   DISCHARGE MEDICATIONS:  1. Lipitor 10 mg p.o. every other day at bedtime.  2. Humalog sliding scale insulin as she was taking before.  3. Hyoscyamine 0.125 mg p.o. 4 times a day as needed.  4. Calcium with vitamin D 2 tablets p.o. daily.  5. Acetaminophen 650 mg p.o. every 4 hours as needed.  6. Magnesium hydroxide 30 mL p.o. b.i.d. as needed.  7. Colace 100 mg p.o. b.i.d. as needed.  8. Alprazolam 0.25 mg p.o. every 6 hours as needed. 9. Warfarin 3 mg p.o. daily.  10. Fludrocortisone 0.1 mg p.o. every other day. 11. Insulin Lantus 8 units subcutaneous daily.  12. Lidoderm 5% topical film to affected area once a day, to be removed at 8:00 p.m. and to be placed at 8:00 a.m.   13. Magnesium oxide 400 mg p.o. b.i.d.  14. Multivitamin once daily.  15. Myfortic 360 mg p.o. b.i.d.  16. Acetaminophen/hydrocodone 325/5 mg 2 tablets p.o. q.h.s.  17. Nystatin topical to affected area twice a day.  18. Omeprazole 20 mg p.o. daily. 19. Prednisone 5 mg p.o. daily.  20. Mirtazapine 7.5 mg p.o. q.h.s.  21. Vitamin C 500 mg p.o. b.i.d.  22. Vitamin D3 2000 international units once every morning and 1 tablet (1000 international units) once every evening. 23. Zinc sulfate 220 mg p.o. b.i.d.  24. Prograf 0.5 mg 2 capsules p.o. q.h.s.  25. Acetaminophen/hydrocodone 1 tablet p.o. every 3 hours as needed.  26. Prograf 0.5 mg 4 capsules p.o. every morning.  27. Meropenem 1 gram IV b.i.d. for 5 days.  28. Synthroid 200 mcg p.o. daily, brand name only as per request.   DISCHARGE DIET: Low sodium, low fat, low cholesterol, 1800 ADA.   DISCHARGE ACTIVITY: As tolerated.   DISCHARGE INSTRUCTIONS AND FOLLOWUP: The patient was instructed to follow up with her primary care physician, Dr. Juluis Pitch, in 1 to 2 weeks. She will need followup with Dr. Lucilla Lame from endocrine in 2 to 4 weeks.  She was requesting a TSH and basic metabolic panel check with the results forwarded to her primary care physician and Dr. Gabriel Carina for Synthroid dose adjustment. She will need PT/INR check on Monday, the 26th of May and Wednesday 28th of May with the results forwarded to primary care physician for Coumadin dose adjustment.   TOTAL TIME DISCHARGING THIS PATIENT: 50 minutes.   ____________________________ Lucina Mellow. Manuella Ghazi, MD vss:OSi D: 01/10/2013 13:41:58 ET T: 01/10/2013 14:38:02 ET JOB#: [id number]  cc: Simrin Vegh S. Manuella Ghazi, MD, <Dictator> Youlanda Roys. Lovie Macadamia, MD A. Lavone Orn, MD [place] Blocker, MD Remer Macho MD ELECTRONICALLY SIGNED 01/11/2013 15:10

## 2014-12-12 NOTE — Consult Note (Signed)
General Aspect Elizabeth [name]  is a 74 year old woman with a history of CVA in early 2012 on warfarin, end-stage renal disease secondary to diabetic nephropathy, s/p  kidney transplant in December 2001 with normal creatinine,  retinopathy, neuropathy, and nephropathy, CAD, status post 1-vessel bypass of her circumflex in 2001, chronic orthostatic hypotension, peripheral vascular disease status post left BKA in the setting of a Charcot joint,  admitted to [facility] in October 2011 with severe PNA,  peak Trop 0.19, atrial fibrillation during her hospital course, started on amiodarone, With discontinuation of her amiodarone secondary to bradycardia and orthostatic hypotension. Florinef was started earlier in 2012 with improvement of her symptoms and the ability to work with physical therapy.    She presents after a fall, Now with L4 vertebral fx. Cardiology was consulted for management of her warfarin, pre op eval.   She reports that she was reaching to get something out of a box. She fell back and hit her tailbone.    history of aortic atherosclerosis, hyperlipidemia, labile blood pressures   She has vision problems, continues to have balance problems, and was told that she has vascular dementia. She is taking Florinef 0.1 mg every other day with improvement of her dizziness.  She has had more problems with her abdomen. She has waxing waning constipation and loose bowel movements .Diagnosis of ischemic bowel. She is seeing a doctor at [facility] for second opinion. Also diagnosed with yeast esophagitis, being treated with fluconazole.   Present Illness .  Recent low blood glucose level, fall found at home after trauma to her face in a pool of blood. Sugar was 47. Husband found her after she had fallen. She is now doing better, walking with a walker. Legs are weak. Episode yesterday of severe leg weakness and inability to move. No seizure type activity. Also with chest pain that lasted more than one hour. Symptoms  were on the right-hand side with some radiation to the middle. Husband wonders if it could be musculoskeletal   Echo showed EF 50-55% with mild AI.      Carotid u/s 11/09. normal.  SOCIAL HISTORY: No history of smoking, drinking alcohol or using illicit drugs. Married, lives with her husband. Retired as a Network engineer.    CODE STATUS: The patient is a FULL CODE.   FAMILY HISTORY: Strong family history of diabetes mellitus.   Physical Exam:  GEN well developed, well nourished, no acute distress   HEENT pink conjunctivae   NECK supple   RESP normal resp effort  clear BS   CARD Regular rate and rhythm  Murmur  ectopt   Murmur Systolic   Systolic Murmur Out flow   ABD denies tenderness  soft   LYMPH negative neck   EXTR negative edema   SKIN normal to palpation   NEURO motor/sensory function intact, limited exam   PSYCH alert, A+O to time, place, person, good insight   Review of Systems:  Subjective/Chief Complaint back pain   General: Fatigue  weaklegs   Skin: No Complaints   ENT: No Complaints   Eyes: No Complaints   Neck: No Complaints   Respiratory: No Complaints   Cardiovascular: No Complaints   Gastrointestinal: No Complaints   Genitourinary: No Complaints   Vascular: No Complaints   Musculoskeletal: leg weakness   Neurologic: No Complaints   Hematologic: No Complaints   Endocrine: No Complaints   Psychiatric: No Complaints   Review of Systems: All other systems were reviewed and found to be negative  Medications/Allergies Reviewed Medications/Allergies reviewed     CVA x 3:    recent history facial fractures:    charcot:    diabetic:    L leg amputation:        Admit Diagnosis:   FALL: Onset Date: 15-Nov-2012, Status: Active, Description: FALL  Home Medications: Medication Instructions Status  Prograf 2 milligram(s) orally once a day (in the morning) and 1 mg every evening (9 am, 9 pm) Active  predniSONE 5 mg oral tablet  1 tab(s) orally once a day (in the morning) (9 am) Active  Myfortic 360 mg oral delayed release tablet 1 tab(s) orally 2 times a day (9 am, 9 pm) Active  Synthroid 150 mcg (0.15 mg) oral tablet 1 tab(s) orally once a day (in the morning) on Monday, Tuesday, Wednesday, Thursday, and Friday (9 am) Active  Synthroid 175 mcg (0.175 mg) oral tablet 1 tab(s) orally once a day (in the morning) on Saturday and Sunday (9 am) Active  Lipitor 10 mg oral tablet 1 tab(s) orally every other day (at bedtime) (odd days) (9 pm) Active  Coumadin 5 mg oral tablet 1 tab(s) orally once a day (at bedtime) on Monday, Tuesday, Wednesday, Friday, Saturday, and Sunday (9 pm) Active  Coumadin 2.5 mg oral tablet 1 tab(s) orally once a day (at bedtime) on Thursday (9 pm) Active  Florinef Acetate 0.1 milligram(s) orally every other day (in the morning) (9 am) Active  Humalog 100 units/mL subcutaneous solution  subcutaneous 4 times a day (before meals and at bedtime), As Needed per sliding scale Active  Lantus 100 units/mL subcutaneous solution 10 unit(s) subcutaneous once a day (in the morning) (9 am) Active  hyoscyamine 0.125 mg oral tablet 1 tab(s) orally 4 times a day, As Needed for bowel pain Active  multivitamin 0.5 tab(s) orally 2 times a day (9 am, 9 pm) Active  Vitamin D3 2000 unit(s) orally once a day (in the morning) and 1000 units every evening (9 am, 9 pm) Active  Calcium 600+D 600 mg-200 units oral tablet 2 tab(s) orally once a day at 12 pm Active  omeprazole 20 mg oral delayed release tablet 1 tab(s) orally 2 times a day (9 am, 9 pm) Active  magnesium oxide 250 mg oral tablet 1 tab(s) orally once a day at 12 pm Active  amitriptyline 25 mg oral tablet 0.5 tab(s) (12.5 mg) orally once a day (at bedtime) Active   Lab Results:  Routine Chem:  27-Mar-14 05:07   Glucose, Serum 97  BUN 16  Creatinine (comp) 0.90  Sodium, Serum 139  Potassium, Serum 4.1  Chloride, Serum 106  CO2, Serum 31  Calcium (Total), Serum   8.3  Anion Gap  2  Osmolality (calc) 279  eGFR (African American) >60  eGFR (Non-African American) >60 (eGFR values <60mL/min/1.73 m2 may be an indication of chronic kidney disease (CKD). Calculated eGFR is useful in patients with stable renal function. The eGFR calculation will not be reliable in acutely ill patients when serum creatinine is changing rapidly. It is not useful in  patients on dialysis. The eGFR calculation may not be applicable to patients at the low and high extremes of body sizes, pregnant women, and vegetarians.)   EKG:  Interpretation EKG shows NSR with rate 67 bpm, nonspecific T wave changes   Radiology Results: CT:    25-Mar-14 22:26, CT Lumbar Spine Without Contrast  CT Lumbar Spine Without Contrast   REASON FOR EXAM:    Fall - landed on tail bone -   severe pain -   compression fx at L4 or L5 noted on p  COMMENTS:       PROCEDURE: CT  - CT LUMBAR SPINE WO  - Nov 13 2012 10:26PM     RESULT: Multislice helical acquisition through the lumbar spine is   reconstructed in the axial, coronal and sagittal planes at 3 mm slice   thickness in bone window settings. There is also sagittal soft tissue   reconstruction. There is compression fracture in the L4 vertebral body.   The plain films suggested significant retropulsion of bony density which   is not definitely identified. There is mild loss of height of   approximately 20 to 25%. There is slight distraction of the central   fragments. No other fracture is evident. Facet arthropathy is present.   The posterior elements appear intact.  IMPRESSION:  L4 compression fracture as described. Diffuse degenerative   changes are present. There does not appear to be significant bony   narrowing of the spinal canal at the L4 level. MRI would be more   sensitive for assessment of the canal.    Dictation Site: 6        Verified By: GEOFFREY H. 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 Florinef was started earlier in 2012 with improvement of her symptoms and the ability to work with physical therapy.  1) compression fx acceptable risk for surgery no recent anginal pain no further testing needed.  2) h/o CVA/atrial fib CVA in 2012 Acceptable risk to stop warfarin (reverse if needed) for back surgery Could restart anticoagulation after surgery. She is maintaining NSR --Consider heplocking IVF tomorrow Will decrease rate tonight to 40 ml. Patient has aortic regurg and MR.  High risk of recurrent atrial fib with excess IVF.   3) H/O severe orthostatic hypotension Would continue florinef.  4) CAD,cabg: no recent angina continue outpt meds as much as possible   Electronic Signatures: ,  (MD)  (Signed 27-Mar-14 21:02)  Authored: General Aspect/Present Illness, History and Physical Exam, Review of System, Past Medical History, Health Issues,  Home Medications, Labs, EKG , Radiology, Allergies, Vital Signs/Nurse's Notes, Impression/Plan   Last Updated: 27-Mar-14 21:02 by ,  (MD) 

## 2014-12-12 NOTE — Consult Note (Signed)
PATIENT NAME:  Elizabeth [name], Elizabeth [name] MR#:  [id number] DATE OF BIRTH:  1941-04-02  NEW INFECTIOUS DISEASES CONSULTATION  DATE OF CONSULTATION:  01/07/2013  REFERRING PHYSICIAN: Dr. Manuella Ghazi.   CONSULTING PHYSICIAN: Heinz Knuckles. Elyjah Hazan, M.D.   REASON FOR CONSULTATION: Pseudomonas UTI.   HISTORY OF PRESENT ILLNESS: The patient is a 74 year old female with a past history significant for a renal transplant without any prior rejection in 2001, diabetes, peripheral vascular disease status post left below-knee amputation, and a recent vertebral fracture who was admitted on 05/16 with waxing and waning mental status and anemia. Apparently at the end of her hospital stay she had an enterococcal UTI and was discharged on April 2nd on Augmentin. Her husband states that since she was discharged she has had her urine checked several times and continues to have signs of a UTI. She was given Augmentin, Cipro and amoxicillin, but continued to have intermittent confusion. She also has been feeling having chills and subjective fevers at times. She had a culture drawn last week which showed Pseudomonas. She was started on ertapenem IM daily. Despite this, she continued to have confusion and subjective fevers and chills. She had blood work done which showed a low hemoglobin, and she was transferred to the hospital. Since being here her hemoglobin has actually looked better. Her urinalysis does show evidence for pyuria, however urine culture from admission showed shows mixed bacterial flora. Blood cultures are negative. Her pseudomonas isolate is resistant to fluoroquinolones.   ALLERGIES: ERYTHROMYCIN and PERCOCET, which caused stomach upset.   PAST MEDICAL HISTORY: 1.  Recent vertebral fracture, whose course was complicated by an enterococcal UTI.  2.  Renal transplant in 2001, on stable immunosuppression. No history of rejection.  3.  Diabetes.  4.  Peripheral vascular disease, status post left BKA.  5.  Dementia.  6.   Chronic atrial fibrillation.  7.  Coronary artery disease, status post bypass.  8.  Peripheral neuropathy.  9.  Hypothyroidism.   SOCIAL HISTORY: The patient is in a skilled nursing facility. Previously she was living with her husband. She does not smoke. She does not drink.   FAMILY HISTORY: Positive for diabetes.   REVIEW OF SYSTEMS: Difficult to obtain from the patient because of her confusion. She states that she felt cold and felt like she had a fever. She had some pain in her legs but was otherwise somewhat confused and a difficult historian.   PHYSICAL EXAMINATION: VITAL SIGNS: T-max of 99.0, T-current of 96.8, pulse 57, blood pressure 140/72; 100% on room air.  GENERAL: A 74 year old female in no acute distress.  HEENT: Normocephalic, atraumatic. Pupils equal and reactive to light. Extraocular motions intact. Sclerae, conjunctivae, and lids are without evidence for emboli or petechiae. Oropharynx shows no erythema or exudate. Teeth and gums are in fair condition.  NECK: Supple. Full range of motion. Midline trachea. No lymphadenopathy. No thyromegaly.  CHEST: Clear to auscultation bilaterally. Good air movement. No focal consolidation.  CARDIAC EXAM: Regular rate and rhythm without murmur, rub, or gallop.  ABDOMINAL EXAM: Soft, nontender, and nondistended. No hepatosplenomegaly. No hernias noted.  EXTREMITIES: She is status post BKA. The stump appears to be well-healed, with no ulcerations or erythema or swelling. The right lower extremity was somewhat tender to touch, but had no evidence for any ulcerations.  SKIN EXAM: No rashes. No stigmata of endocarditis. Specifically, no Janeway lesions or Osler nodes.  NEUROLOGIC: The patient was awake and somewhat interactive, but had trouble answering questions and deferred to  her husband for all questions regarding her recent history.  PSYCHIATRIC: She appeared pleasant, but somewhat confused.   LABORATORY DATA: Shows a BUN of 18, creatinine  0.86, bicarbonate 32, anion gap of 4.   LFTs were unremarkable.   White count from 05/17 was 4.1 with a hemoglobin of 9.9, platelet count of 200, ANC of 2.3.   Blood cultures from admission show no growth.    A urinalysis has 1+ blood, 100 mg/dL protein, negative nitrites, 3+ leukocyte esterase, 145 red cells and 7546 white cells.   Urine culture shows mixed bacterial flora.   A chest x-ray shows a density in the retrocardiac region consistent with atelectasis versus pneumonia.   A CT scan of the head without contrast showed no acute hemorrhage or ischemia.   A 3-way of the abdomen showed possible constipation, but no evidence of ileus or obstruction.   A CT scan of the abdomen and pelvis for possible stone showed no evidence of hydronephrosis, and a transplanted kidney. There was a small amount of the intraductal dilatation and common bile duct dilatation that did not appear to be abnormal. There was a moderate amount of stool and fluid and gas in the small and large intestine. There is no inflammation noted, however.   IMPRESSION: A 74 year old female with a history of renal transplant without rejection in 2001, recent vertebral fracture, with pseudomonas urinary tract infection.   RECOMMENDATIONS: 1.  She had been treated for an enterococcal UTI after being discharged from the hospital. She subsequently has had intermittent confusion. A recent culture has grown pseudomonas. She is being treated with ertapenem, which was not active against pseudomonas. She has not had a Foley in place since her prior discharge. Her current urinalysis has significant pyuria, and repeat urine culture is negative.  2. It is unclear whether she has a UTI. She has pyuria, but if she had a pseudomonas UTI that was not treated it should still be in the urine.  3.  Given her prior renal transplant, will plan on treating for 10 days.  4.  Would continue meropenem. Pharmacy is dosing.  5.  Would ask care  management whether she will need a PICC line or whether a peripheral IV will be acceptable at the facility.   This is a moderately complex infectious disease case. Thank you very much for involving me in Ms Shelley's care.   ____________________________ Heinz Knuckles. Kyung Muto, MD meb:dm D: 01/07/2013 12:53:02 ET T: 01/07/2013 13:13:02 ET JOB#: [id number]  cc: Heinz Knuckles. Isaly Fasching, MD, <Dictator> Jabarie Pop E Breland Trouten MD ELECTRONICALLY SIGNED 01/08/2013 10:39

## 2014-12-12 NOTE — Consult Note (Signed)
PATIENT NAME:  Elizabeth [name], Elizabeth [name] MR#:  [id number] DATE OF BIRTH:  1941/05/12  DATE OF CONSULTATION:  01/07/2013  REFERRING PHYSICIAN:  Fritzi Mandes, MD   CONSULTING PHYSICIAN:  A. Lavone Orn, MD PRIMARY CARE PHYSICIAN: Juluis Pitch, MD   CHIEF COMPLAINT: Uncontrolled hypothyroidism.   HISTORY OF PRESENT ILLNESS: This is a 74 year old female seen in consultation for uncontrolled hypothyroidism. The patient was admitted for altered mental status and was found to have a pseudomonas UTI as well as a TSH level of greater than 100. She underwent partial thyroid surgery in childhood and then completion thyroidectomy in the 1990s. She has long-term been taking thyroid hormone replacement. Her husband indicates that she had a TSH level around eight last month, and at that point her dose of levothyroxine was increased somewhat. She had been taking an alternating dose of 150 and 175 mcg per day until the dose was increased to 200 mcg per day. I requested and received the medication reconciliation report from [facility], her nursing facility. I confirmed that at least since 12/20/2012, she had been given Synthroid 200 mcg daily at 6:30 a.m. She does take vitamins, however, those are given later in the day around 9:00 a.m. Since hospitalization, she had been maintained on 200 mcg per day. She does report feeling very cold, this has been ongoing for about 2 weeks. She has had constipation. She denies any hair loss or skin changes. She does report fatigue and a general sense of malaise.   PAST MEDICAL HISTORY: 1.  Post surgical hypothyroidism.  2.  Diabetes mellitus.  3.  Diabetic peripheral neuropathy.  4.  Peripheral vascular disease, status post left below-knee amputation.  5.  Hypertension.  6.  Atrial fibrillation.  7.  Coronary artery disease.  8.  Chronic kidney disease.  9.  History of renal transplant.   PAST SURGICAL HISTORY: 1.  Renal transplant.  2.  Coronary artery bypass graft.  3.   Left below-knee amputation.  4.  Hysterectomy.   SOCIAL HISTORY: The patient resides at [facility], a nursing facility. No tobacco or alcohol use.   FAMILY HISTORY: Positive for diabetes.   CURRENT MEDICATIONS: 1.  Levothyroxine 200 mcg p.o. daily.  2.  Vitamin C 500 mg b.i.d.  3.  Vitamin D 2000 international units daily.  4.  Docusate 100 mg b.i.d.  5.  Florinef 0.1 mg daily.  6.  Magnesium oxide 400 mg b.i.d.  7.  Meropenem 1 gram q. 12 hours.  8.  Remeron 7.5 mg at bedtime.  9.  Mycophenolic acid 381 mg b.i.d.  10.  Prograf  0.5 mg 2 capsules at bedtime and 4 capsules in a.m.  11.  Nystatin powder to affected area b.i.d.  12.  Prilosec 20 mg daily.  13.  Zinc 220 mg b.i.d.  14.  Coumadin 3 mg daily.  15.  Lantus 8 units daily.  16.  Novolin R sliding scale.   ALLERGIES: ERYTHROMYCIN.   REVIEW OF SYSTEMS: HEENT: Denies blurry vision. Denies sore throat.  NECK: Denies neck pain. Denies dysphagia.  CARDIAC: Denies chest pain. Denies palpitations.  PULMONARY: Denies cough. Denies shortness of breath.  ABDOMEN: Reports some abdominal discomfort, diffuse and nonspecific. Poor appetite.  EXTREMITIES: Reports diffuse extremity pain. Status post left below-knee amputation. Denies extremity swelling.  SKIN: Denies rash or pruritus. ENDOCRINE: Denies heat intolerance. Does report general cold intolerance.   GENERAL: Denies weight loss, denies fevers.   PHYSICAL EXAMINATION: VITAL SIGNS: Height 62.9 inches, weight 126 pounds,  BMI 22.4. Temperature 96.8, pulse 57, respirations 20, blood pressure 140/72, pulse ox 100% on room air.  GENERAL: White female, appears chronically ill, no acute distress.  PSYCHIATRIC: Slow to answer questions, alert and oriented.  HEENT: EOMI. Oropharynx is clear.   NECK: Supple. Thyroid surgically absent.  LYMPH: No submandibular or anterior cervical lymphadenopathy.  CARDIAC: Regular rate and rhythm. No appreciable murmur.  PULMONARY: Clear  bilaterally. No wheeze.  ABDOMEN: Diffusely tender, nondistended, soft, positive bowel sounds.  EXTREMITIES: Status post left below-knee amputation. Right extremity without edema.  Dressing in place over upper shin at the site of skin laceration. Dressing is clean and dry.   LABORATORY DATA: Glucose 227, BUN 18, creatinine 0.86, sodium 137, potassium 3.9, chloride 101, calcium 9.2 TSH greater than 100, hematocrit 28.7, hemoglobin 9.9, platelets 200, INR 2.4. Blood culture negative x 2.   ASSESSMENT: A 74 year old female with multiple medical problems to include postsurgical hypothyroidism which is uncontrolled.   PLAN: Again based on her MAR and good administration of levothyroxine, it  does not seem that uncontrolled hypothyroidism can be attributed to noncompliance. She could have a component of malabsorption. I recommend checking her free T4 level tomorrow, and if this is still low, giving her IV levothyroxine which should be dosed at half the oral dose or 100 mcg/day. If free T4 is normal, then continue to give the levothyroxine 200 mcg orally. Ensure that levothyroxine dose is separated by 3 hours from any vitamins and at least 30 minutes from food. TSH will typically lag behind normalization of the free T4, so there is not much utility in checking it within the next 4 weeks. In the meantime, could certainly follow free T4 to ensure it is therapeutic.    Thank you for the kind request for consultation.    ____________________________ A. Lavone Orn, MD ams:cb D: 01/07/2013 16:00:41 ET T: 01/07/2013 16:28:58 ET JOB#: [id number]  cc: A. Lavone Orn, MD, <Dictator> Sherlon Handing MD ELECTRONICALLY SIGNED 01/08/2013 17:20

## 2014-12-12 NOTE — H&P (Signed)
PATIENT NAME:  Elizabeth [name], Elizabeth [name] MR#:  [id number] DATE OF BIRTH:  09-05-1940  DATE OF ADMISSION:  01/04/2013  PRIMARY CARE PHYSICIAN: Donato Heinz, MD  EMERGENCY ROOM PHYSICIAN: Shirley Friar. Braud, MD  CHIEF COMPLAINT: Abnormal labs.   HISTORY OF PRESENT ILLNESS: The patient is a 74 year old female brought in from [facility] because of abnormal labs. The patient was here in April. She was admitted on March 26 and discharged on  April 2 for fracture with L4 compression fracture. Since then, she was at [facility]. Today, EMS brought the patient because the patient's labs were abnormal with hemoglobin and hematocrit of 8.8 and 26.9, and the patient also was more confused than usual. The patient's history is obtained from ER charts and also patient's husband. According to the husband, she was treated with antibiotics for a UTI for the past 7 weeks, and recently they changed the antibiotic. Since the last 1 week, the changed the  antibiotic to [facility] and she is getting IM shots of Invanz. The ER physician contacted the nursing home staff and called the urine culture report, which is showing Pseudomonas, more than 100,000 colonies sensitive to amikacin, cefepime and Fortaz, resistant to Cipro and also  resistant to levofloxacin, sensitive to meropenem, piperacillin and tobramycin. The patient cannot give any history. The patient is not appearing in distress. The patient is not doing much physical therapy according to the husband.  PAST MEDICAL HISTORY: Significant for history of kidney transplant on Prograf and CellCept, history of diabetes mellitus, history of peripheral vascular disease with left below-knee amputation, history of tibia and fibula fracture on the right side, history for dementia, history of enterococcal UTI before, history of chronic A. fib on anticoagulation, also history of coronary artery disease with bypass surgery, chronic kidney disease, peripheral neuropathy. Past medical  history also includes hypothyroidism.  PAST SURGICAL HISTORY: Significant for hysterectomy, bypass surgery and history of  below-knee amputation. Surgical history also includes kidney transplant.   MEDICATIONS:  1.  Tylenol 325 mg 2 tablets every 4 hours as needed.  2.  Alprazolam 0.25 mg p.o. every 6 hours as needed for anxiety.  3.  Amitriptyline 25 mg 1/2 tablet once a day.  4.  Clonidine 0.1 mg p.o. b.i.d.  5.  Colace 100 mg p.o. b.i.d. 6.  Fludrocortisone 0.1 mg once a day. 7.  Humalog 100 units 4 times a day.  8.  Lantus 10 units in the morning. 9.  Lipitor 10 mg at bedtime.  10.  Magnesium oxide 250 mg q.p.m.  11.  Morphine 15 mg q.12 hours.  12.  Omeprazole  20 mg p.o. b.i.d.  13.  Mycophenolate 360 mg every 12 hours. 14.  Prednisone 5 mg p.o. daily. 15.  Prograf 2 mg in the morning and 1 mg in the evening.  16.  Synthroid 150 mcg p.o. daily Monday, Wednesday, Thursday and Friday.  17.  Synthroid 175 mcg in the morning on Saturday and Sunday.  18.  Coumadin 3 mg once a day. 19.  Vitamin D3, 2000 units in the morning and 1000 units in the evening.   ALLERGIES: ERYTHROMYCIN.   SOCIAL HISTORY: No smoking, no drinking. Used to live with husband. Right now she is at [facility].   CODE STATUS: Full code.   FAMILY HISTORY: Significant for diabetes.   REVIEW OF SYSTEMS: Unable to obtain because right now she is very sleepy.  PHYSICAL EXAMINATION:  GENERAL: Frail-looking female, sleeping at this time and unable to answer questions  completely. VITAL SIGNS: The patient's temperature is 98.1, heart rate 66, blood pressure 120/64, sats 100% on room air.  HEENT: Head atraumatic, normocephalic. Pupils equally reacting to light. No scleral icterus. No conjunctival pallor. Nose: No turbinate hypertrophy. No drainage. Ears: Tympanic membrane is not congested. Mouth: No lesions. No exudates.  NECK: Supple. No thyroid enlargement. No JVD. No carotid bruit.  RESPIRATORY: Clear to  auscultation. No wheeze.  CARDIOVASCULAR: S1, S2 regular. No gallops. PMI not displaced. Pulses  equal at carotids. EXTREMITIES: The patient has malunion and also swelling around the right knee due to old healed fracture.  GASTROINTESTINAL: Complained of tenderness when I palpated the abdomen. Bowel sounds present. No distention, but generalized tenderness present. No rebound tenderness.  MUSCULOSKELETAL: The patient's gait was not tested, but no effusion in the joints.  SKIN: Warm and dry.  LYMPH NODES: No lymphadenopathy.  NEUROLOGIC: The patient has no flaccidity. No obvious neurological deficits.  PSYCHIATRIC: The patient is demented at baseline.   LABORATORY AND DIAGNOSTIC DATA: WBC 6, hemoglobin 9.4, hematocrit 28.1, platelets 228. Electrolytes: Sodium 135, potassium 4, chloride 100, bicarbonate 28, BUN 19, creatinine 0.89, glucose 127. INR 2.7. UA: WBC 7546, leukocytes 3+; the patient's urine is turbid and bacteria 3+. EKG showed sinus bradycardia at 59 beats per minute.   ASSESSMENT AND PLAN: The patient is a 74 year old female patient with urinary tract infection and change in mental status more than usual. The patient is already treated with Invanz for a week. Cultures showing Pseudomonas. The patient because of her immunosuppression, we need to  keep her in the hospital on observation status and will continue Fortaz, follow the blood cultures for  evaluation of sepsis due to immunosuppression state. Check the CBC in the morning.   The patient has other diagnoses of hypertension, diabetes, hypothyroidism, chronic atrial fibrillation. Continue other medications.   Decreased oral intake: Continue intravenous fluids.   Mild sinus bradycardia: Hold the beta blockers and also clonidine.   Discussed the plan with the patient's husband.  CODE STATUS: Full code, but they do not want any artificial support in case the patient goes into respiratory arrest. At that time, the family is ready to  discuss the code status again.   TIME SPENT ON HISTORY AND PHYSICAL: About 50 minutes.   ____________________________ Epifanio Lesches, MD sk:jm D: 01/04/2013 21:48:19 ET T: 01/04/2013 22:18:58 ET JOB#: [id number]  cc: Epifanio Lesches, MD, <Dictator> Epifanio Lesches MD ELECTRONICALLY SIGNED 01/20/2013 20:09

## 2014-12-12 NOTE — H&P (Signed)
PATIENT NAME:  Elizabeth [name], Elizabeth [name] MR#:  [id number] DATE OF BIRTH:  28-Nov-1940  DATE OF ADMISSION:  11/14/2012  PRIMARY CARE PHYSICIAN: Dr. Donato Heinz.    CHIEF COMPLAINT: Fall and confusion.   HISTORY OF PRESENT ILLNESS: The patient is a 74 year old white female with a past medical history of multiple medical problems including long history of diabetes mellitus insulin dependent, multiple complications related to diabetes mellitus including neuropathy, Charcot's joint, retinopathy, nephropathy requiring renal transplant in 2001. Has been on immunosuppressants and has been stable. She is brought to the Emergency Department after having a fall at home. The patient is status post left BKA, as well as right multiple fibular and tibial fractures, as well as Charcot's joint. Has been experiencing balancing issues for a long time. Today, the patient was trying to rearrange her house and was lifting some furniture, fell backwards and started to experience severe pain in the coccyx area. Considering this, the patient is brought to the Emergency Department. On workup in the Emergency Department, the patient is found to have L4 fracture. The patient became agitated and confused while in the Emergency Department. The patient required 1 dose of Ativan, and the patient is currently sleeping. The history is mainly obtained from the patient's husband who is at bedside. 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 Usually has well-controlled blood sugars and blood pressure. Denies having any loss of consciousness at the time.   PAST MEDICAL HISTORY:  1. Diabetes mellitus, insulin dependent.  2. Hypertension.  3. End-stage renal disease, status post kidney transplant in 2001, on immunosuppressants.  4. Status post left BKA for Charcot's joint.  5. History of Charcot's joint with multiple falls, with tibial and fibular fractures. Currently in a  support boot.  6. Irritable bowel syndrome.  7. Hypothyroidism.  8. Coronary artery disease, status post 1 vessel coronary artery bypass grafting.  9. Chronic kidney disease.  10. Peripheral neuropathy.   PAST SURGICAL HISTORY:  1. Left BKA.  2. Right leg multiple tibial and fibular fractures status post surgery.  3. Hysterectomy.  4. Total thyroidectomy for Graves' disease.  5. Coronary artery bypass grafting.  6. Kidney transplant.   ALLERGIES: ERYTHROMYCIN.   HOME MEDICATIONS:  1. Vitamin D3 2000 units once a day.  2. Synthroid 150 mcg daily.  3. Prograf 2 g once a day.  4. Prednisone 5 mg once a day.  5. Omeprazole 20 mg 2 times a day.  6. Myfortic 360 mg 2 times a day.  7. Multivitamin 1/2 tablet 2 times a day.  8. Magnesium oxide 250 mg once a day.  9. Lipitor 10 mg every other day.  10. Lantus 10 units once a day.  11. Hyoscyamine 0.125 mg 4 times a day.  12. Humalog subcutaneous 4 times a day.  13. Florinef 0.1 mg orally every other day.  14. Coumadin 5 mg daily.  15. Omeprazole 20 mg b.i.d.  16. Calcium with vitamin D2 two tablets once a day.  17. Amitriptyline 12.5 mg daily.   SOCIAL HISTORY: No history of smoking, drinking alcohol or using illicit drugs. Married, lives with her husband. Retired as a Network engineer.    CODE STATUS: The patient is a FULL CODE.   FAMILY HISTORY: Strong family history of diabetes mellitus.   REVIEW OF SYSTEMS: The patient is currently quite somnolent, unable to obtain any review of systems from the patient.   PHYSICAL EXAMINATION:  GENERAL: This is a frail-looking white female, lying down on the bed, quite somnolent from Ativan.  VITAL SIGNS: Temperature 97.6, pulse 62, blood pressure 148/65, respiratory rate of 20, oxygen saturation 100% on 2 liters of oxygen.  HEENT: Head normocephalic, atraumatic. Eyes: No scleral icterus. Conjunctivae normal. Pupils equal and react to light. Mucous membranes have mild dryness.  NECK: Supple. No  lymphadenopathy. No JVD. No carotid bruit.  CHEST: Has no focal tenderness.  LUNGS: Bilaterally clear to auscultation.  HEART: S1, S2, regular. A systolic murmur is heard.  ABDOMEN: Bowel sounds present. Mild tenderness in the suprapubic area.  EXTREMITIES: Right leg in the boot.Marland Kitchen   NEUROLOGIC: The patient is currently quite somnolent.   LABS: CMP is completely within normal limits CBC: WBC of 12.6, hemoglobin 12, platelet count of 176. X-RAY OF THE LUMBAR SPINE: Osteopenia, fracture of the L4 vertebral body. CT OF THE LUMBAR SPINE: L4 compression fracture. Diffuse degenerative changes. There does not appear to be significant bone narrowing of the spinal canal at the L4 level.   ASSESSMENT AND PLAN: The patient is a 74 year old female who comes to the Emergency Department after having a fall. Has been agitated in the Emergency Department with confusion.  1. Fall: Seems to be more mechanical. Will involve physical therapy, occupational therapy. Per the patient's husband, the patient just completed a course of physical therapy.  2. L4 compression fracture: Will consult orthopedic surgery in the morning . Will also continue to provide pain management. If the patient develops severe pain in the back, may consider getting a lumbar brace; however, this is up to the orthopedic surgeon's discretion.   3. Status post kidney transplant: Will continue the Prograf and Myfortic. Will also check the levels.  4. Diabetes mellitus, insulin dependent: Will continue with the home dose.  5. Suprapubic tenderness: Will get a urinalysis.  6. 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  7. The patient is on therapeutic dose of Coumadin for atrial fibrillation.   TIME SPENT: 50 minutes.    ____________________________ Monica Becton, MD pv:gb D: 11/14/2012 01:38:19 ET T: 11/14/2012 03:03:03 ET JOB#: [id number]  cc: Monica Becton,  MD, <Dictator> Donato Heinz, MD Monica Becton MD ELECTRONICALLY SIGNED 11/27/2012 2:03

## 2014-12-12 NOTE — Consult Note (Signed)
Allergies:  Erythromycin: GI Distress  Assessment/Plan:  Assessment/Plan 74 yo F seen in consultation for uncontrolled post-surgical hypothyroidism. Her TSH was >100 on 5/16 and repeat level today again >100. She has been receiving levothyroxine 200 mcg daily for the last month, this has been administered at her nursing home. Per her spouse, her TSH was mildly elevated at ~8 about one month ago and at that time, her dose of levothyroxine was increased from 150 alternating with 175 daily. I reviewed the MAR from [facility] and confired med had been signed off. She also has UTI. Complains of feeling cold, constipation and fatigue. No hair loss or skin changes. Pt was examined and interviewed.  A/ Uncontrolled post-surgical hypothyroidism  P/ Would expect that with TSH so high, that she had not been recieving her medication, however the [facility] has been completed and signed. She may have a component of malabsorption. I will get a free T4 level tomorrow and if it low, then we can change to IV levothyroxine.  Full consult to be dictated.   Case Discussed With patient, family   Electronic Signatures: Judi Cong (MD)  (Signed 19-May-14 15:51)  Authored: ALLERGIES, Assessment/Plan   Last Updated: 19-May-14 15:51 by Judi Cong (MD)

## 2014-12-12 NOTE — Consult Note (Signed)
Brief Consult Note: Diagnosis: L4 compression fracture.   Patient was seen by consultant.   Comments: patient drowsy from pain meds.  discussed kyphoplasty, not sure if she can tolerate it or the need to be off anticogulation.  Electronic Signatures for Addendum Section:  Laurene Footman (MD) (Signed Addendum 26-Mar-14 16:14)  Discussed situation with patient's son.  Kyphoplasty if medically possible and if her pain remains severe would allow mobiliizaiton, but risk of stopping anticoagulation and a surgical procedure may be too high in chich case bracing a pain meds indicated.   Electronic Signatures: Laurene Footman (MD)  (Signed 26-Mar-14 12:39)  Authored: Brief Consult Note   Last Updated: 26-Mar-14 16:14 by Laurene Footman (MD)

## 2014-12-13 NOTE — H&P (Signed)
PATIENT NAME:  Elizabeth [name], Elizabeth [name] MR#:  [id number] DATE OF BIRTH:  [birth year]  PRIMARY CARE PHYSICIAN:  Youlanda Roys. Lovie Macadamia, MD  CARDIOLOGIST: Minna Merritts, MD  CHIEF COMPLAINT: Loss of vision, both eyes.   HISTORY OF PRESENT ILLNESS: This is a 74 year old female with a history of stroke in the past with left hemispheric vision loss on both sides. She presents today with bilateral total loss of vision starting at 10:00 a.m. They got over her to [facility] and saw Dr. Dawna Part  today.  At that time she regained some of her vision and back. She was able to see light and dark. She was referred over to Dr. Precious Reel, for potential temporal arteritis. Dr. Jefm Bryant referred over to the Emergency Room for further evaluation. The patient states that she has had a gradual return of her vision, but it is still fuzzy.  She had a pain on her left temple, which has been going on occasionally. She has had a chronic cough for months, a little bit of shortness of breath, a little cough, some periods of nausea and vomiting.  In the ER, she had a chest x-ray that showed a right lower lobe pneumonia, CT scan of the head that showed moderate to severe global brain atrophy. No acute intracranial process. Hospitalist services were contacted for further evaluation.   PAST MEDICAL HISTORY: CVA with left hemispheric vision loss, both eyes, atrial fibrillation, transient in the past, kidney transplant, diabetes, amputation of the left leg, coronary artery disease, hypothyroidism, hyperlipidemia, orthostatic hypotension.   PAST SURGICAL HISTORY: Left leg amputation, CABG, tonsillectomy, hysterectomy, appendectomy, eye laser surgery, cataracts, kidney transplant, thyroidectomy, precancerous  lesion of the left face.   ALLERGIES: ERYTHROMYCIN WHICH GI-RELATED, A SIDE EFFECT.   SOCIAL HISTORY: No smoking. No alcohol. No drug use. Used to work in a Theatre manager, a hospital and a school.   FAMILY HISTORY:  Father unknown. Mother died of heart issues.   MEDICATIONS: As per prescription writer include: Atorvastatin 10 mg at bedtime, calcium and vitamin D 2 tablets daily, Flomax 0.4 mg daily, fludrocortisone 0.1 mg daily on odd days, Humalog sliding scale, hyoscyamine 1.25 mg 1 tablet 4 times a day as needed for stomach cramping, Lantus 8 units subcutaneous injection daily, Lexapro 10 mg daily, magnesium hydroxide 8% oral solution 30 mL twice a day as needed for constipation, Myfortic 360 mg 1 tablet twice a day, omeprazole 20 mg daily, potassium chloride 20 mEq IV as needed, prednisone 5 mg daily, Prograf 0.5 mg 4 capsules in the a.m., 2 capsules in the p.m., Remeron 15 mg at bedtime, Synthroid 175 mcg daily, warfarin 2.5 mg daily.  REVIEW OF SYSTEMS:  CONSTITUTIONAL: Positive for sweating, positive for weakness, weight gain with the Florinef.  EYES: She does wear glasses and decreased vision today. Ears plugged, runny nose.  RESPIRATORY: Positive for shortness of breath. Positive for chronic cough. No hemoptysis. No sputum.  CARDIOVASCULAR: No chest pain. No palpitations.  GASTROINTESTINAL: Occasional nausea, vomiting. Occasional abdominal pain. Occasional diarrhea, constipation.  GENITOURINARY: No burning on urination or hematuria.  MUSCULOSKELETAL: No joint pain or muscle pain.  INTEGUMENT: No rashes or eruptions.  NEUROLOGIC: Had pass out episodes with orthostatic hypotension in the past.  PSYCHIATRIC: No anxiety or depression.  ENDOCRINE: Positive for hypothyroidism.  HEMATOLOGIC AND LYMPHATIC: No anemia.   PHYSICAL EXAMINATION: VITAL SIGNS: Temperature 98.1, pulse 50, respirations 12, blood pressure 213/50, pulse oximetry 98% on room air.  GENERAL: No respiratory distress.  EYES: Conjunctivae  and lids normal. Pupils equal, round, and reactive to light. Extraocular muscles intact. Vision grossly done by visual fields by me, more like tunnel vision both eyes, worst on the left lateral vision  than the right lateral vision, both eyes.  EARS, NOSE, MOUTH, AND THROAT: Tympanic membranes blocked by wax. Nasal mucosa, no erythema. Throat, no erythema, no exudate seen. Lips and gums, no lesions.  NECK: No JVD. No thyromegaly. No thyroid nodules palpated. Positive bruit on the right side.  LUNGS: Clear to auscultation. No use of accessory muscles to breathe. No rhonchi, rales, or wheeze heard.  CARDIOVASCULAR: S1, S2 normal. No gallops, rubs, or murmurs heard. Carotid upstroke 2+ bilaterally. No bruits. Dorsalis pedis pulses 2+ right lower extremity. Trace edema of right lower extremity. ABDOMEN: Soft, nontender. No organomegaly/splenomegaly. Normoactive bowel sounds. No masses felt.  LYMPHATIC: No lymph nodes in the neck.  MUSCULOSKELETAL: Trace edema right lower extremity. No clubbing. No cyanosis.  SKIN: Left face scar from prior removal of precancerous lesions.  NEUROLOGIC: Cranial nerves II through XII grossly intact. Deep tendon reflexes 1+ bilateral lower extremity. Visual fields, tunnel vision bilaterally. PSYCHIATRIC: The patient is oriented to person, place, and time.   LABORATORY AND RADIOLOGICAL DATA: CT scan of the head shows moderate to severe global brain atrophy. No acute intracranial process. Sedimentation rate is 10. Urinalysis 1+ leukocyte esterase, 100 mg/dL of protein. Glucose 208, BUN 32, creatinine 1.29, sodium 143, potassium 4.9, chloride 104, CO2 of 37, calcium 9.0. Liver function tests: Albumin low at 2.9, total protein low at 5.8. INR 2.7. White blood cell count 4.2, H and H 12.0 and 37.2, platelet count 160,000.  Chest x-ray, pneumonia, right lower lobe.   EKG: Sinus bradycardia at 49 beats per minute, PVC.   ASSESSMENT AND PLAN: 1.  Right lower lobe pneumonia. I will give Rocephin and Zithromax and continue to monitor, obtain a sputum culture.  2.  Blurry vision both eyes. Seems kind of odd to happen in both eyes at once. I do not think this is temporal arteritis  with a sedimentation rate of 10. Seems less likely stroke affecting both eyes, but since the patient already had a stroke in the past affecting the vision of her left peripheral vision both eyes, this could have been a stroke affecting the right peripheral vision of both eyes. Will obtain an MRI of the brain, echocardiogram, and carotid ultrasound to rule out cerebrovascular accident. The patient is also therapeutic on Coumadin at this point.  3.  Kidney transplant. Continue immunosuppressive medications.  4.  Diabetes. Continue Lantus and sliding scale.  5.  History of atrial fibrillation with stroke, therapeutic on Coumadin.  6.  History of coronary artery disease, not on aspirin or beta blocker secondary to bradycardia.  7.  Hypothyroidism. Continue levothyroxine.  8.  Hyperlipidemia, on low dose statin.  9.  Gastroesophageal reflux disease. Will order Protonix while here.  TIME SPENT ON ADMISSION: 60 minutes.   CODE STATUS:  Full code.    ____________________________ Tana Conch. Leslye Peer, MD rjw:LT D: 08/13/2014 20:02:23 ET T: 08/13/2014 20:13:29 ET JOB#: [id number]  cc: Tana Conch. Leslye Peer, MD, <Dictator> Youlanda Roys. Lovie Macadamia, MD Minna Merritts, MD Marisue Brooklyn MD ELECTRONICALLY SIGNED 08/21/2014 11:41

## 2014-12-13 NOTE — Consult Note (Signed)
PATIENT NAME:  Elizabeth [name], Elizabeth [name] MR#:  [id number] DATE OF BIRTH:  01/16/1941  DATE OF CONSULTATION:  08/14/2014  REFERRING PHYSICIAN:   CONSULTING PHYSICIAN:  Leotis Pain, MD  REASON FOR CONSULTATION: Vision loss.  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 The patient states that she had slowly progressive vision loss as well as blurry vision. The patient was also complaining of bilateral temporal pain and on examination and laboratory workup, the patient is status post ESR, which was only 10. Currently, on my examination, this patient is back to her baseline and has no complaints.   PAST MEDICAL HISTORY: Stroke, left homonymous hemianopsia, atrial fibrillation, on Coumadin, orthopedic surgery in 1992, left lower extremity amputation, tonsillectomy, hysterectomy, appendectomy, eyelid surgery.   ALLERGIES: INCLUDE ERYTHROMYCIN.   SOCIAL HISTORY: No smoking. No alcohol on drug use.   FAMILY HISTORY: Noncontributory.   REVIEW OF SYSTEMS: Currently, no shortness of breath. No chest pain. No abdominal pain. No heat or cold intolerance. No weakness on one side of the body compared to the other. Positive for blurry vision. No history of anxiety, depression,   PHYSICAL EXAMINATION:  VITAL SIGNS: Include a temperature of 98.2, pulse 53, respirations 18, blood pressure 176/62.  NEUROLOGIC: A left homonymous hemianopsia that is chronic. Speech appears to be fluent. Extraocular movements are intact. Tongue is midline. Shoulder shrug intact. Motor strength appears to be 4+/5 bilateral upper extremities. Left lower extremity amputation in 1992 and the patient has her right foot in a boot. Not complaining of any blurry vision, at this time. No temporal pain.   IMPRESSION: A 74 year old female with history of left homonymous hemianopsia due to prior stroke, admitted  with a transient episode of blurry vision and a questionable loss of vision.  PLAN: I really do not think this is temporal arteritis with an ESR of 10 as well as temporal arteritis if you get blindness it is persistent. Also temporal arteritis is not consistent with double vision loss.  At this point, the patient's MRI is ordered. I will also obtain MRA of the head. The patient is status post kidney transplant, so I do not think contrast is a good idea, so time-of-flight MRA looking for ophthalmic artery. At this point, no further neurological recommendations in terms of further imaging. This case was discussed with the patient's husband and the patient at bedside today. She should continue to be on anticoagulation.   Thank you. It was a pleasure seeing this patient.    ____________________________ Leotis Pain, MD yz:TT D: 08/14/2014 13:49:03 ET T: 08/14/2014 14:10:15 ET JOB#: [id number]  cc: Leotis Pain, MD, <Dictator> Leotis Pain MD ELECTRONICALLY SIGNED 08/20/2014 13:50

## 2014-12-15 ENCOUNTER — Other Ambulatory Visit: Payer: Self-pay | Admitting: Cardiovascular Disease

## 2014-12-15 NOTE — Telephone Encounter (Signed)
Please review for refill. Thanks!  

## 2014-12-17 ENCOUNTER — Ambulatory Visit (INDEPENDENT_AMBULATORY_CARE_PROVIDER_SITE_OTHER): Payer: Medicare Other | Admitting: *Deleted

## 2014-12-17 ENCOUNTER — Encounter: Payer: Self-pay | Admitting: Cardiovascular Disease

## 2014-12-17 ENCOUNTER — Ambulatory Visit (INDEPENDENT_AMBULATORY_CARE_PROVIDER_SITE_OTHER): Payer: Medicare Other | Admitting: Cardiovascular Disease

## 2014-12-17 VITALS — BP 109/65 | HR 49 | Ht 64.0 in | Wt 135.0 lb

## 2014-12-17 DIAGNOSIS — I495 Sick sinus syndrome: Secondary | ICD-10-CM

## 2014-12-17 DIAGNOSIS — E785 Hyperlipidemia, unspecified: Secondary | ICD-10-CM

## 2014-12-17 DIAGNOSIS — I4891 Unspecified atrial fibrillation: Secondary | ICD-10-CM

## 2014-12-17 DIAGNOSIS — I951 Orthostatic hypotension: Secondary | ICD-10-CM

## 2014-12-17 DIAGNOSIS — I639 Cerebral infarction, unspecified: Secondary | ICD-10-CM

## 2014-12-17 DIAGNOSIS — I2510 Atherosclerotic heart disease of native coronary artery without angina pectoris: Secondary | ICD-10-CM | POA: Diagnosis not present

## 2014-12-17 DIAGNOSIS — Z7189 Other specified counseling: Secondary | ICD-10-CM

## 2014-12-17 DIAGNOSIS — Z5181 Encounter for therapeutic drug level monitoring: Secondary | ICD-10-CM | POA: Diagnosis not present

## 2014-12-17 DIAGNOSIS — I5031 Acute diastolic (congestive) heart failure: Secondary | ICD-10-CM | POA: Diagnosis not present

## 2014-12-17 LAB — POCT INR: INR: 2.8

## 2014-12-17 NOTE — Patient Instructions (Signed)
You are doing well. No medication changes were made.  Please call us if you have new issues that need to be addressed before your next appt.  Your physician wants you to follow-up in: 6 months.  You will receive a reminder letter in the mail two months in advance. If you don't receive a letter, please call our office to schedule the follow-up appointment.   

## 2014-12-17 NOTE — Assessment & Plan Note (Signed)
Cholesterol is at goal on the current lipid regimen. No changes to the medications were made.  

## 2014-12-17 NOTE — Assessment & Plan Note (Signed)
Asymptomatic bradycardia. Blood pressure low but stable. Discussed with her, no indication for pacemaker at this time

## 2014-12-17 NOTE — Progress Notes (Signed)
Patient ID: Elizabeth [name], female    DOB: 02-03-1941, 74 y.o.   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 Florinef was started earlier in 2012 with improvement of her symptoms and the ability to work with physical therapy.  history of aortic atherosclerosis, hyperlipidemia, labile blood pressures  She has vision problems, continues to have balance problems, and was told that she has vascular dementia. Previous diagnosis of yeast esophagitis, being treated with fluconazole. She presents today for follow-up of her orthostatic hypotension  In general she has been feeling well on Florinef 0.1 mg daily. She has not been taking midodrine.  Blood pressure continues to run low in the morning. She has nocturia possibly leading to her low pressures. Blood pressure does seem to improve through the day and after getting dressed and bathed in the morning, she is sitting in a chair most of the day. She is currently not taking significant torsemide, weight stable  She reports having acute onset of vision loss bilaterally for Christmas 2015. This lasted for several hours She was seen by neurology, vascular surgery. Details unavailable though husband describes possible vertebral artery disease, though this was felt to perhaps be a chronic issue. She has continued on her warfarin with  no further symptoms.  EKG on today's visit shows sinus bradycardia with rate 49 bpm, no significant ST or T-wave changes  Other past medical history  early 2015, weight was up to 170 pounds, she had massive fluid retention, leg edema. She reported that Lasix was not helping her symptoms.  previous Chest x-ray showed bilateral pleural effusions.   She was started on torsemide 40 mg daily and has had significant weight loss. Weight was stable at around 132 pounds and she was holding the torsemide   Previously tried on Florinef 0.2 mg daily with weight gain, high blood pressure  Recent lab work showing normal LFTs, hemoglobin A1c 6.1, total cholesterol 138, LDL 54, creatinine 1.01 with BUN 30  Previous Echo several years ago showed EF 50-55% with mild AI.     Carotid u/s 11/09. normal.     Allergies  Allergen Reactions  . Erythromycin Nausea And Vomiting  . Fentanyl Nausea And Vomiting  . Percocet [Oxycodone-Acetaminophen] Nausea And Vomiting    Outpatient Encounter Prescriptions as of 12/17/2014  Medication Sig  . atorvastatin (LIPITOR) 10 MG tablet Take 1 tablet (10 mg total) by mouth every other day.  . cholecalciferol (VITAMIN D) 1000 UNITS tablet Take 2,000 Units by mouth daily.   Marland Kitchen docusate sodium (COLACE) 100 MG capsule Take 100 mg by mouth daily as needed.   Marland Kitchen escitalopram (LEXAPRO) 10 MG tablet Take 10 mg by mouth daily.  . ferrous sulfate (SM IRON) 325 (65 FE) MG tablet Take 65 mg by mouth daily.   . fludrocortisone (FLORINEF) 0.1 MG tablet Take 1 tablet (0.1 mg total) by mouth daily.  Marland Kitchen HYDROcodone-acetaminophen (NORCO/VICODIN) 5-325 MG per tablet Take 2 tablets by  mouth 2 (two) times daily as needed for moderate pain.   . hyoscyamine (ANASPAZ) 0.125 MG TBDP disintergrating tablet Place 0.125 mg under the tongue as needed for cramping.   . insulin glargine (LANTUS) 100 UNIT/ML injection Inject 9 Units into the skin every morning.   . insulin lispro (HUMALOG) 100 UNIT/ML  injection Inject 2-9 Units into the skin 4 (four) times daily -  before meals and at bedtime. Per sliding scale.  . levothyroxine (SYNTHROID, LEVOTHROID) 175 MCG tablet Take 150 mcg by mouth daily.   . Magnesium 250 MG TABS Take 800 mg by mouth 2 (two) times daily.   . midodrine (PROAMATINE) 10 MG tablet Take 1 tablet (10 mg total) by mouth 3 (three) times daily as needed.  . mirtazapine (REMERON) 7.5 MG tablet Take 7.5 mg by mouth at bedtime.   . Multiple Vitamin (MULTIVITAMIN WITH MINERALS) TABS Take 1 tablet by mouth daily.  . mycophenolate (MYFORTIC) 360 MG TBEC Take 360 mg by mouth 2 (two) times daily.   . nitroGLYCERIN (NITROSTAT) 0.4 MG SL tablet Place 1 tablet (0.4 mg total) under the tongue every 5 (five) minutes as needed for chest pain.  Marland Kitchen omeprazole (PRILOSEC) 20 MG capsule Take 20 mg by mouth at bedtime.   . ondansetron (ZOFRAN) 4 MG tablet Take 4 mg by mouth once.  . potassium chloride (K-DUR) 10 MEQ tablet Take 10 mEq by mouth daily at 12 noon.   . predniSONE (DELTASONE) 5 MG tablet Take 5 mg by mouth every morning.   . promethazine (PHENERGAN) 12.5 MG tablet Take 12.5 mg by mouth as needed for nausea or vomiting.   . tacrolimus (PROGRAF) 0.5 MG capsule Take 1-2 mg by mouth 2 (two) times daily. Take 2mg  in the am & 1mg  at night  . tamsulosin (FLOMAX) 0.4 MG CAPS capsule Take 0.4 mg by mouth daily.  Marland Kitchen warfarin (COUMADIN) 2.5 MG tablet TAKE AS DIRECTED BY COUMADIN CLINIC    Past Medical History  Diagnosis Date  . Coronary artery disease   . Chronic kidney disease     s/p cadaveric renal transplant in 2001  . Diabetes mellitus   . PVD (peripheral vascular disease)   . History of orthostatic hypotension   . Hyperlipidemia   . Hypothyroidism   . Anemia   . Pancreatic cyst   . Osteoporosis     recurrent fractures  . Melanoma   . Carotid bruit     left  . Adenomatous polyp   . GERD (gastroesophageal reflux disease)   . Gastritis   . CVA (cerebral infarction)   .  Bradycardia   . Renal artery stenosis   . Diabetic retinopathy   . Renal osteodystrophy   . Hyperparathyroidism   . Leg ulcer     from poor fitting prosthetic  . H/O immunosuppressive therapy   . 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 Fx wrist   . Fx ankle   . Neuropathy   . Nephropathy   . Carotid artery stenosis     mild  . Stroke   . Tubular adenoma   . Colitis   . IBS (irritable bowel syndrome)   . Ischemic colitis   . 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 Esophageal candidiasis   . 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 Fracture of right maxilla 09/21/2012  . Right orbit fracture 09/21/2012  .  TBI (traumatic brain injury) 09/30/2012  . Zygoma fracture 09/21/2012  . Compression fracture of L4 lumbar vertebra   . Pulmonary embolism     Past Surgical History  Procedure Laterality Date  . Kidney transplant  08/15/2000    [facility] in [place]  . 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 Appendectomy    . Cataract extraction    . Cholecystectomy    . Abdominal hysterectomy    . Thyroid surgery    . Tonsillectomy    . Cesarean section      x 2  . Vitrectomy      right eye  . Coronary artery bypass graft    . Colonoscopy w/ biopsies and polypectomy  02/18/2009    adenomatous polyps, diverticulosis, internal hemorrhoids  . Eus  02/04/2010    w/FNA, pancreatic cyst  . Upper gastrointestinal endoscopy  12/01/2009  . Colonoscopy  01/30/2012    Procedure: COLONOSCOPY;  Surgeon: Milus Banister, MD;  Location: [facility];  Service: Endoscopy;  Laterality: N/A;  . Esophagogastroduodenoscopy  05/02/2012    Procedure: ESOPHAGOGASTRODUODENOSCOPY (EGD);  Surgeon: Gatha Mayer, MD;  Location: [facility];  Service: Endoscopy;  Laterality: N/A;    Social History  reports that she has never smoked. She has never used smokeless tobacco. She  reports that she does not drink alcohol or use illicit drugs.  Family History family history includes Heart attack (age of onset: 11) in her father; Stroke (age of onset: 50) in her mother. There is no history of Colon cancer or Diabetes.   Review of Systems  Constitutional: Negative.   Respiratory: Negative.   Cardiovascular: Negative.   Gastrointestinal: Negative.   Musculoskeletal: Positive for gait problem.  Skin: Negative.   Neurological: Negative.   Hematological: Negative.   Psychiatric/Behavioral: Negative.   All other systems reviewed and are negative.   BP 109/65 mmHg  Pulse 49  Ht 5\' 4"  (1.626 m)  Wt 135 lb (61.236 kg)  BMI 23.16 kg/m2  Physical Exam  Constitutional: She is oriented to person, place, and time. She appears well-developed and well-nourished.   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   HENT:  Head: Normocephalic.  Nose: Nose normal.  Mouth/Throat: Oropharynx is clear and moist.  Eyes: Conjunctivae are normal. Pupils are equal, round, and reactive to light.  Neck: Normal range of motion. Neck supple. No JVD present.  Cardiovascular: Normal rate, regular rhythm, S1 normal, S2 normal, normal heart sounds and intact distal pulses.  Exam reveals no gallop and no friction rub.   No murmur heard. Trace edema of the right lower extremity  Pulmonary/Chest: Effort normal and breath sounds normal. No respiratory distress. She has no wheezes. She has no rales. She exhibits no tenderness.  dullness at the bases bilaterally  Abdominal: Soft. Bowel sounds are normal. She exhibits no distension. There is no tenderness.  Musculoskeletal: Normal range of motion. She exhibits no edema or tenderness.  Lymphadenopathy:    She has no cervical adenopathy.  Neurological: She is alert and oriented to person, place, and time. Coordination normal.  Skin: Skin is warm and dry. No rash noted. No erythema.  Psychiatric: She has a normal mood and affect. Her  behavior is normal. Judgment and thought content normal.    Assessment and Plan  Nursing note and vitals reviewed.

## 2014-12-17 NOTE — Assessment & Plan Note (Signed)
Stable on Florinef daily, not taking midodrine

## 2014-12-17 NOTE — Assessment & Plan Note (Signed)
Currently with no symptoms of angina. No further workup at this time. Continue current medication regimen. 

## 2014-12-17 NOTE — Assessment & Plan Note (Signed)
She appears relatively euvolemic. Husband reports she has an occasional rattle or cough. Suggested if symptoms persist, particularly when laying supine, would consider taking torsemide especially in the setting of weight gain Weight is up 3-5 pounds since her last clinic visit

## 2014-12-17 NOTE — Discharge Summary (Signed)
PATIENT NAME:  Elizabeth [name], Elizabeth [name] MR#:  [id number] DATE OF BIRTH:  01-Nov-1940  DATE OF ADMISSION:  08/13/2014 DATE OF DISCHARGE:  08/15/2014  DISCHARGE DIAGNOSES:  1.  Transient ischemic attack with vision loss.  2.  Paroxysmal atrial fibrillation.  3.  Hypertension.  4.  Right internal carotid artery stenosis.  5.  Right lower lobe pneumonia.   DISCHARGE MEDICATIONS:  1.  Humalog sliding scale.  2.  Hyoscyamine 0.125 mg oral 4 times a day as needed.  3.  Calcium with vitamin D 2 tablets orally once a day.  4.  Magnesium hydroxide 8% 30 mL 2 times a day as needed.  5.  Fludrocortisone 0.1 mg oral 1 tablet every other day.  6.  Lantus 8 units subcutaneous once a day.  7.  Myfortic 360 mg 2 times a day.  8.  Prilosec 20 mg daily.  9.  Prednisone 5 mg daily.  10.  Prograf 0.5 mg 2 capsules once a day.  11.  Synthroid 175 mcg daily. 12.  Warfarin 2.5 mg daily.  13.  Potassium chloride 20 mEq daily.  14.  Lexapro 10 mg daily.  15.  Flomax 0.4 mg oral once a day. 16.  Remeron 15 mg oral once a day.  17.  Atorvastatin 10 mg daily.  18.  Levaquin 5 mg once a day for 7 days.   DISCHARGE INSTRUCTIONS: Low-sodium diet. Activity as tolerated. Follow up with Dr. Manuella Ghazi or Dr. Melrose Nakayama of neurology in 1-2 weeks, and PCP in 1-2 weeks. The patient also needs followup with vascular surgery, Dr. Lucky Cowboy or Dr. Delana Meyer, or 1-2 weeks for right ICA stenosis.   CONSULTS: Leotis Pain, MD, with neurology.   IMAGING STUDIES: CT scan of the head without contrast showed nothing acute, posterior circulation chronic strokes.  Carotid Doppler showed arteriosclerotic plaque less than 50%.  MRI and MRA of the brain showed no acute stroke or hemorrhage, and multiple chronic infarcts some of which are remotely hemorrhagic. She does have right supraclinoid ICA estimated 75% stenosis. No PCA disease.   ADMITTING HISTORY AND PHYSICAL: Please see detailed H and P dictated by Dr. Leslye Peer. In brief, a 74 year old patient  presented to the hospital from her ophthalmologist's office. The patient, with chronic blurred vision, presented to her ophthalmologist's office after she had acute bilateral vision loss. She was referred to Dr. Scharlene Gloss office secondary to some temporal tenderness, for possible temporal arteritis. The patient was referred to the ER from there. Here, she was admitted to the hospitalist service.   HOSPITAL COURSE:  1.  Acute vision loss, likely from TIA. The patient's vision slowly improved. CT scan showed nothing acute. It did show posterior circulation old strokes. The patient was seen by neurology, who ordered an MRI/MRA, which showed nothing acute other than the supraclinoid right ICA stenosis with reconstitution. The patient will be referred to vascular surgery for the same. She continues to be on Coumadin for her paroxysmal atrial fibrillation, along with a statin. Her symptoms have resolved. No other focal neurological deficits, and she is being discharged home in a stable condition. The patient's ESR and CRP have been normal, and temporal arteritis has been ruled out.  2.  Right lower lobe pneumonia. The patient has had some cough. No shortness of breath or fever, but be treated with Levaquin for 7 days.  3.  All of her other comorbid conditions have been stable during the hospital stay.   TIME SPENT ON DAY OF DISCHARGE IN DISCHARGE ACTIVITY:  35 minutes.    ____________________________ Leia Alf Juley Giovanetti, MD srs:MT D: 08/15/2014 13:02:12 ET T: 08/15/2014 13:53:06 ET JOB#: [id number]  cc: Alveta Heimlich R. Tyreke Kaeser, MD, <Dictator> Donato Heinz, MD Algernon Huxley, MD Doneta Public Melrose Nakayama, MD Neita Carp MD ELECTRONICALLY SIGNED 09/05/2014 12:46

## 2014-12-17 NOTE — Assessment & Plan Note (Signed)
Tolerating warfarin. No recent bleeding. Recent vision loss bilaterally, etiology unclear.

## 2015-01-21 ENCOUNTER — Ambulatory Visit (INDEPENDENT_AMBULATORY_CARE_PROVIDER_SITE_OTHER): Payer: Medicare Other

## 2015-01-21 DIAGNOSIS — Z5181 Encounter for therapeutic drug level monitoring: Secondary | ICD-10-CM | POA: Diagnosis not present

## 2015-01-21 DIAGNOSIS — I639 Cerebral infarction, unspecified: Secondary | ICD-10-CM | POA: Diagnosis not present

## 2015-01-21 DIAGNOSIS — I4891 Unspecified atrial fibrillation: Secondary | ICD-10-CM

## 2015-01-21 DIAGNOSIS — I82629 Acute embolism and thrombosis of deep veins of unspecified upper extremity: Secondary | ICD-10-CM | POA: Diagnosis not present

## 2015-01-21 LAB — POCT INR: INR: 3

## 2015-02-20 ENCOUNTER — Telehealth: Payer: Self-pay

## 2015-02-20 ENCOUNTER — Other Ambulatory Visit: Payer: Self-pay

## 2015-02-20 ENCOUNTER — Encounter: Payer: Self-pay | Admitting: Cardiovascular Disease

## 2015-02-20 NOTE — Telephone Encounter (Signed)
Error

## 2015-02-24 NOTE — Telephone Encounter (Signed)
Please review

## 2015-02-24 NOTE — Telephone Encounter (Signed)
Please review my chart message on this patient and advise what dose of torsemide she needs.

## 2015-02-25 ENCOUNTER — Other Ambulatory Visit: Payer: Self-pay | Admitting: Cardiovascular Disease

## 2015-02-25 MED ORDER — TORSEMIDE 20 MG PO TABS: 20.0000 mg | ORAL_TABLET | Freq: Two times a day (BID) | ORAL | Status: DC | PRN

## 2015-03-04 ENCOUNTER — Ambulatory Visit (INDEPENDENT_AMBULATORY_CARE_PROVIDER_SITE_OTHER): Payer: Medicare Other | Admitting: *Deleted

## 2015-03-04 DIAGNOSIS — I82629 Acute embolism and thrombosis of deep veins of unspecified upper extremity: Secondary | ICD-10-CM

## 2015-03-04 DIAGNOSIS — I4891 Unspecified atrial fibrillation: Secondary | ICD-10-CM

## 2015-03-04 DIAGNOSIS — Z5181 Encounter for therapeutic drug level monitoring: Secondary | ICD-10-CM

## 2015-03-04 DIAGNOSIS — I639 Cerebral infarction, unspecified: Secondary | ICD-10-CM

## 2015-03-04 LAB — POCT INR: INR: 3.2

## 2015-04-01 ENCOUNTER — Ambulatory Visit (INDEPENDENT_AMBULATORY_CARE_PROVIDER_SITE_OTHER): Payer: Medicare Other | Admitting: *Deleted

## 2015-04-01 DIAGNOSIS — I82629 Acute embolism and thrombosis of deep veins of unspecified upper extremity: Secondary | ICD-10-CM

## 2015-04-01 DIAGNOSIS — Z5181 Encounter for therapeutic drug level monitoring: Secondary | ICD-10-CM

## 2015-04-01 DIAGNOSIS — I639 Cerebral infarction, unspecified: Secondary | ICD-10-CM | POA: Diagnosis not present

## 2015-04-01 DIAGNOSIS — I4891 Unspecified atrial fibrillation: Secondary | ICD-10-CM | POA: Diagnosis not present

## 2015-04-01 LAB — POCT INR: INR: 2.9

## 2015-04-29 ENCOUNTER — Ambulatory Visit (INDEPENDENT_AMBULATORY_CARE_PROVIDER_SITE_OTHER): Payer: Medicare Other

## 2015-04-29 DIAGNOSIS — I82629 Acute embolism and thrombosis of deep veins of unspecified upper extremity: Secondary | ICD-10-CM

## 2015-04-29 DIAGNOSIS — Z5181 Encounter for therapeutic drug level monitoring: Secondary | ICD-10-CM | POA: Diagnosis not present

## 2015-04-29 DIAGNOSIS — I4891 Unspecified atrial fibrillation: Secondary | ICD-10-CM

## 2015-04-29 DIAGNOSIS — I639 Cerebral infarction, unspecified: Secondary | ICD-10-CM

## 2015-04-29 LAB — POCT INR: INR: 2.6

## 2015-05-27 ENCOUNTER — Ambulatory Visit (INDEPENDENT_AMBULATORY_CARE_PROVIDER_SITE_OTHER): Payer: Medicare Other

## 2015-05-27 DIAGNOSIS — Z5181 Encounter for therapeutic drug level monitoring: Secondary | ICD-10-CM

## 2015-05-27 DIAGNOSIS — I4891 Unspecified atrial fibrillation: Secondary | ICD-10-CM

## 2015-05-27 DIAGNOSIS — I639 Cerebral infarction, unspecified: Secondary | ICD-10-CM

## 2015-05-27 LAB — POCT INR: INR: 1.6

## 2015-06-10 ENCOUNTER — Ambulatory Visit (INDEPENDENT_AMBULATORY_CARE_PROVIDER_SITE_OTHER): Payer: Medicare Other

## 2015-06-10 DIAGNOSIS — I639 Cerebral infarction, unspecified: Secondary | ICD-10-CM

## 2015-06-10 DIAGNOSIS — I4891 Unspecified atrial fibrillation: Secondary | ICD-10-CM

## 2015-06-10 DIAGNOSIS — Z5181 Encounter for therapeutic drug level monitoring: Secondary | ICD-10-CM | POA: Diagnosis not present

## 2015-06-10 LAB — POCT INR: INR: 2.3

## 2015-07-01 ENCOUNTER — Ambulatory Visit (INDEPENDENT_AMBULATORY_CARE_PROVIDER_SITE_OTHER): Payer: Medicare Other | Admitting: *Deleted

## 2015-07-01 ENCOUNTER — Ambulatory Visit (INDEPENDENT_AMBULATORY_CARE_PROVIDER_SITE_OTHER): Payer: Medicare Other | Admitting: Cardiovascular Disease

## 2015-07-01 ENCOUNTER — Encounter: Payer: Self-pay | Admitting: Cardiovascular Disease

## 2015-07-01 VITALS — BP 98/56 | HR 53 | Ht 64.0 in | Wt 138.0 lb

## 2015-07-01 DIAGNOSIS — I5031 Acute diastolic (congestive) heart failure: Secondary | ICD-10-CM | POA: Diagnosis not present

## 2015-07-01 DIAGNOSIS — K551 Chronic vascular disorders of intestine: Secondary | ICD-10-CM

## 2015-07-01 DIAGNOSIS — I495 Sick sinus syndrome: Secondary | ICD-10-CM | POA: Diagnosis not present

## 2015-07-01 DIAGNOSIS — I2510 Atherosclerotic heart disease of native coronary artery without angina pectoris: Secondary | ICD-10-CM

## 2015-07-01 DIAGNOSIS — Z5181 Encounter for therapeutic drug level monitoring: Secondary | ICD-10-CM | POA: Diagnosis not present

## 2015-07-01 DIAGNOSIS — E1159 Type 2 diabetes mellitus with other circulatory complications: Secondary | ICD-10-CM

## 2015-07-01 DIAGNOSIS — I4891 Unspecified atrial fibrillation: Secondary | ICD-10-CM

## 2015-07-01 DIAGNOSIS — I639 Cerebral infarction, unspecified: Secondary | ICD-10-CM

## 2015-07-01 DIAGNOSIS — I951 Orthostatic hypotension: Secondary | ICD-10-CM | POA: Diagnosis not present

## 2015-07-01 LAB — POCT INR: INR: 4

## 2015-07-01 NOTE — Assessment & Plan Note (Signed)
Periodic weight gain approximately once per month responding to torsemide  Weight up to 145 pounds at times , down to 135 pounds after diuresis

## 2015-07-01 NOTE — Assessment & Plan Note (Signed)
Unable to exercise, weight has been stable  labwork done through nephrology in [facility]

## 2015-07-01 NOTE — Progress Notes (Signed)
Patient ID: Elizabeth [name], female    DOB: 24-Jun-1941, 74 y.o.   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 Florinef was started earlier in 2012 with improvement of her symptoms and the ability to work with physical therapy.  history of aortic atherosclerosis, hyperlipidemia, labile blood pressures  She has vision problems, continues to have balance problems, and was told that she has vascular dementia. Previous diagnosis of yeast esophagitis, being treated with fluconazole. She presents today for follow-up of her orthostatic hypotension  In follow-up today, she is not taking Florinef Weight will trend up on its own, then husband provides torsemide several doses weight fluctuated between 135 pounds in 145 pounds   she does not stand on a regular basis, predominantly in her wheelchair Legs are weak, also balance issues secondary to vision problems from old stroke   she reports weight is stable, good appetite She does have chronic nausea, occasional vomiting 1 or 2 times per week Was previously on Zofran, this was held by her husband. Unclear if medication was helping with her symptoms  No recent blood work available in our system EKG showing sinus bradycardia with rate 53 bpm, no significant ST or T-wave changes  Other past medical history previously on Florinef 0.1  mg daily.  was taking midodrine in the past, not for quite some time  Blood pressure continues to run low in the morning. She has nocturia possibly leading to her low pressures. Blood pressure does seem to improve through the day and after getting dressed and bathed in the morning, she is sitting in a chair most of the day. She is currently not taking significant torsemide, weight stable  She reports having acute onset of vision loss bilaterally for Christmas 2015. This lasted for several hours She was seen by neurology, vascular surgery. Details unavailable though husband describes possible vertebral artery disease, though this was felt to perhaps be a chronic issue. She has continued on her warfarin with no further symptoms.  EKG on today's visit shows sinus bradycardia with rate 49 bpm, no significant ST or T-wave changes  Other past medical history  early 2015, weight was up to 170 pounds, she had massive fluid retention, leg edema. She reported that Lasix was not helping her symptoms.  previous Chest x-ray showed bilateral pleural effusions.   She was started on torsemide 40 mg daily and has had significant weight loss. Weight was stable at around 132 pounds and she was holding the torsemide   Previously tried on Florinef 0.2 mg daily with weight gain, high blood pressure  Recent lab work showing normal LFTs, hemoglobin A1c 6.1, total cholesterol 138, LDL 54, creatinine 1.01 with BUN 30  Previous Echo several years ago showed EF 50-55% with mild AI.     Carotid u/s 11/09. normal.     Allergies  Allergen Reactions  . Erythromycin Nausea And Vomiting  . Fentanyl Nausea And Vomiting  . Percocet [Oxycodone-Acetaminophen]  Nausea And Vomiting    Outpatient Encounter Prescriptions as of 07/01/2015  Medication Sig  . atorvastatin (LIPITOR) 10 MG tablet Take 1 tablet (10 mg total) by mouth every other day.  . cholecalciferol (VITAMIN D) 1000 UNITS tablet Take 2,000 Units by mouth daily.    Marland Kitchen docusate sodium (COLACE) 100 MG capsule Take 100 mg by mouth daily as needed.   Marland Kitchen escitalopram (LEXAPRO) 10 MG tablet Take 10 mg by mouth daily.  . ferrous sulfate (SM IRON) 325 (65 FE) MG tablet Take 65 mg by mouth daily.   Marland Kitchen HYDROcodone-acetaminophen (NORCO/VICODIN) 5-325 MG per tablet Take 2 tablets by mouth 2 (two) times daily as needed for moderate pain.   . hyoscyamine (ANASPAZ) 0.125 MG TBDP disintergrating tablet Place 0.125 mg under the tongue as needed for cramping.   . insulin glargine (LANTUS) 100 UNIT/ML injection Inject 9 Units into the skin every morning.   . insulin lispro (HUMALOG) 100 UNIT/ML injection Inject 2-9 Units into the skin 4 (four) times daily -  before meals and at bedtime. Per sliding scale.  . levothyroxine (SYNTHROID, LEVOTHROID) 150 MCG tablet Take 150 mcg by mouth daily before breakfast.  . Magnesium 250 MG TABS Take 700 mg by mouth 2 (two) times daily.   . midodrine (PROAMATINE) 10 MG tablet Take 1 tablet (10 mg total) by mouth 3 (three) times daily as needed.  . mirtazapine (REMERON) 7.5 MG tablet Take 7.5 mg by mouth at bedtime.   . Multiple Vitamin (MULTIVITAMIN WITH MINERALS) TABS Take 1 tablet by mouth daily.  . mycophenolate (MYFORTIC) 360 MG TBEC Take 360 mg by mouth 2 (two) times daily.   . nitroGLYCERIN (NITROSTAT) 0.4 MG SL tablet Place 1 tablet (0.4 mg total) under the tongue every 5 (five) minutes as needed for chest pain.  Marland Kitchen omeprazole (PRILOSEC) 20 MG capsule Take 20 mg by mouth every other day.   . ondansetron (ZOFRAN) 4 MG tablet Take 4 mg by mouth as needed.   . potassium chloride (K-DUR) 10 MEQ tablet Take 10 mEq by mouth daily at 12 noon.   . predniSONE (DELTASONE) 5 MG tablet Take 5 mg by mouth every morning.   . promethazine (PHENERGAN) 12.5 MG tablet Take 12.5 mg by mouth as needed for nausea or vomiting.   . tacrolimus (PROGRAF) 0.5 MG capsule Take 1-2 mg by mouth 2 (two) times daily. Take 2mg  in the am & 1mg  at night  . tamsulosin  (FLOMAX) 0.4 MG CAPS capsule Take 0.4 mg by mouth every other day.   . torsemide (DEMADEX) 20 MG tablet Take 1 tablet (20 mg total) by mouth 2 (two) times daily as needed.  . warfarin (COUMADIN) 2.5 MG tablet TAKE AS DIRECTED BY COUMADIN CLINIC  . [DISCONTINUED] fludrocortisone (FLORINEF) 0.1 MG tablet Take 1 tablet (0.1 mg total) by mouth daily.  . [DISCONTINUED] levothyroxine (SYNTHROID, LEVOTHROID) 175 MCG tablet Take 150 mcg by mouth daily.    No facility-administered encounter medications on file as of 07/01/2015.    Past Medical History  Diagnosis Date  . Coronary artery disease   . Chronic kidney disease     s/p cadaveric renal transplant in 2001  . Diabetes mellitus   . PVD (peripheral vascular disease) ([place])   . History of orthostatic hypotension   . Hyperlipidemia   . Hypothyroidism   . Anemia   . Pancreatic cyst   . Osteoporosis     recurrent fractures  . Melanoma ([place])   . Carotid bruit  left  . Adenomatous polyp   . GERD (gastroesophageal reflux disease)   . Gastritis   . CVA (cerebral infarction)   . Bradycardia   . Renal artery stenosis ([place])   . Diabetic retinopathy   . Renal osteodystrophy   . Hyperparathyroidism   . Leg ulcer ([place])     from poor fitting prosthetic  . H/O immunosuppressive therapy   . 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 Fx wrist   . Fx ankle   . Neuropathy ([place])   . Nephropathy   . Carotid artery stenosis     mild  . Stroke ([place])   . Tubular adenoma   . Colitis   . IBS (irritable bowel syndrome)   . Ischemic colitis ([place])   . 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 Esophageal candidiasis ([place])   . 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 Fracture of right maxilla 09/21/2012  . Right orbit fracture ([place]) 09/21/2012  . TBI (traumatic brain injury) ([place]) 09/30/2012  . Zygoma fracture ([place]) 09/21/2012  . Compression fracture of  L4 lumbar vertebra ([facility])   . Pulmonary embolism [facility])     Past Surgical History  Procedure Laterality Date  . Kidney transplant  08/15/2000    [facility] in [place]  . 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 Appendectomy    . Cataract extraction    . Cholecystectomy    . Abdominal hysterectomy    . Thyroid surgery    . Tonsillectomy    . Cesarean section      x 2  . Vitrectomy      right eye  . Coronary artery bypass graft    . Colonoscopy w/ biopsies and polypectomy  02/18/2009    adenomatous polyps, diverticulosis, internal hemorrhoids  . Eus  02/04/2010    w/FNA, pancreatic cyst  . Upper gastrointestinal endoscopy  12/01/2009  . Colonoscopy  01/30/2012    Procedure: COLONOSCOPY;  Surgeon: Milus Banister, MD;  Location: [facility];  Service: Endoscopy;  Laterality: N/A;  . Esophagogastroduodenoscopy  05/02/2012    Procedure: ESOPHAGOGASTRODUODENOSCOPY (EGD);  Surgeon: Gatha Mayer, MD;  Location: [facility];  Service: Endoscopy;  Laterality: N/A;    Social History  reports that she has never smoked. She has never used smokeless tobacco. She reports that she does not drink alcohol or use illicit drugs.  Family History family history includes Heart attack (age of onset: 38) in her father; Stroke (age of onset: 57) in her mother. There is no history of Colon cancer or Diabetes.   Review of Systems  Constitutional: Negative.        Wheelchair-bound  Eyes: Positive for visual disturbance.  Respiratory: Negative.   Cardiovascular: Negative.   Gastrointestinal: Negative.   Musculoskeletal: Positive for gait problem.  Skin: Negative.   Neurological: Negative.   Hematological: Negative.   Psychiatric/Behavioral: Negative.   All other systems reviewed and are negative.   BP 98/56 mmHg  Pulse 53  Ht 5\' 4"  (1.626 m)  Wt 138 lb (62.596 kg)  BMI 23.68 kg/m2  Physical Exam  Constitutional: She is oriented to person, place, and  time. She appears well-developed and well-nourished.   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  Right lower extremity with a brace  HENT:  Head: Normocephalic.  Nose: Nose normal.  Mouth/Throat: Oropharynx is clear and moist.  Eyes: Conjunctivae are normal. Pupils are equal, round, and reactive to light.  Neck: Normal range of motion. Neck supple. No JVD present.  Cardiovascular: Normal rate, regular rhythm, S1 normal, S2 normal, normal heart sounds and intact distal pulses.  Exam reveals no gallop and no friction rub.   No murmur heard. Trace edema of the right lower extremity  Pulmonary/Chest: Effort normal and breath sounds normal. No respiratory distress. She has no wheezes. She has no rales. She exhibits no tenderness.  dullness at the bases bilaterally  Abdominal: Soft. Bowel sounds are normal. She exhibits no distension. There is no tenderness.  Musculoskeletal: Normal range of motion. She exhibits no edema or tenderness.  Lymphadenopathy:    She has no cervical adenopathy.  Neurological: She is alert and oriented to person, place, and time. Coordination normal.  Skin: Skin is warm and dry. No rash noted. No erythema.  Psychiatric: She has a normal mood and affect. Her behavior is normal. Judgment and thought content normal.    Assessment and Plan  Nursing note and vitals reviewed.

## 2015-07-01 NOTE — Assessment & Plan Note (Signed)
Long history of sinus bradycardia. asymptomatic. Marland Kitchen She and her husband were not particularly interested at this time blood pressure support with Florinef  Or midodrine if needed

## 2015-07-01 NOTE — Assessment & Plan Note (Signed)
Currently with no symptoms of angina. No further workup at this time. Continue current medication regimen. 

## 2015-07-01 NOTE — Assessment & Plan Note (Signed)
Blood pressure is low in a sitting position, does not stand very much  Currently off Florinef given  Weight gain/fluid retention  Midodrine could be used as needed for symptoms  Suggested they check blood pressure after eating, postprandial given her nausea, vomiting

## 2015-07-01 NOTE — Patient Instructions (Signed)
You are doing well. No medication changes were made.  Please call us if you have new issues that need to be addressed before your next appt.  Your physician wants you to follow-up in: 6 months.  You will receive a reminder letter in the mail two months in advance. If you don't receive a letter, please call our office to schedule the follow-up appointment.   

## 2015-07-01 NOTE — Assessment & Plan Note (Signed)
Nausea vomiting periodically but not on a regular basis, etiology unclear  His symptoms get worse, would reevaluate  Mesenteric vasculature

## 2015-07-10 ENCOUNTER — Telehealth: Payer: Self-pay | Admitting: *Deleted

## 2015-07-10 NOTE — Telephone Encounter (Signed)
Received records request [id number], forwarded to [place] for processing 07/10/15.

## 2015-07-15 ENCOUNTER — Encounter: Payer: Self-pay | Admitting: Cardiovascular Disease

## 2015-07-15 ENCOUNTER — Ambulatory Visit (INDEPENDENT_AMBULATORY_CARE_PROVIDER_SITE_OTHER): Payer: Medicare Other

## 2015-07-15 DIAGNOSIS — I639 Cerebral infarction, unspecified: Secondary | ICD-10-CM

## 2015-07-15 DIAGNOSIS — I4891 Unspecified atrial fibrillation: Secondary | ICD-10-CM

## 2015-07-15 DIAGNOSIS — Z5181 Encounter for therapeutic drug level monitoring: Secondary | ICD-10-CM

## 2015-07-15 LAB — POCT INR: INR: 2.7

## 2015-07-31 ENCOUNTER — Encounter: Payer: Self-pay | Admitting: Cardiovascular Disease

## 2015-08-05 ENCOUNTER — Ambulatory Visit (INDEPENDENT_AMBULATORY_CARE_PROVIDER_SITE_OTHER): Payer: Medicare Other

## 2015-08-05 DIAGNOSIS — Z5181 Encounter for therapeutic drug level monitoring: Secondary | ICD-10-CM | POA: Diagnosis not present

## 2015-08-05 DIAGNOSIS — I4891 Unspecified atrial fibrillation: Secondary | ICD-10-CM | POA: Diagnosis not present

## 2015-08-05 DIAGNOSIS — I639 Cerebral infarction, unspecified: Secondary | ICD-10-CM

## 2015-08-05 LAB — POCT INR: INR: 3.9

## 2015-08-26 ENCOUNTER — Ambulatory Visit (INDEPENDENT_AMBULATORY_CARE_PROVIDER_SITE_OTHER): Payer: Medicare Other

## 2015-08-26 DIAGNOSIS — I639 Cerebral infarction, unspecified: Secondary | ICD-10-CM

## 2015-08-26 DIAGNOSIS — I4891 Unspecified atrial fibrillation: Secondary | ICD-10-CM | POA: Diagnosis not present

## 2015-08-26 DIAGNOSIS — Z5181 Encounter for therapeutic drug level monitoring: Secondary | ICD-10-CM

## 2015-08-26 LAB — POCT INR: INR: 3

## 2015-09-23 ENCOUNTER — Ambulatory Visit (INDEPENDENT_AMBULATORY_CARE_PROVIDER_SITE_OTHER): Payer: Medicare Other

## 2015-09-23 DIAGNOSIS — I639 Cerebral infarction, unspecified: Secondary | ICD-10-CM | POA: Diagnosis not present

## 2015-09-23 DIAGNOSIS — Z5181 Encounter for therapeutic drug level monitoring: Secondary | ICD-10-CM

## 2015-09-23 DIAGNOSIS — I4891 Unspecified atrial fibrillation: Secondary | ICD-10-CM

## 2015-09-23 LAB — POCT INR: INR: 3.9

## 2015-10-07 ENCOUNTER — Ambulatory Visit (INDEPENDENT_AMBULATORY_CARE_PROVIDER_SITE_OTHER): Payer: Medicare Other

## 2015-10-07 DIAGNOSIS — Z5181 Encounter for therapeutic drug level monitoring: Secondary | ICD-10-CM | POA: Diagnosis not present

## 2015-10-07 DIAGNOSIS — I639 Cerebral infarction, unspecified: Secondary | ICD-10-CM

## 2015-10-07 DIAGNOSIS — I4891 Unspecified atrial fibrillation: Secondary | ICD-10-CM

## 2015-10-07 LAB — POCT INR: INR: 2.6

## 2015-10-28 ENCOUNTER — Ambulatory Visit (INDEPENDENT_AMBULATORY_CARE_PROVIDER_SITE_OTHER): Payer: Medicare Other

## 2015-10-28 DIAGNOSIS — I4891 Unspecified atrial fibrillation: Secondary | ICD-10-CM

## 2015-10-28 DIAGNOSIS — I639 Cerebral infarction, unspecified: Secondary | ICD-10-CM

## 2015-10-28 DIAGNOSIS — Z5181 Encounter for therapeutic drug level monitoring: Secondary | ICD-10-CM | POA: Diagnosis not present

## 2015-10-28 LAB — POCT INR: INR: 3.4

## 2015-11-18 ENCOUNTER — Ambulatory Visit (INDEPENDENT_AMBULATORY_CARE_PROVIDER_SITE_OTHER): Payer: Medicare Other

## 2015-11-18 DIAGNOSIS — Z5181 Encounter for therapeutic drug level monitoring: Secondary | ICD-10-CM

## 2015-11-18 DIAGNOSIS — I639 Cerebral infarction, unspecified: Secondary | ICD-10-CM | POA: Diagnosis not present

## 2015-11-18 DIAGNOSIS — I4891 Unspecified atrial fibrillation: Secondary | ICD-10-CM

## 2015-11-18 LAB — POCT INR: INR: 2.3

## 2015-12-03 ENCOUNTER — Encounter: Payer: Self-pay | Admitting: Internal Medicine

## 2015-12-16 ENCOUNTER — Ambulatory Visit (INDEPENDENT_AMBULATORY_CARE_PROVIDER_SITE_OTHER): Payer: Medicare Other

## 2015-12-16 DIAGNOSIS — I639 Cerebral infarction, unspecified: Secondary | ICD-10-CM

## 2015-12-16 DIAGNOSIS — I4891 Unspecified atrial fibrillation: Secondary | ICD-10-CM

## 2015-12-16 DIAGNOSIS — Z5181 Encounter for therapeutic drug level monitoring: Secondary | ICD-10-CM | POA: Diagnosis not present

## 2015-12-16 LAB — POCT INR: INR: 2.2

## 2016-01-20 ENCOUNTER — Ambulatory Visit (INDEPENDENT_AMBULATORY_CARE_PROVIDER_SITE_OTHER): Payer: Medicare Other

## 2016-01-20 DIAGNOSIS — I639 Cerebral infarction, unspecified: Secondary | ICD-10-CM

## 2016-01-20 DIAGNOSIS — I4891 Unspecified atrial fibrillation: Secondary | ICD-10-CM | POA: Diagnosis not present

## 2016-01-20 DIAGNOSIS — Z5181 Encounter for therapeutic drug level monitoring: Secondary | ICD-10-CM | POA: Diagnosis not present

## 2016-01-20 LAB — POCT INR: INR: 2.5

## 2016-02-05 ENCOUNTER — Encounter: Payer: Self-pay | Admitting: Cardiovascular Disease

## 2016-02-05 ENCOUNTER — Ambulatory Visit (INDEPENDENT_AMBULATORY_CARE_PROVIDER_SITE_OTHER): Payer: Medicare Other | Admitting: Cardiovascular Disease

## 2016-02-05 VITALS — BP 116/68 | HR 53 | Ht 63.5 in | Wt 136.2 lb

## 2016-02-05 DIAGNOSIS — I951 Orthostatic hypotension: Secondary | ICD-10-CM

## 2016-02-05 DIAGNOSIS — I5031 Acute diastolic (congestive) heart failure: Secondary | ICD-10-CM

## 2016-02-05 DIAGNOSIS — I4891 Unspecified atrial fibrillation: Secondary | ICD-10-CM | POA: Diagnosis not present

## 2016-02-05 DIAGNOSIS — I6310 Cerebral infarction due to embolism of unspecified precerebral artery: Secondary | ICD-10-CM

## 2016-02-05 DIAGNOSIS — I2510 Atherosclerotic heart disease of native coronary artery without angina pectoris: Secondary | ICD-10-CM | POA: Diagnosis not present

## 2016-02-05 DIAGNOSIS — I495 Sick sinus syndrome: Secondary | ICD-10-CM

## 2016-02-05 DIAGNOSIS — I10 Essential (primary) hypertension: Secondary | ICD-10-CM

## 2016-02-05 DIAGNOSIS — I639 Cerebral infarction, unspecified: Secondary | ICD-10-CM | POA: Diagnosis not present

## 2016-02-05 DIAGNOSIS — E785 Hyperlipidemia, unspecified: Secondary | ICD-10-CM

## 2016-02-05 NOTE — Progress Notes (Signed)
Patient ID: Elizabeth [name], female   DOB: [birth year], 75 y.o.   MRN: [id number] Cardiology Office Note  Date:  02/05/2016   ID:  Elizabeth [name], DOB 11-17-1940, MRN [id number]  PCP:  Donetta Potts, MD   Chief Complaint  Patient presents with  . other    6 month f/u no complaints. Meds reviewed verbally with pt.    HPI:  Elizabeth [name] is a 75 year old woman with a history of CVA (on warfarin), end-stage renal disease secondary to diabetic nephropathy, s/p kidney transplant in December 2001 with normal creatinine, retinopathy, neuropathy, and nephropathy, CAD, status post 1-vessel bypass of her circumflex in 2001, chronic orthostatic hypotension, peripheral vascular disease status post left BKA in the setting of a Charcot joint, admitted to [facility] in October 2011 with severe PNA, peak Trop 0.19, Atrial fibrillation during her hospital course, started on amiodarone, With discontinuation of her amiodarone secondary to bradycardia and orthostatic hypotension. Florinef was started earlier in 2012 with improvement of her symptoms and the ability to work with physical therapy. history of aortic atherosclerosis, hyperlipidemia, labile blood pressures  She has vision problems, continues to have balance problems, and was told that she has vascular dementia. Previous diagnosis of yeast esophagitis, being treated with fluconazole. She presents today for follow-up of her orthostatic hypotension  On her last clinic visit, she stopped taking Florinef Weight had been fluctuating between 135 and 145 pounds, blood pressure was stable without symptoms of orthostasis Weight continues to trend up on its own, then husband provides torsemide several doses with improvement of her weight and fluid retention symptoms she does not stand on a regular basis, predominantly in her wheelchair Good appetite, overall stable Legs are weak,  balance issues secondary to vision problems from old stroke   On  previous office visit she mentioned having chronic nausea, occasional vomiting 1 or 2 times per week Was previously on Zofran, this was held by her husband.   EKG showing sinus bradycardia with rate 53 bpm, no significant ST or T-wave changes  Other past medical history previously on Florinef 0.1 mg daily. was taking midodrine in the past, not for quite some time  Blood pressure continues to run low in the morning. She has nocturia possibly leading to her low pressures. Blood pressure does seem to improve through the day and after getting dressed and bathed in the morning, she is sitting in a chair most of the day. She is currently not taking significant torsemide, weight stable  She reports having acute onset of vision loss bilaterally for Christmas 2015. This lasted for several hours She was seen by neurology, vascular surgery. Details unavailable though husband describes possible vertebral artery disease, though this was felt to perhaps be a chronic issue. She has continued on her warfarin with no further symptoms.  EKG on today's visit shows sinus bradycardia with rate 49 bpm, no significant ST or T-wave changes  Other past medical history early 2015, weight was up to 170 pounds, she had massive fluid retention, leg edema. She reported that Lasix was not helping her symptoms. previous Chest x-ray showed bilateral pleural effusions.  She was started on torsemide 40 mg daily and has had significant weight loss. Weight was stable at around 132 pounds and she was holding the torsemide   Previously tried on Florinef 0.2 mg daily with weight gain, high blood pressure  Recent lab work showing normal LFTs, hemoglobin A1c 6.1, total cholesterol 138, LDL 54, creatinine 1.01 with BUN 30  Previous Echo several years ago showed EF 50-55% with mild AI.  PMH:   has a past medical history of Coronary artery disease; Chronic kidney disease; Diabetes mellitus; PVD (peripheral vascular disease)  ([place]); History of orthostatic hypotension; Hyperlipidemia; Hypothyroidism; Anemia; Pancreatic cyst; Osteoporosis; Melanoma ([place]); Carotid bruit; Adenomatous polyp; GERD (gastroesophageal reflux disease); Gastritis; CVA (cerebral infarction); Bradycardia; Renal artery stenosis ([place]); Diabetic retinopathy; Renal osteodystrophy; Hyperparathyroidism; Leg ulcer ([place]); H/O immunosuppressive therapy; Renal disease; Fx wrist; Fx ankle; Neuropathy ([place]); Nephropathy; Carotid artery stenosis; Stroke [facility]); Tubular adenoma; Colitis; IBS (irritable bowel syndrome); Ischemic colitis ([place]); Arteriosclerosis, 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  PSH:    Past Surgical History  Procedure Laterality Date  . Kidney transplant  08/15/2000    [facility] in [place]  . 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 Appendectomy    . Cataract extraction    . Cholecystectomy    . Abdominal hysterectomy    . Thyroid surgery    . Tonsillectomy    . Cesarean section      x 2  . Vitrectomy      right eye  . Coronary artery bypass graft    . Colonoscopy w/ biopsies and polypectomy  02/18/2009    adenomatous polyps, diverticulosis, internal hemorrhoids  . Eus  02/04/2010    w/FNA, pancreatic cyst  . Upper gastrointestinal endoscopy  12/01/2009  . Colonoscopy  01/30/2012    Procedure: COLONOSCOPY;  Surgeon: Milus Banister, MD;  Location: [facility];  Service: Endoscopy;  Laterality: N/A;  . Esophagogastroduodenoscopy  05/02/2012    Procedure: ESOPHAGOGASTRODUODENOSCOPY (EGD);  Surgeon: Gatha Mayer, MD;  Location: [facility];  Service: Endoscopy;  Laterality: N/A;    Current  Outpatient Prescriptions  Medication Sig Dispense Refill  . atorvastatin (LIPITOR) 10 MG tablet Take 1 tablet (10 mg total) by mouth every other day. 30 tablet 1  . cholecalciferol (VITAMIN D) 1000 UNITS tablet Take 2,000 Units by mouth daily.     Marland Kitchen docusate sodium (COLACE) 100 MG capsule Take 100 mg by mouth daily as needed.     Marland Kitchen escitalopram (LEXAPRO) 10 MG tablet Take 10 mg by mouth daily.    . ferrous sulfate (SM IRON) 325 (65 FE) MG tablet Take 65 mg by mouth daily.     Marland Kitchen HYDROcodone-acetaminophen (NORCO/VICODIN) 5-325 MG per tablet Take 2 tablets by mouth 2 (two) times daily as needed for moderate pain.     . hyoscyamine (ANASPAZ) 0.125 MG TBDP disintergrating tablet Place 0.125 mg under the tongue as needed for cramping.     . insulin glargine (LANTUS) 100 UNIT/ML injection Inject 9 Units into the skin every morning.     . insulin lispro (HUMALOG) 100 UNIT/ML injection Inject 2-9 Units into the skin 4 (four) times daily -  before meals and at bedtime. Per sliding scale.    . levothyroxine (SYNTHROID, LEVOTHROID) 125 MCG tablet Take 125 mcg by mouth daily before breakfast.    . Magnesium 250 MG TABS Take 700 mg by mouth 2 (two) times daily.     . midodrine (PROAMATINE) 10 MG tablet Take 1 tablet (10 mg total) by mouth 3 (three) times daily as needed. 90 tablet 6  . mirtazapine (REMERON) 7.5 MG tablet  Take 7.5 mg by mouth at bedtime.     . Multiple Vitamin (MULTIVITAMIN WITH MINERALS) TABS Take 1 tablet by mouth daily.    . mycophenolate (MYFORTIC) 360 MG TBEC Take 360 mg by mouth 2 (two) times daily.     . nitroGLYCERIN (NITROSTAT) 0.4 MG SL tablet Place 1 tablet (0.4 mg total) under the tongue every 5 (five) minutes as needed for chest pain. 25 tablet 3  . ondansetron (ZOFRAN) 4 MG tablet Take 4 mg by mouth as needed.     . potassium chloride (K-DUR) 10 MEQ tablet Take 10 mEq by mouth daily at 12 noon.     . predniSONE (DELTASONE) 5 MG tablet Take 5 mg by mouth every morning.     .  promethazine (PHENERGAN) 12.5 MG tablet Take 12.5 mg by mouth as needed for nausea or vomiting.     . ranitidine (ZANTAC) 150 MG tablet Take 150 mg by mouth at bedtime.    . tacrolimus (PROGRAF) 0.5 MG capsule Take 1-2 mg by mouth 2 (two) times daily. Take 2mg  in the am & 1mg  at night    . torsemide (DEMADEX) 20 MG tablet Take 1 tablet (20 mg total) by mouth 2 (two) times daily as needed. 180 tablet 3  . warfarin (COUMADIN) 2.5 MG tablet TAKE AS DIRECTED BY COUMADIN CLINIC 200 tablet 3   No current facility-administered medications for this visit.     Allergies:   Erythromycin; Fentanyl; and Percocet   Social History:  The patient  reports that she has never smoked. She has never used smokeless tobacco. She reports that she does not drink alcohol or use illicit drugs.   Family History:   family history includes Heart attack (age of onset: 42) in her father; Stroke (age of onset: 70) in her mother. There is no history of Colon cancer or Diabetes.    Review of Systems: Review of Systems  Respiratory: Negative.   Cardiovascular: Negative.   Gastrointestinal: Negative.   Musculoskeletal: Negative.        Leg weakness  Neurological: Positive for weakness.  Psychiatric/Behavioral: Negative.   All other systems reviewed and are negative.    PHYSICAL EXAM: VS:  BP 116/68 mmHg  Pulse 53  Ht 5' 3.5" (1.613 m)  Wt 136 lb 4 oz (61.803 kg)  BMI 23.75 kg/m2 , BMI Body mass index is 23.75 kg/(m^2). GEN: Pale, thin , in no acute distress HEENT: normal Neck: no JVD, carotid bruits, or masses Cardiac: RRR; no murmurs, rubs, or gallops,no edema  Respiratory:  clear to auscultation bilaterally, normal work of breathing GI: soft, nontender, nondistended, + BS MS: Amputation left lower extremity  Skin: warm and dry, no rash Neuro:  Strength and sensation are intact Psych: euthymic mood, full affect    Recent Labs: No results found for requested labs within last 365 days.    Lipid  Panel Lab Results  Component Value Date   CHOL 115 08/14/2014   HDL 59 08/14/2014   LDLCALC 44 08/14/2014   TRIG 62 08/14/2014      Wt Readings from Last 3 Encounters:  02/05/16 136 lb 4 oz (61.803 kg)  07/01/15 138 lb (62.596 kg)  12/17/14 135 lb (61.236 kg)       ASSESSMENT AND PLAN:  Atrial fibrillation, unspecified type ([place]) - Plan: EKG 12-Lead Maintaining normal sinus rhythm. Asymptomatic bradycardia Tolerating warfarin  Hyperlipidemia Currently on Lipitor Cholesterol is at goal on the current lipid regimen. No changes to the medications were  made.  Acute diastolic CHF (congestive heart failure) (HCC) Well-controlled CHF symptoms, managed by husband who provides torsemide for weight gain  Orthostatic hypotension No longer taking midodrine or Florinef,  Blood pressure stable, predominantly in a seated position, asymptomatic  Cerebrovascular accident (CVA) due to embolism of precerebral artery (HCC) On warfarin, prior MRIs showing remote strokes possibly secondary to atrial fibrillation  SINUS BRADYCARDIA Asymptomatic, no further workup needed  Atherosclerosis of native coronary artery of native heart without angina pectoris Currently with no symptoms of angina. No further workup at this time. Continue current medication regimen.   Disposition:   F/U  6 months   Orders Placed This Encounter  Procedures  . EKG 12-Lead    Total encounter time more than 25 minutes  Greater than 50% was spent in counseling and coordination of care with the patient   Signed, Esmond Plants, M.D., Ph.D. 02/05/2016  [place], [employer] [facility]

## 2016-02-05 NOTE — Patient Instructions (Signed)
You are doing well. No medication changes were made.  Please call us if you have new issues that need to be addressed before your next appt.  Your physician wants you to follow-up in: 6 months.  You will receive a reminder letter in the mail two months in advance. If you don't receive a letter, please call our office to schedule the follow-up appointment.   

## 2016-02-29 ENCOUNTER — Other Ambulatory Visit: Payer: Self-pay | Admitting: Cardiovascular Disease

## 2016-03-01 NOTE — Telephone Encounter (Signed)
Review for refill, Thank you. 

## 2016-03-02 ENCOUNTER — Ambulatory Visit (INDEPENDENT_AMBULATORY_CARE_PROVIDER_SITE_OTHER): Payer: Medicare Other | Admitting: *Deleted

## 2016-03-02 DIAGNOSIS — Z5181 Encounter for therapeutic drug level monitoring: Secondary | ICD-10-CM | POA: Diagnosis not present

## 2016-03-02 DIAGNOSIS — I6310 Cerebral infarction due to embolism of unspecified precerebral artery: Secondary | ICD-10-CM

## 2016-03-02 DIAGNOSIS — I639 Cerebral infarction, unspecified: Secondary | ICD-10-CM

## 2016-03-02 DIAGNOSIS — I4891 Unspecified atrial fibrillation: Secondary | ICD-10-CM

## 2016-03-02 LAB — POCT INR: INR: 2.1

## 2016-04-13 ENCOUNTER — Ambulatory Visit (INDEPENDENT_AMBULATORY_CARE_PROVIDER_SITE_OTHER): Payer: Medicare Other

## 2016-04-13 DIAGNOSIS — Z5181 Encounter for therapeutic drug level monitoring: Secondary | ICD-10-CM

## 2016-04-13 DIAGNOSIS — I4891 Unspecified atrial fibrillation: Secondary | ICD-10-CM

## 2016-04-13 DIAGNOSIS — I639 Cerebral infarction, unspecified: Secondary | ICD-10-CM | POA: Diagnosis not present

## 2016-04-13 LAB — POCT INR: INR: 2.2

## 2016-04-14 ENCOUNTER — Other Ambulatory Visit: Payer: Self-pay | Admitting: Endocrinology

## 2016-04-14 DIAGNOSIS — E038 Other specified hypothyroidism: Secondary | ICD-10-CM

## 2016-04-20 ENCOUNTER — Ambulatory Visit
Admission: RE | Admit: 2016-04-20 | Discharge: 2016-04-20 | Disposition: A | Discharge status: Home / Self Care | Payer: Medicare Other | Source: Ambulatory Visit | Attending: Endocrinology | Admitting: Endocrinology

## 2016-04-20 DIAGNOSIS — E038 Other specified hypothyroidism: Secondary | ICD-10-CM

## 2016-05-23 ENCOUNTER — Other Ambulatory Visit: Payer: Self-pay | Admitting: Cardiovascular Disease

## 2016-05-25 ENCOUNTER — Ambulatory Visit (INDEPENDENT_AMBULATORY_CARE_PROVIDER_SITE_OTHER): Payer: Medicare Other

## 2016-05-25 DIAGNOSIS — Z23 Encounter for immunization: Secondary | ICD-10-CM

## 2016-05-25 DIAGNOSIS — I639 Cerebral infarction, unspecified: Secondary | ICD-10-CM

## 2016-05-25 DIAGNOSIS — Z5181 Encounter for therapeutic drug level monitoring: Secondary | ICD-10-CM

## 2016-05-25 DIAGNOSIS — I4891 Unspecified atrial fibrillation: Secondary | ICD-10-CM

## 2016-05-25 LAB — POCT INR: INR: 4.2

## 2016-06-08 ENCOUNTER — Ambulatory Visit (INDEPENDENT_AMBULATORY_CARE_PROVIDER_SITE_OTHER): Payer: Medicare Other | Admitting: *Deleted

## 2016-06-08 DIAGNOSIS — I4891 Unspecified atrial fibrillation: Secondary | ICD-10-CM

## 2016-06-08 DIAGNOSIS — I639 Cerebral infarction, unspecified: Secondary | ICD-10-CM | POA: Diagnosis not present

## 2016-06-08 DIAGNOSIS — Z5181 Encounter for therapeutic drug level monitoring: Secondary | ICD-10-CM

## 2016-06-08 LAB — POCT INR: INR: 3.1

## 2016-06-10 ENCOUNTER — Telehealth: Payer: Self-pay

## 2016-06-10 NOTE — Telephone Encounter (Signed)
Pt's husband called states pt started on Levaquin 750mg  QD x 10 days on 06/09/16.  Pt's INR on 06/08/16 was 3.1 we held pt x 1 dosage and then resumed previous Coumadin dosage regimen.  Pt took 5mg  yesterday, consulted with Eino Farber, PharmD advised to have pt take 2.5mg  QD except 5mg  on Sundays and Thursdays while on Levaquin until INR rechecked on Wednesday in [place] office. Made appt for 06/15/16.  Advised husband TCB with any further medication changes.  Pt's husband verbalized understanding.

## 2016-06-15 ENCOUNTER — Ambulatory Visit (INDEPENDENT_AMBULATORY_CARE_PROVIDER_SITE_OTHER): Payer: Medicare Other

## 2016-06-15 DIAGNOSIS — I639 Cerebral infarction, unspecified: Secondary | ICD-10-CM | POA: Diagnosis not present

## 2016-06-15 DIAGNOSIS — Z5181 Encounter for therapeutic drug level monitoring: Secondary | ICD-10-CM | POA: Diagnosis not present

## 2016-06-15 DIAGNOSIS — I4891 Unspecified atrial fibrillation: Secondary | ICD-10-CM | POA: Diagnosis not present

## 2016-06-15 LAB — POCT INR: INR: 1.6

## 2016-06-24 ENCOUNTER — Other Ambulatory Visit: Payer: Self-pay | Admitting: Student

## 2016-06-24 DIAGNOSIS — J90 Pleural effusion, not elsewhere classified: Secondary | ICD-10-CM

## 2016-06-29 ENCOUNTER — Ambulatory Visit (INDEPENDENT_AMBULATORY_CARE_PROVIDER_SITE_OTHER): Payer: Medicare Other

## 2016-06-29 DIAGNOSIS — Z5181 Encounter for therapeutic drug level monitoring: Secondary | ICD-10-CM | POA: Diagnosis not present

## 2016-06-29 DIAGNOSIS — I4891 Unspecified atrial fibrillation: Secondary | ICD-10-CM

## 2016-06-29 DIAGNOSIS — I639 Cerebral infarction, unspecified: Secondary | ICD-10-CM

## 2016-06-29 LAB — POCT INR: INR: 2.8

## 2016-07-07 ENCOUNTER — Ambulatory Visit
Admission: RE | Admit: 2016-07-07 | Discharge: 2016-07-07 | Disposition: A | Discharge status: Home / Self Care | Payer: Medicare Other | Source: Ambulatory Visit | Attending: Student | Admitting: Student

## 2016-07-07 DIAGNOSIS — I517 Cardiomegaly: Secondary | ICD-10-CM | POA: Diagnosis not present

## 2016-07-07 DIAGNOSIS — Z940 Kidney transplant status: Secondary | ICD-10-CM | POA: Insufficient documentation

## 2016-07-07 DIAGNOSIS — J90 Pleural effusion, not elsewhere classified: Secondary | ICD-10-CM | POA: Insufficient documentation

## 2016-07-07 DIAGNOSIS — I700 Atherosclerosis of aorta: Secondary | ICD-10-CM | POA: Diagnosis not present

## 2016-07-07 DIAGNOSIS — I2510 Atherosclerotic heart disease of native coronary artery without angina pectoris: Secondary | ICD-10-CM | POA: Diagnosis not present

## 2016-07-20 ENCOUNTER — Ambulatory Visit (INDEPENDENT_AMBULATORY_CARE_PROVIDER_SITE_OTHER): Payer: Medicare Other

## 2016-07-20 DIAGNOSIS — Z5181 Encounter for therapeutic drug level monitoring: Secondary | ICD-10-CM | POA: Diagnosis not present

## 2016-07-20 DIAGNOSIS — I4891 Unspecified atrial fibrillation: Secondary | ICD-10-CM | POA: Diagnosis not present

## 2016-07-20 DIAGNOSIS — I639 Cerebral infarction, unspecified: Secondary | ICD-10-CM

## 2016-07-20 LAB — POCT INR: INR: 2.8

## 2016-07-21 ENCOUNTER — Other Ambulatory Visit: Payer: Self-pay | Admitting: Specialist

## 2016-07-21 DIAGNOSIS — J90 Pleural effusion, not elsewhere classified: Secondary | ICD-10-CM

## 2016-07-26 ENCOUNTER — Ambulatory Visit: Payer: Medicare Other

## 2016-07-27 ENCOUNTER — Ambulatory Visit
Admission: RE | Admit: 2016-07-27 | Discharge: 2016-07-27 | Disposition: A | Discharge status: Home / Self Care | Payer: Medicare Other | Source: Ambulatory Visit | Attending: Specialist | Admitting: Specialist

## 2016-07-27 ENCOUNTER — Ambulatory Visit
Admission: RE | Admit: 2016-07-27 | Discharge: 2016-07-27 | Disposition: A | Discharge status: Home / Self Care | Payer: Medicare Other | Source: Ambulatory Visit | Attending: Diagnostic Radiology | Admitting: Diagnostic Radiology

## 2016-07-27 DIAGNOSIS — J90 Pleural effusion, not elsewhere classified: Secondary | ICD-10-CM | POA: Insufficient documentation

## 2016-07-27 DIAGNOSIS — Z9889 Other specified postprocedural states: Secondary | ICD-10-CM | POA: Diagnosis present

## 2016-07-27 LAB — BODY FLUID CELL COUNT WITH DIFFERENTIAL
LYMPHS FL: 43 %
Monocyte-Macrophage-Serous Fluid: 7 %
NEUTROPHIL FLUID: 50 %
Total Nucleated Cell Count, Fluid: 21 cu mm

## 2016-07-27 LAB — PROTEIN, BODY FLUID

## 2016-07-27 LAB — LACTATE DEHYDROGENASE, PLEURAL OR PERITONEAL FLUID: LD, Fluid: 62 U/L — ABNORMAL HIGH (ref 3–23)

## 2016-07-27 NOTE — Procedures (Signed)
Under US guidance, thoracentesis of right pleural effusion was performed. CXR to follow.

## 2016-07-28 LAB — MISC LABCORP TEST (SEND OUT): Labcorp test code: 19588

## 2016-07-29 LAB — CYTOLOGY - NON PAP

## 2016-07-31 LAB — BODY FLUID CULTURE: Culture: NO GROWTH

## 2016-08-02 LAB — ACID FAST SMEAR (AFB): ACID FAST SMEAR - AFSCU2: NEGATIVE

## 2016-08-24 ENCOUNTER — Ambulatory Visit (INDEPENDENT_AMBULATORY_CARE_PROVIDER_SITE_OTHER): Payer: Medicare Other | Admitting: *Deleted

## 2016-08-24 DIAGNOSIS — I639 Cerebral infarction, unspecified: Secondary | ICD-10-CM

## 2016-08-24 DIAGNOSIS — Z5181 Encounter for therapeutic drug level monitoring: Secondary | ICD-10-CM

## 2016-08-24 DIAGNOSIS — I4891 Unspecified atrial fibrillation: Secondary | ICD-10-CM

## 2016-08-24 LAB — POCT INR: INR: 3.5

## 2016-08-25 ENCOUNTER — Other Ambulatory Visit: Payer: Self-pay | Admitting: Specialist

## 2016-08-25 DIAGNOSIS — J90 Pleural effusion, not elsewhere classified: Secondary | ICD-10-CM

## 2016-08-29 ENCOUNTER — Ambulatory Visit
Admission: RE | Admit: 2016-08-29 | Discharge: 2016-08-29 | Disposition: A | Discharge status: Home / Self Care | Payer: Medicare Other | Source: Ambulatory Visit | Attending: Specialist | Admitting: Specialist

## 2016-08-29 ENCOUNTER — Ambulatory Visit
Admission: RE | Admit: 2016-08-29 | Discharge: 2016-08-29 | Disposition: A | Discharge status: Home / Self Care | Payer: Medicare Other | Source: Ambulatory Visit | Attending: Interventional Radiology | Admitting: Interventional Radiology

## 2016-08-29 DIAGNOSIS — J90 Pleural effusion, not elsewhere classified: Secondary | ICD-10-CM | POA: Diagnosis not present

## 2016-08-29 LAB — BODY FLUID CELL COUNT WITH DIFFERENTIAL
Eos, Fluid: 0 %
LYMPHS FL: 27 %
MONOCYTE-MACROPHAGE-SEROUS FLUID: 54 %
Neutrophil Count, Fluid: 19 %
Other Cells, Fluid: 0 %
WBC FLUID: 154 uL

## 2016-08-29 LAB — LACTATE DEHYDROGENASE, PLEURAL OR PERITONEAL FLUID: LD, Fluid: 59 U/L — ABNORMAL HIGH (ref 3–23)

## 2016-08-29 LAB — GLUCOSE, SEROUS FLUID: GLUCOSE FL: 138 mg/dL

## 2016-08-29 LAB — PROTEIN, BODY FLUID

## 2016-08-30 LAB — MISC LABCORP TEST (SEND OUT): LABCORP TEST CODE: 19588

## 2016-08-30 LAB — FUNGUS CULTURE WITH STAIN

## 2016-08-30 LAB — FUNGUS CULTURE RESULT

## 2016-08-30 LAB — AMYLASE, BODY FLUID: Amylase, Fluid: 37 U/L

## 2016-08-30 LAB — FUNGAL ORGANISM REFLEX

## 2016-08-31 LAB — CYTOLOGY - NON PAP

## 2016-09-01 LAB — BODY FLUID CULTURE: Culture: NO GROWTH

## 2016-09-06 ENCOUNTER — Telehealth: Payer: Self-pay

## 2016-09-06 NOTE — Telephone Encounter (Signed)
These results belong to Dr. Wallene Huh with [facility]. I have no way of sending these to him.

## 2016-09-06 NOTE — Telephone Encounter (Signed)
-----   Message from Jonathon Bellows, MD sent at 09/05/2016  9:10 AM EST ----- I did not order these labs can we send it to the appropriate physician ?

## 2016-09-14 ENCOUNTER — Ambulatory Visit (INDEPENDENT_AMBULATORY_CARE_PROVIDER_SITE_OTHER): Payer: Medicare Other

## 2016-09-14 DIAGNOSIS — I639 Cerebral infarction, unspecified: Secondary | ICD-10-CM | POA: Diagnosis not present

## 2016-09-14 DIAGNOSIS — Z5181 Encounter for therapeutic drug level monitoring: Secondary | ICD-10-CM | POA: Diagnosis not present

## 2016-09-14 DIAGNOSIS — I4891 Unspecified atrial fibrillation: Secondary | ICD-10-CM | POA: Diagnosis not present

## 2016-09-14 LAB — POCT INR: INR: 3.6

## 2016-09-22 ENCOUNTER — Other Ambulatory Visit: Payer: Self-pay | Admitting: Specialist

## 2016-09-22 ENCOUNTER — Telehealth: Payer: Self-pay | Admitting: Cardiovascular Disease

## 2016-09-22 DIAGNOSIS — J90 Pleural effusion, not elsewhere classified: Secondary | ICD-10-CM

## 2016-09-22 NOTE — Telephone Encounter (Signed)
Error

## 2016-09-23 ENCOUNTER — Ambulatory Visit
Admission: RE | Admit: 2016-09-23 | Discharge: 2016-09-23 | Disposition: A | Discharge status: Home / Self Care | Payer: Medicare Other | Source: Ambulatory Visit | Attending: Diagnostic Radiology | Admitting: Diagnostic Radiology

## 2016-09-23 ENCOUNTER — Ambulatory Visit
Admission: RE | Admit: 2016-09-23 | Discharge: 2016-09-23 | Disposition: A | Discharge status: Home / Self Care | Payer: Medicare Other | Source: Ambulatory Visit | Attending: Specialist | Admitting: Specialist

## 2016-09-23 DIAGNOSIS — J90 Pleural effusion, not elsewhere classified: Secondary | ICD-10-CM | POA: Diagnosis present

## 2016-09-23 DIAGNOSIS — Z9889 Other specified postprocedural states: Secondary | ICD-10-CM | POA: Insufficient documentation

## 2016-09-23 LAB — LACTATE DEHYDROGENASE, PLEURAL OR PERITONEAL FLUID: LD, Fluid: 51 U/L — ABNORMAL HIGH (ref 3–23)

## 2016-09-23 LAB — BODY FLUID CELL COUNT WITH DIFFERENTIAL
Eos, Fluid: 0 %
Lymphs, Fluid: 14 %
Monocyte-Macrophage-Serous Fluid: 47 %
Neutrophil Count, Fluid: 39 %
Other Cells, Fluid: 0 %
WBC FLUID: 18 uL

## 2016-09-23 LAB — GLUCOSE, SEROUS FLUID: GLUCOSE FL: 144 mg/dL

## 2016-09-23 LAB — PROTEIN, BODY FLUID: Total protein, fluid: <3 g/dL

## 2016-09-23 NOTE — Procedures (Signed)
US guided right thoracentesis.  Removed 1.2 liters of yellow fluid.  Fluid sent to lab.  CXR pending.

## 2016-09-24 LAB — MISC LABCORP TEST (SEND OUT): Labcorp test code: 19588

## 2016-09-27 LAB — BODY FLUID CULTURE: Culture: NO GROWTH

## 2016-09-27 LAB — ACID FAST SMEAR (AFB)

## 2016-09-27 LAB — ACID FAST SMEAR (AFB, MYCOBACTERIA): Acid Fast Smear: NEGATIVE

## 2016-09-27 LAB — CYTOLOGY - NON PAP

## 2016-09-28 ENCOUNTER — Ambulatory Visit (INDEPENDENT_AMBULATORY_CARE_PROVIDER_SITE_OTHER): Payer: Medicare Other

## 2016-09-28 DIAGNOSIS — I639 Cerebral infarction, unspecified: Secondary | ICD-10-CM | POA: Diagnosis not present

## 2016-09-28 DIAGNOSIS — I4891 Unspecified atrial fibrillation: Secondary | ICD-10-CM | POA: Diagnosis not present

## 2016-09-28 DIAGNOSIS — Z5181 Encounter for therapeutic drug level monitoring: Secondary | ICD-10-CM

## 2016-09-28 LAB — POCT INR: INR: 2.6

## 2016-09-29 LAB — FUNGUS CULTURE RESULT

## 2016-09-29 LAB — FUNGUS CULTURE WITH STAIN

## 2016-09-29 LAB — FUNGAL ORGANISM REFLEX

## 2016-09-30 ENCOUNTER — Encounter: Payer: Self-pay | Admitting: Cardiovascular Disease

## 2016-09-30 ENCOUNTER — Ambulatory Visit (INDEPENDENT_AMBULATORY_CARE_PROVIDER_SITE_OTHER): Payer: Medicare Other | Admitting: Cardiovascular Disease

## 2016-09-30 VITALS — BP 91/66 | HR 48 | Ht 63.0 in | Wt 134.5 lb

## 2016-09-30 DIAGNOSIS — N189 Chronic kidney disease, unspecified: Secondary | ICD-10-CM | POA: Diagnosis not present

## 2016-09-30 DIAGNOSIS — I10 Essential (primary) hypertension: Secondary | ICD-10-CM

## 2016-09-30 DIAGNOSIS — E440 Moderate protein-calorie malnutrition: Secondary | ICD-10-CM | POA: Diagnosis not present

## 2016-09-30 DIAGNOSIS — I4891 Unspecified atrial fibrillation: Secondary | ICD-10-CM

## 2016-09-30 DIAGNOSIS — I509 Heart failure, unspecified: Secondary | ICD-10-CM

## 2016-09-30 DIAGNOSIS — I5031 Acute diastolic (congestive) heart failure: Secondary | ICD-10-CM | POA: Diagnosis not present

## 2016-09-30 DIAGNOSIS — I639 Cerebral infarction, unspecified: Secondary | ICD-10-CM

## 2016-09-30 DIAGNOSIS — D631 Anemia in chronic kidney disease: Secondary | ICD-10-CM | POA: Diagnosis not present

## 2016-09-30 DIAGNOSIS — J90 Pleural effusion, not elsewhere classified: Secondary | ICD-10-CM | POA: Insufficient documentation

## 2016-09-30 DIAGNOSIS — Z940 Kidney transplant status: Secondary | ICD-10-CM | POA: Diagnosis not present

## 2016-09-30 DIAGNOSIS — I2510 Atherosclerotic heart disease of native coronary artery without angina pectoris: Secondary | ICD-10-CM

## 2016-09-30 NOTE — Progress Notes (Signed)
Cardiology Office Note  Date:  09/30/2016   ID:  DAISY LARUSSO, DOB 1941/03/20, MRN [id number]  PCP:  Donetta Potts, MD   Chief Complaint  Patient presents with  . other    24mo f/u. Pt c/o sob at times. Recent THORACENTESIS.  Reviewed meds with pt verbally.    HPI:  Mrs. Kien is a 76 year old woman with a history of CVA (on warfarin), end-stage renal disease secondary to diabetic nephropathy, s/p kidney transplant in December 2001 with normal creatinine, retinopathy, neuropathy, and nephropathy, CAD, status post 1-vessel bypass of her circumflex in 2001, chronic orthostatic hypotension, peripheral vascular disease status post left BKA in the setting of a Charcot joint, admitted to [facility] in October 2011 with severe PNA, peak Trop 0.19, Atrial fibrillation during her hospital course, started on amiodarone, With discontinuation of her amiodarone secondary to bradycardia and orthostatic hypotension. Florinef was started earlier in 2012 with improvement of her symptoms and the ability to work with physical therapy (Later stopped secondary to leg swelling, hypertension) history of aortic atherosclerosis, hyperlipidemia, labile blood pressures   vision problems, continues to have balance problems, and was told that she has vascular dementia. Previous diagnosis of yeast esophagitis, being treated with fluconazole. She presents today for follow-up of her orthostatic hypotension, and pleural effusion  She presents today with her husband and daughter She reports that over the past several months she has had recurrent right pleural effusion, minimal on the left. She has been under the care of Dr. Raul Del She has had thoracentesis x 3,  Very sedentary at baseline Recurrent effusion presents with cough, mild shortness of breath Documented on chest x-ray, CT scan Results reviewed with her in detail She was previously taking torsemide 10 mg twice a day, now taking 20 mg at night  time There has been slight improvement in weight down to135 to 136 pounds She's not eating well, chronic anemia, low protein  Recent CXR 09/23/2016 was clear, on right after thoracentesis  Echocardiogram ordered by Dr. Raul Del, read by Dr. Sabra Heck at [place] not available for review. Details discussed with patient and family Normal ejection fraction greater than 55%, moderately elevated right heart pressures 55 mmHg Note indicates moderate mitral valve stenosis, moderate MR Prior echocardiogram 2015 showed normal right heart pressures  EKG on today's visit shows sinus bradycardia rate 48 bpm, no significant ST or T-wave changes  Other past medical history reviewed On April the office visit she stopped taking Florinef Weight had been fluctuating between 135 and 145 pounds, blood pressure was stable without symptoms of orthostasis Previously, husband provides torsemide  with improvement of her weight and fluid retention  Predominantly in a wheelchair Good appetite, overall stable Legs are weak,  balance issues secondary to vision problems from old stroke   Previously reported having chronic nausea, occasional vomiting 1 or 2 times per week Was previously on Zofran, this was held by her husband.   Other past medical history previously on Florinef 0.1 mg daily. was taking midodrine in the past, not for quite some time  Blood pressure continues to run low in the morning. She has nocturia possibly leading to her low pressures. Blood pressure does seem to improve through the day and after getting dressed and bathed in the morning, she is sitting in a chair most of the day. She is currently not taking significant torsemide, weight stable  She reports having acute onset of vision loss bilaterally for Christmas 2015. This lasted for several hours She was  seen by neurology, vascular surgery. Details unavailable though husband describes possible vertebral artery disease, though this  was felt to perhaps be a chronic issue. She has continued on her warfarin with no further symptoms.  EKG on today's visit shows sinus bradycardia with rate 49 bpm, no significant ST or T-wave changes  Other past medical history early 2015, weight was up to 170 pounds, she had massive fluid retention, leg edema. She reported that Lasix was not helping her symptoms. previous Chest x-ray showed bilateral pleural effusions.  She was started on torsemide 40 mg daily and has had significant weight loss. Weight was stable at around 132 pounds and she was holding the torsemide   Previously tried on Florinef 0.2 mg daily with weight gain, high blood pressure  Recent lab work showing normal LFTs, hemoglobin A1c 6.1, total cholesterol 138, LDL 54, creatinine 1.01 with BUN 30  Previous Echo several years ago showed EF 50-55% with mild AI.  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); Bradycardia; Carotid artery stenosis; Carotid bruit; Chronic kidney disease; Colitis; Compression fracture of L4 lumbar vertebra ([place]); Coronary artery disease; CVA (cerebral infarction); Diabetes mellitus; Diabetic retinopathy; Esophageal candidiasis ([place]); Fracture of right maxilla (09/21/2012); Fx ankle; Fx wrist; Gastritis; GERD (gastroesophageal reflux disease); H/O immunosuppressive therapy; History of orthostatic hypotension; Hyperlipidemia; Hyperparathyroidism; Hypothyroidism; IBS (irritable bowel syndrome); Ischemic colitis ([place]); Lactose intolerance; Leg ulcer ([place]); Melanoma ([place]); Nephropathy; 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); Renal disease; Renal osteodystrophy; Right orbit fracture ([place]) (09/21/2012); Stroke [facility]); TBI (traumatic brain injury) ([place]) (09/30/2012); Tubular adenoma; and Zygoma  fracture ([place]) (09/21/2012).  PSH:    Past Surgical History:  Procedure Laterality Date  . ABDOMINAL HYSTERECTOMY    . APPENDECTOMY    . BELOW KNEE LEG AMPUTATION     left, charcott joint from neuropathy  . CATARACT EXTRACTION    . CESAREAN SECTION     x 2  . CHOLECYSTECTOMY    . COLONOSCOPY  01/30/2012   Procedure: COLONOSCOPY;  Surgeon: Milus Banister, MD;  Location: [facility];  Service: Endoscopy;  Laterality: N/A;  . COLONOSCOPY W/ BIOPSIES AND POLYPECTOMY  02/18/2009   adenomatous polyps, diverticulosis, internal hemorrhoids  . CORONARY ARTERY BYPASS GRAFT    . ESOPHAGOGASTRODUODENOSCOPY  05/02/2012   Procedure: ESOPHAGOGASTRODUODENOSCOPY (EGD);  Surgeon: Gatha Mayer, MD;  Location: [facility];  Service: Endoscopy;  Laterality: N/A;  . EUS  02/04/2010   w/FNA, pancreatic cyst  . KIDNEY TRANSPLANT  08/15/2000   [facility] in [place]  . THORACENTESIS  07/2016, 08/2016, 09/2016   Ordered by Dr. Vella Kohler. Surg: Dr.Green, Dr. Anselm Pancoast  . THYROID SURGERY    . TONSILLECTOMY    . UPPER GASTROINTESTINAL ENDOSCOPY  12/01/2009  . VITRECTOMY     right eye    Current Outpatient Prescriptions  Medication Sig Dispense Refill  . acetaminophen (TYLENOL) 500 MG tablet Take 500 mg by mouth every 6 (six) hours as needed.    Marland Kitchen atorvastatin (LIPITOR) 10 MG tablet Take 1 tablet (10 mg total) by mouth every other day. 30 tablet 1  . cholecalciferol (VITAMIN D) 1000 UNITS tablet Take 2,000 Units by mouth daily.     Marland Kitchen docusate sodium (COLACE) 100 MG capsule Take 100 mg by mouth daily as needed.     Marland Kitchen escitalopram (LEXAPRO) 10 MG tablet Take 10 mg by mouth daily.    Marland Kitchen  ferrous sulfate (SM IRON) 325 (65 FE) MG tablet Take 65 mg by mouth daily.     Marland Kitchen HYDROcodone-acetaminophen (NORCO/VICODIN) 5-325 MG per tablet Take 2 tablets by mouth 2 (two) times daily as needed for moderate pain.     . hyoscyamine (ANASPAZ) 0.125 MG TBDP disintergrating tablet Place 0.125 mg under the tongue as needed for  cramping.     . insulin glargine (LANTUS) 100 UNIT/ML injection Inject 9 Units into the skin every morning.     . insulin lispro (HUMALOG) 100 UNIT/ML injection Inject 2-9 Units into the skin 4 (four) times daily -  before meals and at bedtime. Per sliding scale.    . levothyroxine (SYNTHROID, LEVOTHROID) 125 MCG tablet Take 125 mcg by mouth daily before breakfast.    . magnesium gluconate (MAGONATE) 500 MG tablet Take 500 mg by mouth 2 (two) times daily.    . midodrine (PROAMATINE) 10 MG tablet Take 1 tablet (10 mg total) by mouth 3 (three) times daily as needed. 90 tablet 6  . mirtazapine (REMERON) 7.5 MG tablet Take 7.5 mg by mouth at bedtime.     . Multiple Vitamin (MULTIVITAMIN WITH MINERALS) TABS Take 1 tablet by mouth daily.    . mycophenolate (MYFORTIC) 360 MG TBEC Take 360 mg by mouth 2 (two) times daily.     . nitroGLYCERIN (NITROSTAT) 0.4 MG SL tablet Place 1 tablet (0.4 mg total) under the tongue every 5 (five) minutes as needed for chest pain. 25 tablet 3  . ondansetron (ZOFRAN) 4 MG tablet Take 4 mg by mouth as needed.     . potassium chloride (K-DUR) 10 MEQ tablet Take 10 mEq by mouth daily at 12 noon.     . predniSONE (DELTASONE) 5 MG tablet Take 5 mg by mouth every morning.     . ranitidine (ZANTAC) 150 MG tablet Take 150 mg by mouth at bedtime.    . tacrolimus (PROGRAF) 0.5 MG capsule Take 1-2 mg by mouth 2 (two) times daily. Take 2mg  in the am & 1mg  at night    . torsemide (DEMADEX) 20 MG tablet TAKE ONE TABLET BY MOUTH TWICE DAILY AS NEEDED 180 tablet 2  . warfarin (COUMADIN) 2.5 MG tablet TAKE AS DIRECTED BY  COUMADIN CLINIC (Patient taking differently: TAKE AS DIRECTED BY  COUMADIN CLINIC. Take 2.5mg  Monday, Wednesday, Friday, Saturday.) 150 tablet 1  . warfarin (COUMADIN) 5 MG tablet Take 5 mg by mouth daily.     No current facility-administered medications for this visit.      Allergies:   Azithromycin; Erythromycin; Fentanyl; and Percocet [oxycodone-acetaminophen]    Social History:  The patient  reports that she has never smoked. She has never used smokeless tobacco. She reports that she does not drink alcohol or use drugs.   Family History:   family history includes Heart attack (age of onset: 53) in her father; Stroke (age of onset: 67) in her mother.    Review of Systems: Review of Systems  Respiratory: Positive for cough and shortness of breath.   Cardiovascular: Negative.   Gastrointestinal: Negative.   Musculoskeletal: Negative.        Leg weakness  Neurological: Positive for weakness.  Psychiatric/Behavioral: Negative.   All other systems reviewed and are negative.    PHYSICAL EXAM: VS:  BP 91/66 (BP Location: Right Arm, Patient Position: Sitting, Cuff Size: Normal)   Pulse (!) 48   Ht 5\' 3"  (1.6 m)   Wt 134 lb 8 oz (61 kg)  BMI 23.83 kg/m  , BMI Body mass index is 23.83 kg/m. GEN: Well nourished, well developed, in no acute distress , presenting in wheelchair HEENT: normal  Neck: no JVD, carotid bruits, or masses Cardiac: RRR; no murmurs, rubs, or gallops,no edema  Respiratory:  Dull to percussion right base up 6 cm,  left clear to the base normal work of breathing GI: soft, nontender, nondistended, + BS MS: no deformity or atrophy , leg prosthesis on the left Skin: warm and dry, no rash Neuro:  Strength and sensation are intact Psych: euthymic mood, full affect    Recent Labs: No results found for requested labs within last 8760 hours.    Lipid Panel Lab Results  Component Value Date   CHOL 115 08/14/2014   HDL 59 08/14/2014   LDLCALC 44 08/14/2014   TRIG 62 08/14/2014      Wt Readings from Last 3 Encounters:  09/30/16 134 lb 8 oz (61 kg)  02/05/16 136 lb 4 oz (61.8 kg)  07/01/15 138 lb (62.6 kg)       ASSESSMENT AND PLAN:  HYPERTENSION, BENIGN - Plan: EKG 12-Lead, B Nat Peptide Blood pressure stable on midodrine, hypotensive today Poor candidate for fludrocortisone given fluid  retention  Atherosclerosis of native coronary artery of native heart without angina pectoris - Plan: EKG 12-Lead, B Nat Peptide  Atrial fibrillation, unspecified type ([place]) - Plan: EKG 12-Lead, B Nat Peptide Sinus bradycardia noted on today's visit Currently not on any rate controlling agents  Acute diastolic CHF (congestive heart failure) (HCC) - Plan: B Nat Peptide, B Nat Peptide She has acute on chronic diastolic CHF, moderately elevated right heart pressures on echocardiogram done outside our facility. Possibly exacerbated by moderate mitral valve stenosis, images not available. Also with moderate MR Management as below with increased torsemide use Suspect goal weight will be 132 pounds, currently 135-136 pounds We will order BNP for next week  Pleural effusion due to congestive heart failure (HCC) Long history concerning management of her pleural effusion She has had thoracentesis 3 with recurrence of the fluid Clinical exam with recurrent right pleural effusion on today's visit, moderate in size Echocardiogram with moderately elevated right heart pressures Normal renal function November 2017 Minimal improvement taking torsemide 20 mg every evening Poor candidate for pleurodesis given her fragile health, anorexia, high risk of complication Best option would be to advance her torsemide up to 20 mg twice a day alternating with 20 mg daily. Close monitoring of renal function, goal creatinine 1.3 up to 1.4 If no improvement in her pleural effusion and stable renal function, could potentially increase torsemide up to 20 mgrams twice a day Long discussion with patient, family about the above  Anemia in chronic kidney disease, unspecified CKD stage Anemia likely contributing to recurrent pleural effusion. Previous hemoglobin 10.9  Moderate protein-calorie malnutrition (HCC) Worsening anorexia in the past month or so, likely contributing to her recurrent pleural effusion. Long discussion  with patient and family, stressed importance of eating, not missing meals.  H/O kidney transplant Followed by nephrology   Total encounter time more than 45 minutes  Greater than 50% was spent in counseling and coordination of care with the patient   Disposition:   F/U  1 months   Orders Placed This Encounter  Procedures  . B Nat Peptide  . B Nat Peptide  . EKG 12-Lead     Signed, Esmond Plants, M.D., Ph.D. 09/30/2016  [place], [place]

## 2016-09-30 NOTE — Patient Instructions (Addendum)
Medication Instructions:   Please consider increasing the torsemide up to 2 a day alternating with one a day  Labwork:  We will order a BNP (order to go)  Testing/Procedures:  No further testing at this time   I recommend watching educational videos on topics of interest to you at:       [email or web address]  Enter code: HEARTCARE    Follow-Up: It was a pleasure seeing you in the office today. Please call us if you have new issues that need to be addressed before your next appt.  [phone]  Your physician wants you to follow-up in: 1 month.    If you need a refill on your cardiac medications before your next appointment, please call your pharmacy.

## 2016-10-05 ENCOUNTER — Telehealth: Payer: Self-pay | Admitting: Cardiovascular Disease

## 2016-10-05 ENCOUNTER — Other Ambulatory Visit: Payer: Self-pay | Admitting: Cardiovascular Disease

## 2016-10-05 NOTE — Telephone Encounter (Signed)
BNP order routed to fax # provided.

## 2016-10-05 NOTE — Telephone Encounter (Signed)
Pt spouse called, states the order pt was given to get labs at [facility] was not signed. However, the did go ahead with the labs.please fax order to [facility]  [phone].

## 2016-10-07 LAB — BRAIN NATRIURETIC PEPTIDE: BNP: 393.6 pg/mL — AB (ref 0.0–100.0)

## 2016-10-19 ENCOUNTER — Ambulatory Visit (INDEPENDENT_AMBULATORY_CARE_PROVIDER_SITE_OTHER): Payer: Medicare Other

## 2016-10-19 DIAGNOSIS — I639 Cerebral infarction, unspecified: Secondary | ICD-10-CM

## 2016-10-19 DIAGNOSIS — Z5181 Encounter for therapeutic drug level monitoring: Secondary | ICD-10-CM

## 2016-10-19 DIAGNOSIS — I4891 Unspecified atrial fibrillation: Secondary | ICD-10-CM

## 2016-10-19 LAB — POCT INR: INR: 2

## 2016-10-25 LAB — FUNGUS CULTURE WITH STAIN

## 2016-10-25 LAB — FUNGAL ORGANISM REFLEX

## 2016-10-25 LAB — FUNGUS CULTURE RESULT

## 2016-10-26 LAB — ACID FAST CULTURE WITH REFLEXED SENSITIVITIES (MYCOBACTERIA): Acid Fast Culture: NEGATIVE

## 2016-11-01 NOTE — Progress Notes (Signed)
Cardiology Office Note  Date:  11/02/2016   ID:  Elizabeth [name], DOB Aug 04, 1941, MRN [id number]  PCP:  Elizabeth Potts, MD   Chief Complaint  Patient presents with  . other    1 month follow up and discuss recent Echo from [facility]. Meds reviewed by the pt.'s husband verbally.     HPI:  Elizabeth [name] is a 76 year old woman with a history of CVA (on warfarin), end-stage renal disease secondary to diabetic nephropathy, s/p kidney transplant in December 2001 with normal creatinine, retinopathy, neuropathy, and nephropathy, CAD, status post 1-vessel bypass of her circumflex in 2001, chronic orthostatic hypotension, peripheral vascular disease status post left BKA in the setting of a Charcot joint, admitted to [facility] in October 2011 with severe PNA, peak Trop 0.19, Atrial fibrillation during her hospital course, started on amiodarone, With discontinuation of her amiodarone secondary to bradycardia and orthostatic hypotension. Florinef was started earlier in 2012 with improvement of her symptoms and the ability to work with physical therapy (Later stopped secondary to leg swelling, hypertension) history of aortic atherosclerosis, hyperlipidemia, labile blood pressures   vision problems, continues to have balance problems, and was told that she has vascular dementia. Previous diagnosis of yeast esophagitis, being treated with fluconazole. She presents today for follow-up of her orthostatic hypotension, and pleural effusion  Recent echocardiogram performed at [facility]02/04/2017 Results discussed with patient and husband on today's visit Performed by Elizabeth [name] Normal LV function, normal RV function, moderate MR, moderate MS per the notes (though mean gradient 2.6 mmHg) RVSP 55 No significant change compared to prior echocardiogram April 2015  Since his last clinic visit one month ago he has been Alternating torsemide 20 mg and 10 mg Down 3 pounds overall down to 131 pounds nausea  and vomiting past few days, additional pound loss from 132 down to 131 Etiology of her symptoms is unclear, he was concerned about CHF exacerbation as a cause of nausea vomiting  He has been listening to her lungs with stethoscope, feels they are stable or better Seen by Elizabeth [name], again pleurodesis was discussed She has had thoracentesis x 3, none in the past month No recent chest x-ray  Very sedentary at baseline, unable to ambulate Husband denies that she has shortness of breath in the past month  She's not eating well, chronic anemia, low protein  Lab work provided by patient's husband reviewed in detail today in the office, creatinine 1.31, BUN 37, hematocrit 39, A1c 6.0  EKG on today's visit shows sinus bradycardia rate 52 bpm, no significant ST or T-wave changes  Other past medical history reviewed On April the office visit she stopped taking Florinef Weight had been fluctuating between 135 and 145 pounds, blood pressure was stable without symptoms of orthostasis Previously, husband provides torsemide  with improvement of her weight and fluid retention  Predominantly in a wheelchair Good appetite, overall stable Legs are weak, balance issues secondary to vision problems from old stroke   Previously reported having chronic nausea, occasional vomiting 1 or 2 times per week Was previously on Zofran, this was held by her husband.   Other past medical history previously on Florinef 0.1 mg daily. was taking midodrine in the past, not for quite some time  Blood pressure continues to run low in the morning. She has nocturia possibly leading to her low pressures. Blood pressure does seem to improve through the day and after getting dressed and bathed in the morning, she is sitting in a chair most  of the day. She is currently not taking significant torsemide, weight stable  She reports having acute onset of vision loss bilaterally for Christmas 2015. This lasted for several  hours She was seen by neurology, vascular surgery. Details unavailable though husband describes possible vertebral artery disease, though this was felt to perhaps be a chronic issue. She has continued on her warfarin with no further symptoms.  EKG on today's visit shows sinus bradycardia with rate 49 bpm, no significant ST or T-wave changes  Other past medical history early 2015, weight was up to 170 pounds, she had massive fluid retention, leg edema. She reported that Lasix was not helping her symptoms. previous Chest x-ray showed bilateral pleural effusions.  She was started on torsemide 40 mg daily and has had significant weight loss. Weight was stable at around 132 pounds and she was holding the torsemide   Previously tried on Florinef 0.2 mg daily with weight gain, high blood pressure  Recent lab work showing normal LFTs, hemoglobin A1c 6.1, total cholesterol 138, LDL 54, creatinine 1.01 with BUN 30  Previous Echo several years ago showed EF 50-55% with mild AI.  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); Bradycardia; Carotid artery stenosis; Carotid bruit; Chronic kidney disease; Colitis; Compression fracture of L4 lumbar vertebra ([place]); Coronary artery disease; CVA (cerebral infarction); Diabetes mellitus; Diabetic retinopathy; Esophageal candidiasis ([place]); Fracture of right maxilla (09/21/2012); Fx ankle; Fx wrist; Gastritis; GERD (gastroesophageal reflux disease); H/O immunosuppressive therapy; History of orthostatic hypotension; Hyperlipidemia; Hyperparathyroidism; Hypothyroidism; IBS (irritable bowel syndrome); Ischemic colitis ([place]); Lactose intolerance; Leg ulcer ([place]); Melanoma ([place]); Nephropathy; 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  PSH:    Past Surgical History:  Procedure Laterality Date  . ABDOMINAL HYSTERECTOMY    . APPENDECTOMY    . BELOW KNEE LEG AMPUTATION     left, charcott joint from neuropathy  . CATARACT EXTRACTION    . CESAREAN SECTION     x 2  . CHOLECYSTECTOMY    . COLONOSCOPY  01/30/2012   Procedure: COLONOSCOPY;  Surgeon: Milus Banister, MD;  Location: [place];  Service: Endoscopy;  Laterality: N/A;  . COLONOSCOPY W/ BIOPSIES AND POLYPECTOMY  02/18/2009   adenomatous polyps, diverticulosis, internal hemorrhoids  . CORONARY ARTERY BYPASS GRAFT    . ESOPHAGOGASTRODUODENOSCOPY  05/02/2012   Procedure: ESOPHAGOGASTRODUODENOSCOPY (EGD);  Surgeon: Gatha Mayer, MD;  Location: [facility];  Service: Endoscopy;  Laterality: N/A;  . EUS  02/04/2010   w/FNA, pancreatic cyst  . KIDNEY TRANSPLANT  08/15/2000   [facility] in [place]  . THORACENTESIS  07/2016, 08/2016, 09/2016   Ordered by Dr. Vella Kohler. Surg: [facility], Dr. Anselm Pancoast  . THYROID SURGERY    . TONSILLECTOMY    . UPPER GASTROINTESTINAL ENDOSCOPY  12/01/2009  . VITRECTOMY     right eye    Current Outpatient Prescriptions  Medication Sig Dispense Refill  . acetaminophen (TYLENOL) 500 MG tablet Take 500 mg by mouth every 6 (six) hours as needed.    Marland Kitchen atorvastatin (LIPITOR) 10 MG tablet Take 1 tablet (10 mg total) by mouth every other day. 30 tablet 1  . cholecalciferol (VITAMIN D) 1000 UNITS tablet Take 2,000 Units by mouth daily.     Marland Kitchen  docusate sodium (COLACE) 100 MG capsule Take 100 mg by mouth daily as needed.     Marland Kitchen escitalopram (LEXAPRO) 10 MG tablet Take 10 mg by mouth daily.    . ferrous sulfate (SM IRON) 325 (65 FE) MG tablet Take 65 mg by mouth daily.     Marland Kitchen HYDROcodone-acetaminophen (NORCO/VICODIN) 5-325 MG per tablet Take 2 tablets by mouth 2 (two) times daily  as needed for moderate pain.     . hyoscyamine (ANASPAZ) 0.125 MG TBDP disintergrating tablet Place 0.125 mg under the tongue as needed for cramping.     . insulin glargine (LANTUS) 100 UNIT/ML injection Inject 9 Units into the skin every morning.     . insulin lispro (HUMALOG) 100 UNIT/ML injection Inject 2-9 Units into the skin 4 (four) times daily -  before meals and at bedtime. Per sliding scale.    . levothyroxine (SYNTHROID, LEVOTHROID) 112 MCG tablet Take 112 mcg by mouth daily before breakfast.    . magnesium gluconate (MAGONATE) 500 MG tablet Take 500 mg by mouth 2 (two) times daily.    . midodrine (PROAMATINE) 10 MG tablet Take 1 tablet (10 mg total) by mouth 3 (three) times daily as needed. 90 tablet 6  . mirtazapine (REMERON) 7.5 MG tablet Take 7.5 mg by mouth at bedtime.     . Multiple Vitamin (MULTIVITAMIN WITH MINERALS) TABS Take 1 tablet by mouth daily.    . mycophenolate (MYFORTIC) 360 MG TBEC Take 360 mg by mouth 2 (two) times daily.     . nitroGLYCERIN (NITROSTAT) 0.4 MG SL tablet Place 1 tablet (0.4 mg total) under the tongue every 5 (five) minutes as needed for chest pain. 25 tablet 3  . ondansetron (ZOFRAN) 4 MG tablet Take 4 mg by mouth as needed.     . potassium chloride (K-DUR) 10 MEQ tablet Take 10 mEq by mouth daily at 12 noon.     . predniSONE (DELTASONE) 5 MG tablet Take 5 mg by mouth every morning.     . ranitidine (ZANTAC) 150 MG tablet Take 150 mg by mouth at bedtime.    . tacrolimus (PROGRAF) 0.5 MG capsule Take 1-2 mg by mouth 2 (two) times daily. Take 2mg  in the am & 1mg  at night    . torsemide (DEMADEX) 20 MG tablet TAKE ONE TABLET BY MOUTH TWICE DAILY AS NEEDED 180 tablet 2  . warfarin (COUMADIN) 2.5 MG tablet TAKE AS DIRECTED BY  COUMADIN CLINIC (Patient taking differently: TAKE AS DIRECTED BY  COUMADIN CLINIC. Take 2.5mg  Monday, Wednesday, Friday, Saturday.) 150 tablet 1  . warfarin (COUMADIN) 5 MG tablet Take 5 mg by mouth daily.     No current  facility-administered medications for this visit.      Allergies:   Azithromycin; Erythromycin; Fentanyl; and Percocet [oxycodone-acetaminophen]   Social History:  The patient  reports that she has never smoked. She has never used smokeless tobacco. She reports that she does not drink alcohol or use drugs.   Family History:   family history includes Heart attack (age of onset: 62) in her father; Stroke (age of onset: 40) in her mother.    Review of Systems: Review of Systems  Constitutional: Positive for malaise/fatigue.  Respiratory: Negative.   Cardiovascular: Negative.   Gastrointestinal: Positive for nausea and vomiting.  Musculoskeletal: Negative.        Unable to ambulate  Neurological: Positive for weakness.  Psychiatric/Behavioral: Negative.   All other systems reviewed and are negative.    PHYSICAL EXAM:  VS:  BP 90/60 (BP Location: Left Arm, Patient Position: Sitting, Cuff Size: Normal)   Pulse (!) 52   Ht 5' 3.5" (1.613 m)   Wt 132 lb (59.9 kg)   BMI 23.02 kg/m  , BMI Body mass index is 23.02 kg/m. GEN: Thin, in no acute distress , presenting in wheelchair, quiet but will answer questions appropriately HEENT: normal  Neck: no JVD, carotid bruits, or masses Cardiac: RRR; no murmurs, rubs, or gallops,no edema  Respiratory:  Dull to percussion right base up 4 cm,  left clear to the base normal work of breathing GI: soft, nontender, nondistended, + BS MS: no deformity or atrophy , leg amputation on the left,  prosthesis on the left Skin: warm and dry, no rash Neuro:  Strength and sensation are intact Psych: euthymic mood, full affect    Recent Labs: 10/05/2016: BNP 393.6    Lipid Panel Lab Results  Component Value Date   CHOL 115 08/14/2014   HDL 59 08/14/2014   LDLCALC 44 08/14/2014   TRIG 62 08/14/2014      Wt Readings from Last 3 Encounters:  11/02/16 132 lb (59.9 kg)  09/30/16 134 lb 8 oz (61 kg)  02/05/16 136 lb 4 oz (61.8 kg)        ASSESSMENT AND PLAN:  Orthostasis/hypotension No symptoms of orthostasis per patient's husband, takes midodrine Tolerating this well Was previously on Florinef, not an ideal medication at this time given fluid retention, pleural effusion  Atherosclerosis of native coronary artery of native heart without angina pectoris - Plan: EKG 12-Lead Currently with no symptoms of angina. No further workup at this time. Continue current medication regimen.  Paroxysmal atrial fibrillation (HCC) - Plan: EKG 12-Lead Appears to be maintaining normal sinus rhythm, on warfarin  Deep vein thrombosis (DVT) of upper extremity, unspecified chronicity, unspecified laterality, unspecified vein (HCC) - Plan: EKG 12-Lead Currently on warfarin, therapeutic  Pleural effusion due to congestive heart failure ([place]) - Plan: EKG 12-Lead Pleural effusion seems to be stable if not better on the right, minimal if any on the left She denies shortness of breath. She is sedentary, not able to ambulate Recommended we continue more aggressive diuresis alternating 20 mg with 10 mg goal weight 131 pounds.   Encounter for anticoagulation discussion and counseling - Plan: EKG 12-Lead Tolerating warfarin, no recent bleeding CBC reviewed, stable   Total encounter time more than 25 minutes  Greater than 50% was spent in counseling and coordination of care with the patient   Disposition:   F/U  3 months   Orders Placed This Encounter  Procedures  . EKG 12-Lead     Signed, Esmond Plants, M.D., Ph.D. 11/02/2016  [place], [place]

## 2016-11-02 ENCOUNTER — Encounter: Payer: Self-pay | Admitting: Cardiovascular Disease

## 2016-11-02 ENCOUNTER — Ambulatory Visit (INDEPENDENT_AMBULATORY_CARE_PROVIDER_SITE_OTHER): Payer: Medicare Other

## 2016-11-02 ENCOUNTER — Ambulatory Visit (INDEPENDENT_AMBULATORY_CARE_PROVIDER_SITE_OTHER): Payer: Medicare Other | Admitting: Cardiovascular Disease

## 2016-11-02 VITALS — BP 90/60 | HR 52 | Ht 63.5 in | Wt 132.0 lb

## 2016-11-02 DIAGNOSIS — Z7189 Other specified counseling: Secondary | ICD-10-CM | POA: Diagnosis not present

## 2016-11-02 DIAGNOSIS — I82629 Acute embolism and thrombosis of deep veins of unspecified upper extremity: Secondary | ICD-10-CM

## 2016-11-02 DIAGNOSIS — I639 Cerebral infarction, unspecified: Secondary | ICD-10-CM

## 2016-11-02 DIAGNOSIS — I2510 Atherosclerotic heart disease of native coronary artery without angina pectoris: Secondary | ICD-10-CM | POA: Diagnosis not present

## 2016-11-02 DIAGNOSIS — I509 Heart failure, unspecified: Secondary | ICD-10-CM

## 2016-11-02 DIAGNOSIS — I4891 Unspecified atrial fibrillation: Secondary | ICD-10-CM

## 2016-11-02 DIAGNOSIS — I10 Essential (primary) hypertension: Secondary | ICD-10-CM | POA: Diagnosis not present

## 2016-11-02 DIAGNOSIS — I480 Paroxysmal atrial fibrillation: Secondary | ICD-10-CM

## 2016-11-02 DIAGNOSIS — Z5181 Encounter for therapeutic drug level monitoring: Secondary | ICD-10-CM

## 2016-11-02 LAB — POCT INR: INR: 2.2

## 2016-11-02 MED ORDER — PROMETHAZINE HCL 25 MG PO TABS: 25.0000 mg | ORAL_TABLET | Freq: Four times a day (QID) | ORAL | 1 refills | Status: DC | PRN

## 2016-11-02 NOTE — Patient Instructions (Addendum)
Medication Instructions:   No medication changes made  Try phenergen for nausea 1/2 to 1 whole pill  Labwork:  No new labs needed  Testing/Procedures:  No further testing at this time   I recommend watching educational videos on topics of interest to you at:       [email or web address]  Enter code: HEARTCARE    Follow-Up: It was a pleasure seeing you in the office today. Please call us if you have new issues that need to be addressed before your next appt.  [phone]  Your physician wants you to follow-up in: 3 month.  Y If you need a refill on your cardiac medications before your next appointment, please call your pharmacy.

## 2016-11-07 LAB — ACID FAST SMEAR (AFB): ACID FAST SMEAR - AFSCU2: NEGATIVE

## 2016-11-11 ENCOUNTER — Other Ambulatory Visit: Payer: Self-pay | Admitting: Cardiovascular Disease

## 2016-11-11 ENCOUNTER — Telehealth: Payer: Self-pay | Admitting: Cardiovascular Disease

## 2016-11-11 NOTE — Telephone Encounter (Signed)
Pt has been approved for Promethazine Hcl  Coverage from 08/22/16-11/10/17. Pharmacy and pt aware. Pt has picked up prescription from pharmacy.

## 2016-11-11 NOTE — Telephone Encounter (Signed)
Please review for refill. Thanks!  

## 2016-11-20 DIAGNOSIS — J189 Pneumonia, unspecified organism: Secondary | ICD-10-CM

## 2016-11-20 HISTORY — DX: Pneumonia, unspecified organism: J18.9

## 2016-11-21 ENCOUNTER — Other Ambulatory Visit: Payer: Self-pay | Admitting: Specialist

## 2016-11-21 DIAGNOSIS — J90 Pleural effusion, not elsewhere classified: Secondary | ICD-10-CM

## 2016-11-21 NOTE — Discharge Instructions (Signed)
Thoracentesis, Care After °Refer to this sheet in the next few weeks. These instructions provide you with information about caring for yourself after your procedure. Your health care provider may also give you more specific instructions. Your treatment has been planned according to current medical practices, but problems sometimes occur. Call your health care provider if you have any problems or questions after your procedure. °What can I expect after the procedure? °After your procedure, it is common to have pain at the puncture site. °Follow these instructions at home: °· Take medicines only as directed by your health care provider. °· You may return to your normal diet and normal activities as directed by your health care provider. °· Drink enough fluid to keep your urine clear or pale yellow. °· Do not take baths, swim, or use a hot tub until your health care provider approves. °· Follow your health care provider's instructions about: °¨ Puncture site care. °¨ Bandage (dressing) changes and removal. °· Check your puncture site every day for signs of infection. Watch for: °¨ Redness, swelling, or pain. °¨ Fluid, blood, or pus. °· Keep all follow-up visits as directed by your health care provider. This is important. °Contact a health care provider if: °· You have redness, swelling, or pain at your puncture site. °· You have fluid, blood, or pus coming from your puncture site. °· You have a fever. °· You have chills. °· You have nausea or vomiting. °· You have trouble breathing. °· You develop a worsening cough. °Get help right away if: °· You have extreme shortness of breath. °· You develop chest pain. °· You faint or feel light-headed. °This information is not intended to replace advice given to you by your health care provider. Make sure you discuss any questions you have with your health care provider. °Document Released: 08/29/2014 Document Revised: 04/09/2016 Document Reviewed: 05/20/2014 °Elsevier  Interactive Patient Education © 2017 [employer]. ° °

## 2016-11-22 ENCOUNTER — Inpatient Hospital Stay (HOSPITAL_COMMUNITY)
Admission: EM | Admit: 2016-11-22 | Discharge: 2016-12-02 | DRG: 193 | Disposition: A | Discharge status: Home / Self Care | Payer: Medicare Other | Attending: Oncology | Admitting: Oncology

## 2016-11-22 ENCOUNTER — Emergency Department (HOSPITAL_COMMUNITY): Payer: Medicare Other

## 2016-11-22 ENCOUNTER — Encounter (HOSPITAL_COMMUNITY): Payer: Self-pay | Admitting: Emergency Medicine

## 2016-11-22 DIAGNOSIS — E890 Postprocedural hypothyroidism: Secondary | ICD-10-CM | POA: Diagnosis present

## 2016-11-22 DIAGNOSIS — R413 Other amnesia: Secondary | ICD-10-CM | POA: Diagnosis not present

## 2016-11-22 DIAGNOSIS — R001 Bradycardia, unspecified: Secondary | ICD-10-CM | POA: Diagnosis present

## 2016-11-22 DIAGNOSIS — J101 Influenza due to other identified influenza virus with other respiratory manifestations: Secondary | ICD-10-CM | POA: Diagnosis present

## 2016-11-22 DIAGNOSIS — Z66 Do not resuscitate: Secondary | ICD-10-CM | POA: Diagnosis present

## 2016-11-22 DIAGNOSIS — J1008 Influenza due to other identified influenza virus with other specified pneumonia: Secondary | ICD-10-CM | POA: Diagnosis present

## 2016-11-22 DIAGNOSIS — J1000 Influenza due to other identified influenza virus with unspecified type of pneumonia: Secondary | ICD-10-CM | POA: Diagnosis not present

## 2016-11-22 DIAGNOSIS — J1100 Influenza due to unidentified influenza virus with unspecified type of pneumonia: Secondary | ICD-10-CM | POA: Diagnosis present

## 2016-11-22 DIAGNOSIS — Z881 Allergy status to other antibiotic agents status: Secondary | ICD-10-CM | POA: Diagnosis not present

## 2016-11-22 DIAGNOSIS — T502X5A Adverse effect of carbonic-anhydrase inhibitors, benzothiadiazides and other diuretics, initial encounter: Secondary | ICD-10-CM | POA: Diagnosis present

## 2016-11-22 DIAGNOSIS — E872 Acidosis: Secondary | ICD-10-CM | POA: Diagnosis present

## 2016-11-22 DIAGNOSIS — I509 Heart failure, unspecified: Secondary | ICD-10-CM

## 2016-11-22 DIAGNOSIS — I5032 Chronic diastolic (congestive) heart failure: Secondary | ICD-10-CM | POA: Diagnosis present

## 2016-11-22 DIAGNOSIS — J918 Pleural effusion in other conditions classified elsewhere: Secondary | ICD-10-CM | POA: Diagnosis not present

## 2016-11-22 DIAGNOSIS — D509 Iron deficiency anemia, unspecified: Secondary | ICD-10-CM | POA: Diagnosis present

## 2016-11-22 DIAGNOSIS — D72818 Other decreased white blood cell count: Secondary | ICD-10-CM | POA: Diagnosis not present

## 2016-11-22 DIAGNOSIS — E1122 Type 2 diabetes mellitus with diabetic chronic kidney disease: Secondary | ICD-10-CM | POA: Diagnosis present

## 2016-11-22 DIAGNOSIS — Z7901 Long term (current) use of anticoagulants: Secondary | ICD-10-CM

## 2016-11-22 DIAGNOSIS — E1151 Type 2 diabetes mellitus with diabetic peripheral angiopathy without gangrene: Secondary | ICD-10-CM | POA: Diagnosis present

## 2016-11-22 DIAGNOSIS — Z79899 Other long term (current) drug therapy: Secondary | ICD-10-CM

## 2016-11-22 DIAGNOSIS — J181 Lobar pneumonia, unspecified organism: Secondary | ICD-10-CM

## 2016-11-22 DIAGNOSIS — Z86711 Personal history of pulmonary embolism: Secondary | ICD-10-CM

## 2016-11-22 DIAGNOSIS — Z885 Allergy status to narcotic agent status: Secondary | ICD-10-CM | POA: Diagnosis not present

## 2016-11-22 DIAGNOSIS — Z8701 Personal history of pneumonia (recurrent): Secondary | ICD-10-CM

## 2016-11-22 DIAGNOSIS — J159 Unspecified bacterial pneumonia: Secondary | ICD-10-CM | POA: Diagnosis present

## 2016-11-22 DIAGNOSIS — Z8673 Personal history of transient ischemic attack (TIA), and cerebral infarction without residual deficits: Secondary | ICD-10-CM | POA: Diagnosis not present

## 2016-11-22 DIAGNOSIS — N184 Chronic kidney disease, stage 4 (severe): Secondary | ICD-10-CM | POA: Diagnosis present

## 2016-11-22 DIAGNOSIS — J811 Chronic pulmonary edema: Secondary | ICD-10-CM

## 2016-11-22 DIAGNOSIS — B001 Herpesviral vesicular dermatitis: Secondary | ICD-10-CM | POA: Diagnosis not present

## 2016-11-22 DIAGNOSIS — Z85820 Personal history of malignant melanoma of skin: Secondary | ICD-10-CM | POA: Diagnosis not present

## 2016-11-22 DIAGNOSIS — Z803 Family history of malignant neoplasm of breast: Secondary | ICD-10-CM

## 2016-11-22 DIAGNOSIS — E11649 Type 2 diabetes mellitus with hypoglycemia without coma: Secondary | ICD-10-CM | POA: Diagnosis not present

## 2016-11-22 DIAGNOSIS — Z794 Long term (current) use of insulin: Secondary | ICD-10-CM | POA: Diagnosis not present

## 2016-11-22 DIAGNOSIS — K219 Gastro-esophageal reflux disease without esophagitis: Secondary | ICD-10-CM | POA: Diagnosis present

## 2016-11-22 DIAGNOSIS — I5030 Unspecified diastolic (congestive) heart failure: Secondary | ICD-10-CM | POA: Diagnosis not present

## 2016-11-22 DIAGNOSIS — I2510 Atherosclerotic heart disease of native coronary artery without angina pectoris: Secondary | ICD-10-CM | POA: Diagnosis present

## 2016-11-22 DIAGNOSIS — I951 Orthostatic hypotension: Secondary | ICD-10-CM | POA: Diagnosis present

## 2016-11-22 DIAGNOSIS — Z86718 Personal history of other venous thrombosis and embolism: Secondary | ICD-10-CM

## 2016-11-22 DIAGNOSIS — Z951 Presence of aortocoronary bypass graft: Secondary | ICD-10-CM

## 2016-11-22 DIAGNOSIS — Z888 Allergy status to other drugs, medicaments and biological substances status: Secondary | ICD-10-CM

## 2016-11-22 DIAGNOSIS — R6251 Failure to thrive (child): Secondary | ICD-10-CM

## 2016-11-22 DIAGNOSIS — J9620 Acute and chronic respiratory failure, unspecified whether with hypoxia or hypercapnia: Secondary | ICD-10-CM | POA: Diagnosis present

## 2016-11-22 DIAGNOSIS — I110 Hypertensive heart disease with heart failure: Secondary | ICD-10-CM | POA: Diagnosis not present

## 2016-11-22 DIAGNOSIS — B009 Herpesviral infection, unspecified: Secondary | ICD-10-CM

## 2016-11-22 DIAGNOSIS — R509 Fever, unspecified: Secondary | ICD-10-CM | POA: Diagnosis present

## 2016-11-22 DIAGNOSIS — I119 Hypertensive heart disease without heart failure: Secondary | ICD-10-CM | POA: Diagnosis present

## 2016-11-22 DIAGNOSIS — R627 Adult failure to thrive: Secondary | ICD-10-CM | POA: Diagnosis not present

## 2016-11-22 DIAGNOSIS — Z823 Family history of stroke: Secondary | ICD-10-CM

## 2016-11-22 DIAGNOSIS — Z940 Kidney transplant status: Secondary | ICD-10-CM | POA: Diagnosis not present

## 2016-11-22 DIAGNOSIS — I480 Paroxysmal atrial fibrillation: Secondary | ICD-10-CM | POA: Diagnosis present

## 2016-11-22 DIAGNOSIS — Z89512 Acquired absence of left leg below knee: Secondary | ICD-10-CM | POA: Diagnosis not present

## 2016-11-22 DIAGNOSIS — Z8249 Family history of ischemic heart disease and other diseases of the circulatory system: Secondary | ICD-10-CM | POA: Diagnosis not present

## 2016-11-22 DIAGNOSIS — E785 Hyperlipidemia, unspecified: Secondary | ICD-10-CM | POA: Diagnosis not present

## 2016-11-22 DIAGNOSIS — Z90710 Acquired absence of both cervix and uterus: Secondary | ICD-10-CM

## 2016-11-22 DIAGNOSIS — R8271 Bacteriuria: Secondary | ICD-10-CM | POA: Diagnosis not present

## 2016-11-22 DIAGNOSIS — J9621 Acute and chronic respiratory failure with hypoxia: Secondary | ICD-10-CM | POA: Diagnosis present

## 2016-11-22 DIAGNOSIS — I5033 Acute on chronic diastolic (congestive) heart failure: Secondary | ICD-10-CM | POA: Diagnosis not present

## 2016-11-22 DIAGNOSIS — N183 Chronic kidney disease, stage 3 (moderate): Secondary | ICD-10-CM | POA: Diagnosis not present

## 2016-11-22 DIAGNOSIS — J111 Influenza due to unidentified influenza virus with other respiratory manifestations: Secondary | ICD-10-CM

## 2016-11-22 DIAGNOSIS — E11319 Type 2 diabetes mellitus with unspecified diabetic retinopathy without macular edema: Secondary | ICD-10-CM | POA: Diagnosis present

## 2016-11-22 DIAGNOSIS — M810 Age-related osteoporosis without current pathological fracture: Secondary | ICD-10-CM | POA: Diagnosis present

## 2016-11-22 DIAGNOSIS — E039 Hypothyroidism, unspecified: Secondary | ICD-10-CM | POA: Diagnosis not present

## 2016-11-22 DIAGNOSIS — J189 Pneumonia, unspecified organism: Secondary | ICD-10-CM

## 2016-11-22 DIAGNOSIS — R410 Disorientation, unspecified: Secondary | ICD-10-CM | POA: Diagnosis not present

## 2016-11-22 DIAGNOSIS — J90 Pleural effusion, not elsewhere classified: Secondary | ICD-10-CM | POA: Diagnosis not present

## 2016-11-22 DIAGNOSIS — E119 Type 2 diabetes mellitus without complications: Secondary | ICD-10-CM | POA: Diagnosis not present

## 2016-11-22 DIAGNOSIS — Z7952 Long term (current) use of systemic steroids: Secondary | ICD-10-CM

## 2016-11-22 DIAGNOSIS — R0902 Hypoxemia: Secondary | ICD-10-CM | POA: Diagnosis present

## 2016-11-22 DIAGNOSIS — Z9889 Other specified postprocedural states: Secondary | ICD-10-CM

## 2016-11-22 DIAGNOSIS — E739 Lactose intolerance, unspecified: Secondary | ICD-10-CM | POA: Diagnosis present

## 2016-11-22 DIAGNOSIS — J9601 Acute respiratory failure with hypoxia: Secondary | ICD-10-CM | POA: Diagnosis not present

## 2016-11-22 DIAGNOSIS — E11610 Type 2 diabetes mellitus with diabetic neuropathic arthropathy: Secondary | ICD-10-CM | POA: Diagnosis present

## 2016-11-22 HISTORY — DX: Pneumonia, unspecified organism: J18.9

## 2016-11-22 HISTORY — DX: Influenza due to unidentified influenza virus with other respiratory manifestations: J11.1

## 2016-11-22 LAB — COMPREHENSIVE METABOLIC PANEL
ALK PHOS: 47 U/L (ref 38–126)
ALT: 17 U/L (ref 14–54)
ANION GAP: 11 (ref 5–15)
AST: 27 U/L (ref 15–41)
Albumin: 3.1 g/dL — ABNORMAL LOW (ref 3.5–5.0)
BILIRUBIN TOTAL: 0.7 mg/dL (ref 0.3–1.2)
BUN: 57 mg/dL — ABNORMAL HIGH (ref 6–20)
CALCIUM: 8.8 mg/dL — AB (ref 8.9–10.3)
CO2: 30 mmol/L (ref 22–32)
CREATININE: 1.91 mg/dL — AB (ref 0.44–1.00)
Chloride: 96 mmol/L — ABNORMAL LOW (ref 101–111)
GFR calc non Af Amer: 24 mL/min — ABNORMAL LOW (ref >60)
GFR, EST AFRICAN AMERICAN: 28 mL/min — AB (ref >60)
Glucose, Bld: 218 mg/dL — ABNORMAL HIGH (ref 65–99)
Potassium: 4.4 mmol/L (ref 3.5–5.1)
Sodium: 137 mmol/L (ref 135–145)
Total Protein: 5.3 g/dL — ABNORMAL LOW (ref 6.5–8.1)

## 2016-11-22 LAB — GLUCOSE, CAPILLARY
Glucose-Capillary: 145 mg/dL — ABNORMAL HIGH (ref 65–99)
Glucose-Capillary: 269 mg/dL — ABNORMAL HIGH (ref 65–99)

## 2016-11-22 LAB — CBC WITH DIFFERENTIAL/PLATELET
Basophils Absolute: 0 10*3/uL (ref 0.0–0.1)
Basophils Relative: 0 %
EOS ABS: 0 10*3/uL (ref 0.0–0.7)
Eosinophils Relative: 0 %
HCT: 30.8 % — ABNORMAL LOW (ref 36.0–46.0)
HEMOGLOBIN: 10 g/dL — AB (ref 12.0–15.0)
LYMPHS ABS: 0.5 10*3/uL — AB (ref 0.7–4.0)
LYMPHS PCT: 11 %
MCH: 30.7 pg (ref 26.0–34.0)
MCHC: 32.5 g/dL (ref 30.0–36.0)
MCV: 94.5 fL (ref 78.0–100.0)
MONOS PCT: 8 %
Monocytes Absolute: 0.4 10*3/uL (ref 0.1–1.0)
NEUTROS ABS: 3.8 10*3/uL (ref 1.7–7.7)
NEUTROS PCT: 81 %
Platelets: 141 10*3/uL — ABNORMAL LOW (ref 150–400)
RBC: 3.26 MIL/uL — ABNORMAL LOW (ref 3.87–5.11)
RDW: 16.2 % — ABNORMAL HIGH (ref 11.5–15.5)
WBC: 4.7 10*3/uL (ref 4.0–10.5)

## 2016-11-22 LAB — I-STAT CG4 LACTIC ACID, ED
LACTIC ACID, VENOUS: 0.95 mmol/L (ref 0.5–1.9)
Lactic Acid, Venous: 2.02 mmol/L (ref 0.5–1.9)

## 2016-11-22 LAB — PROTIME-INR
INR: 1.95
Prothrombin Time: 22.5 seconds — ABNORMAL HIGH (ref 11.4–15.2)

## 2016-11-22 LAB — LIPASE, BLOOD: Lipase: 12 U/L (ref 11–51)

## 2016-11-22 LAB — I-STAT TROPONIN, ED: TROPONIN I, POC: 0.04 ng/mL (ref 0.00–0.08)

## 2016-11-22 LAB — INFLUENZA PANEL BY PCR (TYPE A & B)
Influenza A By PCR: NEGATIVE
Influenza B By PCR: POSITIVE — AB

## 2016-11-22 MED ORDER — POTASSIUM CHLORIDE ER 10 MEQ PO TBCR
10.0000 meq | EXTENDED_RELEASE_TABLET | Freq: Every day | ORAL | Status: DC
  Administered 2016-11-23 – 2016-12-02 (×9): 10 meq via ORAL
  Filled 2016-11-22 (×18): qty 1

## 2016-11-22 MED ORDER — TACROLIMUS 1 MG PO CAPS
1.0000 mg | ORAL_CAPSULE | Freq: Every evening | ORAL | Status: DC
  Administered 2016-11-22 – 2016-12-01 (×10): 1 mg via ORAL
  Filled 2016-11-22 (×12): qty 1

## 2016-11-22 MED ORDER — INSULIN GLARGINE 100 UNIT/ML ~~LOC~~ SOLN
6.0000 [IU] | Freq: Every day | SUBCUTANEOUS | Status: DC
  Administered 2016-11-22: 6 [IU] via SUBCUTANEOUS
  Filled 2016-11-22: qty 0.06

## 2016-11-22 MED ORDER — LEVOFLOXACIN IN D5W 750 MG/150ML IV SOLN
750.0000 mg | Freq: Once | INTRAVENOUS | Status: AC
  Administered 2016-11-22: 750 mg via INTRAVENOUS
  Filled 2016-11-22: qty 150

## 2016-11-22 MED ORDER — WARFARIN - PHARMACIST DOSING INPATIENT
Freq: Every day | Status: DC
  Administered 2016-11-24 – 2016-11-30 (×4)

## 2016-11-22 MED ORDER — SODIUM CHLORIDE 0.9 % IV SOLN
INTRAVENOUS | Status: AC
  Administered 2016-11-22 – 2016-11-23 (×2): via INTRAVENOUS

## 2016-11-22 MED ORDER — PHENOL 1.4 % MT LIQD: 1.0000 | OROMUCOSAL | Status: DC | PRN

## 2016-11-22 MED ORDER — ONDANSETRON HCL 4 MG/2ML IJ SOLN
4.0000 mg | Freq: Four times a day (QID) | INTRAMUSCULAR | Status: DC | PRN
  Administered 2016-11-23 – 2016-12-01 (×7): 4 mg via INTRAVENOUS
  Filled 2016-11-22 (×11): qty 2

## 2016-11-22 MED ORDER — INSULIN ASPART 100 UNIT/ML ~~LOC~~ SOLN
0.0000 [IU] | Freq: Three times a day (TID) | SUBCUTANEOUS | Status: DC
  Administered 2016-11-22: 1 [IU] via SUBCUTANEOUS
  Administered 2016-11-23: 3 [IU] via SUBCUTANEOUS
  Administered 2016-11-23: 5 [IU] via SUBCUTANEOUS

## 2016-11-22 MED ORDER — ACETAMINOPHEN 325 MG PO TABS
650.0000 mg | ORAL_TABLET | Freq: Once | ORAL | Status: AC
  Administered 2016-11-22: 650 mg via ORAL
  Filled 2016-11-22: qty 2

## 2016-11-22 MED ORDER — PREDNISONE 5 MG PO TABS
5.0000 mg | ORAL_TABLET | Freq: Every morning | ORAL | Status: DC
  Administered 2016-11-23 – 2016-11-24 (×2): 5 mg via ORAL
  Filled 2016-11-22 (×2): qty 1

## 2016-11-22 MED ORDER — OSELTAMIVIR PHOSPHATE 30 MG PO CAPS
30.0000 mg | ORAL_CAPSULE | Freq: Every day | ORAL | Status: AC
  Administered 2016-11-23 – 2016-11-26 (×4): 30 mg via ORAL
  Filled 2016-11-22 (×4): qty 1

## 2016-11-22 MED ORDER — ESCITALOPRAM OXALATE 10 MG PO TABS
10.0000 mg | ORAL_TABLET | Freq: Every day | ORAL | Status: DC
  Administered 2016-11-23 – 2016-12-02 (×10): 10 mg via ORAL
  Filled 2016-11-22 (×10): qty 1

## 2016-11-22 MED ORDER — ATORVASTATIN CALCIUM 10 MG PO TABS
10.0000 mg | ORAL_TABLET | ORAL | Status: DC
  Administered 2016-11-22 – 2016-12-02 (×6): 10 mg via ORAL
  Filled 2016-11-22 (×6): qty 1

## 2016-11-22 MED ORDER — ONDANSETRON HCL 4 MG/2ML IJ SOLN
4.0000 mg | Freq: Once | INTRAMUSCULAR | Status: AC
  Administered 2016-11-22: 4 mg via INTRAVENOUS
  Filled 2016-11-22: qty 2

## 2016-11-22 MED ORDER — VITAMIN D 1000 UNITS PO TABS
2000.0000 [IU] | ORAL_TABLET | Freq: Every day | ORAL | Status: DC
  Administered 2016-11-23 – 2016-12-02 (×10): 2000 [IU] via ORAL
  Filled 2016-11-22 (×11): qty 2

## 2016-11-22 MED ORDER — MYCOPHENOLATE SODIUM 180 MG PO TBEC
360.0000 mg | DELAYED_RELEASE_TABLET | Freq: Two times a day (BID) | ORAL | Status: DC
  Administered 2016-11-22 – 2016-12-02 (×20): 360 mg via ORAL
  Filled 2016-11-22 (×21): qty 2

## 2016-11-22 MED ORDER — TACROLIMUS 1 MG PO CAPS: 1.0000 mg | ORAL_CAPSULE | Freq: Two times a day (BID) | ORAL | Status: DC

## 2016-11-22 MED ORDER — LEVOTHYROXINE SODIUM 112 MCG PO TABS
112.0000 ug | ORAL_TABLET | Freq: Every day | ORAL | Status: DC
  Administered 2016-11-23 – 2016-12-02 (×10): 112 ug via ORAL
  Filled 2016-11-22 (×10): qty 1

## 2016-11-22 MED ORDER — ENSURE ENLIVE PO LIQD
237.0000 mL | Freq: Two times a day (BID) | ORAL | Status: DC
  Administered 2016-11-22 – 2016-11-23 (×2): 237 mL via ORAL

## 2016-11-22 MED ORDER — SODIUM CHLORIDE 0.9 % IV BOLUS (SEPSIS)
1000.0000 mL | Freq: Once | INTRAVENOUS | Status: AC
  Administered 2016-11-22: 1000 mL via INTRAVENOUS

## 2016-11-22 MED ORDER — FERROUS SULFATE 325 (65 FE) MG PO TABS
325.0000 mg | ORAL_TABLET | Freq: Every day | ORAL | Status: DC
  Administered 2016-11-22 – 2016-12-02 (×11): 325 mg via ORAL
  Filled 2016-11-22 (×11): qty 1

## 2016-11-22 MED ORDER — TACROLIMUS 1 MG PO CAPS
2.0000 mg | ORAL_CAPSULE | Freq: Every morning | ORAL | Status: DC
  Administered 2016-11-23 – 2016-12-02 (×10): 2 mg via ORAL
  Filled 2016-11-22 (×10): qty 2

## 2016-11-22 MED ORDER — OSELTAMIVIR PHOSPHATE 75 MG PO CAPS
75.0000 mg | ORAL_CAPSULE | Freq: Two times a day (BID) | ORAL | Status: DC
  Administered 2016-11-22: 75 mg via ORAL
  Filled 2016-11-22: qty 1

## 2016-11-22 MED ORDER — WARFARIN SODIUM 5 MG PO TABS
5.0000 mg | ORAL_TABLET | Freq: Once | ORAL | Status: AC
  Administered 2016-11-22: 5 mg via ORAL
  Filled 2016-11-22: qty 1

## 2016-11-22 MED ORDER — ACETAMINOPHEN 650 MG RE SUPP: 650.0000 mg | Freq: Four times a day (QID) | RECTAL | Status: DC | PRN

## 2016-11-22 MED ORDER — ACETAMINOPHEN 325 MG PO TABS
650.0000 mg | ORAL_TABLET | Freq: Four times a day (QID) | ORAL | Status: DC | PRN
  Administered 2016-11-23 – 2016-11-29 (×3): 650 mg via ORAL
  Filled 2016-11-22 (×2): qty 2

## 2016-11-22 NOTE — ED Notes (Signed)
ED Provider at bedside. 

## 2016-11-22 NOTE — Progress Notes (Signed)
PHARMACY NOTE:  ANTIMICROBIAL RENAL DOSAGE ADJUSTMENT  Current antimicrobial regimen includes a mismatch between antimicrobial dosage and estimated renal function.  As per policy approved by the Pharmacy & Therapeutics and Medical Executive Committees, the antimicrobial dosage will be adjusted accordingly.  Current antimicrobial dosage:  Tamiflu 75mg  PO BID  Indication: Flu B +  Renal Function:  CrCl~23    Antimicrobial dosage has been changed to:  Tamiflu 30mg  PO daily    Elicia Lamp, PharmD, BCPS Clinical Pharmacist 11/22/2016 3:18 PM

## 2016-11-22 NOTE — ED Notes (Signed)
Patient transported to X-ray 

## 2016-11-22 NOTE — Progress Notes (Signed)
New Admission Note:   Arrival Method: Bed Mental Orientation: A&O X4 Telemetry: Initiated Assessment: Completed Skin: See flowsheets IV: Clean, Dry, Intact, Infusing  Pain: Denies Admission: Completed Unit Orientation: Patient has been orientated to the room, unit and staff.  Family: Husband at bedside  Orders have been reviewed and implemented. Will continue to monitor the patient. Call light has been placed within reach and bed alarm has been activated.    Dixie Dials RN, BSN

## 2016-11-22 NOTE — H&P (Signed)
Date: 11/22/2016               Patient Name:  Elizabeth [name] MRN: [id number]  DOB: 1940/09/05 Age / Sex: 76 y.o., female   PCP: Donato Heinz, MD         Medical Service: Internal Medicine Teaching Service         Attending Physician: Dr. Annia Belt, MD    First Contact: Dr. Philipp Ovens Pager: [phone]  Second Contact: Dr. 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 Patient reports she was diagnosed with pneumonia back in October 2017. She was treated with antibiotics and initially improved, but subsequently developed a right-sided pleural effusion. There was initial concern for a complicated parapneumonic effusion due to her recent pneumonia, but thoracentesis and fluid analysis was consistent with a transudative process. She was referred to cardiology and diagnosed with diastolic heart failure felt to be causing her effusion. Over the past month she has had 3 thoracentesis done due to recurrent effusion. Today, patient reports symptoms of cough productive of clear sputum and congestion that has progressively worsened over the past week. Husband says he took her temperature this morning and noted it was 103.5. Given her complicated medical history and immunosuppression he brought her into the ED for evaluation. Recent sick contacts include a granddaughter who recently had the flu.   On arrival to the ED, patient was febrile to 102.14F and hemodynamically stable with blood pressure 122/106, heart rate  is 65, respiratory rate of 14, and oxygen 93% on room air. Chest x-ray revealed near complete resolution of her prior left pleural effusion with a small subpulmonic right pleural effusion and right basilar atelectasis. CBC was without leukocytosis (4.7) and notable for a normocytic anemia with hemoglobin of 10. CMP was significant for elevated creatinine of 1.9 (unclear baseline, 1.2 approximately 2 years ago). Patient takes warfarin for atrial fibrillation and INR was 1.95. I-STAT troponin was 0.04. Lactic acid was 2.02. Influenza panel was positive for flu B.    Meds:  No outpatient prescriptions have been marked as taking for the 11/22/16 encounter [facility] Encounter).     Allergies: Allergies as of 11/22/2016 - Review Complete 11/22/2016  Allergen Reaction Noted  . Azithromycin Other (See Comments) 02/08/2016  . Erythromycin Nausea And Vomiting 11/19/2013  . Fentanyl Nausea And Vomiting 11/19/2013  . Percocet [oxycodone-acetaminophen] Nausea And Vomiting 01/27/2012   Past Medical History:  Diagnosis Date  . Adenomatous polyp   . Anemia   . 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 Bradycardia   . Carotid artery stenosis    mild  . Carotid bruit    left  . Chronic kidney disease    s/p cadaveric renal transplant in 2001  . Colitis   . Compression fracture of L4 lumbar vertebra (HCC)   . Coronary artery disease   . CVA (cerebral infarction)   . Diabetes mellitus   .  Diabetic retinopathy   . Esophageal candidiasis ([place])   . Fracture of right maxilla 09/21/2012  . Fx ankle   . Fx wrist   . Gastritis   . GERD (gastroesophageal reflux disease)   . H/O immunosuppressive therapy   . History of orthostatic hypotension   . Hyperlipidemia   . Hyperparathyroidism   . Hypothyroidism   . IBS (irritable bowel syndrome)   . Ischemic colitis ([place])   . Lactose intolerance   . Leg ulcer ([place])     from poor fitting prosthetic  . Melanoma ([place])   . Nephropathy   . 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 Neuropathy ([place])   . Osteoporosis    recurrent fractures  . Pancreatic cyst   . Pulmonary embolism ([place])   . PVD (peripheral vascular disease) ([place])   . Renal artery stenosis ([place])   . 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 Renal osteodystrophy   . Right orbit fracture ([place]) 09/21/2012  . Stroke ([place])   . TBI (traumatic brain injury) ([place]) 09/30/2012  . Tubular adenoma   . Zygoma fracture ([place]) 09/21/2012    Family History: Father's history unknown. Mother deceased from a heart attack. Sister with breast cancer.  Social History: [place], [place]. Patient is a never smoker, never used alcohol.  Review of Systems: A complete ROS was negative except as per HPI.   Physical Exam: Blood pressure (!) 103/32, pulse (!) 54, temperature (S) (!) 102.3 F (39.1 C), temperature source Oral, resp. rate 20, SpO2 98 %. Constitutional: Frail, ill appearing lady in NAD HEENT: Atraumatic, normocephalic. PERRL, anicteric sclera. Left temple area with healing biopsy site  Neck: Supple, trachea midline.  Cardiovascular: Bradycardic and irregular, distant heart sounds \ Pulmonary/Chest: Bilateral crackles, course bronchial breath sounds, scattered expiratory wheezing   Abdominal: Soft, non tender, non distended. +BS.  Extremities: Left BKA, RLE in leg splint   Neurological: A&Ox3, CN II - XII grossly intact.  Skin: No rashes or erythema  Psychiatric: Normal mood and affect  EKG: None   CXR: Personally reviewed, agree with report. Right pleural effusion. Bilateral interstitial markings. No consolidation.   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   Influenza B Pneumonia: Patient presented with productive cough, congestion, and fevers up to 103. She has a history of renal transplant (2001) and is immunosuppressed on prograf, mycophenolate, and chronic prednisone 5 mg daily. Flu panel resulted positive for influenza B on admission. Recent sick contacts include a granddaughter with the flu. Chest x-ray showed near complete resolution of her prior left pleural effusion (s/p 3 thoracentesis in March), small right pleural effusion, and right basilar atelectasis. No evidence of consolidation. S/p 1 L NS, IV Levaquin x 1, and Tamiflu in the ED. Lactic acid improved 2.02 -> 0.95.  -- Continue tamiflu, renally dosed x 5 days  -- Hold off on further antibiotics for now, low threshold to resume if patient decompensates or does not improve clinically  -- Supplemental oxygen prn   -- Droplet precautions  -- F/u blood cultures (drawn in the ED after abx dose)   Hx Renal  Transplant: Creatinine elevated to 1.9 on admission. Unclear baseline, last creatinine in our system was 1.2 two years ago. S/p 1 L NS in the ED.  -- 75 cc NS  -- Caution with fluids in the setting of her diastolic HF and pleural effusion  -- Continue home prograf, mycophenolate, and prednisone 5 mg daily  -- Repeat AM BMP   Diastolic Heart Failure: Likely ischemic CAD. Currently well compensated. Previously noted left pleural effusion has nearly resolved on CXR, small right pleural effusion. Sating well on room air.  -- Hold home torsemide   Type II DM: Takes Lantus 8 units and SSI with meals at home.  -- 6 units Lantus QHS -- S-SSI TID w/ meals   Hx Paroxsymal Atrial Fibrillation: Not currently on any rate or rhythm control medications. Bradycardic here on admission.  -- Continue home warfarin   Hx Iron Deficiency Anemia: -- Continue home  iron supplements  Hx Hypothyroidism: S/p thyroidectomy  -- Continue home Synthroid 112 mcg   Hx Home Orthostasis/hypotension: -- Hold home midodrine  -- Monitor vitals   FEN: 75 cc/hr, replete lytes prn, heart healthy carb mod VTE ppx: Warfarin  Code Status: DNR   Dispo: Admit patient to Inpatient with expected length of stay greater than 2 midnights.  Signed: Velna Ochs, MD 11/22/2016, 12:42 PM  Pager: [phone]

## 2016-11-22 NOTE — ED Notes (Signed)
Family at bedside. 

## 2016-11-22 NOTE — ED Notes (Signed)
Patient placed on 2L O2 per dr Kathleen Lime request for pts O2 sat of 93% on room air

## 2016-11-22 NOTE — Progress Notes (Signed)
11/22/2016 2:17 PM  Report Received. Room is ready for the patient.   Elizabeth [name] [employer], RN-BC, [employer] Phone [phone]

## 2016-11-22 NOTE — ED Notes (Signed)
Pt requesting to take morning meds she brought from home, Dr. Darl Householder made aware and advised this RN it was appropriate for pt to take her home meds at this time.

## 2016-11-22 NOTE — ED Provider Notes (Signed)
[facility] DEPT Provider Note   CSN: [id number] Arrival date & time: 11/22/16  0859     History   Chief Complaint Chief Complaint  Patient presents with  . Fever  . Emesis    HPI Elizabeth [name] is a 76 y.o. female  Hx of CVA, CAD, renal transplant on prograf, tacrolimus, CHF with pleural effusions, Here presenting with cough, fever, vomiting. Patient has been vomiting for the last several days. Also has some productive cough as well. Patient has some epigastric pain associated with coughing. This morning, patient felt warm and a temperature 103 at home. Also seemed to have some trouble breathing as well. Patient did have renal transplant many years ago and is still taking Prograf, tacrolimus. Patient still urinates normally. Patient came by EMS and was given 4 mg Zofran, Tylenol but she vomited up the Tylenol. She states home all the time and denies any sick contacts but her granddaughter recently was diagnosed with flu.  The history is provided by the patient.    Past Medical History:  Diagnosis Date  . Adenomatous polyp   . Anemia   . 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 Bradycardia   . Carotid artery stenosis    mild  . Carotid bruit    left  . Chronic kidney disease    s/p cadaveric renal transplant in 2001  . Colitis   . Compression fracture of L4 lumbar vertebra (HCC)   . Coronary artery disease   . CVA (cerebral infarction)   . Diabetes mellitus   . Diabetic retinopathy   . Esophageal candidiasis ([place])   . Fracture of right maxilla 09/21/2012  . Fx ankle   . Fx wrist   . Gastritis   . GERD (gastroesophageal reflux disease)   . H/O immunosuppressive therapy   . History of orthostatic hypotension   . Hyperlipidemia   . Hyperparathyroidism   . Hypothyroidism   . IBS (irritable bowel syndrome)   . Ischemic colitis ([place])   . Lactose intolerance   . Leg ulcer  ([place])    from poor fitting prosthetic  . Melanoma ([place])   . Nephropathy   . 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 Neuropathy ([place])   . Osteoporosis    recurrent fractures  . Pancreatic cyst   . Pulmonary embolism ([place])   . PVD (peripheral vascular disease) ([place])   . Renal artery stenosis ([place])   . 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 Renal osteodystrophy   . Right orbit fracture ([place]) 09/21/2012  . Stroke ([place])   . TBI (traumatic brain injury) ([employer]) 09/30/2012  . Tubular adenoma   . Zygoma fracture ([place]) 09/21/2012    Patient Active Problem List   Diagnosis Date Noted  . Pleural effusion due to congestive heart failure ([place]) 09/30/2016  . Hyperlipidemia 06/18/2014  . Altered mental status 11/19/2013  . Acute encephalopathy 11/19/2013  . Acute diastolic CHF (congestive heart failure) ([place]) 10/21/2013  . H/O kidney transplant 10/21/2013  . Encounter for therapeutic drug monitoring 09/17/2013  . UTI (lower urinary tract infection) 09/27/2012  . Concussion, unspecified 09/21/2012  . 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 Orthostatic hypotension 06/06/2012  . 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 Constipation 10/11/2011  . Nocturnal polyuria 03/31/2011  . Atrial fibrillation ([place]) 01/28/2011  . Encounter for anticoagulation discussion and counseling 01/28/2011  . CVA (cerebral vascular accident) ([place])  01/28/2011  . DVT of upper extremity (deep vein thrombosis) ([place]) 01/28/2011  . Edema 01/10/2011  . SINUS BRADYCARDIA 12/14/2009  . GERD 12/08/2009  . DIZZINESS 05/20/2009  . Diabetes type 2, controlled ([place]) 02/11/2009  . HYPERTENSION, BENIGN 02/09/2009  . CAD, NATIVE VESSEL 02/09/2009  . CAROTID BRUITS, BILATERAL 02/09/2009  . 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 Irritable bowel syndrome 08/18/2008  . COLONIC POLYPS, HX OF 08/18/2008    Past Surgical History:  Procedure Laterality  Date  . ABDOMINAL HYSTERECTOMY    . APPENDECTOMY    . BELOW KNEE LEG AMPUTATION     left, charcott joint from neuropathy  . CATARACT EXTRACTION    . CESAREAN SECTION     x 2  . CHOLECYSTECTOMY    . COLONOSCOPY  01/30/2012   Procedure: COLONOSCOPY;  Surgeon: Milus Banister, MD;  Location: [facility];  Service: Endoscopy;  Laterality: N/A;  . COLONOSCOPY W/ BIOPSIES AND POLYPECTOMY  02/18/2009   adenomatous polyps, diverticulosis, internal hemorrhoids  . CORONARY ARTERY BYPASS GRAFT    . ESOPHAGOGASTRODUODENOSCOPY  05/02/2012   Procedure: ESOPHAGOGASTRODUODENOSCOPY (EGD);  Surgeon: Gatha Mayer, MD;  Location: [facility];  Service: Endoscopy;  Laterality: N/A;  . EUS  02/04/2010   w/FNA, pancreatic cyst  . KIDNEY TRANSPLANT  08/15/2000   [facility] in [place]  . THORACENTESIS  07/2016, 08/2016, 09/2016   Ordered by Dr. Vella Kohler. Surg: Dr.Green, Dr. Anselm Pancoast  . THYROID SURGERY    . TONSILLECTOMY    . UPPER GASTROINTESTINAL ENDOSCOPY  12/01/2009  . VITRECTOMY     right eye    OB History    No data available       Home Medications    Prior to Admission medications   Medication Sig Start Date End Date Taking? Authorizing Provider  acetaminophen (TYLENOL) 500 MG tablet Take 500 mg by mouth every 6 (six) hours as needed.    Historical Provider, MD  atorvastatin (LIPITOR) 10 MG tablet Take 1 tablet (10 mg total) by mouth every other day. 10/12/12   Lavon Paganini Angiulli, PA-C  cholecalciferol (VITAMIN D) 1000 UNITS tablet Take 2,000 Units by mouth daily.     Historical Provider, MD  docusate sodium (COLACE) 100 MG capsule Take 100 mg by mouth daily as needed.     Historical Provider, MD  escitalopram (LEXAPRO) 10 MG tablet Take 10 mg by mouth daily.    Historical Provider, MD  ferrous sulfate (SM IRON) 325 (65 FE) MG tablet Take 65 mg by mouth daily.     Historical Provider, MD  HYDROcodone-acetaminophen (NORCO/VICODIN) 5-325 MG per tablet Take 2 tablets by mouth 2 (two) times daily  as needed for moderate pain.     Historical Provider, MD  hyoscyamine (ANASPAZ) 0.125 MG TBDP disintergrating tablet Place 0.125 mg under the tongue as needed for cramping.     Historical Provider, MD  insulin glargine (LANTUS) 100 UNIT/ML injection Inject 9 Units into the skin every morning.  10/12/12   Lavon Paganini Angiulli, PA-C  insulin lispro (HUMALOG) 100 UNIT/ML injection Inject 2-9 Units into the skin 4 (four) times daily -  before meals and at bedtime. Per sliding scale.    Historical Provider, MD  levothyroxine (SYNTHROID, LEVOTHROID) 112 MCG tablet Take 112 mcg by mouth daily before breakfast.    Historical Provider, MD  magnesium gluconate (MAGONATE) 500 MG tablet Take 500 mg by mouth 2 (two) times daily.    Historical Provider, MD  midodrine (PROAMATINE) 10 MG tablet Take 1 tablet (10 mg total) by mouth 3 (three) times daily as needed. 06/18/14   Minna Merritts, MD  mirtazapine (REMERON) 7.5 MG tablet Take 7.5 mg by mouth at bedtime.  02/15/13   Historical Provider, MD  Multiple Vitamin (MULTIVITAMIN WITH MINERALS) TABS Take 1 tablet by mouth daily.    Historical Provider, MD  mycophenolate (MYFORTIC) 360 MG TBEC Take 360 mg by mouth 2 (two) times daily.     Historical Provider, MD  nitroGLYCERIN (NITROSTAT) 0.4 MG SL tablet Place 1 tablet (0.4 mg total) under the tongue every 5 (five) minutes as needed for chest pain. 11/02/12   Minna Merritts, MD  ondansetron (ZOFRAN) 4 MG tablet Take 4 mg by mouth as needed.     Historical Provider, MD  potassium chloride (K-DUR) 10 MEQ tablet Take 10 mEq by mouth daily at 12 noon.     Historical Provider, MD  predniSONE (DELTASONE) 5 MG tablet Take 5 mg by mouth every morning.     Historical Provider, MD  promethazine (PHENERGAN) 25 MG tablet Take 1 tablet (25 mg total) by mouth every 6 (six) hours as needed for nausea or vomiting. 11/02/16   Minna Merritts, MD  ranitidine (ZANTAC) 150 MG tablet Take 150 mg by mouth at bedtime.    Historical Provider,  MD  tacrolimus (PROGRAF) 0.5 MG capsule Take 1-2 mg by mouth 2 (two) times daily. Take 2mg  in the am & 1mg  at night    Historical Provider, MD  torsemide (DEMADEX) 20 MG tablet TAKE ONE TABLET BY MOUTH TWICE DAILY AS NEEDED 05/23/16   Minna Merritts, MD  warfarin (COUMADIN) 2.5 MG tablet TAKE AS DIRECTED BY COUMADIN CLINIC 11/11/16   Minna Merritts, MD    Family History Family History  Problem Relation Age of Onset  . Stroke Mother 33  . Heart attack Father 5  . Colon cancer Neg Hx   . Diabetes Neg Hx     Social History Social History  Substance Use Topics  . Smoking status: Never Smoker  . Smokeless tobacco: Never Used  . Alcohol use No     Allergies   Azithromycin; Erythromycin; Fentanyl; and Percocet [oxycodone-acetaminophen]   Review of Systems Review of Systems  Constitutional: Positive for fever.  Respiratory: Positive for cough.   Gastrointestinal: Positive for vomiting.  All other systems reviewed and are negative.    Physical Exam Updated Vital Signs BP (!) 113/30   Pulse (!) 59   Temp (S) (!) 102.3 F (39.1 C) (Oral)   Resp 18   SpO2 95%   Physical Exam  Constitutional: She is oriented to person, place, and time.  Chronically ill, uncomfortable, dehydrated   HENT:  Head: Normocephalic.  MM dry   Eyes: EOM are normal. Pupils are equal, round, and reactive to light.  Neck: Normal range of motion. Neck supple.  Cardiovascular: Normal rate, regular rhythm and normal heart sounds.   Pulmonary/Chest:  tachypneic, crackles bilaterally   Abdominal: Soft. Bowel sounds are normal.  Mild epigastric tenderness, no rebound or guarding   Musculoskeletal:  L BKA, R lower leg splint   Neurological: She is alert and oriented to person, place, and time.  Skin: Skin is warm.  Psychiatric: She has a normal mood and affect.  Nursing note and vitals reviewed.    ED Treatments / Results  Labs (all labs ordered are listed, but only abnormal results are  displayed) Labs Reviewed  CBC WITH  DIFFERENTIAL/PLATELET - Abnormal; Notable for the following:       Result Value   RBC 3.26 (*)    Hemoglobin 10.0 (*)    HCT 30.8 (*)    RDW 16.2 (*)    Platelets 141 (*)    Lymphs Abs 0.5 (*)    All other components within normal limits  COMPREHENSIVE METABOLIC PANEL - Abnormal; Notable for the following:    Chloride 96 (*)    Glucose, Bld 218 (*)    BUN 57 (*)    Creatinine, Ser 1.91 (*)    Calcium 8.8 (*)    Total Protein 5.3 (*)    Albumin 3.1 (*)    GFR calc non Af Amer 24 (*)    GFR calc Af Amer 28 (*)    All other components within normal limits  PROTIME-INR - Abnormal; Notable for the following:    Prothrombin Time 22.5 (*)    All other components within normal limits  I-STAT CG4 LACTIC ACID, ED - Abnormal; Notable for the following:    Lactic Acid, Venous 2.02 (*)    All other components within normal limits  URINE CULTURE  LIPASE, BLOOD  URINALYSIS, ROUTINE W REFLEX MICROSCOPIC  INFLUENZA PANEL BY PCR (TYPE A & B)  TACROLIMUS LEVEL  I-STAT TROPOININ, ED    EKG  EKG Interpretation None       Radiology Dg Chest 2 View  Result Date: 11/22/2016 CLINICAL DATA:  Cough and fever.  Renal transplant patient. EXAM: CHEST  2 VIEW COMPARISON:  09/23/2016 FINDINGS: The heart size and mediastinal contours are within normal limits. Aortic atherosclerosis. Prior CABG. Near complete resolution of left pleural effusion since previous study. Stable small subpulmonic right pleural effusion with associated compressive atelectasis in right lung base. No evidence of pulmonary edema or consolidation. IMPRESSION: No significant change in small subpulmonic right pleural effusion and right basilar atelectasis. Near complete resolution of left pleural effusion since prior study. Electronically Signed   By: Earle Gell M.D.   On: 11/22/2016 09:48    Procedures Procedures (including critical care time)  Medications Ordered in ED Medications    levofloxacin (LEVAQUIN) IVPB 750 mg (750 mg Intravenous New Bag/Given 11/22/16 1100)  oseltamivir (TAMIFLU) capsule 75 mg (75 mg Oral Given 11/22/16 1120)  sodium chloride 0.9 % bolus 1,000 mL (0 mLs Intravenous Stopped 11/22/16 1119)  ondansetron (ZOFRAN) injection 4 mg (4 mg Intravenous Given 11/22/16 0920)  acetaminophen (TYLENOL) tablet 650 mg (650 mg Oral Given 11/22/16 7371)     Initial Impression / Assessment and Plan / ED Course  I have reviewed the triage vital signs and the nursing notes.  Pertinent labs & imaging results that were available during my care of the patient were reviewed by me and considered in my medical decision making (see chart for details).     Elizabeth [name] is a 76 y.o. female here with cough, fever. Consider pneumonia vs flu. Febrile 102 in the ED. Patient has hx of CHF and getting lasix at home so will avoid too much IVF. Borderline O2 92-93% on RA and placed on 2 L. Will do sepsis workup and swab for flu.   12:35 PM WBC 10. Lactate 2.2 initially, improved with IVF. CXR showed bilateral effusion with R base atelectasis. Given levaquin. Flu ordered, started on tamiflu empirically. Internal medicine teaching service to admit.   Final Clinical Impressions(s) / ED Diagnoses   Final diagnoses:  None    New Prescriptions New Prescriptions  No medications on file     Drenda Freeze, MD 11/22/16 1235

## 2016-11-22 NOTE — Progress Notes (Signed)
ANTICOAGULATION CONSULT NOTE - Initial Consult  Pharmacy Consult for warfarin Indication: atrial fibrillation  Allergies  Allergen Reactions  . Azithromycin Other (See Comments)    Upset stomach  . Erythromycin Nausea And Vomiting  . Fentanyl Nausea And Vomiting  . Percocet [Oxycodone-Acetaminophen] Nausea And Vomiting    Patient Measurements:    Vital Signs: Temp: 99.5 F (37.5 C) (04/03 1250) Temp Source: Oral (04/03 1250) BP: 111/37 (04/03 1415) Pulse Rate: 59 (04/03 1415)  Labs:  Recent Labs  11/22/16 0913  HGB 10.0*  HCT 30.8*  PLT 141*  LABPROT 22.5*  INR 1.95  CREATININE 1.91*    CrCl cannot be calculated (Unknown ideal weight.).   Assessment: Elizabeth [name] here with URI- started on Tamiflu, received 1 dose of levofloxacin in the ED. Patient with history of kidney transplant on mycophenolate and tacrolimus. On warfarin PTA for history of AFib- home dose 2.5mg  daily except 5mg  on Sun, Tues and Thurs. INR this morning 1.95- patient last took warfarin 4/2 in the evening. Hgb 10, plts 141- no bleeding noted.  Goal of Therapy:  INR 2-3 Monitor platelets by anticoagulation protocol: Yes   Plan:  -warfarin 5mg  po x1 tonight as per home dose -daily INR -follow for s/s bleeding  Masiah Woody D. Halayna Blane, PharmD, BCPS Clinical Pharmacist Pager: [phone] 11/22/2016 3:47 PM

## 2016-11-22 NOTE — ED Triage Notes (Addendum)
Pt arrives via [facility] from home, ems reports pts husband called out for pt having a fever of 103 and abdominal pain that began this am, pt is vomiting but has hx of chronic nausea/vomiting, hx of kidney transplant 16 years ago. Pt a/ox4, resp e/u. Pt received 4mg  zofran pta, ems administered 500mg  of tylenol but pt was unable to keep it down.

## 2016-11-23 ENCOUNTER — Ambulatory Visit
Admission: RE | Admit: 2016-11-23 | Discharge: 2016-11-23 | Disposition: A | Discharge status: Home / Self Care | Payer: Medicare Other | Source: Ambulatory Visit | Attending: Specialist | Admitting: Specialist

## 2016-11-23 DIAGNOSIS — J1000 Influenza due to other identified influenza virus with unspecified type of pneumonia: Secondary | ICD-10-CM

## 2016-11-23 DIAGNOSIS — N184 Chronic kidney disease, stage 4 (severe): Secondary | ICD-10-CM | POA: Diagnosis present

## 2016-11-23 DIAGNOSIS — Z66 Do not resuscitate: Secondary | ICD-10-CM

## 2016-11-23 DIAGNOSIS — Z7901 Long term (current) use of anticoagulants: Secondary | ICD-10-CM

## 2016-11-23 DIAGNOSIS — Z8249 Family history of ischemic heart disease and other diseases of the circulatory system: Secondary | ICD-10-CM

## 2016-11-23 DIAGNOSIS — I5030 Unspecified diastolic (congestive) heart failure: Secondary | ICD-10-CM

## 2016-11-23 DIAGNOSIS — I480 Paroxysmal atrial fibrillation: Secondary | ICD-10-CM

## 2016-11-23 DIAGNOSIS — Z951 Presence of aortocoronary bypass graft: Secondary | ICD-10-CM

## 2016-11-23 DIAGNOSIS — Z89512 Acquired absence of left leg below knee: Secondary | ICD-10-CM

## 2016-11-23 DIAGNOSIS — J1100 Influenza due to unidentified influenza virus with unspecified type of pneumonia: Secondary | ICD-10-CM | POA: Diagnosis present

## 2016-11-23 DIAGNOSIS — D509 Iron deficiency anemia, unspecified: Secondary | ICD-10-CM

## 2016-11-23 DIAGNOSIS — J189 Pneumonia, unspecified organism: Secondary | ICD-10-CM | POA: Diagnosis present

## 2016-11-23 DIAGNOSIS — J181 Lobar pneumonia, unspecified organism: Secondary | ICD-10-CM

## 2016-11-23 DIAGNOSIS — I2510 Atherosclerotic heart disease of native coronary artery without angina pectoris: Secondary | ICD-10-CM

## 2016-11-23 DIAGNOSIS — J101 Influenza due to other identified influenza virus with other respiratory manifestations: Secondary | ICD-10-CM | POA: Diagnosis present

## 2016-11-23 DIAGNOSIS — E785 Hyperlipidemia, unspecified: Secondary | ICD-10-CM

## 2016-11-23 DIAGNOSIS — Z79899 Other long term (current) drug therapy: Secondary | ICD-10-CM

## 2016-11-23 DIAGNOSIS — I951 Orthostatic hypotension: Secondary | ICD-10-CM

## 2016-11-23 DIAGNOSIS — I110 Hypertensive heart disease with heart failure: Secondary | ICD-10-CM

## 2016-11-23 DIAGNOSIS — Z8673 Personal history of transient ischemic attack (TIA), and cerebral infarction without residual deficits: Secondary | ICD-10-CM

## 2016-11-23 DIAGNOSIS — Z86718 Personal history of other venous thrombosis and embolism: Secondary | ICD-10-CM

## 2016-11-23 DIAGNOSIS — E119 Type 2 diabetes mellitus without complications: Secondary | ICD-10-CM

## 2016-11-23 DIAGNOSIS — Z803 Family history of malignant neoplasm of breast: Secondary | ICD-10-CM

## 2016-11-23 DIAGNOSIS — E039 Hypothyroidism, unspecified: Secondary | ICD-10-CM

## 2016-11-23 LAB — CBC
HEMATOCRIT: 30.4 % — AB (ref 36.0–46.0)
Hemoglobin: 9.4 g/dL — ABNORMAL LOW (ref 12.0–15.0)
MCH: 29.6 pg (ref 26.0–34.0)
MCHC: 30.9 g/dL (ref 30.0–36.0)
MCV: 95.6 fL (ref 78.0–100.0)
Platelets: 113 10*3/uL — ABNORMAL LOW (ref 150–400)
RBC: 3.18 MIL/uL — ABNORMAL LOW (ref 3.87–5.11)
RDW: 16.4 % — AB (ref 11.5–15.5)
WBC: 3.6 10*3/uL — AB (ref 4.0–10.5)

## 2016-11-23 LAB — COMPREHENSIVE METABOLIC PANEL
ALT: 15 U/L (ref 14–54)
AST: 22 U/L (ref 15–41)
Albumin: 2.5 g/dL — ABNORMAL LOW (ref 3.5–5.0)
Alkaline Phosphatase: 42 U/L (ref 38–126)
Anion gap: 9 (ref 5–15)
BILIRUBIN TOTAL: 0.7 mg/dL (ref 0.3–1.2)
BUN: 54 mg/dL — AB (ref 6–20)
CO2: 29 mmol/L (ref 22–32)
CREATININE: 1.83 mg/dL — AB (ref 0.44–1.00)
Calcium: 8.4 mg/dL — ABNORMAL LOW (ref 8.9–10.3)
Chloride: 101 mmol/L (ref 101–111)
GFR calc Af Amer: 30 mL/min — ABNORMAL LOW (ref >60)
GFR, EST NON AFRICAN AMERICAN: 25 mL/min — AB (ref >60)
Glucose, Bld: 262 mg/dL — ABNORMAL HIGH (ref 65–99)
Potassium: 4.4 mmol/L (ref 3.5–5.1)
Sodium: 139 mmol/L (ref 135–145)
TOTAL PROTEIN: 4.7 g/dL — AB (ref 6.5–8.1)

## 2016-11-23 LAB — URINALYSIS, ROUTINE W REFLEX MICROSCOPIC
Bilirubin Urine: NEGATIVE
Glucose, UA: NEGATIVE mg/dL
Hgb urine dipstick: NEGATIVE
Ketones, ur: NEGATIVE mg/dL
Leukocytes, UA: NEGATIVE
NITRITE: NEGATIVE
Protein, ur: NEGATIVE mg/dL
SPECIFIC GRAVITY, URINE: 1.016 (ref 1.005–1.030)
pH: 5 (ref 5.0–8.0)

## 2016-11-23 LAB — PROTIME-INR
INR: 1.98
PROTHROMBIN TIME: 22.8 s — AB (ref 11.4–15.2)

## 2016-11-23 LAB — GLUCOSE, CAPILLARY
Glucose-Capillary: 240 mg/dL — ABNORMAL HIGH (ref 65–99)
Glucose-Capillary: 251 mg/dL — ABNORMAL HIGH (ref 65–99)
Glucose-Capillary: 63 mg/dL — ABNORMAL LOW (ref 65–99)
Glucose-Capillary: 76 mg/dL (ref 65–99)
Glucose-Capillary: 167 mg/dL — ABNORMAL HIGH (ref 65–99)

## 2016-11-23 LAB — APTT: aPTT: 47 seconds — ABNORMAL HIGH (ref 24–36)

## 2016-11-23 LAB — TACROLIMUS LEVEL: TACROLIMUS (FK506) - LABCORP: 4.9 ng/mL (ref 2.0–20.0)

## 2016-11-23 MED ORDER — INSULIN ASPART 100 UNIT/ML ~~LOC~~ SOLN
0.0000 [IU] | Freq: Every day | SUBCUTANEOUS | Status: DC
  Administered 2016-11-26 – 2016-11-28 (×2): 2 [IU] via SUBCUTANEOUS
  Administered 2016-12-01: 3 [IU] via SUBCUTANEOUS

## 2016-11-23 MED ORDER — ORAL CARE MOUTH RINSE
15.0000 mL | Freq: Two times a day (BID) | OROMUCOSAL | Status: DC
  Administered 2016-11-23 – 2016-12-01 (×15): 15 mL via OROMUCOSAL

## 2016-11-23 MED ORDER — GLUCERNA SHAKE PO LIQD
237.0000 mL | Freq: Three times a day (TID) | ORAL | Status: DC
  Administered 2016-11-23: 1 via ORAL
  Administered 2016-11-24 – 2016-12-01 (×12): 237 mL via ORAL

## 2016-11-23 MED ORDER — LEVOFLOXACIN IN D5W 500 MG/100ML IV SOLN
500.0000 mg | INTRAVENOUS | Status: DC
  Filled 2016-11-23: qty 100

## 2016-11-23 MED ORDER — INSULIN ASPART 100 UNIT/ML ~~LOC~~ SOLN
0.0000 [IU] | Freq: Three times a day (TID) | SUBCUTANEOUS | Status: DC
  Administered 2016-11-24 (×2): 2 [IU] via SUBCUTANEOUS
  Administered 2016-11-25 – 2016-11-26 (×3): 3 [IU] via SUBCUTANEOUS
  Administered 2016-11-29 (×2): 2 [IU] via SUBCUTANEOUS
  Administered 2016-11-30 (×2): 3 [IU] via SUBCUTANEOUS
  Administered 2016-11-30: 2 [IU] via SUBCUTANEOUS
  Administered 2016-12-01: 1 [IU] via SUBCUTANEOUS
  Administered 2016-12-01: 2 [IU] via SUBCUTANEOUS
  Administered 2016-12-01: 1 [IU] via SUBCUTANEOUS
  Administered 2016-12-02: 3 [IU] via SUBCUTANEOUS
  Administered 2016-12-02: 1 [IU] via SUBCUTANEOUS
  Administered 2016-12-02: 2 [IU] via SUBCUTANEOUS

## 2016-11-23 MED ORDER — WARFARIN SODIUM 5 MG PO TABS
5.0000 mg | ORAL_TABLET | Freq: Once | ORAL | Status: AC
  Administered 2016-11-23: 5 mg via ORAL
  Filled 2016-11-23: qty 1

## 2016-11-23 MED ORDER — SODIUM CHLORIDE 0.9 % IV SOLN
INTRAVENOUS | Status: DC
  Administered 2016-11-23 (×2): via INTRAVENOUS

## 2016-11-23 MED ORDER — INSULIN GLARGINE 100 UNIT/ML ~~LOC~~ SOLN
8.0000 [IU] | Freq: Every day | SUBCUTANEOUS | Status: DC
  Administered 2016-11-23 – 2016-11-27 (×5): 8 [IU] via SUBCUTANEOUS
  Filled 2016-11-23 (×6): qty 0.08

## 2016-11-23 NOTE — Progress Notes (Signed)
Initial Nutrition Assessment  DOCUMENTATION CODES:   Not applicable  INTERVENTION:  Discontinue Ensure.   Provide Glucerna Shake po TID, each supplement provides 220 kcal and 10 grams of protein  Encourage adequate PO intake.  NUTRITION DIAGNOSIS:   Inadequate oral intake related to nausea (abdominal pains) as evidenced by per patient/family report.  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   Meal completion has been 10-100% with 10% at lunch today. She reports abdominal pains during time of visit. Pt reports eating well PTA with usual consumption of at least 3 meals a day with no other difficulties. Weight has been stable. Pt currently has Ensure ordered. Noted CBG 240-262 mg/dL most recently, thus RD to modify orders and order Glucerna Shake instead to help better manage blood glucose.   Pt with no observed significant fat or muscle mass loss.   Labs and medications reviewed.   Diet Order:  Diet heart healthy/carb modified Room service appropriate? Yes; Fluid consistency: Thin  Skin:  Reviewed, no issues  Last BM:  4/3  Height:   Ht Readings from Last 1 Encounters:  11/02/16 5' 3.5" (1.613 m)    Weight:   Wt Readings from Last 1 Encounters:  11/23/16 132 lb 0.9 oz (59.9 kg)    Ideal Body Weight:  50.2 kg (adjusted for R BKA)  BMI:  Body mass index is 23.03 kg/m.  Estimated Nutritional Needs:   Kcal:   1650-1850  Protein:  70-80 grams  Fluid:  1.6 - 1.8 L/day  EDUCATION NEEDS:   No education needs identified at this time  Corrin Parker, MS, RD, LDN Pager # [phone] After hours/ weekend pager # [phone]

## 2016-11-23 NOTE — Progress Notes (Signed)
Subjective: Patient feels poorly this morning. Diffuse pain in her neck and back. + cough and SOB. Increased work of breathing on [place].   Objective:  Vital signs in last 24 hours: Vitals:   11/22/16 2051 11/23/16 0449 11/23/16 0800 11/23/16 0900  BP: (!) 136/24 (!) 102/20  (!) 88/64  Pulse: 63 (!) 58  91  Resp: 16 16  18   Temp: 99.4 F (37.4 C) 98.3 F (36.8 C)  97.7 F (36.5 C)  TempSrc:    Oral  SpO2: 100% 91%  94%  Weight:   132 lb 0.9 oz (59.9 kg)    Physical Exam: Constitutional: Frail, ill appearing lady in NAD HEENT: Atraumatic, normocephalic. PERRL, anicteric sclera. Left temple area with healing biopsy site  Neck: Supple, trachea midline.  Cardiovascular: RRR, distant heart sounds  Pulmonary/Chest: Bilateral crackles, course bronchial breath sounds, scattered expiratory wheezing. Increased work of breathing on 3L [place].  Abdominal: Soft, non tender, non distended. +BS.  Extremities: Left BKA, Right lower extremity with pulses intact, warm and well perfused, no edema  Neurological: A&Ox3, CN II - XII grossly intact.  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   Influenza B Pneumonia: Patient presented with productive cough, congestion, and fevers up to 103. She has a history of renal transplant (2001) and is immunosuppressed on prograf, mycophenolate, and chronic prednisone 5 mg daily. Flu panel resulted positive for influenza B on admission. Recent sick contacts include a granddaughter with the flu. Chest x-ray showed near complete resolution of  her prior left pleural effusion (s/p 3 thoracentesis in March), small right pleural effusion, and right basilar atelectasis. No evidence of consolidation. No clinic improvement thus far with tamiflu. Levaquin was initially held on admission due to low suspicion for bacterial PNA. Given her lack of progress clinically and immunosuppression, will resume today.  -- Continue tamiflu, renally dosed (day 2/5)   -- Resume Levaquin  -- Supplemental oxygen prn   -- Droplet precautions  -- F/u blood cultures (drawn in the ED after abx dose) >> no growth x 24 hours   Hx Renal Transplant: Creatinine elevated to 1.9 on admission, unchanged today. Unclear baseline, last creatinine in our system was 1.2 two years ago. S/p 1 L NS in the ED.  -- 75 cc NS  -- Caution with fluids in the setting of her diastolic HF and pleural effusion  -- Continue home prograf, mycophenolate, and prednisone 5 mg daily  -- Repeat AM BMP   Diastolic Heart Failure: Likely ischemic CAD. Currently well compensated. Previously noted left pleural effusion has nearly resolved on CXR, small right pleural effusion. Sating well on room air.  -- Hold home torsemide   Type II DM: Takes Lantus 8 units and SSI with meals at home. Started on 6 units QHS and S-SSI on admission. CBG have been uncontrolled > 200. Will increase regimen.  -- 8 units Lantus QHS -- S-SSI TID w/ meals + HS scale   Hx Paroxsymal Atrial Fibrillation: Not currently on any rate or rhythm control medications. Bradycardic here on admission.  --  Continue home warfarin   Hx Iron Deficiency Anemia: -- Continue home iron supplements  Hx Hypothyroidism: S/p thyroidectomy  -- Continue home Synthroid 112 mcg   Hx Home Orthostasis/hypotension: -- Hold home midodrine  -- Monitor vitals   FEN: 75 cc/hr, replete lytes prn, heart healthy carb mod VTE ppx: Warfarin  Code Status: DNR   Dispo: Anticipated discharge pending improvement in clinical status.   Velna Ochs, MD 11/23/2016, 11:36 AM Pager: [phone]

## 2016-11-23 NOTE — Progress Notes (Signed)
[place] for warfarin Indication: atrial fibrillation  Allergies  Allergen Reactions  . Azithromycin Other (See Comments)    Upset stomach  . Erythromycin Nausea And Vomiting  . Fentanyl Nausea And Vomiting  . Percocet [Oxycodone-Acetaminophen] Nausea And Vomiting    Patient Measurements: Weight: 132 lb 0.9 oz (59.9 kg)  Vital Signs: Temp: 98.3 F (36.8 C) (04/04 0449) BP: 102/20 (04/04 0449) Pulse Rate: 58 (04/04 0449)  Labs:  Recent Labs  11/22/16 0913 11/23/16 0643  HGB 10.0* 9.4*  HCT 30.8* 30.4*  PLT 141* PENDING  APTT  --  47*  LABPROT 22.5* 22.8*  INR 1.95 1.98  CREATININE 1.91* 1.83*    Estimated Creatinine Clearance: 21.8 mL/min (A) (by C-G formula based on SCr of 1.83 mg/dL (H)).   Assessment: 56 YOF here with URI- started on Tamiflu and also continued on levofloxacin. Patient with history of kidney transplant on mycophenolate and tacrolimus. On warfarin PTA for history of AFib- home dose 2.5mg  daily except 5mg  on Sun, Tues and Thur- patient last took warfarin 4/2 PTA.  INR 1.98. Hgb 9.4, platelets still pending this morning- no bleeding noted.  Goal of Therapy:  INR 2-3 Monitor platelets by anticoagulation protocol: Yes   Plan:  -warfarin 5mg  po x1 tonight -daily INR -follow for s/s bleeding -watch LOT of levofloxacin as it can potentiate INR  Laurajean Hosek D. Aleta Manternach, PharmD, BCPS Clinical Pharmacist Pager: [phone] 11/23/2016 8:30 AM

## 2016-11-23 NOTE — Progress Notes (Signed)
Pharmacy Antibiotic Note  Elizabeth [name] is a 76 y.o. female admitted on 11/22/2016 with pneumonia.  Pharmacy has been consulted for levofloxacin dosing.  Plan: -Levofloxacin 500mg  IV q48h starting 4/5 as patient received a dose of levofloxacin 750mg  IV on 4/3 in the ED -Follow renal function, clinical progression, length of therapy  Weight: 132 lb 0.9 oz (59.9 kg)  Temp (24hrs), Avg:99.6 F (37.6 C), Min:98.3 F (36.8 C), Max:102.3 F (39.1 C)   Recent Labs Lab 11/22/16 0913 11/22/16 0924 11/22/16 1208 11/23/16 0643  WBC 4.7  --   --  3.6*  CREATININE 1.91*  --   --  1.83*  LATICACIDVEN  --  2.02* 0.95  --     Estimated Creatinine Clearance: 21.8 mL/min (A) (by C-G formula based on SCr of 1.83 mg/dL (H)).    Allergies  Allergen Reactions  . Azithromycin Other (See Comments)    Upset stomach  . Erythromycin Nausea And Vomiting  . Fentanyl Nausea And Vomiting  . Percocet [Oxycodone-Acetaminophen] Nausea And Vomiting    Antimicrobials this admission: Levofloxacin 4/3>> Tamiflu 4/3>>(4/8)  Dose adjustments this admission: 4/3: tamiflu reduced to 30mg  daily  Microbiology results: 4/3 BCx:  UCx: ordered    Thank you for allowing pharmacy to be a part of this patient's care.  Olyvia Gopal D. Hudsyn Barich, PharmD, BCPS Clinical Pharmacist Pager: [phone] 11/23/2016 8:25 AM

## 2016-11-23 NOTE — Progress Notes (Signed)
Results for WINDIE, MARASCO (MRN [id number]) as of 11/23/2016 14:00  Ref. Range 11/22/2016 16:49 11/22/2016 21:26 11/23/2016 09:04 11/23/2016 12:14  Glucose-Capillary Latest Ref Range: 65 - 99 mg/dL 145 (H) 269 (H) 240 (H) 251 (H)  Noted that blood sugars continue to be greater than 180 mg/dl. Recommend increasing Lantus to 8 units every HS (which is home dose), continuing Novolog SENSITIVE correction scale TID, but add HS scale.  Patient does take Novolog four times per day at home. Will continue to monitor blood sugars while in the hospital.   Harvel Ricks RN BSN CDE Diabetes Coordinator Pager: [phone]  8am-5pm

## 2016-11-23 NOTE — Progress Notes (Signed)
Medicine attending: I examined this patient today and I concur with the evaluation and management plan as recorded by resident physician Dr. Candace Cruise which we discussed together.  Please see separate attending history and physical note for complete details.

## 2016-11-24 ENCOUNTER — Inpatient Hospital Stay (HOSPITAL_COMMUNITY): Payer: Medicare Other

## 2016-11-24 ENCOUNTER — Encounter (HOSPITAL_COMMUNITY): Payer: Self-pay | Admitting: Student

## 2016-11-24 DIAGNOSIS — Z9889 Other specified postprocedural states: Secondary | ICD-10-CM

## 2016-11-24 DIAGNOSIS — Z9981 Dependence on supplemental oxygen: Secondary | ICD-10-CM

## 2016-11-24 DIAGNOSIS — J9601 Acute respiratory failure with hypoxia: Secondary | ICD-10-CM

## 2016-11-24 HISTORY — PX: IR THORACENTESIS ASP PLEURAL SPACE W/IMG GUIDE: IMG5380

## 2016-11-24 LAB — GLUCOSE, PLEURAL OR PERITONEAL FLUID: Glucose, Fluid: 148 mg/dL

## 2016-11-24 LAB — BLOOD GAS, ARTERIAL
ACID-BASE EXCESS: 2.2 mmol/L — AB (ref 0.0–2.0)
BICARBONATE: 27.4 mmol/L (ref 20.0–28.0)
Drawn by: 227661
O2 Content: 4 L/min
O2 Saturation: 89.8 %
PCO2 ART: 52.5 mmHg — AB (ref 32.0–48.0)
PH ART: 7.337 — AB (ref 7.350–7.450)
Patient temperature: 98.6
pO2, Arterial: 62 mmHg — ABNORMAL LOW (ref 83.0–108.0)

## 2016-11-24 LAB — BASIC METABOLIC PANEL
Anion gap: 9 (ref 5–15)
BUN: 43 mg/dL — AB (ref 6–20)
CALCIUM: 8.8 mg/dL — AB (ref 8.9–10.3)
CHLORIDE: 103 mmol/L (ref 101–111)
CO2: 30 mmol/L (ref 22–32)
CREATININE: 1.57 mg/dL — AB (ref 0.44–1.00)
GFR calc non Af Amer: 31 mL/min — ABNORMAL LOW (ref >60)
GFR, EST AFRICAN AMERICAN: 36 mL/min — AB (ref >60)
GLUCOSE: 83 mg/dL (ref 65–99)
Potassium: 4.4 mmol/L (ref 3.5–5.1)
Sodium: 142 mmol/L (ref 135–145)

## 2016-11-24 LAB — ALBUMIN, PLEURAL OR PERITONEAL FLUID: ALBUMIN FL: 1.6 g/dL

## 2016-11-24 LAB — BODY FLUID CELL COUNT WITH DIFFERENTIAL
Lymphs, Fluid: 3 %
Monocyte-Macrophage-Serous Fluid: 85 % (ref 50–90)
Neutrophil Count, Fluid: 12 % (ref 0–25)
Total Nucleated Cell Count, Fluid: 173 cu mm (ref 0–1000)

## 2016-11-24 LAB — CBC
HCT: 33.3 % — ABNORMAL LOW (ref 36.0–46.0)
Hemoglobin: 10.1 g/dL — ABNORMAL LOW (ref 12.0–15.0)
MCH: 29.2 pg (ref 26.0–34.0)
MCHC: 30.3 g/dL (ref 30.0–36.0)
MCV: 96.2 fL (ref 78.0–100.0)
PLATELETS: 143 10*3/uL — AB (ref 150–400)
RBC: 3.46 MIL/uL — ABNORMAL LOW (ref 3.87–5.11)
RDW: 16.3 % — AB (ref 11.5–15.5)
WBC: 3.8 10*3/uL — ABNORMAL LOW (ref 4.0–10.5)

## 2016-11-24 LAB — GLUCOSE, CAPILLARY
GLUCOSE-CAPILLARY: 63 mg/dL — AB (ref 65–99)
GLUCOSE-CAPILLARY: 180 mg/dL — AB (ref 65–99)
GLUCOSE-CAPILLARY: 142 mg/dL — AB (ref 65–99)
Glucose-Capillary: 95 mg/dL (ref 65–99)
Glucose-Capillary: 171 mg/dL — ABNORMAL HIGH (ref 65–99)

## 2016-11-24 LAB — LACTATE DEHYDROGENASE: LDH: 224 U/L — AB (ref 98–192)

## 2016-11-24 LAB — MRSA PCR SCREENING: MRSA BY PCR: NEGATIVE

## 2016-11-24 LAB — LACTATE DEHYDROGENASE, PLEURAL OR PERITONEAL FLUID: LD FL: 71 U/L — AB (ref 3–23)

## 2016-11-24 LAB — PROTIME-INR
INR: 2.49
PROTHROMBIN TIME: 27.4 s — AB (ref 11.4–15.2)

## 2016-11-24 LAB — GRAM STAIN

## 2016-11-24 LAB — PROTEIN, TOTAL: Total Protein: 5 g/dL — ABNORMAL LOW (ref 6.5–8.1)

## 2016-11-24 LAB — PROTEIN, PLEURAL OR PERITONEAL FLUID: Total protein, fluid: <3 g/dL

## 2016-11-24 MED ORDER — METHYLPREDNISOLONE SODIUM SUCC 40 MG IJ SOLR
40.0000 mg | Freq: Every day | INTRAMUSCULAR | Status: AC
  Administered 2016-11-24 – 2016-11-26 (×3): 40 mg via INTRAVENOUS
  Filled 2016-11-24 (×3): qty 1

## 2016-11-24 MED ORDER — ONDANSETRON HCL 4 MG PO TABS
4.0000 mg | ORAL_TABLET | Freq: Three times a day (TID) | ORAL | Status: DC | PRN
  Administered 2016-12-02: 4 mg via ORAL
  Filled 2016-11-24: qty 1

## 2016-11-24 MED ORDER — FUROSEMIDE 10 MG/ML IJ SOLN
40.0000 mg | Freq: Once | INTRAMUSCULAR | Status: AC
  Administered 2016-11-24: 40 mg via INTRAVENOUS
  Filled 2016-11-24: qty 4

## 2016-11-24 MED ORDER — WARFARIN SODIUM 5 MG PO TABS
2.5000 mg | ORAL_TABLET | Freq: Once | ORAL | Status: AC
  Administered 2016-11-24: 2.5 mg via ORAL
  Filled 2016-11-24: qty 1

## 2016-11-24 MED ORDER — LIDOCAINE HCL 1 % IJ SOLN
INTRAMUSCULAR | Status: AC
  Filled 2016-11-24: qty 20

## 2016-11-24 MED ORDER — DEXTROSE 5 % IV SOLN
1.0000 g | INTRAVENOUS | Status: AC
  Administered 2016-11-24 – 2016-11-27 (×4): 1 g via INTRAVENOUS
  Filled 2016-11-24 (×4): qty 1

## 2016-11-24 NOTE — Progress Notes (Signed)
Medicine attending:  I examined this patient today together with resident physician Dr. Candace Cruise and I concur with her evaluation and management plan which we discussed in detail together.  Increasing dyspnea at rest.  No cyanosis.  Oxygen saturation 94% on 3 L nasal cannula. Arterial blood gas on 3 L with pH 7.33, PCO2 52.5, PO2 62. Absent breath sounds and dull to percussion one half up over the right hemithorax.  Decreased breath sounds at the left lung base.  Upper lung clear. Chest radiograph which I personally reviewed now shows bilateral pulmonary infiltrates and pleural effusions worse compared with April 3 study.  Not clear whether the new left perihilar infiltrate is infectious or congestive. Trial of parenteral diuretic initiated. Right thoracentesis scheduled with interventional radiology.  1 L of straw-colored fluid removed.  Patient became uncomfortable and procedure was terminated.  Previous thoracenteses on this side have revealed transudates.  Fluid chemistries and cultures pending. Impression: Progressive hypoxic respiratory failure in an immunocompromised patient with documented influenza B.  We are covering her with broad-spectrum antibiotics in addition to antivirals in view of leukopenia and chronic immunosuppressive drugs.  Situation further complicated by underlying grade 2 diastolic cardiac dysfunction.  We discussed with the patient that she is retaining carbon dioxide and her blood oxygen level is borderline.  Her work of breathing is increasing.  The next step would be mechanical ventilation if her oxygenation continues to deteriorate.  She states that she does not want to be placed on a ventilator.  We will do everything short of this to improve her oxygenation and make her comfortable.  We will communicate with her husband.

## 2016-11-24 NOTE — Progress Notes (Signed)
ANTIBIOTIC CONSULT NOTE - INITIAL  Pharmacy Consult for Cefepime Indication: pneumonia  Allergies  Allergen Reactions  . Azithromycin Other (See Comments)    Upset stomach  . Erythromycin Nausea And Vomiting  . Fentanyl Nausea And Vomiting  . Percocet [Oxycodone-Acetaminophen] Nausea And Vomiting    Patient Measurements: Weight: 132 lb 0.9 oz (59.9 kg)  Vital Signs: Temp: 98.1 F (36.7 C) (04/05 0615) Temp Source: Oral (04/05 0615) BP: 122/75 (04/05 0615) Pulse Rate: 55 (04/05 0615) Intake/Output from previous day: 04/04 0701 - 04/05 0700 In: 1060 [P.O.:1060] Out: 0  Intake/Output from this shift: No intake/output data recorded.  Labs:  Recent Labs  11/22/16 0913 11/23/16 0643 11/24/16 0521  WBC 4.7 3.6* 3.8*  HGB 10.0* 9.4* 10.1*  PLT 141* 113* 143*  CREATININE 1.91* 1.83* 1.57*   Estimated Creatinine Clearance: 25.4 mL/min (A) (by C-G formula based on SCr of 1.57 mg/dL (H)). No results for input(s): VANCOTROUGH, VANCOPEAK, VANCORANDOM, GENTTROUGH, GENTPEAK, GENTRANDOM, TOBRATROUGH, TOBRAPEAK, TOBRARND, AMIKACINPEAK, AMIKACINTROU, AMIKACIN in the last 72 hours.   Microbiology: Recent Results (from the past 720 hour(s))  Blood culture (routine x 2)     Status: None (Preliminary result)   Collection Time: 11/22/16 12:52 PM  Result Value Ref Range Status   Specimen Description BLOOD RIGHT ANTECUBITAL  Final   Special Requests   Final    BOTTLES DRAWN AEROBIC AND ANAEROBIC Blood Culture adequate volume   Culture NO GROWTH < 24 HOURS  Final   Report Status PENDING  Incomplete  Blood culture (routine x 2)     Status: None (Preliminary result)   Collection Time: 11/22/16 12:54 PM  Result Value Ref Range Status   Specimen Description BLOOD RIGHT FOREARM  Final   Special Requests   Final    BOTTLES DRAWN AEROBIC ONLY Blood Culture adequate volume   Culture NO GROWTH < 24 HOURS  Final   Report Status PENDING  Incomplete    Medical History: Past Medical  History:  Diagnosis Date  . Adenomatous polyp   . Anemia   . 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 Bradycardia   . Carotid artery stenosis    mild  . Carotid bruit    left  . Chronic kidney disease    s/p cadaveric renal transplant in 2001  . Colitis   . Compression fracture of L4 lumbar vertebra ([facility])   . Coronary artery disease   . CVA (cerebral infarction)   . Diabetes mellitus   . Diabetic retinopathy   . Esophageal candidiasis ([place])   . Flu 11/22/2016  . Fracture of right maxilla 09/21/2012  . Fx ankle   . Fx wrist   . Gastritis   . GERD (gastroesophageal reflux disease)   . H/O immunosuppressive therapy   . History of orthostatic hypotension   . Hyperlipidemia   . Hyperparathyroidism   . Hypothyroidism   . IBS (irritable bowel syndrome)   . Ischemic colitis ([place])   . Lactose intolerance   . Leg ulcer ([place])    from poor fitting prosthetic  . Melanoma ([place])   . Nephropathy   . 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 Neuropathy ([place])   . Osteoporosis    recurrent fractures  . Pancreatic cyst   . Pneumonia 11/2016  . Pulmonary embolism ([place])   . PVD (peripheral vascular disease) ([place])   . Renal artery stenosis ([place])   . Renal disease  2ndary to diabetic nephropathy  . Renal osteodystrophy   . Right orbit fracture ([place]) 09/21/2012  . Stroke ([place])   . TBI (traumatic brain injury) ([place]) 09/30/2012  . Tubular adenoma   . Zygoma fracture ([place]) 09/21/2012    Assessment: 71 YOF with hx renal transplant on chronic immunosuppression who presented on 4/3 with cough/fever and was found to have influenza B. The team is now also concerned for superimposed bacterial PNA and have asked pharmacy to dose Cefepime. SCr 1.57 << 1.83, CrCl~20-30 ml/min.   Goal of Therapy:  Proper antibiotics for infection/cultures adjusted for renal/hepatic function   Plan:  1. Start Cefepime 1g IV  every 24 hours 2. Will continue to follow renal function, culture results, LOT, and antibiotic de-escalation plans   Thank you for allowing pharmacy to be a part of this patient's care.  Alycia Rossetti, PharmD, BCPS Clinical Pharmacist Pager: [phone] Clinical phone for 11/24/2016 from 7a-3:30p: [phone] If after 3:30p, please call main pharmacy at: [phone] 11/24/2016 10:25 AM

## 2016-11-24 NOTE — Progress Notes (Signed)
Late Entry  Patient arrived to 4E from [place] at 1750. Patient was transferred for acute respiratory distress and possibly needing bipap. Upon arrival to unit, O2 sats 89% on 3L n/c. Increased to 3.5L O2 n/c and sats increased to 95%. Lungs with coarse crackles present, but patient does not appear to be in respiratory distress and denies SOB. BP 107/32 (52) - Dr. Beryle Beams aware. Patient has had 3-4 loose, but not liquid BM's today - Dr. Beryle Beams aware, no new orders.   Joellen Jersey, RN.

## 2016-11-24 NOTE — Progress Notes (Signed)
ANTICOAGULATION CONSULT NOTE - Follow-Up  Pharmacy Consult for warfarin Indication: atrial fibrillation  Patient Measurements: Weight: 132 lb 0.9 oz (59.9 kg)  Vital Signs: Temp: 98.8 F (37.1 C) (04/05 1023) Temp Source: Oral (04/05 1023) BP: 124/39 (04/05 1100) Pulse Rate: 70 (04/05 1100)  Labs:  Recent Labs  11/22/16 0913 11/23/16 0643 11/24/16 0521  HGB 10.0* 9.4* 10.1*  HCT 30.8* 30.4* 33.3*  PLT 141* 113* 143*  APTT  --  47*  --   LABPROT 22.5* 22.8* 27.4*  INR 1.95 1.98 2.49  CREATININE 1.91* 1.83* 1.57*    Estimated Creatinine Clearance: 25.4 mL/min (A) (by C-G formula based on SCr of 1.57 mg/dL (H)).   Assessment: 41 YOF on warfarin PTA for hx Afib. Admit INR 1.95 on PTA dose of 2.5 mg/day EXCEPT for 5 mg on T/Th/Sun.   INR today is therapeutic (INR 2.49 << 1.98, goal of 2-3). Hgb/Hct/Plt trending up - no s/sx of bleeding noted at this time.   Goal of Therapy:  INR 2-3 Monitor platelets by anticoagulation protocol: Yes   Plan:  1. Warfarin 2.5 mg x 1 dose at 1800 today 2. Will continue to monitor for any signs/symptoms of bleeding and will follow up with PT/INR in the a.m.   Thank you for allowing pharmacy to be a part of this patient's care.  Alycia Rossetti, PharmD, BCPS Clinical Pharmacist Pager: [phone] Clinical phone for 11/24/2016 from 7a-3:30p: [phone] If after 3:30p, please call main pharmacy at: [phone] 11/24/2016 1:28 PM

## 2016-11-24 NOTE — Progress Notes (Signed)
Subjective: Patient appears clinically worse this morning on rounds. She is tachypneic with increased work of breathing and reports worsening of her SOB. She is maintaining oxygen saturations in the mid 90s on 3.5L [place].   Objective:  Vital signs in last 24 hours: Vitals:   11/23/16 0900 11/23/16 1723 11/24/16 0615 11/24/16 1023  BP: (!) 88/64 (!) 129/42 122/75 (!) 112/53  Pulse: 91 (!) 54 (!) 55 61  Resp: 18 18 15 16   Temp: 97.7 F (36.5 C) 99.3 F (37.4 C) 98.1 F (36.7 C) 98.8 F (37.1 C)  TempSrc: Oral Oral Oral Oral  SpO2: 94% 95% 94% 95%  Weight:       Physical Exam: Constitutional: Frail, ill appearing lady in NAD HEENT: Atraumatic, normocephalic. PERRL, anicteric sclera. Left temple area with healing biopsy site  Neck: Supple, trachea midline.  Cardiovascular:RRR, ausculation limited due to loud rhonchorous breath sounds  Pulmonary/Chest:Bilateral crackles, course bronchial breath sounds, scattered expiratory wheezing. Increased work of breathing and tachypneic in the 40s on 3.5L [place].  Extremities: Left BKA, Right lower extremity with pulses intact, warm and well perfused, no edema  Neurological:A&Ox3, CN II - XII grossly intact.   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   Influenza B Pneumonia: Patient presented with productive cough, congestion, and fevers up to 103. She has a history of renal transplant (2001) and is immunosuppressed on prograf, mycophenolate,and chronic prednisone 5 mg daily.Flu panel resulted positive for influenza B. Today, patient appears clinically worse with increased work of  breathing and tachypnea in the 40s despite treatment with Levaquin and tamiflu. She reports worsening SOB and is sating mid 90s on 3.5L. Repeat CXR today shows worsening pulmonary edema and bilateral pulmonary infiltrates - also with a new left consolidation. Pleural effusions appear stable. ABG today with hypoxia and CO2 retention (pH 7.33, pCO2 52, pO2 62 on 3.5L nasal cannula). Clinical status is currently tenuous. Will escalate antibiotics, diurese, add stress does steroids, and set up for thoracentesis of her pleural effusions (diagnostic and therapeutic).   -- Continue tamiflu, renally dosed (day 3/5)   -- Escalate Levaquin --> IV Cefepime for better staph coverage (day 3 abx) -- Stress dose steroids: Solumedrol 40 daily x 3 doses  -- US guided thoracentesis; f/u fluid analysis  -- IV Lasix 40 x 1 now  -- I/Os, daily weights  -- Supplemental oxygen prn; if decompensates, consider BiPAP  -- Droplet precautions  -- F/u blood cultures (drawn in the ED after abx dose) >> no growth x 24 hours   Hx Renal Transplant:Creatinine elevated to 1.9 on admission, improved today 1.5. Unclear baseline, last creatinine in our system was 1.2 two years ago. S/p 1 L NS in the ED and 75 cc NS over the past 48 hours. Now with plum edema and worsening respiratory status. Giving IV lasix 40 mg x 1. Will follow I&Os and renal function closely.  -- Stop 75 cc NS  -- Continue home prograf, mycophenolate,and prednisone 5 mg daily  -- Repeat AM BMP   Diastolic Heart Failure: Likely ischemic CAD. Previous left pleural effusion has nearly resolved, now with new right sided pleural effusion.  --  Holding home prn torsemide  -- IV lasix 40 x 1 -- I/O, daily weights   Type II DM:Takes Lantus 8 units and SSI with meals at home. Started on 6 units QHS and S-SSI on admission but CBGs were uncontrolled. Regimen increased yesterday now with better control.  -- 8 units Lantus QHS -- S-SSI TID w/ meals + HS scale   Hx  Paroxsymal Atrial Fibrillation: Not currently on any rate or rhythm control medications. Bradycardic and regular this admission. -- Continue home warfarin   Hx Iron Deficiency Anemia: -- Continue home iron supplements  Hx Hypothyroidism: S/p thyroidectomy  -- Continue home Synthroid 112 mcg   Hx Home Orthostasis/hypotension: -- Hold home midodrine  -- Monitor vitals   FEN: No fluid (lasix), replete lytes prn, heart healthy carb mod VTE ppx: Warfarin  Code Status: DNR   Dispo: Anticipated discharge pending improvement in clinical status.   Velna Ochs, MD 11/24/2016, 10:53 AM Pager: [phone]

## 2016-11-24 NOTE — Progress Notes (Signed)
Spoke with patient's husband [name] and updated him on her current clinical status and plan. Patient has progressive hypoxic and hypercarbic respiratory failure secondary influenza B infection. Complicated by immunosuppression (hx renal transplant) and right pleural effusion now s/p thoracentesis (1L fluid removed). Antibiotics were escalated to cefepime due to worsening clinical status and failure to improve on tamiflu and Levaquin. She is currently maintaining oxygenation on 3-4L [place] but is tachypneic with increased work of breathing. Stress dose steroids were started and she was given 40 mg IV lasix x 1 for pulmonary edema. Confirmed with both patient and husband that if she decompensates she does not want to be placed on mechanical ventilation. We will continue current treatment, BiPAP if she deteriorates. Husband is aware of tenuous clinical status. All questions were answered.   Velna Ochs, M.D. Pager: [phone] 11/24/2016, 2:35 PM

## 2016-11-24 NOTE — Procedures (Addendum)
PROCEDURE SUMMARY:  Successful US guided right diagnostic and therapeutic thoracentesis. Yielded 1.0L of clear, yellow fluid. Pt with fair tolerance of procedure. No immediate complications. Procedure was stopped prior to removal of all fluid due to patient discomfort and nausea.   Specimen was sent for labs. CXR ordered.  Docia Barrier PA-C 11/24/2016 12:52 PM

## 2016-11-24 NOTE — Progress Notes (Signed)
Medicine attending Breathing much more comfortably. O2 sat now 97% on 3.5 liters nasal cannula. We can hold off on BPAP for now Husband here. Status & plan reviewed.

## 2016-11-25 LAB — CBC
HCT: 33.2 % — ABNORMAL LOW (ref 36.0–46.0)
Hemoglobin: 10.7 g/dL — ABNORMAL LOW (ref 12.0–15.0)
MCH: 30.7 pg (ref 26.0–34.0)
MCHC: 32.2 g/dL (ref 30.0–36.0)
MCV: 95.1 fL (ref 78.0–100.0)
PLATELETS: 154 10*3/uL (ref 150–400)
RBC: 3.49 MIL/uL — AB (ref 3.87–5.11)
RDW: 16.2 % — AB (ref 11.5–15.5)
WBC: 3.1 10*3/uL — AB (ref 4.0–10.5)

## 2016-11-25 LAB — GLUCOSE, CAPILLARY
GLUCOSE-CAPILLARY: 205 mg/dL — AB (ref 65–99)
GLUCOSE-CAPILLARY: 222 mg/dL — AB (ref 65–99)
GLUCOSE-CAPILLARY: 193 mg/dL — AB (ref 65–99)
Glucose-Capillary: 142 mg/dL — ABNORMAL HIGH (ref 65–99)

## 2016-11-25 LAB — BASIC METABOLIC PANEL
Anion gap: 9 (ref 5–15)
BUN: 42 mg/dL — ABNORMAL HIGH (ref 6–20)
CHLORIDE: 102 mmol/L (ref 101–111)
CO2: 29 mmol/L (ref 22–32)
CREATININE: 1.67 mg/dL — AB (ref 0.44–1.00)
Calcium: 8.8 mg/dL — ABNORMAL LOW (ref 8.9–10.3)
GFR, EST AFRICAN AMERICAN: 33 mL/min — AB (ref >60)
GFR, EST NON AFRICAN AMERICAN: 28 mL/min — AB (ref >60)
Glucose, Bld: 152 mg/dL — ABNORMAL HIGH (ref 65–99)
Potassium: 4.8 mmol/L (ref 3.5–5.1)
SODIUM: 140 mmol/L (ref 135–145)

## 2016-11-25 LAB — PH, BODY FLUID: PH, BODY FLUID: 7.5

## 2016-11-25 LAB — PROTIME-INR
INR: 3.01
Prothrombin Time: 31.9 seconds — ABNORMAL HIGH (ref 11.4–15.2)

## 2016-11-25 MED ORDER — GUAIFENESIN ER 600 MG PO TB12
600.0000 mg | ORAL_TABLET | Freq: Two times a day (BID) | ORAL | Status: DC
  Administered 2016-11-25 – 2016-12-02 (×14): 600 mg via ORAL
  Filled 2016-11-25 (×14): qty 1

## 2016-11-25 MED ORDER — HYDRALAZINE HCL 20 MG/ML IJ SOLN: 5.0000 mg | Freq: Once | INTRAMUSCULAR | Status: DC

## 2016-11-25 MED ORDER — GUAIFENESIN-DM 100-10 MG/5ML PO SYRP: 5.0000 mL | ORAL_SOLUTION | ORAL | Status: DC | PRN

## 2016-11-25 MED ORDER — HYDRALAZINE HCL 20 MG/ML IJ SOLN
5.0000 mg | Freq: Once | INTRAMUSCULAR | Status: DC
  Filled 2016-11-25: qty 1

## 2016-11-25 MED ORDER — TORSEMIDE 20 MG PO TABS
20.0000 mg | ORAL_TABLET | Freq: Every day | ORAL | Status: DC
  Administered 2016-11-25 – 2016-11-28 (×4): 20 mg via ORAL
  Filled 2016-11-25 (×4): qty 1

## 2016-11-25 NOTE — Progress Notes (Signed)
ANTICOAGULATION CONSULT NOTE - Follow-Up  Pharmacy Consult for warfarin Indication: atrial fibrillation  Patient Measurements: Height: 5\' 3"  (160 cm) Weight: 127 lb 10.3 oz (57.9 kg) IBW/kg (Calculated) : 52.4  Vital Signs: Temp: 97.7 F (36.5 C) (04/06 0700) Temp Source: Oral (04/06 0700) BP: 178/56 (04/06 0700) Pulse Rate: 58 (04/06 0700)  Labs:  Recent Labs  11/23/16 0643 11/24/16 0521 11/25/16 0259  HGB 9.4* 10.1* 10.7*  HCT 30.4* 33.3* 33.2*  PLT 113* 143* 154  APTT 47*  --   --   LABPROT 22.8* 27.4* 31.9*  INR 1.98 2.49 3.01  CREATININE 1.83* 1.57* 1.67*    Estimated Creatinine Clearance: 23.3 mL/min (A) (by C-G formula based on SCr of 1.67 mg/dL (H)).   Assessment: 62 YOF on warfarin PTA for hx Afib. Admit INR 1.95 on PTA dose of 2.5 mg/day EXCEPT for 5 mg on T/Th/Sun.   INR today is now supratherapeutic (INR 3.01) likely due to higher than usual warfarin doses, antibiotic therapy, and medical illness. Hgb/Hct/Plt trending up - no s/sx of bleeding noted at this time.   Goal of Therapy:  INR 2-3 Monitor platelets by anticoagulation protocol: Yes   Plan:  1. No warfarin today 2. Will continue to monitor for any signs/symptoms of bleeding and will follow up with PT/INR in the a.m.   Thank you for allowing pharmacy to be a part of this patient's care.  Legrand Como, Pharm.D., BCPS, AAHIVP Clinical Pharmacist Phone: [phone] or [phone] 11/25/2016, 10:33 AM

## 2016-11-25 NOTE — Progress Notes (Addendum)
Subjective: Patient is doing much better this morning. She is breathing more comfortably and reports improvement in her symptoms of shortness of breath.  Objective:  Vital signs in last 24 hours: Vitals:   11/25/16 0600 11/25/16 0651 11/25/16 0700 11/25/16 1100  BP: (!) 184/36 (!) 188/68 (!) 178/56 (!) 159/143  Pulse: 60 (!) 59 (!) 58 (!) 59  Resp: 20 20 20  (!) 22  Temp:   97.7 F (36.5 C) 97.8 F (36.6 C)  TempSrc:   Oral Oral  SpO2: 97% 94% 96% 98%  Weight:      Height:       Physical Exam: Constitutional: Frail, ill appearing lady in NAD HEENT: Atraumatic, normocephalic. PERRL, anicteric sclera. Left temple area with healing biopsy site  Neck: Supple, trachea midline.  Cardiovascular:RRR, ausculation limited due to loud rhonchorous breath sounds  Pulmonary/Chest:Bibasilar crackles, lungs sounds overall improved. Breathing comfortably on 3L [place]. Extremities: Left BKA, Right lower extremity with pulses intact, warm and well perfused, no edema  Neurological:A&Ox3, CN II - XII grossly intact.   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   Influenza B Pneumonia: Patient presented with productive cough, congestion, and fevers up to 103. She has a history of renal transplant (2001) and is immunosuppressed on prograf, mycophenolate,and chronic prednisone 5 mg daily.Flu panel resulted positive for influenza B. She was started on gentle fluids, tamiflu ,and Levaquin on admission but failed to improve. Yesterday she developed acute hypoxic respiratory failure with increased work of breathing and tachypnea. Repeat CXR showed  pulmonary edema and worsening of her chronic pleural effusion. She went for thoracentesis yesterday (1L fluid removed) and fluid analysis was consistent with transudative process. She was given IV lasix 40 mg x 1, broadened to cefepime, and started on stress does steroids. Today, patient is significantly improved, breathing comfortably on 3L [place]. She reports improvement in her symptoms. I/Os inaccurate as patient is incontinent with urine, weight seem unreliable.  -- Continue tamiflu, renally dosed (day 4/5)  -- Continue IV Cefepime  (day 4 abx) - consider narrowing tomorrow if patient continues to improve  -- Stress dose steroids: Solumedrol 40 daily x 3 doses  -- S/p US guided thoracentesis; f/u fluid cultures  -- Restart home torsemide (normally takes prn - ordered as 20 mg daily scheduled for now)  -- I/Os, daily weights  -- Supplemental oxygen prn; if decompensates, consider BiPAP  -- Droplet precautions  -- F/u blood cultures (drawn in the ED after abx dose) >>no growth x 2 days  Asymptomatic Bacteriuria: Patient denies symptoms of dysuria but UA was checked on admission due to her fever and chronic urinary incontinence. UA was completely negative but UCx grew 80,000 colonies of enterococcus faecium. Patient has been afebrile and is clinically improving. Will monitor for now.   Hx Renal Transplant:Creatinine elevated to 1.9 on admission.  Unclear baseline, last creatinine in our system was 1.2 two years ago. S/p 1 L NS in the ED and 75 cc NS x 48 hours on admission. She subsequently developed plum edema and worsening respiratory status. S/p IV lasix 40 mg x 1, home torsemide restarted today. Renal function has remained  stable.  -- Continue home prograf, mycophenolate,and prednisone 5 mg daily  -- Repeat AM BMP   Diastolic Heart Failure: Likely ischemic CAD. Previous left pleural effusion has nearly resolved, now with new right sided pleural effusion.  --Restart home torsemide 20mg  daily    -- I/O, daily weights   Type II DM:Takes Lantus 8 units and SSI with meals at home. Started on 6 units QHS and S-SSI on admission but CBGs were uncontrolled. Regimen increased now with better control.  -- 8units Lantus QHS -- S-SSI TID w/ meals + HS scale   Hx Paroxsymal Atrial Fibrillation: Not currently on any rate or rhythm control medications. Bradycardic and regular this admission. -- Continue home warfarin   Hx Iron Deficiency Anemia: -- Continue home iron supplements  Hx Hypothyroidism: S/p thyroidectomy  -- Continue home Synthroid 112 mcg   Hx Home Orthostasis/hypotension: -- Hold home midodrine  -- Monitor vitals   FEN: No fluid (lasix), replete lytes prn, heart healthy carb mod VTE ppx: Warfarin  Code Status: DNR   Dispo: Anticipated discharge in approximately 2-3 day(s).   Velna Ochs, MD 11/25/2016, 12:36 PM Pager: [phone]  Medicine attending: I personally examined this patient today and I concur with the evaluation and management plan as recorded by resident physician Dr. Candace Cruise which we discussed together. She remained stable overnight.  Breathing comfortably at rest.  Stable oxygenation at or above 95% on 3.5 L nasal cannula oxygen. Episode of hypoxic respiratory failure complex and multifactorial related to acute viral pneumonitis, chronic decompensated diastolic heart failure, and recurrent right pleural effusion.  Combination of antiviral drugs, antibiotics, diuretics, and a 1 L right thoracentesis, done yesterday appeared to have been successful in stabilizing her respiratory status.  We will continue to monitor her closely.  We should be able to stop antibacterial antibiotics if cultures remain sterile at 72 hours.  Continue full course of Tamiflu.  We have resumed a daily diuretic. We made need to adjust her Coumadin dose.  Murriel Hopper, MD, [place]  Hematology-Oncology/Internal Medicine

## 2016-11-26 ENCOUNTER — Inpatient Hospital Stay (HOSPITAL_COMMUNITY): Payer: Medicare Other

## 2016-11-26 DIAGNOSIS — I509 Heart failure, unspecified: Secondary | ICD-10-CM

## 2016-11-26 DIAGNOSIS — Z9889 Other specified postprocedural states: Secondary | ICD-10-CM

## 2016-11-26 LAB — BASIC METABOLIC PANEL
Anion gap: 11 (ref 5–15)
BUN: 50 mg/dL — AB (ref 6–20)
CALCIUM: 9.1 mg/dL (ref 8.9–10.3)
CO2: 29 mmol/L (ref 22–32)
Chloride: 101 mmol/L (ref 101–111)
Creatinine, Ser: 1.71 mg/dL — ABNORMAL HIGH (ref 0.44–1.00)
GFR calc Af Amer: 32 mL/min — ABNORMAL LOW (ref >60)
GFR, EST NON AFRICAN AMERICAN: 28 mL/min — AB (ref >60)
GLUCOSE: 215 mg/dL — AB (ref 65–99)
Potassium: 4.5 mmol/L (ref 3.5–5.1)
Sodium: 141 mmol/L (ref 135–145)

## 2016-11-26 LAB — CBC
HCT: 36.3 % (ref 36.0–46.0)
Hemoglobin: 11 g/dL — ABNORMAL LOW (ref 12.0–15.0)
MCH: 28.2 pg (ref 26.0–34.0)
MCHC: 30.3 g/dL (ref 30.0–36.0)
MCV: 93.1 fL (ref 78.0–100.0)
Platelets: 151 10*3/uL (ref 150–400)
RBC: 3.9 MIL/uL (ref 3.87–5.11)
RDW: 15.5 % (ref 11.5–15.5)
WBC: 2.9 10*3/uL — ABNORMAL LOW (ref 4.0–10.5)

## 2016-11-26 LAB — GLUCOSE, CAPILLARY
GLUCOSE-CAPILLARY: 208 mg/dL — AB (ref 65–99)
GLUCOSE-CAPILLARY: 246 mg/dL — AB (ref 65–99)
Glucose-Capillary: 114 mg/dL — ABNORMAL HIGH (ref 65–99)

## 2016-11-26 LAB — PROTIME-INR
INR: 2.52
PROTHROMBIN TIME: 27.6 s — AB (ref 11.4–15.2)

## 2016-11-26 LAB — TSH: TSH: 0.45 u[IU]/mL (ref 0.350–4.500)

## 2016-11-26 MED ORDER — PREDNISONE 10 MG PO TABS
5.0000 mg | ORAL_TABLET | Freq: Every day | ORAL | Status: DC
  Administered 2016-11-27 – 2016-12-02 (×6): 5 mg via ORAL
  Filled 2016-11-26 (×6): qty 1

## 2016-11-26 MED ORDER — HYDRALAZINE HCL 20 MG/ML IJ SOLN
2.5000 mg | Freq: Once | INTRAMUSCULAR | Status: AC
  Administered 2016-11-26: 3 mg via INTRAVENOUS

## 2016-11-26 MED ORDER — WARFARIN SODIUM 5 MG PO TABS
2.5000 mg | ORAL_TABLET | Freq: Once | ORAL | Status: AC
  Administered 2016-11-26: 2.5 mg via ORAL
  Filled 2016-11-26: qty 1

## 2016-11-26 NOTE — Progress Notes (Addendum)
Subjective: Breathing has improved today. Complaining of frequent urination due to diuretic use. Continues to complain of cough. Desatted to 53 when weaned to room air, oxygenation improved to mid 90s with 3.5L [place].   Objective:  Vital signs in last 24 hours: Vitals:   11/26/16 0200 11/26/16 0400 11/26/16 0500 11/26/16 0753  BP: (!) 178/67 (!) 168/54  (!) 124/41  Pulse: (!) 55 (!) 49  60  Resp: (!) 21 17  (!) 21  Temp:  98.2 F (36.8 C)  98.3 F (36.8 C)  TempSrc:  Oral  Oral  SpO2: 98% 97%  96%  Weight:   123 lb 3.8 oz (55.9 kg)   Height:       Physical Exam: Constitutional: Frail, ill appearing lady in NAD HEENT: Atraumatic, normocephalic. PERRL, anicteric sclera. Left temple area with healing biopsy site  Neck: Supple, trachea midline.  Cardiovascular:RRR, ausculation limited due to loud rhonchorous breath sounds  Pulmonary/Chest: Mild Bibasilar crackles, lungs sounds overall improved. Breathing comfortably on 3L [place]. Extremities: Left BKA, Right lower extremity with pulses intact, warm and well perfused, no edema  Neurological:A&Ox3, CN II - XII grossly intact.   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   Influenza B Pneumonia: In an immunocompromised host (renal transplant) with possible superimposed bacterial PNA. Today is day 5 of tamiflu and cefepime. Patient continues to have cough but reports her SOB has improved. She is persistently hypoxic, requiring 3.5 L to maintain oxygenation with sats in the mid 90s. Desatted to 2 when weaned to room air today.  -- Last dose of tamiflu today, day 5/5 (renally dosed)   -- IV Cefepime (day 5/5)  -- Last dose of stress dose steroids IV solumedrol 40 mg daily x 3; resume home prednisone 5 mg daily tomorrow  -- Blood cultures >> no growth x 3 days  -- F/u repeat CXR today  -- Supplemental oxygen prn  -- PT -- Incentive spirometry   Diastolic Heart Failure: Likely ischemic CAD. On admission CXR, previous left pleural effusion had nearly resolved, with new right sided pleural effusion. Patient was started on gentle fluids due to sepsis and concern for AKI, unfortunately developed pulmonary edema and progression of her pleural effusion. She is now s/p thoracentesis (-1L) on 4/6 and fluid analysis was consistent with transudative process (cultures pending). She was started on torsemide and she has been diuresing well. Respiratory status has significantly improved.  -- Continue home torsemide 20mg  daily  -- I/O, daily weights  -- f/u pleural fluid cultures >> no growth x 24 hours  -- F/u CXR   Asymptomatic Bacteriuria: Patient denies symptoms of dysuria but UA was checked on admission due to her fever and chronic urinary incontinence. UA was completely negative but UCx grew 80,000 colonies of enterococcus faecium. Patient has been afebrile and is clinically improving. Will monitor for now.   Hx Renal Transplant:Creatinine elevated to 1.9 on admission.  Unclear baseline, last creatinine in our system was 1.2 two years ago. S/p 1 L NS in the ED and 75 cc NS x 48 hours on admission. She subsequently developed plum edema and worsening respiratory status. S/p IV  lasix 40 mg x 1, home torsemide restarted yesterday. Renal function has remained stable, creatinine 1.7 today.  -- Continue home prograf, mycophenolate,and prednisone 5 mg daily  -- Follow AM BMP   Type II DM:Takes Lantus 8 units and SSI with meals at home. Started on 6 units QHS and S-SSI on admission but CBGs were uncontrolled. Regimen increased now with better control.  -- 8units Lantus QHS -- S-SSI TID  w/ meals + HS scale   Hx Paroxsymal Atrial Fibrillation: Not currently on any rate or rhythm control medications. Bradycardic and regular this admission. -- Continue home warfarin   Hx Iron Deficiency Anemia: -- Continue home iron supplements  Hx Hypothyroidism: S/p thyroidectomy  -- Continue home Synthroid 112 mcg   Hx Home Orthostasis/hypotension: -- Hold home midodrine  -- Monitor vitals   FEN: No fluid (lasix), replete lytes prn, heart healthy carb mod VTE ppx: Warfarin  Code Status: DNR   Dispo: Anticipated discharge in approximately 1-2 day(s).   Elizabeth Ochs, MD 11/26/2016, 9:17 AM Pager: [phone]  Medicine attending: I personally examined this patient today and I concur with the evaluation and management plan by resident physician Dr. Candace Cruise. No acute change.  She is requiring oxygen to maintain acceptable oxygen saturation. She will complete a 5 day course of Tamiflu and cefipime today. Creatinine stable at 1.7 White count remains low at 2900, hemoglobin stable. Follow-up chest x-ray to assess radiographic response to diuretics. She is deconditioned.  We will ask physical and occupational therapy to see her. Unfortunately, her husband has now contracted influenza.

## 2016-11-26 NOTE — Progress Notes (Signed)
ANTICOAGULATION CONSULT NOTE - Follow-Up  Pharmacy Consult for warfarin Indication: atrial fibrillation  Patient Measurements: Height: 5\' 3"  (160 cm) Weight: 123 lb 3.8 oz (55.9 kg) IBW/kg (Calculated) : 52.4  Vital Signs: Temp: 98.3 F (36.8 C) (04/07 0753) Temp Source: Oral (04/07 0753) BP: 124/41 (04/07 0753) Pulse Rate: 60 (04/07 0753)  Labs:  Recent Labs  11/24/16 0521 11/25/16 0259 11/26/16 0234  HGB 10.1* 10.7* 11.0*  HCT 33.3* 33.2* 36.3  PLT 143* 154 151  LABPROT 27.4* 31.9* 27.6*  INR 2.49 3.01 2.52  CREATININE 1.57* 1.67* 1.71*    Estimated Creatinine Clearance: 22.8 mL/min (A) (by C-G formula based on SCr of 1.71 mg/dL (H)).   Assessment: 33 YOF on warfarin PTA for hx Afib. Admit INR 1.95 on PTA dose of 2.5 mg/day EXCEPT for 5 mg on T/Th/Sun.   INR today is now therapeutic (INR 2.52). Significant DDIs with antibiotic therapy (stop dates entered for 4/8). Variable PO intake. Hgb trending up, PLTC wnl - no s/sx of bleeding noted at this time.   Goal of Therapy:  INR 2-3 Monitor platelets by anticoagulation protocol: Yes   Plan:  1. Warfarin 2.5 mg x1 today 2. Daily INR, CBC 3. Monitor for s/sx bleeding, PO intake, new DDI, clinical picture  Carlean Jews, Pharm.D. PGY1 Pharmacy Resident 4/7/201812:01 PM Pager [phone]

## 2016-11-27 LAB — CULTURE, BLOOD (ROUTINE X 2)
CULTURE: NO GROWTH
Culture: NO GROWTH
SPECIAL REQUESTS: ADEQUATE
Special Requests: ADEQUATE

## 2016-11-27 LAB — PROTIME-INR
INR: 2.21
Prothrombin Time: 24.9 seconds — ABNORMAL HIGH (ref 11.4–15.2)

## 2016-11-27 LAB — GLUCOSE, CAPILLARY
GLUCOSE-CAPILLARY: 104 mg/dL — AB (ref 65–99)
GLUCOSE-CAPILLARY: 62 mg/dL — AB (ref 65–99)
Glucose-Capillary: 80 mg/dL (ref 65–99)
Glucose-Capillary: 119 mg/dL — ABNORMAL HIGH (ref 65–99)

## 2016-11-27 MED ORDER — PROMETHAZINE HCL 25 MG PO TABS
12.5000 mg | ORAL_TABLET | Freq: Three times a day (TID) | ORAL | Status: DC | PRN
  Administered 2016-11-27: 12.5 mg via ORAL
  Filled 2016-11-27: qty 1

## 2016-11-27 MED ORDER — WARFARIN SODIUM 5 MG PO TABS
5.0000 mg | ORAL_TABLET | Freq: Once | ORAL | Status: AC
  Administered 2016-11-27: 5 mg via ORAL
  Filled 2016-11-27: qty 1

## 2016-11-27 NOTE — Progress Notes (Signed)
Medicine attending: I examined this patient today and I concur with the evaluation and management plan as recorded by resident physician Dr. Candace Cruise. Overall stable for the last 24 hours despite some fluctuation in her oxygenation.  We reviewed follow-up chest radiograph.  Much of the left perihilar change has resolved back on diuretic therapy and was likely fluid overload and not an infectious infiltrate.  She has just completed 5 days of antibiotics and antiviral drugs.  We will move ahead with physical therapy and begin discharge planning. We discussed interfacing with her pulmonologist Dr. Raul Del in [place] to see if he would be in favor of having a Pleurx catheter placed in the right hemithorax for ongoing management of refractory and recurrent right pleural effusion.

## 2016-11-27 NOTE — Evaluation (Signed)
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 Will need continued skilled PT to address deficits and maximize function. Will see as indicated and progress as tolerated.   OF NOTE: VSS on supplemntal O2 but patient with increased nausea and vomiting during session (nsg aware)    Follow Up Recommendations Home health PT;Supervision/Assistance - 24 hour    Equipment Recommendations  None recommended by PT    Recommendations for Other Services       Precautions / Restrictions Precautions Precautions: Fall Precaution Comments: bilateral LEs braces/prosthesis devices that require assist for donning      Mobility  Bed Mobility Overal bed mobility: Needs Assistance Bed Mobility: Supine to Sit     Supine to sit: Min assist     General bed mobility comments: min assist to elevate trunk and rotate to EOB. Increased time to perform (limited by nausea this session)  Transfers Overall transfer level: Needs assistance Equipment used: 2 person hand held assist (face to face) Transfers: Sit to/from [employer] Transfers Sit to Stand: Mod assist;+2 physical assistance Stand pivot transfers: Mod assist;+2 physical assistance       General transfer comment: Moderate assist, difficulty powering up and maintaining upright during pivot to chair. increased time and effort to perform  Ambulation/Gait                Stairs            Wheelchair Mobility    Modified Rankin (Stroke Patients Only)       Balance Overall balance assessment: Needs assistance Sitting-balance support: Feet supported Sitting balance-Leahy Scale: Fair Sitting balance - Comments: sitting EOB self supported for periods of time with and without UE support     Standing balance-Leahy Scale: Poor                               Pertinent Vitals/Pain Pain Assessment: 0-10 Pain Score: 3  Pain Location: chest (from coughing per patient) Pain Descriptors / Indicators: Discomfort;Sore Pain Intervention(s): Monitored during session    Home Living Family/patient expects to be discharged to:: Private residence Living Arrangements: Spouse/significant other Available Help at Discharge: Family;Available 24 hours/day Type of Home: House Home Access: Stairs to enter Entrance Stairs-Rails: None Entrance Stairs-Number of Steps: 1 Home Layout: Two level;Able to live on main level with bedroom/bathroom Home Equipment: Gilford Rile - 2 wheels;Shower seat;Bedside commode;Wheelchair - manual;Grab bars - tub/shower;Grab bars - toilet      Prior Function Level of Independence: Needs assistance   Gait / Transfers Assistance Needed: husband dons braces and prosthesis, patient performs transfers     Comments: husband assists with some ADLs but patient reports independence with mobility ocassional use of device if needed     Hand Dominance   Dominant Hand: Right    Extremity/Trunk Assessment   Upper Extremity Assessment Upper Extremity Assessment: Generalized weakness    Lower Extremity Assessment Lower Extremity  Assessment: RLE deficits/detail;LLE deficits/detail RLE Deficits / Details: baseline RLE deformity present, limited overall strength and ROM  (wears KAFO bracing) RLE Coordination: decreased fine motor;decreased gross motor LLE Deficits / Details: wears prosthetic device LLE Coordination:  decreased fine motor;decreased gross motor       Communication   Communication: No difficulties  Cognition Arousal/Alertness: Awake/alert Behavior During Therapy: Flat affect Overall Cognitive Status: No family/caregiver present to determine baseline cognitive functioning                                        General Comments General comments (skin integrity, edema, etc.): patient with projectile vomiting x2 during session    Exercises     Assessment/Plan    PT Assessment Patient needs continued PT services  PT Problem List Decreased strength;Decreased activity tolerance;Decreased balance;Decreased mobility;Decreased coordination       PT Treatment Interventions DME instruction;Gait training;Stair training;Functional mobility training;Therapeutic activities;Therapeutic exercise;Balance training;Patient/family education    PT Goals (Current goals can be found in the Care Plan section)  Acute Rehab PT Goals Patient Stated Goal: to feel better PT Goal Formulation: With patient Time For Goal Achievement: 12/11/16 Potential to Achieve Goals: Good    Frequency Min 3X/week   Barriers to discharge        Co-evaluation               End of Session Equipment Utilized During Treatment: Oxygen Activity Tolerance: Treatment limited secondary to medical complications (Comment) (projectile vomiting x2) Patient left: in chair;with call bell/phone within reach;with chair alarm set;with nursing/sitter in room Nurse Communication: Mobility status (vomiting x2 (pills in emesis)) PT Visit Diagnosis: Muscle weakness (generalized) (M62.81)    Time: 0936-1000 PT Time Calculation (min) (ACUTE ONLY): 24 min   Charges:   PT Evaluation $PT Eval Moderate Complexity: 1 Procedure PT Treatments $Therapeutic Activity: 8-22 mins   PT G Codes:        Alben Deeds, PT DPT  [phone]   Duncan Dull 11/27/2016, 11:25 AM

## 2016-11-27 NOTE — Progress Notes (Signed)
Subjective: Patient feels well today. Reports her breathing has significantly improved. She feels and almost back to her baseline.  Objective:  Vital signs in last 24 hours: Vitals:   11/27/16 0000 11/27/16 0011 11/27/16 0308 11/27/16 0715  BP: (!) 133/59 (!) 133/59 (!) 142/67 103/71  Pulse: (!) 58 61 69 (!) 51  Resp: 17 17 14  (!) 25  Temp:  98.4 F (36.9 C) 98.3 F (36.8 C) 98.3 F (36.8 C)  TempSrc:  Oral Oral Oral  SpO2: 99% 95% 98% 99%  Weight:   121 lb 14.6 oz (55.3 kg)   Height:       Physical Exam: Constitutional: Frail, ill appearing lady in NAD HEENT: Atraumatic, normocephalic. PERRL, anicteric sclera. Left temple area with healing biopsy site  Neck: Supple, trachea midline.  Cardiovascular:RRR, ausculation limited due to loud rhonchorous breath sounds  Pulmonary/Chest: Mild Bibasilar crackles, lungs sounds overall improved. Breathing comfortably on 3L [place]. Extremities: Left BKA, Right lower extremity with pulses intact, warm and well perfused, no edema  Neurological:A&Ox3, CN II - XII grossly intact.    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   Influenza B Pneumonia: In an immunocompromised host (renal transplant) with likely superimposed bacterial PNA - now s/p 5 day treatment course with Tamiflu and Cefepime. She is afebrile and breathing has improved. Oxygenation is improving, however she is still requiring 2-3 L to maintain oxygen in the 90s.  -- S/p treatment  -- Blood cultures >> no growth x 3 days  -- Supplemental oxygen prn  -- PT -- Incentive spirometry  -- Ambulatory pulse ox today    Diastolic Heart Failure: Likely ischemic CAD. On admission CXR, previous left pleural effusion had nearly resolved, with new right sided pleural effusion. Patient was started on gentle fluids due to sepsis and concern for AKI, unfortunately developed pulmonary edema and progression of her pleural effusion. She is now s/p thoracentesis (-1L) on 4/6 and fluid analysis was consistent with transudative process (cultures pending). She was started on torsemide and she has been diuresing well. Pleural effusion has persisted on repeat CXR from yesterday but respiratory status seems improved. She has been persistently requiring oxygen this admission, but hypoxia overall improved today. Oxygen sats dropped to 89 on room air today, improved from 82 yesterday. Will have PT check an ambulatory pulse ox during evaluation. Patient may require temporary home oxygen as she continues to improve from this acute illness. Will likely discharge tomorrow with scheduled follow up with her pulmonologist for further management of her pleural effusion.  -- Continue home torsemide 20mg  daily  -- I/O, daily weights  -- f/u pleural fluid cultures >> no growth x 2 days   Asymptomatic Bacteriuria:Patient denies symptoms of dysuria but UA was checked on admission due to her fever and chronic urinary incontinence. UA was completely negative but UCx grew 80,000 colonies of enterococcus faecium. Patient has been afebrile and is clinically improving. Will monitor for now.   Hx Renal Transplant:Creatinine elevated to 1.9 on admission. Unclear baseline, last creatinine in our system was 1.2 two years ago. S/p 1 L NS in the ED and 75 cc NS x 48  hours on admission. She subsequently developedplum edema and worsening respiratory status. S/p IV lasix 40 mg x 1, home torsemide restarted yesterday. Renal function has remained stable.  -- Continue home prograf, mycophenolate,and prednisone 5 mg daily  -- Follow AM BMP   Type II DM:Takes  Lantus 8 units and SSI with meals at home. Started on 6 units QHS and S-SSI on admission but CBGs were uncontrolled. Regimen increased now with better control.  -- 8units Lantus QHS -- S-SSI TID w/ meals + HS scale   Hx Paroxsymal Atrial Fibrillation: Not currently on any rate or rhythm control medications. Bradycardic and regular this admission. -- Continue home warfarin   Hx Iron Deficiency Anemia: -- Continue home iron supplements  Hx Hypothyroidism: S/p thyroidectomy  -- Continue home Synthroid 112 mcg   Hx Home Orthostasis/hypotension: -- Hold home midodrine  -- Monitor vitals   FEN: No fluid (lasix), replete lytes prn, heart healthy carb mod VTE ppx: Warfarin  Code Status: DNR   Dispo: Anticipated discharge in approximately 1-2 day(s).   Velna Ochs, MD 11/27/2016, 11:29 AM Pager: [phone]

## 2016-11-27 NOTE — Progress Notes (Addendum)
ANTICOAGULATION CONSULT NOTE - Follow-Up  Pharmacy Consult for warfarin Indication: atrial fibrillation  Patient Measurements: Height: 5\' 3"  (160 cm) Weight: 121 lb 14.6 oz (55.3 kg) IBW/kg (Calculated) : 52.4  Vital Signs: Temp: 98.3 F (36.8 C) (04/08 0715) Temp Source: Oral (04/08 0715) BP: 103/71 (04/08 0715) Pulse Rate: 51 (04/08 0715)  Labs:  Recent Labs  11/25/16 0259 11/26/16 0234 11/27/16 0240  HGB 10.7* 11.0*  --   HCT 33.2* 36.3  --   PLT 154 151  --   LABPROT 31.9* 27.6* 24.9*  INR 3.01 2.52 2.21  CREATININE 1.67* 1.71*  --     Estimated Creatinine Clearance: 22.8 mL/min (A) (by C-G formula based on SCr of 1.71 mg/dL (H)).   Assessment: 39 YOF on warfarin PTA for hx Afib. Admit INR 1.95 on PTA dose of 2.5 mg/day EXCEPT for 5 mg on T/Th/Sun.   INR today is now therapeutic (INR 2.21). Significant DDIs with antibiotic therapy (stopped 4/8), solumedrol>prednisone 4/8. Variable PO intake. Hgb trending up, PLTC wnl - no s/sx of bleeding noted at this time.   Goal of Therapy:  INR 2-3 Monitor platelets by anticoagulation protocol: Yes   Plan:  1. Warfarin 5 mg x1 today (home dose) 2. Daily INR, CBC 3. Monitor for s/sx bleeding, PO intake, new DDI, clinical picture  Carlean Jews, Pharm.D. PGY1 Pharmacy Resident 4/8/201811:20 AM Pager [phone]

## 2016-11-27 NOTE — Discharge Instructions (Signed)
Information on my medicine - Coumadin   (Warfarin)  This medication education was reviewed with me or my healthcare representative as part of my discharge preparation.  The pharmacist that spoke with me during my hospital stay was:  Carlean Jews, [facility]  Why was Coumadin prescribed for you? Coumadin was prescribed for you because you have a blood clot or a medical condition that can cause an increased risk of forming blood clots. Blood clots can cause serious health problems by blocking the flow of blood to the heart, lung, or brain. Coumadin can prevent harmful blood clots from forming. As a reminder your indication for Coumadin is:   Stroke Prevention Because Of Atrial Fibrillation  What test will check on my response to Coumadin? While on Coumadin (warfarin) you will need to have an INR test regularly to ensure that your dose is keeping you in the desired range. The INR (international normalized ratio) number is calculated from the result of the laboratory test called prothrombin time (PT).  If an INR APPOINTMENT HAS NOT ALREADY BEEN MADE FOR YOU please schedule an appointment to have this lab work done by your health care provider within 7 days. Your INR goal is usually a number between:  2 to 3 or your provider may give you a more narrow range like 2-2.5.  Ask your health care provider during an office visit what your goal INR is.  What  do you need to  know  About  COUMADIN? Take Coumadin (warfarin) exactly as prescribed by your healthcare provider about the same time each day.  DO NOT stop taking without talking to the doctor who prescribed the medication.  Stopping without other blood clot prevention medication to take the place of Coumadin may increase your risk of developing a new clot or stroke.  Get refills before you run out.  What do you do if you miss a dose? If you miss a dose, take it as soon as you remember on the same day then continue your regularly scheduled regimen the next  day.  Do not take two doses of Coumadin at the same time.  Important Safety Information A possible side effect of Coumadin (Warfarin) is an increased risk of bleeding. You should call your healthcare provider right away if you experience any of the following: ? Bleeding from an injury or your nose that does not stop. ? Unusual colored urine (red or dark brown) or unusual colored stools (red or black). ? Unusual bruising for unknown reasons. ? A serious fall or if you hit your head (even if there is no bleeding).  Some foods or medicines interact with Coumadin (warfarin) and might alter your response to warfarin. To help avoid this: ? Eat a balanced diet, maintaining a consistent amount of Vitamin K. ? Notify your provider about major diet changes you plan to make. ? Avoid alcohol or limit your intake to 1 drink for women and 2 drinks for men per day. (1 drink is 5 oz. wine, 12 oz. beer, or 1.5 oz. liquor.)  Make sure that ANY health care provider who prescribes medication for you knows that you are taking Coumadin (warfarin).  Also make sure the healthcare provider who is monitoring your Coumadin knows when you have started a new medication including herbals and non-prescription products.  Coumadin (Warfarin)  Major Drug Interactions  Increased Warfarin Effect Decreased Warfarin Effect  Alcohol (large quantities) Antibiotics (esp. Septra/Bactrim, Flagyl, Cipro) Amiodarone (Cordarone) Aspirin (ASA) Cimetidine (Tagamet) Megestrol (Megace) NSAIDs (ibuprofen,  naproxen, etc.) °Piroxicam (Feldene) °Propafenone (Rythmol SR) °Propranolol (Inderal) °Isoniazid (INH) °Posaconazole (Noxafil) Barbiturates (Phenobarbital) °Carbamazepine (Tegretol) °Chlordiazepoxide (Librium) °Cholestyramine (Questran) °Griseofulvin °Oral Contraceptives °Rifampin °Sucralfate (Carafate) °Vitamin K  ° °Coumadin® (Warfarin) Major Herbal Interactions  °Increased Warfarin Effect Decreased Warfarin Effect   °Garlic °Ginseng °Ginkgo biloba Coenzyme Q10 °Green tea °St. John’s wort   ° °Coumadin® (Warfarin) FOOD Interactions  °Eat a consistent number of servings per week of foods HIGH in Vitamin K °(1 serving = ½ cup)  °Collards (cooked, or boiled & drained) °Kale (cooked, or boiled & drained) °Mustard greens (cooked, or boiled & drained) °Parsley *serving size only = ¼ cup °Spinach (cooked, or boiled & drained) °Swiss chard (cooked, or boiled & drained) °Turnip greens (cooked, or boiled & drained)  °Eat a consistent number of servings per week of foods MEDIUM-HIGH in Vitamin K °(1 serving = 1 cup)  °Asparagus (cooked, or boiled & drained) °Broccoli (cooked, boiled & drained, or raw & chopped) °Brussel sprouts (cooked, or boiled & drained) *serving size only = ½ cup °Lettuce, raw (green leaf, endive, romaine) °Spinach, raw °Turnip greens, raw & chopped  ° °These websites have more information on Coumadin (warfarin):  www.coumadin.com; °www.ahrq.gov/consumer/coumadin.htm; ° ° ° °

## 2016-11-28 DIAGNOSIS — R8271 Bacteriuria: Secondary | ICD-10-CM

## 2016-11-28 LAB — GLUCOSE, CAPILLARY
GLUCOSE-CAPILLARY: 101 mg/dL — AB (ref 65–99)
GLUCOSE-CAPILLARY: 109 mg/dL — AB (ref 65–99)
GLUCOSE-CAPILLARY: 36 mg/dL — AB (ref 65–99)
GLUCOSE-CAPILLARY: 71 mg/dL (ref 65–99)
Glucose-Capillary: 100 mg/dL — ABNORMAL HIGH (ref 65–99)
Glucose-Capillary: 106 mg/dL — ABNORMAL HIGH (ref 65–99)
Glucose-Capillary: 181 mg/dL — ABNORMAL HIGH (ref 65–99)
Glucose-Capillary: 213 mg/dL — ABNORMAL HIGH (ref 65–99)
Glucose-Capillary: 40 mg/dL — CL (ref 65–99)
Glucose-Capillary: 48 mg/dL — ABNORMAL LOW (ref 65–99)
Glucose-Capillary: 66 mg/dL (ref 65–99)

## 2016-11-28 LAB — CBC
HCT: 33.2 % — ABNORMAL LOW (ref 36.0–46.0)
Hemoglobin: 10.3 g/dL — ABNORMAL LOW (ref 12.0–15.0)
MCH: 28.8 pg (ref 26.0–34.0)
MCHC: 31 g/dL (ref 30.0–36.0)
MCV: 92.7 fL (ref 78.0–100.0)
Platelets: 127 10*3/uL — ABNORMAL LOW (ref 150–400)
RBC: 3.58 MIL/uL — AB (ref 3.87–5.11)
RDW: 15.2 % (ref 11.5–15.5)
WBC: 3.1 10*3/uL — AB (ref 4.0–10.5)

## 2016-11-28 LAB — BASIC METABOLIC PANEL
Anion gap: 11 (ref 5–15)
BUN: 49 mg/dL — AB (ref 6–20)
CO2: 31 mmol/L (ref 22–32)
CREATININE: 1.52 mg/dL — AB (ref 0.44–1.00)
Calcium: 8.7 mg/dL — ABNORMAL LOW (ref 8.9–10.3)
Chloride: 97 mmol/L — ABNORMAL LOW (ref 101–111)
GFR, EST AFRICAN AMERICAN: 37 mL/min — AB (ref >60)
GFR, EST NON AFRICAN AMERICAN: 32 mL/min — AB (ref >60)
Glucose, Bld: 205 mg/dL — ABNORMAL HIGH (ref 65–99)
Potassium: 3.6 mmol/L (ref 3.5–5.1)
SODIUM: 139 mmol/L (ref 135–145)

## 2016-11-28 LAB — PROTIME-INR
INR: 2.54
Prothrombin Time: 27.8 seconds — ABNORMAL HIGH (ref 11.4–15.2)

## 2016-11-28 MED ORDER — DEXTROSE 50 % IV SOLN
INTRAVENOUS | Status: AC
  Administered 2016-11-28: 50 mL
  Filled 2016-11-28: qty 50

## 2016-11-28 MED ORDER — INSULIN GLARGINE 100 UNIT/ML ~~LOC~~ SOLN
6.0000 [IU] | Freq: Every day | SUBCUTANEOUS | Status: DC
  Filled 2016-11-28: qty 0.06

## 2016-11-28 MED ORDER — WARFARIN SODIUM 5 MG PO TABS
2.5000 mg | ORAL_TABLET | Freq: Once | ORAL | Status: AC
  Administered 2016-11-28: 2.5 mg via ORAL
  Filled 2016-11-28: qty 1

## 2016-11-28 MED ORDER — PANTOPRAZOLE SODIUM 40 MG PO TBEC
40.0000 mg | DELAYED_RELEASE_TABLET | Freq: Every day | ORAL | Status: DC
  Administered 2016-11-28 – 2016-12-02 (×5): 40 mg via ORAL
  Filled 2016-11-28 (×5): qty 1

## 2016-11-28 NOTE — Plan of Care (Signed)
Problem: Bowel/Gastric: Goal: Will not experience complications related to bowel motility Outcome: Progressing LBM 11/28/16.

## 2016-11-28 NOTE — Progress Notes (Signed)
I was paged by the RN that the patient had asymptomatic hypoglycemia with a blood glucose of 36. She was encouraged to drink orange juice and repeat capillary glucose improved to 40. At this time the nurse was going to give the patient an amp of D50 but her IV infiltrated and she was unable to give this. She continued to encourage by mouth intake and on repeat capillary blood glucose the patient's blood sugar had risen to 104. She was asymptomatic during this time. The RN will continue to monitor overnight for further episodes of hypoglycemia.

## 2016-11-28 NOTE — Progress Notes (Signed)
Patient unable to ambulate at this time. O2 sats dropped to 86% while on RA while resting in bed.  Joellen Jersey, RN.

## 2016-11-28 NOTE — Progress Notes (Signed)
ANTICOAGULATION CONSULT NOTE - Follow-Up  Pharmacy Consult for warfarin Indication: atrial fibrillation  Patient Measurements: Height: 5\' 3"  (160 cm) Weight: 123 lb 7.3 oz (56 kg) IBW/kg (Calculated) : 52.4  Vital Signs: Temp: 98.2 F (36.8 C) (04/09 0730) Temp Source: Axillary (04/09 0730) BP: 115/68 (04/09 0325) Pulse Rate: 59 (04/09 0325)  Assessment: 75 YOF on Coumadin 2.5mg  daily exc for 5mg  on Sun/Tues/Thurs PTA for hx Afib. INR therapeutic on admission. INR this morning remains therapeutic at 2.54. Hgb 10.3, plts low at 127.  Goal of Therapy:  INR 2-3 Monitor platelets by anticoagulation protocol: Yes   Plan:  Give Coumadin 2.5mg  PO x 1 tonight Monitor daily INR, CBC, s/s of bleed  Elenor Quinones, PharmD, [facility] Clinical Pharmacist Pager [phone] 11/28/2016 7:46 AM

## 2016-11-28 NOTE — Progress Notes (Signed)
IMTS team at bedside this AM. Informed them of patient's low CBG's during the night and this AM. Stated they would address it.  Joellen Jersey, RN

## 2016-11-28 NOTE — Progress Notes (Signed)
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 Could not perform OOB activity with prosthesis due to incontinence of urine and stool saturating through fitting socks for devices. Spoke with case manager who states patient family has flu and therefore can not give Supervision/Assistance - 24 hour.   Follow Up Recommendations  SNF (per case mgr, pt family has flu and can not care for pt )     Equipment Recommendations  None recommended by PT    Recommendations for Other Services       Precautions / Restrictions Precautions Precautions: Fall Precaution Comments: bilateral LEs braces/prosthesis devices that require assist for donning Restrictions Weight Bearing Restrictions: No    Mobility  Bed Mobility Overal bed mobility: Needs Assistance Bed Mobility: Rolling Rolling: Min assist         General bed mobility comments: min assist to roll bilateral directions with cues for positioning  Transfers                    Ambulation/Gait                 Stairs            Wheelchair Mobility    Modified Rankin (Stroke Patients Only)       Balance                                            Cognition Arousal/Alertness: Awake/alert Behavior During Therapy: Flat affect Overall Cognitive Status: No family/caregiver present to determine baseline cognitive functioning                                        Exercises      General Comments   Patient with decreased ability to participate with therapy today due to fatigue and pain.       Pertinent Vitals/Pain Pain Assessment: 0-10 Pain Score: 6  Pain Location: sacrum Pain Descriptors / Indicators: Discomfort;Sore Pain Intervention(s): Monitored during session    Home Living                      Prior Function            PT Goals (current goals can now be found in the care plan section) Acute Rehab PT Goals Patient Stated Goal: to feel better PT Goal Formulation: With patient Time For Goal Achievement: 12/11/16 Potential to Achieve Goals: Good Progress towards PT goals: Progressing toward goals    Frequency    Min 3X/week      PT Plan Discharge plan needs to be updated    Co-evaluation             End of Session Equipment Utilized During Treatment:  Oxygen Activity Tolerance: Treatment limited secondary to medical complications (Comment) (incontinence) Patient left: in bed;with call bell/phone within reach;with nursing/sitter in room Nurse Communication: Mobility status (incontinence ) PT Visit Diagnosis: Muscle weakness (generalized) (M62.81)     Time: 5051-8335 PT Time Calculation (min) (ACUTE ONLY): 20 min  Charges:  $Therapeutic Activity: 8-22 mins                    G Codes:       Alben Deeds, PT DPT  [phone]    Duncan Dull 11/28/2016, 12:08 PM

## 2016-11-28 NOTE — Progress Notes (Addendum)
Subjective: Patient appears clinically worse today. She has increased cough and is now dyspneic at rest. Increased oxygen requirement, patient is hypoxic to 88 on 3 L.   Objective:  Vital signs in last 24 hours: Vitals:   11/28/16 0324 11/28/16 0325 11/28/16 0500 11/28/16 0730  BP:  115/68    Pulse: (!) 58 (!) 59    Resp: 15 17    Temp: 97.8 F (36.6 C) 97.8 F (36.6 C)  98.2 F (36.8 C)  TempSrc: Oral Oral  Axillary  SpO2: 93% 95%    Weight:   123 lb 7.3 oz (56 kg)   Height:        Physical Exam: Constitutional: Frail, ill appearing lady in NAD HEENT: Atraumatic, normocephalic. PERRL, anicteric sclera. Left temple area with healing biopsy site  Neck: Supple, trachea midline.  Cardiovascular:RRR, ausculation limited due to loud rhonchorous breath sounds  Pulmonary/Chest: Decreased breaths sounds at bilateral bases (R worse than L), crackles bilaterally. Patient is hypoxic on 3L, dyspneic at rest.  Extremities: Left BKA, Right lower extremity with pulses intact, warm and well perfused, no edema  Neurological:A&Ox3, CN II - XII grossly intact.   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   Acute on Chronic Hypoxic Respiratory Failure: Secondary to dCHF exacerbation and pulmonary edema. Patient has chronic pleural effusions for which she follows with outpatient pulmonology and receives frequent thoracentesis. On admission, patient was started on gentle fluids due to sepsis and concern for AKI, unfortunately developed worsening pulmonary edema and progression of her pleural effusion. She is now s/p right sided  thoracentesis (-1L) on 4/6 and fluid analysis was consistent with transudative process (cultures NGTD). Home torsemide was restarted and she has been diuresing well. Unfortunately patient is persistently requiring 3-4L [place] to maintain oxygen saturations. Today, she has increased work of breathing and appears dyspneic at rest. Aggressive diuresis is limited due to her renal transplant and new renal dysfunction. Repeat thoracentesis puts patient at increased risk for pneumothorax as well as a nidus for infection in an already frail, immunocompromised host. Patient may benefit from a pleurx catheter in the right hemithorax for more long term management of her effusion. Will defer this decision to her pulmonologist. Plan to continue with current torsemide dose and discharge to SNF possibly tomorrow with close outpatient pulm follow up. Patient will need at least temporary home oxygen.  -- Continue home torsemide 20mg  daily  -- I/O, daily weights  -- f/u pleural fluid cultures >>no growth x 3 days   Influenza B Pneumonia: Now resolved, s/p 5 day course of renally dosed tamiflu and cefepime. Also s/p 3 days of stress dose steroids. Hospital course complicated by progression of chronic pulmonary edema/pleural effusion (see above)    Asymptomatic Bacteriuria:Patient denies symptoms of dysuria but UA was checked on admission due to her fever and chronic urinary incontinence. UA was completely negative but UCx grew 80,000 colonies of enterococcus faecium. Patient has been afebrile and is clinically improving. Will monitor for now.   Hx Renal Transplant:Creatinine elevated to 1.9 on admission. Unclear baseline, last creatinine in our system was 1.2 two years ago. S/p 1 L NS  in the ED and 75 cc NS x 48 hours on admission. She subsequently developedplum edema and worsening respiratory status. S/p IV lasix 40 mg x 1, home torsemide was resumed. Renal function has remained stable.  -- Continue home prograf,  mycophenolate,and prednisone 5 mg daily  -- Follow AM BMP   Type II DM:Takes Lantus 8 units and SSI with meals at home. Started on 6 units QHS and S-SSI on admission but CBGs were uncontrolled. Regimen increased now with better control.  -- Decrease Lantus QHS to 6 units  -- S-SSI TID w/ meals + HS scale   Hx Paroxsymal Atrial Fibrillation: Not currently on any rate or rhythm control medications. Bradycardic and regular this admission. -- Continue home warfarin   Hx Iron Deficiency Anemia: -- Continue home iron supplements  Hx Hypothyroidism: S/p thyroidectomy  -- Continue home Synthroid 112 mcg   Hx Home Orthostasis/hypotension: -- Hold home midodrine  -- Monitor vitals   FEN: No fluid (torsemide), replete lytes prn, heart healthy carb mod VTE ppx: Warfarin  Code Status: DNR   Dispo: Anticipated discharge in approximately 1-2 day(s).   Velna Ochs, MD 11/28/2016, 7:41 AM Pager: [phone]

## 2016-11-28 NOTE — Progress Notes (Signed)
Patient with worsening confusion today. Shouting out that someone's shaking her in the bed and having hallucinations of someone being in her room. Dr. Posey Pronto notified.   Joellen Jersey, RN.

## 2016-11-28 NOTE — Progress Notes (Signed)
Medicine attending: I examined this patient this morning and I concur with the evaluation and management plan as recorded by resident physician Dr. Candace Cruise. She remains deconditioned.  Dyspneic at rest on oxygen.  Oxygen saturation 94% on 2.5 L nasal cannula oxygen.  Scattered rhonchi left lung.  Persistent right pleural effusion. Lab profile stable.  Stable leukopenia white count 3100.  Afebrile off antibiotics.  Warfarin level remains therapeutic at current dose.  Husband contracted the flu from her and is sick at home.  Best disposition for this patient would be temporary extended care facility for rehab.  She is already physically handicapped with a left BKA.  She will need oxygen which will be an additional handicap.  We will ask our care managers to assist with discharge planning.

## 2016-11-28 NOTE — Progress Notes (Signed)
Pt CBG 40, gave OJ, recheck it CBG 36, gave another OJ, retrieved Dextrose and IV was infiltrated, another IV place and rechecked CBG 101, didn't give Dextrose. Paged MD to notify of critical value, waiting on response.  MD Lovena Le responded back, I informed him of situation and outcome and will recheck CBG q2 until morining.

## 2016-11-29 ENCOUNTER — Inpatient Hospital Stay (HOSPITAL_COMMUNITY): Payer: Medicare Other

## 2016-11-29 DIAGNOSIS — R413 Other amnesia: Secondary | ICD-10-CM

## 2016-11-29 DIAGNOSIS — R627 Adult failure to thrive: Secondary | ICD-10-CM

## 2016-11-29 DIAGNOSIS — R410 Disorientation, unspecified: Secondary | ICD-10-CM

## 2016-11-29 DIAGNOSIS — R0902 Hypoxemia: Secondary | ICD-10-CM | POA: Diagnosis present

## 2016-11-29 LAB — GLUCOSE, CAPILLARY
GLUCOSE-CAPILLARY: 164 mg/dL — AB (ref 65–99)
GLUCOSE-CAPILLARY: 61 mg/dL — AB (ref 65–99)
GLUCOSE-CAPILLARY: 92 mg/dL (ref 65–99)
Glucose-Capillary: 108 mg/dL — ABNORMAL HIGH (ref 65–99)
Glucose-Capillary: 188 mg/dL — ABNORMAL HIGH (ref 65–99)

## 2016-11-29 LAB — CBC
HEMATOCRIT: 36.1 % (ref 36.0–46.0)
Hemoglobin: 11.2 g/dL — ABNORMAL LOW (ref 12.0–15.0)
MCH: 29.8 pg (ref 26.0–34.0)
MCHC: 31 g/dL (ref 30.0–36.0)
MCV: 96 fL (ref 78.0–100.0)
PLATELETS: 139 10*3/uL — AB (ref 150–400)
RBC: 3.76 MIL/uL — AB (ref 3.87–5.11)
RDW: 15.4 % (ref 11.5–15.5)
WBC: 3.9 10*3/uL — AB (ref 4.0–10.5)

## 2016-11-29 LAB — BASIC METABOLIC PANEL
ANION GAP: 13 (ref 5–15)
BUN: 46 mg/dL — AB (ref 6–20)
CO2: 34 mmol/L — AB (ref 22–32)
Calcium: 9 mg/dL (ref 8.9–10.3)
Chloride: 96 mmol/L — ABNORMAL LOW (ref 101–111)
Creatinine, Ser: 1.62 mg/dL — ABNORMAL HIGH (ref 0.44–1.00)
GFR calc Af Amer: 34 mL/min — ABNORMAL LOW (ref >60)
GFR calc non Af Amer: 30 mL/min — ABNORMAL LOW (ref >60)
GLUCOSE: 201 mg/dL — AB (ref 65–99)
POTASSIUM: 4.5 mmol/L (ref 3.5–5.1)
Sodium: 143 mmol/L (ref 135–145)

## 2016-11-29 LAB — CULTURE, BODY FLUID W GRAM STAIN -BOTTLE: Culture: NO GROWTH

## 2016-11-29 LAB — BRAIN NATRIURETIC PEPTIDE: B NATRIURETIC PEPTIDE 5: 313.8 pg/mL — AB (ref 0.0–100.0)

## 2016-11-29 LAB — CULTURE, BODY FLUID-BOTTLE

## 2016-11-29 LAB — PROTIME-INR
INR: 2.42
Prothrombin Time: 26.8 seconds — ABNORMAL HIGH (ref 11.4–15.2)

## 2016-11-29 MED ORDER — WARFARIN SODIUM 5 MG PO TABS
5.0000 mg | ORAL_TABLET | Freq: Once | ORAL | Status: AC
  Administered 2016-11-29: 5 mg via ORAL
  Filled 2016-11-29: qty 1

## 2016-11-29 MED ORDER — WHITE PETROLATUM GEL
Status: AC
  Filled 2016-11-29: qty 1

## 2016-11-29 NOTE — Progress Notes (Addendum)
Nutrition Consult / Follow-up  DOCUMENTATION CODES:   Severe malnutrition in context of acute illness/injury  INTERVENTION:    Chocolate Glucerna Shake po TID, each supplement provides 220 kcal and 10 grams of protein  NUTRITION DIAGNOSIS:   Malnutrition (severe) related to acute illness (flu B, PNA) as evidenced by moderate depletions of muscle mass, percent weight loss (10% weight loss within one month).  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   Patient reports good intake, no weight loss PTA, however her usual weight is 131 lbs and she is now 119 lbs. Question accuracy of information patient provided. Noted recent confusion. She said she likes chocolate Ensure supplements. PO intake variable, 0-100% meal completion. Nutrition-Focused physical exam completed. Findings are no fat depletion, moderate muscle depletion, and no edema.  Patient with 10% weight loss within the past month. Labs reviewed. CBG's: 108-188 Medications reviewed and include vitamin D, ferrous sulfate, KCl.  Diet Order:  Diet heart healthy/carb modified Room service appropriate? Yes; Fluid consistency: Thin  Skin:  Reviewed, no issues  Last BM:  4/3  Height:   Ht Readings from Last 1 Encounters:  11/24/16 5\' 3"  (1.6 m)    Weight:   Wt Readings from Last 1 Encounters:  11/29/16 119 lb 4.3 oz (54.1  kg)    Ideal Body Weight:  48.9 kg  BMI:  Body mass index is 21.13 kg/m.  Estimated Nutritional Needs:   Kcal:  1650-1850  Protein:  70-80 grams  Fluid:  1.6 - 1.8 L/day  EDUCATION NEEDS:   No education needs identified at this time  Molli Barrows, [place], [place], [place] Pager [phone] After Hours Pager [phone]

## 2016-11-29 NOTE — Progress Notes (Signed)
Patient was previously on 3L O2 n/c. Notified by Elink that O2 sats decreased to 60's and were sustaining. Upon entering pt's room she was noted to be sustaining O2 sats in the low 60's with a good waveform. Patient alert, not appearing to be in distress. Attempted to increase n/c flow, but without success. Patient then placed on NRB and O2 sats increased to >95%. Bilateral lung sounds are more wet/congested than previous assessment. Dr. Dorna Mai notified. Red, raised rash noted on back when MD arrived to unit. New orders received.   Joellen Jersey, RN.

## 2016-11-29 NOTE — Progress Notes (Signed)
Medicine attending: I personally examined this patient today and I concur with the evaluation and management plan as recorded by resident physician Dr. Candace Cruise which we discussed together. Her condition remains tenuous.  Intermittent hypoxia.  Failure to thrive.  Intermittent confusion over the last 24-48 hours.  She is oriented to person and place.  Poor memory.  No focal deficits.  Maintaining oxygen saturation at or above 93% but now requiring 4 L nasal cannula oxygen supplement.  She remains afebrile off antibiotics for the last 24 hours.  We are going to check a BNP.  Although nonspecific, and may give some idea of contribution of heart failure to her symptomatology.  Most recent checked on February 14 was 394.  Repeat chest radiograph today which I personally reviewed, overall stable compared with previous with suspected bilateral effusions right greater than left, bilateral lower lobe atelectasis, chronic cardiomegaly. Patient and her husband have been very realistic about her condition.  We may be reaching the limit of what we can do medically to stabilize her.  Plan to discharge to skilled nursing facility.  She is on stable to go back to her home situation at present.

## 2016-11-29 NOTE — Progress Notes (Addendum)
Hypoglycemic Event  CBG: 61  Treatment: 15 GM carbohydrate snack (orange juice)  Symptoms: Shaky and Nervous/irritable  Follow-up CBG: Time: 2130 CBG Result: 92  Possible Reasons for Event: Inadequate meal intake  Comments/MD notified:None     Charmayne Sheer

## 2016-11-29 NOTE — Progress Notes (Signed)
ANTICOAGULATION CONSULT NOTE - Follow-Up  Pharmacy Consult for warfarin Indication: atrial fibrillation  Patient Measurements: Height: 5\' 3"  (160 cm) Weight: 119 lb 4.3 oz (54.1 kg) IBW/kg (Calculated) : 52.4  Vital Signs: Temp: 97.6 F (36.4 C) (04/10 0249) Temp Source: Oral (04/10 0249) BP: 134/48 (04/10 0249) Pulse Rate: 66 (04/10 0249)  Assessment: 75 YOF on Coumadin 2.5mg  daily exc for 5mg  on Sun/Tues/Thurs PTA for hx Afib. INR therapeutic on admission. INR this morning remains therapeutic at 2.42. Hgb 10.3, plts low at 127.  Goal of Therapy:  INR 2-3 Monitor platelets by anticoagulation protocol: Yes   Plan:  Give Coumadin 5mg  PO x 1 tonight Monitor daily INR, CBC, s/s of bleed  Elenor Quinones, PharmD, [facility] Clinical Pharmacist Pager [phone] 11/29/2016 7:41 AM

## 2016-11-29 NOTE — Progress Notes (Signed)
Subjective: Patient has no complaints this morning, however clinically is still dyspneic at rest. She continues to experience hypoglycemic episodes on her home Lantus, which was held yesterday evening. Per report, she has had very poor PO intake and she reports poor appetite. She has also been intermittently confused, reportedly hallucinating yesterday evening. She is mildly confused this morning (oriented to self but not place) and slow to respond. I was paged by RN shortly after rounds that the patient had a hypoxic episode to the 60s on 3 L with good waveform. Patient was transitioned to a nonrebreather and oxygenation has now improved. This most recent hypoxic episode occurred shortly after patient had eaten breakfast. No reported choking or gagging.   Objective:  Vital signs in last 24 hours: Vitals:   11/29/16 0745 11/29/16 0920 11/29/16 1051 11/29/16 1058  BP: 127/89     Pulse: 62     Resp:      Temp: 98.4 F (36.9 C)     TempSrc: Oral     SpO2:  99% (!) 66% 94%  Weight:      Height:        Physical Exam: Constitutional: Frail, ill appearing lady in NAD HEENT: Atraumatic, normocephalic. PERRL, anicteric sclera. Left temple area with healing biopsy site  Neck: Supple, trachea midline.  Cardiovascular: Irregular rhythm, regular rate Pulmonary/Chest: Decreased breaths sounds at bilateral bases (R worse than L), crackles bilaterally. Very poor inspiratory effort. Currently sating 100% on non re breather but dyspneic at rest.  Extremities: Left BKA, Right lower extremity with pulses intact, warm and well perfused, no edema  Neurological:A&Ox3, CN II - XII grossly intact.    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   Acute on Chronic Hypoxic Respiratory Failure: Secondary to dCHF exacerbation and pulmonary edema. Patient has chronic pleural effusions for which she follows with outpatient pulmonology and receives frequent thoracentesis. On admission, patient was started on gentle fluids due to sepsis and concern for AKI, unfortunately developed worsening pulmonary edema and progression of her pleural effusion. She is now s/p right sided thoracentesis (-1L) on 4/6 and fluid analysis was consistent with transudative process (cultures NGTD). Home torsemide was restarted and she diuresed well. Unfortunately aggressive diuresis is limited due to her renal transplant and renal dysfunction, weight is down about 6lbs since admission and torsemide was held today. Repeat thoracentesis puts patient at increased risk for pneumothorax as well as a nidus for infection in an already frail, immunocompromised host. She may benefit from a pleurx catheter in the right hemithorax for more long term management of her effusion, however, clinical status is currently tenuous and this ideally should be deferred to her outpatient pulmonologist. Overall, patient is very deconditioned and not doing well clincially. Treatment options are limited (diuretics, repeat thoracentesis). She is DNR and she has expressed her wishes against intubation. Patient and husband would likely benefit from a goals of care conversation as palliative measures may be more reasonable.  -- Hold home torsemide 20mg  daily  -- I/O, daily weights  -- f/u pleural fluid cultures >>no growth x 5 days  -- BiPAP prn to maintain oxygen > 92% --  STAT CXR  -- Will update husband   Type II DM:Takes Lantus 8 units and SSI with meals at home. Started on 6 units QHS and S-SSI on admission but CBGs were uncontrolled. Regimen increased to home dose, however patient is now  experiencign poor po intake failure to thrive. Will hold lantus for now, continue with SSI.  -- Hold Lantus -- S-SSI TID w/ meals + HS scale   Influenza B Pneumonia: Resolved, s/p 5 days of tamiflu and cefepime.  Asymptomatic Bacteriuria:Patient denies symptoms of dysuria but UA was checked on admission due to her fever and chronic urinary incontinence. UA was completely negative but UCx grew 80,000 colonies of enterococcus faecium. Patient has been afebrile and is clinically improving. Will monitor for now.   Hx Renal Transplant:Creatinine elevated to 1.9 on admission. Unclear baseline, last creatinine in our system was 1.2 two years ago. S/p 1 L NS in the ED and 75 cc NS x 48 hours on admission. She subsequently developedplum edema and worsening respiratory status. S/p IV lasix 40 mg x 1, home torsemide was resumed. Renal function has remained stable. -- Continue home prograf, mycophenolate,and prednisone 5 mg daily  -- Follow AM BMP   Hx Paroxsymal Atrial Fibrillation: Not currently on any rate or rhythm control medications. Bradycardic and regular this admission. -- Continue home warfarin   Hx Iron Deficiency Anemia: -- Continue home iron supplements  Hx Hypothyroidism: S/p thyroidectomy  -- Continue home Synthroid 112 mcg   Hx Home Orthostasis/hypotension: -- Hold home midodrine  -- Monitor vitals   FEN: No fluid (torsemide), replete lytes prn, heart healthy carb mod VTE ppx: Warfarin  Code Status: DNR    Dispo: Anticipated discharge unclear, clinical status is tenuous. Needs goals of care discussion.   Velna Ochs, MD 11/29/2016, 11:26 AM Pager: [phone]

## 2016-11-29 NOTE — Progress Notes (Signed)
Pt seen, no increased wob or respiratory distress noted or voiced by pt at this time.  Pt found on 4lnc, HR55, rr22, spo2 95%, bipap not indicated at this time.  RT will monitor and assess as needed.

## 2016-11-30 DIAGNOSIS — I5033 Acute on chronic diastolic (congestive) heart failure: Secondary | ICD-10-CM

## 2016-11-30 DIAGNOSIS — N184 Chronic kidney disease, stage 4 (severe): Secondary | ICD-10-CM

## 2016-11-30 DIAGNOSIS — J9620 Acute and chronic respiratory failure, unspecified whether with hypoxia or hypercapnia: Secondary | ICD-10-CM | POA: Diagnosis present

## 2016-11-30 DIAGNOSIS — J90 Pleural effusion, not elsewhere classified: Secondary | ICD-10-CM

## 2016-11-30 LAB — URINALYSIS, ROUTINE W REFLEX MICROSCOPIC
Bilirubin Urine: NEGATIVE
Glucose, UA: 50 mg/dL — AB
Ketones, ur: NEGATIVE mg/dL
LEUKOCYTES UA: NEGATIVE
NITRITE: NEGATIVE
PROTEIN: NEGATIVE mg/dL
Specific Gravity, Urine: 1.017 (ref 1.005–1.030)
Squamous Epithelial / LPF: NONE SEEN
pH: 5 (ref 5.0–8.0)

## 2016-11-30 LAB — CBC
HEMATOCRIT: 33.7 % — AB (ref 36.0–46.0)
HEMOGLOBIN: 10.7 g/dL — AB (ref 12.0–15.0)
MCH: 29.6 pg (ref 26.0–34.0)
MCHC: 31.8 g/dL (ref 30.0–36.0)
MCV: 93.1 fL (ref 78.0–100.0)
Platelets: 117 10*3/uL — ABNORMAL LOW (ref 150–400)
RBC: 3.62 MIL/uL — AB (ref 3.87–5.11)
RDW: 15.5 % (ref 11.5–15.5)
WBC: 2.9 10*3/uL — ABNORMAL LOW (ref 4.0–10.5)

## 2016-11-30 LAB — BASIC METABOLIC PANEL
Anion gap: 8 (ref 5–15)
BUN: 44 mg/dL — ABNORMAL HIGH (ref 6–20)
CHLORIDE: 97 mmol/L — AB (ref 101–111)
CO2: 33 mmol/L — ABNORMAL HIGH (ref 22–32)
Calcium: 8.7 mg/dL — ABNORMAL LOW (ref 8.9–10.3)
Creatinine, Ser: 1.46 mg/dL — ABNORMAL HIGH (ref 0.44–1.00)
GFR calc non Af Amer: 33 mL/min — ABNORMAL LOW (ref >60)
GFR, EST AFRICAN AMERICAN: 39 mL/min — AB (ref >60)
Glucose, Bld: 212 mg/dL — ABNORMAL HIGH (ref 65–99)
POTASSIUM: 4.1 mmol/L (ref 3.5–5.1)
SODIUM: 138 mmol/L (ref 135–145)

## 2016-11-30 LAB — PROTIME-INR
INR: 2.51
PROTHROMBIN TIME: 27.5 s — AB (ref 11.4–15.2)

## 2016-11-30 LAB — GLUCOSE, CAPILLARY
GLUCOSE-CAPILLARY: 240 mg/dL — AB (ref 65–99)
GLUCOSE-CAPILLARY: 163 mg/dL — AB (ref 65–99)
GLUCOSE-CAPILLARY: 103 mg/dL — AB (ref 65–99)
Glucose-Capillary: 241 mg/dL — ABNORMAL HIGH (ref 65–99)

## 2016-11-30 MED ORDER — WARFARIN SODIUM 5 MG PO TABS
2.5000 mg | ORAL_TABLET | Freq: Once | ORAL | Status: AC
  Administered 2016-11-30: 2.5 mg via ORAL
  Filled 2016-11-30: qty 1

## 2016-11-30 MED ORDER — FLUCONAZOLE 150 MG PO TABS
150.0000 mg | ORAL_TABLET | Freq: Once | ORAL | Status: AC
  Administered 2016-11-30: 150 mg via ORAL
  Filled 2016-11-30: qty 1

## 2016-11-30 NOTE — Progress Notes (Signed)
Medicine attending: I examined this patient today and I tested the accuracy of the evaluation and management plan as recorded by resident physician Dr. Rufina Falco. Overall status continues to fluctuate mostly correlated with fluctuations in her oxygenation. Intermittent confusion when she desaturates. At the present time with oxygen saturations 100%, she is alert, oriented, continue me the names of all of her 5 grandchildren which she was not able to do yesterday. Physical exam otherwise unchanged. Chronic persistent right pleural effusion. Scattered rhonchi in the left lung with poor respiratory effort. No JVD. Regular cardiac rhythm. No peripheral edema.  Impression: #1. Influenza B, now treated, in an elderly, immunocompromised patient on chronic steroids and MMF. #2. Chronic diastolic heart failure with persistent right transudate pleural effusion. #3. Ongoing multifactorial hypoxia. BNP only moderately elevated. No major improvement in oxygenation after removal of a liter of right pleural fluid. No persistent or progressive changes on chest radiograph. #4. CK D4 status post renal transplant on chronic immunosuppressive drugs #5. Chronic leukopenia related to #4. #6. Failure to thrive secondary to problems listed above and deconditioning.  She is severely deconditioned. We have not been able to determine any additional component of her illness that we can reverse. We discussed the possibility of pulmonary medicine evaluation to assess the risk versus benefit of placing a right pleural drainage catheter. Condition remains unstable at present for discharge.

## 2016-11-30 NOTE — Consult Note (Signed)
Patient name: Elizabeth [name] Medical record number: [id number] Date of birth: February 02, 1941 Age: 76 y.o. Gender: female PCP: Donetta Potts, MD  Date: 11/30/2016 Reason for Consult: hypoxemia  Referring Physician: Dr. Philipp Ovens   HPI: 76 yo F with PMH recurrent transudative right pulmonary effusion, chronic diastolic CHF, cadaveric kidney transplant 2001 on immunosuppression with tacrolimus and mycophenolate, hypertension, hyperlipidemia, type 2 diabetes, CVA, paroxysmal a-fib (on warfarin), upper extremity DVT, PVD s/p right BKA, and ESRD dt diabetic nephropathy presented with 1 week of cough. In October she was diagnosed with pneumonia and subsequently developed complicated parapneumatic pleural effusion requiring 3 pleurocentesis at [facility] which revealed transudative fluid. Reoccuring effusion is thought to be a result of heart failure, she follows with outpatient cardiology and pulmonology. Her pulmonologist feels that she needs pleurodesis and cardiology has recommended pleurex cath. On 4/3 she presented with 1 week of cough, was found to be flu positive with reoccurrence of the right pleural effusion. Thoracentesis was performed on 4/5 and 1 liter of transudative fluid were drawn off. Diuresis is limited dt her renal transplant and dysfunction but primary team has achieved 6 kg of weight loss. Yesterday morning she had an an episode of sudden desaturation to SpO2 60s on 3 liters, she was transitioned to a non rebreather and oxygen improved. Had another episode of desat yesterday evening to 80s on 4 L [place], improved to SpO2 80-90s on 6 liters.   Past Medical History:  Diagnosis Date  . Adenomatous polyp   . Anemia   . 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 Bradycardia   . Carotid artery stenosis    mild  . Carotid bruit    left  . Chronic kidney disease    s/p cadaveric renal transplant in 2001   . Colitis   . Compression fracture of L4 lumbar vertebra (HCC)   . Coronary artery disease   . CVA (cerebral infarction)   . Diabetes mellitus   . Diabetic retinopathy   . Esophageal candidiasis ([place])   . Flu 11/22/2016  . Fracture of right maxilla 09/21/2012  . Fx ankle   . Fx wrist   . Gastritis   . GERD (gastroesophageal reflux disease)   . H/O immunosuppressive therapy   . History of orthostatic hypotension   . Hyperlipidemia   . Hyperparathyroidism   . Hypothyroidism   . IBS (irritable bowel syndrome)   . Ischemic colitis ([place])   . Lactose intolerance   . Leg ulcer ([place])    from poor fitting prosthetic  . Melanoma ([place])   . Nephropathy   . 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 Neuropathy ([place])   . Osteoporosis    recurrent fractures  . Pancreatic cyst   . Pneumonia 11/2016  . Pulmonary embolism ([place])   . PVD (peripheral vascular disease) ([place])   . Renal artery stenosis ([place])   . 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 Renal osteodystrophy   . Right orbit fracture ([place]) 09/21/2012  . Stroke ([place])   . TBI (traumatic brain injury) ([place]) 09/30/2012  . Tubular adenoma   . Zygoma fracture ([place]) 09/21/2012    Past Surgical History:  Procedure Laterality Date  . ABDOMINAL HYSTERECTOMY    . APPENDECTOMY    . BELOW KNEE LEG AMPUTATION     left, charcott joint from neuropathy  . CATARACT EXTRACTION    . CESAREAN SECTION  x 2  . CHOLECYSTECTOMY    . COLONOSCOPY  01/30/2012   Procedure: COLONOSCOPY;  Surgeon: Milus Banister, MD;  Location: [facility];  Service: Endoscopy;  Laterality: N/A;  . COLONOSCOPY W/ BIOPSIES AND POLYPECTOMY  02/18/2009   adenomatous polyps, diverticulosis, internal hemorrhoids  . CORONARY ARTERY BYPASS GRAFT    . ESOPHAGOGASTRODUODENOSCOPY  05/02/2012   Procedure: ESOPHAGOGASTRODUODENOSCOPY (EGD);  Surgeon: Gatha Mayer, MD;  Location: [facility];  Service: Endoscopy;  Laterality: N/A;  . EUS  02/04/2010   w/FNA, pancreatic  cyst  . IR THORACENTESIS ASP PLEURAL SPACE W/IMG GUIDE  11/24/2016  . KIDNEY TRANSPLANT  08/15/2000   [facility] in [place]  . THORACENTESIS  07/2016, 08/2016, 09/2016   Ordered by Dr. Vella Kohler. Surg: Dr.Green, Dr. Anselm Pancoast  . THYROID SURGERY    . TONSILLECTOMY    . UPPER GASTROINTESTINAL ENDOSCOPY  12/01/2009  . VITRECTOMY     right eye    Family History  Problem Relation Age of Onset  . Stroke Mother 43  . Heart attack Mother   . Heart attack Father 37  . Breast cancer Sister   . Colon cancer Neg Hx   . Diabetes Neg Hx     Social History:  reports that she has never smoked. She has never used smokeless tobacco. She reports that she does not drink alcohol or use drugs.  Allergies:  Allergies  Allergen Reactions  . Azithromycin Other (See Comments)    Upset stomach  . Erythromycin Nausea And Vomiting  . Fentanyl Nausea And Vomiting  . Percocet [Oxycodone-Acetaminophen] Nausea And Vomiting    Medications: I have reviewed the patient's current medications.  Denies chest pain, difficulty breathing, peripheral edema. Positive for decreased appetite and cough.  Temp:  [97.8 F (36.6 C)-98.3 F (36.8 C)] 97.8 F (36.6 C) (04/11 1920) Pulse Rate:  [49-57] 54 (04/11 1920) Resp:  [14-26] 20 (04/11 1920) BP: (113-153)/(33-80) 113/40 (04/11 1920) SpO2:  [78 %-100 %] 94 % (04/11 1920) Weight:  [53.5 kg (117 lb 15.1 oz)] 53.5 kg (117 lb 15.1 oz) (04/11 0349)  Intake/Output Summary (Last 24 hours) at 11/30/16 2006 Last data filed at 11/30/16 1438  Gross per 24 hour  Intake              720 ml  Output                0 ml  Net              720 ml   Physical exam  Constitutional: cachectic, ill appearing woman appears stated age, NAD  Pulmonary: on HFNC, normal work of breathing, actively coughing, right lung decreased air entry, left lung with pulmonary crackles  Cardiac: irregularly irregular rhythm, no murmur  Extremities: left BKA  Neurology: AAOx3, not moving left upper  extremity    LAB RESULTS BMET    Component Value Date/Time   NA 138 11/30/2016 0229   NA 144 08/14/2014 0449   K 4.1 11/30/2016 0229   K 4.1 08/14/2014 0449   CL 97 (L) 11/30/2016 0229   CL 106 08/14/2014 0449   CO2 33 (H) 11/30/2016 0229   CO2 37 (H) 08/14/2014 0449   GLUCOSE 212 (H) 11/30/2016 0229   GLUCOSE 82 08/14/2014 0449   BUN 44 (H) 11/30/2016 0229   BUN 33 (H) 08/14/2014 0449   CREATININE 1.46 (H) 11/30/2016 0229   CREATININE 1.22 08/14/2014 0449   CALCIUM 8.7 (L) 11/30/2016 0229   CALCIUM 8.6 08/14/2014  0449   GFRNONAA 33 (L) 11/30/2016 0229   GFRNONAA 46 (L) 08/14/2014 0449   GFRNONAA >60 05/24/2013 1600   GFRAA 39 (L) 11/30/2016 0229   GFRAA 55 (L) 08/14/2014 0449   GFRAA >60 05/24/2013 1600   CBC    Component Value Date/Time   WBC 2.9 (L) 11/30/2016 0229   RBC 3.62 (L) 11/30/2016 0229   HGB 10.7 (L) 11/30/2016 0229   HGB 10.5 (L) 08/14/2014 0449   HCT 33.7 (L) 11/30/2016 0229   HCT 32.6 (L) 08/14/2014 0449   PLT 117 (L) 11/30/2016 0229   PLT 149 (L) 08/14/2014 0449   MCV 93.1 11/30/2016 0229   MCV 94 08/14/2014 0449   MCH 29.6 11/30/2016 0229   MCHC 31.8 11/30/2016 0229   RDW 15.5 11/30/2016 0229   RDW 15.0 (H) 08/14/2014 0449   LYMPHSABS 0.5 (L) 11/22/2016 0913   LYMPHSABS 1.2 08/14/2014 0449   MONOABS 0.4 11/22/2016 0913   MONOABS 0.4 08/14/2014 0449   EOSABS 0.0 11/22/2016 0913   EOSABS 0.0 08/14/2014 0449   BASOSABS 0.0 11/22/2016 0913   BASOSABS 0.0 08/14/2014 0449   ABG    Component Value Date/Time   PHART 7.337 (L) 11/24/2016 0930   HCO3 27.4 11/24/2016 0930   TCO2 34 11/19/2013 1154   O2SAT 89.8 11/24/2016 0930   Radiology Assessment and plan: Patient Active Problem List   Diagnosis Date Noted  . Acute on chronic respiratory failure ([place]) 11/30/2016  . Hypoxia   . CHF (congestive heart failure) ([place])   . S/P thoracentesis   . Influenza with pneumonia   . CKD (chronic kidney disease) stage 4, GFR 15-29 ml/min (HCC)   .  Chronic anticoagulation   . Community acquired pneumonia of right lower lobe of lung ([place])   . Febrile illness 11/22/2016  . Pleural effusion, right 09/30/2016  . Hyperlipidemia 06/18/2014  . Altered mental status 11/19/2013  . Acute encephalopathy 11/19/2013  . Acute on chronic diastolic heart failure ([place]) 10/21/2013  . History of renal transplant 10/21/2013  . Encounter for therapeutic drug monitoring 09/17/2013  . UTI (lower urinary tract infection) 09/27/2012  . Concussion, unspecified 09/21/2012  . 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 Orthostatic hypotension 06/06/2012  . 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 Constipation 10/11/2011  . Nocturnal polyuria 03/31/2011  . PAF (paroxysmal atrial fibrillation) ([place]) 01/28/2011  . Encounter for anticoagulation discussion and counseling 01/28/2011  . CVA (cerebral vascular accident) ([place]) 01/28/2011  . DVT of upper extremity (deep vein thrombosis) ([place]) 01/28/2011  . Edema 01/10/2011  . SINUS BRADYCARDIA 12/14/2009  . GERD 12/08/2009  . DIZZINESS 05/20/2009  . Type 2 diabetes mellitus with stage 4 chronic kidney disease, without long-term current use of insulin ([place]) 02/11/2009  . HTN (hypertension), benign 02/09/2009  . Coronary artery disease involving native coronary artery of native heart without angina pectoris 02/09/2009  . CAROTID BRUITS, BILATERAL 02/09/2009  . 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 Irritable bowel syndrome 08/18/2008  . COLONIC POLYPS, HX OF 08/18/2008   Recurrent pulmonary effusion 2/2 chronic diastolic CHF  Hx of recurrent transudative pleural effusion requiring thoracentesis x4 since October. Most recent chest xray yesterday shows persistence of pleural effusion despite recent 1 liter thoracentesis 4/5 and improvement in pulmonary vascular congestion. Question if palliative pleurex may be a possibility but it is not the typical  treatment for transudative effusion.  - Primary team has achieved good diuresis, continue as tolerated with her limited kidney function.  -  consider repeat thoracentesis followed by chest CT   Acute on chronic hypoxic respiratory failure  Unclear etiology but may be partially related to pleural effusion causing atelectesis and lung compression.  -continue to attempt wean of O2 as tolerated   Influenza B pneumonia with possible superimposed bacterial pneumonia   S/p 5 day course of tamiflu and cefepime   Attending:  I have seen and examined the patient with nurse practitioner/resident and agree with the note above.  We formulated the plan together and I elicited the following history.    Chart reviewed in detail and discussed with primary service attending and patient and family.  Briefly, she has pulmonary hypertension, diastolic heart failure and a history of a renal transplant.  She had significant pneumonia back in November and had a R pleural effusion which was drained.  Since then she has had repeated thoracentesis, all suggestive of a transudate.  PCCM consulted for management of the same.  She was admitted with influenza and pneumonia and has had fluctuating oxygen requirments.  On exam Diminished RLL, normal effort RRR, no mgr Belly soft, normal effort  CXR images reviewed, multiple, now small to moderate RLL effusion which has waxed an waned.  Transudative pleural effusion: the most likely explanation is that this is related to her pulmonary hypertension and inadequate diuresis.  DDx includes trapped lung but I can;t appreciate findings on CXR that would strongly suggest this.    Transudative effusions don't do well with repeated or continuous drainage from a pleurex because this doesn't address the underlying cause and leads to malnourishment and other complications.  Pleuridesis may help but is painful and again doesn't address the underlying cause.    Would recommend  addressing volume status with cardiology and renal: can we do more for diuresis? Should we get a right heart catheterization to help guide [place]? If we have decided that this cannot be addressed with further diuresis then we could recommend a pleural drainage catheter or pleuridesis for palliative purposes only.  At this point the family is not interested in palliative care so this may be the best approach.  If a RHC shows only mild pressure abnormalities then we may be dealing with trapped lung which would require thoracic surgical evaluation.  PCCM available prn, call if questions  Roselie Awkward, MD [facility] PCCM Pager: [phone] Cell: [phone] After 3pm or if no response, call [phone]

## 2016-11-30 NOTE — Progress Notes (Signed)
ANTICOAGULATION CONSULT NOTE - Follow-Up  Pharmacy Consult for warfarin Indication: atrial fibrillation  Patient Measurements: Height: 5\' 3"  (160 cm) Weight: 117 lb 15.1 oz (53.5 kg) IBW/kg (Calculated) : 52.4  Vital Signs: Temp: 97.8 F (36.6 C) (04/11 0349) Temp Source: Oral (04/11 0349) BP: 128/33 (04/11 0349) Pulse Rate: 51 (04/11 0349)  Assessment: 75 YOF on Coumadin 2.5mg  daily exc for 5mg  on Sun/Tues/Thurs PTA for hx Afib. INR therapeutic on admission. INR this morning remains therapeutic at 2.51. Hgb low but stable at 10.7, plts low at 117.  Goal of Therapy:  INR 2-3 Monitor platelets by anticoagulation protocol: Yes   Plan:  Give Coumadin 2.5mg  PO x 1 tonight Monitor daily INR, CBC, s/s of bleed  Elenor Quinones, PharmD, [facility] Clinical Pharmacist Pager [phone] 11/30/2016 8:32 AM

## 2016-11-30 NOTE — Care Management Note (Addendum)
Case Management Note  Patient Details  Name: Elizabeth [name] MRN: [id number] Date of Birth: [birth year]  Subjective/Objective:     From home with spouse, presents with Flu, CHF with pl effusion, hypoxia, ckd, chronic leukopenia, FTT.  conts on HFNC 4-5 liter, conts to be confused.  Yesterday pating had episode of sudden desat to 37's on 3 liters, was put on NRB,  Per CSW , spouse is interested in taking patient home with [facility].    12/02/16 [place], BSN - NCM spoke with spouse over the phone, he states they like working with [facility], will need HHRN, PT, OT, aide and Social Work. Referral made to [facility] with [facility],  Soc will begin 24-48 hrs post dc.  Will need orders.  Patient has a w/chair at home, grab bars and a hospital bed.  Spouse will be here at 3:30 today.               Action/Plan:   Expected Discharge Date:                  Expected Discharge Plan:  Skilled Nursing Facility  In-House Referral:  Clinical Social Work  Discharge planning Services  CM Consult  Post Acute Care Choice:    Choice offered to:     DME Arranged:    DME Agency:     HH Arranged:    [place] Agency:     Status of Service:  In process, will continue to follow  If discussed at Long Length of Stay Meetings, dates discussed:    Additional Comments:  Zenon Mayo, RN 11/30/2016, 9:53 PM

## 2016-11-30 NOTE — Progress Notes (Signed)
Subjective: Patient is more confused this morning. She was yelling out asking of her husband knew where she was. Stated that she was is in a house. Patient was redirected and informed that she was in fact in the hospital. After redirection, she was oriented to person place and time. She was able to tell me her full name, the we were in [facility], and that it is the year 2018. However 5 minutes later, she was again asking if her husband new she was in this "house".  Objective:  Vital signs in last 24 hours: Vitals:   11/29/16 2126 11/29/16 2128 11/29/16 2308 11/30/16 0349  BP:   (!) 144/80 (!) 128/33  Pulse: (!) 51  (!) 56 (!) 51  Resp: (!) 26  20 14   Temp:   97.8 F (36.6 C) 97.8 F (36.6 C)  TempSrc:   Oral Oral  SpO2: 100% 94% 100% 96%  Weight:    117 lb 15.1 oz (53.5 kg)  Height:       Physical Exam: Constitutional: Frail, ill appearing lady in NAD HEENT: Atraumatic, normocephalic. PERRL, anicteric sclera. Left temple area with healing biopsy site  Neck: Supple, trachea midline.  Cardiovascular: Irregular rhythm, regular rate Pulmonary/Chest: Decreased breaths sounds at bilateral bases (R worse than L), crackles bilaterally. Very poor inspiratory effort. Dyspneic on [place]. Extremities: Left BKA, Right lower extremity with pulses intact, warm and well perfused, no edema  Neurological:A&Ox3, CN II - XII grossly intact.    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   Acute on Chronic Hypoxic Respiratory Failure: Secondary to dCHF exacerbation and pulmonary edema. Patient has chronic pleural effusions for  which she follows with outpatient pulmonology and receives frequent thoracentesis. Now s/p right sided thoracentesis (-1L) on 4/6 this hospitalization. Fluid analysis again consistent with transudate.  She received 5 days of diuretic (IV lasix x 1, then home torsemide 20 mg daily) and diuresed well. Unfortunately, patient is now intermittently delirious and experiencing failure to thrive. She now has very poor po intake and is having recurrent hypoglycemic episodes (See DM below).  Her weight is down to 117 (normal weight ~127 without prosthesis). Diuretics were stopped 4/10 due to concern for dehydration given her poor po intake. Renal function has remained stable. Repeat CXR yesterday showed stable pleural effusions, overall unchanged. Short term goal is to stabilize patient and discharge to SNF with oxygen. Long term goals of care discussion is needed with husband. Patient is very deconditioned and not doing well clinically. Attempted to reach husband by phone yesterday, left voicemail. Will call again today.  -- Hold home torsemide 20mg  daily  -- I/O, daily weights  -- f/u pleural fluid cultures >>no growth final  -- BiPAP prn to maintain oxygen > 92% -- Will update husband   Type II DM:Takes Lantus 8 units and SSI with meals at home. Started on 6 units QHS and S-SSI on admission but CBGs were uncontrolled. Regimen increased to home dose Lantus 8 QHS, however she is now experiencign poor po intake failure to thrive. Lantus held 4/9.  -- Hold Lantus -- Encourage PO intake  -- S-SSI TID w/  meals + HS scale   Hx Renal Transplant:Creatinine elevated to 1.9 on admission. Unclear baseline, last creatinine in our system was 1.2 two years ago. S/p 1 L NS in the ED and 75 cc NS x 48 hours on admission. She subsequently developedplum edema and worsening respiratory status. S/p IV lasix 40 mg x 1, home torsemide was resumed x 4 days. Held due to poor po intake. Renal function has remained stable. --  Continue home prograf, mycophenolate,and prednisone 5 mg daily  -- Follow AM BMP   Influenza B Pneumonia: Resolved, s/p 5 days of tamiflu and cefepime.  Asymptomatic Bacteriuria:Patient denies symptoms of dysuria but UA was checked on admission due to her fever and chronic urinary incontinence. UA was completely negative but UCx grew 80,000 colonies of enterococcus faecium. Patient has been afebrile and is clinically improving. Will monitor for now.   Hx Paroxsymal Atrial Fibrillation: Not currently on any rate or rhythm control medications. Bradycardic and regular this admission. -- Continue home warfarin  -- Tele monitor   Hx Iron Deficiency Anemia: -- Continue home iron supplements  Hx Hypothyroidism: S/p thyroidectomy  -- Continue home Synthroid 112 mcg   Hx Home Orthostasis/hypotension: -- Hold home midodrine  -- Monitor vitals   FEN: No fluid, replete lytes prn, heart healthy carb mod VTE ppx: Warfarin  Code Status: DNR    Dispo: Anticipated discharge in approximately 1-2 day(s).   Velna Ochs, MD 11/30/2016, 7:20 AM Pager: [phone]

## 2016-11-30 NOTE — Progress Notes (Signed)
Inpatient Diabetes Program Recommendations  AACE/ADA: New Consensus Statement on Inpatient Glycemic Control (2015)  Target Ranges:  Prepandial:   less than 140 mg/dL      Peak postprandial:   less than 180 mg/dL (1-2 hours)      Critically ill patients:  140 - 180 mg/dL   Results for Elizabeth [name], Elizabeth [name] (MRN [id number]) as of 11/30/2016 10:52  Ref. Range 11/29/2016 13:06 11/29/2016 15:59 11/29/2016 21:10 11/29/2016 21:33 11/30/2016 08:04  Glucose-Capillary Latest Ref Range: 65 - 99 mg/dL 188 (H) 164 (H) 61 (L) 92 241 (H)   Review of Glycemic Control  Diabetes history: DM  Outpatient Diabetes medications: Lantus 8 units Daily, Humalog 0-9 units 4x/daily Current orders for Inpatient glycemic control: Novolog Sensitive Correction tid + Novolog HS scale  Inpatient Diabetes Program Recommendations:   Glucose hypoglycemia 61 yesterday, Novolog given 2 hours after CBG checked. Will watch trends today on current insulin regimen.   Thanks,  Tama Headings RN, MSN, [facility] Inpatient Diabetes Coordinator Team Pager [phone] (8a-5p)

## 2016-11-30 NOTE — Clinical Social Work Note (Addendum)
CSW met with patient. No supports at bedside. CSW introduced role and explained that PT recommendations would be discussed. Patient preferred that CSW call her husband to discuss. Patient's husband prefers for patient to return home rather than SNF. She is usually in a wheelchair at home due to eyesight issues and weakness. He states that she was at a facility most of 2014 where she would get rehab maybe twice per day and would lay in the bed the rest of the time. The patient got recurrent UTI's during this time. The patient's husband is agreeable to assessing her needs when closer to discharge to determine if she would need PT or not. He is somewhat agreeable to a nurse coming into the home a few times per week. The patient's husband helps her with all ADL's and is home with her throughout the day. The patient is agreeable to this plan. She began yelling out, wanting to see her husband while CSW was in the hallway. CSW went into the room and was able to calm her down. Patient stated that her husband would be here at 3:00 today. CSW told patient it was only 12:00. Patient calm and agreeable to plan that CSW discussed with her husband. RNCM notified.  CSW signing off. Consult again if any other social work needs arise.  Dayton Scrape, [place]

## 2016-11-30 NOTE — Plan of Care (Signed)
Problem: Pain Managment: Goal: General experience of comfort will improve Outcome: Progressing Patient denies pain at this time will continue to monitor

## 2016-11-30 NOTE — Progress Notes (Addendum)
Pt on 4L via [place], sats in the low 80's. O2 moved up to 6L sats in the high 80's -90%. Pt alert with unlabored breathing,  pt does not seem to be in respiratory distress, Bilateral lungs sounds congested and wet majority on the right side. Pt holding full conversation asking to make phone calls to husband and daughter to say goodnight. Respiratory therapist and Rapid response RN called to room. Sat probes switched out pt sats 87%. RRT switched pt to 8L HFNC. Sats 96-100%. Pt now resting comfortably. Per RRT recommendations RN should try to wean pt down on O2 during the night. Will continue to monitor and pass information to oncoming shitft RN.

## 2016-12-01 DIAGNOSIS — Z89512 Acquired absence of left leg below knee: Secondary | ICD-10-CM

## 2016-12-01 DIAGNOSIS — D72818 Other decreased white blood cell count: Secondary | ICD-10-CM

## 2016-12-01 DIAGNOSIS — B009 Herpesviral infection, unspecified: Secondary | ICD-10-CM

## 2016-12-01 DIAGNOSIS — R6251 Failure to thrive (child): Secondary | ICD-10-CM

## 2016-12-01 DIAGNOSIS — B001 Herpesviral vesicular dermatitis: Secondary | ICD-10-CM

## 2016-12-01 DIAGNOSIS — Z940 Kidney transplant status: Secondary | ICD-10-CM

## 2016-12-01 LAB — PROTIME-INR
INR: 3.37
PROTHROMBIN TIME: 34.8 s — AB (ref 11.4–15.2)

## 2016-12-01 LAB — CBC
HCT: 35 % — ABNORMAL LOW (ref 36.0–46.0)
HCT: 34.2 % — ABNORMAL LOW (ref 36.0–46.0)
HEMOGLOBIN: 11.3 g/dL — AB (ref 12.0–15.0)
HEMOGLOBIN: 10.6 g/dL — AB (ref 12.0–15.0)
MCH: 30.5 pg (ref 26.0–34.0)
MCH: 29.4 pg (ref 26.0–34.0)
MCHC: 32.3 g/dL (ref 30.0–36.0)
MCHC: 31 g/dL (ref 30.0–36.0)
MCV: 94.3 fL (ref 78.0–100.0)
MCV: 95 fL (ref 78.0–100.0)
Platelets: 139 10*3/uL — ABNORMAL LOW (ref 150–400)
Platelets: 133 10*3/uL — ABNORMAL LOW (ref 150–400)
RBC: 3.71 MIL/uL — AB (ref 3.87–5.11)
RBC: 3.6 MIL/uL — AB (ref 3.87–5.11)
RDW: 15.2 % (ref 11.5–15.5)
RDW: 15.3 % (ref 11.5–15.5)
WBC: 3.4 10*3/uL — AB (ref 4.0–10.5)
WBC: 3.1 10*3/uL — ABNORMAL LOW (ref 4.0–10.5)

## 2016-12-01 LAB — GLUCOSE, CAPILLARY
GLUCOSE-CAPILLARY: 161 mg/dL — AB (ref 65–99)
GLUCOSE-CAPILLARY: 146 mg/dL — AB (ref 65–99)
GLUCOSE-CAPILLARY: 198 mg/dL — AB (ref 65–99)
Glucose-Capillary: 265 mg/dL — ABNORMAL HIGH (ref 65–99)

## 2016-12-01 LAB — BASIC METABOLIC PANEL
Anion gap: 8 (ref 5–15)
BUN: 42 mg/dL — AB (ref 6–20)
CHLORIDE: 99 mmol/L — AB (ref 101–111)
CO2: 32 mmol/L (ref 22–32)
Calcium: 8.8 mg/dL — ABNORMAL LOW (ref 8.9–10.3)
Creatinine, Ser: 1.45 mg/dL — ABNORMAL HIGH (ref 0.44–1.00)
GFR, EST AFRICAN AMERICAN: 39 mL/min — AB (ref >60)
GFR, EST NON AFRICAN AMERICAN: 34 mL/min — AB (ref >60)
Glucose, Bld: 65 mg/dL (ref 65–99)
POTASSIUM: 4.1 mmol/L (ref 3.5–5.1)
SODIUM: 139 mmol/L (ref 135–145)

## 2016-12-01 NOTE — Progress Notes (Signed)
Medicine attending: I personally examined this patient today and I concur with the evaluation and management plan as recorded by resident physician Dr. Candace Cruise which we discussed together. We greatly appreciate pulmonary critical care consultation.  Recommendations to follow. Patient condition continues to fluctuate but has again stabilized over the last 24 hours with stable oxygenation.  We have been able to wean her oxygen down to 2 L.  Left lung overall clear.  No change in the level of her right pleural effusion by percussion. She is developed a number of herpetic lesions on her upper and lower lips.  In view of her immunocompromised status, we will treat her with Valtrex dose adjusted for her renal failure. Impression: #1. Influenza B, now treated, in an elderly, immunocompromised patient on chronic steroids and MMF. #2. Chronic diastolic heart failure with persistent right transudative pleural effusion. #3. Ongoing multifactorial hypoxia. BNP only moderately elevated. No major improvement in oxygenation after removal of a liter of right pleural fluid. No persistent or progressive changes on chest radiograph.  Pulmonary consultation in progress.  Per my discussion with the pulmonologist outside of the patient's room, he will likely not recommend placing a chronic right pleural drainage catheter in view of the transudate of nature of her effusion.  She has a pulmonologist and a cardiologist in [place] and husband has been in touch with both of them during this admission to keep them updated. #4. CK D4 status post renal transplant on chronic immunosuppressive drugs.  Renal function currently stable but had a new plateau which has deteriorated since last year. #5. Chronic leukopenia related to #4.  White count a little bit better today #6. Failure to thrive secondary to problems listed above and deconditioning. #7.  chronic atrial fibrillation on chronic warfarin anticoagulation.  INR is  supratherapeutic today.  Pharmacy recommends holding 1 dose tonight. #8.  Herpes simplex of the lips.  Treated with Valtrex. Her daughter is present.  Status and management discussed.

## 2016-12-01 NOTE — Progress Notes (Signed)
Subjective: The patient had an episode of emesis this morning after severe coughing attack. She is complaining of nausea and some mild abdominal pain. No episodes of further hypoxia overnight. She is persistently requiring 2 L nasal cannula.  Objective:  Vital signs in last 24 hours: Vitals:   11/30/16 1920 11/30/16 2340 12/01/16 0315 12/01/16 0805  BP: (!) 113/40 (!) 143/39 (!) 113/40 (!) 152/48  Pulse: (!) 54 (!) 56 (!) 52 61  Resp: 20 17 (!) 22 18  Temp: 97.8 F (36.6 C) 98 F (36.7 C) 98.3 F (36.8 C) 97.9 F (36.6 C)  TempSrc: Oral Oral Oral Oral  SpO2: 94% 99% 98% 99%  Weight:   120 lb 9.5 oz (54.7 kg)   Height:       Physical Exam: Constitutional: Frail, ill appearing lady in NAD HEENT: Atraumatic, normocephalic. PERRL, anicteric sclera. Left temple area with healing biopsy site  Neck: Supple, trachea midline.  Cardiovascular: Irregular rhythm, regular rate Pulmonary/Chest: Decreased breaths sounds at bilateral bases (R worse than L), crackles bilaterally. Very poor inspiratory effort. Dyspneic on [place]. Extremities: Left BKA, Right lower extremity with pulses intact, warm and well perfused, no edema  Neurological:A&Ox3, CN II - XII grossly intact.   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   Acute on Chronic Hypoxic Respiratory Failure: Multifactorial due to recent influenza infection (s/p treatment), dCHF, pulmonary edema, chronic recurrent right pleural effusion, and severe deconditioning. She is s/p 5 days of cefepime and tamiflu for influenza B pneumonia in an immunocompromised host. Despite treatment,  patient has persistent hypoxia requiring high flow nasal cannula. She has a chronic right sided pleural effusion for which she follows with outpatient pulmonology and has received 3 thoracentesis in March. Now s/p repeat thoracentesis (-1L) on 4/5 this hospitalization. Fluid analysis again consistent with transudate.  She received 5 days of diuretic (IV lasix x 1, then home torsemide 20 mg daily) and diuresed well. Unfortunately, patient is now intermittently delirious and experiencing failure to thrive. She has very poor po intake and is having recurrent hypoglycemic episodes (See DM below). Diuretics were stopped 4/10 due to concern for dehydration given her poor po intake. Renal function has remained stable. Repeat CXR 4/10 showed stable pleural effusions, overall unchanged. Per husband, her outpatient cardiologist and pulmonologist have been discussing pleurodesis vs. pleurex catheter for long term management of her effusion. Husband reports that her cardiologist favors pleurex catheter and her pulmonologist favors pleurodesis. We discussed goals of care yesterday. Patient is DNR/DNI but otherwise wants to continue with aggressive management. Given that she is unstable for discharge, pulmonology has been consulted to weigh in on the best management of her pleural effusion and new persistent hypoxia. -- Pulmonology consult, appreciate recommendations  -- Hold home torsemide 20mg  daily  -- I/O, daily weights  -- f/u pleural fluid cultures >>no growth final  -- BiPAP prn to maintain oxygen > 92%  Type II DM:Takes Lantus 8 units and SSI with meals at home. Started on 6 units QHS and S-SSI on admission but CBGs were uncontrolled. Regimen increased to home dose Lantus 8 QHS, however she is now experiencign poor po intake  failure to thrive. Lantus held 4/9.  -- Hold Lantus -- Encourage PO intake  -- S-SSI TID w/ meals + HS scale   Hx Renal Transplant:Creatinine elevated to 1.9 on admission. Unclear  baseline, last creatinine in our system was 1.2 two years ago. S/p 1 L NS in the ED and 75 cc NS x 48 hours on admission. She subsequently developedplum edema and worsening respiratory status. S/p IV lasix 40 mg x 1, home torsemide was resumed x 4 days. Held due to poor po intake. Renal function has remained stable. -- Continue home prograf, mycophenolate,and prednisone 5 mg daily  -- Follow AM BMP   Influenza B Pneumonia: Resolved, s/p 5 days of tamiflu and cefepime.  Asymptomatic Bacteriuria:Patient denies symptoms of dysuria but UA was checked on admission due to her fever and chronic urinary incontinence. UA was completely negative but UCx grew 80,000 colonies of enterococcus faecium. Patient has been afebrile and is clinically improving. Will monitor for now.   Hx Paroxsymal Atrial Fibrillation: Not currently on any rate or rhythm control medications. Bradycardic and regular this admission. -- Continue home warfarin  -- Tele monitor   Hx Iron Deficiency Anemia: -- Continue home iron supplements  Hx Hypothyroidism: S/p thyroidectomy  -- Continue home Synthroid 112 mcg   Hx Home Orthostasis/hypotension: -- Hold home midodrine  -- Monitor vitals   FEN: No fluid, replete lytes prn, heart healthy carb mod VTE ppx: Warfarin  Code Status: DNR   Dispo: Anticipated discharge pending pulmonology consultation and recommendations.   Velna Ochs, MD 12/01/2016, 12:31 PM Pager: [phone]

## 2016-12-01 NOTE — Progress Notes (Signed)
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 Pt's BP decreased from the 150's (SBP) to the low 120's (SBP). Pt's HR maintained in the mid 50's throughout. Pt would continue to benefit from skilled physical therapy services at this time while admitted and after d/c to address the below listed limitations in order to improve overall safety and independence with functional mobility.   Follow Up Recommendations  SNF;Supervision/Assistance - 24 hour     Equipment Recommendations  None recommended by PT    Recommendations for Other Services       Precautions / Restrictions Precautions Precautions: Fall Precaution Comments: bilateral LEs braces/prosthesis devices that require assist for donning Restrictions Weight Bearing Restrictions: No    Mobility  Bed Mobility Overal bed mobility: Needs Assistance Bed Mobility: Supine to Sit;Sit to Supine     Supine to sit: Min assist Sit to supine: Min assist   General bed mobility comments: min A to elevate trunk to achieve sitting EOB. min guard to return to supine  Transfers                    Ambulation/Gait                 Stairs            Wheelchair Mobility    Modified Rankin (Stroke Patients Only)       Balance Overall balance assessment: Needs assistance Sitting-balance support: Bilateral upper  extremity supported Sitting balance-Leahy Scale: Poor Sitting balance - Comments: pt able to sit EOB but required bilateral UE supports, unable to have R LE support. pt reported constant dizziness that was not alleviated with time or visual focus strategy. Pt's BP in sitting was 124/75 (in supine 150's/60's). pt tolerated sitting upright for 10 minutes with min guard.                                    Cognition Arousal/Alertness: Awake/alert Behavior During Therapy: Flat affect Overall Cognitive Status: Impaired/Different from baseline Area of Impairment: Memory;Safety/judgement                     Memory: Decreased short-term memory   Safety/Judgement: Decreased awareness of safety;Decreased awareness of deficits            Exercises      General Comments        Pertinent Vitals/Pain Pain Assessment: No/denies pain    Home Living                      Prior Function            PT Goals (current goals can now be found in the care plan section) Acute Rehab PT Goals PT Goal Formulation: With patient Time For Goal Achievement: 12/11/16 Potential to Achieve  Goals: Good Progress towards PT goals: Progressing toward goals    Frequency    Min 3X/week      PT Plan Current plan remains appropriate    Co-evaluation             End of Session Equipment Utilized During Treatment: Oxygen Activity Tolerance: Patient limited by fatigue;Other (comment) (pt limited secondary to dizziness) Patient left: in bed;with call bell/phone within reach;with family/visitor present Nurse Communication: Mobility status PT Visit Diagnosis: Muscle weakness (generalized) (M62.81)     Time: 1640-1700 PT Time Calculation (min) (ACUTE ONLY): 20 min  Charges:  $Therapeutic Activity: 8-22 mins                    G Codes:       [place], [place], [place] 12/01/2016, 5:15 PM

## 2016-12-01 NOTE — Progress Notes (Signed)
ANTICOAGULATION CONSULT NOTE - Follow-Up  Pharmacy Consult for warfarin Indication: atrial fibrillation  Patient Measurements: Height: 5\' 3"  (160 cm) Weight: 120 lb 9.5 oz (54.7 kg) IBW/kg (Calculated) : 52.4  Vital Signs: Temp: 98.3 F (36.8 C) (04/12 0315) Temp Source: Oral (04/12 0315) BP: 113/40 (04/12 0315) Pulse Rate: 52 (04/12 0315)  Assessment: 75 YOF on Coumadin 2.5mg  daily exc for 5mg  on Sun/Tues/Thurs PTA for hx Afib. INR therapeutic on admission. INR this morning remains jumped up to 3.37. Did receive 1 dose of fluconazole on 4/11. Hgb low but stable at 11.3, plts low at 139.  Goal of Therapy:  INR 2-3 Monitor platelets by anticoagulation protocol: Yes   Plan:  Hold Coumadin tonight Monitor daily INR, CBC, s/s of bleed  Elenor Quinones, PharmD, [facility] Clinical Pharmacist Pager [phone] 12/01/2016 8:24 AM

## 2016-12-01 NOTE — Progress Notes (Signed)
Inpatient Diabetes Program Recommendations  AACE/ADA: New Consensus Statement on Inpatient Glycemic Control (2015)  Target Ranges:  Prepandial:   less than 140 mg/dL      Peak postprandial:   less than 180 mg/dL (1-2 hours)      Critically ill patients:  140 - 180 mg/dL   Lab Results  Component Value Date   GLUCAP 146 (H) 12/01/2016   HGBA1C 6.2 (H) 11/19/2013    Review of Glycemic Control  Results for LAYZA, [facility] (MRN [id number]) as of 12/01/2016 14:54  Ref. Range 11/30/2016 13:18 11/30/2016 16:10 11/30/2016 22:18 12/01/2016 08:34 12/01/2016 13:11  Glucose-Capillary Latest Ref Range: 65 - 99 mg/dL 240 (H) 163 (H) 103 (H) 161 (H) 146 (H)   Diabetes history: DM   Outpatient Diabetes medications: Lantus 8 units Daily, Humalog 0-9 units 4x/daily  Current orders for Inpatient glycemic control: Novolog Sensitive Correction (0-9units) tid + Novolog HS scale (0-5units)  Inpatient Diabetes Program Recommendations: Agree with current medications for blood sugar management.    Gentry Fitz, RN, BA, MHA, CDE Diabetes Coordinator Inpatient Diabetes Program  [phone] (Team Pager) [phone] ([place]) 12/01/2016 2:56 PM

## 2016-12-02 DIAGNOSIS — N183 Chronic kidney disease, stage 3 (moderate): Secondary | ICD-10-CM

## 2016-12-02 DIAGNOSIS — J9621 Acute and chronic respiratory failure with hypoxia: Secondary | ICD-10-CM

## 2016-12-02 DIAGNOSIS — Z885 Allergy status to narcotic agent status: Secondary | ICD-10-CM

## 2016-12-02 DIAGNOSIS — Z940 Kidney transplant status: Secondary | ICD-10-CM

## 2016-12-02 DIAGNOSIS — I5032 Chronic diastolic (congestive) heart failure: Secondary | ICD-10-CM

## 2016-12-02 DIAGNOSIS — J101 Influenza due to other identified influenza virus with other respiratory manifestations: Secondary | ICD-10-CM

## 2016-12-02 DIAGNOSIS — E1122 Type 2 diabetes mellitus with diabetic chronic kidney disease: Secondary | ICD-10-CM

## 2016-12-02 DIAGNOSIS — Z794 Long term (current) use of insulin: Secondary | ICD-10-CM

## 2016-12-02 DIAGNOSIS — J918 Pleural effusion in other conditions classified elsewhere: Secondary | ICD-10-CM

## 2016-12-02 DIAGNOSIS — Z881 Allergy status to other antibiotic agents status: Secondary | ICD-10-CM

## 2016-12-02 DIAGNOSIS — Z7952 Long term (current) use of systemic steroids: Secondary | ICD-10-CM

## 2016-12-02 DIAGNOSIS — E11649 Type 2 diabetes mellitus with hypoglycemia without coma: Secondary | ICD-10-CM

## 2016-12-02 LAB — PROTIME-INR
INR: 3.49
PROTHROMBIN TIME: 35.9 s — AB (ref 11.4–15.2)

## 2016-12-02 LAB — SUSCEPTIBILITY, AER + ANAEROB

## 2016-12-02 LAB — BASIC METABOLIC PANEL
Anion gap: 9 (ref 5–15)
BUN: 45 mg/dL — AB (ref 6–20)
CALCIUM: 8.8 mg/dL — AB (ref 8.9–10.3)
CO2: 30 mmol/L (ref 22–32)
CREATININE: 1.6 mg/dL — AB (ref 0.44–1.00)
Chloride: 99 mmol/L — ABNORMAL LOW (ref 101–111)
GFR, EST AFRICAN AMERICAN: 35 mL/min — AB (ref >60)
GFR, EST NON AFRICAN AMERICAN: 30 mL/min — AB (ref >60)
Glucose, Bld: 78 mg/dL (ref 65–99)
Potassium: 4.1 mmol/L (ref 3.5–5.1)
Sodium: 138 mmol/L (ref 135–145)

## 2016-12-02 LAB — GLUCOSE, CAPILLARY
GLUCOSE-CAPILLARY: 126 mg/dL — AB (ref 65–99)
GLUCOSE-CAPILLARY: 200 mg/dL — AB (ref 65–99)
GLUCOSE-CAPILLARY: 241 mg/dL — AB (ref 65–99)

## 2016-12-02 LAB — SUSCEPTIBILITY RESULT

## 2016-12-02 MED ORDER — MIDODRINE HCL 5 MG PO TABS
10.0000 mg | ORAL_TABLET | Freq: Once | ORAL | Status: AC
  Administered 2016-12-02: 10 mg via ORAL
  Filled 2016-12-02: qty 2

## 2016-12-02 NOTE — Discharge Summary (Signed)
Name: Elizabeth [name] MRN: [id number] DOB: [birth year] 76 y.o. PCP: Donato Heinz, MD  Date of Admission: 11/22/2016  8:59 AM Date of Discharge: 12/02/2016 Attending Physician: Annia Belt, MD  Discharge Diagnosis: 1. Acute Hypoxic Respiratory Failure 2. Influenza B Infection  3. Right Pleural Effusion  4. Hx Renal Transplant   Discharge Medications: Allergies as of 12/02/2016      Reactions   Azithromycin Other (See Comments)   Upset stomach   Erythromycin Nausea And Vomiting   Fentanyl Nausea And Vomiting   Percocet [oxycodone-acetaminophen] Nausea And Vomiting      Medication List    TAKE these medications   acetaminophen 500 MG tablet Commonly known as:  TYLENOL Take 1,000 mg by mouth every 6 (six) hours as needed for moderate pain or headache.   atorvastatin 10 MG tablet Commonly known as:  LIPITOR Take 1 tablet (10 mg total) by mouth every other day.   cholecalciferol 1000 units tablet Commonly known as:  VITAMIN D Take 2,000 Units by mouth daily.   docusate sodium 100 MG capsule Commonly known as:  COLACE Take 100 mg by mouth daily as needed for moderate constipation.   HYDROcodone-acetaminophen 5-325 MG tablet Commonly known as:  NORCO/VICODIN Take 2 tablets by mouth 2 (two) times daily as needed for moderate pain.   hyoscyamine 0.125 MG Tbdp disintergrating tablet Commonly known as:  ANASPAZ Place 0.125 mg under the tongue as needed for cramping.   insulin glargine 100 UNIT/ML injection Commonly known as:  LANTUS Inject 8 Units into the skin every morning.   insulin lispro 100 UNIT/ML injection Commonly known as:  HUMALOG Inject 2-9 Units into the skin 4 (four) times daily -  before meals and at bedtime. Per sliding scale.   levothyroxine 112 MCG tablet Commonly known as:  SYNTHROID, LEVOTHROID Take 112 mcg by mouth daily before breakfast.   LEXAPRO 10 MG tablet Generic drug:  escitalopram Take 10 mg by mouth daily.   magnesium  gluconate 500 MG tablet Commonly known as:  MAGONATE Take 500 mg by mouth 2 (two) times daily.   midodrine 10 MG tablet Commonly known as:  PROAMATINE Take 1 tablet (10 mg total) by mouth 3 (three) times daily as needed.   mirtazapine 7.5 MG tablet Commonly known as:  REMERON Take 7.5 mg by mouth at bedtime.   multivitamin with minerals Tabs tablet Take 1 tablet by mouth daily.   mycophenolate 360 MG Tbec EC tablet Commonly known as:  MYFORTIC Take 360 mg by mouth 2 (two) times daily.   nitroGLYCERIN 0.4 MG SL tablet Commonly known as:  NITROSTAT Place 1 tablet (0.4 mg total) under the tongue every 5 (five) minutes as needed for chest pain.   omeprazole 20 MG capsule Commonly known as:  PRILOSEC Take 20 mg by mouth at bedtime.   ondansetron 4 MG tablet Commonly known as:  ZOFRAN Take 4 mg by mouth every 8 (eight) hours as needed for nausea or vomiting.   potassium chloride 10 MEQ tablet Commonly known as:  K-DUR Take 10 mEq by mouth daily at 12 noon.   predniSONE 5 MG tablet Commonly known as:  DELTASONE Take 5 mg by mouth every morning.   promethazine 25 MG tablet Commonly known as:  PHENERGAN Take 1 tablet (25 mg total) by mouth every 6 (six) hours as needed for nausea or vomiting.   SM IRON 325 (65 FE) MG tablet Generic drug:  ferrous sulfate Take 325 mg by mouth daily.  tacrolimus 0.5 MG capsule Commonly known as:  PROGRAF Take 1-2 mg by mouth See admin instructions. Take 2mg  in the am & 1mg  at night   torsemide 20 MG tablet Commonly known as:  DEMADEX TAKE ONE TABLET BY MOUTH TWICE DAILY AS NEEDED What changed:  See the new instructions.   warfarin 2.5 MG tablet Commonly known as:  COUMADIN TAKE AS DIRECTED BY COUMADIN CLINIC What changed:  See the new instructions.       Disposition and follow-up:   Ms.Elizabeth [name] was discharged from [facility] in Stable condition.  At the hospital follow up visit please address:  1.   Acute Hypoxic Respiratory Failure: Multifactorial secondary to recent influenza B infection (s/p treatment with renally dosed tamiflu, cefepime, and stress dose steroids), pulmonary edema, recurrent right sided pleural effusion, and severe deconditioning. S/p thoracentesis on 11/24/2016 with ~1L fluid removed. Fluid analysis was consistent with transudate and cultures remained negative. She was diuresed x 5 days and renal function remained stable. Patient was discharged on temporary home oxygen (2L [place]) and her home PRN torsemide 20 mg was resumed. She was instructed to follow up with her PCP and pulmonologist for ongoing management of her chronic pleural effusion. Please reassess oxygen requirement and adjust as necessary.   2. Hx of Renal Transplant: In 2001. home prograf, mycophenolate, and prednisone were continued. Renal function remained stable. Creatinine 1.9 on admission, 1.6 on discharge.   3.  Labs / imaging needed at time of follow-up: None   4.  Pending labs/ test needing follow-up: None   Follow-up Appointments:   Hospital Course by problem list:  1. Acute Hypoxic Respiratory Failure: Multifactorial due to influenza B infection, dCHF, pulmonary edema, chronic recurrent right pleural effusion, and severe deconditioning. Patient presented on 11/22/2016 with productive cough, congestion, and fevers up to 103F. She has a history of renal transplant (2001) and is immunosuppressed on prograf, mycophenolate, and chronic prednisone 5 mg daily. Flu panel resulted positive for influenza B on admission. Of note, patient has a history of a chronic recurrent right sided pleural effusion for which she follows with outpatient pulmonology (Dr. Raul Del in [place]), s/p thoracentesis x 3 in March 2018 consistent with transudative effusion each time. Felt to be secondary to her dCHF. On admission, CXR showed near complete resolution of a prior left pleural effusion and a small right pleural effusion with  atelectasis, but no evidence of consolidation. She received 1L NS on the ED. She was started on renally dosed tamiflu, IV levaquin, and NS @ 75 cc/hr on admission. Unfortnately, patient failed to improve clinically and on day 2 of hospitalization developed worsening hypoxia, persistent tachypnea, and increased work of breathing. Repeat CXR showed bilateral pulmonary infiltrates and bilateral pleural effusions consistent with CHF that had progressed since prior imaging. Fluids were discontinued and antibiotics wer escalated to cefepime. She was placed on stress dose steroids x 3 days. She underwent an US guided right sided thoracentesis on 11/24/2016 with ~1L fluid removed. Analysis was again consistent with a transudate, and cultures remained negative. She was given one dose of IV lasix and her home PRN torsemide was resumed x 5 days. Patient diuresed well. Unfortunately I/Os were inaccurate due to urinary incontinence, but weight came down appropriately. Again, despite this, patient failed to improve clinically. She was persistently hypoxic requiring supplemental oxygen and had multiple episodes of desaturation to 60s-70s requiring high flow nasal cannula. Repeat CXR showed no change in her pleural effusion. She was transferred  to the step down unit in anticipation of the need for BiPAP which she fortunately never required. She also developed intermittent confusion, likely waxing and waning delirium and failure to thrive. She had very poor PO intake with multiple hypoglycemic episodes. Her Lantus was held. Goals of care discussion was held with the patient, husband [name], and daughter on 11/29/16. Patient and family confirmed her DNR/DNI status but otherwise wished to continue with aggressive care. Per husband, her outpatient cardiologist and pulmonologist had been discussing pleurodesis vs. pleurex catheter for long term management of her effusion. Per family request, pulmonology was consulted to weigh in on this  matter who recommended neither, rather medical optimization of her diuretic and volume status. Her oxygen requirement slowly improved. She remained stable on 2L [place] x 48 hours prior to discharge. Given that she had reached a point of clinical stability, she was discharged on home oxygen and with plans to follow up with her outpatient pulmonologist for further management of her effusion.    2. Influenza B Infection: S/p 5 days of renally dose Tamiflu and antibiotics (IV levaquin x 2 days, IV cefepime x 3 days), and stress dose steroid IV solumedrol 40 mg x 3 days.   3. History of Renal Transplant: Creatinine was elevated to 1.9 on admission. Baseline was unclear as the last creatinine in our system was 1.2 two years ago. Per husband, creatinine had been normal until the fall of 2017 when it began to slowly uptrend. Patient received 1L NS bolus and 75 cc/hr NS x 48 hours on admission due to concern for AKI and sepsis. Unfortunately, patient subsequently developed pulmonary edema and worsening of her right sided pleural effusion. Fluids were stopped, and she under went thoracentesis on 11/24/2016 with 1L fluid removed. She received 1 dose of IV lasix and 5 days of her home PRN torsemide. Patient diuresed well and renal function remained stable.  Her home prograf, mycophenolate, and prednisone were continued.   4. Type II DM: Patient takes 8 units of Lantus QHS and SSI with meals at home. She was initially started on 6 units Lantus QHS and sensitive SSI on admission but CBGs were uncontrolled. Regiment was increased to her home Lantus dose 8 units QHS, however she subsequently developed failure to thrive and poor po intake resulting in recurrent hypoglycemic episodes. Lantus was held 4/9. CBGs subsequently stabilized. PO intake was encouraged. She was instructed to resume her home regimen on discharge.    Discharge Vitals:   BP (!) 146/34   Pulse (!) 50   Temp 98.3 F (36.8 C) (Oral)   Resp 20   Ht 5\' 3"   (1.6 m)   Wt 117 lb 11.6 oz (53.4 kg)   SpO2 98%   BMI 20.85 kg/m   Pertinent Labs, Studies, and Procedures:   11/22/2016 Chest 2 View:  IMPRESSION: No significant change in small subpulmonic right pleural effusion and right basilar atelectasis.  Near complete resolution of left pleural effusion since prior study.  11/24/2016 Chest 2 View: IMPRESSION: Prior CABG. Bilateral pulmonary infiltrates and bilateral pleural effusions consistent with CHF. Findings have progressed from prior exam .  11/24/2016 IR Thoracentesis ASP Pleural Space:  FINDINGS: A total of approximately 1.0 liters of clear, yellow fluid was removed. Samples were sent to the laboratory as requested by the clinical team.  IMPRESSION: Successful ultrasound guided diagnostic and therapeutic right thoracentesis yielding 1.0 of pleural fluid. Procedure was stopped prior to removal of all fluid due to patient discomfort and nausea.  11/24/2016 Chest 1 View:  IMPRESSION: Less pleural fluid on the right following thoracentesis. No pneumothorax.  11/26/2016 Chest 2 View:  IMPRESSION: Unchanged bilateral, right greater than left pleural effusions with associated atelectasis/ consolidation.  Aortic atherosclerosis, with changes of extensive atherosclerotic calcifications.  Surgical changes of median sternotomy and CABG.  11/29/2016 Chest 1 View:  IMPRESSION: 1. Stable radiographic appearance the chest with suspected bilateral pleural effusions and passive atelectasis obscuring the lung bases. 2. Cardiomegaly with mitral annular calcification and atherosclerotic calcification.   Discharge Instructions: Discharge Instructions    Call MD for:  difficulty breathing, headache or visual disturbances    Complete by:  As directed    Call MD for:  persistant dizziness or light-headedness    Complete by:  As directed    Call MD for:  temperature >100.4    Complete by:  As directed    Diet - low sodium heart  healthy    Complete by:  As directed    Discharge instructions    Complete by:  As directed    Ms. Elizabeth [name],  It was a pleasure taking care of you. I am glad you are doing so much better! Please continue to take all of your medicines as previously prescribed. I have called Dr. Reuel Boom office for you, they will be contacting you for a hospital follow up appointment. Please follow up with them in the next 1-2 weeks. If you do not hear from them, please give their office a call. In the meantime, please continue to wear your oxygen at home. You have been requiring about 2L consistently here in the hospital. Your doctor will reassess your oxygen need at your follow up appointment. If you have any questions before you go, please let your nurse know to get in touch with me. Thank you!   - Dr. Philipp Ovens   Increase activity slowly    Complete by:  As directed       Signed: Velna Ochs, MD 12/02/2016, 2:31 PM   Pager: [phone]

## 2016-12-02 NOTE — Care Management Important Message (Signed)
Important Message  Patient Details  Name: Elizabeth [name] MRN: [id number] Date of Birth: 27-Dec-1940   Medicare Important Message Given:  Yes    Nathen May 12/02/2016, 2:03 PM

## 2016-12-02 NOTE — Progress Notes (Signed)
Internal Medicine Attending  Date: 12/02/2016  Patient name: Elizabeth [name] Medical record number: [id number] Date of birth: Jan 27, 1941 Age: 76 y.o. Gender: female  I saw and evaluated the patient. I reviewed the resident's note by Dr. Philipp Ovens and I agree with the resident's findings and plans as documented in her progress note.  Ms. Pieczynski has reached a point of stability over the last 24-48 hours and is ready for discharge home.  Several comorbid conditions are contributing to her chronic disability and require further outpatient therapeutic modifications, all of which are best done in the outpatient setting by her physicians that know her best.  I agree with discharge home today.

## 2016-12-02 NOTE — Care Management Note (Signed)
Case Management Note  Patient Details  Name: Elizabeth [name] MRN: [id number] Date of Birth: 1941-01-11  Subjective/Objective:    From home with spouse, presents with Flu, CHF with pl effusion, hypoxia, ckd, chronic leukopenia, FTT.  conts on HFNC 4-5 liter, conts to be confused.  Yesterday pating had episode of sudden desat to 40's on 3 liters, was put on NRB,  Per CSW , spouse is interested in taking patient home with [facility].    12/02/16 [place], BSN - NCM spoke with spouse over the phone, he states they like working with [facility], will need HHRN, PT, OT, aide and Social Work. Referral made to [facility] with [facility],  Soc will begin 24-48 hrs post dc.  Will need orders.  Patient has a w/chair at home, grab bars and a hospital bed.  Spouse will be here at 3:30 today. Patient also needs home oxygen, spouse here at bedside, chose [facility] for home oxygen. Referral givent to [facility] with [facility].                         Action/Plan:   Expected Discharge Date:  12/02/16               Expected Discharge Plan:  Skilled Nursing Facility  In-House Referral:  Clinical Social Work  Discharge planning Services  CM Consult  Post Acute Care Choice:  Durable Medical Equipment, Home Health Choice offered to:  Spouse  DME Arranged:  Oxygen DME Agency:  [place] Arranged:  RN, PT, OT, Nurse's Aide, Social Work [employer] Agency:  [place]  Status of Service:  Completed, signed off  If discussed at [employer], dates discussed:    Additional Comments:  Zenon Mayo, RN 12/02/2016, 5:03 PM

## 2016-12-02 NOTE — Progress Notes (Signed)
SATURATION QUALIFICATIONS: (This note is used to comply with regulatory documentation for home oxygen)  Patient Saturations on Room Air at Rest = 85%  Patient Saturations on Room Air while Ambulating = %  Patient Saturations on   Liters of oxygen while Ambulating = %  Please briefly explain why patient needs home oxygen: Patient BKA and non ambulatory. When resting on room air, patient is dropping to the mid 80s. On 2 liters at rest, patient saturations are 100%.   Basil Dess, RN

## 2016-12-02 NOTE — Care Management Note (Signed)
Case Management Note  Patient Details  Name: Elizabeth [name] MRN: [id number] Date of Birth: [birth year]  Subjective/Objective:    From home with spouse, presents with Flu, CHF with pl effusion, hypoxia, ckd, chronic leukopenia, FTT.  conts on HFNC 4-5 liter, conts to be confused.  Yesterday pating had episode of sudden desat to 26's on 3 liters, was put on NRB,  Per CSW , spouse is interested in taking patient home with [facility].    12/02/16 [place], BSN - NCM spoke with spouse over the phone, he states they like working with [facility], will need HHRN, PT, OT, aide and Social Work. Referral made to [facility] with [facility],  Soc will begin 24-48 hrs post dc.  Will need orders.  Patient has a w/chair at home, grab bars and a hospital bed.  Spouse will be here at 3:30 today.                             Action/Plan:   Expected Discharge Date:                  Expected Discharge Plan:  Skilled Nursing Facility  In-House Referral:  Clinical Social Work  Discharge planning Services  CM Consult  Post Acute Care Choice:    Choice offered to:  Spouse  DME Arranged:    DME Agency:     [facility] Arranged:  RN, PT, OT, Nurse's Aide, Social Work [employer] Agency:  [place]  Status of Service:  Completed, signed off  If discussed at [employer], dates discussed:    Additional Comments:  Zenon Mayo, RN 12/02/2016, 10:43 AM

## 2016-12-02 NOTE — Progress Notes (Signed)
Subjective: Patient is doing well today. She has remained stable on 2-3L [place] over the past 48 hours without any episodes of desaturation. Paged shortly after rounds by RN for symptomatic hypotension. Gave one dose of her home prn midodrine.   Objective:  Vital signs in last 24 hours: Vitals:   12/01/16 2005 12/02/16 0024 12/02/16 0429 12/02/16 0732  BP: (!) 102/36 (!) 125/34 (!) 152/87   Pulse: (!) 59 (!) 52 (!) 59   Resp: (!) 21 15 18    Temp:  97.8 F (36.6 C) 98.3 F (36.8 C) 98.3 F (36.8 C)  TempSrc:  Axillary Oral Oral  SpO2: 90% 96% 96% 96%  Weight:   117 lb 11.6 oz (53.4 kg)   Height:       Physical Exam: Constitutional: Frail, ill appearing lady in NAD HEENT: Atraumatic, normocephalic. PERRL, anicteric sclera. Left temple area with healing biopsy site  Neck: Supple, trachea midline.  Cardiovascular: Irregular rhythm, regular rate Pulmonary/Chest: Decreased breaths sounds at bilateral bases (R worse than L), crackles bilaterally. Breathing comfortably on [place] Extremities: Left BKA, Right lower extremity with pulses intact, warm and well perfused, no edema  Neurological:A&Ox3, CN II - XII grossly intact.    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   Acute on Chronic Hypoxic Respiratory Failure: Multifactorial due to recent influenza infection (s/p treatment), dCHF, pulmonary edema, chronic recurrent right pleural effusion, and severe deconditioning. Stable on 2L [place] over the past 48 hours. Clinically, she has improved significantly and she is eager for discharge. PT recommended SNF but patient and husband prefer  discharge home. Pulmonology was consulted for ongoing discussion on management of her chronic, recurring right sided pleural effusion who recommended optimization of her diuretics and volume status for better control. Patient has also been discussing the option of pleurodesis vs. pleurex catheter with her outpatient pulmonologist and cardiologist. Given that she is clinically stable, I think this decision can be deferred to her outpatient providers. Will plan to discharge with home oxygen today and close outpatient follow up.  -- Pulmonology consult, appreciate recommendations  -- Holding home torsemide 20mg  daily  -- I/O, daily weights  -- f/u pleural fluid cultures >>no growth final  -- BiPAP prn to maintain oxygen > 92%  Type II DM:Takes Lantus 8 units and SSI with meals at home. Started on 6 units QHS and S-SSI on admission but CBGs were uncontrolled. Regimen increased to home dose Lantus 8 QHS, however she is now experiencign poor po intake failure to thrive. Lantus held 4/9. CBGs have stabilized.  -- Hold Lantus -- Encourage PO intake  -- S-SSI TID w/ meals + HS scale   Hx Renal Transplant:Creatinine elevated to 1.9 on admission. Unclear baseline, last creatinine in our system was 1.2 two years ago. S/p 1 L NS in the ED and 75 cc NS x 48 hours on admission. She subsequently developedplum edema and worsening respiratory status. S/p IV lasix 40 mg x 1, home torsemide was resumed x 4 days. Held due to poor po intake. Renal function has remained stable. -- Continue home prograf, mycophenolate,and prednisone 5 mg daily  -- Follow AM BMP  Influenza B Pneumonia: Resolved, s/p 5 days of tamiflu and cefepime.  Asymptomatic Bacteriuria:Patient denies symptoms of dysuria but UA was checked on admission due to her fever and chronic urinary incontinence. UA was completely negative but UCx grew 80,000 colonies of enterococcus faecium. Patient has been afebrile and is clinically improving. Will  monitor for now.   Hx Paroxsymal Atrial Fibrillation: Not currently on any rate or rhythm control medications. Bradycardic and regular this admission. -- Continue home warfarin  -- Tele monitor   Hx Iron Deficiency Anemia: -- Continue home iron supplements  Hx Hypothyroidism: S/p thyroidectomy  -- Continue home Synthroid 112 mcg   Hx Home Orthostasis/hypotension: -- Hold home midodrine  -- Monitor vitals   FEN: No fluid, replete lytes prn, heart healthy carb mod VTE ppx: Warfarin  Code Status: DNR   Dispo: Anticipated discharge today.   Velna Ochs, MD 12/02/2016, 11:35 AM Pager: [phone]

## 2016-12-02 NOTE — Progress Notes (Signed)
Discharge note. Discharge education done with patient and patient's husband. RN educated on discharge instructions, medications and when to take them, follow up appointments, when to call the MD, diet instructions, information on heart failure, and information on coumadin.   Patient discharged by wheelchair with NT.   Basil Dess, RN

## 2016-12-02 NOTE — Progress Notes (Signed)
ANTICOAGULATION CONSULT NOTE - Follow-Up  Pharmacy Consult for warfarin Indication: atrial fibrillation  Patient Measurements: Height: 5\' 3"  (160 cm) Weight: 117 lb 11.6 oz (53.4 kg) IBW/kg (Calculated) : 52.4  Vital Signs: Temp: 98.3 F (36.8 C) (04/13 0732) Temp Source: Oral (04/13 0732) BP: 152/87 (04/13 0429) Pulse Rate: 59 (04/13 0429)  Assessment: 75 YOF on Coumadin 2.5mg  daily exc for 5mg  on Sun/Tues/Thurs PTA for hx Afib. INR therapeutic on admission. INR this morning up a little more to 3.49 but no dose was given last night. Did receive 1 dose of fluconazole on 4/11. Hgb low but stable at 10.6, plts low at 133.  Goal of Therapy:  INR 2-3 Monitor platelets by anticoagulation protocol: Yes   Plan:  Hold Coumadin tonight Monitor daily INR, CBC, s/s of bleed  Elenor Quinones, PharmD, [facility] Clinical Pharmacist Pager [phone] 12/02/2016 8:24 AM

## 2016-12-05 LAB — URINE CULTURE

## 2016-12-07 ENCOUNTER — Emergency Department: Payer: Medicare Other

## 2016-12-07 ENCOUNTER — Inpatient Hospital Stay
Admission: EM | Admit: 2016-12-07 | Discharge: 2016-12-16 | DRG: 682 | Disposition: A | Discharge status: Skilled Nursing Facility | Payer: Medicare Other | Attending: Internal Medicine | Admitting: Internal Medicine

## 2016-12-07 ENCOUNTER — Encounter: Payer: Self-pay | Admitting: *Deleted

## 2016-12-07 DIAGNOSIS — R0602 Shortness of breath: Secondary | ICD-10-CM | POA: Diagnosis not present

## 2016-12-07 DIAGNOSIS — M810 Age-related osteoporosis without current pathological fracture: Secondary | ICD-10-CM | POA: Diagnosis present

## 2016-12-07 DIAGNOSIS — I2510 Atherosclerotic heart disease of native coronary artery without angina pectoris: Secondary | ICD-10-CM | POA: Diagnosis present

## 2016-12-07 DIAGNOSIS — Z951 Presence of aortocoronary bypass graft: Secondary | ICD-10-CM

## 2016-12-07 DIAGNOSIS — R680 Hypothermia, not associated with low environmental temperature: Secondary | ICD-10-CM | POA: Diagnosis present

## 2016-12-07 DIAGNOSIS — E1151 Type 2 diabetes mellitus with diabetic peripheral angiopathy without gangrene: Secondary | ICD-10-CM | POA: Diagnosis present

## 2016-12-07 DIAGNOSIS — J90 Pleural effusion, not elsewhere classified: Secondary | ICD-10-CM

## 2016-12-07 DIAGNOSIS — K449 Diaphragmatic hernia without obstruction or gangrene: Secondary | ICD-10-CM | POA: Diagnosis not present

## 2016-12-07 DIAGNOSIS — Z90710 Acquired absence of both cervix and uterus: Secondary | ICD-10-CM

## 2016-12-07 DIAGNOSIS — K219 Gastro-esophageal reflux disease without esophagitis: Secondary | ICD-10-CM | POA: Diagnosis present

## 2016-12-07 DIAGNOSIS — E160 Drug-induced hypoglycemia without coma: Secondary | ICD-10-CM | POA: Diagnosis present

## 2016-12-07 DIAGNOSIS — Z803 Family history of malignant neoplasm of breast: Secondary | ICD-10-CM | POA: Diagnosis not present

## 2016-12-07 DIAGNOSIS — Z794 Long term (current) use of insulin: Secondary | ICD-10-CM

## 2016-12-07 DIAGNOSIS — I480 Paroxysmal atrial fibrillation: Secondary | ICD-10-CM | POA: Diagnosis not present

## 2016-12-07 DIAGNOSIS — J9621 Acute and chronic respiratory failure with hypoxia: Secondary | ICD-10-CM | POA: Diagnosis present

## 2016-12-07 DIAGNOSIS — I132 Hypertensive heart and chronic kidney disease with heart failure and with stage 5 chronic kidney disease, or end stage renal disease: Secondary | ICD-10-CM | POA: Diagnosis present

## 2016-12-07 DIAGNOSIS — I4891 Unspecified atrial fibrillation: Secondary | ICD-10-CM

## 2016-12-07 DIAGNOSIS — Z79899 Other long term (current) drug therapy: Secondary | ICD-10-CM | POA: Diagnosis not present

## 2016-12-07 DIAGNOSIS — Z9049 Acquired absence of other specified parts of digestive tract: Secondary | ICD-10-CM

## 2016-12-07 DIAGNOSIS — Z9849 Cataract extraction status, unspecified eye: Secondary | ICD-10-CM | POA: Diagnosis not present

## 2016-12-07 DIAGNOSIS — E11319 Type 2 diabetes mellitus with unspecified diabetic retinopathy without macular edema: Secondary | ICD-10-CM | POA: Diagnosis present

## 2016-12-07 DIAGNOSIS — N179 Acute kidney failure, unspecified: Secondary | ICD-10-CM | POA: Diagnosis not present

## 2016-12-07 DIAGNOSIS — E860 Dehydration: Secondary | ICD-10-CM | POA: Diagnosis present

## 2016-12-07 DIAGNOSIS — D490 Neoplasm of unspecified behavior of digestive system: Secondary | ICD-10-CM | POA: Diagnosis present

## 2016-12-07 DIAGNOSIS — Z888 Allergy status to other drugs, medicaments and biological substances status: Secondary | ICD-10-CM

## 2016-12-07 DIAGNOSIS — E875 Hyperkalemia: Secondary | ICD-10-CM | POA: Diagnosis present

## 2016-12-07 DIAGNOSIS — Z86711 Personal history of pulmonary embolism: Secondary | ICD-10-CM

## 2016-12-07 DIAGNOSIS — Z8249 Family history of ischemic heart disease and other diseases of the circulatory system: Secondary | ICD-10-CM

## 2016-12-07 DIAGNOSIS — I5032 Chronic diastolic (congestive) heart failure: Secondary | ICD-10-CM | POA: Diagnosis present

## 2016-12-07 DIAGNOSIS — R112 Nausea with vomiting, unspecified: Secondary | ICD-10-CM | POA: Diagnosis not present

## 2016-12-07 DIAGNOSIS — Z9981 Dependence on supplemental oxygen: Secondary | ICD-10-CM

## 2016-12-07 DIAGNOSIS — R1310 Dysphagia, unspecified: Secondary | ICD-10-CM

## 2016-12-07 DIAGNOSIS — Z823 Family history of stroke: Secondary | ICD-10-CM

## 2016-12-07 DIAGNOSIS — Z86718 Personal history of other venous thrombosis and embolism: Secondary | ICD-10-CM

## 2016-12-07 DIAGNOSIS — R32 Unspecified urinary incontinence: Secondary | ICD-10-CM | POA: Diagnosis present

## 2016-12-07 DIAGNOSIS — T383X5A Adverse effect of insulin and oral hypoglycemic [antidiabetic] drugs, initial encounter: Secondary | ICD-10-CM | POA: Diagnosis present

## 2016-12-07 DIAGNOSIS — F329 Major depressive disorder, single episode, unspecified: Secondary | ICD-10-CM | POA: Diagnosis present

## 2016-12-07 DIAGNOSIS — R791 Abnormal coagulation profile: Secondary | ICD-10-CM | POA: Diagnosis present

## 2016-12-07 DIAGNOSIS — Z85820 Personal history of malignant melanoma of skin: Secondary | ICD-10-CM

## 2016-12-07 DIAGNOSIS — A084 Viral intestinal infection, unspecified: Secondary | ICD-10-CM | POA: Diagnosis present

## 2016-12-07 DIAGNOSIS — R001 Bradycardia, unspecified: Secondary | ICD-10-CM | POA: Diagnosis present

## 2016-12-07 DIAGNOSIS — Z9889 Other specified postprocedural states: Secondary | ICD-10-CM

## 2016-12-07 DIAGNOSIS — Z89512 Acquired absence of left leg below knee: Secondary | ICD-10-CM

## 2016-12-07 DIAGNOSIS — Z8701 Personal history of pneumonia (recurrent): Secondary | ICD-10-CM

## 2016-12-07 DIAGNOSIS — E739 Lactose intolerance, unspecified: Secondary | ICD-10-CM | POA: Diagnosis present

## 2016-12-07 DIAGNOSIS — Z940 Kidney transplant status: Secondary | ICD-10-CM | POA: Diagnosis not present

## 2016-12-07 DIAGNOSIS — E039 Hypothyroidism, unspecified: Secondary | ICD-10-CM | POA: Diagnosis present

## 2016-12-07 DIAGNOSIS — K319 Disease of stomach and duodenum, unspecified: Secondary | ICD-10-CM | POA: Diagnosis not present

## 2016-12-07 DIAGNOSIS — N183 Chronic kidney disease, stage 3 (moderate): Secondary | ICD-10-CM | POA: Diagnosis present

## 2016-12-07 DIAGNOSIS — E1122 Type 2 diabetes mellitus with diabetic chronic kidney disease: Secondary | ICD-10-CM | POA: Diagnosis present

## 2016-12-07 DIAGNOSIS — Z8673 Personal history of transient ischemic attack (TIA), and cerebral infarction without residual deficits: Secondary | ICD-10-CM | POA: Diagnosis not present

## 2016-12-07 DIAGNOSIS — Z66 Do not resuscitate: Secondary | ICD-10-CM | POA: Diagnosis not present

## 2016-12-07 DIAGNOSIS — R109 Unspecified abdominal pain: Secondary | ICD-10-CM

## 2016-12-07 DIAGNOSIS — K222 Esophageal obstruction: Secondary | ICD-10-CM | POA: Diagnosis present

## 2016-12-07 DIAGNOSIS — E11649 Type 2 diabetes mellitus with hypoglycemia without coma: Secondary | ICD-10-CM | POA: Diagnosis present

## 2016-12-07 DIAGNOSIS — J189 Pneumonia, unspecified organism: Secondary | ICD-10-CM

## 2016-12-07 DIAGNOSIS — T380X5A Adverse effect of glucocorticoids and synthetic analogues, initial encounter: Secondary | ICD-10-CM | POA: Diagnosis present

## 2016-12-07 DIAGNOSIS — E876 Hypokalemia: Secondary | ICD-10-CM | POA: Diagnosis present

## 2016-12-07 DIAGNOSIS — K220 Achalasia of cardia: Secondary | ICD-10-CM | POA: Diagnosis present

## 2016-12-07 DIAGNOSIS — I272 Pulmonary hypertension, unspecified: Secondary | ICD-10-CM | POA: Diagnosis present

## 2016-12-07 HISTORY — DX: Unspecified atrial fibrillation: I48.91

## 2016-12-07 HISTORY — DX: Atherosclerotic heart disease of native coronary artery without angina pectoris: I25.10

## 2016-12-07 LAB — TROPONIN I: Troponin I: 0.03 ng/mL (ref <0.03)

## 2016-12-07 LAB — BRAIN NATRIURETIC PEPTIDE: B NATRIURETIC PEPTIDE 5: 456 pg/mL — AB (ref 0.0–100.0)

## 2016-12-07 LAB — CBC WITH DIFFERENTIAL/PLATELET
BASOS ABS: 0 10*3/uL (ref 0–0.1)
Basophils Relative: 0 %
Eosinophils Absolute: 0 10*3/uL (ref 0–0.7)
Eosinophils Relative: 0 %
HCT: 34 % — ABNORMAL LOW (ref 35.0–47.0)
HEMOGLOBIN: 11.2 g/dL — AB (ref 12.0–16.0)
LYMPHS ABS: 0.4 10*3/uL — AB (ref 1.0–3.6)
LYMPHS PCT: 4 %
MCH: 30.7 pg (ref 26.0–34.0)
MCHC: 32.8 g/dL (ref 32.0–36.0)
MCV: 93.5 fL (ref 80.0–100.0)
Monocytes Absolute: 0.2 10*3/uL (ref 0.2–0.9)
Monocytes Relative: 2 %
NEUTROS ABS: 8.8 10*3/uL — AB (ref 1.4–6.5)
NEUTROS PCT: 94 %
PLATELETS: 199 10*3/uL (ref 150–440)
RBC: 3.64 MIL/uL — AB (ref 3.80–5.20)
RDW: 16 % — AB (ref 11.5–14.5)
WBC: 9.4 10*3/uL (ref 3.6–11.0)

## 2016-12-07 LAB — COMPREHENSIVE METABOLIC PANEL
ALBUMIN: 3.1 g/dL — AB (ref 3.5–5.0)
ALT: 13 U/L — ABNORMAL LOW (ref 14–54)
ANION GAP: 4 — AB (ref 5–15)
AST: 24 U/L (ref 15–41)
Alkaline Phosphatase: 63 U/L (ref 38–126)
BILIRUBIN TOTAL: 0.8 mg/dL (ref 0.3–1.2)
BUN: 63 mg/dL — AB (ref 6–20)
CHLORIDE: 98 mmol/L — AB (ref 101–111)
CO2: 33 mmol/L — AB (ref 22–32)
Calcium: 9.1 mg/dL (ref 8.9–10.3)
Creatinine, Ser: 2.59 mg/dL — ABNORMAL HIGH (ref 0.44–1.00)
GFR calc Af Amer: 19 mL/min — ABNORMAL LOW (ref >60)
GFR calc non Af Amer: 17 mL/min — ABNORMAL LOW (ref >60)
GLUCOSE: 120 mg/dL — AB (ref 65–99)
POTASSIUM: 6.3 mmol/L — AB (ref 3.5–5.1)
SODIUM: 135 mmol/L (ref 135–145)
Total Protein: 5.7 g/dL — ABNORMAL LOW (ref 6.5–8.1)

## 2016-12-07 LAB — LIPASE, BLOOD: Lipase: 17 U/L (ref 11–51)

## 2016-12-07 LAB — PROTIME-INR
INR: 5.51
PROTHROMBIN TIME: 51.7 s — AB (ref 11.4–15.2)

## 2016-12-07 LAB — LACTIC ACID, PLASMA: LACTIC ACID, VENOUS: 1 mmol/L (ref 0.5–1.9)

## 2016-12-07 LAB — APTT: aPTT: 50 seconds — ABNORMAL HIGH (ref 24–36)

## 2016-12-07 LAB — GLUCOSE, CAPILLARY: GLUCOSE-CAPILLARY: 118 mg/dL — AB (ref 65–99)

## 2016-12-07 MED ORDER — INSULIN ASPART 100 UNIT/ML ~~LOC~~ SOLN
0.0000 [IU] | Freq: Three times a day (TID) | SUBCUTANEOUS | Status: DC
  Administered 2016-12-09: 2 [IU] via SUBCUTANEOUS
  Administered 2016-12-10: 1 [IU] via SUBCUTANEOUS
  Administered 2016-12-10: 2 [IU] via SUBCUTANEOUS
  Administered 2016-12-11: 3 [IU] via SUBCUTANEOUS
  Administered 2016-12-11 (×2): 2 [IU] via SUBCUTANEOUS
  Filled 2016-12-07 (×2): qty 2
  Filled 2016-12-07: qty 3
  Filled 2016-12-07: qty 2
  Filled 2016-12-07: qty 1
  Filled 2016-12-07: qty 2

## 2016-12-07 MED ORDER — WARFARIN - PHARMACIST DOSING INPATIENT: Freq: Every day | Status: DC

## 2016-12-07 MED ORDER — SODIUM CHLORIDE 0.9 % IV SOLN
Freq: Once | INTRAVENOUS | Status: AC
  Administered 2016-12-07: 19:00:00 via INTRAVENOUS

## 2016-12-07 MED ORDER — DEXTROSE 50 % IV SOLN
1.0000 | Freq: Once | INTRAVENOUS | Status: AC
  Administered 2016-12-07: 50 mL via INTRAVENOUS
  Filled 2016-12-07: qty 50

## 2016-12-07 MED ORDER — PREDNISONE 10 MG PO TABS
5.0000 mg | ORAL_TABLET | Freq: Every morning | ORAL | Status: DC
  Administered 2016-12-08 – 2016-12-09 (×2): 5 mg via ORAL
  Filled 2016-12-07 (×2): qty 1

## 2016-12-07 MED ORDER — ALBUTEROL SULFATE (2.5 MG/3ML) 0.083% IN NEBU: 2.5000 mg | INHALATION_SOLUTION | RESPIRATORY_TRACT | Status: DC | PRN

## 2016-12-07 MED ORDER — INSULIN GLARGINE 100 UNIT/ML ~~LOC~~ SOLN
7.0000 [IU] | SUBCUTANEOUS | Status: DC
  Administered 2016-12-08 – 2016-12-09 (×2): 7 [IU] via SUBCUTANEOUS
  Filled 2016-12-07 (×2): qty 0.07

## 2016-12-07 MED ORDER — ACETAMINOPHEN 325 MG PO TABS
650.0000 mg | ORAL_TABLET | Freq: Four times a day (QID) | ORAL | Status: DC | PRN
  Administered 2016-12-12: 650 mg via ORAL
  Filled 2016-12-07: qty 2

## 2016-12-07 MED ORDER — ESCITALOPRAM OXALATE 10 MG PO TABS
10.0000 mg | ORAL_TABLET | Freq: Every day | ORAL | Status: DC
  Administered 2016-12-08 – 2016-12-16 (×9): 10 mg via ORAL
  Filled 2016-12-07 (×9): qty 1

## 2016-12-07 MED ORDER — HYOSCYAMINE SULFATE 0.125 MG PO TBDP
0.1250 mg | ORAL_TABLET | ORAL | Status: DC | PRN
  Filled 2016-12-07: qty 1

## 2016-12-07 MED ORDER — TACROLIMUS 1 MG PO CAPS: 1.0000 mg | ORAL_CAPSULE | ORAL | Status: DC

## 2016-12-07 MED ORDER — HEPARIN SODIUM (PORCINE) 5000 UNIT/ML IJ SOLN: 5000.0000 [IU] | Freq: Three times a day (TID) | INTRAMUSCULAR | Status: DC

## 2016-12-07 MED ORDER — MYCOPHENOLATE SODIUM 180 MG PO TBEC
360.0000 mg | DELAYED_RELEASE_TABLET | Freq: Two times a day (BID) | ORAL | Status: DC
  Administered 2016-12-08 – 2016-12-16 (×17): 360 mg via ORAL
  Filled 2016-12-07 (×18): qty 2

## 2016-12-07 MED ORDER — ATORVASTATIN CALCIUM 10 MG PO TABS
10.0000 mg | ORAL_TABLET | ORAL | Status: DC
Start: 2016-12-08 — End: 2016-12-16
  Administered 2016-12-08 – 2016-12-16 (×5): 10 mg via ORAL
  Filled 2016-12-07 (×5): qty 1

## 2016-12-07 MED ORDER — SODIUM CHLORIDE 0.9% FLUSH
3.0000 mL | Freq: Two times a day (BID) | INTRAVENOUS | Status: DC
  Administered 2016-12-08 – 2016-12-16 (×13): 3 mL via INTRAVENOUS

## 2016-12-07 MED ORDER — MIRTAZAPINE 15 MG PO TABS
7.5000 mg | ORAL_TABLET | Freq: Every day | ORAL | Status: DC
  Administered 2016-12-08 – 2016-12-15 (×9): 7.5 mg via ORAL
  Filled 2016-12-07 (×9): qty 1

## 2016-12-07 MED ORDER — INSULIN ASPART 100 UNIT/ML ~~LOC~~ SOLN
4.0000 [IU] | Freq: Once | SUBCUTANEOUS | Status: AC
  Administered 2016-12-07: 4 [IU] via INTRAVENOUS
  Filled 2016-12-07: qty 4

## 2016-12-07 MED ORDER — POLYETHYLENE GLYCOL 3350 17 G PO PACK: 17.0000 g | PACK | Freq: Every day | ORAL | Status: DC | PRN

## 2016-12-07 MED ORDER — LEVOTHYROXINE SODIUM 112 MCG PO TABS
112.0000 ug | ORAL_TABLET | Freq: Every day | ORAL | Status: DC
  Administered 2016-12-08 – 2016-12-16 (×8): 112 ug via ORAL
  Filled 2016-12-07 (×8): qty 1

## 2016-12-07 MED ORDER — IOPAMIDOL (ISOVUE-300) INJECTION 61%
15.0000 mL | INTRAVENOUS | Status: AC
  Administered 2016-12-07: 15 mL via ORAL

## 2016-12-07 MED ORDER — ONDANSETRON HCL 4 MG/2ML IJ SOLN
4.0000 mg | Freq: Four times a day (QID) | INTRAMUSCULAR | Status: DC | PRN
  Administered 2016-12-08 – 2016-12-15 (×4): 4 mg via INTRAVENOUS
  Filled 2016-12-07 (×4): qty 2

## 2016-12-07 MED ORDER — ONDANSETRON HCL 4 MG PO TABS: 4.0000 mg | ORAL_TABLET | Freq: Four times a day (QID) | ORAL | Status: DC | PRN

## 2016-12-07 MED ORDER — FERROUS SULFATE 325 (65 FE) MG PO TABS
325.0000 mg | ORAL_TABLET | Freq: Every day | ORAL | Status: DC
  Administered 2016-12-08 – 2016-12-10 (×3): 325 mg via ORAL
  Filled 2016-12-07 (×3): qty 1

## 2016-12-07 MED ORDER — PANTOPRAZOLE SODIUM 40 MG PO TBEC
40.0000 mg | DELAYED_RELEASE_TABLET | Freq: Every day | ORAL | Status: DC
  Administered 2016-12-08 – 2016-12-10 (×3): 40 mg via ORAL
  Filled 2016-12-07 (×3): qty 1

## 2016-12-07 MED ORDER — TACROLIMUS 1 MG PO CAPS
1.0000 mg | ORAL_CAPSULE | Freq: Every day | ORAL | Status: DC
  Administered 2016-12-08 (×2): 1 mg via ORAL
  Filled 2016-12-07 (×3): qty 1

## 2016-12-07 MED ORDER — SODIUM CHLORIDE 0.9 % IV SOLN
INTRAVENOUS | Status: DC
  Administered 2016-12-07 – 2016-12-08 (×2): via INTRAVENOUS

## 2016-12-07 MED ORDER — ACETAMINOPHEN 650 MG RE SUPP: 650.0000 mg | Freq: Four times a day (QID) | RECTAL | Status: DC | PRN

## 2016-12-07 MED ORDER — HYDROCODONE-ACETAMINOPHEN 5-325 MG PO TABS
2.0000 | ORAL_TABLET | Freq: Two times a day (BID) | ORAL | Status: DC | PRN
  Administered 2016-12-15 – 2016-12-16 (×2): 2 via ORAL
  Filled 2016-12-07 (×2): qty 2

## 2016-12-07 MED ORDER — TACROLIMUS 1 MG PO CAPS
2.0000 mg | ORAL_CAPSULE | Freq: Every morning | ORAL | Status: DC
  Administered 2016-12-08 – 2016-12-09 (×2): 2 mg via ORAL
  Filled 2016-12-07 (×2): qty 2

## 2016-12-07 MED ORDER — ONDANSETRON HCL 4 MG/2ML IJ SOLN
4.0000 mg | Freq: Once | INTRAMUSCULAR | Status: AC
  Administered 2016-12-07: 4 mg via INTRAVENOUS
  Filled 2016-12-07: qty 2

## 2016-12-07 NOTE — ED Notes (Signed)
Pt is a difficult stick - IV consult placed

## 2016-12-07 NOTE — ED Notes (Signed)
Critical troponin and K+ reported to Dr Cinda Quest

## 2016-12-07 NOTE — ED Notes (Signed)
Critical PT-INR reported to Dr Archie Balboa

## 2016-12-07 NOTE — ED Notes (Signed)
Pt reports that she has vomiting for the last 3 days (vomited 4 times in 24 hours) - pt has not eaten in 24 hours - pt reports loose stools for the past 3-4 weeks (1 in the last 24 hours black and tarry in color) - pt has history of constipation and loose stools - pt was in [facility] for the last 12 days and was discharged Friday (dx pneumonia and flu) - pt has a productive cough (sometimes precedes the vomiting) - pt had thoracentesis one week ago - family feels she is dehydrated and weaker than usual - pt is on cont. O2 at 2L via n/c

## 2016-12-07 NOTE — H&P (Signed)
Elizabeth [name]    MR#:  [id number]  DATE OF BIRTH:  [birth year]  DATE OF ADMISSION:  12/07/2016  PRIMARY CARE PHYSICIAN: Donetta Potts, MD   REQUESTING/REFERRING PHYSICIAN: Dr. Cinda Quest  CHIEF COMPLAINT:   Chief Complaint  Patient presents with  . Emesis    HISTORY OF PRESENT ILLNESS:  Elizabeth [name]  is a 76 y.o. female with a known history of Hypertension, diabetes, chronic diastolic CHF, kidney transplant on immunosuppressants, CKD stage III, history of PE presents to the emergency room from home due to weakness, vomiting and diarrhea. Patient does have chronic on and off nausea, vomiting and diarrhea but it is significantly worse. She was recently admitted to [name] in [place] for similar complaints along with influenza B and shortness of breath. She had right thoracentesis with 1 L transudate removed. Treated with Tamiflu and Levaquin. Finished antibiotics in the hospital. She continued to have nausea and vomiting along with diarrhea on day of discharge but was told this was due to gastroparesis and influenza. He was continued and remain the same. She has not taken her torsemide as she was concerned about dehydration. Other medications have remained the same. With the home health nurse blood pressure was noticed to be low in systolic of 40J. Here in the emergency room patient has been found to have acute kidney injury. Chest x-ray shows right-sided moderate pleural effusion and a small left pleural effusion. The last x-ray at [facility] showed bilateral moderate pleural effusions. She is also on oxygen at home since recent admission.  Normally her weight is 130 pounds. Today she is 117 pounds.  PAST MEDICAL HISTORY:   Past Medical History:  Diagnosis Date  . Adenomatous polyp   . Anemia   . 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 Bradycardia   . Carotid artery stenosis    mild  . Carotid bruit    left  . Chronic kidney disease    s/p cadaveric renal transplant in 2001  . Colitis   . Compression fracture of L4 lumbar vertebra (HCC)   . Coronary artery disease   . CVA (cerebral infarction)   . Diabetes mellitus   . Diabetic retinopathy   . Esophageal candidiasis ([place])   . Flu 11/22/2016  . Fracture of right maxilla 09/21/2012  . Fx ankle   . Fx wrist   . Gastritis   . GERD (gastroesophageal reflux disease)   . H/O immunosuppressive therapy   . History of orthostatic hypotension   . Hyperlipidemia   . Hyperparathyroidism   . Hypothyroidism   . IBS (irritable bowel syndrome)   . Ischemic colitis ([place])   . Lactose intolerance   . Leg ulcer ([place])    from poor fitting prosthetic  . Melanoma ([place])   . Nephropathy   . 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 Neuropathy   . Osteoporosis    recurrent fractures  . Pancreatic cyst   . Pneumonia 11/2016  . Pulmonary embolism ([place])   . PVD (peripheral vascular disease) ([place])   . Renal artery stenosis ([place])   . 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 Renal osteodystrophy   . Right orbit fracture ([place]) 09/21/2012  . Stroke ([place])   . TBI (traumatic brain injury) ([place]) 09/30/2012  . Tubular adenoma   . Zygoma  fracture ([place]) 09/21/2012    PAST SURGICAL HISTORY:   Past Surgical History:  Procedure Laterality Date  . ABDOMINAL HYSTERECTOMY    . APPENDECTOMY    . BELOW KNEE LEG AMPUTATION     left, char[place] joint from neuropathy  . CATARACT EXTRACTION    . CESAREAN SECTION     x 2  . CHOLECYSTECTOMY    . COLONOSCOPY  01/30/2012   Procedure: COLONOSCOPY;  Surgeon: Milus Banister, MD;  Location: [facility];  Service: Endoscopy;  Laterality: N/A;  . COLONOSCOPY W/ BIOPSIES AND POLYPECTOMY  02/18/2009   adenomatous polyps, diverticulosis, internal hemorrhoids  . CORONARY ARTERY BYPASS GRAFT    .  ESOPHAGOGASTRODUODENOSCOPY  05/02/2012   Procedure: ESOPHAGOGASTRODUODENOSCOPY (EGD);  Surgeon: Gatha Mayer, MD;  Location: [facility];  Service: Endoscopy;  Laterality: N/A;  . EUS  02/04/2010   w/FNA, pancreatic cyst  . IR THORACENTESIS ASP PLEURAL SPACE W/IMG GUIDE  11/24/2016  . KIDNEY TRANSPLANT  08/15/2000   [facility] in [place]  . THORACENTESIS  07/2016, 08/2016, 09/2016   Ordered by Dr. Vella Kohler. Surg: Dr.Green, Dr. Anselm Pancoast  . THYROID SURGERY    . TONSILLECTOMY    . UPPER GASTROINTESTINAL ENDOSCOPY  12/01/2009  . VITRECTOMY     right eye    SOCIAL HISTORY:   Social History  Substance Use Topics  . Smoking status: Never Smoker  . Smokeless tobacco: Never Used  . Alcohol use No    FAMILY HISTORY:   Family History  Problem Relation Age of Onset  . Stroke Mother 48  . Heart attack Mother   . Heart attack Father 77  . Breast cancer Sister   . Colon cancer Neg Hx   . Diabetes Neg Hx     DRUG ALLERGIES:   Allergies  Allergen Reactions  . Azithromycin Other (See Comments)    Upset stomach  . Erythromycin Nausea And Vomiting  . Fentanyl Nausea And Vomiting  . Percocet [Oxycodone-Acetaminophen] Nausea And Vomiting    REVIEW OF SYSTEMS:   Review of Systems  Constitutional: Positive for malaise/fatigue and weight loss. Negative for chills and fever.  HENT: Negative for hearing loss and nosebleeds.   Eyes: Negative for blurred vision, double vision and pain.  Respiratory: Positive for cough. Negative for hemoptysis, sputum production, shortness of breath and wheezing.   Cardiovascular: Negative for chest pain, palpitations and orthopnea.  Gastrointestinal: Positive for diarrhea, nausea and vomiting. Negative for abdominal pain and constipation.  Genitourinary: Negative for dysuria and hematuria.  Musculoskeletal: Positive for back pain. Negative for falls and myalgias.  Skin: Negative for rash.  Neurological: Positive for dizziness and weakness. Negative for  tremors, sensory change, speech change, focal weakness, seizures and headaches.  Endo/Heme/Allergies: Does not bruise/bleed easily.  Psychiatric/Behavioral: Negative for depression and memory loss. The patient is not nervous/anxious.     MEDICATIONS AT HOME:   Prior to Admission medications   Medication Sig Start Date End Date Taking? Authorizing Provider  acetaminophen (TYLENOL) 500 MG tablet Take 1,000 mg by mouth every 6 (six) hours as needed for moderate pain or headache.    Yes Historical Provider, MD  atorvastatin (LIPITOR) 10 MG tablet Take 1 tablet (10 mg total) by mouth every other day. 10/12/12  Yes Daniel J Angiulli, PA-C  cholecalciferol (VITAMIN D) 1000 UNITS tablet Take 2,000 Units by mouth daily.    Yes Historical Provider, MD  docusate sodium (COLACE) 100 MG capsule Take 100 mg by mouth daily as needed for moderate constipation.  Yes Historical Provider, MD  escitalopram (LEXAPRO) 10 MG tablet Take 10 mg by mouth daily.   Yes Historical Provider, MD  ferrous sulfate (SM IRON) 325 (65 FE) MG tablet Take 325 mg by mouth daily.    Yes Historical Provider, MD  HYDROcodone-acetaminophen (NORCO/VICODIN) 5-325 MG per tablet Take 2 tablets by mouth 2 (two) times daily as needed for moderate pain.    Yes Historical Provider, MD  hyoscyamine (ANASPAZ) 0.125 MG TBDP disintergrating tablet Place 0.125 mg under the tongue as needed for cramping.    Yes Historical Provider, MD  insulin glargine (LANTUS) 100 UNIT/ML injection Inject 7 Units into the skin every morning.  10/12/12  Yes Daniel J Angiulli, PA-C  insulin lispro (HUMALOG) 100 UNIT/ML injection Inject 2-9 Units into the skin 4 (four) times daily -  before meals and at bedtime. Per sliding scale.   Yes Historical Provider, MD  levothyroxine (SYNTHROID, LEVOTHROID) 112 MCG tablet Take 112 mcg by mouth daily before breakfast.   Yes Historical Provider, MD  magnesium gluconate (MAGONATE) 500 MG tablet Take 1,000 mg by mouth daily.    Yes  Historical Provider, MD  mirtazapine (REMERON) 7.5 MG tablet Take 7.5 mg by mouth at bedtime.  02/15/13  Yes Historical Provider, MD  Multiple Vitamin (MULTIVITAMIN WITH MINERALS) TABS Take 1 tablet by mouth daily.   Yes Historical Provider, MD  mycophenolate (MYFORTIC) 360 MG TBEC Take 360 mg by mouth 2 (two) times daily.    Yes Historical Provider, MD  nitroGLYCERIN (NITROSTAT) 0.4 MG SL tablet Place 1 tablet (0.4 mg total) under the tongue every 5 (five) minutes as needed for chest pain. 11/02/12  Yes Minna Merritts, MD  omeprazole (PRILOSEC) 20 MG capsule Take 20 mg by mouth at bedtime.   Yes Historical Provider, MD  ondansetron (ZOFRAN) 4 MG tablet Take 4 mg by mouth every 8 (eight) hours as needed for nausea or vomiting.    Yes Historical Provider, MD  potassium chloride (K-DUR) 10 MEQ tablet Take 10 mEq by mouth daily at 12 noon.    Yes Historical Provider, MD  predniSONE (DELTASONE) 5 MG tablet Take 5 mg by mouth every morning.    Yes Historical Provider, MD  promethazine (PHENERGAN) 25 MG tablet Take 1 tablet (25 mg total) by mouth every 6 (six) hours as needed for nausea or vomiting. 11/02/16  Yes Minna Merritts, MD  tacrolimus (PROGRAF) 0.5 MG capsule Take 1-2 mg by mouth See admin instructions. Take 2mg  in the am & 1mg  at night    Yes Historical Provider, MD  torsemide (DEMADEX) 20 MG tablet TAKE ONE TABLET BY MOUTH TWICE DAILY AS NEEDED Patient taking differently: Take 10 mg by mouth at bedtime alternating with 20 mg by mouth at bedtime. 05/23/16  Yes Minna Merritts, MD  warfarin (COUMADIN) 2.5 MG tablet TAKE AS DIRECTED BY COUMADIN CLINIC Patient taking differently: Take 5 mg by mouth daily on Sunday, Tuesday and Thursday. Take 2.5 mg by mouth daily on all other days 11/11/16  Yes Minna Merritts, MD  midodrine (PROAMATINE) 10 MG tablet Take 1 tablet (10 mg total) by mouth 3 (three) times daily as needed. Patient not taking: Reported on 12/07/2016 06/18/14   Minna Merritts, MD      VITAL SIGNS:  Blood pressure (!) 126/27, pulse (!) 55, temperature 99.3 F (37.4 C), temperature source Oral, resp. rate 18, height 5\' 3"  (1.6 m), weight 53.1 kg (117 lb), SpO2 100 %.  PHYSICAL EXAMINATION:  Physical  Exam  GENERAL:  76 y.o.-year-old patient lying in the bed with no acute distress.  EYES: Pupils equal, round, reactive to light and accommodation. No scleral icterus. Extraocular muscles intact.  HEENT: Head atraumatic, normocephalic. Oropharynx and nasopharynx clear. No oropharyngeal erythema, moist oral mucosa  NECK:  Supple, no jugular venous distention. No thyroid enlargement, no tenderness.  LUNGS: Normal breath sounds bilaterally, no wheezing, rales, rhonchi. No use of accessory muscles of respiration.  CARDIOVASCULAR: S1, S2 normal. No murmurs, rubs, or gallops.  ABDOMEN: Soft, nontender, nondistended. Bowel sounds present. No organomegaly or mass.  EXTREMITIES: Left BKA with prosthetic leg. Right lower extremity and leg splint.  NEUROLOGIC: Cranial nerves II through XII are intact. No focal Motor or sensory deficits appreciated b/l PSYCHIATRIC: The patient is alert and oriented x 3. Good affect.  SKIN: No obvious rash, lesion, or ulcer.   LABORATORY PANEL:   CBC  Recent Labs Lab 12/07/16 1852  WBC 9.4  HGB 11.2*  HCT 34.0*  PLT 199   ------------------------------------------------------------------------------------------------------------------  Chemistries   Recent Labs Lab 12/07/16 1852  NA 135  K 6.3*  CL 98*  CO2 33*  GLUCOSE 120*  BUN 63*  CREATININE 2.59*  CALCIUM 9.1  AST 24  ALT 13*  ALKPHOS 63  BILITOT 0.8   ------------------------------------------------------------------------------------------------------------------  Cardiac Enzymes  Recent Labs Lab 12/07/16 1852  TROPONINI 0.03*   ------------------------------------------------------------------------------------------------------------------  RADIOLOGY:  Dg Chest  Portable 1 View  Result Date: 12/07/2016 CLINICAL DATA:  Acute onset of vomiting. Subacute onset of loose stools. Initial encounter. EXAM: PORTABLE CHEST 1 VIEW COMPARISON:  Chest radiograph performed 11/29/2016 FINDINGS: Moderate right and small left pleural effusions are seen, with associated atelectasis. Mild vascular congestion is noted. No pneumothorax is identified. The cardiomediastinal silhouette is mildly enlarged. Calcification is noted at the heart. The patient is status post median sternotomy. No acute osseous abnormalities are identified. IMPRESSION: Moderate right and small left pleural effusions, with associated atelectasis. Mild vascular congestion and mild cardiomegaly. Electronically Signed   By: Garald Balding M.D.   On: 12/07/2016 18:43     IMPRESSION AND PLAN:   * AKI Over CKD stage III in a patient who had renal transplant on immunosuppressants. With associated hyperkalemia This is likely due to dehydration from her vomiting and diarrhea. We'll start her on gentle IV fluids at 50 ML per hour. She did receive 500 ML bolus in the ER. Concern regarding congestive heart failure. Consult nephrology. Repeat labs in the morning. She has received D50, IV insulin in the emergency room. Repeat labs in 2 hours.  * Vomiting and diarrhea. These are chronic symptoms but are worse today. She did get a CT scan of the abdomen in emergency room which is pending. She has no abdominal pain or tenderness. This could be likely gastroenteritis. If no improvement will check for C. difficile.  * Chronic diastolic CHF. Patient is not short of breath. Has mild vascular congestion on chest x-ray. Will need to watch out for any worsening fluid overload. Consult cardiology.  * Diabetes mellitus. Continue Levemir. Sliding scale insulin. Diabetic diet.  * History of PE on Coumadin. Pharmacy consult.  All the records are reviewed and case discussed with ED provider. Management plans discussed with the  patient, family and they are in agreement.  CODE STATUS: FULL CODE  TOTAL TIME TAKING CARE OF THIS PATIENT: 40 minutes.   Hillary Bow R M.D on 12/07/2016 at 9:06 PM  Between 7am to 6pm - Pager - [phone]  After  6pm go to [email or web address] - password [email or web address]  [facility]  Office  [phone]  CC: Primary care physician; Donetta Potts, MD  Note: This dictation was prepared with Dragon dictation along with smaller phrase technology. Any transcriptional errors that result from this process are unintentional.

## 2016-12-07 NOTE — ED Notes (Addendum)
Attempted to call report and was told that the nurse was on medication pass and would have to call me back

## 2016-12-07 NOTE — ED Notes (Signed)
Pt had BM.  Pt clean, briefs/linens changed

## 2016-12-07 NOTE — Progress Notes (Signed)
ANTICOAGULATION CONSULT NOTE - Initial Consult  Pharmacy Consult for Warfarin  Indication: atrial fibrillation  Allergies  Allergen Reactions  . Azithromycin Other (See Comments)    Upset stomach  . Erythromycin Nausea And Vomiting  . Fentanyl Nausea And Vomiting  . Percocet [Oxycodone-Acetaminophen] Nausea And Vomiting    Patient Measurements: Height: 5\' 3"  (160 cm) Weight: 117 lb (53.1 kg) IBW/kg (Calculated) : 52.4  Vital Signs: Temp: 99.3 F (37.4 C) (04/18 1752) Temp Source: Oral (04/18 1752) BP: 111/98 (04/18 2200) Pulse Rate: 52 (04/18 2200)  Labs:  Recent Labs  12/07/16 1852  HGB 11.2*  HCT 34.0*  PLT 199  APTT 50*  LABPROT 51.7*  INR 5.51*  CREATININE 2.59*  TROPONINI 0.03*    Estimated Creatinine Clearance: 15 mL/min (A) (by C-G formula based on SCr of 2.59 mg/dL (H)).   Medical History: Past Medical History:  Diagnosis Date  . Adenomatous polyp   . Anemia   . 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 Bradycardia   . Carotid artery stenosis    mild  . Carotid bruit    left  . Chronic kidney disease    s/p cadaveric renal transplant in 2001  . Colitis   . Compression fracture of L4 lumbar vertebra (HCC)   . Coronary artery disease   . CVA (cerebral infarction)   . Diabetes mellitus   . Diabetic retinopathy   . Esophageal candidiasis ([place])   . Flu 11/22/2016  . Fracture of right maxilla 09/21/2012  . Fx ankle   . Fx wrist   . Gastritis   . GERD (gastroesophageal reflux disease)   . H/O immunosuppressive therapy   . History of orthostatic hypotension   . Hyperlipidemia   . Hyperparathyroidism   . Hypothyroidism   . IBS (irritable bowel syndrome)   . Ischemic colitis ([place])   . Lactose intolerance   . Leg ulcer ([place])    from poor fitting prosthetic  . Melanoma ([place])   . Nephropathy   . 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 Neuropathy   .  Osteoporosis    recurrent fractures  . Pancreatic cyst   . Pneumonia 11/2016  . Pulmonary embolism ([place])   . PVD (peripheral vascular disease) ([place])   . Renal artery stenosis ([place])   . 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 Renal osteodystrophy   . Right orbit fracture ([place]) 09/21/2012  . Stroke ([place])   . TBI (traumatic brain injury) ([place]) 09/30/2012  . Tubular adenoma   . Zygoma fracture ([place]) 09/21/2012    Assessment: 76 yo female with PMH history of A. Fib.  Pharmacy consulted for warfarin dosing and monitoring. INR supratherapeutic at 5.51 on admission.   Home Regimen: Warfarin 5mg  Sun, Tues, Thurs                             Warfarin 2.5mg  Mon, Weds, Fri, Sat   DATE  INR  DOSE 4/18  5.51  HELD    Goal of Therapy:  INR 2-3 Monitor platelets by anticoagulation protocol: Yes   Plan:  INR supratherapeutic. Will hold warfarin dose for now. Will recheck INR with AM labs.   Pernell Dupre, PharmD, BCPS Clinical Pharmacist 12/07/2016 10:15 PM

## 2016-12-07 NOTE — ED Notes (Signed)
CBG 118 

## 2016-12-07 NOTE — ED Provider Notes (Signed)
[facility] Emergency Department Provider Note   ____________________________________________   First MD Initiated Contact with Patient 12/07/16 1804     (approximate)  I have reviewed the triage vital signs and the nursing notes.   HISTORY  Chief Complaint Emesis    HPI Elizabeth [name] is a 76 y.o. female who comes in with her family. Reportedly she was in [facility] 2 weeks ago with fluid pneumonia. She began vomiting while she was there but was sent home. At home she initially improved and got stronger and then began getting weaker again. She is not eating much and reports she gets full very easily. Today she developed a temperature of 99. She did not have a temperature above that at any time in the last few days. Family additionally reports that she's had thoracentesis for 4 pleural effusion. 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  Past Medical History:  Diagnosis Date  . Adenomatous polyp   . Anemia   . 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 Bradycardia   . Carotid artery stenosis    mild  . Carotid bruit    left  . Chronic kidney disease    s/p cadaveric renal transplant in 2001  . Colitis   . Compression fracture of L4 lumbar vertebra (HCC)   . Coronary artery disease   . CVA (cerebral infarction)   . Diabetes mellitus   . Diabetic retinopathy   . Esophageal candidiasis ([place])   . Flu 11/22/2016  . Fracture of right maxilla 09/21/2012  . Fx ankle   . Fx wrist   . Gastritis   . GERD (gastroesophageal reflux disease)   . H/O immunosuppressive therapy   . History of orthostatic hypotension   . Hyperlipidemia   . Hyperparathyroidism   . Hypothyroidism   . IBS (irritable bowel syndrome)   . Ischemic colitis ([place])   . Lactose intolerance   . Leg ulcer ([place])    from poor fitting prosthetic  .  Melanoma ([place])   . Nephropathy   . 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 Neuropathy   . Osteoporosis    recurrent fractures  . Pancreatic cyst   . Pneumonia 11/2016  . Pulmonary embolism ([place])   . PVD (peripheral vascular disease) ([place])   . Renal artery stenosis ([place])   . 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 Renal osteodystrophy   . Right orbit fracture ([place]) 09/21/2012  . Stroke ([place])   . TBI (traumatic brain injury) ([place]) 09/30/2012  . Tubular adenoma   . Zygoma fracture ([place]) 09/21/2012    Patient Active Problem List   Diagnosis Date Noted  . Kidney transplant recipient   . Herpes simplex   . Failure to thrive (child)   . Hx of BKA, left ([place])   . Acute on chronic respiratory failure ([place]) 11/30/2016  . Hypoxia   . CHF (congestive heart failure) ([place])   . S/P thoracentesis   . Influenza with pneumonia   . CKD (chronic kidney disease) stage 4, GFR 15-29 ml/min (HCC)   . Chronic anticoagulation   . Community acquired pneumonia of right lower lobe of lung ([place])   . Febrile illness 11/22/2016  . Pleural effusion, right 09/30/2016  . Hyperlipidemia 06/18/2014  . Altered mental status 11/19/2013  . Acute encephalopathy 11/19/2013  . Acute on chronic  diastolic heart failure ([place]) 10/21/2013  . History of renal transplant 10/21/2013  . Encounter for therapeutic drug monitoring 09/17/2013  . UTI (lower urinary tract infection) 09/27/2012  . Concussion, unspecified 09/21/2012  . 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 Orthostatic hypotension 06/06/2012  . 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 Constipation 10/11/2011  . Nocturnal polyuria 03/31/2011  . PAF (paroxysmal atrial fibrillation) ([place]) 01/28/2011  . Encounter for anticoagulation discussion and counseling 01/28/2011  . CVA (cerebral vascular accident) ([place]) 01/28/2011  . DVT of upper extremity (deep vein thrombosis) ([place]) 01/28/2011  . Edema 01/10/2011  . SINUS BRADYCARDIA 12/14/2009   . GERD 12/08/2009  . DIZZINESS 05/20/2009  . Type 2 diabetes mellitus with stage 4 chronic kidney disease, without long-term current use of insulin ([place]) 02/11/2009  . HTN (hypertension), benign 02/09/2009  . Coronary artery disease involving native coronary artery of native heart without angina pectoris 02/09/2009  . CAROTID BRUITS, BILATERAL 02/09/2009  . 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 Irritable bowel syndrome 08/18/2008  . COLONIC POLYPS, HX OF 08/18/2008    Past Surgical History:  Procedure Laterality Date  . ABDOMINAL HYSTERECTOMY    . APPENDECTOMY    . BELOW KNEE LEG AMPUTATION     left, char[place] joint from neuropathy  . CATARACT EXTRACTION    . CESAREAN SECTION     x 2  . CHOLECYSTECTOMY    . COLONOSCOPY  01/30/2012   Procedure: COLONOSCOPY;  Surgeon: Milus Banister, MD;  Location: [facility];  Service: Endoscopy;  Laterality: N/A;  . COLONOSCOPY W/ BIOPSIES AND POLYPECTOMY  02/18/2009   adenomatous polyps, diverticulosis, internal hemorrhoids  . CORONARY ARTERY BYPASS GRAFT    . ESOPHAGOGASTRODUODENOSCOPY  05/02/2012   Procedure: ESOPHAGOGASTRODUODENOSCOPY (EGD);  Surgeon: Gatha Mayer, MD;  Location: [facility];  Service: Endoscopy;  Laterality: N/A;  . EUS  02/04/2010   w/FNA, pancreatic cyst  . IR THORACENTESIS ASP PLEURAL SPACE W/IMG GUIDE  11/24/2016  . KIDNEY TRANSPLANT  08/15/2000   [facility] in [place]  . THORACENTESIS  07/2016, 08/2016, 09/2016   Ordered by Dr. Vella Kohler. Surg: Dr.Green, Dr. Anselm Pancoast  . THYROID SURGERY    . TONSILLECTOMY    . UPPER GASTROINTESTINAL ENDOSCOPY  12/01/2009  . VITRECTOMY     right eye    Prior to Admission medications   Medication Sig Start Date End Date Taking? Authorizing Provider  acetaminophen (TYLENOL) 500 MG tablet Take 1,000 mg by mouth every 6 (six) hours as needed for moderate pain or headache.     Historical Provider, MD  atorvastatin (LIPITOR) 10 MG  tablet Take 1 tablet (10 mg total) by mouth every other day. 10/12/12   Lavon Paganini Angiulli, PA-C  cholecalciferol (VITAMIN D) 1000 UNITS tablet Take 2,000 Units by mouth daily.     Historical Provider, MD  docusate sodium (COLACE) 100 MG capsule Take 100 mg by mouth daily as needed for moderate constipation.     Historical Provider, MD  escitalopram (LEXAPRO) 10 MG tablet Take 10 mg by mouth daily.    Historical Provider, MD  ferrous sulfate (SM IRON) 325 (65 FE) MG tablet Take 325 mg by mouth daily.     Historical Provider, MD  HYDROcodone-acetaminophen (NORCO/VICODIN) 5-325 MG per tablet Take 2 tablets by mouth 2 (two) times daily as needed for moderate pain.     Historical Provider, MD  hyoscyamine (ANASPAZ) 0.125 MG TBDP disintergrating tablet Place 0.125 mg under the tongue as needed for  cramping.     Historical Provider, MD  insulin glargine (LANTUS) 100 UNIT/ML injection Inject 8 Units into the skin every morning.  10/12/12   Lavon Paganini Angiulli, PA-C  insulin lispro (HUMALOG) 100 UNIT/ML injection Inject 2-9 Units into the skin 4 (four) times daily -  before meals and at bedtime. Per sliding scale.    Historical Provider, MD  levothyroxine (SYNTHROID, LEVOTHROID) 112 MCG tablet Take 112 mcg by mouth daily before breakfast.    Historical Provider, MD  magnesium gluconate (MAGONATE) 500 MG tablet Take 500 mg by mouth 2 (two) times daily.    Historical Provider, MD  midodrine (PROAMATINE) 10 MG tablet Take 1 tablet (10 mg total) by mouth 3 (three) times daily as needed. 06/18/14   Minna Merritts, MD  mirtazapine (REMERON) 7.5 MG tablet Take 7.5 mg by mouth at bedtime.  02/15/13   Historical Provider, MD  Multiple Vitamin (MULTIVITAMIN WITH MINERALS) TABS Take 1 tablet by mouth daily.    Historical Provider, MD  mycophenolate (MYFORTIC) 360 MG TBEC Take 360 mg by mouth 2 (two) times daily.     Historical Provider, MD  nitroGLYCERIN (NITROSTAT) 0.4 MG SL tablet Place 1 tablet (0.4 mg total) under the  tongue every 5 (five) minutes as needed for chest pain. 11/02/12   Minna Merritts, MD  omeprazole (PRILOSEC) 20 MG capsule Take 20 mg by mouth at bedtime.    Historical Provider, MD  ondansetron (ZOFRAN) 4 MG tablet Take 4 mg by mouth every 8 (eight) hours as needed for nausea or vomiting.     Historical Provider, MD  potassium chloride (K-DUR) 10 MEQ tablet Take 10 mEq by mouth daily at 12 noon.     Historical Provider, MD  predniSONE (DELTASONE) 5 MG tablet Take 5 mg by mouth every morning.     Historical Provider, MD  promethazine (PHENERGAN) 25 MG tablet Take 1 tablet (25 mg total) by mouth every 6 (six) hours as needed for nausea or vomiting. 11/02/16   Minna Merritts, MD  tacrolimus (PROGRAF) 0.5 MG capsule Take 1-2 mg by mouth See admin instructions. Take 2mg  in the am & 1mg  at night     Historical Provider, MD  torsemide (DEMADEX) 20 MG tablet TAKE ONE TABLET BY MOUTH TWICE DAILY AS NEEDED Patient taking differently: Take 10 mg by mouth at bedtime alternating with 20 mg by mouth at bedtime. 05/23/16   Minna Merritts, MD  warfarin (COUMADIN) 2.5 MG tablet TAKE AS DIRECTED BY COUMADIN CLINIC Patient taking differently: Take 5 mg by mouth daily on Sunday, Tuesday and Thursday. Take 2.5 mg by mouth daily on all other days 11/11/16   Minna Merritts, MD    Allergies Azithromycin; Erythromycin; Fentanyl; and Percocet [oxycodone-acetaminophen]  Family History  Problem Relation Age of Onset  . Stroke Mother 84  . Heart attack Mother   . Heart attack Father 90  . Breast cancer Sister   . Colon cancer Neg Hx   . Diabetes Neg Hx     Social History Social History  Substance Use Topics  . Smoking status: Never Smoker  . Smokeless tobacco: Never Used  . Alcohol use No    Review of Systems Constitutional:  fever Eyes: No visual changes. ENT: No sore throat. Cardiovascular: Denies chest pain. Respiratory: Denies shortness of breath. Gastrointestinal: No abdominal pain.   nausea,   vomiting.  No diarrhea.  No constipation. Genitourinary: Negative for dysuria. Musculoskeletal: Negative for back pain. Skin: Negative for rash.  Neurological: Negative for headaches, focal weakness or numbness.  10-point ROS otherwise negative.  ____________________________________________   PHYSICAL EXAM:  VITAL SIGNS: ED Triage Vitals  Enc Vitals Group     BP 12/07/16 1750 (!) 126/27     Pulse Rate 12/07/16 1750 (!) 56     Resp 12/07/16 1750 18     Temp 12/07/16 1750 99.3 F (37.4 C)     Temp Source 12/07/16 1750 Oral     SpO2 12/07/16 1750 100 %     Weight 12/07/16 1750 117 lb (53.1 kg)     Height 12/07/16 1750 5\' 3"  (1.6 m)     Head Circumference --      Peak Flow --      Pain Score 12/07/16 1749 0     Pain Loc --      Pain Edu? --      Excl. in [place]? --     Constitutional: Alert and oriented.  Chronically ill-appearing and in no acute distress. Eyes: Conjunctivae are normal. PERRL. EOMI. Head: Atraumatic. Nose: No congestion/rhinnorhea. Mouth/Throat: Mucous membranes are moist.  Oropharynx non-erythematous. Neck: No stridor.   Cardiovascular: Normal rate, regular rhythm. Grossly normal heart sounds.  Good peripheral circulation. Respiratory: Normal respiratory effort.  No retractions. Lungs CTAB. Gastrointestinal: Soft and nontender. No distention. No abdominal bruits. No CVA tenderness. Musculoskeletal: No lower extremity tenderness nor edema.  No joint effusions. Neurologic:  Normal speech and language. No gross focal neurologic deficits are appreciated. No gait instability. Skin:  Skin is warm, dry and intact. No rash noted. Psychiatric: Mood and affect are normal. Speech and behavior are normal.  ____________________________________________   LABS (all labs ordered are listed, but only abnormal results are displayed)  Labs Reviewed  COMPREHENSIVE METABOLIC PANEL - Abnormal; Notable for the following:       Result Value   Potassium 6.3 (*)    Chloride 98  (*)    CO2 33 (*)    Glucose, Bld 120 (*)    BUN 63 (*)    Creatinine, Ser 2.59 (*)    Total Protein 5.7 (*)    Albumin 3.1 (*)    ALT 13 (*)    GFR calc non Af Amer 17 (*)    GFR calc Af Amer 19 (*)    Anion gap 4 (*)    All other components within normal limits  TROPONIN I - Abnormal; Notable for the following:    Troponin I 0.03 (*)    All other components within normal limits  CBC WITH DIFFERENTIAL/PLATELET - Abnormal; Notable for the following:    RBC 3.64 (*)    Hemoglobin 11.2 (*)    HCT 34.0 (*)    RDW 16.0 (*)    Neutro Abs 8.8 (*)    Lymphs Abs 0.4 (*)    All other components within normal limits  CULTURE, BLOOD (ROUTINE X 2)  CULTURE, BLOOD (ROUTINE X 2)  URINE CULTURE  LIPASE, BLOOD  LACTIC ACID, PLASMA  LACTIC ACID, PLASMA  URINALYSIS, COMPLETE (UACMP) WITH MICROSCOPIC  CBG MONITORING, ED   ____________________________________________  EKG  EKG read and interpreted by me shows normal sinus rhythm rate of 65 left axis very irregular baseline within normal limits of that there do not appear to be any acute changes ____________________________________________  RADIOLOGY Study Result   CLINICAL DATA:  Acute onset of vomiting. Subacute onset of loose stools. Initial encounter.  EXAM: PORTABLE CHEST 1 VIEW  COMPARISON:  Chest radiograph performed 11/29/2016  FINDINGS:  Moderate right and small left pleural effusions are seen, with associated atelectasis. Mild vascular congestion is noted. No pneumothorax is identified.  The cardiomediastinal silhouette is mildly enlarged. Calcification is noted at the heart. The patient is status post median sternotomy. No acute osseous abnormalities are identified.  IMPRESSION: Moderate right and small left pleural effusions, with associated atelectasis. Mild vascular congestion and mild cardiomegaly.   Electronically Signed   By: Garald Balding M.D.   On: 12/07/2016 18:43      ____________________________________________   PROCEDURES  Procedure(s) performed:   Procedures  Critical Care performed:  ____________________________________________   INITIAL IMPRESSION / ASSESSMENT AND PLAN / ED COURSE  Pertinent labs & imaging results that were available during my care of the patient were reviewed by me and considered in my medical decision making (see chart for details).        ____________________________________________   FINAL CLINICAL IMPRESSION(S) / ED DIAGNOSES  Final diagnoses:  AKI (acute kidney injury) ([place])  Non-intractable vomiting with nausea, unspecified vomiting type      NEW MEDICATIONS STARTED DURING THIS VISIT:  New Prescriptions   No medications on file     Note:  This document was prepared using Dragon voice recognition software and may include unintentional dictation errors.    Nena Polio, MD 12/07/16 1946

## 2016-12-07 NOTE — ED Triage Notes (Signed)
Pt arrives with complaints of weakness, low BP and vomiting, states she was discharged from [facility] Friday with flu and pneumonia and sent home on 02, states since Monday pt has been vomiting and unable to keep anything down, states some dark stools, pt with family

## 2016-12-08 ENCOUNTER — Encounter: Payer: Self-pay | Admitting: Student

## 2016-12-08 DIAGNOSIS — I480 Paroxysmal atrial fibrillation: Secondary | ICD-10-CM

## 2016-12-08 DIAGNOSIS — N179 Acute kidney failure, unspecified: Principal | ICD-10-CM

## 2016-12-08 DIAGNOSIS — I5032 Chronic diastolic (congestive) heart failure: Secondary | ICD-10-CM

## 2016-12-08 LAB — GLUCOSE, CAPILLARY
Glucose-Capillary: 112 mg/dL — ABNORMAL HIGH (ref 65–99)
Glucose-Capillary: 55 mg/dL — ABNORMAL LOW (ref 65–99)
Glucose-Capillary: 59 mg/dL — ABNORMAL LOW (ref 65–99)
Glucose-Capillary: 67 mg/dL (ref 65–99)
Glucose-Capillary: 74 mg/dL (ref 65–99)
Glucose-Capillary: 84 mg/dL (ref 65–99)
Glucose-Capillary: 89 mg/dL (ref 65–99)
Glucose-Capillary: 96 mg/dL (ref 65–99)

## 2016-12-08 LAB — BASIC METABOLIC PANEL
Anion gap: 2 — ABNORMAL LOW (ref 5–15)
Anion gap: 4 — ABNORMAL LOW (ref 5–15)
BUN: 56 mg/dL — ABNORMAL HIGH (ref 6–20)
BUN: 60 mg/dL — AB (ref 6–20)
CALCIUM: 8.6 mg/dL — AB (ref 8.9–10.3)
CO2: 32 mmol/L (ref 22–32)
CO2: 32 mmol/L (ref 22–32)
Calcium: 8.4 mg/dL — ABNORMAL LOW (ref 8.9–10.3)
Chloride: 100 mmol/L — ABNORMAL LOW (ref 101–111)
Chloride: 98 mmol/L — ABNORMAL LOW (ref 101–111)
Creatinine, Ser: 2.21 mg/dL — ABNORMAL HIGH (ref 0.44–1.00)
Creatinine, Ser: 2.4 mg/dL — ABNORMAL HIGH (ref 0.44–1.00)
GFR calc Af Amer: 21 mL/min — ABNORMAL LOW (ref >60)
GFR calc Af Amer: 24 mL/min — ABNORMAL LOW (ref >60)
GFR calc non Af Amer: 20 mL/min — ABNORMAL LOW (ref >60)
GFR, EST NON AFRICAN AMERICAN: 18 mL/min — AB (ref >60)
Glucose, Bld: 72 mg/dL (ref 65–99)
Glucose, Bld: 84 mg/dL (ref 65–99)
POTASSIUM: 5.3 mmol/L — AB (ref 3.5–5.1)
Potassium: 5.2 mmol/L — ABNORMAL HIGH (ref 3.5–5.1)
SODIUM: 134 mmol/L — AB (ref 135–145)
Sodium: 134 mmol/L — ABNORMAL LOW (ref 135–145)

## 2016-12-08 LAB — CBC
HCT: 29.1 % — ABNORMAL LOW (ref 35.0–47.0)
Hemoglobin: 9.6 g/dL — ABNORMAL LOW (ref 12.0–16.0)
MCH: 29.2 pg (ref 26.0–34.0)
MCHC: 33 g/dL (ref 32.0–36.0)
MCV: 88.3 fL (ref 80.0–100.0)
Platelets: 178 10*3/uL (ref 150–440)
RBC: 3.29 MIL/uL — ABNORMAL LOW (ref 3.80–5.20)
RDW: 16.2 % — ABNORMAL HIGH (ref 11.5–14.5)
WBC: 4.4 10*3/uL (ref 3.6–11.0)

## 2016-12-08 LAB — PROTIME-INR
INR: 5.79
Prothrombin Time: 53.8 s — ABNORMAL HIGH (ref 11.4–15.2)

## 2016-12-08 MED ORDER — BACITRACIN ZINC 500 UNIT/GM EX OINT
TOPICAL_OINTMENT | Freq: Two times a day (BID) | CUTANEOUS | Status: DC
  Administered 2016-12-08 – 2016-12-16 (×17): via TOPICAL
  Filled 2016-12-08: qty 28.35

## 2016-12-08 MED ORDER — ORAL CARE MOUTH RINSE
15.0000 mL | Freq: Two times a day (BID) | OROMUCOSAL | Status: DC
  Administered 2016-12-10 – 2016-12-15 (×3): 15 mL via OROMUCOSAL

## 2016-12-08 MED ORDER — GLUCERNA SHAKE PO LIQD
237.0000 mL | Freq: Three times a day (TID) | ORAL | Status: DC
Start: 2016-12-08 — End: 2016-12-16
  Administered 2016-12-08 – 2016-12-16 (×7): 237 mL via ORAL

## 2016-12-08 NOTE — Consult Note (Signed)
[facility] Nurse wound consult note Reason for Consult: Abraised areas on face secondary to fall, also right elbow with full thickness (avulsed) skin tear (traumatic), right great toe with erythema and serum filled blister, now resolved. Patient is also incontinence of bowel and bladder Wound type:Trauma Pressure Injury POA: No Measurement: Right arm skin tear measures 3cm x 2.4cm x 0.1cm, avulsion is rolled back into place and a thin line of dermis is exposed. Serosanguinous exudate in a small amount. Right great toe with erythema, previously serum filled blister has reabsorbed and the loose epidermal tissue remains. Heel blanches.  IAD noted, peri care is being provided at the time of my assessment. Facial abrasions are present from fall at nose and chin, the largest of which measures <1cm round x 0.1cm.  A chronic area of eczema is present at the left face. Wound bed:As described above Drainage (amount, consistency, odor) As described above Periwound:intact, dry Dressing procedure/placement/frequency:I have provided a POC to include Vaseline gauze to the right elbow skin tear, moisturizing and pressure injury protection via a heel boot to the right foot and bacitracin ointment to the areas of facial dryness and abrasions. [place] nursing team will not follow, but will remain available to this patient, the nursing and medical teams.  Please re-consult if needed. Thanks, Maudie Flakes, MSN, RN, [place], Arther [facility]  Pager# [phone]

## 2016-12-08 NOTE — Consult Note (Signed)
Cardiology Consult    Patient ID: Elizabeth [name] MRN: [id number], DOB/AGE: [birth year]   Admit date: 12/07/2016 Date of Consult: 12/08/2016  Primary Physician: Donetta Potts, MD Reason for Consult: CHF Primary Cardiologist: Dr. Rockey Situ Requesting Provider: Dr. Darvin Neighbours   History of Present Illness    Elizabeth [name] is a 76 y.o. female with past medical history of CAD (s/p 1-vessel CABG to LCx in 2001), chronic diastolic CHF (EF 31-54% by echo in 11/2013), ESRD (s/p kidney transplant in 2001), PVD (s/p left BKA), prior CVA and PE (on Coumadin), orthostatic hypotension, recurrent pleural effusions, Type 2 DM and PAF who is being seen today for the evaluation of CHF at the request of Dr. Darvin Neighbours.   She was recently admitted to [facility] from 4/3 - 12/02/2016 for acute hypoxic respiratory failure in the setting of Influenza B and pulmonary edema. Required a right thoracentesis on 4/5 with over 1L of fluid removed (cultures negative). Weight at time of discharge was 117 lbs.   She presented to [facility] on 4/18 for progressive fatigue and weakness in the setting of nausea and vomiting. Has been unable to keep any solids or liquids down. She denies any recent chest pain or palpitations. No acute worsening in her dyspnea, but reports it is uncomfortable to take deep breaths.    Initial labs show WBC of 9.4, Hgb 11.2, platelets 199. Na+ 135, K+ 6.3, creatinine 2.59 (at 1.60 at time of recent hospital discharge). BNP 456. INR 5.51. Initial troponin flat at 0.03. CXR with moderate right and small left pleural effusions, with associated atelectasis. Mild vascular congestion and mild cardiomegaly noted as well. CT Abdomen showing moderate to large pleural effusion on the right, small on the left. EKG shows NSR, HR 65, with PAC's. TWI in V1 similar to prior tracings.    Torsemide was held on admission and she was started on IVF at 50 mL/hr in the setting of her AKI and likely dehydration.   Past  Medical History   Past Medical History:  Diagnosis Date  . Adenomatous polyp   . Anemia   . 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 Atrial fibrillation ([place])    a. on Coumadin. No rate-controlling medications with baseline bradycardia.   . Bradycardia   . CAD (coronary artery disease)    a. s/p 1-vessel CABG to LCx in 2001  . Carotid artery stenosis    mild  . Carotid bruit    left  . Chronic kidney disease    s/p cadaveric renal transplant in 2001  . Colitis   . Compression fracture of L4 lumbar vertebra (HCC)   . Coronary artery disease   . CVA (cerebral infarction)   . Diabetes mellitus   . Diabetic retinopathy   . Esophageal candidiasis ([place])   . Flu 11/22/2016  . Fracture of right maxilla 09/21/2012  . Fx ankle   . Fx wrist   . Gastritis   . GERD (gastroesophageal reflux disease)   . H/O immunosuppressive therapy   . History of orthostatic hypotension   . Hyperlipidemia   . Hyperparathyroidism   . Hypothyroidism   . IBS (irritable bowel syndrome)   . Ischemic colitis ([place])   . Lactose intolerance   . Leg ulcer ([place])    from poor fitting prosthetic  . Melanoma ([employer])   . Nephropathy   . 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  Neuropathy   . Osteoporosis    recurrent fractures  . Pancreatic cyst   . Pneumonia 11/2016  . Pulmonary embolism ([place])   . PVD (peripheral vascular disease) ([place])   . Renal artery stenosis ([place])   . 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 Renal osteodystrophy   . Right orbit fracture ([place]) 09/21/2012  . Stroke ([place])   . TBI (traumatic brain injury) ([place]) 09/30/2012  . Tubular adenoma   . Zygoma fracture ([place]) 09/21/2012    Past Surgical History:  Procedure Laterality Date  . ABDOMINAL HYSTERECTOMY    . APPENDECTOMY    . BELOW KNEE LEG AMPUTATION     left, charcott joint from neuropathy  . CATARACT EXTRACTION    . CESAREAN  SECTION     x 2  . CHOLECYSTECTOMY    . COLONOSCOPY  01/30/2012   Procedure: COLONOSCOPY;  Surgeon: Milus Banister, MD;  Location: [name];  Service: Endoscopy;  Laterality: N/A;  . COLONOSCOPY W/ BIOPSIES AND POLYPECTOMY  02/18/2009   adenomatous polyps, diverticulosis, internal hemorrhoids  . CORONARY ARTERY BYPASS GRAFT    . ESOPHAGOGASTRODUODENOSCOPY  05/02/2012   Procedure: ESOPHAGOGASTRODUODENOSCOPY (EGD);  Surgeon: Gatha Mayer, MD;  Location: [facility];  Service: Endoscopy;  Laterality: N/A;  . EUS  02/04/2010   w/FNA, pancreatic cyst  . IR THORACENTESIS ASP PLEURAL SPACE W/IMG GUIDE  11/24/2016  . KIDNEY TRANSPLANT  08/15/2000   [facility] in [place]  . THORACENTESIS  07/2016, 08/2016, 09/2016   Ordered by Dr. Vella Kohler. Surg: Dr.Green, Dr. Anselm Pancoast  . THYROID SURGERY    . TONSILLECTOMY    . UPPER GASTROINTESTINAL ENDOSCOPY  12/01/2009  . VITRECTOMY     right eye     Allergies  Allergies  Allergen Reactions  . Azithromycin Other (See Comments)    Upset stomach  . Erythromycin Nausea And Vomiting  . Fentanyl Nausea And Vomiting  . Percocet [Oxycodone-Acetaminophen] Nausea And Vomiting    Inpatient Medications    . atorvastatin  10 mg Oral QODAY  . escitalopram  10 mg Oral Daily  . ferrous sulfate  325 mg Oral Daily  . insulin aspart  0-9 Units Subcutaneous TID WC  . insulin glargine  7 Units Subcutaneous BH-q7a  . levothyroxine  112 mcg Oral QAC breakfast  . mouth rinse  15 mL Mouth Rinse BID  . mirtazapine  7.5 mg Oral QHS  . mycophenolate  360 mg Oral BID  . pantoprazole  40 mg Oral Daily  . predniSONE  5 mg Oral q morning - 10a  . sodium chloride flush  3 mL Intravenous Q12H  . tacrolimus  1 mg Oral QHS  . tacrolimus  2 mg Oral q morning - 10a  . Warfarin - Pharmacist Dosing Inpatient   Does not apply q1800    Family History    Family History  Problem Relation Age of Onset  . Stroke Mother 57  . Heart attack Mother   . Heart attack Father 25    . Breast cancer Sister   . Colon cancer Neg Hx   . Diabetes Neg Hx     Social History    Social History   Social History  . Marital status: Married    Spouse name: N/A  . Number of children: 2  . Years of education: N/A   Occupational History  . retired Marine scientist Retired   Social History Main Topics  . Smoking status: Never Smoker  . Smokeless  tobacco: Never Used  . Alcohol use No  . Drug use: No  . Sexual activity: Not on file   Other Topics Concern  . Not on file   Social History Narrative  . No narrative on file     Review of Systems    General:  No chills, fever, night sweats or weight changes.  Cardiovascular:  No chest pain, dyspnea on exertion, edema, orthopnea, palpitations, paroxysmal nocturnal dyspnea. Dermatological: No rash, lesions/masses Respiratory: No cough, dyspnea Urologic: No hematuria, dysuria Abdominal:   No bright red blood per rectum, melena, or hematemesis. Positive for nausea and vomiting.  Neurologic:  No visual changes, wkns, changes in mental status. All other systems reviewed and are otherwise negative except as noted above.  Physical Exam    Blood pressure (!) 143/42, pulse (!) 56, temperature 98.1 F (36.7 C), temperature source Oral, resp. rate 18, height 5\' 3"  (1.6 m), weight 125 lb 9.6 oz (57 kg), SpO2 94 %.  General: Pleasant, elderly Caucasian female appearing in NAD Psych: Normal affect. Neuro: Alert and oriented X 3. Moves all extremities spontaneously. HEENT: Normal  Neck: Supple without bruits or JVD. Lungs:  Resp regular and unlabored, decreased breath sounds along bases bilaterally. Heart: RRR no s3, s4, or murmurs. Abdomen: Soft, non-tender, non-distended, BS + x 4.  Extremities: No clubbing, cyanosis or lower extremity edema. Left BKA.   Labs    Troponin (Point of Care Test) No results for input(s): TROPIPOC in the last 72 hours.  Recent Labs  12/07/16 1852  TROPONINI 0.03*   Lab Results  Component Value  Date   WBC 4.4 12/08/2016   HGB 9.6 (L) 12/08/2016   HCT 29.1 (L) 12/08/2016   MCV 88.3 12/08/2016   PLT 178 12/08/2016    Recent Labs Lab 12/07/16 1852  12/08/16 0633  NA 135  < > 134*  K 6.3*  < > 5.2*  CL 98*  < > 100*  CO2 33*  < > 32  BUN 63*  < > 56*  CREATININE 2.59*  < > 2.21*  CALCIUM 9.1  < > 8.4*  PROT 5.7*  --   --   BILITOT 0.8  --   --   ALKPHOS 63  --   --   ALT 13*  --   --   AST 24  --   --   GLUCOSE 120*  < > 84  < > = values in this interval not displayed. Lab Results  Component Value Date   CHOL 115 08/14/2014   HDL 59 08/14/2014   LDLCALC 44 08/14/2014   TRIG 62 08/14/2014   No results found for: [facility]   Radiology Studies    Ct Abdomen Pelvis Wo Contrast  Result Date: 12/07/2016 CLINICAL DATA:  76 year old female with abdominal pain and vomiting for 3 days. EXAM: CT ABDOMEN AND PELVIS WITHOUT CONTRAST TECHNIQUE: Multidetector CT imaging of the abdomen and pelvis was performed following the standard protocol without IV contrast. COMPARISON:  01/06/2013 CT FINDINGS: Please note that parenchymal abnormalities may be missed without intravenous contrast. Lower chest: Bilateral pleural effusions are noted, moderate to large on the right and small on the left. Right lower lung atelectasis is present. Cardiomegaly and heavy coronary artery calcifications are noted. Hepatobiliary: The liver is unremarkable. The patient is status post cholecystectomy. There is no evidence of biliary dilatation. Pancreas: Unremarkable Spleen: Unremarkable Adrenals/Urinary Tract: Atrophic native kidneys are noted. A transplant kidney within the left pelvis is present with mild fullness  of the collecting system again noted. The adrenal glands are unremarkable. Stomach/Bowel: Colonic diverticulosis noted without definite diverticulitis. No definite areas of focal bowel wall thickening noted. There is no evidence of bowel obstruction. Vascular/Lymphatic: Heavy vascular calcifications  within the abdomen pelvis are noted. There is no evidence of abdominal aortic aneurysm. No enlarged lymph nodes are identified. Reproductive: Status post hysterectomy. No adnexal masses. Other: No free fluid, pneumoperitoneum or focal collection. Musculoskeletal: A severe compression fracture of L4 has increased since 11/13/2012 but appears chronic. No definite acute osseous abnormalities noted. IMPRESSION: Follow-up pleural effusions, moderate to large on the right and small on the left with associated moderate to severe right lower lung atelectasis. No evidence of acute abnormality within the abdomen or pelvis. Left pelvic transplant kidney and atrophic native kidneys. Electronically Signed   By: Margarette Canada M.D.   On: 12/07/2016 21:15   Dg Chest 1 View  Result Date: 11/24/2016 CLINICAL DATA:  Followup right thoracentesis. EXAM: CHEST 1 VIEW COMPARISON:  Earlier same day FINDINGS: Less pleural fluid present on the right. No pneumothorax. Moderate effusion does persist with volume loss of the right lower lung. Moderate effusion persists on the left with volume loss of the left lower lung. IMPRESSION: Less pleural fluid on the right following thoracentesis. No pneumothorax. Electronically Signed   By: Nelson Chimes M.D.   On: 11/24/2016 13:14   Dg Chest 2 View  Result Date: 11/26/2016 CLINICAL DATA:  76 year old female with a history of shortness of breath EXAM: CHEST  2 VIEW COMPARISON:  11/24/2016, 11/24/2016, 11/22/2016 FINDINGS: Cardiomediastinal silhouette unchanged. Surgical changes of prior median sternotomy and CABG. Similar appearance of bibasilar opacity obscuring the heart borders and hemidiaphragm, greater on the right. Lateral view demonstrates fluid in the costophrenic sulcus. No pneumothorax. Similar appearance of chronic interstitial opacities. Aortic atherosclerosis. Degenerative changes of the shoulders. Extensive calcifications of the left upper extremity and right upper extremity  vasculature. Calcifications of the abdominal vasculature. IMPRESSION: Unchanged bilateral, right greater than left pleural effusions with associated atelectasis/ consolidation. Aortic atherosclerosis, with changes of extensive atherosclerotic calcifications. Surgical changes of median sternotomy and CABG. Electronically Signed   By: Corrie Mckusick D.O.   On: 11/26/2016 12:49   Dg Chest 2 View  Result Date: 11/24/2016 CLINICAL DATA:  Right pleural effusion. Shortness of breath. Chest pain . EXAM: CHEST  2 VIEW COMPARISON:  11/22/2016. FINDINGS: Prior CABG. Cardiomegaly. Bilateral pulmonary infiltrates and pleural effusions consistent with CHF. Bilateral pneumonia cannot be excluded. Findings have progressed from prior exam. No pneumothorax. IMPRESSION: Prior CABG. Bilateral pulmonary infiltrates and bilateral pleural effusions consistent with CHF. Findings have progressed from prior exam . Electronically Signed   By: Marcello Moores  Register   On: 11/24/2016 09:19   Dg Chest 2 View  Result Date: 11/22/2016 CLINICAL DATA:  Cough and fever.  Renal transplant patient. EXAM: CHEST  2 VIEW COMPARISON:  09/23/2016 FINDINGS: The heart size and mediastinal contours are within normal limits. Aortic atherosclerosis. Prior CABG. Near complete resolution of left pleural effusion since previous study. Stable small subpulmonic right pleural effusion with associated compressive atelectasis in right lung base. No evidence of pulmonary edema or consolidation. IMPRESSION: No significant change in small subpulmonic right pleural effusion and right basilar atelectasis. Near complete resolution of left pleural effusion since prior study. Electronically Signed   By: Earle Gell M.D.   On: 11/22/2016 09:48   Dg Chest Portable 1 View  Result Date: 12/07/2016 CLINICAL DATA:  Acute onset of vomiting. Subacute onset of  loose stools. Initial encounter. EXAM: PORTABLE CHEST 1 VIEW COMPARISON:  Chest radiograph performed 11/29/2016 FINDINGS:  Moderate right and small left pleural effusions are seen, with associated atelectasis. Mild vascular congestion is noted. No pneumothorax is identified. The cardiomediastinal silhouette is mildly enlarged. Calcification is noted at the heart. The patient is status post median sternotomy. No acute osseous abnormalities are identified. IMPRESSION: Moderate right and small left pleural effusions, with associated atelectasis. Mild vascular congestion and mild cardiomegaly. Electronically Signed   By: Garald Balding M.D.   On: 12/07/2016 18:43   Dg Chest Port 1 View  Result Date: 11/29/2016 CLINICAL DATA:  Hypoxia and pulmonary edema. EXAM: PORTABLE CHEST 1 VIEW COMPARISON:  11/26/2016 FINDINGS: Mild-to-moderate cardiomegaly. Dense mitral annular calcification. Atherosclerotic calcification of the aortic arch. Prior CABG. The patient is rotated to the left on today's radiograph, reducing diagnostic sensitivity and specificity. Bony demineralization. Bibasilar airspace opacities primarily likely due to passive atelectasis from The moderate-sized bilateral pleural effusions, not appreciably changed from prior. IMPRESSION: 1. Stable radiographic appearance the chest with suspected bilateral pleural effusions and passive atelectasis obscuring the lung bases. 2. Cardiomegaly with mitral annular calcification and atherosclerotic calcification. Electronically Signed   By: Van Clines M.D.   On: 11/29/2016 12:23   Ir Thoracentesis Asp Pleural Space W/img Guide  Result Date: 11/24/2016 INDICATION: Patient with CHF and recurrent right pleural effusion. Request is made for diagnostic and therapeutic right thoracentesis. EXAM: ULTRASOUND GUIDED DIAGNOSTIC AND THERAPEUTIC RIGHT THORACENTESIS MEDICATIONS: 10 mL 1% lidocaine COMPLICATIONS: None immediate. PROCEDURE: An ultrasound guided thoracentesis was thoroughly discussed with the patient and questions answered. The benefits, risks, alternatives and complications were  also discussed. The patient understands and wishes to proceed with the procedure. Written consent was obtained. Ultrasound was performed to localize and mark an adequate pocket of fluid in the right chest. The area was then prepped and draped in the normal sterile fashion. 1% Lidocaine was used for local anesthesia. Under ultrasound guidance a Safe-T-centesis catheter was introduced. Thoracentesis was performed. The catheter was removed and a dressing applied. FINDINGS: A total of approximately 1.0 liters of clear, yellow fluid was removed. Samples were sent to the laboratory as requested by the clinical team. IMPRESSION: Successful ultrasound guided diagnostic and therapeutic right thoracentesis yielding 1.0 of pleural fluid. Procedure was stopped prior to removal of all fluid due to patient discomfort and nausea. Read by:  Brynda Greathouse PA-C Electronically Signed   By: Lucrezia Europe M.D.   On: 11/24/2016 12:56    EKG & Cardiac Imaging    EKG:  NSR, HR 65, with PAC's. TWI in V1 similar to prior tracings.  - Personally Reviewed  Echocardiogram: 11/2013 Study Conclusions  - Left ventricle: The cavity size was normal. There was mild concentric hypertrophy. Systolic function was vigorous. The estimated ejection fraction was in the range of 65% to 70%. Wall motion was normal; there were no regional wall motion abnormalities. Features are consistent with a pseudonormal left ventricular filling pattern, with concomitant abnormal relaxation and increased filling pressure (grade 2 diastolic dysfunction). Doppler parameters are consistent with elevated ventricular end-diastolic filling pressure. - Aortic valve: Mild regurgitation. - Mitral valve: Severely thickened and calcified mitral annulus involving leaflets with restricted leaflet opening. Mild mitral stenosis. Moderate regurgitation directed posteriorly. - Left atrium: The atrium was moderately dilated. - Right ventricle:  Systolic function was moderately reduced. - Right atrium: The atrium was mildly dilated. - Tricuspid valve: Moderate regurgitation. - Pericardium, extracardiac: A trivial pericardial effusion was identified. There  was a left pleural effusion. Transthoracic echocardiography. M-mode, complete 2D, spectral Doppler, and color Doppler. Height: Height: 160cm. Height: 63in. Weight: Weight: 58.2kg. Weight: 128lb. Body mass index: BMI: 22.7kg/m^2. Body surface area:  BSA: 1.14m^2. Blood pressure:   149/116. Patient status: Inpatient. Location: Echo laboratory.  Assessment & Plan    1. Chronic Diastolic CHF - echo from 9150 showed a preserved EF of 65-70% with Grade 2 DD and no WMA.  - admitted with nausea and vomiting, found to have an AKI in the setting of dehydration.  - she does not appear volume overloaded on physical examination yet BNP was mildly elevated (at 456) which is likely influenced by her AKI as well. Agree with holding PTA Torsemide and continuing low-dose IVF.  - weight at time of hospital discharge was 117 lbs, yet she was at 132 lbs in 10/2016. Weight at 125 lbs today. Monitor fluid status closely while receiving IVF.   2. AKI - creatinine elevated to 2.59 on admission, 1.60 at time of recent hospital discharge.  - she is s/p kidney transplant in 2001. Nephrology consult has been requested by the admitting team.   3. Recurrent Pleural Effusions - has required multiple thoracentesis in the past. Most recent was a right thoracentesis performed on 4/5 with over 1L of fluid removed (cultures negative).  - CT this admission with moderate to large pleural effusion on the right, small on the left.  - continue to follow. May require repeat procedure this admission but need to wait until INR is within a safe range.   4. CAD - s/p 1-vessel CABG to LCx in 2001.  - she denies any recent anginal symptoms.  - continue statin therapy. No ASA secondary to need for  Coumadin. No BB with baseline bradycardia.   5. Paroxysmal Atrial Fibrillation - This patients CHA2DS2-VASc Score and unadjusted Ischemic Stroke Rate (% per year) is equal to 10.8 % stroke rate/year from a score of 8 (HTN, DM, Vascular, Female, Age (2), CVA(2)). On Coumadin PTA. Currently held as INR is supratherapeutic at 5.79. - maintaining NSR. Not on rate-controlling medications secondary to baseline bradycardia.   6. Nausea and Vomiting - CT Abdomen without acute findings. Likely secondary to viral gastroenteritis.  - per admitting team  7. Type 2 DM - per admitting team.   Signed, Erma Heritage, PA-C 12/08/2016, 8:11 AM Pager: [phone]

## 2016-12-08 NOTE — Progress Notes (Signed)
Initial Nutrition Assessment  DOCUMENTATION CODES:   Severe malnutrition in context of acute illness/injury  INTERVENTION:  1. Glucerna Shake po TID, each supplement provides 220 kcal and 10 grams of protein 2. Magic cup TID with meals, each supplement provides 290 kcal and 9 grams of protein  NUTRITION DIAGNOSIS:   Malnutrition (Severe) related to acute illness (viral gastroenteritis) as evidenced by moderate depletion of body fat, severe depletion of muscle mass, moderate depletions of muscle mass.  GOAL:   Patient will meet greater than or equal to 90% of their needs  MONITOR:   PO intake, Supplement acceptance, I & O's, Labs, Weight trends  REASON FOR ASSESSMENT:   Malnutrition Screening Tool    ASSESSMENT:   Elizabeth [name]  is a 76 y.o. female with a known history of Hypertension, diabetes, chronic diastolic CHF, kidney transplant on immunosuppressants, CKD stage III, history of PE presents to the emergency room from home due to weakness, vomiting and diarrhea  Spoke mostly with patient's daughter at bedside. Patient responded some. PO has been poor x2-3 weeks now. Consumed 5-6 bites and some sips this morning per daughter. This has been patient's PO intake for the past 2-3 weeks. Some nausea today - no vomiting.  Per daughter, PTA patient would vomit undigested food - has not been observed inpatient. Denies any issues chewing/swallowing/choking Wt has fluctuated due to to recent hospitalization and illness.  Nutrition-Focused physical exam completed. Findings are moderate fat depletion, moderate-severe muscle depletion, and moderate edema.   Labs and medications reviewed: Na 134, K 5.2 Prednisone, remeron, Iron NS @ 62mL/hr  Diet Order:  Diet Carb Modified Fluid consistency: Thin; Room service appropriate? Yes  Skin:  Reviewed, no issues  Last BM:  12/08/2016  Height:   Ht Readings from Last 1 Encounters:  12/07/16 5\' 3"  (1.6 m)    Weight:   Wt  Readings from Last 1 Encounters:  12/08/16 125 lb 9.6 oz (57 kg)    Ideal Body Weight:  48.3 kg  BMI:  Body mass index is 22.25 kg/m.  Estimated Nutritional Needs:   Kcal:  1600-1900 calories  Protein:  70-80 gm  Fluid:  >/= 1.6L  EDUCATION NEEDS:   No education needs identified at this time  Satira Anis. Chavis Tessler, MS, RD LDN Inpatient Clinical Dietitian Pager [phone]

## 2016-12-08 NOTE — Consult Note (Signed)
Date: 12/08/2016                  Patient Name:  Elizabeth [name]  MRN: [id number]  DOB: Jun 14, 1941  Age / Sex: 76 y.o., female         PCP: Donetta Potts, MD                 Service Requesting Consult: ARF                 Reason for Consult: Acute renal failure, kidney transplant patient            History of Present Illness: Patient is a 76 y.o. female with medical problems of Coronary disease with CABG in 2001, end-stage renal disease, was on dialysis for 5 years and got deceased donor kidney transplant  in 2001, stroke x3 with last in 2001, type 2 diabetes, Atrial fibrillation, peripheral vascular disease with left BKA, right upper extremity DVT requiring warfarin, who was admitted to [facility] on 12/07/2016 for evaluation of weakness, vomiting and diarrhea. Patient was hospitalized 2 weeks ago for pneumonia.  She was positive for influenza B at that time. Baseline creatinine appears to be 1.45 from April 12 The patient creatinine is 2.40 which has improved slightly down to 2.21 today  Medications: Outpatient medications: Prescriptions Prior to Admission  Medication Sig Dispense Refill Last Dose  . acetaminophen (TYLENOL) 500 MG tablet Take 1,000 mg by mouth every 6 (six) hours as needed for moderate pain or headache.    prn at prn  . atorvastatin (LIPITOR) 10 MG tablet Take 1 tablet (10 mg total) by mouth every other day. 30 tablet 1 12/06/2016 at pm  . cholecalciferol (VITAMIN D) 1000 UNITS tablet Take 2,000 Units by mouth daily.    12/07/2016 at 1200  . docusate sodium (COLACE) 100 MG capsule Take 100 mg by mouth daily as needed for moderate constipation.    prn at prn  . escitalopram (LEXAPRO) 10 MG tablet Take 10 mg by mouth daily.   12/07/2016 at am  . ferrous sulfate (SM IRON) 325 (65 FE) MG tablet Take 325 mg by mouth daily.    12/06/2016 at late afternoon  . HYDROcodone-acetaminophen (NORCO/VICODIN) 5-325 MG per tablet Take 2 tablets by mouth 2 (two) times daily as needed  for moderate pain.    prn at prn  . hyoscyamine (ANASPAZ) 0.125 MG TBDP disintergrating tablet Place 0.125 mg under the tongue as needed for cramping.    prn at prn  . insulin glargine (LANTUS) 100 UNIT/ML injection Inject 7 Units into the skin every morning.    12/07/2016 at am  . insulin lispro (HUMALOG) 100 UNIT/ML injection Inject 2-9 Units into the skin 4 (four) times daily -  before meals and at bedtime. Per sliding scale.   prn at prn  . levothyroxine (SYNTHROID, LEVOTHROID) 112 MCG tablet Take 112 mcg by mouth daily before breakfast.   12/07/2016 at am  . magnesium gluconate (MAGONATE) 500 MG tablet Take 1,000 mg by mouth daily.    12/06/2016 at late afternoon  . mirtazapine (REMERON) 7.5 MG tablet Take 7.5 mg by mouth at bedtime.    12/06/2016 at qhs   . Multiple Vitamin (MULTIVITAMIN WITH MINERALS) TABS Take 1 tablet by mouth daily.   12/07/2016 at 1200  . mycophenolate (MYFORTIC) 360 MG TBEC Take 360 mg by mouth 2 (two) times daily.    12/07/2016 at am  . nitroGLYCERIN (NITROSTAT) 0.4 MG SL tablet Place  1 tablet (0.4 mg total) under the tongue every 5 (five) minutes as needed for chest pain. 25 tablet 3 prn at prn  . omeprazole (PRILOSEC) 20 MG capsule Take 20 mg by mouth at bedtime.   12/06/2016 at qhs   . ondansetron (ZOFRAN) 4 MG tablet Take 4 mg by mouth every 8 (eight) hours as needed for nausea or vomiting.    12/07/2016 at 1000  . potassium chloride (K-DUR) 10 MEQ tablet Take 10 mEq by mouth daily at 12 noon.    12/07/2016 at 1200  . predniSONE (DELTASONE) 5 MG tablet Take 5 mg by mouth every morning.    12/07/2016 at am  . promethazine (PHENERGAN) 25 MG tablet Take 1 tablet (25 mg total) by mouth every 6 (six) hours as needed for nausea or vomiting. 30 tablet 1 prn at prn  . tacrolimus (PROGRAF) 0.5 MG capsule Take 1-2 mg by mouth See admin instructions. Take 2mg  in the am & 1mg  at night    12/07/2016 at am  . torsemide (DEMADEX) 20 MG tablet TAKE ONE TABLET BY MOUTH TWICE DAILY AS NEEDED  (Patient taking differently: Take 10 mg by mouth at bedtime alternating with 20 mg by mouth at bedtime.) 180 tablet 2 Past Week at Unknown time  . warfarin (COUMADIN) 2.5 MG tablet TAKE AS DIRECTED BY COUMADIN CLINIC (Patient taking differently: Take 5 mg by mouth daily on Sunday, Tuesday and Thursday. Take 2.5 mg by mouth daily on all other days) 150 tablet 1 12/06/2016 at pm  . midodrine (PROAMATINE) 10 MG tablet Take 1 tablet (10 mg total) by mouth 3 (three) times daily as needed. (Patient not taking: Reported on 12/07/2016) 90 tablet 6 Not Taking at prn    Current medications: Current Facility-Administered Medications  Medication Dose Route Frequency Provider Last Rate Last Dose  . 0.9 %  sodium chloride infusion   Intravenous Continuous Hillary Bow, MD 50 mL/hr at 12/08/16 1417    . acetaminophen (TYLENOL) tablet 650 mg  650 mg Oral Q6H PRN Hillary Bow, MD       Or  . acetaminophen (TYLENOL) suppository 650 mg  650 mg Rectal Q6H PRN Srikar Sudini, MD      . albuterol (PROVENTIL) (2.5 MG/3ML) 0.083% nebulizer solution 2.5 mg  2.5 mg Nebulization Q2H PRN Srikar Sudini, MD      . atorvastatin (LIPITOR) tablet 10 mg  10 mg Oral QODAY Srikar Sudini, MD   10 mg at 12/08/16 0909  . bacitracin ointment   Topical BID Bettey Costa, MD      . escitalopram (LEXAPRO) tablet 10 mg  10 mg Oral Daily Hillary Bow, MD   10 mg at 12/08/16 0909  . feeding supplement (GLUCERNA SHAKE) (GLUCERNA SHAKE) liquid 237 mL  237 mL Oral TID BM Sital Mody, MD   237 mL at 12/08/16 1553  . ferrous sulfate tablet 325 mg  325 mg Oral Daily Hillary Bow, MD   325 mg at 12/08/16 0909  . HYDROcodone-acetaminophen (NORCO/VICODIN) 5-325 MG per tablet 2 tablet  2 tablet Oral BID PRN Hillary Bow, MD      . hyoscyamine (ANASPAZ) disintergrating tablet 0.125 mg  0.125 mg Sublingual PRN Srikar Sudini, MD      . insulin aspart (novoLOG) injection 0-9 Units  0-9 Units Subcutaneous TID WC Srikar Sudini, MD      . insulin glargine  (LANTUS) injection 7 Units  7 Units Subcutaneous Julianne Rice, MD   7 Units at 12/08/16 [phone]  .  levothyroxine (SYNTHROID, LEVOTHROID) tablet 112 mcg  112 mcg Oral QAC breakfast Hillary Bow, MD   112 mcg at 12/08/16 0910  . MEDLINE mouth rinse  15 mL Mouth Rinse BID Bettey Costa, MD      . mirtazapine (REMERON) tablet 7.5 mg  7.5 mg Oral QHS Srikar Sudini, MD   7.5 mg at 12/08/16 0043  . mycophenolate (MYFORTIC) EC tablet 360 mg  360 mg Oral BID Hillary Bow, MD   360 mg at 12/08/16 0908  . ondansetron (ZOFRAN) tablet 4 mg  4 mg Oral Q6H PRN Hillary Bow, MD       Or  . ondansetron (ZOFRAN) injection 4 mg  4 mg Intravenous Q6H PRN Hillary Bow, MD   4 mg at 12/08/16 0917  . pantoprazole (PROTONIX) EC tablet 40 mg  40 mg Oral Daily Hillary Bow, MD   40 mg at 12/08/16 0909  . polyethylene glycol (MIRALAX / GLYCOLAX) packet 17 g  17 g Oral Daily PRN Srikar Sudini, MD      . predniSONE (DELTASONE) tablet 5 mg  5 mg Oral q morning - 10a Hillary Bow, MD   5 mg at 12/08/16 0909  . sodium chloride flush (NS) 0.9 % injection 3 mL  3 mL Intravenous Q12H Hillary Bow, MD   3 mL at 12/08/16 0911  . tacrolimus (PROGRAF) capsule 1 mg  1 mg Oral QHS Hillary Bow, MD   1 mg at 12/08/16 0044  . tacrolimus (PROGRAF) capsule 2 mg  2 mg Oral q morning - 10a Hillary Bow, MD   2 mg at 12/08/16 0910  . Warfarin - Pharmacist Dosing Inpatient   Does not apply q1800 Hillary Bow, MD          Allergies: Allergies  Allergen Reactions  . Azithromycin Other (See Comments)    Upset stomach  . Erythromycin Nausea And Vomiting  . Fentanyl Nausea And Vomiting  . Percocet [Oxycodone-Acetaminophen] Nausea And Vomiting      Past Medical History: Past Medical History:  Diagnosis Date  . Adenomatous polyp   . Anemia   . 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 Atrial fibrillation ([place])    a. on Coumadin. No  rate-controlling medications with baseline bradycardia.   . Bradycardia   . CAD (coronary artery disease)    a. s/p 1-vessel CABG to LCx in 2001  . Carotid artery stenosis    mild  . Carotid bruit    left  . Chronic kidney disease    s/p cadaveric renal transplant in 2001  . Colitis   . Compression fracture of L4 lumbar vertebra ([facility])   . Coronary artery disease   . CVA (cerebral infarction)   . Diabetes mellitus   . Diabetic retinopathy   . Esophageal candidiasis ([place])   . Flu 11/22/2016  . Fracture of right maxilla 09/21/2012  . Fx ankle   . Fx wrist   . Gastritis   . GERD (gastroesophageal reflux disease)   . H/O immunosuppressive therapy   . History of orthostatic hypotension   . Hyperlipidemia   . Hyperparathyroidism   . Hypothyroidism   . IBS (irritable bowel syndrome)   . Ischemic colitis ([place])   . Lactose intolerance   . Leg ulcer ([place])    from poor fitting prosthetic  . Melanoma ([place])   . Nephropathy   . 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 Neuropathy   .  Osteoporosis    recurrent fractures  . Pancreatic cyst   . Pneumonia 11/2016  . Pulmonary embolism ([place])   . PVD (peripheral vascular disease) ([place])   . Renal artery stenosis ([place])   . 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 Renal osteodystrophy   . Right orbit fracture ([place]) 09/21/2012  . Stroke ([place])   . TBI (traumatic brain injury) ([place]) 09/30/2012  . Tubular adenoma   . Zygoma fracture ([place]) 09/21/2012     Past Surgical History: Past Surgical History:  Procedure Laterality Date  . ABDOMINAL HYSTERECTOMY    . APPENDECTOMY    . BELOW KNEE LEG AMPUTATION     left, charcott joint from neuropathy  . CATARACT EXTRACTION    . CESAREAN SECTION     x 2  . CHOLECYSTECTOMY    . COLONOSCOPY  01/30/2012   Procedure: COLONOSCOPY;  Surgeon: Milus Banister, MD;  Location: [facility];  Service: Endoscopy;  Laterality: N/A;  . COLONOSCOPY W/ BIOPSIES AND POLYPECTOMY  02/18/2009   adenomatous  polyps, diverticulosis, internal hemorrhoids  . CORONARY ARTERY BYPASS GRAFT    . ESOPHAGOGASTRODUODENOSCOPY  05/02/2012   Procedure: ESOPHAGOGASTRODUODENOSCOPY (EGD);  Surgeon: Gatha Mayer, MD;  Location: [facility];  Service: Endoscopy;  Laterality: N/A;  . EUS  02/04/2010   w/FNA, pancreatic cyst  . IR THORACENTESIS ASP PLEURAL SPACE W/IMG GUIDE  11/24/2016  . KIDNEY TRANSPLANT  08/15/2000   [facility] in [place]  . THORACENTESIS  07/2016, 08/2016, 09/2016   Ordered by Dr. Vella Kohler. Surg: Dr.Green, Dr. Anselm Pancoast  . THYROID SURGERY    . TONSILLECTOMY    . UPPER GASTROINTESTINAL ENDOSCOPY  12/01/2009  . VITRECTOMY     right eye     Family History: Family History  Problem Relation Age of Onset  . Stroke Mother 50  . Heart attack Mother   . Heart attack Father 30  . Breast cancer Sister   . Colon cancer Neg Hx   . Diabetes Neg Hx      Social History: Social History   Social History  . Marital status: Married    Spouse name: N/A  . Number of children: 2  . Years of education: N/A   Occupational History  . retired Marine scientist Retired   Social History Main Topics  . Smoking status: Never Smoker  . Smokeless tobacco: Never Used  . Alcohol use No  . Drug use: No  . Sexual activity: Not on file   Other Topics Concern  . Not on file   Social History Narrative  . No narrative on file     Review of Systems:Limited Gen: patient reports fevers and chills especially when she had pneumonia HEENT: denies vision and hearing problems CV: no chest pain.  Some shortness of breath with exertion Resp: does report continued cough and sputum production since her pneumonia XB:[id number] is poor GU : no problems reported MS: left BKA. Derm:  no complaints Psych:no complaints Heme: no complaints Neuro: .  No complaints Endocrine.  No complaints  Vital Signs: Blood pressure (!) 109/35, pulse (!) 47, temperature 97.8 F (36.6 C), resp. rate 10, height 5\' 3"  (1.6 m), weight 57 kg  (125 lb 9.6 oz), SpO2 100 %.   Intake/Output Summary (Last 24 hours) at 12/08/16 1610 Last data filed at 12/08/16 0420  Gross per 24 hour  Intake           840.83 ml  Output  0 ml  Net           840.83 ml    Weight trends: Filed Weights   12/07/16 1750 12/07/16 2333 12/08/16 0418  Weight: 53.1 kg (117 lb) 56.6 kg (124 lb 11.2 oz) 57 kg (125 lb 9.6 oz)    Physical Exam: General:  frail appearing, laying in the bed  HEENT Anicteric, moist oral mucous membranes  Neck:  supple  Lungs: Normal breathing effort, clear to auscultation  Heart::  tachycardic, irregular, no rub or gallop  Abdomen: Soft, nontender, nondistended  Extremities:  no peripheral edema, left BKA  Neurologic: Alert, oriented, flat affect  Skin: Scattered echymosis             Lab results: Basic Metabolic Panel:  Recent Labs Lab 12/07/16 1852 12/08/16 0019 12/08/16 0633  NA 135 134* 134*  K 6.3* 5.3* 5.2*  CL 98* 98* 100*  CO2 33* 32 32  GLUCOSE 120* 72 84  BUN 63* 60* 56*  CREATININE 2.59* 2.40* 2.21*  CALCIUM 9.1 8.6* 8.4*    Liver Function Tests:  Recent Labs Lab 12/07/16 1852  AST 24  ALT 13*  ALKPHOS 63  BILITOT 0.8  PROT 5.7*  ALBUMIN 3.1*    Recent Labs Lab 12/07/16 1852  LIPASE 17   No results for input(s): AMMONIA in the last 168 hours.  CBC:  Recent Labs Lab 12/07/16 1852 12/08/16 0633  WBC 9.4 4.4  NEUTROABS 8.8*  --   HGB 11.2* 9.6*  HCT 34.0* 29.1*  MCV 93.5 88.3  PLT 199 178    Cardiac Enzymes:  Recent Labs Lab 12/07/16 1852  TROPONINI 0.03*    BNP: Invalid input(s): POCBNP  CBG:  Recent Labs Lab 12/08/16 0038 12/08/16 0423 12/08/16 0803 12/08/16 1154 12/08/16 1348  GLUCAP 67 112* 74 59* 96    Microbiology: Recent Results (from the past 720 hour(s))  Susceptibility, Aer + Anaerob     Status: None   Collection Time: 11/22/16 12:00 AM  Result Value Ref Range Status   Suscept, Aer + Anaerob Final report  Corrected     Comment: (NOTE) Performed At: [facility] [address] [place], [place] [id number] Lindon Romp MD IW:[id number] CORRECTED ON 04/13 AT 1740: PREVIOUSLY REPORTED AS Preliminary report    Source of Sample URINE/ ENTEROCOCCUS FAECIUM SUSCEPTIBILITY  Final  Susceptibility Result     Status: None   Collection Time: 11/22/16 12:00 AM  Result Value Ref Range Status   Suscept Result 1 Enterococcus faecium  Final    Comment: (NOTE) Identification performed by account, not confirmed by this laboratory. Ciprofloxacin     >2      Resistant Nitrofurantoin    64      Intermediate Levofloxacin      >4      Resistant Tetracycline      >8      Resistant Rifampin         <=1      Susceptible    Antimicrobial Suscept Comment  Final    Comment: (NOTE)      ** S = Susceptible; I = Intermediate; R = Resistant **                   P = Positive; N = Negative            MICS are expressed in micrograms per mL   Antibiotic  RSLT#1    RSLT#2    RSLT#3    RSLT#4 Penicillin                     R Vancomycin                     R Performed At: [facility] [place], [place] [id number] Lindon Romp MD GM:[id number]   Blood culture (routine x 2)     Status: None   Collection Time: 11/22/16 12:52 PM  Result Value Ref Range Status   Specimen Description BLOOD RIGHT ANTECUBITAL  Final   Special Requests   Final    BOTTLES DRAWN AEROBIC AND ANAEROBIC Blood Culture adequate volume   Culture NO GROWTH 5 DAYS  Final   Report Status 11/27/2016 FINAL  Final  Blood culture (routine x 2)     Status: None   Collection Time: 11/22/16 12:54 PM  Result Value Ref Range Status   Specimen Description BLOOD RIGHT FOREARM  Final   Special Requests   Final    BOTTLES DRAWN AEROBIC ONLY Blood Culture adequate volume   Culture NO GROWTH 5 DAYS  Final   Report Status 11/27/2016 FINAL  Final  Urine culture     Status: Abnormal   Collection Time: 11/22/16  3:44 PM   Result Value Ref Range Status   Specimen Description URINE, CATHETERIZED  Final   Special Requests NONE  Final   Culture (A)  Final    80,000 COLONIES/mL ENTEROCOCCUS FAECIUM SEE SEPARATE REPORT FOR SUSCEPTIBILITY RESULTS Performed at [facility], READ BACK BY AND VERIFIED WITH: R SAWYER,RN AT 1221 11/26/16 BY L BENFIELD CONCERNING DELAY IN RESULTS    Report Status 12/05/2016 FINAL  Final  Gram stain     Status: None   Collection Time: 11/24/16  1:06 PM  Result Value Ref Range Status   Specimen Description Pleural R  Final   Special Requests NONE  Final   Gram Stain   Final    CYTOSPIN SMEAR WBC PRESENT,BOTH PMN AND MONONUCLEAR NO ORGANISMS SEEN    Report Status 11/24/2016 FINAL  Final  Culture, body fluid-bottle     Status: None   Collection Time: 11/24/16  1:06 PM  Result Value Ref Range Status   Specimen Description PLEURAL  Final   Special Requests NONE  Final   Culture NO GROWTH 5 DAYS  Final   Report Status 11/29/2016 FINAL  Final  MRSA PCR Screening     Status: None   Collection Time: 11/24/16  6:04 PM  Result Value Ref Range Status   MRSA by PCR NEGATIVE NEGATIVE Final    Comment:        The GeneXpert MRSA Assay (FDA approved for NASAL specimens only), is one component of a comprehensive MRSA colonization surveillance program. It is not intended to diagnose MRSA infection nor to guide or monitor treatment for MRSA infections.   Culture, blood (routine x 2)     Status: None (Preliminary result)   Collection Time: 12/07/16  6:40 PM  Result Value Ref Range Status   Specimen Description BLOOD LEFT HAND  Final   Special Requests   Final    BOTTLES DRAWN AEROBIC AND ANAEROBIC Blood Culture results may not be optimal due to an excessive volume of blood received in culture bottles   Culture NO GROWTH < 12 HOURS  Final   Report Status PENDING  Incomplete  Culture, blood (routine x  2)     Status: None (Preliminary result)   Collection Time:  12/07/16  6:40 PM  Result Value Ref Range Status   Specimen Description BLOOD  R AC  Final   Special Requests BOTTLES DRAWN AEROBIC AND ANAEROBIC BCAV  Final   Culture NO GROWTH < 12 HOURS  Final   Report Status PENDING  Incomplete     Coagulation Studies:  Recent Labs  12/07/16 1852 12/08/16 0633  LABPROT 51.7* 53.8*  INR 5.51* 5.79*    Urinalysis: No results for input(s): COLORURINE, LABSPEC, PHURINE, GLUCOSEU, HGBUR, BILIRUBINUR, KETONESUR, PROTEINUR, UROBILINOGEN, NITRITE, LEUKOCYTESUR in the last 72 hours.  Invalid input(s): APPERANCEUR      Imaging: Ct Abdomen Pelvis Wo Contrast  Result Date: 12/07/2016 CLINICAL DATA:  76 year old female with abdominal pain and vomiting for 3 days. EXAM: CT ABDOMEN AND PELVIS WITHOUT CONTRAST TECHNIQUE: Multidetector CT imaging of the abdomen and pelvis was performed following the standard protocol without IV contrast. COMPARISON:  01/06/2013 CT FINDINGS: Please note that parenchymal abnormalities may be missed without intravenous contrast. Lower chest: Bilateral pleural effusions are noted, moderate to large on the right and small on the left. Right lower lung atelectasis is present. Cardiomegaly and heavy coronary artery calcifications are noted. Hepatobiliary: The liver is unremarkable. The patient is status post cholecystectomy. There is no evidence of biliary dilatation. Pancreas: Unremarkable Spleen: Unremarkable Adrenals/Urinary Tract: Atrophic native kidneys are noted. A transplant kidney within the left pelvis is present with mild fullness of the collecting system again noted. The adrenal glands are unremarkable. Stomach/Bowel: Colonic diverticulosis noted without definite diverticulitis. No definite areas of focal bowel wall thickening noted. There is no evidence of bowel obstruction. Vascular/Lymphatic: Heavy vascular calcifications within the abdomen pelvis are noted. There is no evidence of abdominal aortic aneurysm. No enlarged lymph  nodes are identified. Reproductive: Status post hysterectomy. No adnexal masses. Other: No free fluid, pneumoperitoneum or focal collection. Musculoskeletal: A severe compression fracture of L4 has increased since 11/13/2012 but appears chronic. No definite acute osseous abnormalities noted. IMPRESSION: Follow-up pleural effusions, moderate to large on the right and small on the left with associated moderate to severe right lower lung atelectasis. No evidence of acute abnormality within the abdomen or pelvis. Left pelvic transplant kidney and atrophic native kidneys. Electronically Signed   By: Margarette Canada M.D.   On: 12/07/2016 21:15   Dg Chest Portable 1 View  Result Date: 12/07/2016 CLINICAL DATA:  Acute onset of vomiting. Subacute onset of loose stools. Initial encounter. EXAM: PORTABLE CHEST 1 VIEW COMPARISON:  Chest radiograph performed 11/29/2016 FINDINGS: Moderate right and small left pleural effusions are seen, with associated atelectasis. Mild vascular congestion is noted. No pneumothorax is identified. The cardiomediastinal silhouette is mildly enlarged. Calcification is noted at the heart. The patient is status post median sternotomy. No acute osseous abnormalities are identified. IMPRESSION: Moderate right and small left pleural effusions, with associated atelectasis. Mild vascular congestion and mild cardiomegaly. Electronically Signed   By: Garald Balding M.D.   On: 12/07/2016 18:43      Assessment & Plan: Pt is a 76 y.o. caucasian female with medical problems of Coronary disease with CABG in 2001, end-stage renal disease, was on dialysis for 5 years and got deceased donor kidney transplant  in 2001, stroke x3 with last in 2001, type 2 diabetes, Atrial fibrillation, peripheral vascular disease with left BKA, right upper extremity DVT requiring warfarin, who was admitted to [facility] on 12/07/2016 for evaluation of weakness, vomiting and diarrhea. Patient  was hospitalized 2 weeks ago for pneumonia.   She was positive for influenza B at that time.  1.  Acute kidney injury  2.  Chronic kidney disease States today.  Baseline creatinine 1.45 from April 12 3.  Renal transplant status. Deceased donor 1999/12/25. Hyperkalemia  Plan: Acute kidney injury appears to be secondary to concurrent infections dating to ATN.  Serum creatinine has started to improve today.  We will obtain tacrolimus level tomorrow morning. Continue with immunosuppression medications at home doses Low K Diet - avoid orange juice. Use Apple juice We will follow

## 2016-12-08 NOTE — [place] Notes (Signed)
MD notified via text, pts PT/INR elevated at 53.8/5.7, RN notified.

## 2016-12-08 NOTE — Progress Notes (Signed)
[facility] NAME: Elizabeth [name]    MR#:  [id number]  DATE OF BIRTH:  01-19-1941  SUBJECTIVE:   She is feeling tired this morning. No nausea vomiting or diarrhea this morning  REVIEW OF SYSTEMS:    Review of Systems  Constitutional: Negative for fever, chills +++weight loss ++Tired and weak HENT: Negative for ear pain, nosebleeds, congestion, facial swelling, rhinorrhea, neck pain, neck stiffness and ear discharge.   Respiratory: Negative for cough, shortness of breath, wheezing  Cardiovascular: Negative for chest pain, palpitations and leg swelling.  Gastrointestinal: Negative for heartburn, abdominal pain, vomiting, diarrhea or consitpation Genitourinary: Negative for dysuria, urgency, frequency, hematuria Musculoskeletal: Negative for back pain or joint pain Neurological: Negative for dizziness, seizures, syncope, focal weakness,  numbness and headaches.  Hematological: Does not bruise/bleed easily.  Psychiatric/Behavioral: Negative for hallucinations, confusion, dysphoric mood    Tolerating Diet:yes      DRUG ALLERGIES:   Allergies  Allergen Reactions  . Azithromycin Other (See Comments)    Upset stomach  . Erythromycin Nausea And Vomiting  . Fentanyl Nausea And Vomiting  . Percocet [Oxycodone-Acetaminophen] Nausea And Vomiting    VITALS:  Blood pressure (!) 143/42, pulse (!) 56, temperature 98.1 F (36.7 C), temperature source Oral, resp. rate 18, height 5\' 3"  (1.6 m), weight 57 kg (125 lb 9.6 oz), SpO2 94 %.  PHYSICAL EXAMINATION:  Constitutional: Appears well-developed and well-nourished. No distress. HENT: Normocephalic. Marland Kitchen Oropharynx is clear and moist.  Eyes: Conjunctivae and EOM are normal. PERRLA, no scleral icterus. Mucus membranes dry Neck: Normal ROM. Neck supple. No JVD. No tracheal deviation. CVS: RRR, S1/S2 +, no murmurs, no gallops, no carotid bruit.  Pulmonary: Effort and breath sounds  normal, no stridor, rhonchi, wheezes, rales.  Abdominal: Soft. BS +,  no distension, tenderness, rebound or guarding.  Musculoskeletal: Normal range of motion. No edema and no tenderness. Left BKA Neuro: Alert. CN 2-12 grossly intact. No focal deficits. Skin: Skin is warm and dry. No rash noted. Left cheek she has removed skin cacner Psychiatric: Normal mood and affect.      LABORATORY PANEL:   CBC  Recent Labs Lab 12/08/16 0633  WBC 4.4  HGB 9.6*  HCT 29.1*  PLT 178   ------------------------------------------------------------------------------------------------------------------  Chemistries   Recent Labs Lab 12/07/16 1852  12/08/16 0633  NA 135  < > 134*  K 6.3*  < > 5.2*  CL 98*  < > 100*  CO2 33*  < > 32  GLUCOSE 120*  < > 84  BUN 63*  < > 56*  CREATININE 2.59*  < > 2.21*  CALCIUM 9.1  < > 8.4*  AST 24  --   --   ALT 13*  --   --   ALKPHOS 63  --   --   BILITOT 0.8  --   --   < > = values in this interval not displayed. ------------------------------------------------------------------------------------------------------------------  Cardiac Enzymes  Recent Labs Lab 12/07/16 1852  TROPONINI 0.03*   ------------------------------------------------------------------------------------------------------------------  RADIOLOGY:  Ct Abdomen Pelvis Wo Contrast  Result Date: 12/07/2016 CLINICAL DATA:  76 year old female with abdominal pain and vomiting for 3 days. EXAM: CT ABDOMEN AND PELVIS WITHOUT CONTRAST TECHNIQUE: Multidetector CT imaging of the abdomen and pelvis was performed following the standard protocol without IV contrast. COMPARISON:  01/06/2013 CT FINDINGS: Please note that parenchymal abnormalities may be missed without intravenous contrast. Lower chest: Bilateral pleural effusions are noted, moderate to large on the  right and small on the left. Right lower lung atelectasis is present. Cardiomegaly and heavy coronary artery calcifications are  noted. Hepatobiliary: The liver is unremarkable. The patient is status post cholecystectomy. There is no evidence of biliary dilatation. Pancreas: Unremarkable Spleen: Unremarkable Adrenals/Urinary Tract: Atrophic native kidneys are noted. A transplant kidney within the left pelvis is present with mild fullness of the collecting system again noted. The adrenal glands are unremarkable. Stomach/Bowel: Colonic diverticulosis noted without definite diverticulitis. No definite areas of focal bowel wall thickening noted. There is no evidence of bowel obstruction. Vascular/Lymphatic: Heavy vascular calcifications within the abdomen pelvis are noted. There is no evidence of abdominal aortic aneurysm. No enlarged lymph nodes are identified. Reproductive: Status post hysterectomy. No adnexal masses. Other: No free fluid, pneumoperitoneum or focal collection. Musculoskeletal: A severe compression fracture of L4 has increased since 11/13/2012 but appears chronic. No definite acute osseous abnormalities noted. IMPRESSION: Follow-up pleural effusions, moderate to large on the right and small on the left with associated moderate to severe right lower lung atelectasis. No evidence of acute abnormality within the abdomen or pelvis. Left pelvic transplant kidney and atrophic native kidneys. Electronically Signed   By: Margarette Canada M.D.   On: 12/07/2016 21:15   Dg Chest Portable 1 View  Result Date: 12/07/2016 CLINICAL DATA:  Acute onset of vomiting. Subacute onset of loose stools. Initial encounter. EXAM: PORTABLE CHEST 1 VIEW COMPARISON:  Chest radiograph performed 11/29/2016 FINDINGS: Moderate right and small left pleural effusions are seen, with associated atelectasis. Mild vascular congestion is noted. No pneumothorax is identified. The cardiomediastinal silhouette is mildly enlarged. Calcification is noted at the heart. The patient is status post median sternotomy. No acute osseous abnormalities are identified. IMPRESSION:  Moderate right and small left pleural effusions, with associated atelectasis. Mild vascular congestion and mild cardiomegaly. Electronically Signed   By: Garald Balding M.D.   On: 12/07/2016 18:43     ASSESSMENT AND PLAN:   76 year old female with history of chronic diastolic heart failure and preserved ejection fraction, kidney transplant in 2001 on immunosuppressant medications, diabetes and PAF who presents with nausea, vomiting and generalized weakness and found to have acute kidney injury.  1. Acute kidney injury on chronic kidney disease stage III, vomiting and diarrhea and on immunosuppressant medications Creatinine slightly improving with IV fluids. Check Prograf level Follow up on nephrology consultation   2. History of renal transplant on immunosuppressive medications: Continue medications and check level.  3. Vomiting and diarrhea: With viral gastroenteritis Patient symptoms have improved CT of the abdomen does not show acute abdominal pathology  4. Chronic diastolic CHF: Patient continues to have pleural effusion however is not symptomatic. Appreciate cardiology consult Continue to hold torsemide due to problem #1.  5. Recurrent pleural effusion: Patient has had recent thoracentesis. Patient may need repeat thoracentesis if she is symptomatic.  6. PAF: INR is elevated and Coumadin is on hold for now. 7. Diabetes: Continue sliding scale insulin, Lantus and ADA diet  8. Hypothyroid: Continue Synthroid  9. Depression: Continue Remeron and Lexapro 10. CAD: Continue atorvastatin No aspirin as patient is on Coumadin and no beta blocker due to bradycardia.      Management plans discussed with the patient and she is in agreement.  CODE STATUS: FULL  TOTAL TIME TAKING CARE OF THIS PATIENT: 30 minutes.   D/w Dr Fletcher Anon  POSSIBLE D/C 2 days, DEPENDING ON CLINICAL CONDITION.   Mandy Peeks M.D on 12/08/2016 at 9:24 AM  Between 7am to 6pm -  Pager - [phone] After  6pm go to [email or web address] - password [email or web address] [facility]  Office  [phone]  CC: Primary care physician; Donetta Potts, MD  Note: This dictation was prepared with Dragon dictation along with smaller phrase technology. Any transcriptional errors that result from this process are unintentional.

## 2016-12-08 NOTE — Progress Notes (Signed)
ANTICOAGULATION CONSULT NOTE - Initial Consult  Pharmacy Consult for Warfarin  Indication: atrial fibrillation  Allergies  Allergen Reactions  . Azithromycin Other (See Comments)    Upset stomach  . Erythromycin Nausea And Vomiting  . Fentanyl Nausea And Vomiting  . Percocet [Oxycodone-Acetaminophen] Nausea And Vomiting    Patient Measurements: Height: 5\' 3"  (160 cm) Weight: 125 lb 9.6 oz (57 kg) IBW/kg (Calculated) : 52.4  Vital Signs: Temp: 98.1 F (36.7 C) (04/19 0754) Temp Source: Oral (04/19 0754) BP: 143/42 (04/19 0757) Pulse Rate: 56 (04/19 0757)  Labs:  Recent Labs  12/07/16 1852 12/08/16 0019 12/08/16 0633  HGB 11.2*  --  9.6*  HCT 34.0*  --  29.1*  PLT 199  --  178  APTT 50*  --   --   LABPROT 51.7*  --  53.8*  INR 5.51*  --  5.79*  CREATININE 2.59* 2.40* 2.21*  TROPONINI 0.03*  --   --     Estimated Creatinine Clearance: 17.6 mL/min (A) (by C-G formula based on SCr of 2.21 mg/dL (H)).   Medical History: Past Medical History:  Diagnosis Date  . Adenomatous polyp   . Anemia   . 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 Bradycardia   . Carotid artery stenosis    mild  . Carotid bruit    left  . Chronic kidney disease    s/p cadaveric renal transplant in 2001  . Colitis   . Compression fracture of L4 lumbar vertebra ([facility])   . Coronary artery disease   . CVA (cerebral infarction)   . Diabetes mellitus   . Diabetic retinopathy   . Esophageal candidiasis ([place])   . Flu 11/22/2016  . Fracture of right maxilla 09/21/2012  . Fx ankle   . Fx wrist   . Gastritis   . GERD (gastroesophageal reflux disease)   . H/O immunosuppressive therapy   . History of orthostatic hypotension   . Hyperlipidemia   . Hyperparathyroidism   . Hypothyroidism   . IBS (irritable bowel syndrome)   . Ischemic colitis ([place])   . Lactose intolerance   . Leg ulcer ([place])    from  poor fitting prosthetic  . Melanoma ([place])   . Nephropathy   . 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 Neuropathy   . Osteoporosis    recurrent fractures  . Pancreatic cyst   . Pneumonia 11/2016  . Pulmonary embolism ([place])   . PVD (peripheral vascular disease) ([place])   . Renal artery stenosis ([place])   . 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 Renal osteodystrophy   . Right orbit fracture ([place]) 09/21/2012  . Stroke ([place])   . TBI (traumatic brain injury) ([place]) 09/30/2012  . Tubular adenoma   . Zygoma fracture ([place]) 09/21/2012    Assessment: 76 yo female with PMH history of A. Fib.  Pharmacy consulted for warfarin dosing and monitoring. INR supratherapeutic at 5.51 on admission.   Home Regimen: Warfarin 5mg  Sun, Tues, Thurs                             Warfarin 2.5mg  Mon, Weds, Fri, Sat   DATE  INR  DOSE 4/18  5.51  HELD 4/19  5.79  HELD    Goal of Therapy:  INR 2-3 Monitor platelets by anticoagulation protocol: Yes   Plan:  INR supratherapeutic. Will hold warfarin dose for now. Will recheck INR with AM labs.   Laural Benes, PharmD, BCPS Clinical Pharmacist 12/08/2016 8:05 AM

## 2016-12-08 NOTE — Evaluation (Signed)
Physical Therapy Evaluation Patient Details Name: Elizabeth [name] MRN: [id number] DOB: November 07, 1940 Today's Date: 12/08/2016   History of Present Illness  Pt is a 76 y/o F who presents with weakness, vomiting, and diarrhea.  Pt admitted with acute kidney injury on chronic kidney disease stage III, on immunosuppresant medications.  Pt was recently admitted to [facility] in [place] for similar complaints along with influenza B and shortness of breath. Pt is on oxygen at home since this previous admission.  Chest x ray revealed R sided moderate pleural effusion and small L pleural effusion.  Pt's PMH includes bradycardia, L4 compression fx, CVA, ankle fx, wrist fx, neuropathy, osteoporosis, PVD, TBI, L BKA, renal transplant.    Clinical Impression  Pt admitted with above diagnosis. Pt currently with functional limitations due to the deficits listed below (see PT Problem List). Ms. Gerrit Heck PLOF includes ambulating household distances without AD and requiring assist to don/doff RLE prosthesis and LLE KAFO.  She currently requires mod assist for bed mobility and tolerates sitting EOB with BUE support for ~4 minutes, limited by fatigue.  She completed therapeutic exercises in the supine position today.   Pt will benefit from skilled PT to increase their independence and safety with mobility to allow discharge to the venue listed below.      Follow Up Recommendations SNF    Equipment Recommendations  None recommended by PT    Recommendations for Other Services OT consult     Precautions / Restrictions Precautions Precautions: Fall Required Braces or Orthoses: Other Brace/Splint Other Brace/Splint: L KAFO and R prosthesis in room Restrictions Weight Bearing Restrictions: No      Mobility  Bed Mobility Overal bed mobility: Needs Assistance Bed Mobility: Supine to Sit;Sit to Supine     Supine to sit: Mod assist;HOB elevated Sit to supine: Min guard   General bed mobility  comments: Assist to elevate trunk as pt with heavy use of bed rail and assist to use bed pad to scoot to EOB  Transfers                 General transfer comment: Did not attempt this session as pt fatigues after sitting EOB ~4 minutes.  In addition, pt's INR 5.79.   Ambulation/Gait                Stairs            Wheelchair Mobility    Modified Rankin (Stroke Patients Only)       Balance Overall balance assessment: Needs assistance Sitting-balance support: Bilateral upper extremity supported;Feet supported Sitting balance-Leahy Scale: Poor Sitting balance - Comments: Pt relies on BUE support and min guard assist to sit EOB for ~4 minutes before needing to return to supine due to fatigue. Postural control: Posterior lean                                   Pertinent Vitals/Pain Pain Assessment: 0-10 Pain Score: 8  Pain Location: chest and stomach Pain Descriptors / Indicators: Aching;Discomfort Pain Intervention(s): Limited activity within patient's tolerance;Monitored during session    Home Living Family/patient expects to be discharged to:: Private residence Living Arrangements: Spouse/significant other Available Help at Discharge: Family;Available 24 hours/day Type of Home: House Home Access: Stairs to enter Entrance Stairs-Rails: None Entrance Stairs-Number of Steps: 1 Home Layout: Two level;Able to live on main level with bedroom/bathroom Home Equipment: Gilford Rile - 2 wheels;Cane -  single point;Shower seat;Grab bars - tub/shower;Grab bars - toilet;Wheelchair - manual      Prior Function Level of Independence: Needs assistance   Gait / Transfers Assistance Needed: Pt has been ambulating household distances only due to weakness since flu in early April.  Pt ambulates with her L KAFP and R prosthesis.  Pt uses WC in community and her husband pushes her.  Pt denies any falls in the past 6 months.    ADL's / Homemaking Assistance Needed: Pt  requires assist from husband to don KAFO and prosthesis.  Pt ind with bathing, dressing.  Husband does the cooking, cleaning, and driving.          Hand Dominance   Dominant Hand: Right    Extremity/Trunk Assessment   Upper Extremity Assessment Upper Extremity Assessment:  (Strength grossly 3/5 BUEs)    Lower Extremity Assessment Lower Extremity Assessment: RLE deficits/detail;LLE deficits/detail RLE Deficits / Details: Strength grossly 3+/5 RLE LLE Deficits / Details: H/o L BKA, strength grossly 3+/5    Cervical / Trunk Assessment Cervical / Trunk Assessment: Kyphotic  Communication   Communication: No difficulties  Cognition Arousal/Alertness: Awake/alert Behavior During Therapy: Flat affect Overall Cognitive Status: Within Functional Limits for tasks assessed                                 General Comments: Pt answers all questions appropriately but with flat affect      General Comments      Exercises General Exercises - Upper Extremity Shoulder Flexion: AROM;Both;10 reps;Supine Elbow Flexion: Strengthening;Both;AROM;Supine;Other (comment);15 reps (light manual resistance for 5/15 of reps) Elbow Extension: AROM;Strengthening;Both;15 reps;Supine;Other (comment) (light manual resistance for 5/15 of reps) General Exercises - Lower Extremity Ankle Circles/Pumps: AROM;Right;10 reps;Supine Quad Sets: Strengthening;Both;10 reps;Supine;Other (comment) (with 3 second holds) Straight Leg Raises: Strengthening;Both;10 reps;Supine   Assessment/Plan    PT Assessment Patient needs continued PT services  PT Problem List Decreased strength;Decreased activity tolerance;Decreased balance;Decreased mobility;Decreased safety awareness;Decreased knowledge of use of DME;Pain;Cardiopulmonary status limiting activity       PT Treatment Interventions DME instruction;Gait training;Functional mobility training;Therapeutic activities;Stair training;Therapeutic  exercise;Balance training;Neuromuscular re-education;Patient/family education;Wheelchair mobility training    PT Goals (Current goals can be found in the Care Plan section)  Acute Rehab PT Goals Patient Stated Goal: to feel better PT Goal Formulation: With patient Time For Goal Achievement: 12/22/16 Potential to Achieve Goals: Fair    Frequency Min 2X/week   Barriers to discharge Decreased caregiver support Pt unable to provide level of assist that pt current requires.     Co-evaluation               End of Session Equipment Utilized During Treatment: Oxygen Activity Tolerance: Patient limited by fatigue Patient left: in bed;with call bell/phone within reach;with bed alarm set;Other (comment) (with R prevalon boot in place) Nurse Communication: Mobility status PT Visit Diagnosis: Muscle weakness (generalized) (M62.81);Unsteadiness on feet (R26.81)    Time: 3903-0092 PT Time Calculation (min) (ACUTE ONLY): 24 min   Charges:   PT Evaluation $PT Eval Moderate Complexity: 1 Procedure PT Treatments $Therapeutic Activity: 8-22 mins   PT G Codes:        Collie Siad PT, DPT 12/08/2016, 3:47 PM

## 2016-12-08 NOTE — Progress Notes (Signed)
Pt arrived from ED alert and oriented, husband at bedside. No SOB, no c/o pain. Telemetry and skin verified. Multiple skin issues, wound consult ordered. Foam placed on sacrum prophylactically. Foam placed on right elbow skin tear. Mepitel placed on small blister on right big toe. Husband removed right leg brace and left leg prosthetic, his number is on the board for anything regarding either leg prosthetic. CBG on admission 67, pt given orange juice and crackers. Per husband, pt has left sided vision impairment and requires set up for feeding.

## 2016-12-08 NOTE — Progress Notes (Signed)
Patient presently resting in the bed at this time, alert and oriented, rounded with MD at bedside, patient and family updated about plan of care, IV fluid tx continue, no respiratory distress noted

## 2016-12-09 ENCOUNTER — Inpatient Hospital Stay: Payer: Medicare Other

## 2016-12-09 LAB — URINALYSIS, ROUTINE W REFLEX MICROSCOPIC
Bilirubin Urine: NEGATIVE
Glucose, UA: 50 mg/dL — AB
Hgb urine dipstick: NEGATIVE
KETONES UR: NEGATIVE mg/dL
Leukocytes, UA: NEGATIVE
NITRITE: NEGATIVE
PROTEIN: NEGATIVE mg/dL
Specific Gravity, Urine: 1.013 (ref 1.005–1.030)
pH: 5 (ref 5.0–8.0)

## 2016-12-09 LAB — BASIC METABOLIC PANEL
Anion gap: 3 — ABNORMAL LOW (ref 5–15)
BUN: 46 mg/dL — AB (ref 6–20)
CO2: 31 mmol/L (ref 22–32)
Calcium: 8.6 mg/dL — ABNORMAL LOW (ref 8.9–10.3)
Chloride: 104 mmol/L (ref 101–111)
Creatinine, Ser: 1.9 mg/dL — ABNORMAL HIGH (ref 0.44–1.00)
GFR calc non Af Amer: 24 mL/min — ABNORMAL LOW (ref >60)
GFR, EST AFRICAN AMERICAN: 28 mL/min — AB (ref >60)
Glucose, Bld: 47 mg/dL — ABNORMAL LOW (ref 65–99)
POTASSIUM: 5.2 mmol/L — AB (ref 3.5–5.1)
SODIUM: 138 mmol/L (ref 135–145)

## 2016-12-09 LAB — BLOOD CULTURE ID PANEL (REFLEXED)
Acinetobacter baumannii: NOT DETECTED
CANDIDA GLABRATA: NOT DETECTED
CANDIDA TROPICALIS: NOT DETECTED
Candida albicans: NOT DETECTED
Candida krusei: NOT DETECTED
Candida parapsilosis: NOT DETECTED
Enterobacter cloacae complex: NOT DETECTED
Enterobacteriaceae species: NOT DETECTED
Enterococcus species: NOT DETECTED
Escherichia coli: NOT DETECTED
Haemophilus influenzae: NOT DETECTED
KLEBSIELLA PNEUMONIAE: NOT DETECTED
Klebsiella oxytoca: NOT DETECTED
Listeria monocytogenes: NOT DETECTED
NEISSERIA MENINGITIDIS: NOT DETECTED
PROTEUS SPECIES: NOT DETECTED
Pseudomonas aeruginosa: NOT DETECTED
SERRATIA MARCESCENS: NOT DETECTED
STAPHYLOCOCCUS AUREUS BCID: NOT DETECTED
STAPHYLOCOCCUS SPECIES: NOT DETECTED
STREPTOCOCCUS AGALACTIAE: NOT DETECTED
Streptococcus pneumoniae: NOT DETECTED
Streptococcus pyogenes: NOT DETECTED
Streptococcus species: NOT DETECTED

## 2016-12-09 LAB — TACROLIMUS LEVEL: TACROLIMUS (FK506) - LABCORP: 16 ng/mL (ref 2.0–20.0)

## 2016-12-09 LAB — GLUCOSE, CAPILLARY
GLUCOSE-CAPILLARY: 41 mg/dL — AB (ref 65–99)
GLUCOSE-CAPILLARY: 174 mg/dL — AB (ref 65–99)
GLUCOSE-CAPILLARY: 173 mg/dL — AB (ref 65–99)
GLUCOSE-CAPILLARY: 81 mg/dL (ref 65–99)
GLUCOSE-CAPILLARY: 125 mg/dL — AB (ref 65–99)

## 2016-12-09 LAB — LACTIC ACID, PLASMA: Lactic Acid, Venous: 1.4 mmol/L (ref 0.5–1.9)

## 2016-12-09 LAB — HEMOGLOBIN A1C
Hgb A1c MFr Bld: 6.6 % — ABNORMAL HIGH (ref 4.8–5.6)
Mean Plasma Glucose: 143 mg/dL

## 2016-12-09 LAB — PROTIME-INR
INR: 4.79
Prothrombin Time: 46.2 seconds — ABNORMAL HIGH (ref 11.4–15.2)

## 2016-12-09 MED ORDER — METHYLPREDNISOLONE SODIUM SUCC 40 MG IJ SOLR
40.0000 mg | INTRAMUSCULAR | Status: DC
Start: 2016-12-09 — End: 2016-12-09

## 2016-12-09 MED ORDER — METHYLPREDNISOLONE SODIUM SUCC 40 MG IJ SOLR
10.0000 mg | Freq: Once | INTRAMUSCULAR | Status: AC
  Administered 2016-12-12: 10 mg via INTRAVENOUS
  Filled 2016-12-09: qty 1

## 2016-12-09 MED ORDER — METHYLPREDNISOLONE SODIUM SUCC 40 MG IJ SOLR
20.0000 mg | Freq: Once | INTRAMUSCULAR | Status: AC
  Administered 2016-12-11: 20 mg via INTRAVENOUS
  Filled 2016-12-09: qty 1

## 2016-12-09 MED ORDER — DEXTROSE 50 % IV SOLN
INTRAVENOUS | Status: AC
  Administered 2016-12-09: 50 mL
  Filled 2016-12-09: qty 50

## 2016-12-09 MED ORDER — TACROLIMUS 1 MG PO CAPS
1.0000 mg | ORAL_CAPSULE | Freq: Two times a day (BID) | ORAL | Status: DC
  Administered 2016-12-09 – 2016-12-16 (×14): 1 mg via ORAL
  Filled 2016-12-09 (×15): qty 1

## 2016-12-09 MED ORDER — CEFEPIME-DEXTROSE 1 GM/50ML IV SOLR
1.0000 g | INTRAVENOUS | Status: DC
  Administered 2016-12-09 – 2016-12-12 (×4): 1 g via INTRAVENOUS
  Filled 2016-12-09 (×4): qty 50

## 2016-12-09 MED ORDER — METHYLPREDNISOLONE SODIUM SUCC 40 MG IJ SOLR
40.0000 mg | Freq: Once | INTRAMUSCULAR | Status: AC
  Administered 2016-12-09: 40 mg via INTRAVENOUS
  Filled 2016-12-09: qty 1

## 2016-12-09 MED ORDER — DEXTROSE-NACL 5-0.45 % IV SOLN
INTRAVENOUS | Status: DC
  Administered 2016-12-09 – 2016-12-10 (×2): via INTRAVENOUS

## 2016-12-09 MED ORDER — METHYLPREDNISOLONE SODIUM SUCC 40 MG IJ SOLR
30.0000 mg | Freq: Once | INTRAMUSCULAR | Status: AC
  Administered 2016-12-10: 30 mg via INTRAVENOUS
  Filled 2016-12-09: qty 1

## 2016-12-09 NOTE — Evaluation (Addendum)
Clinical/Bedside Swallow Evaluation Patient Details  Name: Elizabeth [name] MRN: [id number] Date of Birth: January 25, 1941  Today's Date: 12/09/2016 Time: SLP Start Time (ACUTE ONLY): 1210 SLP Stop Time (ACUTE ONLY): 1310 SLP Time Calculation (min) (ACUTE ONLY): 60 min  Past Medical History:  Past Medical History:  Diagnosis Date  . Adenomatous polyp   . Anemia   . 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 Atrial fibrillation ([place])    a. on Coumadin. No rate-controlling medications with baseline bradycardia.   . Bradycardia   . CAD (coronary artery disease)    a. s/p 1-vessel CABG to LCx in 2001  . Carotid artery stenosis    mild  . Carotid bruit    left  . Chronic kidney disease    s/p cadaveric renal transplant in 2001  . Colitis   . Compression fracture of L4 lumbar vertebra (HCC)   . Coronary artery disease   . CVA (cerebral infarction)   . Diabetes mellitus   . Diabetic retinopathy   . Esophageal candidiasis ([place])   . Flu 11/22/2016  . Fracture of right maxilla 09/21/2012  . Fx ankle   . Fx wrist   . Gastritis   . GERD (gastroesophageal reflux disease)   . H/O immunosuppressive therapy   . History of orthostatic hypotension   . Hyperlipidemia   . Hyperparathyroidism   . Hypothyroidism   . IBS (irritable bowel syndrome)   . Ischemic colitis ([place])   . Lactose intolerance   . Leg ulcer ([place])    from poor fitting prosthetic  . Melanoma ([place])   . Nephropathy   . 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 Neuropathy   . Osteoporosis    recurrent fractures  . Pancreatic cyst   . Pneumonia 11/2016  . Pulmonary embolism ([place])   . PVD (peripheral vascular disease) ([place])   . Renal artery stenosis ([place])   . 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 Renal osteodystrophy   . Right orbit fracture ([place]) 09/21/2012  . Stroke ([place])   . TBI (traumatic brain injury) ([place]) 09/30/2012   . Tubular adenoma   . Zygoma fracture ([place]) 09/21/2012   Past Surgical History:  Past Surgical History:  Procedure Laterality Date  . ABDOMINAL HYSTERECTOMY    . APPENDECTOMY    . BELOW KNEE LEG AMPUTATION     left, charcott joint from neuropathy  . CATARACT EXTRACTION    . CESAREAN SECTION     x 2  . CHOLECYSTECTOMY    . COLONOSCOPY  01/30/2012   Procedure: COLONOSCOPY;  Surgeon: Milus Banister, MD;  Location: [facility];  Service: Endoscopy;  Laterality: N/A;  . COLONOSCOPY W/ BIOPSIES AND POLYPECTOMY  02/18/2009   adenomatous polyps, diverticulosis, internal hemorrhoids  . CORONARY ARTERY BYPASS GRAFT    . ESOPHAGOGASTRODUODENOSCOPY  05/02/2012   Procedure: ESOPHAGOGASTRODUODENOSCOPY (EGD);  Surgeon: Gatha Mayer, MD;  Location: [facility];  Service: Endoscopy;  Laterality: N/A;  . EUS  02/04/2010   w/FNA, pancreatic cyst  . IR THORACENTESIS ASP PLEURAL SPACE W/IMG GUIDE  11/24/2016  . KIDNEY TRANSPLANT  08/15/2000   [facility] in [place]  . THORACENTESIS  07/2016, 08/2016, 09/2016   Ordered by Dr. Vella Kohler. Surg: Dr.Green, Dr. Anselm Pancoast  . THYROID SURGERY    . TONSILLECTOMY    . UPPER GASTROINTESTINAL ENDOSCOPY  12/01/2009  . VITRECTOMY     right  eye   HPI:  Pt  is a 76 y.o. female with a known history of Hypertension, diabetes, chronic diastolic CHF, kidney transplant on immunosuppressants, CKD stage III, history of PE presents to the emergency room from home due to weakness, vomiting and diarrhea. Patient does have chronic on and off nausea, vomiting and diarrhea but it is significantly worse. She was recently admitted to [facility] in [place] for similar complaints along with influenza B and shortness of breath. She had right thoracentesis with 1 L transudate removed. Treated with Tamiflu and Levaquin. Finished antibiotics in the hospital. She continued to have nausea and vomiting along with diarrhea on day of discharge but was told this was due to gastroparesis  and influenza. He was continued and remain the same. She has not taken her torsemide as she was concerned about dehydration. Other medications have remained the same. With the home health nurse blood pressure was noticed to be low in systolic of 69S. Here in the emergency room patient has been found to have acute kidney injury. Chest x-ray shows right-sided moderate pleural effusion and a small left pleural effusion. The last x-ray at [facility] showed bilateral moderate pleural effusions. She is also on oxygen at home since recent admission. Daughter and pt endorse ongoing Vomiting issues including vomiting post breakfast meal this morning; NSG has given medication for N/V per report. Daughter/pt endorsed feelings of foods "getting stuck in her mid-chest" frequently.   Assessment / Plan / Recommendation Clinical Impression  Pt appears to present w/ an adequate oropharyngeal phase swallow function w/ no overt s/s of aspiration w/ po trials; oral phase wfl for bolus management and clearing. However, pt/family endorse s/s of Reflux and Esophageal phase Dysmotility including regurgitation/vomiting of food material post meals as well as the feeling of food "getting stuck in her mid-chest" area. Any regurgitation increases pt's risk for aspiration of Reflux material thus pulmonary issues. Recommend GI consult for further assessment. Recommend a modified diet w/ thin liquids; general aspiration and reflux precautions; Pills in Puree if easier for swallowing for pt; assistance at meals to aid pt in her self-feeding. NSG/MD updated.  SLP Visit Diagnosis: Dysphagia, unspecified (R13.10) (Esophageal phase)    Aspiration Risk  Mild aspiration risk (of Reflux/vomit material); reduced from an oropharyngeal phase standpoint   Diet Recommendation  Mech soft diet(foods cut small, moistened well); Thin liquids. General aspiration precautions; Reflux precautions  Medication Administration: Whole meds with puree (if easier  for swallowing for pt)    Other  Recommendations Recommended Consults: Consider GI evaluation;Consider esophageal assessment (Dietician following) Oral Care Recommendations: Oral care BID;Staff/trained caregiver to provide oral care   Follow up Recommendations None      Frequency and Duration  n/a         Prognosis Prognosis for Safe Diet Advancement: Good Barriers to Reach Goals:  (GI issues)      Swallow Study   General Date of Onset: 12/07/16 HPI: Pt  is a 76 y.o. female with a known history of Hypertension, diabetes, chronic diastolic CHF, kidney transplant on immunosuppressants, CKD stage III, history of PE presents to the emergency room from home due to weakness, vomiting and diarrhea. Patient does have chronic on and off nausea, vomiting and diarrhea but it is significantly worse. She was recently admitted to [facility] in [place] for similar complaints along with influenza B and shortness of breath. She had right thoracentesis with 1 L transudate removed. Treated with Tamiflu and Levaquin. Finished antibiotics in  the hospital. She continued to have nausea and vomiting along with diarrhea on day of discharge but was told this was due to gastroparesis and influenza. He was continued and remain the same. She has not taken her torsemide as she was concerned about dehydration. Other medications have remained the same. With the home health nurse blood pressure was noticed to be low in systolic of 13Y. Here in the emergency room patient has been found to have acute kidney injury. Chest x-ray shows right-sided moderate pleural effusion and a small left pleural effusion. The last x-ray at [facility] showed bilateral moderate pleural effusions. She is also on oxygen at home since recent admission. Daughter and pt endorse ongoing Vomiting issues including vomiting post breakfast meal this morning; NSG has given medication for N/V per report. Daughter/pt endorsed feelings of foods  "getting stuck in her mid-chest" frequently. Type of Study: Bedside Swallow Evaluation Previous Swallow Assessment: none Diet Prior to this Study: Regular;Thin liquids Temperature Spikes Noted: No (97.8; has been on a cooling blanket) Respiratory Status: Nasal cannula (2 liters) History of Recent Intubation: No Behavior/Cognition: Alert;Cooperative;Pleasant mood;Distractible;Requires cueing Oral Cavity Assessment: Dry;Lesions ((on lip)) Oral Care Completed by SLP: Recent completion by staff Oral Cavity - Dentition: Adequate natural dentition;Missing dentition Vision: Impaired for self-feeding Self-Feeding Abilities: Able to feed self;Needs assist;Needs set up;Total assist Patient Positioning: Upright in bed Baseline Vocal Quality: Normal;Low vocal intensity Volitional Cough: Strong Volitional Swallow: Able to elicit    Oral/Motor/Sensory Function Overall Oral Motor/Sensory Function: Within functional limits   Ice Chips Ice chips: Within functional limits Presentation: Spoon (2 trials)   Thin Liquid Thin Liquid: Within functional limits Presentation: Cup;Self Fed;Straw (8 trials) Other Comments: cup bothered her lower lip(sore)    Nectar Thick Nectar Thick Liquid: Not tested   Honey Thick Honey Thick Liquid: Not tested   Puree Puree: Within functional limits Presentation: Self Fed;Spoon (5 trials)   Solid   GO   Solid: Not tested Other Comments: d/t recent N/V          Orinda Kenner, MS, CCC-SLP Zarius Furr 12/09/2016,3:00 PM

## 2016-12-09 NOTE — Progress Notes (Signed)
ANTICOAGULATION CONSULT NOTE - Initial Consult  Pharmacy Consult for Warfarin  Indication: atrial fibrillation  Allergies  Allergen Reactions  . Azithromycin Other (See Comments)    Upset stomach  . Erythromycin Nausea And Vomiting  . Fentanyl Nausea And Vomiting  . Percocet [Oxycodone-Acetaminophen] Nausea And Vomiting    Patient Measurements: Height: 5\' 3"  (160 cm) Weight: 124 lb 9.6 oz (56.5 kg) IBW/kg (Calculated) : 52.4  Vital Signs: Temp: 97.9 F (36.6 C) (04/20 0422) BP: 132/54 (04/20 0422) Pulse Rate: 54 (04/20 0422)  Labs:  Recent Labs  12/07/16 1852 12/08/16 0019 12/08/16 0633 12/09/16 0432  HGB 11.2*  --  9.6*  --   HCT 34.0*  --  29.1*  --   PLT 199  --  178  --   APTT 50*  --   --   --   LABPROT 51.7*  --  53.8* 46.2*  INR 5.51*  --  5.79* 4.79*  CREATININE 2.59* 2.40* 2.21*  --   TROPONINI 0.03*  --   --   --     Estimated Creatinine Clearance: 17.6 mL/min (A) (by C-G formula based on SCr of 2.21 mg/dL (H)).   Medical History: Past Medical History:  Diagnosis Date  . Adenomatous polyp   . Anemia   . 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 Atrial fibrillation ([place])    a. on Coumadin. No rate-controlling medications with baseline bradycardia.   . Bradycardia   . CAD (coronary artery disease)    a. s/p 1-vessel CABG to LCx in 2001  . Carotid artery stenosis    mild  . Carotid bruit    left  . Chronic kidney disease    s/p cadaveric renal transplant in 2001  . Colitis   . Compression fracture of L4 lumbar vertebra (HCC)   . Coronary artery disease   . CVA (cerebral infarction)   . Diabetes mellitus   . Diabetic retinopathy   . Esophageal candidiasis ([place])   . Flu 11/22/2016  . Fracture of right maxilla 09/21/2012  . Fx ankle   . Fx wrist   . Gastritis   . GERD (gastroesophageal reflux disease)   . H/O immunosuppressive therapy   . History of  orthostatic hypotension   . Hyperlipidemia   . Hyperparathyroidism   . Hypothyroidism   . IBS (irritable bowel syndrome)   . Ischemic colitis ([place])   . Lactose intolerance   . Leg ulcer ([place])    from poor fitting prosthetic  . Melanoma ([place])   . Nephropathy   . 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 Neuropathy   . Osteoporosis    recurrent fractures  . Pancreatic cyst   . Pneumonia 11/2016  . Pulmonary embolism ([place])   . PVD (peripheral vascular disease) ([place])   . Renal artery stenosis ([place])   . 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 Renal osteodystrophy   . Right orbit fracture ([place]) 09/21/2012  . Stroke ([place])   . TBI (traumatic brain injury) ([place]) 09/30/2012  . Tubular adenoma   . Zygoma fracture ([place]) 09/21/2012    Assessment: 76 yo female with PMH history of A. Fib.  Pharmacy consulted for warfarin dosing and monitoring. INR supratherapeutic at 5.51 on admission.   Home Regimen: Warfarin 5mg  Sun, Tues, Thurs  Warfarin 2.5mg  Mon, Weds, Fri, Sat   DATE  INR  DOSE 4/18  5.51  HELD 4/19  5.79  HELD 4/20  4.79  HELD  Goal of Therapy:  INR 2-3 Monitor platelets by anticoagulation protocol: Yes   Plan:  INR supratherapeutic. Will hold warfarin dose for now. Will recheck INR with AM labs.   Laural Benes, PharmD, BCPS Clinical Pharmacist 12/09/2016 7:24 AM

## 2016-12-09 NOTE — Progress Notes (Signed)
Progress Note  Patient Name: Elizabeth [name] Date of Encounter: 12/09/2016  Primary Cardiologist: Dr. Rockey Situ  Subjective   Hypothermic and hypoglycemic this AM (now improved, warming blankets in place). Reports still being nauseated. No recent chest pain or palpitations. Breathing at baseline.   Inpatient Medications    Scheduled Meds: . atorvastatin  10 mg Oral QODAY  . bacitracin   Topical BID  . escitalopram  10 mg Oral Daily  . feeding supplement (GLUCERNA SHAKE)  237 mL Oral TID BM  . ferrous sulfate  325 mg Oral Daily  . insulin aspart  0-9 Units Subcutaneous TID WC  . levothyroxine  112 mcg Oral QAC breakfast  . mouth rinse  15 mL Mouth Rinse BID  . mirtazapine  7.5 mg Oral QHS  . mycophenolate  360 mg Oral BID  . pantoprazole  40 mg Oral Daily  . predniSONE  5 mg Oral q morning - 10a  . sodium chloride flush  3 mL Intravenous Q12H  . tacrolimus  1 mg Oral QHS  . tacrolimus  2 mg Oral q morning - 10a  . Warfarin - Pharmacist Dosing Inpatient   Does not apply q1800   Continuous Infusions: . ceFEPIme    . dextrose 5 % and 0.45% NaCl 50 mL/hr at 12/09/16 0847   PRN Meds: acetaminophen **OR** acetaminophen, albuterol, HYDROcodone-acetaminophen, hyoscyamine, ondansetron **OR** ondansetron (ZOFRAN) IV, polyethylene glycol   Vital Signs    Vitals:   12/08/16 2040 12/09/16 0422 12/09/16 0752 12/09/16 0850  BP: (!) 140/53 (!) 132/54 (!) 161/60   Pulse: (!) 50 (!) 54 (!) 128   Resp: 14 14 13    Temp: 98.1 F (36.7 C) 97.9 F (36.6 C)  (!) 94.3 F (34.6 C)  TempSrc:    Rectal  SpO2: 95% 96% (!) 81%   Weight:  124 lb 9.6 oz (56.5 kg)    Height:        Intake/Output Summary (Last 24 hours) at 12/09/16 1011 Last data filed at 12/09/16 0900  Gross per 24 hour  Intake              120 ml  Output              100 ml  Net               20 ml   Filed Weights   12/07/16 2333 12/08/16 0418 12/09/16 0422  Weight: 124 lb 11.2 oz (56.6 kg) 125 lb 9.6 oz (57 kg) 124  lb 9.6 oz (56.5 kg)    Telemetry    Sinus bradycardia, HR in mid-40's to 60's. Occasional PAC's.  - Personally Reviewed  ECG    No new tracings.   Physical Exam   General: Well developed, elderly Caucasian female appearing in no acute distress. Head: Normocephalic, atraumatic.  Neck: Supple without bruits, JVD not elevated. Lungs:  Resp regular and unlabored, CTA without wheezing or rales. Heart: Regular rhythm, bradycardiac rate, S1, S2, no S3, S4, or murmur; no rub. Abdomen: Soft, non-tender, non-distended with normoactive bowel sounds. No hepatomegaly. No rebound/guarding. No obvious abdominal masses. Extremities: No clubbing, cyanosis, or lower extremity edema. Distal pedal pulses are 2+ bilaterally. Neuro: Alert and oriented X 3. Moves all extremities spontaneously. Psych: Normal affect.  Labs    Chemistry Recent Labs Lab 12/07/16 1852 12/08/16 0019 12/08/16 0633 12/09/16 0734  NA 135 134* 134* 138  K 6.3* 5.3* 5.2* 5.2*  CL 98* 98* 100* 104  CO2 33* 32  32 31  GLUCOSE 120* 72 84 47*  BUN 63* 60* 56* 46*  CREATININE 2.59* 2.40* 2.21* 1.90*  CALCIUM 9.1 8.6* 8.4* 8.6*  PROT 5.7*  --   --   --   ALBUMIN 3.1*  --   --   --   AST 24  --   --   --   ALT 13*  --   --   --   ALKPHOS 63  --   --   --   BILITOT 0.8  --   --   --   GFRNONAA 17* 18* 20* 24*  GFRAA 19* 21* 24* 28*  ANIONGAP 4* 4* 2* 3*     Hematology Recent Labs Lab 12/07/16 1852 12/08/16 0633  WBC 9.4 4.4  RBC 3.64* 3.29*  HGB 11.2* 9.6*  HCT 34.0* 29.1*  MCV 93.5 88.3  MCH 30.7 29.2  MCHC 32.8 33.0  RDW 16.0* 16.2*  PLT 199 178    Cardiac Enzymes Recent Labs Lab 12/07/16 1852  TROPONINI 0.03*   No results for input(s): TROPIPOC in the last 168 hours.   BNP Recent Labs Lab 12/07/16 1840  BNP 456.0*     DDimer No results for input(s): DDIMER in the last 168 hours.   Radiology    Ct Abdomen Pelvis Wo Contrast  Result Date: 12/07/2016 CLINICAL DATA:  76 year old female with  abdominal pain and vomiting for 3 days. EXAM: CT ABDOMEN AND PELVIS WITHOUT CONTRAST TECHNIQUE: Multidetector CT imaging of the abdomen and pelvis was performed following the standard protocol without IV contrast. COMPARISON:  01/06/2013 CT FINDINGS: Please note that parenchymal abnormalities may be missed without intravenous contrast. Lower chest: Bilateral pleural effusions are noted, moderate to large on the right and small on the left. Right lower lung atelectasis is present. Cardiomegaly and heavy coronary artery calcifications are noted. Hepatobiliary: The liver is unremarkable. The patient is status post cholecystectomy. There is no evidence of biliary dilatation. Pancreas: Unremarkable Spleen: Unremarkable Adrenals/Urinary Tract: Atrophic native kidneys are noted. A transplant kidney within the left pelvis is present with mild fullness of the collecting system again noted. The adrenal glands are unremarkable. Stomach/Bowel: Colonic diverticulosis noted without definite diverticulitis. No definite areas of focal bowel wall thickening noted. There is no evidence of bowel obstruction. Vascular/Lymphatic: Heavy vascular calcifications within the abdomen pelvis are noted. There is no evidence of abdominal aortic aneurysm. No enlarged lymph nodes are identified. Reproductive: Status post hysterectomy. No adnexal masses. Other: No free fluid, pneumoperitoneum or focal collection. Musculoskeletal: A severe compression fracture of L4 has increased since 11/13/2012 but appears chronic. No definite acute osseous abnormalities noted. IMPRESSION: Follow-up pleural effusions, moderate to large on the right and small on the left with associated moderate to severe right lower lung atelectasis. No evidence of acute abnormality within the abdomen or pelvis. Left pelvic transplant kidney and atrophic native kidneys. Electronically Signed   By: Margarette Canada M.D.   On: 12/07/2016 21:15   Dg Chest 1 View  Result Date:  12/09/2016 CLINICAL DATA:  Follow-up pneumonia EXAM: CHEST 1 VIEW COMPARISON:  12/07/2016 FINDINGS: Cardiac shadow is stable. Postoperative changes are again seen. Bilateral pleural effusions right greater than left are noted. Right basilar infiltrate is again seen and stable. No bony abnormality noted. IMPRESSION: Stable right basilar infiltrate and effusion. Electronically Signed   By: Inez Catalina M.D.   On: 12/09/2016 09:13   Dg Chest Portable 1 View  Result Date: 12/07/2016 CLINICAL DATA:  Acute onset of vomiting.  Subacute onset of loose stools. Initial encounter. EXAM: PORTABLE CHEST 1 VIEW COMPARISON:  Chest radiograph performed 11/29/2016 FINDINGS: Moderate right and small left pleural effusions are seen, with associated atelectasis. Mild vascular congestion is noted. No pneumothorax is identified. The cardiomediastinal silhouette is mildly enlarged. Calcification is noted at the heart. The patient is status post median sternotomy. No acute osseous abnormalities are identified. IMPRESSION: Moderate right and small left pleural effusions, with associated atelectasis. Mild vascular congestion and mild cardiomegaly. Electronically Signed   By: Garald Balding M.D.   On: 12/07/2016 18:43    Cardiac Studies   Echocardiogram: 11/2013 Study Conclusions  - Left ventricle: The cavity size was normal. There was mild concentric hypertrophy. Systolic function was vigorous. The estimated ejection fraction was in the range of 65% to 70%. Wall motion was normal; there were no regional wall motion abnormalities. Features are consistent with a pseudonormal left ventricular filling pattern, with concomitant abnormal relaxation and increased filling pressure (grade 2 diastolic dysfunction). Doppler parameters are consistent with elevated ventricular end-diastolic filling pressure. - Aortic valve: Mild regurgitation. - Mitral valve: Severely thickened and calcified mitral annulus  involving leaflets with restricted leaflet opening. Mild mitral stenosis. Moderate regurgitation directed posteriorly. - Left atrium: The atrium was moderately dilated. - Right ventricle: Systolic function was moderately reduced. - Right atrium: The atrium was mildly dilated. - Tricuspid valve: Moderate regurgitation. - Pericardium, extracardiac: A trivial pericardial effusion was identified. There was a left pleural effusion.  Patient Profile     76 y.o. female w/ PMH of CAD (s/p 1-vessel CABG to LCx in 2001), chronic diastolic CHF (EF 93-26% by echo in 11/2013), ESRD (s/p kidney transplant in 2001), PVD (s/p left BKA), prior CVA and PE (on Coumadin), orthostatic hypotension, recurrent pleural effusions, Type 2 DM and PAF who presented to [facility] on 12/07/2016 for progressive weakness in the setting of N/V. Found to have an AKI.   Assessment & Plan    1. Chronic Diastolic CHF - echo from 7124 showed a preserved EF of 65-70% with Grade 2 DD and no WMA.  - admitted with nausea and vomiting, found to have an AKI in the setting of dehydration.  - she does not appear volume overloaded on physical examination yet BNP was mildly elevated (at 456) which is likely influenced by her AKI. Agree with holding PTA Torsemide and continuing low-dose IVF at 50 mL/hr.  - weight at time of hospital discharge on 4/13 was 117 lbs, yet she was at 132 lbs in 10/2016. Weight at 124 lbs today. Monitor fluid status closely while receiving IVF.   2. AKI -  s/p kidney transplant in 2001. - creatinine elevated to 2.59 on admission, improved to 1.90 this AM. At 1.60 on 4/13. - Nephrology following.  3. Recurrent Pleural Effusions - has required multiple thoracentesis in the past. Most recent was a right thoracentesis performed on 4/5 with over 1L of fluid removed (cultures negative).  - CT this admission shows a moderate to large pleural effusion on the right, small on the left.  - continue to follow. May  require repeat procedure this admission but need to wait until INR is within a safe range (at 4.79 this AM).   4. CAD - s/p 1-vessel CABG to LCx in 2001.  - she denies any recent anginal symptoms.  - continue statin therapy. No ASA secondary to need for Coumadin. No BB with baseline bradycardia.   5. Paroxysmal Atrial Fibrillation - This patients CHA2DS2-VASc Score and unadjusted Ischemic  Stroke Rate (% per year) is equal to 10.8 % stroke rate/year from a score of 8 (HTN, DM, Vascular, Female, Age (2), CVA(2)). On Coumadin PTA. Currently held as INR is supratherapeutic at 4.79. Pharmacy following. - maintaining NSR. Not on rate-controlling medications secondary to baseline bradycardia.   6. Nausea and Vomiting - CT Abdomen without acute findings. Likely secondary to viral gastroenteritis.  - per admitting team  7. Type 2 DM - per admitting team.   8. Positive Blood Cultures/ Hypothermia - blood cultures showing GPC. Has been started on Cefepime.  - per admitting team.   Signed, Erma Heritage , PA-C 10:11 AM 12/09/2016 Pager: [phone]

## 2016-12-09 NOTE — Progress Notes (Signed)
Patient was lethargic and difficult to arouse this am. Patient's blood sugar was 41.  Patient was given 1amp IV dextrose and it came up to 174. Patient was cold and shivering.  DIfficult to obtain an oral temp on patient. Oral temp finally read 92.39F on disposable thermometer. Assessed temp rectally and 94.23F. Dr.Mody made aware. Orders received for warming blanket, blood cultures, chest xray, urinalysis, and to change IV fluids to D5 1/2 normal saline.

## 2016-12-09 NOTE — Progress Notes (Signed)
CXR shows RLL infiltrate  Now on cefepime for HCAP and will ordered speech eval

## 2016-12-09 NOTE — Care Management Note (Signed)
Case Management Note  Patient Details  Name: Elizabeth [name] MRN: [id number] Date of Birth: 01/06/1941  Subjective/Objective:              Recent discharge from [facility] with home Health ordered through [facility] PT OT Aide , SW and new home 02.  Notified agency of admission.  At present, anticipating skilled nursing placement     Action/Plan:   Expected Discharge Date:                  Expected Discharge Plan:     In-House Referral:   CM  Discharge planning Services     Post Acute Care Choice:    Choice offered to:     DME Arranged:    DME Agency:     HH Arranged:    [place] Agency:     Status of Service:     If discussed at [employer], dates discussed:    Additional Comments:  Katrina Stack, RN 12/09/2016, 4:39 PM

## 2016-12-09 NOTE — Progress Notes (Signed)
Inpatient Diabetes Program Recommendations  AACE/ADA: New Consensus Statement on Inpatient Glycemic Control (2015)  Target Ranges:  Prepandial:   less than 140 mg/dL      Peak postprandial:   less than 180 mg/dL (1-2 hours)      Critically ill patients:  140 - 180 mg/dL   Lab Results  Component Value Date   GLUCAP 41 (LL) 12/09/2016   HGBA1C 6.6 (H) 12/08/2016    Review of Glycemic Control  Results for MAKELLE, MARRONE (MRN [id number]) as of 12/09/2016 08:28  Ref. Range 12/08/2016 13:48 12/08/2016 16:57 12/08/2016 21:06 12/08/2016 21:34 12/09/2016 07:50  Glucose-Capillary Latest Ref Range: 65 - 99 mg/dL 96 89 55 (L) 84 41 (LL)    Diabetes history:DM   Outpatient Diabetes medications: Lantus 8 units Daily, Humalog 0-9 units 4x/daily  Current orders for Inpatient glycemic control: Novolog Sensitive Correction (0-9units) tid , Lantus 7 units qday  Inpatient Diabetes Program Recommendations: Please discontinue Lantus until blood sugars are consistently above 180mg /dl.   Gentry Fitz, RN, BA, MHA, CDE Diabetes Coordinator Inpatient Diabetes Program  [phone] (Team Pager) [phone] ([place]) 12/09/2016 8:33 AM

## 2016-12-09 NOTE — Progress Notes (Addendum)
[facility] [place] NAME: Elizabeth [name]    MR#:  [id number]  DATE OF BIRTH:  1940/11/30  SUBJECTIVE:   She had hypothermia and low blood sugar this am   REVIEW OF SYSTEMS:    Review of Systems  Constitutional: Negative for fever, chills +++weight loss ++Tired and weak HENT: Negative for ear pain, nosebleeds, congestion, facial swelling, rhinorrhea, neck pain, neck stiffness and ear discharge.   Respiratory: Negative for cough, shortness of breath, wheezing  Cardiovascular: Negative for chest pain, palpitations and leg swelling.  Gastrointestinal: Negative for heartburn, abdominal pain, vomiting, diarrhea or consitpation Genitourinary: Negative for dysuria, urgency, frequency, hematuria Musculoskeletal: Negative for back pain or joint pain Neurological: Negative for dizziness, seizures, syncope, focal weakness,  numbness and headaches.  Hematological: Does not bruise/bleed easily.  Psychiatric/Behavioral: Negative for hallucinations, confusion, dysphoric mood    Tolerating Diet:yes      DRUG ALLERGIES:   Allergies  Allergen Reactions  . Azithromycin Other (See Comments)    Upset stomach  . Erythromycin Nausea And Vomiting  . Fentanyl Nausea And Vomiting  . Percocet [Oxycodone-Acetaminophen] Nausea And Vomiting    VITALS:  Blood pressure (!) 161/60, pulse (!) 128, temperature 97.9 F (36.6 C), resp. rate 13, height 5\' 3"  (1.6 m), weight 56.5 kg (124 lb 9.6 oz), SpO2 (!) 81 %.  PHYSICAL EXAMINATION:  Constitutional: Appears well-developed and well-nourished. No distress. HENT: Normocephalic. Marland Kitchen Oropharynx is clear and moist.  Eyes: Conjunctivae and EOM are normal. PERRLA, no scleral icterus. Mucus membranes dry Neck: Normal ROM. Neck supple. No JVD. No tracheal deviation. CVS: RRR, S1/S2 +, no murmurs, no gallops, no carotid bruit.  Pulmonary: Effort and breath sounds normal, no stridor, rhonchi, wheezes, rales.   Abdominal: Soft. BS +,  no distension, tenderness, rebound or guarding.  Musculoskeletal: Normal range of motion. No edema and no tenderness. Left BKA Neuro: Alert. CN 2-12 grossly intact. No focal deficits. Skin: Skin is warm and dry. No rash noted. Left cheek she has removed skin cacner Psychiatric: Normal mood and affect.      LABORATORY PANEL:   CBC  Recent Labs Lab 12/08/16 0633  WBC 4.4  HGB 9.6*  HCT 29.1*  PLT 178   ------------------------------------------------------------------------------------------------------------------  Chemistries   Recent Labs Lab 12/07/16 1852  12/09/16 0734  NA 135  < > 138  K 6.3*  < > 5.2*  CL 98*  < > 104  CO2 33*  < > 31  GLUCOSE 120*  < > 47*  BUN 63*  < > 46*  CREATININE 2.59*  < > 1.90*  CALCIUM 9.1  < > 8.6*  AST 24  --   --   ALT 13*  --   --   ALKPHOS 63  --   --   BILITOT 0.8  --   --   < > = values in this interval not displayed. ------------------------------------------------------------------------------------------------------------------  Cardiac Enzymes  Recent Labs Lab 12/07/16 1852  TROPONINI 0.03*   ------------------------------------------------------------------------------------------------------------------  RADIOLOGY:  Ct Abdomen Pelvis Wo Contrast  Result Date: 12/07/2016 CLINICAL DATA:  76 year old female with abdominal pain and vomiting for 3 days. EXAM: CT ABDOMEN AND PELVIS WITHOUT CONTRAST TECHNIQUE: Multidetector CT imaging of the abdomen and pelvis was performed following the standard protocol without IV contrast. COMPARISON:  01/06/2013 CT FINDINGS: Please note that parenchymal abnormalities may be missed without intravenous contrast. Lower chest: Bilateral pleural effusions are noted, moderate to large on the right and small on the  left. Right lower lung atelectasis is present. Cardiomegaly and heavy coronary artery calcifications are noted. Hepatobiliary: The liver is unremarkable.  The patient is status post cholecystectomy. There is no evidence of biliary dilatation. Pancreas: Unremarkable Spleen: Unremarkable Adrenals/Urinary Tract: Atrophic native kidneys are noted. A transplant kidney within the left pelvis is present with mild fullness of the collecting system again noted. The adrenal glands are unremarkable. Stomach/Bowel: Colonic diverticulosis noted without definite diverticulitis. No definite areas of focal bowel wall thickening noted. There is no evidence of bowel obstruction. Vascular/Lymphatic: Heavy vascular calcifications within the abdomen pelvis are noted. There is no evidence of abdominal aortic aneurysm. No enlarged lymph nodes are identified. Reproductive: Status post hysterectomy. No adnexal masses. Other: No free fluid, pneumoperitoneum or focal collection. Musculoskeletal: A severe compression fracture of L4 has increased since 11/13/2012 but appears chronic. No definite acute osseous abnormalities noted. IMPRESSION: Follow-up pleural effusions, moderate to large on the right and small on the left with associated moderate to severe right lower lung atelectasis. No evidence of acute abnormality within the abdomen or pelvis. Left pelvic transplant kidney and atrophic native kidneys. Electronically Signed   By: Margarette Canada M.D.   On: 12/07/2016 21:15   Dg Chest Portable 1 View  Result Date: 12/07/2016 CLINICAL DATA:  Acute onset of vomiting. Subacute onset of loose stools. Initial encounter. EXAM: PORTABLE CHEST 1 VIEW COMPARISON:  Chest radiograph performed 11/29/2016 FINDINGS: Moderate right and small left pleural effusions are seen, with associated atelectasis. Mild vascular congestion is noted. No pneumothorax is identified. The cardiomediastinal silhouette is mildly enlarged. Calcification is noted at the heart. The patient is status post median sternotomy. No acute osseous abnormalities are identified. IMPRESSION: Moderate right and small left pleural effusions,  with associated atelectasis. Mild vascular congestion and mild cardiomegaly. Electronically Signed   By: Garald Balding M.D.   On: 12/07/2016 18:43     ASSESSMENT AND PLAN:   76 year old female with history of chronic diastolic heart failure and preserved ejection fraction, kidney transplant in 2001 on immunosuppressant medications, diabetes and PAF who presents with nausea, vomiting and generalized weakness and found to have acute kidney injury.  1. Acute kidney injury on chronic kidney disease stage III, vomiting and diarrhea and on immunosuppressant medications Creatinine improving with IV fluids.  Prograf level pending Appreciate nephrology consultation  2. Hypothermia: Possibly from hypoglycemia Will order blood cultures, CXR and UA, lactic acid Warming blanket Monitor closely  3. History of renal transplant on immunosuppressive medications: Continue medications  Prograf level pending  4. Vomiting and diarrhea due to viral gastroenteritis Patient symptoms have improved CT of the abdomen does not show acute abdominal pathology  5. Chronic diastolic CHF: Patient continues to have pleural effusion however is not symptomatic. Appreciate cardiology consult Continue to hold torsemide due to problem #1.  6 acute hypoxic respiratory failure with Recurrent pleural effusion: Patient has had recent thoracentesis. Patient may need repeat thoracentesis if she is symptomatic.  7. PAF: INR is elevated and Coumadin is on hold for now. 8 Diabetes: Continue sliding scale insulin, Lantus and ADA diet  9. Hypothyroid: Continue Synthroid  `0. Depression: Continue Remeron and Lexapro 11. CAD: Continue atorvastatin No aspirin as patient is on Coumadin as an outpatient and no beta blocker due to bradycardia.  12 Diabetes with hypoglycemia; Will d/c Lantusr and start low dose IVF with dextrose     Management plans discussed with the patient and she is in agreement.  CODE STATUS:  FULL  TOTAL TIME TAKING  CARE OF THIS PATIENT: 25 minutes.   D/w nurisng POSSIBLE D/C 2 days, DEPENDING ON CLINICAL CONDITION.   Jhania Etherington M.D on 12/09/2016 at 8:55 AM  Between 7am to 6pm - Pager - [phone] After 6pm go to [email or web address] - password [email or web address] [facility]  Office  [phone]  CC: Primary care physician; Donetta Potts, MD  Note: This dictation was prepared with Dragon dictation along with smaller phrase technology. Any transcriptional errors that result from this process are unintentional.

## 2016-12-09 NOTE — Care Management Important Message (Signed)
Important Message  Patient Details  Name: Elizabeth [name] MRN: [id number] Date of Birth: 17-Mar-1941   Medicare Important Message Given:  Yes    Katrina Stack, RN 12/09/2016, 8:22 AM

## 2016-12-09 NOTE — Progress Notes (Signed)
Subjective:   Patient is ill today.  She is hypothermic, requiring warming blanket Blood sugars are also running low Appetite remains poor Continues to have cough with sputum Serum creatinine has slightly improved to 1.90  Objective:  Vital signs in last 24 hours:  Temp:  [94.3 F (34.6 C)-98.1 F (36.7 C)] 96.6 F (35.9 C) (04/20 1525) Pulse Rate:  [50-128] 128 (04/20 0752) Resp:  [13-14] 13 (04/20 0752) BP: (132-161)/(53-60) 161/60 (04/20 0752) SpO2:  [81 %-98 %] 98 % (04/20 0800) Weight:  [56.5 kg (124 lb 9.6 oz)] 56.5 kg (124 lb 9.6 oz) (04/20 0422)  Weight change: 3.447 kg (7 lb 9.6 oz) Filed Weights   12/07/16 2333 12/08/16 0418 12/09/16 0422  Weight: 56.6 kg (124 lb 11.2 oz) 57 kg (125 lb 9.6 oz) 56.5 kg (124 lb 9.6 oz)    Intake/Output:    Intake/Output Summary (Last 24 hours) at 12/09/16 1658 Last data filed at 12/09/16 1121  Gross per 24 hour  Intake              220 ml  Output              100 ml  Net              120 ml     Physical Exam: General: Chronically ill-appearing, lying in the bed  HEENT Anicteric, moist oral mucous membranes  Neck supple  Pulm/lungs Bibasilar crackles  CVS/Heart Tachycardic, irregular  Abdomen:  Soft, nontender  Extremities: + dependent edema  Neurologic: Alert, able to answer questions  Skin: Scattered ecchymosis          Basic Metabolic Panel:   Recent Labs Lab 12/07/16 1852 12/08/16 0019 12/08/16 0633 12/09/16 0734  NA 135 134* 134* 138  K 6.3* 5.3* 5.2* 5.2*  CL 98* 98* 100* 104  CO2 33* 32 32 31  GLUCOSE 120* 72 84 47*  BUN 63* 60* 56* 46*  CREATININE 2.59* 2.40* 2.21* 1.90*  CALCIUM 9.1 8.6* 8.4* 8.6*     CBC:  Recent Labs Lab 12/07/16 1852 12/08/16 0633  WBC 9.4 4.4  NEUTROABS 8.8*  --   HGB 11.2* 9.6*  HCT 34.0* 29.1*  MCV 93.5 88.3  PLT 199 178      Microbiology:  Recent Results (from the past 720 hour(s))  Susceptibility, Aer + Anaerob     Status: None   Collection Time:  11/22/16 12:00 AM  Result Value Ref Range Status   Suscept, Aer + Anaerob Final report  Corrected    Comment: (NOTE) Performed At: [facility] [place], [place] [id number] Lindon Romp MD [id number] CORRECTED ON 04/13 AT 9622: PREVIOUSLY REPORTED AS Preliminary report    Source of Sample URINE/ ENTEROCOCCUS FAECIUM SUSCEPTIBILITY  Final  Susceptibility Result     Status: None   Collection Time: 11/22/16 12:00 AM  Result Value Ref Range Status   Suscept Result 1 Enterococcus faecium  Final    Comment: (NOTE) Identification performed by account, not confirmed by this laboratory. Ciprofloxacin     >2      Resistant Nitrofurantoin    64      Intermediate Levofloxacin      >4      Resistant Tetracycline      >8      Resistant Rifampin         <=1      Susceptible    Antimicrobial Suscept Comment  Final    Comment: (NOTE)      **  S = Susceptible; I = Intermediate; R = Resistant **                   P = Positive; N = Negative            MICS are expressed in micrograms per mL   Antibiotic                 RSLT#1    RSLT#2    RSLT#3    RSLT#4 Penicillin                     R Vancomycin                     R Performed At: [facility] [place], [place] [id number] Lindon Romp MD CB:[id number]   Blood culture (routine x 2)     Status: None   Collection Time: 11/22/16 12:52 PM  Result Value Ref Range Status   Specimen Description BLOOD RIGHT ANTECUBITAL  Final   Special Requests   Final    BOTTLES DRAWN AEROBIC AND ANAEROBIC Blood Culture adequate volume   Culture NO GROWTH 5 DAYS  Final   Report Status 11/27/2016 FINAL  Final  Blood culture (routine x 2)     Status: None   Collection Time: 11/22/16 12:54 PM  Result Value Ref Range Status   Specimen Description BLOOD RIGHT FOREARM  Final   Special Requests   Final    BOTTLES DRAWN AEROBIC ONLY Blood Culture adequate volume   Culture NO GROWTH 5 DAYS  Final   Report Status  11/27/2016 FINAL  Final  Urine culture     Status: Abnormal   Collection Time: 11/22/16  3:44 PM  Result Value Ref Range Status   Specimen Description URINE, CATHETERIZED  Final   Special Requests NONE  Final   Culture (A)  Final    80,000 COLONIES/mL ENTEROCOCCUS FAECIUM SEE SEPARATE REPORT FOR SUSCEPTIBILITY RESULTS Performed at [facility], READ BACK BY AND VERIFIED WITH: R SAWYER,RN AT 1221 11/26/16 BY L BENFIELD CONCERNING DELAY IN RESULTS    Report Status 12/05/2016 FINAL  Final  Gram stain     Status: None   Collection Time: 11/24/16  1:06 PM  Result Value Ref Range Status   Specimen Description Pleural R  Final   Special Requests NONE  Final   Gram Stain   Final    CYTOSPIN SMEAR WBC PRESENT,BOTH PMN AND MONONUCLEAR NO ORGANISMS SEEN    Report Status 11/24/2016 FINAL  Final  Culture, body fluid-bottle     Status: None   Collection Time: 11/24/16  1:06 PM  Result Value Ref Range Status   Specimen Description PLEURAL  Final   Special Requests NONE  Final   Culture NO GROWTH 5 DAYS  Final   Report Status 11/29/2016 FINAL  Final  MRSA PCR Screening     Status: None   Collection Time: 11/24/16  6:04 PM  Result Value Ref Range Status   MRSA by PCR NEGATIVE NEGATIVE Final    Comment:        The GeneXpert MRSA Assay (FDA approved for NASAL specimens only), is one component of a comprehensive MRSA colonization surveillance program. It is not intended to diagnose MRSA infection nor to guide or monitor treatment for MRSA infections.   Culture, blood (routine x 2)     Status: None (Preliminary result)   Collection Time: 12/07/16  6:40  PM  Result Value Ref Range Status   Specimen Description BLOOD LEFT HAND  Final   Special Requests   Final    BOTTLES DRAWN AEROBIC AND ANAEROBIC Blood Culture results may not be optimal due to an excessive volume of blood received in culture bottles   Culture NO GROWTH 2 DAYS  Final   Report Status PENDING   Incomplete  Culture, blood (routine x 2)     Status: None (Preliminary result)   Collection Time: 12/07/16  6:40 PM  Result Value Ref Range Status   Specimen Description BLOOD  R AC  Final   Special Requests BOTTLES DRAWN AEROBIC AND ANAEROBIC BCAV  Final   Culture  Setup Time   Final    GRAM POSITIVE COCCI AEROBIC BOTTLE ONLY CRITICAL RESULT CALLED TO, READ BACK BY AND VERIFIED WITHSim Boast @ 4098 12/09/16 by [facility] Performed at [facility], [address], [place], [place] [address]    Culture GRAM POSITIVE COCCI  Final   Report Status PENDING  Incomplete  Blood Culture ID Panel (Reflexed)     Status: None   Collection Time: 12/07/16  6:40 PM  Result Value Ref Range Status   Enterococcus species NOT DETECTED NOT DETECTED Final   Listeria monocytogenes NOT DETECTED NOT DETECTED Final   Staphylococcus species NOT DETECTED NOT DETECTED Final   Staphylococcus aureus NOT DETECTED NOT DETECTED Final   Streptococcus species NOT DETECTED NOT DETECTED Final   Streptococcus agalactiae NOT DETECTED NOT DETECTED Final   Streptococcus pneumoniae NOT DETECTED NOT DETECTED Final   Streptococcus pyogenes NOT DETECTED NOT DETECTED Final   Acinetobacter baumannii NOT DETECTED NOT DETECTED Final   Enterobacteriaceae species NOT DETECTED NOT DETECTED Final   Enterobacter cloacae complex NOT DETECTED NOT DETECTED Final   Escherichia coli NOT DETECTED NOT DETECTED Final   Klebsiella oxytoca NOT DETECTED NOT DETECTED Final   Klebsiella pneumoniae NOT DETECTED NOT DETECTED Final   Proteus species NOT DETECTED NOT DETECTED Final   Serratia marcescens NOT DETECTED NOT DETECTED Final   Haemophilus influenzae NOT DETECTED NOT DETECTED Final   Neisseria meningitidis NOT DETECTED NOT DETECTED Final   Pseudomonas aeruginosa NOT DETECTED NOT DETECTED Final   Candida albicans NOT DETECTED NOT DETECTED Final   Candida glabrata NOT DETECTED NOT DETECTED Final   Candida krusei NOT DETECTED NOT DETECTED  Final   Candida parapsilosis NOT DETECTED NOT DETECTED Final   Candida tropicalis NOT DETECTED NOT DETECTED Final    Coagulation Studies:  Recent Labs  12/07/16 1852 12/08/16 0633 12/09/16 0432  LABPROT 51.7* 53.8* 46.2*  INR 5.51* 5.79* 4.79*    Urinalysis:  Recent Labs  12/09/16 0915  COLORURINE YELLOW*  LABSPEC 1.013  PHURINE 5.0  GLUCOSEU 50*  HGBUR NEGATIVE  BILIRUBINUR NEGATIVE  KETONESUR NEGATIVE  PROTEINUR NEGATIVE  NITRITE NEGATIVE  LEUKOCYTESUR NEGATIVE      Imaging: Ct Abdomen Pelvis Wo Contrast  Result Date: 12/07/2016 CLINICAL DATA:  76 year old female with abdominal pain and vomiting for 3 days. EXAM: CT ABDOMEN AND PELVIS WITHOUT CONTRAST TECHNIQUE: Multidetector CT imaging of the abdomen and pelvis was performed following the standard protocol without IV contrast. COMPARISON:  01/06/2013 CT FINDINGS: Please note that parenchymal abnormalities may be missed without intravenous contrast. Lower chest: Bilateral pleural effusions are noted, moderate to large on the right and small on the left. Right lower lung atelectasis is present. Cardiomegaly and heavy coronary artery calcifications are noted. Hepatobiliary: The liver is unremarkable. The patient is status post  cholecystectomy. There is no evidence of biliary dilatation. Pancreas: Unremarkable Spleen: Unremarkable Adrenals/Urinary Tract: Atrophic native kidneys are noted. A transplant kidney within the left pelvis is present with mild fullness of the collecting system again noted. The adrenal glands are unremarkable. Stomach/Bowel: Colonic diverticulosis noted without definite diverticulitis. No definite areas of focal bowel wall thickening noted. There is no evidence of bowel obstruction. Vascular/Lymphatic: Heavy vascular calcifications within the abdomen pelvis are noted. There is no evidence of abdominal aortic aneurysm. No enlarged lymph nodes are identified. Reproductive: Status post hysterectomy. No adnexal  masses. Other: No free fluid, pneumoperitoneum or focal collection. Musculoskeletal: A severe compression fracture of L4 has increased since 11/13/2012 but appears chronic. No definite acute osseous abnormalities noted. IMPRESSION: Follow-up pleural effusions, moderate to large on the right and small on the left with associated moderate to severe right lower lung atelectasis. No evidence of acute abnormality within the abdomen or pelvis. Left pelvic transplant kidney and atrophic native kidneys. Electronically Signed   By: Margarette Canada M.D.   On: 12/07/2016 21:15   Dg Chest 1 View  Result Date: 12/09/2016 CLINICAL DATA:  Follow-up pneumonia EXAM: CHEST 1 VIEW COMPARISON:  12/07/2016 FINDINGS: Cardiac shadow is stable. Postoperative changes are again seen. Bilateral pleural effusions right greater than left are noted. Right basilar infiltrate is again seen and stable. No bony abnormality noted. IMPRESSION: Stable right basilar infiltrate and effusion. Electronically Signed   By: Inez Catalina M.D.   On: 12/09/2016 09:13   Dg Chest Portable 1 View  Result Date: 12/07/2016 CLINICAL DATA:  Acute onset of vomiting. Subacute onset of loose stools. Initial encounter. EXAM: PORTABLE CHEST 1 VIEW COMPARISON:  Chest radiograph performed 11/29/2016 FINDINGS: Moderate right and small left pleural effusions are seen, with associated atelectasis. Mild vascular congestion is noted. No pneumothorax is identified. The cardiomediastinal silhouette is mildly enlarged. Calcification is noted at the heart. The patient is status post median sternotomy. No acute osseous abnormalities are identified. IMPRESSION: Moderate right and small left pleural effusions, with associated atelectasis. Mild vascular congestion and mild cardiomegaly. Electronically Signed   By: Garald Balding M.D.   On: 12/07/2016 18:43     Medications:   . ceFEPIme 1 g (12/09/16 1121)  . dextrose 5 % and 0.45% NaCl 50 mL/hr at 12/09/16 0847   .  atorvastatin  10 mg Oral QODAY  . bacitracin   Topical BID  . escitalopram  10 mg Oral Daily  . feeding supplement (GLUCERNA SHAKE)  237 mL Oral TID BM  . ferrous sulfate  325 mg Oral Daily  . insulin aspart  0-9 Units Subcutaneous TID WC  . levothyroxine  112 mcg Oral QAC breakfast  . mouth rinse  15 mL Mouth Rinse BID  . [START ON 12/10/2016] methylPREDNISolone (SOLU-MEDROL) injection  30 mg Intravenous Once   Followed by  . [START ON 12/11/2016] methylPREDNISolone (SOLU-MEDROL) injection  20 mg Intravenous Once   Followed by  . [START ON 12/12/2016] methylPREDNISolone (SOLU-MEDROL) injection  10 mg Intravenous Once  . mirtazapine  7.5 mg Oral QHS  . mycophenolate  360 mg Oral BID  . pantoprazole  40 mg Oral Daily  . sodium chloride flush  3 mL Intravenous Q12H  . tacrolimus  1 mg Oral QHS  . tacrolimus  2 mg Oral q morning - 10a  . Warfarin - Pharmacist Dosing Inpatient   Does not apply q1800   acetaminophen **OR** acetaminophen, albuterol, HYDROcodone-acetaminophen, hyoscyamine, ondansetron **OR** ondansetron (ZOFRAN) IV, polyethylene glycol  Assessment/  Plan:  76 y.o.caucasian female with medical problems of Coronary disease with CABG in 2001, end-stage renal disease, was on dialysis for 5 years and got deceased donor kidney transplant  in 2001, stroke x3 with last in 2001, type 2 diabetes, Atrial fibrillation, peripheral vascular disease with left BKA, right upper extremity DVT requiring warfarin, who was admitted to [facility] on 12/07/2016 for evaluation of weakness, vomiting and diarrhea. Patient was hospitalized 2 weeks ago for pneumonia.  She was positive for influenza B at that time.  1.  Acute kidney injury  2.  Chronic kidney disease st  3 Baseline creatinine 1.45 from April 12 3.  Renal transplant status. Deceased donor 01-11-00. Hyperkalemia  Patient continues to remain critically ill, hypothermic today, requiring warming blanket.  She is getting treatment for pneumonia with  broad-spectrum IV antics.  Immunosuppression discontinued.  Tacrolimus level is 16, therefore we will reduce the evening dose to 1 mg and repeat level tomorrow morning. Discuss with Dr. Genia Harold to start patient on stress dose steroids for the next few days    LOS: 2 Darlis Wragg 4/20/20184:58 PM

## 2016-12-09 NOTE — Progress Notes (Signed)
Pharmacy Antibiotic Note  Elizabeth [name] is a 76 y.o. female admitted on 12/07/2016 with hypothermia.  Pharmacy has been consulted for cefepime dosing.  Plan: Cefepime 1 gm IV Q24H  Height: 5\' 3"  (160 cm) Weight: 124 lb 9.6 oz (56.5 kg) IBW/kg (Calculated) : 52.4  Temp (24hrs), Avg:97 F (36.1 C), Min:94.3 F (34.6 C), Max:98.1 F (36.7 C)   Recent Labs Lab 12/07/16 1840 12/07/16 1852 12/08/16 0019 12/08/16 0633 12/09/16 0734  WBC  --  9.4  --  4.4  --   CREATININE  --  2.59* 2.40* 2.21* 1.90*  LATICACIDVEN 1.0  --   --   --   --     Estimated Creatinine Clearance: 20.5 mL/min (A) (by C-G formula based on SCr of 1.9 mg/dL (H)).    Allergies  Allergen Reactions  . Azithromycin Other (See Comments)    Upset stomach  . Erythromycin Nausea And Vomiting  . Fentanyl Nausea And Vomiting  . Percocet [Oxycodone-Acetaminophen] Nausea And Vomiting    Thank you for allowing pharmacy to be a part of this patient's care.  Laural Benes, Pharm.D., BCPS Clinical Pharmacist 12/09/2016 9:39 AM

## 2016-12-09 NOTE — Progress Notes (Signed)
PHARMACY - PHYSICIAN COMMUNICATION CRITICAL VALUE ALERT - BLOOD CULTURE IDENTIFICATION (BCID)  No results found for this or any previous visit.   BCID GPC x1 aerobic. No panel microbe detected.  Name of physician (or Provider) ContactedMarcille Blanco  Changes to prescribed antibiotics required: n/a  Khalib Fendley S 12/09/2016  6:52 AM

## 2016-12-09 NOTE — Progress Notes (Signed)
Hypoglycemic Event  CBG: 41  Treatment: 1 amp IV Dextrose  Symptoms: Lethargic, Confused, Cold, Shivering  Follow-up CBG: Time:833 CBG Result:174  Possible Reasons for Event: Poor po intake, patient had received lantus at bedtime  Comments/MD notified:Dr. Benjie Karvonen notified. Lantus was discontinued. D5 1/2NS IV fluids initiated. Temperature was low, warming blanket initiated    Mija Effertz B [employer]

## 2016-12-10 LAB — GLUCOSE, CAPILLARY
GLUCOSE-CAPILLARY: 102 mg/dL — AB (ref 65–99)
Glucose-Capillary: 185 mg/dL — ABNORMAL HIGH (ref 65–99)
Glucose-Capillary: 128 mg/dL — ABNORMAL HIGH (ref 65–99)
Glucose-Capillary: 128 mg/dL — ABNORMAL HIGH (ref 65–99)

## 2016-12-10 LAB — BASIC METABOLIC PANEL
Anion gap: 4 — ABNORMAL LOW (ref 5–15)
BUN: 35 mg/dL — AB (ref 6–20)
CHLORIDE: 103 mmol/L (ref 101–111)
CO2: 28 mmol/L (ref 22–32)
CREATININE: 1.56 mg/dL — AB (ref 0.44–1.00)
Calcium: 8.4 mg/dL — ABNORMAL LOW (ref 8.9–10.3)
GFR calc Af Amer: 36 mL/min — ABNORMAL LOW (ref >60)
GFR calc non Af Amer: 31 mL/min — ABNORMAL LOW (ref >60)
Glucose, Bld: 181 mg/dL — ABNORMAL HIGH (ref 65–99)
Potassium: 5.3 mmol/L — ABNORMAL HIGH (ref 3.5–5.1)
Sodium: 135 mmol/L (ref 135–145)

## 2016-12-10 LAB — PROTIME-INR
INR: 4.34
Prothrombin Time: 42.8 seconds — ABNORMAL HIGH (ref 11.4–15.2)

## 2016-12-10 LAB — CORTISOL: Cortisol, Plasma: 4 ug/dL

## 2016-12-10 LAB — CBC
HCT: 29.2 % — ABNORMAL LOW (ref 35.0–47.0)
Hemoglobin: 9.5 g/dL — ABNORMAL LOW (ref 12.0–16.0)
MCH: 28.7 pg (ref 26.0–34.0)
MCHC: 32.6 g/dL (ref 32.0–36.0)
MCV: 88 fL (ref 80.0–100.0)
PLATELETS: 188 10*3/uL (ref 150–440)
RBC: 3.32 MIL/uL — ABNORMAL LOW (ref 3.80–5.20)
RDW: 16 % — AB (ref 11.5–14.5)
WBC: 3.2 10*3/uL — ABNORMAL LOW (ref 3.6–11.0)

## 2016-12-10 MED ORDER — PANTOPRAZOLE SODIUM 40 MG IV SOLR
40.0000 mg | Freq: Two times a day (BID) | INTRAVENOUS | Status: DC
  Administered 2016-12-10 – 2016-12-15 (×10): 40 mg via INTRAVENOUS
  Filled 2016-12-10 (×10): qty 40

## 2016-12-10 MED ORDER — FLUCONAZOLE IN SODIUM CHLORIDE 200-0.9 MG/100ML-% IV SOLN
200.0000 mg | INTRAVENOUS | Status: DC
  Administered 2016-12-10 – 2016-12-12 (×3): 200 mg via INTRAVENOUS
  Filled 2016-12-10 (×3): qty 100

## 2016-12-10 MED ORDER — DEXTROSE-NACL 5-0.9 % IV SOLN
INTRAVENOUS | Status: DC
  Administered 2016-12-10: 13:00:00 via INTRAVENOUS

## 2016-12-10 MED ORDER — PANTOPRAZOLE SODIUM 40 MG IV SOLR
40.0000 mg | Freq: Two times a day (BID) | INTRAVENOUS | Status: DC
  Filled 2016-12-10: qty 40

## 2016-12-10 NOTE — Progress Notes (Signed)
CRITICAL VALUE ALERT  Critical value received:  INR 4.34  Date of notification:  12/10/2016  Time of notification:  0738  Critical value read back:yes, received from [facility] in Laboratory  Nurse who received alert:  Vicenta Dunning  MD notified (1st page):  Dr. Darvin Neighbours  Time of first page:  [phone]  INR trending down; was 4.79 yesterday. Coumadin is on hold.

## 2016-12-10 NOTE — Consult Note (Signed)
Referring Provider: Dr. Darvin Neighbours Primary Care Physician:  Donetta Potts, MD Primary Gastroenterologist:  Althia Forts  Reason for Consultation:  Dysphagia  HPI: Elizabeth [name] is a 76 y.o. female with multiple medical problems as stated below being seen for a consultation due to dysphagia. She has been having progressive vomiting with eating over the last 3-4 months but for the past 2 weeks she is vomiting everytime she eats. She denies any problems when she swallows each bite but halfway through her meal or after she finishes her meal she will feel like the food hangs up. She then will regurgitate or vomit it back up. Denies odynophagia. Sensation of food hanging up will last about 2 hours. Denies any trouble when she drinks liquids or takes her meds. Denies hematochezia or hematemesis. Black stools on iron pills. Five pound weight loss over the past 2 months. Has been having explosive nonbloody diarrhea for the 1.5 months. Takes Omeprazole daily for GERD. She has recurrent pleural effusions this year. On chronic Coumadin for Afib. EGD in 05/2012 (Dr. Carlean Purl) that was done for odynophagia was normal. History of Candida esophagitis on EGD in 04/2012. Colonoscopy in 2013 showed left-sided diverticulosis, tubular adenoma, and benign left-sided inflammation. Her husband gave most of her history. She falls asleep frequently. Son in room as well.   Past Medical History:  Diagnosis Date  . Adenomatous polyp   . Anemia   . 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 Atrial fibrillation ([place])    a. on Coumadin. No rate-controlling medications with baseline bradycardia.   . Bradycardia   . CAD (coronary artery disease)    a. s/p 1-vessel CABG to LCx in 2001  . Carotid artery stenosis    mild  . Carotid bruit    left  . Chronic kidney disease    s/p cadaveric renal transplant in 2001  . Colitis   .  Compression fracture of L4 lumbar vertebra ([facility])   . Coronary artery disease   . CVA (cerebral infarction)   . Diabetes mellitus   . Diabetic retinopathy   . Esophageal candidiasis ([place])   . Flu 11/22/2016  . Fracture of right maxilla 09/21/2012  . Fx ankle   . Fx wrist   . Gastritis   . GERD (gastroesophageal reflux disease)   . H/O immunosuppressive therapy   . History of orthostatic hypotension   . Hyperlipidemia   . Hyperparathyroidism   . Hypothyroidism   . IBS (irritable bowel syndrome)   . Ischemic colitis ([place])   . Lactose intolerance   . Leg ulcer ([place])    from poor fitting prosthetic  . Melanoma ([place])   . Nephropathy   . 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 Neuropathy   . Osteoporosis    recurrent fractures  . Pancreatic cyst   . Pneumonia 11/2016  . Pulmonary embolism ([place])   . PVD (peripheral vascular disease) ([place])   . Renal artery stenosis ([place])   . 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 Renal osteodystrophy   . Right orbit fracture ([place]) 09/21/2012  . Stroke ([place])   . TBI (traumatic brain injury) ([place]) 09/30/2012  . Tubular adenoma   . Zygoma fracture ([place]) 09/21/2012    Past Surgical History:  Procedure Laterality Date  . ABDOMINAL HYSTERECTOMY    . APPENDECTOMY    . BELOW KNEE LEG AMPUTATION  left, charcott joint from neuropathy  . CATARACT EXTRACTION    . CESAREAN SECTION     x 2  . CHOLECYSTECTOMY    . COLONOSCOPY  01/30/2012   Procedure: COLONOSCOPY;  Surgeon: Milus Banister, MD;  Location: [facility];  Service: Endoscopy;  Laterality: N/A;  . COLONOSCOPY W/ BIOPSIES AND POLYPECTOMY  02/18/2009   adenomatous polyps, diverticulosis, internal hemorrhoids  . CORONARY ARTERY BYPASS GRAFT    . ESOPHAGOGASTRODUODENOSCOPY  05/02/2012   Procedure: ESOPHAGOGASTRODUODENOSCOPY (EGD);  Surgeon: Gatha Mayer, MD;  Location: [facility];  Service: Endoscopy;  Laterality: N/A;  . EUS  02/04/2010   w/FNA, pancreatic cyst  . IR  THORACENTESIS ASP PLEURAL SPACE W/IMG GUIDE  11/24/2016  . KIDNEY TRANSPLANT  08/15/2000   [facility] in [place]  . THORACENTESIS  07/2016, 08/2016, 09/2016   Ordered by Dr. Vella Kohler. Surg: Dr.Green, Dr. Anselm Pancoast  . THYROID SURGERY    . TONSILLECTOMY    . UPPER GASTROINTESTINAL ENDOSCOPY  12/01/2009  . VITRECTOMY     right eye    Prior to Admission medications   Medication Sig Start Date End Date Taking? Authorizing Provider  acetaminophen (TYLENOL) 500 MG tablet Take 1,000 mg by mouth every 6 (six) hours as needed for moderate pain or headache.    Yes Historical Provider, MD  atorvastatin (LIPITOR) 10 MG tablet Take 1 tablet (10 mg total) by mouth every other day. 10/12/12  Yes Daniel J Angiulli, PA-C  cholecalciferol (VITAMIN D) 1000 UNITS tablet Take 2,000 Units by mouth daily.    Yes Historical Provider, MD  docusate sodium (COLACE) 100 MG capsule Take 100 mg by mouth daily as needed for moderate constipation.    Yes Historical Provider, MD  escitalopram (LEXAPRO) 10 MG tablet Take 10 mg by mouth daily.   Yes Historical Provider, MD  ferrous sulfate (SM IRON) 325 (65 FE) MG tablet Take 325 mg by mouth daily.    Yes Historical Provider, MD  HYDROcodone-acetaminophen (NORCO/VICODIN) 5-325 MG per tablet Take 2 tablets by mouth 2 (two) times daily as needed for moderate pain.    Yes Historical Provider, MD  hyoscyamine (ANASPAZ) 0.125 MG TBDP disintergrating tablet Place 0.125 mg under the tongue as needed for cramping.    Yes Historical Provider, MD  insulin glargine (LANTUS) 100 UNIT/ML injection Inject 7 Units into the skin every morning.  10/12/12  Yes Daniel J Angiulli, PA-C  insulin lispro (HUMALOG) 100 UNIT/ML injection Inject 2-9 Units into the skin 4 (four) times daily -  before meals and at bedtime. Per sliding scale.   Yes Historical Provider, MD  levothyroxine (SYNTHROID, LEVOTHROID) 112 MCG tablet Take 112 mcg by mouth daily before breakfast.   Yes Historical Provider, MD  magnesium  gluconate (MAGONATE) 500 MG tablet Take 1,000 mg by mouth daily.    Yes Historical Provider, MD  mirtazapine (REMERON) 7.5 MG tablet Take 7.5 mg by mouth at bedtime.  02/15/13  Yes Historical Provider, MD  Multiple Vitamin (MULTIVITAMIN WITH MINERALS) TABS Take 1 tablet by mouth daily.   Yes Historical Provider, MD  mycophenolate (MYFORTIC) 360 MG TBEC Take 360 mg by mouth 2 (two) times daily.    Yes Historical Provider, MD  nitroGLYCERIN (NITROSTAT) 0.4 MG SL tablet Place 1 tablet (0.4 mg total) under the tongue every 5 (five) minutes as needed for chest pain. 11/02/12  Yes Minna Merritts, MD  omeprazole (PRILOSEC) 20 MG capsule Take 20 mg by mouth at bedtime.   Yes Historical Provider, MD  ondansetron (ZOFRAN) 4 MG tablet Take 4 mg by mouth every 8 (eight) hours as needed for nausea or vomiting.    Yes Historical Provider, MD  potassium chloride (K-DUR) 10 MEQ tablet Take 10 mEq by mouth daily at 12 noon.    Yes Historical Provider, MD  predniSONE (DELTASONE) 5 MG tablet Take 5 mg by mouth every morning.    Yes Historical Provider, MD  promethazine (PHENERGAN) 25 MG tablet Take 1 tablet (25 mg total) by mouth every 6 (six) hours as needed for nausea or vomiting. 11/02/16  Yes Minna Merritts, MD  tacrolimus (PROGRAF) 0.5 MG capsule Take 1-2 mg by mouth See admin instructions. Take 2mg  in the am & 1mg  at night    Yes Historical Provider, MD  torsemide (DEMADEX) 20 MG tablet TAKE ONE TABLET BY MOUTH TWICE DAILY AS NEEDED Patient taking differently: Take 10 mg by mouth at bedtime alternating with 20 mg by mouth at bedtime. 05/23/16  Yes Minna Merritts, MD  warfarin (COUMADIN) 2.5 MG tablet TAKE AS DIRECTED BY COUMADIN CLINIC Patient taking differently: Take 5 mg by mouth daily on Sunday, Tuesday and Thursday. Take 2.5 mg by mouth daily on all other days 11/11/16  Yes Minna Merritts, MD  midodrine (PROAMATINE) 10 MG tablet Take 1 tablet (10 mg total) by mouth 3 (three) times daily as needed. Patient  not taking: Reported on 12/07/2016 06/18/14   Minna Merritts, MD    Scheduled Meds: . atorvastatin  10 mg Oral QODAY  . bacitracin   Topical BID  . escitalopram  10 mg Oral Daily  . feeding supplement (GLUCERNA SHAKE)  237 mL Oral TID BM  . ferrous sulfate  325 mg Oral Daily  . insulin aspart  0-9 Units Subcutaneous TID WC  . levothyroxine  112 mcg Oral QAC breakfast  . mouth rinse  15 mL Mouth Rinse BID  . methylPREDNISolone (SOLU-MEDROL) injection  30 mg Intravenous Once   Followed by  . [START ON 12/11/2016] methylPREDNISolone (SOLU-MEDROL) injection  20 mg Intravenous Once   Followed by  . [START ON 12/12/2016] methylPREDNISolone (SOLU-MEDROL) injection  10 mg Intravenous Once  . mirtazapine  7.5 mg Oral QHS  . mycophenolate  360 mg Oral BID  . pantoprazole  40 mg Oral Daily  . sodium chloride flush  3 mL Intravenous Q12H  . tacrolimus  1 mg Oral BID  . Warfarin - Pharmacist Dosing Inpatient   Does not apply q1800   Continuous Infusions: . ceFEPIme Stopped (12/10/16 1105)   PRN Meds:.acetaminophen **OR** acetaminophen, albuterol, HYDROcodone-acetaminophen, hyoscyamine, ondansetron **OR** ondansetron (ZOFRAN) IV, polyethylene glycol  Allergies as of 12/07/2016 - Review Complete 12/07/2016  Allergen Reaction Noted  . Azithromycin Other (See Comments) 02/08/2016  . Erythromycin Nausea And Vomiting 11/19/2013  . Fentanyl Nausea And Vomiting 11/19/2013  . Percocet [oxycodone-acetaminophen] Nausea And Vomiting 01/27/2012    Family History  Problem Relation Age of Onset  . Stroke Mother 59  . Heart attack Mother   . Heart attack Father 31  . Breast cancer Sister   . Colon cancer Neg Hx   . Diabetes Neg Hx     Social History   Social History  . Marital status: Married    Spouse name: N/A  . Number of children: 2  . Years of education: N/A   Occupational History  . retired Marine scientist Retired   Social History Main Topics  . Smoking status: Never Smoker  . Smokeless  tobacco: Never Used  .  Alcohol use No  . Drug use: No  . Sexual activity: Not on file   Other Topics Concern  . Not on file   Social History Narrative  . No narrative on file    Review of Systems: All negative except as stated above in HPI.  Physical Exam: Vital signs: Vitals:   12/10/16 0735 12/10/16 1055  BP: (!) 164/45   Pulse: 65   Resp: 16   Temp: 98.6 F (37 C) 99.1 F (37.3 C)   Last BM Date: 12/09/16 General:  Lethargic, elderly, frail, no acute distress HEENT: anicteric sclera, dry mouth Lungs:  Clear throughout to auscultation.   No wheezes, crackles, or rhonchi. No acute distress. Heart:  Regular rate and rhythm; no murmurs, clicks, rubs,  or gallops. Abdomen: epigastric tenderness with guarding, otherwise nontender, nondistended, +BS, soft Rectal:  Deferred Ext: right leg no edema; LLE BKA  GI:  Lab Results:  Recent Labs  12/07/16 1852 12/08/16 0633 12/10/16 0617  WBC 9.4 4.4 3.2*  HGB 11.2* 9.6* 9.5*  HCT 34.0* 29.1* 29.2*  PLT 199 178 188   BMET  Recent Labs  12/08/16 0633 12/09/16 0734 12/10/16 0617  NA 134* 138 135  K 5.2* 5.2* 5.3*  CL 100* 104 103  CO2 32 31 28  GLUCOSE 84 47* 181*  BUN 56* 46* 35*  CREATININE 2.21* 1.90* 1.56*  CALCIUM 8.4* 8.6* 8.4*   LFT  Recent Labs  12/07/16 1852  PROT 5.7*  ALBUMIN 3.1*  AST 24  ALT 13*  ALKPHOS 63  BILITOT 0.8   PT/INR  Recent Labs  12/09/16 0432 12/10/16 0617  LABPROT 46.2* 42.8*  INR 4.79* 4.34*     Studies/Results: Dg Chest 1 View  Result Date: 12/09/2016 CLINICAL DATA:  Follow-up pneumonia EXAM: CHEST 1 VIEW COMPARISON:  12/07/2016 FINDINGS: Cardiac shadow is stable. Postoperative changes are again seen. Bilateral pleural effusions right greater than left are noted. Right basilar infiltrate is again seen and stable. No bony abnormality noted. IMPRESSION: Stable right basilar infiltrate and effusion. Electronically Signed   By: Inez Catalina M.D.   On: 12/09/2016 09:13     Impression/Plan: 76 yo with multiple medical problems admitted with acute kidney injury and recurrent vomiting. Having vomiting and regurgitation halfway through her meal or at the end of her meal concerning for a distal esophageal stricture. Achalasia possible but less likely with lack of abnormal findings seen on EGD 5 years ago. Esophageal dysmotility possible. Esophageal candidiasis possible but unlikely to cause the sensation of food sitting in there after she eats. Esophageal ring less likely. She has the sensation of food sitting in her chest right now so will make NPO except ice chips. Will do a barium swallow as the next step. Need INR corrected and likely will need an EGD next week when her INR is less than 2. Start empiric Diflucan. Aspiration precautions. D/W Dr. Darvin Neighbours. Supportive care. Will follow.    LOS: 3 days   [place].  12/10/2016, 12:51 PM

## 2016-12-10 NOTE — Clinical Social Work Note (Signed)
The CSW contacted the patient's husband to discuss PT rec for SNF. The patient's husband declined SNF and reported that they want to continue pursuing HH. RNCM aware. CSW signing off. Please consult should needs arise.  Santiago Bumpers, MSW, Latanya Presser [phone]

## 2016-12-10 NOTE — Progress Notes (Signed)
[facility] [place] NAME: Deshaun Schou    MR#:  [id number]  DATE OF BIRTH:  Oct 25, 1940  SUBJECTIVE:   Has nausea and regurgitation of food. No diarrhea. Mild chronic SOB is same. Fatigued.  Family at bedside  REVIEW OF SYSTEMS:    Review of Systems  Constitutional: Negative for fever, chills,  +weight loss +Tired and weak HENT: Negative for ear pain, nosebleeds, congestion, facial swelling, rhinorrhea, neck pain, neck stiffness and ear discharge.   Respiratory: Negative for cough, shortness of breath, wheezing  Cardiovascular: Negative for chest pain, palpitations and leg swelling.  Gastrointestinal: Negative for heartburn, abdominal pain, vomiting, diarrhea or consitpation Genitourinary: Negative for dysuria, urgency, frequency, hematuria Musculoskeletal: Negative for back pain or joint pain Neurological: Negative for dizziness, seizures, syncope, focal weakness,  numbness and headaches.  Hematological: Does not bruise/bleed easily.  Psychiatric/Behavioral: Negative for hallucinations, confusion, dysphoric mood  DRUG ALLERGIES:   Allergies  Allergen Reactions  . Azithromycin Other (See Comments)    Upset stomach  . Erythromycin Nausea And Vomiting  . Fentanyl Nausea And Vomiting  . Percocet [Oxycodone-Acetaminophen] Nausea And Vomiting    VITALS:  Blood pressure (!) 164/45, pulse 65, temperature 99.1 F (37.3 C), temperature source Rectal, resp. rate 16, height 5\' 3"  (1.6 m), weight 59.2 kg (130 lb 9.6 oz), SpO2 100 %.  PHYSICAL EXAMINATION:  Constitutional: Appears well-developed. No distress. HENT: Normocephalic. Marland Kitchen Oropharynx is clear and moist.  Eyes: Conjunctivae and EOM are normal. PERRLA, no scleral icterus. Mucus membranes dry Neck: Normal ROM. Neck supple. No JVD. No tracheal deviation. CVS: RRR, S1/S2 +, no murmurs, no gallops, no carotid bruit.  Pulmonary: Effort and breath sounds normal, no stridor, rhonchi,  wheezes, rales. Abdominal: Soft. BS +,  no distension, tenderness, rebound or guarding.  Musculoskeletal: Normal range of motion. No edema and no tenderness. Left BKA Neuro: Alert. CN 2-12 grossly intact. No focal deficits. Skin: Skin is warm and dry. No rash noted. Left cheek she has removed skin cancer. Psychiatric: Normal mood and affect.   LABORATORY PANEL:   CBC  Recent Labs Lab 12/10/16 0617  WBC 3.2*  HGB 9.5*  HCT 29.2*  PLT 188   ------------------------------------------------------------------------------------------------------------------  Chemistries   Recent Labs Lab 12/07/16 1852  12/10/16 0617  NA 135  < > 135  K 6.3*  < > 5.3*  CL 98*  < > 103  CO2 33*  < > 28  GLUCOSE 120*  < > 181*  BUN 63*  < > 35*  CREATININE 2.59*  < > 1.56*  CALCIUM 9.1  < > 8.4*  AST 24  --   --   ALT 13*  --   --   ALKPHOS 63  --   --   BILITOT 0.8  --   --   < > = values in this interval not displayed. ------------------------------------------------------------------------------------------------------------------  Cardiac Enzymes  Recent Labs Lab 12/07/16 1852  TROPONINI 0.03*   ------------------------------------------------------------------------------------------------------ RADIOLOGY:  Dg Chest 1 View  Result Date: 12/09/2016 CLINICAL DATA:  Follow-up pneumonia EXAM: CHEST 1 VIEW COMPARISON:  12/07/2016 FINDINGS: Cardiac shadow is stable. Postoperative changes are again seen. Bilateral pleural effusions right greater than left are noted. Right basilar infiltrate is again seen and stable. No bony abnormality noted. IMPRESSION: Stable right basilar infiltrate and effusion. Electronically Signed   By: Inez Catalina M.D.   On: 12/09/2016 09:13   ASSESSMENT AND PLAN:   76 year old female with history of chronic diastolic  heart failure and preserved ejection fraction, kidney transplant in 2001 on immunosuppressant medications, diabetes and PAF who presents with  nausea, vomiting and generalized weakness and found to have acute kidney injury.  * Vomiting likely due to esophageal issues h/o candidiasis. Stricture? Start IV diflucan. Barium swallow. Discussed with Dr. Michail Sermon  * DM with hypoglycemia due to insulin and not eating Stopped levemir On D5 as she is NPO   * Acute kidney injury on chronic kidney disease stage III Creatinine improving and close to baseline  Prograf level elevated and dosing adjusted Appreciate nephrology consultation  * Hypothermia: Possibly from hypoglycemia Resolved  * History of renal transplant on immunosuppressive medications: Continue medications   * Chronic diastolic CHF: Patient continues to have pleural effusion however is not symptomatic. Appreciate cardiology consult Continue to hold torsemide due to AKI  * Acute hypoxic respiratory failure with Recurrent pleural effusion: Patient has had recent thoracentesis. Patient may need repeat thoracentesis if she is symptomatic.  * PAF: INR is elevated and Coumadin is on hold for now. In NSR  * Hypothyroid: Continue Synthroid  * CAD Stable  Management plans discussed with the patient and she is in agreement.  CODE STATUS: FULL  TOTAL TIME TAKING CARE OF THIS PATIENT: 35 minutes.   Hillary Bow R M.D on 12/10/2016 at 1:56 PM  Between 7am to 6pm - Pager - [phone]  After 6pm go to [email or web address] - password [email or web address]  Office  [phone]  CC: Primary care physician; Donetta Potts, MD  Note: This dictation was prepared with Dragon dictation along with smaller phrase technology. Any transcriptional errors that result from this process are unintentional.

## 2016-12-10 NOTE — Progress Notes (Signed)
Subjective:   Patient continues to feel poorly. Blood sugars in body temperature have improved.  Her appetite remains low.  She is not able to keep any food down.  However, she is able to take her medications. Serum creatinine today, has improved to 1.56 Tacrolimus level was high at 16 and therefore the evening dose was adjusted down GI evaluation is pending Patient's husband is at bedside  Objective:  Vital signs in last 24 hours:  Temp:  [96.4 F (35.8 C)-99.1 F (37.3 C)] 99.1 F (37.3 C) (04/21 1055) Pulse Rate:  [53-65] 65 (04/21 0735) Resp:  [14-18] 16 (04/21 0735) BP: (134-164)/(45-73) 164/45 (04/21 0735) SpO2:  [100 %] 100 % (04/21 0735) Weight:  [59.2 kg (130 lb 9.6 oz)] 59.2 kg (130 lb 9.6 oz) (04/21 0444)  Weight change: 2.722 kg (6 lb) Filed Weights   12/08/16 0418 12/09/16 0422 12/10/16 0444  Weight: 57 kg (125 lb 9.6 oz) 56.5 kg (124 lb 9.6 oz) 59.2 kg (130 lb 9.6 oz)    Intake/Output:    Intake/Output Summary (Last 24 hours) at 12/10/16 1303 Last data filed at 12/10/16 1035  Gross per 24 hour  Intake              710 ml  Output                0 ml  Net              710 ml     Physical Exam: General: Chronically ill-appearing, lying in the bed  HEENT Anicteric, moist oral mucous membranes  Neck supple  Pulm/lungs Bibasilar crackles bilaterally  CVS/Heart Tachycardic, irregular  Abdomen:  Soft, nontender  Extremities: + dependent edema  Neurologic: Alert, able to answer questions  Skin: Scattered ecchymosis          Basic Metabolic Panel:   Recent Labs Lab 12/07/16 1852 12/08/16 0019 12/08/16 0633 12/09/16 0734 12/10/16 0617  NA 135 134* 134* 138 135  K 6.3* 5.3* 5.2* 5.2* 5.3*  CL 98* 98* 100* 104 103  CO2 33* 32 32 31 28  GLUCOSE 120* 72 84 47* 181*  BUN 63* 60* 56* 46* 35*  CREATININE 2.59* 2.40* 2.21* 1.90* 1.56*  CALCIUM 9.1 8.6* 8.4* 8.6* 8.4*     CBC:  Recent Labs Lab 12/07/16 1852 12/08/16 0633 12/10/16 0617  WBC  9.4 4.4 3.2*  NEUTROABS 8.8*  --   --   HGB 11.2* 9.6* 9.5*  HCT 34.0* 29.1* 29.2*  MCV 93.5 88.3 88.0  PLT 199 178 188      Microbiology:  Recent Results (from the past 720 hour(s))  Susceptibility, Aer + Anaerob     Status: None   Collection Time: 11/22/16 12:00 AM  Result Value Ref Range Status   Suscept, Aer + Anaerob Final report  Corrected    Comment: (NOTE) Performed At: [facility] [place], [place] [id number] Lindon Romp MD WN:[id number] CORRECTED ON 04/13 AT 0347: PREVIOUSLY REPORTED AS Preliminary report    Source of Sample URINE/ ENTEROCOCCUS FAECIUM SUSCEPTIBILITY  Final  Susceptibility Result     Status: None   Collection Time: 11/22/16 12:00 AM  Result Value Ref Range Status   Suscept Result 1 Enterococcus faecium  Final    Comment: (NOTE) Identification performed by account, not confirmed by this laboratory. Ciprofloxacin     >2      Resistant Nitrofurantoin    64      Intermediate Levofloxacin      >  4      Resistant Tetracycline      >8      Resistant Rifampin         <=1      Susceptible    Antimicrobial Suscept Comment  Final    Comment: (NOTE)      ** S = Susceptible; I = Intermediate; R = Resistant **                   P = Positive; N = Negative            MICS are expressed in micrograms per mL   Antibiotic                 RSLT#1    RSLT#2    RSLT#3    RSLT#4 Penicillin                     R Vancomycin                     R Performed At: [facility] [place], [place] [id number] Lindon Romp MD UE:[id number]   Blood culture (routine x 2)     Status: None   Collection Time: 11/22/16 12:52 PM  Result Value Ref Range Status   Specimen Description BLOOD RIGHT ANTECUBITAL  Final   Special Requests   Final    BOTTLES DRAWN AEROBIC AND ANAEROBIC Blood Culture adequate volume   Culture NO GROWTH 5 DAYS  Final   Report Status 11/27/2016 FINAL  Final  Blood culture (routine x 2)     Status: None    Collection Time: 11/22/16 12:54 PM  Result Value Ref Range Status   Specimen Description BLOOD RIGHT FOREARM  Final   Special Requests   Final    BOTTLES DRAWN AEROBIC ONLY Blood Culture adequate volume   Culture NO GROWTH 5 DAYS  Final   Report Status 11/27/2016 FINAL  Final  Urine culture     Status: Abnormal   Collection Time: 11/22/16  3:44 PM  Result Value Ref Range Status   Specimen Description URINE, CATHETERIZED  Final   Special Requests NONE  Final   Culture (A)  Final    80,000 COLONIES/mL ENTEROCOCCUS FAECIUM SEE SEPARATE REPORT FOR SUSCEPTIBILITY RESULTS Performed at [place], READ BACK BY AND VERIFIED WITH: R SAWYER,RN AT 1221 11/26/16 BY L BENFIELD CONCERNING DELAY IN RESULTS    Report Status 12/05/2016 FINAL  Final  Gram stain     Status: None   Collection Time: 11/24/16  1:06 PM  Result Value Ref Range Status   Specimen Description Pleural R  Final   Special Requests NONE  Final   Gram Stain   Final    CYTOSPIN SMEAR WBC PRESENT,BOTH PMN AND MONONUCLEAR NO ORGANISMS SEEN    Report Status 11/24/2016 FINAL  Final  Culture, body fluid-bottle     Status: None   Collection Time: 11/24/16  1:06 PM  Result Value Ref Range Status   Specimen Description PLEURAL  Final   Special Requests NONE  Final   Culture NO GROWTH 5 DAYS  Final   Report Status 11/29/2016 FINAL  Final  MRSA PCR Screening     Status: None   Collection Time: 11/24/16  6:04 PM  Result Value Ref Range Status   MRSA by PCR NEGATIVE NEGATIVE Final    Comment:        The GeneXpert MRSA Assay (  FDA approved for NASAL specimens only), is one component of a comprehensive MRSA colonization surveillance program. It is not intended to diagnose MRSA infection nor to guide or monitor treatment for MRSA infections.   Culture, blood (routine x 2)     Status: None (Preliminary result)   Collection Time: 12/07/16  6:40 PM  Result Value Ref Range Status   Specimen Description  BLOOD LEFT HAND  Final   Special Requests   Final    BOTTLES DRAWN AEROBIC AND ANAEROBIC Blood Culture results may not be optimal due to an excessive volume of blood received in culture bottles   Culture NO GROWTH 3 DAYS  Final   Report Status PENDING  Incomplete  Culture, blood (routine x 2)     Status: Abnormal (Preliminary result)   Collection Time: 12/07/16  6:40 PM  Result Value Ref Range Status   Specimen Description BLOOD  R AC  Final   Special Requests BOTTLES DRAWN AEROBIC AND ANAEROBIC BCAV  Final   Culture  Setup Time   Final    GRAM POSITIVE COCCI AEROBIC BOTTLE ONLY CRITICAL RESULT CALLED TO, READ BACK BY AND VERIFIED WITH: Matt McBane @ [phone] 12/09/16 by [facility]    Culture (A)  Final    STAPHYLOCOCCUS SPECIES (COAGULASE NEGATIVE) THE SIGNIFICANCE OF ISOLATING THIS ORGANISM FROM A SINGLE SET OF BLOOD CULTURES WHEN MULTIPLE SETS ARE DRAWN IS UNCERTAIN. PLEASE NOTIFY THE MICROBIOLOGY DEPARTMENT WITHIN ONE WEEK IF SPECIATION AND SENSITIVITIES ARE REQUIRED. Performed at [facility], [place] [address], [place], [place] [address]    Report Status PENDING  Incomplete  Blood Culture ID Panel (Reflexed)     Status: None   Collection Time: 12/07/16  6:40 PM  Result Value Ref Range Status   Enterococcus species NOT DETECTED NOT DETECTED Final   Listeria monocytogenes NOT DETECTED NOT DETECTED Final   Staphylococcus species NOT DETECTED NOT DETECTED Final   Staphylococcus aureus NOT DETECTED NOT DETECTED Final   Streptococcus species NOT DETECTED NOT DETECTED Final   Streptococcus agalactiae NOT DETECTED NOT DETECTED Final   Streptococcus pneumoniae NOT DETECTED NOT DETECTED Final   Streptococcus pyogenes NOT DETECTED NOT DETECTED Final   Acinetobacter baumannii NOT DETECTED NOT DETECTED Final   Enterobacteriaceae species NOT DETECTED NOT DETECTED Final   Enterobacter cloacae complex NOT DETECTED NOT DETECTED Final   Escherichia coli NOT DETECTED NOT DETECTED Final   Klebsiella  oxytoca NOT DETECTED NOT DETECTED Final   Klebsiella pneumoniae NOT DETECTED NOT DETECTED Final   Proteus species NOT DETECTED NOT DETECTED Final   Serratia marcescens NOT DETECTED NOT DETECTED Final   Haemophilus influenzae NOT DETECTED NOT DETECTED Final   Neisseria meningitidis NOT DETECTED NOT DETECTED Final   Pseudomonas aeruginosa NOT DETECTED NOT DETECTED Final   Candida albicans NOT DETECTED NOT DETECTED Final   Candida glabrata NOT DETECTED NOT DETECTED Final   Candida krusei NOT DETECTED NOT DETECTED Final   Candida parapsilosis NOT DETECTED NOT DETECTED Final   Candida tropicalis NOT DETECTED NOT DETECTED Final  Culture, blood (Routine X 2) w Reflex to ID Panel     Status: None (Preliminary result)   Collection Time: 12/09/16  9:29 AM  Result Value Ref Range Status   Specimen Description BLOOD RIGHT HAND  Final   Special Requests   Final    BOTTLES DRAWN AEROBIC AND ANAEROBIC Blood Culture adequate volume   Culture NO GROWTH < 24 HOURS  Final   Report Status PENDING  Incomplete  Culture, blood (  Routine X 2) w Reflex to ID Panel     Status: None (Preliminary result)   Collection Time: 12/09/16 11:51 AM  Result Value Ref Range Status   Specimen Description BLOOD RIGHT HAND  Final   Special Requests   Final    BOTTLES DRAWN AEROBIC AND ANAEROBIC Blood Culture adequate volume   Culture NO GROWTH < 24 HOURS  Final   Report Status PENDING  Incomplete    Coagulation Studies:  Recent Labs  12/07/16 1852 12/08/16 0633 12/09/16 0432 12/10/16 0617  LABPROT 51.7* 53.8* 46.2* 42.8*  INR 5.51* 5.79* 4.79* 4.34*    Urinalysis:  Recent Labs  12/09/16 0915  COLORURINE YELLOW*  LABSPEC 1.013  PHURINE 5.0  GLUCOSEU 50*  HGBUR NEGATIVE  BILIRUBINUR NEGATIVE  KETONESUR NEGATIVE  PROTEINUR NEGATIVE  NITRITE NEGATIVE  LEUKOCYTESUR NEGATIVE      Imaging: Dg Chest 1 View  Result Date: 12/09/2016 CLINICAL DATA:  Follow-up pneumonia EXAM: CHEST 1 VIEW COMPARISON:   12/07/2016 FINDINGS: Cardiac shadow is stable. Postoperative changes are again seen. Bilateral pleural effusions right greater than left are noted. Right basilar infiltrate is again seen and stable. No bony abnormality noted. IMPRESSION: Stable right basilar infiltrate and effusion. Electronically Signed   By: Inez Catalina M.D.   On: 12/09/2016 09:13     Medications:   . ceFEPIme Stopped (12/10/16 1105)  . fluconazole (DIFLUCAN) IV     . atorvastatin  10 mg Oral QODAY  . bacitracin   Topical BID  . escitalopram  10 mg Oral Daily  . feeding supplement (GLUCERNA SHAKE)  237 mL Oral TID BM  . ferrous sulfate  325 mg Oral Daily  . insulin aspart  0-9 Units Subcutaneous TID WC  . levothyroxine  112 mcg Oral QAC breakfast  . mouth rinse  15 mL Mouth Rinse BID  . methylPREDNISolone (SOLU-MEDROL) injection  30 mg Intravenous Once   Followed by  . [START ON 12/11/2016] methylPREDNISolone (SOLU-MEDROL) injection  20 mg Intravenous Once   Followed by  . [START ON 12/12/2016] methylPREDNISolone (SOLU-MEDROL) injection  10 mg Intravenous Once  . mirtazapine  7.5 mg Oral QHS  . mycophenolate  360 mg Oral BID  . pantoprazole  40 mg Oral Daily  . sodium chloride flush  3 mL Intravenous Q12H  . tacrolimus  1 mg Oral BID  . Warfarin - Pharmacist Dosing Inpatient   Does not apply q1800   acetaminophen **OR** acetaminophen, albuterol, HYDROcodone-acetaminophen, hyoscyamine, ondansetron **OR** ondansetron (ZOFRAN) IV, polyethylene glycol  Assessment/ Plan:  76 y.o.caucasian female with medical problems of Coronary disease with CABG in 2001, end-stage renal disease, was on dialysis for 5 years and got deceased donor kidney transplant  in 2001, stroke x3 with last in 2001, type 2 diabetes, Atrial fibrillation, peripheral vascular disease with left BKA, right upper extremity DVT requiring warfarin, who was admitted to [facility] on 12/07/2016 for evaluation of weakness, vomiting and diarrhea. Patient was hospitalized  2 weeks ago for pneumonia.  She was positive for influenza B at that time.  1.  Acute kidney injury  2. Chronic kidney disease st  3 Baseline creatinine 1.45 from April 12 3.  Renal transplant status. Deceased donor 11-Jan-2000.  Hyperkalemia    Immunosuppression continued with tacrolimus, mycophenolate, and iv solu-Medrol  tacrolimus dose reduce because of high levels. Repeat level is pending GI evaluation pending for dysphagia Low K Diet- avoid OJ- Apple juice preferred     LOS: 3 Elizabeth [name] 4/21/20181:03 PM

## 2016-12-11 LAB — PROTIME-INR
INR: 3.06
Prothrombin Time: 32.3 seconds — ABNORMAL HIGH (ref 11.4–15.2)

## 2016-12-11 LAB — GLUCOSE, CAPILLARY
GLUCOSE-CAPILLARY: 225 mg/dL — AB (ref 65–99)
GLUCOSE-CAPILLARY: 167 mg/dL — AB (ref 65–99)
GLUCOSE-CAPILLARY: 229 mg/dL — AB (ref 65–99)
Glucose-Capillary: 170 mg/dL — ABNORMAL HIGH (ref 65–99)

## 2016-12-11 LAB — CBC WITH DIFFERENTIAL/PLATELET
Basophils Absolute: 0 10*3/uL (ref 0–0.1)
Basophils Relative: 0 %
EOS ABS: 0 10*3/uL (ref 0–0.7)
EOS PCT: 0 %
HCT: 31.8 % — ABNORMAL LOW (ref 35.0–47.0)
HEMOGLOBIN: 10.5 g/dL — AB (ref 12.0–16.0)
LYMPHS ABS: 0.4 10*3/uL — AB (ref 1.0–3.6)
Lymphocytes Relative: 9 %
MCH: 29.6 pg (ref 26.0–34.0)
MCHC: 33 g/dL (ref 32.0–36.0)
MCV: 89.7 fL (ref 80.0–100.0)
Monocytes Absolute: 0.2 10*3/uL (ref 0.2–0.9)
Monocytes Relative: 4 %
Neutro Abs: 3.6 10*3/uL (ref 1.4–6.5)
Neutrophils Relative %: 87 %
PLATELETS: 185 10*3/uL (ref 150–440)
RBC: 3.54 MIL/uL — ABNORMAL LOW (ref 3.80–5.20)
RDW: 16 % — ABNORMAL HIGH (ref 11.5–14.5)
WBC: 4.1 10*3/uL (ref 3.6–11.0)

## 2016-12-11 LAB — CULTURE, BLOOD (ROUTINE X 2)

## 2016-12-11 LAB — BASIC METABOLIC PANEL
Anion gap: 4 — ABNORMAL LOW (ref 5–15)
BUN: 30 mg/dL — AB (ref 6–20)
CHLORIDE: 101 mmol/L (ref 101–111)
CO2: 29 mmol/L (ref 22–32)
CREATININE: 1.2 mg/dL — AB (ref 0.44–1.00)
Calcium: 8.5 mg/dL — ABNORMAL LOW (ref 8.9–10.3)
GFR calc Af Amer: 49 mL/min — ABNORMAL LOW (ref >60)
GFR calc non Af Amer: 42 mL/min — ABNORMAL LOW (ref >60)
GLUCOSE: 224 mg/dL — AB (ref 65–99)
Potassium: 5.3 mmol/L — ABNORMAL HIGH (ref 3.5–5.1)
Sodium: 134 mmol/L — ABNORMAL LOW (ref 135–145)

## 2016-12-11 LAB — MAGNESIUM: MAGNESIUM: 2.3 mg/dL (ref 1.7–2.4)

## 2016-12-11 MED ORDER — INSULIN ASPART 100 UNIT/ML ~~LOC~~ SOLN
0.0000 [IU] | Freq: Three times a day (TID) | SUBCUTANEOUS | Status: DC
  Administered 2016-12-11: 3 [IU] via SUBCUTANEOUS
  Administered 2016-12-12: 5 [IU] via SUBCUTANEOUS
  Administered 2016-12-12: 2 [IU] via SUBCUTANEOUS
  Administered 2016-12-12: 3 [IU] via SUBCUTANEOUS
  Administered 2016-12-13: 2 [IU] via SUBCUTANEOUS
  Administered 2016-12-13: 7 [IU] via SUBCUTANEOUS
  Administered 2016-12-13: 3 [IU] via SUBCUTANEOUS
  Administered 2016-12-13 – 2016-12-14 (×2): 1 [IU] via SUBCUTANEOUS
  Administered 2016-12-14: 3 [IU] via SUBCUTANEOUS
  Administered 2016-12-14: 2 [IU] via SUBCUTANEOUS
  Administered 2016-12-15 (×2): 1 [IU] via SUBCUTANEOUS
  Administered 2016-12-15: 2 [IU] via SUBCUTANEOUS
  Administered 2016-12-15: 1 [IU] via SUBCUTANEOUS
  Administered 2016-12-16: 3 [IU] via SUBCUTANEOUS
  Administered 2016-12-16: 1 [IU] via SUBCUTANEOUS
  Filled 2016-12-11: qty 3
  Filled 2016-12-11: qty 5
  Filled 2016-12-11 (×2): qty 3
  Filled 2016-12-11 (×4): qty 1
  Filled 2016-12-11 (×2): qty 2
  Filled 2016-12-11: qty 1
  Filled 2016-12-11 (×2): qty 3
  Filled 2016-12-11: qty 1
  Filled 2016-12-11: qty 7
  Filled 2016-12-11: qty 3
  Filled 2016-12-11: qty 2

## 2016-12-11 MED ORDER — TORSEMIDE 20 MG PO TABS
10.0000 mg | ORAL_TABLET | Freq: Every day | ORAL | Status: DC
  Administered 2016-12-11 – 2016-12-15 (×5): 10 mg via ORAL
  Filled 2016-12-11 (×5): qty 1

## 2016-12-11 NOTE — Progress Notes (Signed)
Subjective:   Patient Feels overall somewhat improved today. States she is very hungry this morning but nothing by mouth as recommended by gastroenterologist. Serum creatinine has improved down to 1.2 Cortisol level was extremely low at 4.0  Objective:  Vital signs in last 24 hours:  Temp:  [98.2 F (36.8 C)-98.4 F (36.9 C)] 98.4 F (36.9 C) (04/22 0818) Pulse Rate:  [54-65] 65 (04/22 0818) Resp:  [18-20] 18 (04/22 0818) BP: (159-176)/(44-62) 176/62 (04/22 0818) SpO2:  [95 %-100 %] 95 % (04/22 0818) Weight:  [59.5 kg (131 lb 3.2 oz)] 59.5 kg (131 lb 3.2 oz) (04/22 0500)  Weight change: 0.272 kg (9.6 oz) Filed Weights   12/09/16 0422 12/10/16 0444 12/11/16 0500  Weight: 56.5 kg (124 lb 9.6 oz) 59.2 kg (130 lb 9.6 oz) 59.5 kg (131 lb 3.2 oz)    Intake/Output:    Intake/Output Summary (Last 24 hours) at 12/11/16 1248 Last data filed at 12/10/16 1600  Gross per 24 hour  Intake              150 ml  Output                0 ml  Net              150 ml     Physical Exam: General: Chronically ill-appearing, lying in the bed  HEENT Anicteric, moist oral mucous membranes  Neck supple  Pulm/lungs Mild basilar crackles at left base otherwise clear today   CVS/Heart Tachycardic, irregular  Abdomen:  Soft, nontender  Extremities: + dependent edema  Neurologic: Alert, able to answer questions  Skin: Scattered ecchymosis          Basic Metabolic Panel:   Recent Labs Lab 12/08/16 0019 12/08/16 0633 12/09/16 0734 12/10/16 0617 12/11/16 0625  NA 134* 134* 138 135 134*  K 5.3* 5.2* 5.2* 5.3* 5.3*  CL 98* 100* 104 103 101  CO2 32 32 31 28 29   GLUCOSE 72 84 47* 181* 224*  BUN 60* 56* 46* 35* 30*  CREATININE 2.40* 2.21* 1.90* 1.56* 1.20*  CALCIUM 8.6* 8.4* 8.6* 8.4* 8.5*  MG  --   --   --   --  2.3     CBC:  Recent Labs Lab 12/07/16 1852 12/08/16 0633 12/10/16 0617 12/11/16 0625  WBC 9.4 4.4 3.2* 4.1  NEUTROABS 8.8*  --   --  3.6  HGB 11.2* 9.6* 9.5* 10.5*   HCT 34.0* 29.1* 29.2* 31.8*  MCV 93.5 88.3 88.0 89.7  PLT 199 178 188 185      Microbiology:  Recent Results (from the past 720 hour(s))  Susceptibility, Aer + Anaerob     Status: None   Collection Time: 11/22/16 12:00 AM  Result Value Ref Range Status   Suscept, Aer + Anaerob Final report  Corrected    Comment: (NOTE) Performed At: [facility] [place], [place] [id number] Lindon Romp MD AY:[id number] CORRECTED ON 04/13 AT 3557: PREVIOUSLY REPORTED AS Preliminary report    Source of Sample URINE/ ENTEROCOCCUS FAECIUM SUSCEPTIBILITY  Final  Susceptibility Result     Status: None   Collection Time: 11/22/16 12:00 AM  Result Value Ref Range Status   Suscept Result 1 Enterococcus faecium  Final    Comment: (NOTE) Identification performed by account, not confirmed by this laboratory. Ciprofloxacin     >2      Resistant Nitrofurantoin    64      Intermediate Levofloxacin      >  4      Resistant Tetracycline      >8      Resistant Rifampin         <=1      Susceptible    Antimicrobial Suscept Comment  Final    Comment: (NOTE)      ** S = Susceptible; I = Intermediate; R = Resistant **                   P = Positive; N = Negative            MICS are expressed in micrograms per mL   Antibiotic                 RSLT#1    RSLT#2    RSLT#3    RSLT#4 Penicillin                     R Vancomycin                     R Performed At: [facility] [place], [place] [id number] Lindon Romp MD BO:[id number]   Blood culture (routine x 2)     Status: None   Collection Time: 11/22/16 12:52 PM  Result Value Ref Range Status   Specimen Description BLOOD RIGHT ANTECUBITAL  Final   Special Requests   Final    BOTTLES DRAWN AEROBIC AND ANAEROBIC Blood Culture adequate volume   Culture NO GROWTH 5 DAYS  Final   Report Status 11/27/2016 FINAL  Final  Blood culture (routine x 2)     Status: None   Collection Time: 11/22/16 12:54 PM  Result  Value Ref Range Status   Specimen Description BLOOD RIGHT FOREARM  Final   Special Requests   Final    BOTTLES DRAWN AEROBIC ONLY Blood Culture adequate volume   Culture NO GROWTH 5 DAYS  Final   Report Status 11/27/2016 FINAL  Final  Urine culture     Status: Abnormal   Collection Time: 11/22/16  3:44 PM  Result Value Ref Range Status   Specimen Description URINE, CATHETERIZED  Final   Special Requests NONE  Final   Culture (A)  Final    80,000 COLONIES/mL ENTEROCOCCUS FAECIUM SEE SEPARATE REPORT FOR SUSCEPTIBILITY RESULTS Performed at [place], READ BACK BY AND VERIFIED WITH: R SAWYER,RN AT 1221 11/26/16 BY L BENFIELD CONCERNING DELAY IN RESULTS    Report Status 12/05/2016 FINAL  Final  Gram stain     Status: None   Collection Time: 11/24/16  1:06 PM  Result Value Ref Range Status   Specimen Description Pleural R  Final   Special Requests NONE  Final   Gram Stain   Final    CYTOSPIN SMEAR WBC PRESENT,BOTH PMN AND MONONUCLEAR NO ORGANISMS SEEN    Report Status 11/24/2016 FINAL  Final  Culture, body fluid-bottle     Status: None   Collection Time: 11/24/16  1:06 PM  Result Value Ref Range Status   Specimen Description PLEURAL  Final   Special Requests NONE  Final   Culture NO GROWTH 5 DAYS  Final   Report Status 11/29/2016 FINAL  Final  MRSA PCR Screening     Status: None   Collection Time: 11/24/16  6:04 PM  Result Value Ref Range Status   MRSA by PCR NEGATIVE NEGATIVE Final    Comment:        The GeneXpert MRSA Assay (  FDA approved for NASAL specimens only), is one component of a comprehensive MRSA colonization surveillance program. It is not intended to diagnose MRSA infection nor to guide or monitor treatment for MRSA infections.   Culture, blood (routine x 2)     Status: None (Preliminary result)   Collection Time: 12/07/16  6:40 PM  Result Value Ref Range Status   Specimen Description BLOOD LEFT HAND  Final   Special Requests   Final     BOTTLES DRAWN AEROBIC AND ANAEROBIC Blood Culture results may not be optimal due to an excessive volume of blood received in culture bottles   Culture NO GROWTH 4 DAYS  Final   Report Status PENDING  Incomplete  Culture, blood (routine x 2)     Status: Abnormal   Collection Time: 12/07/16  6:40 PM  Result Value Ref Range Status   Specimen Description BLOOD  R AC  Final   Special Requests BOTTLES DRAWN AEROBIC AND ANAEROBIC BCAV  Final   Culture  Setup Time   Final    GRAM POSITIVE COCCI AEROBIC BOTTLE ONLY CRITICAL RESULT CALLED TO, READ BACK BY AND VERIFIED WITH: Matt McBane @ [phone] 12/09/16 by [facility]    Culture (A)  Final    STAPHYLOCOCCUS SPECIES (COAGULASE NEGATIVE) THE SIGNIFICANCE OF ISOLATING THIS ORGANISM FROM A SINGLE SET OF BLOOD CULTURES WHEN MULTIPLE SETS ARE DRAWN IS UNCERTAIN. PLEASE NOTIFY THE MICROBIOLOGY DEPARTMENT WITHIN ONE WEEK IF SPECIATION AND SENSITIVITIES ARE REQUIRED. Performed at [facility], [place] [address] [place], [place] [address]    Report Status 12/11/2016 FINAL  Final  Blood Culture ID Panel (Reflexed)     Status: None   Collection Time: 12/07/16  6:40 PM  Result Value Ref Range Status   Enterococcus species NOT DETECTED NOT DETECTED Final   Listeria monocytogenes NOT DETECTED NOT DETECTED Final   Staphylococcus species NOT DETECTED NOT DETECTED Final   Staphylococcus aureus NOT DETECTED NOT DETECTED Final   Streptococcus species NOT DETECTED NOT DETECTED Final   Streptococcus agalactiae NOT DETECTED NOT DETECTED Final   Streptococcus pneumoniae NOT DETECTED NOT DETECTED Final   Streptococcus pyogenes NOT DETECTED NOT DETECTED Final   Acinetobacter baumannii NOT DETECTED NOT DETECTED Final   Enterobacteriaceae species NOT DETECTED NOT DETECTED Final   Enterobacter cloacae complex NOT DETECTED NOT DETECTED Final   Escherichia coli NOT DETECTED NOT DETECTED Final   Klebsiella oxytoca NOT DETECTED NOT DETECTED Final   Klebsiella pneumoniae NOT  DETECTED NOT DETECTED Final   Proteus species NOT DETECTED NOT DETECTED Final   Serratia marcescens NOT DETECTED NOT DETECTED Final   Haemophilus influenzae NOT DETECTED NOT DETECTED Final   Neisseria meningitidis NOT DETECTED NOT DETECTED Final   Pseudomonas aeruginosa NOT DETECTED NOT DETECTED Final   Candida albicans NOT DETECTED NOT DETECTED Final   Candida glabrata NOT DETECTED NOT DETECTED Final   Candida krusei NOT DETECTED NOT DETECTED Final   Candida parapsilosis NOT DETECTED NOT DETECTED Final   Candida tropicalis NOT DETECTED NOT DETECTED Final  Culture, blood (Routine X 2) w Reflex to ID Panel     Status: None (Preliminary result)   Collection Time: 12/09/16  9:29 AM  Result Value Ref Range Status   Specimen Description BLOOD RIGHT HAND  Final   Special Requests   Final    BOTTLES DRAWN AEROBIC AND ANAEROBIC Blood Culture adequate volume   Culture NO GROWTH 2 DAYS  Final   Report Status PENDING  Incomplete  Culture, blood (Routine X  2) w Reflex to ID Panel     Status: None (Preliminary result)   Collection Time: 12/09/16 11:51 AM  Result Value Ref Range Status   Specimen Description BLOOD RIGHT HAND  Final   Special Requests   Final    BOTTLES DRAWN AEROBIC AND ANAEROBIC Blood Culture adequate volume   Culture NO GROWTH 2 DAYS  Final   Report Status PENDING  Incomplete    Coagulation Studies:  Recent Labs  12/09/16 0432 12/10/16 0617 12/11/16 0625  LABPROT 46.2* 42.8* 32.3*  INR 4.79* 4.34* 3.06    Urinalysis:  Recent Labs  12/09/16 0915  COLORURINE YELLOW*  LABSPEC 1.013  PHURINE 5.0  GLUCOSEU 50*  HGBUR NEGATIVE  BILIRUBINUR NEGATIVE  KETONESUR NEGATIVE  PROTEINUR NEGATIVE  NITRITE NEGATIVE  LEUKOCYTESUR NEGATIVE      Imaging: No results found.   Medications:   . ceFEPIme 1 g (12/11/16 1222)  . dextrose 5 % and 0.9% NaCl 50 mL/hr at 12/10/16 1323  . fluconazole (DIFLUCAN) IV Stopped (12/10/16 1439)   . atorvastatin  10 mg Oral QODAY   . bacitracin   Topical BID  . escitalopram  10 mg Oral Daily  . feeding supplement (GLUCERNA SHAKE)  237 mL Oral TID BM  . insulin aspart  0-9 Units Subcutaneous TID WC  . levothyroxine  112 mcg Oral QAC breakfast  . mouth rinse  15 mL Mouth Rinse BID  . methylPREDNISolone (SOLU-MEDROL) injection  20 mg Intravenous Once   Followed by  . [START ON 12/12/2016] methylPREDNISolone (SOLU-MEDROL) injection  10 mg Intravenous Once  . mirtazapine  7.5 mg Oral QHS  . mycophenolate  360 mg Oral BID  . pantoprazole (PROTONIX) IV  40 mg Intravenous Q12H  . sodium chloride flush  3 mL Intravenous Q12H  . tacrolimus  1 mg Oral BID  . torsemide  10 mg Oral Daily  . Warfarin - Pharmacist Dosing Inpatient   Does not apply q1800   acetaminophen **OR** acetaminophen, albuterol, HYDROcodone-acetaminophen, hyoscyamine, ondansetron **OR** ondansetron (ZOFRAN) IV, polyethylene glycol  Assessment/ Plan:  76 y.o.caucasian female with medical problems of Coronary disease with CABG in 1999-12-16, end-stage renal disease, was on dialysis for 5 years and got deceased donor kidney transplant  in 1999/12/16, stroke x3 with last in 12-16-1999, type 2 diabetes, Atrial fibrillation, peripheral vascular disease with left BKA, right upper extremity DVT requiring warfarin, who was admitted to [facility] on 12/07/2016 for evaluation of weakness, vomiting and diarrhea. Patient was hospitalized 2 weeks ago for pneumonia.  She was positive for influenza B at that time.  1.  Acute kidney injury  2. Chronic kidney disease st  3 Baseline creatinine 1.45 from April 12 3.  Renal transplant status. Deceased donor 1999/12/16. Followed by Dr Arty Baumgartner in [place] 4.  Hyperkalemia    Immunosuppression continued with tacrolimus, mycophenolate, and iv solu-Medrol  tacrolimus dose reduce because of high levels. Repeat level is pending GI evaluation pending for dysphagia. Barium swallow evaluation planned. Low K Diet- avoid OJ- Apple juice preferred Hypothermia,  low blood sugars improved after her dose of IV Solu-Medrol for adrenal insufficiency. Serum cortisol level extremely low at 4.0 Patient is also getting IV antibiotics for underlying pneumonia  Will follow     LOS: 4 Absalom Aro 4/22/201812:48 PM

## 2016-12-11 NOTE — Progress Notes (Signed)
**Note Elizabeth-Identified via Obfuscation** Gastroenterology Progress Note    Elizabeth [name] 76 y.o. 1941/02/10   Subjective: Hungry. Denies abdominal pain, nausea, or vomiting.  Objective: Vital signs in last 24 hours: Vitals:   12/11/16 0453 12/11/16 0818  BP: (!) 168/57 (!) 176/62  Pulse: 60 65  Resp: 18 18  Temp: 98.2 F (36.8 C) 98.4 F (36.9 C)    Physical Exam: Gen: elderly, lethargic, no acute distress HEENT: anicteric sclera CV: RRR  Chest: CTA B Abd:  Minimal epigastric tenderness with guarding, soft, nondistended, +BS   Lab Results:  Recent Labs  12/10/16 0617 12/11/16 0625  NA 135 134*  K 5.3* 5.3*  CL 103 101  CO2 28 29  GLUCOSE 181* 224*  BUN 35* 30*  CREATININE 1.56* 1.20*  CALCIUM 8.4* 8.5*  MG  --  2.3   No results for input(s): AST, ALT, ALKPHOS, BILITOT, PROT, ALBUMIN in the last 72 hours.  Recent Labs  12/10/16 0617 12/11/16 0625  WBC 3.2* 4.1  NEUTROABS  --  3.6  HGB 9.5* 10.5*  HCT 29.2* 31.8*  MCV 88.0 89.7  PLT 188 185    Recent Labs  12/10/16 0617 12/11/16 0625  LABPROT 42.8* 32.3*  INR 4.34* 3.06      Assessment/Plan: 76 yo with dysphagia on empiric Diflucan.  Question stricture vs esophageal dysmotility. Barium swallow tomorrow and may need an EGD this week pending those results. Coumadin on hold and INR 3. Clear liquid diet today. NPO p MN.   [place] C. 12/11/2016, 11:41 AMPatient ID: Elizabeth [name], female   DOB: 24-Sep-1940, 75 y.o.   MRN: [id number]

## 2016-12-11 NOTE — Progress Notes (Addendum)
[place] for Warfarin  Indication: atrial fibrillation  Allergies  Allergen Reactions  . Azithromycin Other (See Comments)    Upset stomach  . Erythromycin Nausea And Vomiting  . Fentanyl Nausea And Vomiting  . Percocet [Oxycodone-Acetaminophen] Nausea And Vomiting    Patient Measurements: Height: 5\' 3"  (160 cm) Weight: 131 lb 3.2 oz (59.5 kg) IBW/kg (Calculated) : 52.4  Vital Signs: Temp: 98.4 F (36.9 C) (04/22 0818) Temp Source: Oral (04/22 0818) BP: 176/62 (04/22 0818) Pulse Rate: 65 (04/22 0818)  Labs:  Recent Labs  12/09/16 0432 12/09/16 0734 12/10/16 0617 12/11/16 0625  HGB  --   --  9.5* 10.5*  HCT  --   --  29.2* 31.8*  PLT  --   --  188 185  LABPROT 46.2*  --  42.8* 32.3*  INR 4.79*  --  4.34* 3.06  CREATININE  --  1.90* 1.56* 1.20*    Estimated Creatinine Clearance: 32.5 mL/min (A) (by C-G formula based on SCr of 1.2 mg/dL (H)).   Medical History: Past Medical History:  Diagnosis Date  . Adenomatous polyp   . Anemia   . 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 Atrial fibrillation ([place])    a. on Coumadin. No rate-controlling medications with baseline bradycardia.   . Bradycardia   . CAD (coronary artery disease)    a. s/p 1-vessel CABG to LCx in 2001  . Carotid artery stenosis    mild  . Carotid bruit    left  . Chronic kidney disease    s/p cadaveric renal transplant in 2001  . Colitis   . Compression fracture of L4 lumbar vertebra ([facility])   . Coronary artery disease   . CVA (cerebral infarction)   . Diabetes mellitus   . Diabetic retinopathy   . Esophageal candidiasis ([place])   . Flu 11/22/2016  . Fracture of right maxilla 09/21/2012  . Fx ankle   . Fx wrist   . Gastritis   . GERD (gastroesophageal reflux disease)   . H/O immunosuppressive therapy   . History of orthostatic hypotension   . Hyperlipidemia    . Hyperparathyroidism   . Hypothyroidism   . IBS (irritable bowel syndrome)   . Ischemic colitis ([place])   . Lactose intolerance   . Leg ulcer ([place])    from poor fitting prosthetic  . Melanoma ([place])   . Nephropathy   . 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 Neuropathy   . Osteoporosis    recurrent fractures  . Pancreatic cyst   . Pneumonia 11/2016  . Pulmonary embolism ([place])   . PVD (peripheral vascular disease) ([place])   . Renal artery stenosis ([place])   . 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 Renal osteodystrophy   . Right orbit fracture ([place]) 09/21/2012  . Stroke ([place])   . TBI (traumatic brain injury) ([place]) 09/30/2012  . Tubular adenoma   . Zygoma fracture ([place]) 09/21/2012    Assessment: 76 yo female with PMH history of A. Fib.  Pharmacy consulted for warfarin dosing and monitoring. INR supratherapeutic at 5.51 on admission.   Home Regimen: Warfarin 5mg  Sun, Tues, Thurs                             Warfarin 2.5mg  Mon, Weds, Fri, Sat   DATE  INR  DOSE 4/18  5.51  HELD 4/19  5.79  HELD 4/20  4.79  HELD 4/21                 4.34                 HELD 4/22                 3.06                 HELD  Goal of Therapy:  INR 2-3 Monitor platelets by anticoagulation protocol: Yes   Plan:  MD request to hold warfarin until EGD done; need INR <1.6. Will recheck INR with AM labs.   Olivia Canter [facility] Clinical Pharmacist 12/11/2016 8:35 AM

## 2016-12-11 NOTE — Progress Notes (Signed)
[place] at [place] NAME: Elizabeth [name]    MR#:  [id number]  DATE OF BIRTH:  1941/07/09  SUBJECTIVE:   Still has nausea with some regurgitation. No abdominal pain. Diarrhea is resolved. Waiting for barium swallow which is tomorrow morning.  REVIEW OF SYSTEMS:    Review of Systems  Constitutional: Negative for fever, chills,  +weight loss +Tired and weak HENT: Negative for ear pain, nosebleeds, congestion, facial swelling, rhinorrhea, neck pain, neck stiffness and ear discharge.   Respiratory: Negative for cough, shortness of breath, wheezing  Cardiovascular: Negative for chest pain, palpitations and leg swelling.  Gastrointestinal: Negative for heartburn, abdominal pain, vomiting, diarrhea or consitpation Genitourinary: Negative for dysuria, urgency, frequency, hematuria Musculoskeletal: Negative for back pain or joint pain Neurological: Negative for dizziness, seizures, syncope, focal weakness,  numbness and headaches.  Hematological: Does not bruise/bleed easily.  Psychiatric/Behavioral: Negative for hallucinations, confusion, dysphoric mood  DRUG ALLERGIES:   Allergies  Allergen Reactions  . Azithromycin Other (See Comments)    Upset stomach  . Erythromycin Nausea And Vomiting  . Fentanyl Nausea And Vomiting  . Percocet [Oxycodone-Acetaminophen] Nausea And Vomiting    VITALS:  Blood pressure (!) 176/62, pulse 65, temperature 98.4 F (36.9 C), temperature source Oral, resp. rate 18, height 5\' 3"  (1.6 m), weight 59.5 kg (131 lb 3.2 oz), SpO2 95 %.  PHYSICAL EXAMINATION:  Constitutional: Appears well-developed. No distress. HENT: Normocephalic. Marland Kitchen Oropharynx is clear and moist.  Eyes: Conjunctivae and EOM are normal. PERRLA, no scleral icterus. Mucus membranes dry Neck: Normal ROM. Neck supple. No JVD. No tracheal deviation. CVS: RRR, S1/S2 +, no murmurs, no gallops, no carotid bruit.  Pulmonary: Effort and breath sounds  normal, no stridor, rhonchi, wheezes, rales. Abdominal: Soft. BS +,  no distension, tenderness, rebound or guarding.  Musculoskeletal: Normal range of motion. No edema and no tenderness. Left BKA Neuro: Alert. CN 2-12 grossly intact. No focal deficits. Skin: Skin is warm and dry. No rash noted. Left cheek she has removed skin cancer. Psychiatric: Normal mood and affect.   LABORATORY PANEL:   CBC  Recent Labs Lab 12/11/16 0625  WBC 4.1  HGB 10.5*  HCT 31.8*  PLT 185   ------------------------------------------------------------------------------------------------------------------  Chemistries   Recent Labs Lab 12/07/16 1852  12/11/16 0625  NA 135  < > 134*  K 6.3*  < > 5.3*  CL 98*  < > 101  CO2 33*  < > 29  GLUCOSE 120*  < > 224*  BUN 63*  < > 30*  CREATININE 2.59*  < > 1.20*  CALCIUM 9.1  < > 8.5*  MG  --   --  2.3  AST 24  --   --   ALT 13*  --   --   ALKPHOS 63  --   --   BILITOT 0.8  --   --   < > = values in this interval not displayed. ------------------------------------------------------------------------------------------------------------------  Cardiac Enzymes  Recent Labs Lab 12/07/16 1852  TROPONINI 0.03*   ------------------------------------------------------------------------------------------------------ RADIOLOGY:  No results found. ASSESSMENT AND PLAN:   76 year old female with history of chronic diastolic heart failure and preserved ejection fraction, kidney transplant in 2001 on immunosuppressant medications, diabetes and PAF who presents with nausea, vomiting and generalized weakness and found to have acute kidney injury.  * Vomiting likely due to esophageal issues h/o candidiasis. Stricture? Started IV diflucan. Barium swallow tomorrow AM Discussed with Dr. Michail Sermon  * DM with hypoglycemia due to  insulin and not eating Stopped levemir On D5 as she is NPO Improved  * Acute kidney injury on chronic kidney disease stage III -  resolved Creatinine improving and close to baseline  Prograf level elevated and dosing adjusted Appreciate nephrology consultation. Discussed with Dr. Candiss Norse.  * Hypothermia: Possibly from hypoglycemia Resolved  * History of renal transplant on immunosuppressive medications: Continue medications   * Chronic diastolic CHF: Patient continues to have pleural effusion however is not symptomatic. Appreciate cardiology consult Continue to hold torsemide due to AKI  * Acute hypoxic respiratory failure with Recurrent pleural effusion: Patient has had recent thoracentesis. Patient may need repeat thoracentesis if she is symptomatic.  * PAF: INR is elevated and Coumadin is on hold for now. In NSR  * Hypothyroid: Continue Synthroid  * CAD Stable  Management plans discussed with the patient and she is in agreement.  CODE STATUS: FULL  TOTAL TIME TAKING CARE OF THIS PATIENT: 35 minutes.   Hillary Bow R M.D on 12/11/2016 at 12:11 PM  Between 7am to 6pm - Pager - [phone]  After 6pm go to [email or web address] - password [email or web address] [facility]  Office  [phone]  CC: Primary care physician; Donetta Potts, MD  Note: This dictation was prepared with Dragon dictation along with smaller phrase technology. Any transcriptional errors that result from this process are unintentional.

## 2016-12-12 ENCOUNTER — Inpatient Hospital Stay: Payer: Medicare Other

## 2016-12-12 DIAGNOSIS — R1310 Dysphagia, unspecified: Secondary | ICD-10-CM

## 2016-12-12 LAB — CBC WITH DIFFERENTIAL/PLATELET
BASOS PCT: 0 %
Basophils Absolute: 0 10*3/uL (ref 0–0.1)
EOS ABS: 0 10*3/uL (ref 0–0.7)
EOS PCT: 0 %
HCT: 30.3 % — ABNORMAL LOW (ref 35.0–47.0)
Hemoglobin: 9.9 g/dL — ABNORMAL LOW (ref 12.0–16.0)
LYMPHS ABS: 0.3 10*3/uL — AB (ref 1.0–3.6)
Lymphocytes Relative: 9 %
MCH: 29.3 pg (ref 26.0–34.0)
MCHC: 32.6 g/dL (ref 32.0–36.0)
MCV: 90.1 fL (ref 80.0–100.0)
MONO ABS: 0.2 10*3/uL (ref 0.2–0.9)
MONOS PCT: 5 %
Neutro Abs: 3.3 10*3/uL (ref 1.4–6.5)
Neutrophils Relative %: 86 %
Platelets: 181 10*3/uL (ref 150–440)
RBC: 3.36 MIL/uL — ABNORMAL LOW (ref 3.80–5.20)
RDW: 16.4 % — AB (ref 11.5–14.5)
WBC: 3.8 10*3/uL (ref 3.6–11.0)

## 2016-12-12 LAB — CULTURE, BLOOD (ROUTINE X 2): Culture: NO GROWTH

## 2016-12-12 LAB — TACROLIMUS LEVEL
TACROLIMUS (FK506) - LABCORP: 13.4 ng/mL (ref 2.0–20.0)
Tacrolimus (FK506) - LabCorp: 16.2 ng/mL (ref 2.0–20.0)

## 2016-12-12 LAB — BASIC METABOLIC PANEL
Anion gap: 5 (ref 5–15)
BUN: 29 mg/dL — AB (ref 6–20)
CALCIUM: 8.7 mg/dL — AB (ref 8.9–10.3)
CHLORIDE: 103 mmol/L (ref 101–111)
CO2: 30 mmol/L (ref 22–32)
CREATININE: 1.28 mg/dL — AB (ref 0.44–1.00)
GFR calc Af Amer: 46 mL/min — ABNORMAL LOW (ref >60)
GFR calc non Af Amer: 39 mL/min — ABNORMAL LOW (ref >60)
GLUCOSE: 205 mg/dL — AB (ref 65–99)
POTASSIUM: 4.6 mmol/L (ref 3.5–5.1)
SODIUM: 138 mmol/L (ref 135–145)

## 2016-12-12 LAB — GLUCOSE, CAPILLARY
GLUCOSE-CAPILLARY: 157 mg/dL — AB (ref 65–99)
GLUCOSE-CAPILLARY: 244 mg/dL — AB (ref 65–99)
GLUCOSE-CAPILLARY: 137 mg/dL — AB (ref 65–99)
GLUCOSE-CAPILLARY: 253 mg/dL — AB (ref 65–99)
GLUCOSE-CAPILLARY: 233 mg/dL — AB (ref 65–99)
Glucose-Capillary: 155 mg/dL — ABNORMAL HIGH (ref 65–99)
Glucose-Capillary: 111 mg/dL — ABNORMAL HIGH (ref 65–99)

## 2016-12-12 LAB — PROTIME-INR
INR: 2.83
Prothrombin Time: 30.3 seconds — ABNORMAL HIGH (ref 11.4–15.2)

## 2016-12-12 MED ORDER — PREDNISONE 10 MG PO TABS
10.0000 mg | ORAL_TABLET | Freq: Every day | ORAL | Status: DC
  Administered 2016-12-12 – 2016-12-16 (×5): 10 mg via ORAL
  Filled 2016-12-12 (×5): qty 1

## 2016-12-12 MED ORDER — FLUCONAZOLE IN SODIUM CHLORIDE 100-0.9 MG/50ML-% IV SOLN
100.0000 mg | INTRAVENOUS | Status: DC
  Filled 2016-12-12: qty 50

## 2016-12-12 MED ORDER — VITAMIN K1 10 MG/ML IJ SOLN
1.0000 mg | Freq: Once | INTRAMUSCULAR | Status: AC
  Administered 2016-12-12: 1 mg via SUBCUTANEOUS
  Filled 2016-12-12: qty 0.1

## 2016-12-12 NOTE — Care Management (Signed)
Patient and husband wish for her to return home rather than pursue skilled nursing facility.  Patient does have some esophageal motility issues.  Being started on clear liquids today and aspiration precautions with HOB at 45 Degrees .  Patient has hospital bed at home that can accommodate the elevation of head.  Husband will transport her home in private vehicle.  Patient must be able to stand an pivot.  Her brace for her right leg and prosthesis for her left is in her closet.  Have requested home health home visit within 24 hours of discharge.  Will add SLP to services

## 2016-12-12 NOTE — Progress Notes (Signed)
Subjective:  Patient resting comfortably in bed this a.m. BUN currently 29 with a creatinine of 1.28.   Objective:  Vital signs in last 24 hours:  Temp:  [98.3 F (36.8 C)-99.1 F (37.3 C)] 98.5 F (36.9 C) (04/23 1102) Pulse Rate:  [53-65] 53 (04/23 1102) Resp:  [18] 18 (04/23 1102) BP: (122-161)/(32-90) 144/90 (04/23 1102) SpO2:  [96 %-100 %] 99 % (04/23 1102) Weight:  [59.5 kg (131 lb 1.6 oz)] 59.5 kg (131 lb 1.6 oz) (04/23 0420)  Weight change: -0.045 kg (-1.6 oz) Filed Weights   12/10/16 0444 12/11/16 0500 12/12/16 0420  Weight: 59.2 kg (130 lb 9.6 oz) 59.5 kg (131 lb 3.2 oz) 59.5 kg (131 lb 1.6 oz)    Intake/Output:    Intake/Output Summary (Last 24 hours) at 12/12/16 1414 Last data filed at 12/12/16 1027  Gross per 24 hour  Intake              480 ml  Output              350 ml  Net              130 ml     Physical Exam: General: Chronically ill-appearing, lying in the bed  HEENT Anicteric, moist oral mucous membranes  Neck supple  Pulm/lungs Mild basilar crackles at left base  CVS/Heart Tachycardic, irregular  Abdomen:  Soft, nontender  Extremities: + dependent edema  Neurologic: Alert, able to answer questions  Skin: Scattered ecchymosis          Basic Metabolic Panel:   Recent Labs Lab 12/08/16 0633 12/09/16 0734 12/10/16 0617 12/11/16 0625 12/12/16 0527  NA 134* 138 135 134* 138  K 5.2* 5.2* 5.3* 5.3* 4.6  CL 100* 104 103 101 103  CO2 32 31 28 29 30   GLUCOSE 84 47* 181* 224* 205*  BUN 56* 46* 35* 30* 29*  CREATININE 2.21* 1.90* 1.56* 1.20* 1.28*  CALCIUM 8.4* 8.6* 8.4* 8.5* 8.7*  MG  --   --   --  2.3  --      CBC:  Recent Labs Lab 12/07/16 1852 12/08/16 0633 12/10/16 0617 12/11/16 0625 12/12/16 0527  WBC 9.4 4.4 3.2* 4.1 3.8  NEUTROABS 8.8*  --   --  3.6 3.3  HGB 11.2* 9.6* 9.5* 10.5* 9.9*  HCT 34.0* 29.1* 29.2* 31.8* 30.3*  MCV 93.5 88.3 88.0 89.7 90.1  PLT 199 178 188 185 181      Microbiology:  Recent Results  (from the past 720 hour(s))  Susceptibility, Aer + Anaerob     Status: None   Collection Time: 11/22/16 12:00 AM  Result Value Ref Range Status   Suscept, Aer + Anaerob Final report  Corrected    Comment: (NOTE) Performed At: [facility] [place], [place] [id number] Lindon Romp MD EL:[id number] CORRECTED ON 04/13 AT 5852: PREVIOUSLY REPORTED AS Preliminary report    Source of Sample URINE/ ENTEROCOCCUS FAECIUM SUSCEPTIBILITY  Final  Susceptibility Result     Status: None   Collection Time: 11/22/16 12:00 AM  Result Value Ref Range Status   Suscept Result 1 Enterococcus faecium  Final    Comment: (NOTE) Identification performed by account, not confirmed by this laboratory. Ciprofloxacin     >2      Resistant Nitrofurantoin    64      Intermediate Levofloxacin      >4      Resistant Tetracycline      >8  Resistant Rifampin         <=1      Susceptible    Antimicrobial Suscept Comment  Final    Comment: (NOTE)      ** S = Susceptible; I = Intermediate; R = Resistant **                   P = Positive; N = Negative            MICS are expressed in micrograms per mL   Antibiotic                 RSLT#1    RSLT#2    RSLT#3    RSLT#4 Penicillin                     R Vancomycin                     R Performed At: [facility] [place], [place] [id number] Lindon Romp MD FA:[id number]   Blood culture (routine x 2)     Status: None   Collection Time: 11/22/16 12:52 PM  Result Value Ref Range Status   Specimen Description BLOOD RIGHT ANTECUBITAL  Final   Special Requests   Final    BOTTLES DRAWN AEROBIC AND ANAEROBIC Blood Culture adequate volume   Culture NO GROWTH 5 DAYS  Final   Report Status 11/27/2016 FINAL  Final  Blood culture (routine x 2)     Status: None   Collection Time: 11/22/16 12:54 PM  Result Value Ref Range Status   Specimen Description BLOOD RIGHT FOREARM  Final   Special Requests   Final    BOTTLES DRAWN  AEROBIC ONLY Blood Culture adequate volume   Culture NO GROWTH 5 DAYS  Final   Report Status 11/27/2016 FINAL  Final  Urine culture     Status: Abnormal   Collection Time: 11/22/16  3:44 PM  Result Value Ref Range Status   Specimen Description URINE, CATHETERIZED  Final   Special Requests NONE  Final   Culture (A)  Final    80,000 COLONIES/mL ENTEROCOCCUS FAECIUM SEE SEPARATE REPORT FOR SUSCEPTIBILITY RESULTS Performed at [place], READ BACK BY AND VERIFIED WITH: R SAWYER,RN AT 1221 11/26/16 BY L BENFIELD CONCERNING DELAY IN RESULTS    Report Status 12/05/2016 FINAL  Final  Gram stain     Status: None   Collection Time: 11/24/16  1:06 PM  Result Value Ref Range Status   Specimen Description Pleural R  Final   Special Requests NONE  Final   Gram Stain   Final    CYTOSPIN SMEAR WBC PRESENT,BOTH PMN AND MONONUCLEAR NO ORGANISMS SEEN    Report Status 11/24/2016 FINAL  Final  Culture, body fluid-bottle     Status: None   Collection Time: 11/24/16  1:06 PM  Result Value Ref Range Status   Specimen Description PLEURAL  Final   Special Requests NONE  Final   Culture NO GROWTH 5 DAYS  Final   Report Status 11/29/2016 FINAL  Final  MRSA PCR Screening     Status: None   Collection Time: 11/24/16  6:04 PM  Result Value Ref Range Status   MRSA by PCR NEGATIVE NEGATIVE Final    Comment:        The GeneXpert MRSA Assay (FDA approved for NASAL specimens only), is one component of a comprehensive MRSA colonization surveillance program. It is not  intended to diagnose MRSA infection nor to guide or monitor treatment for MRSA infections.   Culture, blood (routine x 2)     Status: None   Collection Time: 12/07/16  6:40 PM  Result Value Ref Range Status   Specimen Description BLOOD LEFT HAND  Final   Special Requests   Final    BOTTLES DRAWN AEROBIC AND ANAEROBIC Blood Culture results may not be optimal due to an excessive volume of blood received in culture  bottles   Culture NO GROWTH 5 DAYS  Final   Report Status 12/12/2016 FINAL  Final  Culture, blood (routine x 2)     Status: Abnormal   Collection Time: 12/07/16  6:40 PM  Result Value Ref Range Status   Specimen Description BLOOD  R AC  Final   Special Requests BOTTLES DRAWN AEROBIC AND ANAEROBIC BCAV  Final   Culture  Setup Time   Final    GRAM POSITIVE COCCI AEROBIC BOTTLE ONLY CRITICAL RESULT CALLED TO, READ BACK BY AND VERIFIED WITH: Matt McBane @ [phone] 12/09/16 by [facility]    Culture (A)  Final    STAPHYLOCOCCUS SPECIES (COAGULASE NEGATIVE) THE SIGNIFICANCE OF ISOLATING THIS ORGANISM FROM A SINGLE SET OF BLOOD CULTURES WHEN MULTIPLE SETS ARE DRAWN IS UNCERTAIN. PLEASE NOTIFY THE MICROBIOLOGY DEPARTMENT WITHIN ONE WEEK IF SPECIATION AND SENSITIVITIES ARE REQUIRED. Performed at [facility], [place] [address] [place], [place] [address]    Report Status 12/11/2016 FINAL  Final  Blood Culture ID Panel (Reflexed)     Status: None   Collection Time: 12/07/16  6:40 PM  Result Value Ref Range Status   Enterococcus species NOT DETECTED NOT DETECTED Final   Listeria monocytogenes NOT DETECTED NOT DETECTED Final   Staphylococcus species NOT DETECTED NOT DETECTED Final   Staphylococcus aureus NOT DETECTED NOT DETECTED Final   Streptococcus species NOT DETECTED NOT DETECTED Final   Streptococcus agalactiae NOT DETECTED NOT DETECTED Final   Streptococcus pneumoniae NOT DETECTED NOT DETECTED Final   Streptococcus pyogenes NOT DETECTED NOT DETECTED Final   Acinetobacter baumannii NOT DETECTED NOT DETECTED Final   Enterobacteriaceae species NOT DETECTED NOT DETECTED Final   Enterobacter cloacae complex NOT DETECTED NOT DETECTED Final   Escherichia coli NOT DETECTED NOT DETECTED Final   Klebsiella oxytoca NOT DETECTED NOT DETECTED Final   Klebsiella pneumoniae NOT DETECTED NOT DETECTED Final   Proteus species NOT DETECTED NOT DETECTED Final   Serratia marcescens NOT DETECTED NOT DETECTED Final    Haemophilus influenzae NOT DETECTED NOT DETECTED Final   Neisseria meningitidis NOT DETECTED NOT DETECTED Final   Pseudomonas aeruginosa NOT DETECTED NOT DETECTED Final   Candida albicans NOT DETECTED NOT DETECTED Final   Candida glabrata NOT DETECTED NOT DETECTED Final   Candida krusei NOT DETECTED NOT DETECTED Final   Candida parapsilosis NOT DETECTED NOT DETECTED Final   Candida tropicalis NOT DETECTED NOT DETECTED Final  Culture, blood (Routine X 2) w Reflex to ID Panel     Status: None (Preliminary result)   Collection Time: 12/09/16  9:29 AM  Result Value Ref Range Status   Specimen Description BLOOD RIGHT HAND  Final   Special Requests   Final    BOTTLES DRAWN AEROBIC AND ANAEROBIC Blood Culture adequate volume   Culture NO GROWTH 3 DAYS  Final   Report Status PENDING  Incomplete  Culture, blood (Routine X 2) w Reflex to ID Panel     Status: None (Preliminary result)   Collection Time: 12/09/16 11:51  AM  Result Value Ref Range Status   Specimen Description BLOOD RIGHT HAND  Final   Special Requests   Final    BOTTLES DRAWN AEROBIC AND ANAEROBIC Blood Culture adequate volume   Culture NO GROWTH 3 DAYS  Final   Report Status PENDING  Incomplete    Coagulation Studies:  Recent Labs  12/10/16 0617 12/11/16 0625 12/12/16 0527  LABPROT 42.8* 32.3* 30.3*  INR 4.34* 3.06 2.83    Urinalysis: No results for input(s): COLORURINE, LABSPEC, PHURINE, GLUCOSEU, HGBUR, BILIRUBINUR, KETONESUR, PROTEINUR, UROBILINOGEN, NITRITE, LEUKOCYTESUR in the last 72 hours.  Invalid input(s): APPERANCEUR    Imaging: Dg Esophagus  Result Date: 12/12/2016 CLINICAL DATA:  Nausea and vomiting. EXAM: ESOPHOGRAM/BARIUM SWALLOW TECHNIQUE: Single contrast examination was performed using  thin. FLUOROSCOPY TIME:  Fluoroscopy Time:  3 minutes 0 seconds. Radiation Exposure Index (if provided by the fluoroscopic device): 28.1 Number of Acquired Spot Images: None COMPARISON:  Chest x-ray 12/09/2016.  FINDINGS: Limited exam due the patient's condition. Patient could only drink a small amount of barium . 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 Esophageal spasm cannot be excluded. No reflux noted. No hiatal hernia noted. Right-sided pleural effusion noted. IMPRESSION: 1. Limited exam due to patient's condition. Patient could only drank a small amount of barium . Tertiary esophageal contractions. 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 Esophageal spasm cannot be excluded. 2. Prominent right pleural effusion . Electronically Signed   By: Marcello Moores  Register   On: 12/12/2016 09:29     Medications:   . fluconazole (DIFLUCAN) IV Stopped (12/12/16 1422)   . atorvastatin  10 mg Oral QODAY  . bacitracin   Topical BID  . escitalopram  10 mg Oral Daily  . feeding supplement (GLUCERNA SHAKE)  237 mL Oral TID BM  . insulin aspart  0-9 Units Subcutaneous TID AC & HS  . levothyroxine  112 mcg Oral QAC breakfast  . mouth rinse  15 mL Mouth Rinse BID  . mirtazapine  7.5 mg Oral QHS  . mycophenolate  360 mg Oral BID  . pantoprazole (PROTONIX) IV  40 mg Intravenous Q12H  . sodium chloride flush  3 mL Intravenous Q12H  . tacrolimus  1 mg Oral BID  . torsemide  10 mg Oral Daily   acetaminophen **OR** acetaminophen, albuterol, HYDROcodone-acetaminophen, hyoscyamine, ondansetron **OR** ondansetron (ZOFRAN) IV, polyethylene glycol  Assessment/ Plan:  76 y.o.caucasian female with medical problems of Coronary disease with CABG in 11-29-99, end-stage renal disease, was on dialysis for 5 years and got deceased donor kidney transplant  in 1999-11-29, stroke x3 with last in Nov 29, 1999, type 2 diabetes, Atrial fibrillation, peripheral vascular disease with left BKA, right upper extremity DVT requiring warfarin, who was admitted to [facility] on 12/07/2016 for evaluation of weakness, vomiting and diarrhea. Patient was hospitalized 2 weeks ago for pneumonia.  She was positive for influenza B at that  time.  1.  Acute kidney injury  2. Chronic kidney disease st  3 Baseline creatinine 1.45 from April 12 3.  Renal transplant status. Deceased donor 29-Nov-1999. Followed by Dr Arty Baumgartner in [place] 4.  Hyperkalemia  Plan:  Renal function appears to have improved since admission.  Continue to monitor renal function trend.  Tacrolimus level still a bit high but hasn't been to decrease tacrolimus any further at the moment as she has improving renal function.  Continue current doses of mycophenolate and tacrolimus.  We will start the patient back on prednisone at 10 mg by mouth daily.  LOS: 5 Elizabeth [name] 4/23/20182:14 PM

## 2016-12-12 NOTE — Progress Notes (Signed)
[facility] for Warfarin  Indication: atrial fibrillation  Allergies  Allergen Reactions  . Azithromycin Other (See Comments)    Upset stomach  . Erythromycin Nausea And Vomiting  . Fentanyl Nausea And Vomiting  . Percocet [Oxycodone-Acetaminophen] Nausea And Vomiting    Patient Measurements: Height: 5\' 3"  (160 cm) Weight: 131 lb 1.6 oz (59.5 kg) IBW/kg (Calculated) : 52.4  Vital Signs: Temp: 99.1 F (37.3 C) (04/23 0420) Temp Source: Oral (04/23 0420) BP: 160/34 (04/23 0420) Pulse Rate: 57 (04/23 0420)  Labs:  Recent Labs  12/10/16 0617 12/11/16 0625 12/12/16 0527  HGB 9.5* 10.5* 9.9*  HCT 29.2* 31.8* 30.3*  PLT 188 185 181  LABPROT 42.8* 32.3* 30.3*  INR 4.34* 3.06 2.83  CREATININE 1.56* 1.20* 1.28*    Estimated Creatinine Clearance: 30.4 mL/min (A) (by C-G formula based on SCr of 1.28 mg/dL (H)).   Medical History: Past Medical History:  Diagnosis Date  . Adenomatous polyp   . Anemia   . 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 Atrial fibrillation ([place])    a. on Coumadin. No rate-controlling medications with baseline bradycardia.   . Bradycardia   . CAD (coronary artery disease)    a. s/p 1-vessel CABG to LCx in 2001  . Carotid artery stenosis    mild  . Carotid bruit    left  . Chronic kidney disease    s/p cadaveric renal transplant in 2001  . Colitis   . Compression fracture of L4 lumbar vertebra ([facility])   . Coronary artery disease   . CVA (cerebral infarction)   . Diabetes mellitus   . Diabetic retinopathy   . Esophageal candidiasis ([place])   . Flu 11/22/2016  . Fracture of right maxilla 09/21/2012  . Fx ankle   . Fx wrist   . Gastritis   . GERD (gastroesophageal reflux disease)   . H/O immunosuppressive therapy   . History of orthostatic hypotension   . Hyperlipidemia   . Hyperparathyroidism   . Hypothyroidism    . IBS (irritable bowel syndrome)   . Ischemic colitis ([place])   . Lactose intolerance   . Leg ulcer ([place])    from poor fitting prosthetic  . Melanoma ([place])   . Nephropathy   . 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 Neuropathy   . Osteoporosis    recurrent fractures  . Pancreatic cyst   . Pneumonia 11/2016  . Pulmonary embolism ([place])   . PVD (peripheral vascular disease) ([place])   . Renal artery stenosis ([place])   . 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 Renal osteodystrophy   . Right orbit fracture ([place]) 09/21/2012  . Stroke ([place])   . TBI (traumatic brain injury) ([place]) 09/30/2012  . Tubular adenoma   . Zygoma fracture ([place]) 09/21/2012    Assessment: 76 yo female with PMH history of A. Fib.  Pharmacy consulted for warfarin dosing and monitoring. INR supratherapeutic at 5.51 on admission.   Home Regimen: Warfarin 5mg  Sun, Tues, Thurs                             Warfarin 2.5mg  [place], Fri, Sat   DATE  INR  DOSE 4/18  5.51  HELD 4/19  5.79  HELD 4/20  4.79  HELD 4/21  4.34                 HELD 4/22                 3.06                 HELD 4/23  2.83  HELD  Goal of Therapy:  INR 2-3 Monitor platelets by anticoagulation protocol: Yes   Plan:  MD request to hold warfarin until EGD done; need INR <1.6. Will recheck INR with AM labs.   Laural Benes, Pharm.D., BCPS Clinical Pharmacist 12/12/2016 7:32 AM

## 2016-12-12 NOTE — Progress Notes (Signed)
Jonathon Bellows MD [address], [place], [place] [address] Phone: [phone] Fax : [phone]  MALIK RUFFINO is being followed for Dysphagia  Day 1 of follow up   Subjective: Feels well , no new complaints, wants to eat and drink    Objective: Vital signs in last 24 hours: Vitals:   12/11/16 1750 12/11/16 1928 12/12/16 0420 12/12/16 0807  BP: (!) 150/41 (!) 122/32 (!) 160/34 (!) 161/67  Pulse: (!) 57 (!) 58 (!) 57 65  Resp:  18 18 18   Temp: 98.5 F (36.9 C) 98.3 F (36.8 C) 99.1 F (37.3 C) 98.6 F (37 C)  TempSrc: Oral Oral Oral Oral  SpO2: 96% 97% 98% 100%  Weight:   131 lb 1.6 oz (59.5 kg)   Height:       Weight change: -1.6 oz (-0.045 kg)  Intake/Output Summary (Last 24 hours) at 12/12/16 1638 Last data filed at 12/12/16 0631  Gross per 24 hour  Intake              720 ml  Output              325 ml  Net              395 ml     Exam:  JVD raised till ear lobe Heart:: G6.K5 present, systolic murmur heart over aortic space, regular rate  Lungs: normal, clear to auscultation and clear to auscultation and percussion Abdomen: soft, nontender, normal bowel sounds   Lab Results: @LABTEST2 @ Micro Results: Recent Results (from the past 240 hour(s))  Culture, blood (routine x 2)     Status: None   Collection Time: 12/07/16  6:40 PM  Result Value Ref Range Status   Specimen Description BLOOD LEFT HAND  Final   Special Requests   Final    BOTTLES DRAWN AEROBIC AND ANAEROBIC Blood Culture results may not be optimal due to an excessive volume of blood received in culture bottles   Culture NO GROWTH 5 DAYS  Final   Report Status 12/12/2016 FINAL  Final  Culture, blood (routine x 2)     Status: Abnormal   Collection Time: 12/07/16  6:40 PM  Result Value Ref Range Status   Specimen Description BLOOD  R AC  Final   Special Requests BOTTLES DRAWN AEROBIC AND ANAEROBIC BCAV  Final   Culture  Setup Time   Final    GRAM POSITIVE COCCI AEROBIC BOTTLE  ONLY CRITICAL RESULT CALLED TO, READ BACK BY AND VERIFIED WITH: Matt McBane @ [id number] 12/09/16 by [facility]    Culture (A)  Final    STAPHYLOCOCCUS SPECIES (COAGULASE NEGATIVE) THE SIGNIFICANCE OF ISOLATING THIS ORGANISM FROM A SINGLE SET OF BLOOD CULTURES WHEN MULTIPLE SETS ARE DRAWN IS UNCERTAIN. PLEASE NOTIFY THE MICROBIOLOGY DEPARTMENT WITHIN ONE WEEK IF SPECIATION AND SENSITIVITIES ARE REQUIRED. Performed at [facility], [place] [address], [place], [place] [address]    Report Status 12/11/2016 FINAL  Final  Blood Culture ID Panel (Reflexed)     Status: None   Collection Time: 12/07/16  6:40 PM  Result Value Ref Range Status   Enterococcus species NOT DETECTED NOT DETECTED Final   Listeria monocytogenes NOT DETECTED NOT DETECTED Final   Staphylococcus species NOT DETECTED NOT DETECTED Final   Staphylococcus aureus NOT DETECTED NOT DETECTED Final   Streptococcus species NOT DETECTED NOT DETECTED Final   Streptococcus agalactiae NOT DETECTED NOT DETECTED Final   Streptococcus pneumoniae NOT DETECTED NOT DETECTED Final  Streptococcus pyogenes NOT DETECTED NOT DETECTED Final   Acinetobacter baumannii NOT DETECTED NOT DETECTED Final   Enterobacteriaceae species NOT DETECTED NOT DETECTED Final   Enterobacter cloacae complex NOT DETECTED NOT DETECTED Final   Escherichia coli NOT DETECTED NOT DETECTED Final   Klebsiella oxytoca NOT DETECTED NOT DETECTED Final   Klebsiella pneumoniae NOT DETECTED NOT DETECTED Final   Proteus species NOT DETECTED NOT DETECTED Final   Serratia marcescens NOT DETECTED NOT DETECTED Final   Haemophilus influenzae NOT DETECTED NOT DETECTED Final   Neisseria meningitidis NOT DETECTED NOT DETECTED Final   Pseudomonas aeruginosa NOT DETECTED NOT DETECTED Final   Candida albicans NOT DETECTED NOT DETECTED Final   Candida glabrata NOT DETECTED NOT DETECTED Final   Candida krusei NOT DETECTED NOT DETECTED Final   Candida parapsilosis NOT DETECTED NOT DETECTED Final    Candida tropicalis NOT DETECTED NOT DETECTED Final  Culture, blood (Routine X 2) w Reflex to ID Panel     Status: None (Preliminary result)   Collection Time: 12/09/16  9:29 AM  Result Value Ref Range Status   Specimen Description BLOOD RIGHT HAND  Final   Special Requests   Final    BOTTLES DRAWN AEROBIC AND ANAEROBIC Blood Culture adequate volume   Culture NO GROWTH 3 DAYS  Final   Report Status PENDING  Incomplete  Culture, blood (Routine X 2) w Reflex to ID Panel     Status: None (Preliminary result)   Collection Time: 12/09/16 11:51 AM  Result Value Ref Range Status   Specimen Description BLOOD RIGHT HAND  Final   Special Requests   Final    BOTTLES DRAWN AEROBIC AND ANAEROBIC Blood Culture adequate volume   Culture NO GROWTH 3 DAYS  Final   Report Status PENDING  Incomplete   Studies/Results: Dg Esophagus  Result Date: 12/12/2016 CLINICAL DATA:  Nausea and vomiting. EXAM: ESOPHOGRAM/BARIUM SWALLOW TECHNIQUE: Single contrast examination was performed using  thin. FLUOROSCOPY TIME:  Fluoroscopy Time:  3 minutes 0 seconds. Radiation Exposure Index (if provided by the fluoroscopic device): 28.1 Number of Acquired Spot Images: None COMPARISON:  Chest x-ray 12/09/2016. FINDINGS: Limited exam due the patient's condition. Patient could only drink a small amount of barium . 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 Esophageal spasm cannot be excluded. No reflux noted. No hiatal hernia noted. Right-sided pleural effusion noted. IMPRESSION: 1. Limited exam due to patient's condition. Patient could only drank a small amount of barium . Tertiary esophageal contractions. 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 Esophageal spasm cannot be excluded. 2. Prominent right pleural effusion . Electronically Signed   By: Marcello Moores  Register   On: 12/12/2016 09:29   Medications: I have reviewed the patient's current medications. Scheduled Meds: . atorvastatin  10 mg Oral  QODAY  . bacitracin   Topical BID  . escitalopram  10 mg Oral Daily  . feeding supplement (GLUCERNA SHAKE)  237 mL Oral TID BM  . insulin aspart  0-9 Units Subcutaneous TID AC & HS  . levothyroxine  112 mcg Oral QAC breakfast  . mouth rinse  15 mL Mouth Rinse BID  . methylPREDNISolone (SOLU-MEDROL) injection  10 mg Intravenous Once  . mirtazapine  7.5 mg Oral QHS  . mycophenolate  360 mg Oral BID  . pantoprazole (PROTONIX) IV  40 mg Intravenous Q12H  . sodium chloride flush  3 mL Intravenous Q12H  . tacrolimus  1 mg Oral BID  . torsemide  10 mg Oral Daily  . Warfarin -  Pharmacist Dosing Inpatient   Does not apply q1800   Continuous Infusions: . ceFEPIme Stopped (12/11/16 1252)  . fluconazole (DIFLUCAN) IV Stopped (12/11/16 1425)   PRN Meds:.acetaminophen **OR** acetaminophen, albuterol, HYDROcodone-acetaminophen, hyoscyamine, ondansetron **OR** ondansetron (ZOFRAN) IV, polyethylene glycol   Assessment: Active Problems:   AKI (acute kidney injury) ([place])  Elizabeth [name] 76 y.o. female being evaluated for dysphagia. Presented for vomiting of 2 weeks duration , diarrhea last 1.5 months . Recurrent pleural effusions , on coumadin for A fibb, H/o candida esophagitis . Barium swallow this morning was a limited exam but showed tertiary contractions , no clear obstruction. She does have a right pleural effusion, pulmonary hypertension, diastolic cardiac dysfunction   Plan: 1. PPI 2. Commence on clears and advance as tolerated. Keep head end of the bed elevated at 45 degrees. If doing well advance to a low fat diet , avoid eating for 2 hours before bed time. She is at a very high risk to perform any endoscopic procedures , if she does not tolerate the diet then will have no choice but to proceed after holding her coumadin(INR 3)  and likely prior thoracocentesis .     LOS: 5 days   Jonathon Bellows 12/12/2016, 9:51 AM

## 2016-12-12 NOTE — Progress Notes (Signed)
[facility] [place] NAME: Elizabeth [name]    MR#:  [id number]  DATE OF BIRTH:  1941/05/28  SUBJECTIVE:   Barium swallow showed presbyesophagus.  Wants to eat.  REVIEW OF SYSTEMS:    Review of Systems  Constitutional: Negative for fever, chills,  +weight loss +Tired and weak HENT: Negative for ear pain, nosebleeds, congestion, facial swelling, rhinorrhea, neck pain, neck stiffness and ear discharge.   Respiratory: Negative for cough, shortness of breath, wheezing  Cardiovascular: Negative for chest pain, palpitations and leg swelling.  Gastrointestinal: Negative for heartburn, abdominal pain, vomiting, diarrhea or consitpation Genitourinary: Negative for dysuria, urgency, frequency, hematuria Musculoskeletal: Negative for back pain or joint pain Neurological: Negative for dizziness, seizures, syncope, focal weakness,  numbness and headaches.  Hematological: Does not bruise/bleed easily.  Psychiatric/Behavioral: Negative for hallucinations, confusion, dysphoric mood  DRUG ALLERGIES:   Allergies  Allergen Reactions  . Azithromycin Other (See Comments)    Upset stomach  . Erythromycin Nausea And Vomiting  . Fentanyl Nausea And Vomiting  . Percocet [Oxycodone-Acetaminophen] Nausea And Vomiting    VITALS:  Blood pressure (!) 144/90, pulse (!) 53, temperature 98.5 F (36.9 C), temperature source Oral, resp. rate 18, height 5\' 3"  (1.6 m), weight 59.5 kg (131 lb 1.6 oz), SpO2 99 %.  PHYSICAL EXAMINATION:  Constitutional: Appears well-developed. No distress. HENT: Normocephalic. Marland Kitchen Oropharynx is clear and moist.  Eyes: Conjunctivae and EOM are normal. PERRLA, no scleral icterus. Mucus membranes dry Neck: Normal ROM. Neck supple. No JVD. No tracheal deviation. CVS: RRR, S1/S2 +, no murmurs, no gallops, no carotid bruit.  Pulmonary: Effort and breath sounds normal, no stridor, rhonchi, wheezes, rales. Abdominal: Soft. BS +,  no  distension, tenderness, rebound or guarding.  Musculoskeletal: Normal range of motion. No edema and no tenderness. Left BKA Neuro: Alert. CN 2-12 grossly intact. No focal deficits. Skin: Skin is warm and dry. No rash noted. Left cheek she has removed skin cancer. Psychiatric: Normal mood and affect.   LABORATORY PANEL:   CBC  Recent Labs Lab 12/12/16 0527  WBC 3.8  HGB 9.9*  HCT 30.3*  PLT 181   ------------------------------------------------------------------------------------------------------------------  Chemistries   Recent Labs Lab 12/07/16 1852  12/11/16 0625 12/12/16 0527  NA 135  < > 134* 138  K 6.3*  < > 5.3* 4.6  CL 98*  < > 101 103  CO2 33*  < > 29 30  GLUCOSE 120*  < > 224* 205*  BUN 63*  < > 30* 29*  CREATININE 2.59*  < > 1.20* 1.28*  CALCIUM 9.1  < > 8.5* 8.7*  MG  --   --  2.3  --   AST 24  --   --   --   ALT 13*  --   --   --   ALKPHOS 63  --   --   --   BILITOT 0.8  --   --   --   < > = values in this interval not displayed. ------------------------------------------------------------------------------------------------------------------  Cardiac Enzymes  Recent Labs Lab 12/07/16 1852  TROPONINI 0.03*   ------------------------------------------------------------------------------------------------------ RADIOLOGY:  Dg Esophagus  Result Date: 12/12/2016 CLINICAL DATA:  Nausea and vomiting. EXAM: ESOPHOGRAM/BARIUM SWALLOW TECHNIQUE: Single contrast examination was performed using  thin. FLUOROSCOPY TIME:  Fluoroscopy Time:  3 minutes 0 seconds. Radiation Exposure Index (if provided by the fluoroscopic device): 28.1 Number of Acquired Spot Images: None COMPARISON:  Chest x-ray 12/09/2016. FINDINGS: Limited exam due the patient's  condition. Patient could only drink a small amount of barium . 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 Esophageal spasm cannot be excluded. No reflux noted. No hiatal hernia noted.  Right-sided pleural effusion noted. IMPRESSION: 1. Limited exam due to patient's condition. Patient could only drank a small amount of barium . Tertiary esophageal contractions. 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 Esophageal spasm cannot be excluded. 2. Prominent right pleural effusion . Electronically Signed   By: Marcello Moores  Register   On: 12/12/2016 09:29   ASSESSMENT AND PLAN:   76 year old female with history of chronic diastolic heart failure and preserved ejection fraction, kidney transplant in 2001 on immunosuppressant medications, diabetes and PAF who presents with nausea, vomiting and generalized weakness and found to have acute kidney injury.  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 On IV diflucan Discussed with Dr. Kathryne Hitch clear liquids. Advance diet as tolerated. EGD if no improvement.Will likely need thoracentesis prior to this.  * DM with hypoglycemia due to insulin and not eating Stopped levemir  * Acute kidney injury on chronic kidney disease stage III - resolved Creatinine improving and close to baseline  Prograf level elevated and dosing adjusted Appreciate nephrology consultation. Discussed with Dr. Candiss Norse.  * Hypothermia: Possibly from hypoglycemia Resolved  * History of renal transplant on immunosuppressive medications: Continue medications   * Chronic diastolic CHF: Patient continues to have pleural effusion however is not symptomatic. Appreciate cardiology consult On low-dose torsemide  * Acute hypoxic respiratory failure with Recurrent pleural effusion: Patient has had recent thoracentesis. Patient may need repeat thoracentesis if she is symptomatic.  * PAF: INR is elevated and Coumadin is on hold for now. In NSR  * Hypothyroid: Continue Synthroid  * CAD Stable  Management plans discussed with the patient and she is in agreement.  CODE STATUS: FULL  TOTAL TIME TAKING CARE OF THIS PATIENT: 35 minutes.   Hillary Bow R M.D  on 12/12/2016 at 11:56 AM  Between 7am to 6pm - Pager - [phone]  After 6pm go to [email or web address] - password [email or web address] [facility]  Office  [phone]  CC: Primary care physician; Donetta Potts, MD  Note: This dictation was prepared with Dragon dictation along with smaller phrase technology. Any transcriptional errors that result from this process are unintentional.

## 2016-12-13 ENCOUNTER — Inpatient Hospital Stay: Payer: Medicare Other

## 2016-12-13 LAB — PROTIME-INR
INR: 1.59
PROTHROMBIN TIME: 19.1 s — AB (ref 11.4–15.2)

## 2016-12-13 LAB — GLUCOSE, CAPILLARY
GLUCOSE-CAPILLARY: 272 mg/dL — AB (ref 65–99)
GLUCOSE-CAPILLARY: 307 mg/dL — AB (ref 65–99)
Glucose-Capillary: 240 mg/dL — ABNORMAL HIGH (ref 65–99)
Glucose-Capillary: 197 mg/dL — ABNORMAL HIGH (ref 65–99)
Glucose-Capillary: 142 mg/dL — ABNORMAL HIGH (ref 65–99)

## 2016-12-13 MED ORDER — SODIUM CHLORIDE 0.9 % IV SOLN
INTRAVENOUS | Status: DC
  Administered 2016-12-13 – 2016-12-14 (×2): via INTRAVENOUS

## 2016-12-13 MED ORDER — FLUCONAZOLE IN SODIUM CHLORIDE 100-0.9 MG/50ML-% IV SOLN
100.0000 mg | INTRAVENOUS | Status: DC
  Administered 2016-12-13: 100 mg via INTRAVENOUS
  Filled 2016-12-13 (×4): qty 50

## 2016-12-13 MED ORDER — ENOXAPARIN SODIUM 40 MG/0.4ML ~~LOC~~ SOLN
40.0000 mg | SUBCUTANEOUS | Status: DC
  Administered 2016-12-13: 40 mg via SUBCUTANEOUS
  Filled 2016-12-13: qty 0.4

## 2016-12-13 NOTE — Progress Notes (Signed)
Update husband about events that happened on the night of 4/23, when the patient became physically aggressive toward staff throughout the night. Patient states that she was attempting to hit and bite staff because she was woken up to be changed. Per pt, " I do not want to be woken up at all when I am asleep". This RN explained the importance/benefits of skin care and the risk associated with leaving skin moist. However patient still refusing to be changed throughout the night. Per husband, [name], patient wears a diaper throughout the night and is changed in the morning. The patient would like to continue this practice while she is here in the hospital. This RN again explained why this is not an ideal practice, but patient adamant about wearing a diaper throughout the night. Husband in agreence with this plan. This RN attempted to compromise with a plan for changing times throughout the night. Patient and husband agree with plan to being changed right before bed, and at 4 am with morning vitals. Will continue to monitor.

## 2016-12-13 NOTE — Care Management Important Message (Signed)
Important Message  Patient Details  Name: Elizabeth [name] MRN: [id number] Date of Birth: 1940-12-21   Medicare Important Message Given:  Yes  Signed IM notice given     Katrina Stack, RN 12/13/2016, 8:17 AM

## 2016-12-13 NOTE — Progress Notes (Signed)
PT Cancellation Note  Patient Details Name: Elizabeth [name] MRN: [id number] DOB: 03/04/1941   Cancelled Treatment:    Reason Eval/Treat Not Completed: Patient at procedure or test/unavailable. Re attempt at a later time/date, as the pt available.    Larae Grooms, PTA 12/13/2016, 3:33 PM

## 2016-12-13 NOTE — Progress Notes (Signed)
Subjective:  No new renal function testing this a.m. Patient resting comfortably in bed at the moment. Patient for EGD tomorrow.  Objective:  Vital signs in last 24 hours:  Temp:  [97.4 F (36.3 C)-98.3 F (36.8 C)] 97.6 F (36.4 C) (04/24 1407) Pulse Rate:  [54-68] 54 (04/24 1407) Resp:  [14-18] 14 (04/24 1407) BP: (129-177)/(30-80) 129/30 (04/24 1407) SpO2:  [92 %-100 %] 93 % (04/24 1407) Weight:  [59 kg (130 lb 1.1 oz)] 59 kg (130 lb 1.1 oz) (04/24 0500)  Weight change: -0.467 kg (-1 lb 0.5 oz) Filed Weights   12/11/16 0500 12/12/16 0420 12/13/16 0500  Weight: 59.5 kg (131 lb 3.2 oz) 59.5 kg (131 lb 1.6 oz) 59 kg (130 lb 1.1 oz)    Intake/Output:    Intake/Output Summary (Last 24 hours) at 12/13/16 1525 Last data filed at 12/13/16 1158  Gross per 24 hour  Intake                0 ml  Output              200 ml  Net             -200 ml     Physical Exam: General: Chronically ill-appearing, lying in the bed  HEENT Anicteric, moist oral mucous membranes  Neck supple  Pulm/lungs Mild basilar crackles at left base  CVS/Heart Irregular, no rubs  Abdomen:  Soft, nontender, BS present  Extremities: + dependent edema  Neurologic: Alert, able to answer questions  Skin: Scattered ecchymoses          Basic Metabolic Panel:   Recent Labs Lab 12/08/16 0633 12/09/16 0734 12/10/16 0617 12/11/16 0625 12/12/16 0527  NA 134* 138 135 134* 138  K 5.2* 5.2* 5.3* 5.3* 4.6  CL 100* 104 103 101 103  CO2 32 31 28 29 30   GLUCOSE 84 47* 181* 224* 205*  BUN 56* 46* 35* 30* 29*  CREATININE 2.21* 1.90* 1.56* 1.20* 1.28*  CALCIUM 8.4* 8.6* 8.4* 8.5* 8.7*  MG  --   --   --  2.3  --      CBC:  Recent Labs Lab 12/07/16 1852 12/08/16 0633 12/10/16 0617 12/11/16 0625 12/12/16 0527  WBC 9.4 4.4 3.2* 4.1 3.8  NEUTROABS 8.8*  --   --  3.6 3.3  HGB 11.2* 9.6* 9.5* 10.5* 9.9*  HCT 34.0* 29.1* 29.2* 31.8* 30.3*  MCV 93.5 88.3 88.0 89.7 90.1  PLT 199 178 188 185 181       Microbiology:  Recent Results (from the past 720 hour(s))  Susceptibility, Aer + Anaerob     Status: None   Collection Time: 11/22/16 12:00 AM  Result Value Ref Range Status   Suscept, Aer + Anaerob Final report  Corrected    Comment: (NOTE) Performed At: [facility] [place], [place] [id number] Lindon Romp MD IR:[id number] CORRECTED ON 04/13 AT 5102: PREVIOUSLY REPORTED AS Preliminary report    Source of Sample URINE/ ENTEROCOCCUS FAECIUM SUSCEPTIBILITY  Final  Susceptibility Result     Status: None   Collection Time: 11/22/16 12:00 AM  Result Value Ref Range Status   Suscept Result 1 Enterococcus faecium  Final    Comment: (NOTE) Identification performed by account, not confirmed by this laboratory. Ciprofloxacin     >2      Resistant Nitrofurantoin    64      Intermediate Levofloxacin      >4  Resistant Tetracycline      >8      Resistant Rifampin         <=1      Susceptible    Antimicrobial Suscept Comment  Final    Comment: (NOTE)      ** S = Susceptible; I = Intermediate; R = Resistant **                   P = Positive; N = Negative            MICS are expressed in micrograms per mL   Antibiotic                 RSLT#1    RSLT#2    RSLT#3    RSLT#4 Penicillin                     R Vancomycin                     R Performed At: [facility] [place], [place] [id number] Lindon Romp MD SF:[id number]   Blood culture (routine x 2)     Status: None   Collection Time: 11/22/16 12:52 PM  Result Value Ref Range Status   Specimen Description BLOOD RIGHT ANTECUBITAL  Final   Special Requests   Final    BOTTLES DRAWN AEROBIC AND ANAEROBIC Blood Culture adequate volume   Culture NO GROWTH 5 DAYS  Final   Report Status 11/27/2016 FINAL  Final  Blood culture (routine x 2)     Status: None   Collection Time: 11/22/16 12:54 PM  Result Value Ref Range Status   Specimen Description BLOOD RIGHT FOREARM  Final    Special Requests   Final    BOTTLES DRAWN AEROBIC ONLY Blood Culture adequate volume   Culture NO GROWTH 5 DAYS  Final   Report Status 11/27/2016 FINAL  Final  Urine culture     Status: Abnormal   Collection Time: 11/22/16  3:44 PM  Result Value Ref Range Status   Specimen Description URINE, CATHETERIZED  Final   Special Requests NONE  Final   Culture (A)  Final    80,000 COLONIES/mL ENTEROCOCCUS FAECIUM SEE SEPARATE REPORT FOR SUSCEPTIBILITY RESULTS Performed at [place], READ BACK BY AND VERIFIED WITH: R SAWYER,RN AT 1221 11/26/16 BY L BENFIELD CONCERNING DELAY IN RESULTS    Report Status 12/05/2016 FINAL  Final  Gram stain     Status: None   Collection Time: 11/24/16  1:06 PM  Result Value Ref Range Status   Specimen Description Pleural R  Final   Special Requests NONE  Final   Gram Stain   Final    CYTOSPIN SMEAR WBC PRESENT,BOTH PMN AND MONONUCLEAR NO ORGANISMS SEEN    Report Status 11/24/2016 FINAL  Final  Culture, body fluid-bottle     Status: None   Collection Time: 11/24/16  1:06 PM  Result Value Ref Range Status   Specimen Description PLEURAL  Final   Special Requests NONE  Final   Culture NO GROWTH 5 DAYS  Final   Report Status 11/29/2016 FINAL  Final  MRSA PCR Screening     Status: None   Collection Time: 11/24/16  6:04 PM  Result Value Ref Range Status   MRSA by PCR NEGATIVE NEGATIVE Final    Comment:        The GeneXpert MRSA Assay (FDA approved for NASAL specimens only),  is one component of a comprehensive MRSA colonization surveillance program. It is not intended to diagnose MRSA infection nor to guide or monitor treatment for MRSA infections.   Culture, blood (routine x 2)     Status: None   Collection Time: 12/07/16  6:40 PM  Result Value Ref Range Status   Specimen Description BLOOD LEFT HAND  Final   Special Requests   Final    BOTTLES DRAWN AEROBIC AND ANAEROBIC Blood Culture results may not be optimal due to an  excessive volume of blood received in culture bottles   Culture NO GROWTH 5 DAYS  Final   Report Status 12/12/2016 FINAL  Final  Culture, blood (routine x 2)     Status: Abnormal   Collection Time: 12/07/16  6:40 PM  Result Value Ref Range Status   Specimen Description BLOOD  R AC  Final   Special Requests BOTTLES DRAWN AEROBIC AND ANAEROBIC BCAV  Final   Culture  Setup Time   Final    GRAM POSITIVE COCCI AEROBIC BOTTLE ONLY CRITICAL RESULT CALLED TO, READ BACK BY AND VERIFIED WITH: Matt McBane @ [phone] 12/09/16 by [facility]    Culture (A)  Final    STAPHYLOCOCCUS SPECIES (COAGULASE NEGATIVE) THE SIGNIFICANCE OF ISOLATING THIS ORGANISM FROM A SINGLE SET OF BLOOD CULTURES WHEN MULTIPLE SETS ARE DRAWN IS UNCERTAIN. PLEASE NOTIFY THE MICROBIOLOGY DEPARTMENT WITHIN ONE WEEK IF SPECIATION AND SENSITIVITIES ARE REQUIRED. Performed at [facility], [place] [address], [place], [place] [address]    Report Status 12/11/2016 FINAL  Final  Blood Culture ID Panel (Reflexed)     Status: None   Collection Time: 12/07/16  6:40 PM  Result Value Ref Range Status   Enterococcus species NOT DETECTED NOT DETECTED Final   Listeria monocytogenes NOT DETECTED NOT DETECTED Final   Staphylococcus species NOT DETECTED NOT DETECTED Final   Staphylococcus aureus NOT DETECTED NOT DETECTED Final   Streptococcus species NOT DETECTED NOT DETECTED Final   Streptococcus agalactiae NOT DETECTED NOT DETECTED Final   Streptococcus pneumoniae NOT DETECTED NOT DETECTED Final   Streptococcus pyogenes NOT DETECTED NOT DETECTED Final   Acinetobacter baumannii NOT DETECTED NOT DETECTED Final   Enterobacteriaceae species NOT DETECTED NOT DETECTED Final   Enterobacter cloacae complex NOT DETECTED NOT DETECTED Final   Escherichia coli NOT DETECTED NOT DETECTED Final   Klebsiella oxytoca NOT DETECTED NOT DETECTED Final   Klebsiella pneumoniae NOT DETECTED NOT DETECTED Final   Proteus species NOT DETECTED NOT DETECTED Final    Serratia marcescens NOT DETECTED NOT DETECTED Final   Haemophilus influenzae NOT DETECTED NOT DETECTED Final   Neisseria meningitidis NOT DETECTED NOT DETECTED Final   Pseudomonas aeruginosa NOT DETECTED NOT DETECTED Final   Candida albicans NOT DETECTED NOT DETECTED Final   Candida glabrata NOT DETECTED NOT DETECTED Final   Candida krusei NOT DETECTED NOT DETECTED Final   Candida parapsilosis NOT DETECTED NOT DETECTED Final   Candida tropicalis NOT DETECTED NOT DETECTED Final  Culture, blood (Routine X 2) w Reflex to ID Panel     Status: None (Preliminary result)   Collection Time: 12/09/16  9:29 AM  Result Value Ref Range Status   Specimen Description BLOOD RIGHT HAND  Final   Special Requests   Final    BOTTLES DRAWN AEROBIC AND ANAEROBIC Blood Culture adequate volume   Culture NO GROWTH 4 DAYS  Final   Report Status PENDING  Incomplete  Culture, blood (Routine X 2) w Reflex to ID Panel  Status: None (Preliminary result)   Collection Time: 12/09/16 11:51 AM  Result Value Ref Range Status   Specimen Description BLOOD RIGHT HAND  Final   Special Requests   Final    BOTTLES DRAWN AEROBIC AND ANAEROBIC Blood Culture adequate volume   Culture NO GROWTH 4 DAYS  Final   Report Status PENDING  Incomplete    Coagulation Studies:  Recent Labs  12/11/16 0625 12/12/16 0527 12/13/16 0540  LABPROT 32.3* 30.3* 19.1*  INR 3.06 2.83 1.59    Urinalysis: No results for input(s): COLORURINE, LABSPEC, PHURINE, GLUCOSEU, HGBUR, BILIRUBINUR, KETONESUR, PROTEINUR, UROBILINOGEN, NITRITE, LEUKOCYTESUR in the last 72 hours.  Invalid input(s): APPERANCEUR    Imaging: Dg Chest 2 View  Result Date: 12/13/2016 CLINICAL DATA:  Confusion with pleural effusion EXAM: CHEST  2 VIEW COMPARISON:  December 09, 2016 FINDINGS: There is a persistent right pleural effusion with consolidation in portions of the right middle and lower lobes. There is a minimal left pleural effusion. The left lung is otherwise  clear. There is cardiomegaly with pulmonary venous hypertension. No adenopathy. There is aortic atherosclerosis. Patient is status post coronary artery bypass grafting. Bones are diffusely osteoporotic. IMPRESSION: Persistent fairly sizable right pleural effusion with consolidation in portions of the right middle and lower lobes. Small left pleural effusion. There is cardiomegaly with pulmonary venous hypertension indicative of pulmonary vascular congestion. There is aortic atherosclerosis. Appearance is essentially stable compared to recent prior study. Electronically Signed   By: Lowella Grip III M.D.   On: 12/13/2016 07:19   Dg Esophagus  Result Date: 12/12/2016 CLINICAL DATA:  Nausea and vomiting. EXAM: ESOPHOGRAM/BARIUM SWALLOW TECHNIQUE: Single contrast examination was performed using  thin. FLUOROSCOPY TIME:  Fluoroscopy Time:  3 minutes 0 seconds. Radiation Exposure Index (if provided by the fluoroscopic device): 28.1 Number of Acquired Spot Images: None COMPARISON:  Chest x-ray 12/09/2016. FINDINGS: Limited exam due the patient's condition. Patient could only drink a small amount of barium . 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 Esophageal spasm cannot be excluded. No reflux noted. No hiatal hernia noted. Right-sided pleural effusion noted. IMPRESSION: 1. Limited exam due to patient's condition. Patient could only drank a small amount of barium . Tertiary esophageal contractions. 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 Esophageal spasm cannot be excluded. 2. Prominent right pleural effusion . Electronically Signed   By: Marcello Moores  Register   On: 12/12/2016 09:29     Medications:   . fluconazole (DIFLUCAN) IV     . atorvastatin  10 mg Oral QODAY  . bacitracin   Topical BID  . enoxaparin (LOVENOX) injection  40 mg Subcutaneous Q24H  . escitalopram  10 mg Oral Daily  . feeding supplement (GLUCERNA SHAKE)  237 mL Oral TID BM  . insulin aspart  0-9  Units Subcutaneous TID AC & HS  . levothyroxine  112 mcg Oral QAC breakfast  . mouth rinse  15 mL Mouth Rinse BID  . mirtazapine  7.5 mg Oral QHS  . mycophenolate  360 mg Oral BID  . pantoprazole (PROTONIX) IV  40 mg Intravenous Q12H  . predniSONE  10 mg Oral Q breakfast  . sodium chloride flush  3 mL Intravenous Q12H  . tacrolimus  1 mg Oral BID  . torsemide  10 mg Oral Daily   acetaminophen **OR** acetaminophen, albuterol, HYDROcodone-acetaminophen, hyoscyamine, ondansetron **OR** ondansetron (ZOFRAN) IV, polyethylene glycol  Assessment/ Plan:  76 y.o.caucasian female with medical problems of Coronary disease with CABG in 2001, end-stage renal disease, was  on dialysis for 5 years and got deceased donor kidney transplant  in 12/18/99, stroke x3 with last in December 18, 1999, type 2 diabetes, Atrial fibrillation, peripheral vascular disease with left BKA, right upper extremity DVT requiring warfarin, who was admitted to [facility] on 12/07/2016 for evaluation of weakness, vomiting and diarrhea. Patient was hospitalized 2 weeks ago for pneumonia.  She was positive for influenza B at that time.  1.  Acute kidney injury  2. Chronic kidney disease st  3 Baseline creatinine 1.45 from April 12 3.  Renal transplant status. Deceased donor 12-18-1999. Followed by Dr Arty Baumgartner in [place] 4.  Hyperkalemia  Plan:  Overall from the perspective of her renal cancer and status she appears to be doing well.  Renal function has been improved since admission.  Continue to periodically monitor renal function.  Initial tacrolimus level was a bit high however dose of tacrolimus was reduced to 1 mg by mouth twice a day.  Continue current doses of prednisone as well as mycophenolate.  Patient for EGD tomorrow for dysphagia.     LOS: 6 Sharran Caratachea 4/24/20183:25 PM

## 2016-12-13 NOTE — Progress Notes (Signed)
Jonathon Bellows MD [address] [place], [place] [address] Phone: [phone] Fax : [phone]  Elizabeth [name] is being followed for dysphagia  Day 2 of follow up   Subjective: Still unable to keep foods down , throwing up    Objective: Vital signs in last 24 hours: Vitals:   12/13/16 0500 12/13/16 0827 12/13/16 1407 12/13/16 1527  BP:  (!) 153/48 (!) 129/30 (!) 148/135  Pulse:  63 (!) 54   Resp:  18 14 16   Temp:  97.5 F (36.4 C) 97.6 F (36.4 C)   TempSrc:  Oral    SpO2:  100% 93% 95%  Weight: 130 lb 1.1 oz (59 kg)     Height:       Weight change: -1 lb 0.5 oz (-0.467 kg)  Intake/Output Summary (Last 24 hours) at 12/13/16 1538 Last data filed at 12/13/16 1158  Gross per 24 hour  Intake                0 ml  Output              200 ml  Net             -200 ml     Exam: Heart:: Regular rate and rhythm, S1S2 present or without murmur or extra heart sounds Lungs: air entery b/l equal and decreased at the bases Abdomen: soft, nontender, normal bowel sounds   Lab Results: CBC Latest Ref Rng & Units 12/12/2016 12/11/2016 12/10/2016  WBC 3.6 - 11.0 K/uL 3.8 4.1 3.2(L)  Hemoglobin 12.0 - 16.0 g/dL 9.9(L) 10.5(L) 9.5(L)  Hematocrit 35.0 - 47.0 % 30.3(L) 31.8(L) 29.2(L)  Platelets 150 - 440 K/uL 181 185 188    Micro Results: Recent Results (from the past 240 hour(s))  Culture, blood (routine x 2)     Status: None   Collection Time: 12/07/16  6:40 PM  Result Value Ref Range Status   Specimen Description BLOOD LEFT HAND  Final   Special Requests   Final    BOTTLES DRAWN AEROBIC AND ANAEROBIC Blood Culture results may not be optimal due to an excessive volume of blood received in culture bottles   Culture NO GROWTH 5 DAYS  Final   Report Status 12/12/2016 FINAL  Final  Culture, blood (routine x 2)     Status: Abnormal   Collection Time: 12/07/16  6:40 PM  Result Value Ref Range Status   Specimen Description BLOOD  R AC  Final   Special Requests BOTTLES  DRAWN AEROBIC AND ANAEROBIC BCAV  Final   Culture  Setup Time   Final    GRAM POSITIVE COCCI AEROBIC BOTTLE ONLY CRITICAL RESULT CALLED TO, READ BACK BY AND VERIFIED WITH: Matt McBane @ [phone] 12/09/16 by [facility]    Culture (A)  Final    STAPHYLOCOCCUS SPECIES (COAGULASE NEGATIVE) THE SIGNIFICANCE OF ISOLATING THIS ORGANISM FROM A SINGLE SET OF BLOOD CULTURES WHEN MULTIPLE SETS ARE DRAWN IS UNCERTAIN. PLEASE NOTIFY THE MICROBIOLOGY DEPARTMENT WITHIN ONE WEEK IF SPECIATION AND SENSITIVITIES ARE REQUIRED. Performed at [facility], [place] [address] [place], [place] [address]    Report Status 12/11/2016 FINAL  Final  Blood Culture ID Panel (Reflexed)     Status: None   Collection Time: 12/07/16  6:40 PM  Result Value Ref Range Status   Enterococcus species NOT DETECTED NOT DETECTED Final   Listeria monocytogenes NOT DETECTED NOT DETECTED Final   Staphylococcus species NOT DETECTED NOT DETECTED Final   Staphylococcus  aureus NOT DETECTED NOT DETECTED Final   Streptococcus species NOT DETECTED NOT DETECTED Final   Streptococcus agalactiae NOT DETECTED NOT DETECTED Final   Streptococcus pneumoniae NOT DETECTED NOT DETECTED Final   Streptococcus pyogenes NOT DETECTED NOT DETECTED Final   Acinetobacter baumannii NOT DETECTED NOT DETECTED Final   Enterobacteriaceae species NOT DETECTED NOT DETECTED Final   Enterobacter cloacae complex NOT DETECTED NOT DETECTED Final   Escherichia coli NOT DETECTED NOT DETECTED Final   Klebsiella oxytoca NOT DETECTED NOT DETECTED Final   Klebsiella pneumoniae NOT DETECTED NOT DETECTED Final   Proteus species NOT DETECTED NOT DETECTED Final   Serratia marcescens NOT DETECTED NOT DETECTED Final   Haemophilus influenzae NOT DETECTED NOT DETECTED Final   Neisseria meningitidis NOT DETECTED NOT DETECTED Final   Pseudomonas aeruginosa NOT DETECTED NOT DETECTED Final   Candida albicans NOT DETECTED NOT DETECTED Final   Candida glabrata NOT DETECTED NOT DETECTED Final    Candida krusei NOT DETECTED NOT DETECTED Final   Candida parapsilosis NOT DETECTED NOT DETECTED Final   Candida tropicalis NOT DETECTED NOT DETECTED Final  Culture, blood (Routine X 2) w Reflex to ID Panel     Status: None (Preliminary result)   Collection Time: 12/09/16  9:29 AM  Result Value Ref Range Status   Specimen Description BLOOD RIGHT HAND  Final   Special Requests   Final    BOTTLES DRAWN AEROBIC AND ANAEROBIC Blood Culture adequate volume   Culture NO GROWTH 4 DAYS  Final   Report Status PENDING  Incomplete  Culture, blood (Routine X 2) w Reflex to ID Panel     Status: None (Preliminary result)   Collection Time: 12/09/16 11:51 AM  Result Value Ref Range Status   Specimen Description BLOOD RIGHT HAND  Final   Special Requests   Final    BOTTLES DRAWN AEROBIC AND ANAEROBIC Blood Culture adequate volume   Culture NO GROWTH 4 DAYS  Final   Report Status PENDING  Incomplete   Studies/Results: Dg Chest 2 View  Result Date: 12/13/2016 CLINICAL DATA:  Confusion with pleural effusion EXAM: CHEST  2 VIEW COMPARISON:  December 09, 2016 FINDINGS: There is a persistent right pleural effusion with consolidation in portions of the right middle and lower lobes. There is a minimal left pleural effusion. The left lung is otherwise clear. There is cardiomegaly with pulmonary venous hypertension. No adenopathy. There is aortic atherosclerosis. Patient is status post coronary artery bypass grafting. Bones are diffusely osteoporotic. IMPRESSION: Persistent fairly sizable right pleural effusion with consolidation in portions of the right middle and lower lobes. Small left pleural effusion. There is cardiomegaly with pulmonary venous hypertension indicative of pulmonary vascular congestion. There is aortic atherosclerosis. Appearance is essentially stable compared to recent prior study. Electronically Signed   By: Lowella Grip III M.D.   On: 12/13/2016 07:19   Dg Esophagus  Result Date:  12/12/2016 CLINICAL DATA:  Nausea and vomiting. EXAM: ESOPHOGRAM/BARIUM SWALLOW TECHNIQUE: Single contrast examination was performed using  thin. FLUOROSCOPY TIME:  Fluoroscopy Time:  3 minutes 0 seconds. Radiation Exposure Index (if provided by the fluoroscopic device): 28.1 Number of Acquired Spot Images: None COMPARISON:  Chest x-ray 12/09/2016. FINDINGS: Limited exam due the patient's condition. Patient could only drink a small amount of barium . 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 Esophageal spasm cannot be excluded. No reflux noted. No hiatal hernia noted. Right-sided pleural effusion noted. IMPRESSION: 1. Limited exam due to patient's condition. Patient could only drank a small  amount of barium . Tertiary esophageal contractions. 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 Esophageal spasm cannot be excluded. 2. Prominent right pleural effusion . Electronically Signed   By: Marcello Moores  Register   On: 12/12/2016 09:29   Medications: I have reviewed the patient's current medications. Scheduled Meds: . atorvastatin  10 mg Oral QODAY  . bacitracin   Topical BID  . enoxaparin (LOVENOX) injection  40 mg Subcutaneous Q24H  . escitalopram  10 mg Oral Daily  . feeding supplement (GLUCERNA SHAKE)  237 mL Oral TID BM  . insulin aspart  0-9 Units Subcutaneous TID AC & HS  . levothyroxine  112 mcg Oral QAC breakfast  . mouth rinse  15 mL Mouth Rinse BID  . mirtazapine  7.5 mg Oral QHS  . mycophenolate  360 mg Oral BID  . pantoprazole (PROTONIX) IV  40 mg Intravenous Q12H  . predniSONE  10 mg Oral Q breakfast  . sodium chloride flush  3 mL Intravenous Q12H  . tacrolimus  1 mg Oral BID  . torsemide  10 mg Oral Daily   Continuous Infusions: . fluconazole (DIFLUCAN) IV     PRN Meds:.acetaminophen **OR** acetaminophen, albuterol, HYDROcodone-acetaminophen, hyoscyamine, ondansetron **OR** ondansetron (ZOFRAN) IV, polyethylene glycol   Assessment: Active Problems:    AKI (acute kidney injury) ([place])  Elizabeth [name] 76 y.o. female being evaluated for dysphagia. Presented for vomiting of 2 weeks duration , diarrhea last 1.5 months . Recurrent pleural effusions , on coumadin for A fibb, H/o candida esophagitis . Barium swallowwas a limited exam but showed tertiary contractions , no clear obstruction. She does have a right pleural effusion, pulmonary hypertension, diastolic cardiac dysfunction .   Plan:  Since she has not responded to treatment with Diflucan and PPI for dysphagia , have no choice but to proceed with EGD. Discussed with Dr Darvin Neighbours and anesthesia and plan is to proceed with EGD , she has had her pleural effusion drained today .   I have discussed alternative options, risks & benefits,  which include, but are not limited to, bleeding, infection, perforation,respiratory complication & drug reaction.  The patient agrees with this plan & written consent will be obtained.        LOS: 6 days   Jonathon Bellows 12/13/2016, 3:38 PM

## 2016-12-13 NOTE — OR Nursing (Signed)
Dr Vicente Males said he will plan to do EGD on this pt tomorrow after [facility] tomorrow. He is aware that am ins booked in morning but he is understanding.

## 2016-12-13 NOTE — Progress Notes (Signed)
[facility] [place] NAME: Elizabeth [name]    MR#:  [id number]  DATE OF BIRTH:  July 07, 1941  SUBJECTIVE:   Barium swallow showed presbyesophagus.  Unable to tolerate even liquids. Was confused overnight. On 2 L oxygen.  REVIEW OF SYSTEMS:    Review of Systems  Constitutional: Negative for fever, chills,  +weight loss +Tired and weak HENT: Negative for ear pain, nosebleeds, congestion, facial swelling, rhinorrhea, neck pain, neck stiffness and ear discharge.   Respiratory: Negative for cough, shortness of breath, wheezing  Cardiovascular: Negative for chest pain, palpitations and leg swelling.  Gastrointestinal: Negative for heartburn, abdominal pain, vomiting, diarrhea or consitpation Genitourinary: Negative for dysuria, urgency, frequency, hematuria Musculoskeletal: Negative for back pain or joint pain Neurological: Negative for dizziness, seizures, syncope, focal weakness,  numbness and headaches.  Hematological: Does not bruise/bleed easily.  Psychiatric/Behavioral: Negative for hallucinations, confusion, dysphoric mood  DRUG ALLERGIES:   Allergies  Allergen Reactions  . Azithromycin Other (See Comments)    Upset stomach  . Erythromycin Nausea And Vomiting  . Fentanyl Nausea And Vomiting  . Percocet [Oxycodone-Acetaminophen] Nausea And Vomiting    VITALS:  Blood pressure (!) 153/48, pulse 63, temperature 97.5 F (36.4 C), temperature source Oral, resp. rate 18, height 5\' 3"  (1.6 m), weight 59 kg (130 lb 1.1 oz), SpO2 100 %.  PHYSICAL EXAMINATION:  Constitutional: Appears well-developed. No distress. HENT: Normocephalic. Marland Kitchen Oropharynx is clear and moist.  Eyes: Conjunctivae and EOM are normal. PERRLA, no scleral icterus. Mucus membranes dry Neck: Normal ROM. Neck supple. No JVD. No tracheal deviation. CVS: RRR, S1/S2 +, no murmurs, no gallops, no carotid bruit.  Pulmonary: Effort and breath sounds normal, no stridor, rhonchi,  wheezes, rales. Abdominal: Soft. BS +,  no distension, tenderness, rebound or guarding.  Musculoskeletal: Normal range of motion. No edema and no tenderness. Left BKA Neuro: Alert. CN 2-12 grossly intact. No focal deficits. Skin: Skin is warm and dry. No rash noted. Left cheek she has removed skin cancer. Psychiatric: Normal mood and affect.   LABORATORY PANEL:   CBC  Recent Labs Lab 12/12/16 0527  WBC 3.8  HGB 9.9*  HCT 30.3*  PLT 181   ------------------------------------------------------------------------------------------------------------------  Chemistries   Recent Labs Lab 12/07/16 1852  12/11/16 0625 12/12/16 0527  NA 135  < > 134* 138  K 6.3*  < > 5.3* 4.6  CL 98*  < > 101 103  CO2 33*  < > 29 30  GLUCOSE 120*  < > 224* 205*  BUN 63*  < > 30* 29*  CREATININE 2.59*  < > 1.20* 1.28*  CALCIUM 9.1  < > 8.5* 8.7*  MG  --   --  2.3  --   AST 24  --   --   --   ALT 13*  --   --   --   ALKPHOS 63  --   --   --   BILITOT 0.8  --   --   --   < > = values in this interval not displayed. ------------------------------------------------------------------------------------------------------------------  Cardiac Enzymes  Recent Labs Lab 12/07/16 1852  TROPONINI 0.03*   ------------------------------------------------------------------------------------------------------ RADIOLOGY:  Dg Chest 2 View  Result Date: 12/13/2016 CLINICAL DATA:  Confusion with pleural effusion EXAM: CHEST  2 VIEW COMPARISON:  December 09, 2016 FINDINGS: There is a persistent right pleural effusion with consolidation in portions of the right middle and lower lobes. There is a minimal left pleural effusion. The left  lung is otherwise clear. There is cardiomegaly with pulmonary venous hypertension. No adenopathy. There is aortic atherosclerosis. Patient is status post coronary artery bypass grafting. Bones are diffusely osteoporotic. IMPRESSION: Persistent fairly sizable right pleural effusion  with consolidation in portions of the right middle and lower lobes. Small left pleural effusion. There is cardiomegaly with pulmonary venous hypertension indicative of pulmonary vascular congestion. There is aortic atherosclerosis. Appearance is essentially stable compared to recent prior study. Electronically Signed   By: Lowella Grip III M.D.   On: 12/13/2016 07:19   Dg Esophagus  Result Date: 12/12/2016 CLINICAL DATA:  Nausea and vomiting. EXAM: ESOPHOGRAM/BARIUM SWALLOW TECHNIQUE: Single contrast examination was performed using  thin. FLUOROSCOPY TIME:  Fluoroscopy Time:  3 minutes 0 seconds. Radiation Exposure Index (if provided by the fluoroscopic device): 28.1 Number of Acquired Spot Images: None COMPARISON:  Chest x-ray 12/09/2016. FINDINGS: Limited exam due the patient's condition. Patient could only drink a small amount of barium . 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 Esophageal spasm cannot be excluded. No reflux noted. No hiatal hernia noted. Right-sided pleural effusion noted. IMPRESSION: 1. Limited exam due to patient's condition. Patient could only drank a small amount of barium . Tertiary esophageal contractions. 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 Esophageal spasm cannot be excluded. 2. Prominent right pleural effusion . Electronically Signed   By: Marcello Moores  Register   On: 12/12/2016 09:29   ASSESSMENT AND PLAN:   76 year old female with history of chronic diastolic heart failure and preserved ejection fraction, kidney transplant in 2001 on immunosuppressant medications, diabetes and PAF who presents with nausea, vomiting and generalized weakness and found to have acute kidney injury.  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 On IV diflucan Discussed with Dr. Vicente Males Continue clear liquids. Nothing by mouth after midnight. EGD tomorrow.  * DM with hypoglycemia due to insulin and not eating Stopped levemir. Now blood sugars  elevated likely due to IV steroids. Resume Levemir after EGD if able to tolerate diet.  * Acute kidney injury on chronic kidney disease stage III - resolved Creatinine improving and close to baseline  Prograf level elevated and dosing adjusted Appreciate nephrology consultation.  * Hypothermia: Possibly from hypoglycemia Resolved  * History of renal transplant on immunosuppressive medications: Continue medications   * Chronic diastolic CHF: Patient continues to have pleural effusion however is not symptomatic. Appreciate cardiology consult Torsemide held due to decreased oral intake.  * Acute hypoxic respiratory failure with Recurrent pleural effusion: Patient has had recent thoracentesis. Patient may need repeat thoracentesis if she is symptomatic.  * PAF: INR is elevated and Coumadin is on hold for now. In NSR  Coumadin held for EGD. Also received vitamin K 1 mg subcutaneous injection on 12/12/2016.  * Hypothyroid: Continue Synthroid  * CAD Stable  * DVT prophylaxis. Will add Lovenox.  Management plans discussed with the patient and she is in agreement.  CODE STATUS: FULL  TOTAL TIME TAKING CARE OF THIS PATIENT: 35 minutes.   Hillary Bow R M.D on 12/13/2016 at 1:17 PM  Between 7am to 6pm - Pager - [phone]  After 6pm go to [email or web address] - password [email or web address] [facility]  Office  [phone]  CC: Primary care physician; Donetta Potts, MD  Note: This dictation was prepared with Dragon dictation along with smaller phrase technology. Any transcriptional errors that result from this process are unintentional.

## 2016-12-13 NOTE — Progress Notes (Signed)
Nursing staff attempting to change patient, patient trying to punch and slap staff. Nursing staff attempted to deescalate situation and provide emotional support, however patient confused and unwilling to listen to staff. While attempting hit staff, patient hit right arm on side rail causing a skin tear. This RN cleansed site, and dressed with Mepitel dressing and kerlix roll gauze. Documentation to reflect. Nursing staff attempted to educate patient about skin, patient unwilling to listen. Will continue to monitor.   Iran Sizer M

## 2016-12-13 NOTE — Progress Notes (Signed)
Right-sided ultrasound-guided thoracentesis ordered. Discussed with ultrasound.  No labs needed on the fluid.

## 2016-12-13 NOTE — Progress Notes (Signed)
[facility] for Warfarin  Indication: atrial fibrillation  Allergies  Allergen Reactions  . Azithromycin Other (See Comments)    Upset stomach  . Erythromycin Nausea And Vomiting  . Fentanyl Nausea And Vomiting  . Percocet [Oxycodone-Acetaminophen] Nausea And Vomiting    Patient Measurements: Height: 5\' 3"  (160 cm) Weight: 130 lb 1.1 oz (59 kg) IBW/kg (Calculated) : 52.4  Vital Signs: Temp: 97.4 F (36.3 C) (04/24 0451) Temp Source: Oral (04/24 0451) BP: 177/80 (04/24 0451) Pulse Rate: 67 (04/24 0451)  Labs:  Recent Labs  12/11/16 0625 12/12/16 0527 12/13/16 0540  HGB 10.5* 9.9*  --   HCT 31.8* 30.3*  --   PLT 185 181  --   LABPROT 32.3* 30.3* 19.1*  INR 3.06 2.83 1.59  CREATININE 1.20* 1.28*  --     Estimated Creatinine Clearance: 30.4 mL/min (A) (by C-G formula based on SCr of 1.28 mg/dL (H)).   Medical History: Past Medical History:  Diagnosis Date  . Adenomatous polyp   . Anemia   . 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 Atrial fibrillation ([place])    a. on Coumadin. No rate-controlling medications with baseline bradycardia.   . Bradycardia   . CAD (coronary artery disease)    a. s/p 1-vessel CABG to LCx in 2001  . Carotid artery stenosis    mild  . Carotid bruit    left  . Chronic kidney disease    s/p cadaveric renal transplant in 2001  . Colitis   . Compression fracture of L4 lumbar vertebra ([facility])   . Coronary artery disease   . CVA (cerebral infarction)   . Diabetes mellitus   . Diabetic retinopathy   . Esophageal candidiasis ([place])   . Flu 11/22/2016  . Fracture of right maxilla 09/21/2012  . Fx ankle   . Fx wrist   . Gastritis   . GERD (gastroesophageal reflux disease)   . H/O immunosuppressive therapy   . History of orthostatic hypotension   . Hyperlipidemia   . Hyperparathyroidism   . Hypothyroidism   .  IBS (irritable bowel syndrome)   . Ischemic colitis ([place])   . Lactose intolerance   . Leg ulcer ([place])    from poor fitting prosthetic  . Melanoma ([place])   . Nephropathy   . 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 Neuropathy   . Osteoporosis    recurrent fractures  . Pancreatic cyst   . Pneumonia 11/2016  . Pulmonary embolism ([place])   . PVD (peripheral vascular disease) ([place])   . Renal artery stenosis ([place])   . 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 Renal osteodystrophy   . Right orbit fracture ([place]) 09/21/2012  . Stroke ([place])   . TBI (traumatic brain injury) ([place]) 09/30/2012  . Tubular adenoma   . Zygoma fracture ([place]) 09/21/2012    Assessment: 76 yo female with PMH history of A. Fib.  Pharmacy consulted for warfarin dosing and monitoring. INR supratherapeutic at 5.51 on admission.   Home Regimen: Warfarin 5mg  Sun, Tues, Thurs                             Warfarin 2.5mg  [place], Fri, Sat   DATE  INR  DOSE 4/18  5.51  HELD 4/19  5.79  HELD 4/20  4.79  HELD 4/21  4.34                 HELD 4/22                 3.06                 HELD 4/23  2.83  HELD **1 mg subcutaneous Vitamin K 4/23 @ 11:19** 4/24  1.59  HELD  Goal of Therapy:  INR 2-3 Monitor platelets by anticoagulation protocol: Yes   Plan:  MD request to hold warfarin until EGD done; need INR <1.6. Will recheck INR with AM labs.   Laural Benes, Pharm.D., BCPS Clinical Pharmacist 12/13/2016 7:38 AM

## 2016-12-14 ENCOUNTER — Inpatient Hospital Stay: Payer: Medicare Other | Admitting: Anesthesiology

## 2016-12-14 ENCOUNTER — Inpatient Hospital Stay: Payer: Medicare Other

## 2016-12-14 ENCOUNTER — Encounter
Admission: EM | Disposition: A | Discharge status: Skilled Nursing Facility | Payer: Self-pay | Source: Home / Self Care | Attending: Internal Medicine

## 2016-12-14 DIAGNOSIS — K449 Diaphragmatic hernia without obstruction or gangrene: Secondary | ICD-10-CM

## 2016-12-14 DIAGNOSIS — K319 Disease of stomach and duodenum, unspecified: Secondary | ICD-10-CM

## 2016-12-14 HISTORY — PX: ESOPHAGOGASTRODUODENOSCOPY (EGD) WITH PROPOFOL: SHX5813

## 2016-12-14 LAB — GLUCOSE, CAPILLARY
GLUCOSE-CAPILLARY: 196 mg/dL — AB (ref 65–99)
GLUCOSE-CAPILLARY: 128 mg/dL — AB (ref 65–99)
Glucose-Capillary: 213 mg/dL — ABNORMAL HIGH (ref 65–99)
Glucose-Capillary: 187 mg/dL — ABNORMAL HIGH (ref 65–99)

## 2016-12-14 LAB — CULTURE, BLOOD (ROUTINE X 2)
Culture: NO GROWTH
Culture: NO GROWTH
Special Requests: ADEQUATE
Special Requests: ADEQUATE

## 2016-12-14 LAB — BASIC METABOLIC PANEL
Anion gap: 7 (ref 5–15)
BUN: 34 mg/dL — AB (ref 6–20)
CO2: 29 mmol/L (ref 22–32)
CREATININE: 1.51 mg/dL — AB (ref 0.44–1.00)
Calcium: 8.6 mg/dL — ABNORMAL LOW (ref 8.9–10.3)
Chloride: 101 mmol/L (ref 101–111)
GFR calc Af Amer: 37 mL/min — ABNORMAL LOW (ref >60)
GFR, EST NON AFRICAN AMERICAN: 32 mL/min — AB (ref >60)
GLUCOSE: 187 mg/dL — AB (ref 65–99)
POTASSIUM: 3.7 mmol/L (ref 3.5–5.1)
Sodium: 137 mmol/L (ref 135–145)

## 2016-12-14 LAB — CBC
HEMATOCRIT: 27.6 % — AB (ref 35.0–47.0)
HEMOGLOBIN: 9.4 g/dL — AB (ref 12.0–16.0)
MCH: 33.2 pg (ref 26.0–34.0)
MCHC: 34.3 g/dL (ref 32.0–36.0)
MCV: 96.9 fL (ref 80.0–100.0)
Platelets: 148 10*3/uL — ABNORMAL LOW (ref 150–440)
RBC: 2.84 MIL/uL — ABNORMAL LOW (ref 3.80–5.20)
RDW: 16.3 % — AB (ref 11.5–14.5)
WBC: 4 10*3/uL (ref 3.6–11.0)

## 2016-12-14 LAB — APTT: aPTT: 30 seconds (ref 24–36)

## 2016-12-14 LAB — MAGNESIUM: Magnesium: 1.4 mg/dL — ABNORMAL LOW (ref 1.7–2.4)

## 2016-12-14 SURGERY — ESOPHAGOGASTRODUODENOSCOPY (EGD) WITH PROPOFOL
Anesthesia: General

## 2016-12-14 MED ORDER — INSULIN DETEMIR 100 UNIT/ML ~~LOC~~ SOLN
5.0000 [IU] | Freq: Every day | SUBCUTANEOUS | Status: DC
  Administered 2016-12-14 – 2016-12-15 (×2): 5 [IU] via SUBCUTANEOUS
  Filled 2016-12-14 (×3): qty 0.05

## 2016-12-14 MED ORDER — EPHEDRINE SULFATE 50 MG/ML IJ SOLN
INTRAMUSCULAR | Status: AC
  Filled 2016-12-14: qty 1

## 2016-12-14 MED ORDER — AMIODARONE HCL IN DEXTROSE 360-4.14 MG/200ML-% IV SOLN
60.0000 mg/h | INTRAVENOUS | Status: DC
  Administered 2016-12-14: 60 mg/h via INTRAVENOUS
  Filled 2016-12-14: qty 200

## 2016-12-14 MED ORDER — HEPARIN BOLUS VIA INFUSION
3000.0000 [IU] | Freq: Once | INTRAVENOUS | Status: AC
  Administered 2016-12-14: 3000 [IU] via INTRAVENOUS
  Filled 2016-12-14: qty 3000

## 2016-12-14 MED ORDER — SODIUM CHLORIDE 0.9% FLUSH
10.0000 mL | Freq: Two times a day (BID) | INTRAVENOUS | Status: DC
  Administered 2016-12-14 – 2016-12-15 (×2): 10 mL

## 2016-12-14 MED ORDER — SODIUM CHLORIDE 0.9% FLUSH
10.0000 mL | INTRAVENOUS | Status: DC | PRN
Start: 2016-12-14 — End: 2016-12-16

## 2016-12-14 MED ORDER — EPHEDRINE SULFATE-NACL 50-0.9 MG/10ML-% IV SOSY
PREFILLED_SYRINGE | INTRAVENOUS | Status: DC | PRN
  Administered 2016-12-14: 10 mg via INTRAVENOUS

## 2016-12-14 MED ORDER — HEPARIN (PORCINE) IN NACL 100-0.45 UNIT/ML-% IJ SOLN
650.0000 [IU]/h | INTRAMUSCULAR | Status: DC
  Administered 2016-12-14: 800 [IU]/h via INTRAVENOUS
  Filled 2016-12-14 (×2): qty 250

## 2016-12-14 MED ORDER — AMIODARONE HCL IN DEXTROSE 360-4.14 MG/200ML-% IV SOLN
30.0000 mg/h | INTRAVENOUS | Status: DC
  Filled 2016-12-14 (×2): qty 200

## 2016-12-14 MED ORDER — METOPROLOL TARTRATE 5 MG/5ML IV SOLN
5.0000 mg | Freq: Once | INTRAVENOUS | Status: AC
Start: 2016-12-14 — End: 2016-12-14
  Administered 2016-12-14: 5 mg via INTRAVENOUS
  Filled 2016-12-14: qty 5

## 2016-12-14 MED ORDER — WARFARIN SODIUM 1 MG PO TABS: 3.0000 mg | ORAL_TABLET | Freq: Every day | ORAL | Status: DC

## 2016-12-14 MED ORDER — METOPROLOL TARTRATE 25 MG PO TABS
12.5000 mg | ORAL_TABLET | Freq: Two times a day (BID) | ORAL | Status: DC
  Administered 2016-12-14 – 2016-12-15 (×3): 12.5 mg via ORAL
  Filled 2016-12-14 (×3): qty 1

## 2016-12-14 MED ORDER — PROPOFOL 10 MG/ML IV BOLUS
INTRAVENOUS | Status: DC | PRN
  Administered 2016-12-14 (×2): 20 mg via INTRAVENOUS
  Administered 2016-12-14: 50 mg via INTRAVENOUS
  Administered 2016-12-14: 10 mg via INTRAVENOUS

## 2016-12-14 MED ORDER — PROPOFOL 500 MG/50ML IV EMUL
INTRAVENOUS | Status: AC
  Filled 2016-12-14: qty 50

## 2016-12-14 MED ORDER — MAGNESIUM SULFATE 2 GM/50ML IV SOLN
2.0000 g | Freq: Once | INTRAVENOUS | Status: AC
  Administered 2016-12-14: 2 g via INTRAVENOUS
  Filled 2016-12-14: qty 50

## 2016-12-14 MED ORDER — SODIUM CHLORIDE 0.9 % IV SOLN
INTRAVENOUS | Status: DC
  Administered 2016-12-14: 15:00:00 via INTRAVENOUS

## 2016-12-14 MED ORDER — WARFARIN SODIUM 1 MG PO TABS
2.0000 mg | ORAL_TABLET | Freq: Every day | ORAL | Status: DC
  Administered 2016-12-14: 2 mg via ORAL
  Filled 2016-12-14: qty 2

## 2016-12-14 MED ORDER — WARFARIN - PHARMACIST DOSING INPATIENT
Freq: Every day | Status: DC
  Administered 2016-12-14: 17:00:00

## 2016-12-14 MED ORDER — AMIODARONE LOAD VIA INFUSION
150.0000 mg | Freq: Once | INTRAVENOUS | Status: AC
  Administered 2016-12-14: 150 mg via INTRAVENOUS
  Filled 2016-12-14: qty 83.34

## 2016-12-14 NOTE — Transfer of Care (Signed)
Immediate Anesthesia Transfer of Care Note  Patient: Elizabeth [name]  Procedure(s) Performed: Procedure(s): ESOPHAGOGASTRODUODENOSCOPY (EGD) WITH PROPOFOL (N/A)  Patient Location: PACU and Endoscopy Unit  Anesthesia Type:General  Level of Consciousness: sedated and responds to stimulation  Airway & Oxygen Therapy: Patient Spontanous Breathing and Patient connected to nasal cannula oxygen  Post-op Assessment: Report given to RN and Post -op Vital signs reviewed and stable  Post vital signs: stable  Last Vitals:  Vitals:   12/14/16 1540 12/14/16 1543  BP: (!) 123/47 (!) 123/47  Pulse: 68 67  Resp: (!) 31 (!) 30  Temp: 36.1 C 36.2 C    Last Pain:  Vitals:   12/14/16 1540  TempSrc: Tympanic  PainSc: 0-No pain         Complications: No apparent anesthesia complications

## 2016-12-14 NOTE — Progress Notes (Signed)
Subjective:  Patient due for EGD today. Resting comfortably in bed today. No new renal function testing today.  Objective:  Vital signs in last 24 hours:  Temp:  [97.8 F (36.6 C)-98.1 F (36.7 C)] 97.8 F (36.6 C) (04/25 1416) Pulse Rate:  [50-92] 92 (04/25 1416) Resp:  [14-18] 14 (04/25 1416) BP: (129-186)/(33-62) 156/43 (04/25 1416) SpO2:  [94 %-100 %] 100 % (04/25 1416) Weight:  [54.9 kg (121 lb)-55.2 kg (121 lb 12.8 oz)] 54.9 kg (121 lb) (04/25 1416)  Weight change: -3.752 kg (-8 lb 4.3 oz) Filed Weights   12/13/16 0500 12/14/16 0420 12/14/16 1416  Weight: 59 kg (130 lb 1.1 oz) 55.2 kg (121 lb 12.8 oz) 54.9 kg (121 lb)    Intake/Output:    Intake/Output Summary (Last 24 hours) at 12/14/16 1535 Last data filed at 12/14/16 1403  Gross per 24 hour  Intake           351.33 ml  Output                0 ml  Net           351.33 ml     Physical Exam: General: Chronically ill-appearing, lying in the bed  HEENT Anicteric, moist oral mucous membranes  Neck supple  Pulm/lungs Mild basilar crackles at left base  CVS/Heart Irregular, no rubs  Abdomen:  Soft, nontender, BS present  Extremities: + dependent edema  Neurologic: Alert, able to answer questions  Skin: Scattered ecchymoses          Basic Metabolic Panel:   Recent Labs Lab 12/08/16 0633 12/09/16 0734 12/10/16 0617 12/11/16 0625 12/12/16 0527  NA 134* 138 135 134* 138  K 5.2* 5.2* 5.3* 5.3* 4.6  CL 100* 104 103 101 103  CO2 32 31 28 29 30   GLUCOSE 84 47* 181* 224* 205*  BUN 56* 46* 35* 30* 29*  CREATININE 2.21* 1.90* 1.56* 1.20* 1.28*  CALCIUM 8.4* 8.6* 8.4* 8.5* 8.7*  MG  --   --   --  2.3  --      CBC:  Recent Labs Lab 12/07/16 1852 12/08/16 0633 12/10/16 0617 12/11/16 0625 12/12/16 0527  WBC 9.4 4.4 3.2* 4.1 3.8  NEUTROABS 8.8*  --   --  3.6 3.3  HGB 11.2* 9.6* 9.5* 10.5* 9.9*  HCT 34.0* 29.1* 29.2* 31.8* 30.3*  MCV 93.5 88.3 88.0 89.7 90.1  PLT 199 178 188 185 181       Microbiology:  Recent Results (from the past 720 hour(s))  Susceptibility, Aer + Anaerob     Status: None   Collection Time: 11/22/16 12:00 AM  Result Value Ref Range Status   Suscept, Aer + Anaerob Final report  Corrected    Comment: (NOTE) Performed At: [facility] [place], [place] [id number] Lindon Romp MD WU:[id number] CORRECTED ON 04/13 AT 9562: PREVIOUSLY REPORTED AS Preliminary report    Source of Sample URINE/ ENTEROCOCCUS FAECIUM SUSCEPTIBILITY  Final  Susceptibility Result     Status: None   Collection Time: 11/22/16 12:00 AM  Result Value Ref Range Status   Suscept Result 1 Enterococcus faecium  Final    Comment: (NOTE) Identification performed by account, not confirmed by this laboratory. Ciprofloxacin     >2      Resistant Nitrofurantoin    64      Intermediate Levofloxacin      >4      Resistant Tetracycline      >8  Resistant Rifampin         <=1      Susceptible    Antimicrobial Suscept Comment  Final    Comment: (NOTE)      ** S = Susceptible; I = Intermediate; R = Resistant **                   P = Positive; N = Negative            MICS are expressed in micrograms per mL   Antibiotic                 RSLT#1    RSLT#2    RSLT#3    RSLT#4 Penicillin                     R Vancomycin                     R Performed At: [facility] [place], [place] [id number] Lindon Romp MD OX:[id number]   Blood culture (routine x 2)     Status: None   Collection Time: 11/22/16 12:52 PM  Result Value Ref Range Status   Specimen Description BLOOD RIGHT ANTECUBITAL  Final   Special Requests   Final    BOTTLES DRAWN AEROBIC AND ANAEROBIC Blood Culture adequate volume   Culture NO GROWTH 5 DAYS  Final   Report Status 11/27/2016 FINAL  Final  Blood culture (routine x 2)     Status: None   Collection Time: 11/22/16 12:54 PM  Result Value Ref Range Status   Specimen Description BLOOD RIGHT FOREARM  Final    Special Requests   Final    BOTTLES DRAWN AEROBIC ONLY Blood Culture adequate volume   Culture NO GROWTH 5 DAYS  Final   Report Status 11/27/2016 FINAL  Final  Urine culture     Status: Abnormal   Collection Time: 11/22/16  3:44 PM  Result Value Ref Range Status   Specimen Description URINE, CATHETERIZED  Final   Special Requests NONE  Final   Culture (A)  Final    80,000 COLONIES/mL ENTEROCOCCUS FAECIUM SEE SEPARATE REPORT FOR SUSCEPTIBILITY RESULTS Performed at [place], READ BACK BY AND VERIFIED WITH: R SAWYER,RN AT 1221 11/26/16 BY L BENFIELD CONCERNING DELAY IN RESULTS    Report Status 12/05/2016 FINAL  Final  Gram stain     Status: None   Collection Time: 11/24/16  1:06 PM  Result Value Ref Range Status   Specimen Description Pleural R  Final   Special Requests NONE  Final   Gram Stain   Final    CYTOSPIN SMEAR WBC PRESENT,BOTH PMN AND MONONUCLEAR NO ORGANISMS SEEN    Report Status 11/24/2016 FINAL  Final  Culture, body fluid-bottle     Status: None   Collection Time: 11/24/16  1:06 PM  Result Value Ref Range Status   Specimen Description PLEURAL  Final   Special Requests NONE  Final   Culture NO GROWTH 5 DAYS  Final   Report Status 11/29/2016 FINAL  Final  MRSA PCR Screening     Status: None   Collection Time: 11/24/16  6:04 PM  Result Value Ref Range Status   MRSA by PCR NEGATIVE NEGATIVE Final    Comment:        The GeneXpert MRSA Assay (FDA approved for NASAL specimens only), is one component of a comprehensive MRSA colonization surveillance program. It is not  intended to diagnose MRSA infection nor to guide or monitor treatment for MRSA infections.   Culture, blood (routine x 2)     Status: None   Collection Time: 12/07/16  6:40 PM  Result Value Ref Range Status   Specimen Description BLOOD LEFT HAND  Final   Special Requests   Final    BOTTLES DRAWN AEROBIC AND ANAEROBIC Blood Culture results may not be optimal due to an  excessive volume of blood received in culture bottles   Culture NO GROWTH 5 DAYS  Final   Report Status 12/12/2016 FINAL  Final  Culture, blood (routine x 2)     Status: Abnormal   Collection Time: 12/07/16  6:40 PM  Result Value Ref Range Status   Specimen Description BLOOD  R AC  Final   Special Requests BOTTLES DRAWN AEROBIC AND ANAEROBIC BCAV  Final   Culture  Setup Time   Final    GRAM POSITIVE COCCI AEROBIC BOTTLE ONLY CRITICAL RESULT CALLED TO, READ BACK BY AND VERIFIED WITH: Matt McBane @ [phone] 12/09/16 by [facility]    Culture (A)  Final    STAPHYLOCOCCUS SPECIES (COAGULASE NEGATIVE) THE SIGNIFICANCE OF ISOLATING THIS ORGANISM FROM A SINGLE SET OF BLOOD CULTURES WHEN MULTIPLE SETS ARE DRAWN IS UNCERTAIN. PLEASE NOTIFY THE MICROBIOLOGY DEPARTMENT WITHIN ONE WEEK IF SPECIATION AND SENSITIVITIES ARE REQUIRED. Performed at [facility], [place] [address], [place], [place] [address]    Report Status 12/11/2016 FINAL  Final  Blood Culture ID Panel (Reflexed)     Status: None   Collection Time: 12/07/16  6:40 PM  Result Value Ref Range Status   Enterococcus species NOT DETECTED NOT DETECTED Final   Listeria monocytogenes NOT DETECTED NOT DETECTED Final   Staphylococcus species NOT DETECTED NOT DETECTED Final   Staphylococcus aureus NOT DETECTED NOT DETECTED Final   Streptococcus species NOT DETECTED NOT DETECTED Final   Streptococcus agalactiae NOT DETECTED NOT DETECTED Final   Streptococcus pneumoniae NOT DETECTED NOT DETECTED Final   Streptococcus pyogenes NOT DETECTED NOT DETECTED Final   Acinetobacter baumannii NOT DETECTED NOT DETECTED Final   Enterobacteriaceae species NOT DETECTED NOT DETECTED Final   Enterobacter cloacae complex NOT DETECTED NOT DETECTED Final   Escherichia coli NOT DETECTED NOT DETECTED Final   Klebsiella oxytoca NOT DETECTED NOT DETECTED Final   Klebsiella pneumoniae NOT DETECTED NOT DETECTED Final   Proteus species NOT DETECTED NOT DETECTED Final    Serratia marcescens NOT DETECTED NOT DETECTED Final   Haemophilus influenzae NOT DETECTED NOT DETECTED Final   Neisseria meningitidis NOT DETECTED NOT DETECTED Final   Pseudomonas aeruginosa NOT DETECTED NOT DETECTED Final   Candida albicans NOT DETECTED NOT DETECTED Final   Candida glabrata NOT DETECTED NOT DETECTED Final   Candida krusei NOT DETECTED NOT DETECTED Final   Candida parapsilosis NOT DETECTED NOT DETECTED Final   Candida tropicalis NOT DETECTED NOT DETECTED Final  Culture, blood (Routine X 2) w Reflex to ID Panel     Status: None   Collection Time: 12/09/16  9:29 AM  Result Value Ref Range Status   Specimen Description BLOOD RIGHT HAND  Final   Special Requests   Final    BOTTLES DRAWN AEROBIC AND ANAEROBIC Blood Culture adequate volume   Culture NO GROWTH 5 DAYS  Final   Report Status 12/14/2016 FINAL  Final  Culture, blood (Routine X 2) w Reflex to ID Panel     Status: None   Collection Time: 12/09/16 11:51 AM  Result  Value Ref Range Status   Specimen Description BLOOD RIGHT HAND  Final   Special Requests   Final    BOTTLES DRAWN AEROBIC AND ANAEROBIC Blood Culture adequate volume   Culture NO GROWTH 5 DAYS  Final   Report Status 12/14/2016 FINAL  Final    Coagulation Studies:  Recent Labs  12/12/16 0527 12/13/16 0540  LABPROT 30.3* 19.1*  INR 2.83 1.59    Urinalysis: No results for input(s): COLORURINE, LABSPEC, PHURINE, GLUCOSEU, HGBUR, BILIRUBINUR, KETONESUR, PROTEINUR, UROBILINOGEN, NITRITE, LEUKOCYTESUR in the last 72 hours.  Invalid input(s): APPERANCEUR    Imaging: Dg Chest 1 View  Result Date: 12/13/2016 CLINICAL DATA:  Right thoracentesis. EXAM: CHEST 1 VIEW COMPARISON:  12/13/2016 FINDINGS: Small bilateral pleural effusions. Mild right basilar atelectasis. No pneumothorax. Stable cardiomegaly. Prior CABG. No acute osseous abnormality. IMPRESSION: Small bilateral pleural effusions. No right pneumothorax status post thoracentesis. Electronically  Signed   By: Kathreen Devoid   On: 12/13/2016 15:54   Dg Chest 2 View  Result Date: 12/13/2016 CLINICAL DATA:  Confusion with pleural effusion EXAM: CHEST  2 VIEW COMPARISON:  December 09, 2016 FINDINGS: There is a persistent right pleural effusion with consolidation in portions of the right middle and lower lobes. There is a minimal left pleural effusion. The left lung is otherwise clear. There is cardiomegaly with pulmonary venous hypertension. No adenopathy. There is aortic atherosclerosis. Patient is status post coronary artery bypass grafting. Bones are diffusely osteoporotic. IMPRESSION: Persistent fairly sizable right pleural effusion with consolidation in portions of the right middle and lower lobes. Small left pleural effusion. There is cardiomegaly with pulmonary venous hypertension indicative of pulmonary vascular congestion. There is aortic atherosclerosis. Appearance is essentially stable compared to recent prior study. Electronically Signed   By: Lowella Grip III M.D.   On: 12/13/2016 07:19   US Thoracentesis Asp Pleural Space W/img Guide  Result Date: 12/13/2016 INDICATION: Shortness of breath EXAM: ULTRASOUND GUIDED RIGHT THORACENTESIS MEDICATIONS: None. COMPLICATIONS: None immediate. PROCEDURE: An ultrasound guided thoracentesis was thoroughly discussed with the patient and questions answered. The benefits, risks, alternatives and complications were also discussed. The patient understands and wishes to proceed with the procedure. Written consent was obtained. Ultrasound was performed to localize and mark an adequate pocket of fluid in the right chest. The area was then prepped and draped in the normal sterile fashion. 1% Lidocaine was used for local anesthesia. Under ultrasound guidance a 6 Fr Safe-T-Centesis catheter was introduced. Thoracentesis was performed. The catheter was removed and a dressing applied. FINDINGS: A total of approximately 1000 mL of straw-colored fluid was removed.  Samples were sent to the laboratory as requested by the clinical team. IMPRESSION: Successful ultrasound guided right thoracentesis yielding 1000 mL of pleural fluid. Electronically Signed   By: Kathreen Devoid   On: 12/13/2016 15:53     Medications:   . sodium chloride 20 mL/hr at 12/13/16 2056  . sodium chloride 20 mL/hr at 12/14/16 1430  . [MAR Hold] fluconazole (DIFLUCAN) IV Stopped (12/13/16 1828)   . [MAR Hold] atorvastatin  10 mg Oral QODAY  . [MAR Hold] bacitracin   Topical BID  . [MAR Hold] enoxaparin (LOVENOX) injection  40 mg Subcutaneous Q24H  . [MAR Hold] escitalopram  10 mg Oral Daily  . [MAR Hold] feeding supplement (GLUCERNA SHAKE)  237 mL Oral TID BM  . [MAR Hold] insulin aspart  0-9 Units Subcutaneous TID AC & HS  . [MAR Hold] insulin detemir  5 Units Subcutaneous QHS  . [facility]  Hold] levothyroxine  112 mcg Oral QAC breakfast  . [MAR Hold] mouth rinse  15 mL Mouth Rinse BID  . [MAR Hold] mirtazapine  7.5 mg Oral QHS  . [MAR Hold] mycophenolate  360 mg Oral BID  . [MAR Hold] pantoprazole (PROTONIX) IV  40 mg Intravenous Q12H  . [MAR Hold] predniSONE  10 mg Oral Q breakfast  . [MAR Hold] sodium chloride flush  3 mL Intravenous Q12H  . [MAR Hold] tacrolimus  1 mg Oral BID  . [MAR Hold] torsemide  10 mg Oral Daily  . [MAR Hold] warfarin  2 mg Oral q1800  . [MAR Hold] Warfarin - Pharmacist Dosing Inpatient   Does not apply q1800   [MAR Hold] acetaminophen **OR** [MAR Hold] acetaminophen, [MAR Hold] albuterol, [MAR Hold] HYDROcodone-acetaminophen, [MAR Hold] hyoscyamine, [MAR Hold] ondansetron **OR** [MAR Hold] ondansetron (ZOFRAN) IV, [MAR Hold] polyethylene glycol  Assessment/ Plan:  76 y.o.caucasian female with medical problems of Coronary disease with CABG in 11/22/99, end-stage renal disease, was on dialysis for 5 years and got deceased donor kidney transplant  in 1999-11-22, stroke x3 with last in 1999/11/22, type 2 diabetes, Atrial fibrillation, peripheral vascular disease with left BKA,  right upper extremity DVT requiring warfarin, who was admitted to [facility] on 12/07/2016 for evaluation of weakness, vomiting and diarrhea. Patient was hospitalized 2 weeks ago for pneumonia.  She was positive for influenza B at that time.  1.  Acute kidney injury  2. Chronic kidney disease st  3 Baseline creatinine 1.45 from April 12 3.  Renal transplant status. Deceased donor 11/22/1999. Followed by Dr Arty Baumgartner in [place] 4.  Hyperkalemia  Plan:  We plan to followup renal function tomorrow a.m.  We will also recheck tacrolimus level at that time.  She is due for EGD today for dysphagia.  We also plan to followup serum potassium as previously this was found to be high.  Otherwise continue current doses of prednisone, tacrolimus, and mycophenolate.    LOS: 7 Cougar Imel 4/25/20183:35 PM

## 2016-12-14 NOTE — Progress Notes (Addendum)
Inpatient Diabetes Program Recommendations  AACE/ADA: New Consensus Statement on Inpatient Glycemic Control (2015)  Target Ranges:  Prepandial:   less than 140 mg/dL      Peak postprandial:   less than 180 mg/dL (1-2 hours)      Critically ill patients:  140 - 180 mg/dL  Results for Elizabeth [name], Elizabeth [name] (MRN [id number]) as of 12/14/2016 11:12  Ref. Range 12/13/2016 07:39 12/13/2016 09:30 12/13/2016 11:52 12/13/2016 16:58 12/13/2016 20:38 12/14/2016 07:35  Glucose-Capillary Latest Ref Range: 65 - 99 mg/dL 240 (H) 272 (H) 307 (H) 197 (H) 142 (H) 213 (H)    Review of Glycemic Control  Current orders for Inpatient glycemic control: Novolog 0-9 units ACHS  Inpatient Diabetes Program Recommendations: Insulin - Basal: Please consider ordering Lantus 4 units Q24H starting now.  Thanks, Barnie Alderman, RN, MSN, CDE Diabetes Coordinator Inpatient Diabetes Program [phone] (Team Pager from 8am to 5pm)

## 2016-12-14 NOTE — Anesthesia Postprocedure Evaluation (Signed)
Anesthesia Post Note  Patient: Elizabeth [name]  Procedure(s) Performed: Procedure(s) (LRB): ESOPHAGOGASTRODUODENOSCOPY (EGD) WITH PROPOFOL (N/A)  Patient location during evaluation: Endoscopy Anesthesia Type: General Level of consciousness: awake and alert Pain management: pain level controlled Vital Signs Assessment: post-procedure vital signs reviewed and stable Respiratory status: spontaneous breathing, nonlabored ventilation, respiratory function stable and patient connected to nasal cannula oxygen Cardiovascular status: blood pressure returned to baseline and stable Postop Assessment: no signs of nausea or vomiting Anesthetic complications: no     Last Vitals:  Vitals:   12/14/16 1610 12/14/16 1628  BP: (!) 145/51 (!) 135/41  Pulse: (!) 59 (!) 57  Resp: 14 16  Temp:  36.8 C    Last Pain:  Vitals:   12/14/16 1628  TempSrc: Oral  PainSc: 0-No pain                 Precious Haws Piscitello

## 2016-12-14 NOTE — Progress Notes (Signed)
Dr. Gaetano Hawthorne with Cardiology made aware of patient status. Agrees with current treatment. If no success with Metoprolol, suggested giving Adenosine. HR still ranging from 120-160. NT to obtain EKG.

## 2016-12-14 NOTE — Progress Notes (Signed)
ANTICOAGULATION CONSULT NOTE - Initial Consult  Pharmacy Consult for Heparin  Indication: rapid heart beat   Allergies  Allergen Reactions  . Azithromycin Other (See Comments)    Upset stomach  . Erythromycin Nausea And Vomiting  . Fentanyl Nausea And Vomiting  . Percocet [Oxycodone-Acetaminophen] Nausea And Vomiting    Patient Measurements: Height: 5\' 3"  (160 cm) Weight: 121 lb (54.9 kg) IBW/kg (Calculated) : 52.4 Heparin Dosing Weight:   Vital Signs: Temp: 98.2 F (36.8 C) (04/25 1923) Temp Source: Oral (04/25 1923) BP: 79/34 (04/25 1948) Pulse Rate: 87 (04/25 1948)  Labs:  Recent Labs  12/12/16 0527 12/13/16 0540 12/14/16 1835  HGB 9.9*  --   --   HCT 30.3*  --   --   PLT 181  --   --   LABPROT 30.3* 19.1*  --   INR 2.83 1.59  --   CREATININE 1.28*  --  1.51*    Estimated Creatinine Clearance: 25.8 mL/min (A) (by C-G formula based on SCr of 1.51 mg/dL (H)).   Medical History: Past Medical History:  Diagnosis Date  . Adenomatous polyp   . Anemia   . 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 Atrial fibrillation ([place])    a. on Coumadin. No rate-controlling medications with baseline bradycardia.   . Bradycardia   . CAD (coronary artery disease)    a. s/p 1-vessel CABG to LCx in 2001  . Carotid artery stenosis    mild  . Carotid bruit    left  . Chronic kidney disease    s/p cadaveric renal transplant in 2001  . Colitis   . Compression fracture of L4 lumbar vertebra (H[facility])   . Coronary artery disease   . CVA (cerebral infarction)   . Diabetes mellitus   . Diabetic retinopathy   . Esophageal candidiasis ([place])   . Flu 11/22/2016  . Fracture of right maxilla 09/21/2012  . Fx ankle   . Fx wrist   . Gastritis   . GERD (gastroesophageal reflux disease)   . H/O immunosuppressive therapy   . History of orthostatic hypotension   . Hyperlipidemia   .  Hyperparathyroidism   . Hypothyroidism   . IBS (irritable bowel syndrome)   . Ischemic colitis ([place])   . Lactose intolerance   . Leg ulcer ([place])    from poor fitting prosthetic  . Melanoma ([place])   . Nephropathy   . 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 Neuropathy   . Osteoporosis    recurrent fractures  . Pancreatic cyst   . Pneumonia 11/2016  . Pulmonary embolism ([place])   . PVD (peripheral vascular disease) ([place])   . Renal artery stenosis ([place])   . 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 Renal osteodystrophy   . Right orbit fracture ([place]) 09/21/2012  . Stroke ([place])   . TBI (traumatic brain injury) ([place]) 09/30/2012  . Tubular adenoma   . Zygoma fracture ([place]) 09/21/2012    Medications:  Prescriptions Prior to Admission  Medication Sig Dispense Refill Last Dose  . acetaminophen (TYLENOL) 500 MG tablet Take 1,000 mg by mouth every 6 (six) hours as needed for moderate pain or headache.    prn at prn  . atorvastatin (LIPITOR) 10 MG tablet Take 1 tablet (10 mg total) by mouth every other day. 30 tablet 1 12/06/2016 at pm  . cholecalciferol (VITAMIN D) 1000 UNITS  tablet Take 2,000 Units by mouth daily.    12/07/2016 at 1200  . docusate sodium (COLACE) 100 MG capsule Take 100 mg by mouth daily as needed for moderate constipation.    prn at prn  . escitalopram (LEXAPRO) 10 MG tablet Take 10 mg by mouth daily.   12/07/2016 at am  . ferrous sulfate (SM IRON) 325 (65 FE) MG tablet Take 325 mg by mouth daily.    12/06/2016 at late afternoon  . HYDROcodone-acetaminophen (NORCO/VICODIN) 5-325 MG per tablet Take 2 tablets by mouth 2 (two) times daily as needed for moderate pain.    prn at prn  . hyoscyamine (ANASPAZ) 0.125 MG TBDP disintergrating tablet Place 0.125 mg under the tongue as needed for cramping.    prn at prn  . insulin glargine (LANTUS) 100 UNIT/ML injection Inject 7 Units into the skin every morning.    12/07/2016 at am  . insulin lispro (HUMALOG) 100 UNIT/ML injection  Inject 2-9 Units into the skin 4 (four) times daily -  before meals and at bedtime. Per sliding scale.   prn at prn  . levothyroxine (SYNTHROID, LEVOTHROID) 112 MCG tablet Take 112 mcg by mouth daily before breakfast.   12/07/2016 at am  . magnesium gluconate (MAGONATE) 500 MG tablet Take 1,000 mg by mouth daily.    12/06/2016 at late afternoon  . mirtazapine (REMERON) 7.5 MG tablet Take 7.5 mg by mouth at bedtime.    12/06/2016 at qhs   . Multiple Vitamin (MULTIVITAMIN WITH MINERALS) TABS Take 1 tablet by mouth daily.   12/07/2016 at 1200  . mycophenolate (MYFORTIC) 360 MG TBEC Take 360 mg by mouth 2 (two) times daily.    12/07/2016 at am  . nitroGLYCERIN (NITROSTAT) 0.4 MG SL tablet Place 1 tablet (0.4 mg total) under the tongue every 5 (five) minutes as needed for chest pain. 25 tablet 3 prn at prn  . omeprazole (PRILOSEC) 20 MG capsule Take 20 mg by mouth at bedtime.   12/06/2016 at qhs   . ondansetron (ZOFRAN) 4 MG tablet Take 4 mg by mouth every 8 (eight) hours as needed for nausea or vomiting.    12/07/2016 at 1000  . potassium chloride (K-DUR) 10 MEQ tablet Take 10 mEq by mouth daily at 12 noon.    12/07/2016 at 1200  . predniSONE (DELTASONE) 5 MG tablet Take 5 mg by mouth every morning.    12/07/2016 at am  . promethazine (PHENERGAN) 25 MG tablet Take 1 tablet (25 mg total) by mouth every 6 (six) hours as needed for nausea or vomiting. 30 tablet 1 prn at prn  . tacrolimus (PROGRAF) 0.5 MG capsule Take 1-2 mg by mouth See admin instructions. Take 2mg  in the am & 1mg  at night    12/07/2016 at am  . torsemide (DEMADEX) 20 MG tablet TAKE ONE TABLET BY MOUTH TWICE DAILY AS NEEDED (Patient taking differently: Take 10 mg by mouth at bedtime alternating with 20 mg by mouth at bedtime.) 180 tablet 2 Past Week at Unknown time  . warfarin (COUMADIN) 2.5 MG tablet TAKE AS DIRECTED BY COUMADIN CLINIC (Patient taking differently: Take 5 mg by mouth daily on Sunday, Tuesday and Thursday. Take 2.5 mg by mouth daily on  all other days) 150 tablet 1 12/06/2016 at pm  . midodrine (PROAMATINE) 10 MG tablet Take 1 tablet (10 mg total) by mouth 3 (three) times daily as needed. (Patient not taking: Reported on 12/07/2016) 90 tablet 6 Not Taking at prn   Scheduled:  .  amiodarone  150 mg Intravenous Once  . atorvastatin  10 mg Oral QODAY  . bacitracin   Topical BID  . escitalopram  10 mg Oral Daily  . feeding supplement (GLUCERNA SHAKE)  237 mL Oral TID BM  . heparin  3,000 Units Intravenous Once  . insulin aspart  0-9 Units Subcutaneous TID AC & HS  . insulin detemir  5 Units Subcutaneous QHS  . levothyroxine  112 mcg Oral QAC breakfast  . mouth rinse  15 mL Mouth Rinse BID  . metoprolol tartrate  12.5 mg Oral BID  . mirtazapine  7.5 mg Oral QHS  . mycophenolate  360 mg Oral BID  . pantoprazole (PROTONIX) IV  40 mg Intravenous Q12H  . predniSONE  10 mg Oral Q breakfast  . sodium chloride flush  3 mL Intravenous Q12H  . tacrolimus  1 mg Oral BID  . torsemide  10 mg Oral Daily  . warfarin  2 mg Oral q1800  . Warfarin - Pharmacist Dosing Inpatient   Does not apply q1800    Assessment: Pharmacy consulted to dose and monitor  Heparin therapy in this 76 year old woman with history of atrial fibrillation  Goal of Therapy:  Heparin level 0.3-0.7 units/ml Monitor platelets by anticoagulation protocol: Yes   Plan:  Will give 3000 units of heparin bolus and will start heparin gtt @ 800 units/hr. Will check Heparin level and CBC @ 0530 on 4/26.   Azul Brumett D 12/14/2016,7:53 PM

## 2016-12-14 NOTE — Op Note (Signed)
[facility] Gastroenterology Patient Name: Elizabeth [name] Procedure Date: 12/14/2016 3:07 PM MRN: [id number] Account #: [id number] Date of Birth: 10/08/1940 Admit Type: Inpatient Age: 76 Room: [facility] ENDO ROOM 1 Gender: Female Note Status: Finalized Procedure:            Upper GI endoscopy Indications:          Esophageal dysphagia Providers:            Jonathon Bellows MD, MD Referring MD:         Donetta Potts MD, MD (Referring MD) Medicines:            Monitored Anesthesia Care Complications:        No immediate complications. Procedure:            Pre-Anesthesia Assessment:                       - Prior to the procedure, a History and Physical was                        performed, and patient medications, allergies and                        sensitivities were reviewed. The patient's tolerance of                        previous anesthesia was reviewed.                       - The risks and benefits of the procedure and the                        sedation options and risks were discussed with the                        patient. All questions were answered and informed                        consent was obtained.                       - ASA Grade Assessment: IV - A patient with severe                        systemic disease that is a constant threat to life.                       After obtaining informed consent, the endoscope was                        passed under direct vision. Throughout the procedure,                        the patient's blood pressure, pulse, and oxygen                        saturations were monitored continuously. The Endoscope                        was introduced through the mouth, and advanced to the  third part of duodenum. The upper GI endoscopy was                        somewhat difficult due to the patient's oxygen                        desaturation. Successful completion of the procedure         was aided by withdrawal of the endoscopy {skip}. Findings:      The examined duodenum was normal.      A 2 cm hiatal hernia was present.      A medium-sized, submucosal, non-circumferential mass with no bleeding       and no stigmata of recent bleeding was found in the cardia.      The lumen of the lower third of the esophagus was moderately dilated.      One moderate (circumferential scarring or stenosis; an endoscope may       pass) benign-appearing, intrinsic stenosis was found 40 cm from the       incisors. This measured 1.2 cm (inner diameter) x 1 cm (in length) and       was traversed. A TTS dilator was passed through the scope. Dilation with       a 12-13.5-15 mm balloon dilator was performed to 15 mm. The dilation       site was examined following endoscope reinsertion and showed no change.       There was marked elevation of tone of LES, likely has achalsia or       hypertonic LES Impression:           - Normal examined duodenum.                       - 2 cm hiatal hernia.                       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                       - Dilation in the lower third of the esophagus.                       - Benign-appearing esophageal stenosis. Dilated.                       - No specimens collected. Recommendation:       - Return patient to hospital ward for ongoing care.                       - Clear liquid diet today.                       - 1.Towards the end of the procedure she desaturated to                        the 60's and I had to withdraw the endoscope, she had                        features suggestive of candida esophagitis in the                        esophagus  but I didnt get an oppurtunity to take any                        brushing. Would empirically treat her for the same.                       2. She has a submmucosal lesion about 2 cm in diameter                        in the cardia of the stomach, if further evaluation by                         the patient is desired then EUS is the next step that                        can be discussed as an outpatient                       3. She probably has achalasia and diagnosis can be                        determined by esophageal manometry as an outpatient                        followed by specific therapy/                       4. At this time I have dilated her to 15 mm which is                        sufficient for a stricture normally but not for                        achalasia if present. Let us see how she does with her                        swallowing . No further treatment possible at this time                        endoscopically as she tolerated the procedure very                        poorly with oxygen desaturation.                       5. If she does not tolerate oral intake then options                        are NG feeding or PEG tube . Procedure Code(s):    --- Professional ---                       [phone], Esophagogastroduodenoscopy, flexible, transoral;                        with transendoscopic balloon dilation of esophagus                        (less than 30 mm diameter)  Diagnosis Code(s):    --- Professional ---                       K44.9, Diaphragmatic hernia without obstruction or                        gangrene                       D49.0, Neoplasm of unspecified behavior of digestive                        system                       K22.8, Other specified diseases of esophagus                       K22.2, Esophageal obstruction                       R13.14, Dysphagia, pharyngoesophageal phase CPT copyright 2016 American Medical Association. All rights reserved. The codes documented in this report are preliminary and upon coder review may  be revised to meet current compliance requirements. Jonathon Bellows, MD Jonathon Bellows MD, MD 12/14/2016 3:38:08 PM This report has been signed electronically. Number of Addenda: 0 Note Initiated On:  12/14/2016 3:07 PM      [facility]

## 2016-12-14 NOTE — Progress Notes (Signed)
Notified by CCMD that patient's HR has been 130-160, sustaining in the 160's. Patient has no complaints besides "feeling lousy." Dr. Darvin Neighbours, made aware, new orders for IV and PO Metoprolol.

## 2016-12-14 NOTE — Progress Notes (Signed)
Peripherally Inserted Central Catheter/Midline Placement  The IV Nurse has discussed with the patient and/or persons authorized to consent for the patient, the purpose of this procedure and the potential benefits and risks involved with this procedure.  The benefits include less needle sticks, lab draws from the catheter, and the patient may be discharged home with the catheter. Risks include, but not limited to, infection, bleeding, blood clot (thrombus formation), and puncture of an artery; nerve damage and irregular heartbeat and possibility to perform a PICC exchange if needed/ordered by physician.  Alternatives to this procedure were also discussed.  Bard Power PICC patient education guide, fact sheet on infection prevention and patient information card has been provided to patient /or left at bedside.    PICC/Midline Placement Documentation  PICC Single Lumen 12/14/16 PICC Right Brachial 37 cm 0 cm (Active)  Indication for Insertion or Continuance of Line Vasoactive infusions 12/14/2016 10:00 PM  Exposed Catheter (cm) 0 cm 12/14/2016 10:00 PM  Dressing Change Due 12/21/16 12/14/2016 10:00 PM       Jule Economy Horton 12/14/2016, 10:14 PM

## 2016-12-14 NOTE — Progress Notes (Signed)
[facility] [place] NAME: Elizabeth [name]    MR#:  [id number]  DATE OF BIRTH:  [birth year]  SUBJECTIVE:   Barium swallow showed presbyesophagus.  NPO today for EGD. No concerns Weakness  REVIEW OF SYSTEMS:    Review of Systems  Constitutional: Negative for fever, chills,  +weight loss +Tired and weak HENT: Negative for ear pain, nosebleeds, congestion, facial swelling, rhinorrhea, neck pain, neck stiffness and ear discharge.   Respiratory: Negative for cough, shortness of breath, wheezing  Cardiovascular: Negative for chest pain, palpitations and leg swelling.  Gastrointestinal: Negative for heartburn, abdominal pain, vomiting, diarrhea or consitpation Genitourinary: Negative for dysuria, urgency, frequency, hematuria Musculoskeletal: Negative for back pain or joint pain Neurological: Negative for dizziness, seizures, syncope, focal weakness,  numbness and headaches.  Hematological: Does not bruise/bleed easily.  Psychiatric/Behavioral: Negative for hallucinations, confusion, dysphoric mood  DRUG ALLERGIES:   Allergies  Allergen Reactions  . Azithromycin Other (See Comments)    Upset stomach  . Erythromycin Nausea And Vomiting  . Fentanyl Nausea And Vomiting  . Percocet [Oxycodone-Acetaminophen] Nausea And Vomiting    VITALS:  Blood pressure (!) 174/51, pulse (!) 55, temperature 98 F (36.7 C), temperature source Oral, resp. rate 18, height 5\' 3"  (1.6 m), weight 55.2 kg (121 lb 12.8 oz), SpO2 100 %.  PHYSICAL EXAMINATION:  Constitutional: Appears well-developed. No distress. HENT: Normocephalic. Marland Kitchen Oropharynx is clear and moist.  Eyes: Conjunctivae and EOM are normal. PERRLA, no scleral icterus. Mucus membranes dry Neck: Normal ROM. Neck supple. No JVD. No tracheal deviation. CVS: RRR, S1/S2 +, no murmurs, no gallops, no carotid bruit.  Pulmonary: Effort and breath sounds normal, no stridor, rhonchi, wheezes,  rales. Abdominal: Soft. BS +,  no distension, tenderness, rebound or guarding.  Musculoskeletal: Normal range of motion. No edema and no tenderness. Left BKA Neuro: Alert. CN 2-12 grossly intact. No focal deficits. Skin: Skin is warm and dry. No rash noted.  Psychiatric: Normal mood and affect.   LABORATORY PANEL:   CBC  Recent Labs Lab 12/12/16 0527  WBC 3.8  HGB 9.9*  HCT 30.3*  PLT 181   ------------------------------------------------------------------------------------------------------------------  Chemistries   Recent Labs Lab 12/07/16 1852  12/11/16 0625 12/12/16 0527  NA 135  < > 134* 138  K 6.3*  < > 5.3* 4.6  CL 98*  < > 101 103  CO2 33*  < > 29 30  GLUCOSE 120*  < > 224* 205*  BUN 63*  < > 30* 29*  CREATININE 2.59*  < > 1.20* 1.28*  CALCIUM 9.1  < > 8.5* 8.7*  MG  --   --  2.3  --   AST 24  --   --   --   ALT 13*  --   --   --   ALKPHOS 63  --   --   --   BILITOT 0.8  --   --   --   < > = values in this interval not displayed. ------------------------------------------------------------------------------------------------------------------  Cardiac Enzymes  Recent Labs Lab 12/07/16 1852  TROPONINI 0.03*   ------------------------------------------------------------------------------------------------------ RADIOLOGY:  Dg Chest 1 View  Result Date: 12/13/2016 CLINICAL DATA:  Right thoracentesis. EXAM: CHEST 1 VIEW COMPARISON:  12/13/2016 FINDINGS: Small bilateral pleural effusions. Mild right basilar atelectasis. No pneumothorax. Stable cardiomegaly. Prior CABG. No acute osseous abnormality. IMPRESSION: Small bilateral pleural effusions. No right pneumothorax status post thoracentesis. Electronically Signed   By: Kathreen Devoid   On: 12/13/2016 15:54  Dg Chest 2 View  Result Date: 12/13/2016 CLINICAL DATA:  Confusion with pleural effusion EXAM: CHEST  2 VIEW COMPARISON:  December 09, 2016 FINDINGS: There is a persistent right pleural effusion with  consolidation in portions of the right middle and lower lobes. There is a minimal left pleural effusion. The left lung is otherwise clear. There is cardiomegaly with pulmonary venous hypertension. No adenopathy. There is aortic atherosclerosis. Patient is status post coronary artery bypass grafting. Bones are diffusely osteoporotic. IMPRESSION: Persistent fairly sizable right pleural effusion with consolidation in portions of the right middle and lower lobes. Small left pleural effusion. There is cardiomegaly with pulmonary venous hypertension indicative of pulmonary vascular congestion. There is aortic atherosclerosis. Appearance is essentially stable compared to recent prior study. Electronically Signed   By: Lowella Grip III M.D.   On: 12/13/2016 07:19   US Thoracentesis Asp Pleural Space W/img Guide  Result Date: 12/13/2016 INDICATION: Shortness of breath EXAM: ULTRASOUND GUIDED RIGHT THORACENTESIS MEDICATIONS: None. COMPLICATIONS: None immediate. PROCEDURE: An ultrasound guided thoracentesis was thoroughly discussed with the patient and questions answered. The benefits, risks, alternatives and complications were also discussed. The patient understands and wishes to proceed with the procedure. Written consent was obtained. Ultrasound was performed to localize and mark an adequate pocket of fluid in the right chest. The area was then prepped and draped in the normal sterile fashion. 1% Lidocaine was used for local anesthesia. Under ultrasound guidance a 6 Fr Safe-T-Centesis catheter was introduced. Thoracentesis was performed. The catheter was removed and a dressing applied. FINDINGS: A total of approximately 1000 mL of straw-colored fluid was removed. Samples were sent to the laboratory as requested by the clinical team. IMPRESSION: Successful ultrasound guided right thoracentesis yielding 1000 mL of pleural fluid. Electronically Signed   By: Kathreen Devoid   On: 12/13/2016 15:53   ASSESSMENT AND PLAN:    76 year old female with history of chronic diastolic heart failure and preserved ejection fraction, kidney transplant in 2001 on immunosuppressant medications, diabetes and PAF who presents with nausea, vomiting and generalized weakness and found to have acute kidney injury.  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 On IV diflucan Discussed with Dr. Vicente Males  EGD today. Start diet after EGD. Will stop diflucan if no candidiasis on EGD  * DM with hypoglycemia due to insulin and not eating Stopped levemir. Now blood sugars elevated likely due to IV steroids. Resume Levemir after EGD with diet  * Acute kidney injury on chronic kidney disease stage III - resolved  Prograf level elevated and dosing adjusted Appreciate nephrology consultation.  * Hypothermia: Possibly from hypoglycemia Resolved  * History of renal transplant on immunosuppressive medications: Continue medications   * Chronic diastolic CHF: Patient continues to have pleural effusion however is not symptomatic. Appreciate cardiology consult Torsemide held due to decreased oral intake.  * chronic hypoxic resp failure Due to chf/pleural effusions stable  * PAF: INR is elevated and Coumadin is on hold for now. In NSR Restart coumadin after EGD  * Hypothyroid: Continue Synthroid  * CAD Stable  * DVT prophylaxis.  On lovenox  Management plans discussed with the patient and she is in agreement.  CODE STATUS: FULL  TOTAL TIME TAKING CARE OF THIS PATIENT: 35 minutes.   Elizabeth [name] R M.D on 12/14/2016 at 12:12 PM  Between 7am to 6pm - Pager - [phone]  After 6pm go to [email or web address] - password [email or web address] [facility]  Office  [phone]  CC: Primary care physician;  Donetta Potts, MD  Note: This dictation was prepared with Dragon dictation along with smaller phrase technology. Any transcriptional errors that result from this process are unintentional.

## 2016-12-14 NOTE — Progress Notes (Signed)
Updated Dr. Gaetano Hawthorne, new orders for amio/heparin gtt. Updated night shift RN.

## 2016-12-14 NOTE — Progress Notes (Signed)
Patient had 2.04 pause. Patient asymptomatic, resting comfortably in bed. Hospitalist made aware via text page. No new orders at this time. Will continue to monitor.    Elizabeth [name] M

## 2016-12-14 NOTE — Anesthesia Post-op Follow-up Note (Cosign Needed)
Anesthesia QCDR form completed.        

## 2016-12-14 NOTE — Progress Notes (Signed)
Patient having runs of VT, up to 26 beats, HR 150-170. Patient still not feeling well. Dr. Juanna Cao updated, new orders to check Mg, BMP and EKG. Patient and spouse updated. Metoprolol has been given.

## 2016-12-14 NOTE — Progress Notes (Signed)
Nutrition Follow-up  DOCUMENTATION CODES:   Severe malnutrition in context of acute illness/injury  INTERVENTION:  1. Glucerna Shake po TID, each supplement provides 220 kcal and 10 grams of protein   NUTRITION DIAGNOSIS:   Malnutrition (Severe) related to acute illness (viral gastroenteritis) as evidenced by moderate depletion of body fat, severe depletion of muscle mass, moderate depletions of muscle mass. -ongoing  GOAL:   Patient will meet greater than or equal to 90% of their needs -not progressing  MONITOR:   PO intake, Supplement acceptance, I & O's, Labs, Weight trends  REASON FOR ASSESSMENT:   Malnutrition Screening Tool    ASSESSMENT:   Elizabeth [name]  is a 76 y.o. female with a known history of Hypertension, diabetes, chronic diastolic CHF, kidney transplant on immunosuppressants, CKD stage III, history of PE presents to the emergency room from home due to weakness, vomiting and diarrhea  Currently NPO underwent SLP eval and GI workup. Thoracentesis and EGD today. Has been vomiting food and regurgitating since admission. Dysphagia. 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 Labs and medications reviewed.  Diet Order:  Diet NPO time specified  Skin:  Reviewed, no issues  Last BM:  12/08/2016  Height:   Ht Readings from Last 1 Encounters:  12/07/16 5\' 3"  (1.6 m)    Weight:   Wt Readings from Last 1 Encounters:  12/14/16 121 lb 12.8 oz (55.2 kg)    Ideal Body Weight:  48.3 kg  BMI:  Body mass index is 21.58 kg/m.  Estimated Nutritional Needs:   Kcal:  1600-1900 calories  Protein:  70-80 gm  Fluid:  >/= 1.6L  EDUCATION NEEDS:   No education needs identified at this time  Satira Anis. Elizabeth Garramone, MS, RD LDN Inpatient Clinical Dietitian Pager [phone]

## 2016-12-14 NOTE — Progress Notes (Addendum)
[place] for Warfarin  Indication: atrial fibrillation  Allergies  Allergen Reactions  . Azithromycin Other (See Comments)    Upset stomach  . Erythromycin Nausea And Vomiting  . Fentanyl Nausea And Vomiting  . Percocet [Oxycodone-Acetaminophen] Nausea And Vomiting    Patient Measurements: Height: 5\' 3"  (160 cm) Weight: 121 lb 12.8 oz (55.2 kg) IBW/kg (Calculated) : 52.4  Vital Signs: Temp: 98.1 F (36.7 C) (04/25 0815) Temp Source: Oral (04/25 0815) BP: 181/42 (04/25 0815) Pulse Rate: 55 (04/25 0815)  Labs:  Recent Labs  12/12/16 0527 12/13/16 0540  HGB 9.9*  --   HCT 30.3*  --   PLT 181  --   LABPROT 30.3* 19.1*  INR 2.83 1.59  CREATININE 1.28*  --     Estimated Creatinine Clearance: 30.4 mL/min (A) (by C-G formula based on SCr of 1.28 mg/dL (H)).   Medical History: Past Medical History:  Diagnosis Date  . Adenomatous polyp   . Anemia   . 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 Atrial fibrillation ([place])    a. on Coumadin. No rate-controlling medications with baseline bradycardia.   . Bradycardia   . CAD (coronary artery disease)    a. s/p 1-vessel CABG to LCx in 2001  . Carotid artery stenosis    mild  . Carotid bruit    left  . Chronic kidney disease    s/p cadaveric renal transplant in 2001  . Colitis   . Compression fracture of L4 lumbar vertebra ([facility])   . Coronary artery disease   . CVA (cerebral infarction)   . Diabetes mellitus   . Diabetic retinopathy   . Esophageal candidiasis ([place])   . Flu 11/22/2016  . Fracture of right maxilla 09/21/2012  . Fx ankle   . Fx wrist   . Gastritis   . GERD (gastroesophageal reflux disease)   . H/O immunosuppressive therapy   . History of orthostatic hypotension   . Hyperlipidemia   . Hyperparathyroidism   . Hypothyroidism   . IBS (irritable bowel syndrome)   . Ischemic  colitis ([place])   . Lactose intolerance   . Leg ulcer ([place])    from poor fitting prosthetic  . Melanoma ([place])   . Nephropathy   . 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 Neuropathy   . Osteoporosis    recurrent fractures  . Pancreatic cyst   . Pneumonia 11/2016  . Pulmonary embolism ([place])   . PVD (peripheral vascular disease) ([place])   . Renal artery stenosis ([place])   . 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 Renal osteodystrophy   . Right orbit fracture ([place]) 09/21/2012  . Stroke ([place])   . TBI (traumatic brain injury) ([place]) 09/30/2012  . Tubular adenoma   . Zygoma fracture ([place]) 09/21/2012    Assessment: 76 yo female with PMH history of A. Fib.  Pharmacy consulted for warfarin dosing and monitoring. INR supratherapeutic at 5.51 on admission.   Home Regimen: Warfarin 5mg  Sun, Tues, Thurs                             Warfarin 2.5mg  [place], Fri, Sat   DATE  INR  DOSE 4/18  5.51  HELD 4/19  5.79  HELD 4/20  4.79  HELD 4/21  4.34                 HELD 4/22                 3.06                 HELD 4/23  2.83  HELD **1 mg subcutaneous Vitamin K 4/23 @ 11:19** 4/24  1.59  HELD 4/25  --  3 mg  Goal of Therapy:  INR 2-3 Monitor platelets by anticoagulation protocol: Yes   Plan:  EGD planned for this afternoon. Will resume VKA post procedure at reduced dose (~44% weekly reduction) due to supratherapeutic level and drug-drug interaction. Drug-drug interaction with fluconazole noted - the necessity of fluconazole will be determined after EGD this afternoon and may or may not continue. Will give 2 mg po this evening, check INR in AM, and re-evaluate dosing tomorrow.   Laural Benes, Pharm.D., BCPS Clinical Pharmacist 12/14/2016 11:31 AM

## 2016-12-14 NOTE — Progress Notes (Signed)
MD paged for pt's HR going down into the 50's and sustaining, pt now back in sinus. Dr. Ara Kussmaul gave the okay to turn the amiodarone drip off. Pt is asymptomatic, will continue to monitor. Conley Simmonds, RN, BSN

## 2016-12-14 NOTE — Progress Notes (Signed)
Treatment attempted this afternoon. Pt off floor at this time. Re attempt at a later time/date as the schedule and pt availability allows.   Larae Grooms, PTA

## 2016-12-14 NOTE — Progress Notes (Signed)
MD paged, pt only has 2  IV access, one 22g Left forearm which this amiodarone is going through and a 24g in the right thumb which barely flushes. Pt is to get a heparin gtt and also replace magnesium IV. PICC line order placed. So IV medications can be given.

## 2016-12-14 NOTE — H&P (Signed)
Jonathon Bellows MD [address], [place], [place] [address] Phone: [phone] Fax : [phone]  Primary Care Physician:  Donetta Potts, MD Primary Gastroenterologist:  Dr. Jonathon Bellows   Pre-Procedure History & Physical: HPI:  Elizabeth [name] is a 76 y.o. female is here for an endoscopy and possible dilation .   Past Medical History:  Diagnosis Date  . Adenomatous polyp   . Anemia   . 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 Atrial fibrillation ([place])    a. on Coumadin. No rate-controlling medications with baseline bradycardia.   . Bradycardia   . CAD (coronary artery disease)    a. s/p 1-vessel CABG to LCx in 2001  . Carotid artery stenosis    mild  . Carotid bruit    left  . Chronic kidney disease    s/p cadaveric renal transplant in 2001  . Colitis   . Compression fracture of L4 lumbar vertebra (HCC)   . Coronary artery disease   . CVA (cerebral infarction)   . Diabetes mellitus   . Diabetic retinopathy   . Esophageal candidiasis ([place])   . Flu 11/22/2016  . Fracture of right maxilla 09/21/2012  . Fx ankle   . Fx wrist   . Gastritis   . GERD (gastroesophageal reflux disease)   . H/O immunosuppressive therapy   . History of orthostatic hypotension   . Hyperlipidemia   . Hyperparathyroidism   . Hypothyroidism   . IBS (irritable bowel syndrome)   . Ischemic colitis ([place])   . Lactose intolerance   . Leg ulcer ([place])    from poor fitting prosthetic  . Melanoma ([place])   . Nephropathy   . 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 Neuropathy   . Osteoporosis    recurrent fractures  . Pancreatic cyst   . Pneumonia 11/2016  . Pulmonary embolism ([place])   . PVD (peripheral vascular disease) ([place])   . Renal artery stenosis ([place])   . 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 Renal osteodystrophy   . Right orbit fracture ([place]) 09/21/2012  . Stroke ([place])   .  TBI (traumatic brain injury) ([place]) 09/30/2012  . Tubular adenoma   . Zygoma fracture ([place]) 09/21/2012    Past Surgical History:  Procedure Laterality Date  . ABDOMINAL HYSTERECTOMY    . APPENDECTOMY    . BELOW KNEE LEG AMPUTATION     left, [place] joint from neuropathy  . CATARACT EXTRACTION    . CESAREAN SECTION     x 2  . CHOLECYSTECTOMY    . COLONOSCOPY  01/30/2012   Procedure: COLONOSCOPY;  Surgeon: Milus Banister, MD;  Location: [facility];  Service: Endoscopy;  Laterality: N/A;  . COLONOSCOPY W/ BIOPSIES AND POLYPECTOMY  02/18/2009   adenomatous polyps, diverticulosis, internal hemorrhoids  . CORONARY ARTERY BYPASS GRAFT    . ESOPHAGOGASTRODUODENOSCOPY  05/02/2012   Procedure: ESOPHAGOGASTRODUODENOSCOPY (EGD);  Surgeon: Gatha Mayer, MD;  Location: [facility];  Service: Endoscopy;  Laterality: N/A;  . EUS  02/04/2010   w/FNA, pancreatic cyst  . IR THORACENTESIS ASP PLEURAL SPACE W/IMG GUIDE  11/24/2016  . KIDNEY TRANSPLANT  08/15/2000   [facility] in [place]  . THORACENTESIS  07/2016, 08/2016, 09/2016   Ordered by Dr. Vella Kohler. Surg: Dr.Green, Dr. Anselm Pancoast  . THYROID SURGERY    . TONSILLECTOMY    . UPPER GASTROINTESTINAL ENDOSCOPY  12/01/2009  . VITRECTOMY     right eye    Prior to Admission medications   Medication Sig Start Date End Date Taking? Authorizing Provider  acetaminophen (TYLENOL) 500 MG tablet Take 1,000 mg by mouth every 6 (six) hours as needed for moderate pain or headache.    Yes Historical Provider, MD  atorvastatin (LIPITOR) 10 MG tablet Take 1 tablet (10 mg total) by mouth every other day. 10/12/12  Yes Daniel J Angiulli, PA-C  cholecalciferol (VITAMIN D) 1000 UNITS tablet Take 2,000 Units by mouth daily.    Yes Historical Provider, MD  docusate sodium (COLACE) 100 MG capsule Take 100 mg by mouth daily as needed for moderate constipation.    Yes Historical Provider, MD  escitalopram (LEXAPRO) 10 MG tablet Take 10 mg by mouth daily.   Yes Historical  Provider, MD  ferrous sulfate (SM IRON) 325 (65 FE) MG tablet Take 325 mg by mouth daily.    Yes Historical Provider, MD  HYDROcodone-acetaminophen (NORCO/VICODIN) 5-325 MG per tablet Take 2 tablets by mouth 2 (two) times daily as needed for moderate pain.    Yes Historical Provider, MD  hyoscyamine (ANASPAZ) 0.125 MG TBDP disintergrating tablet Place 0.125 mg under the tongue as needed for cramping.    Yes Historical Provider, MD  insulin glargine (LANTUS) 100 UNIT/ML injection Inject 7 Units into the skin every morning.  10/12/12  Yes Daniel J Angiulli, PA-C  insulin lispro (HUMALOG) 100 UNIT/ML injection Inject 2-9 Units into the skin 4 (four) times daily -  before meals and at bedtime. Per sliding scale.   Yes Historical Provider, MD  levothyroxine (SYNTHROID, LEVOTHROID) 112 MCG tablet Take 112 mcg by mouth daily before breakfast.   Yes Historical Provider, MD  magnesium gluconate (MAGONATE) 500 MG tablet Take 1,000 mg by mouth daily.    Yes Historical Provider, MD  mirtazapine (REMERON) 7.5 MG tablet Take 7.5 mg by mouth at bedtime.  02/15/13  Yes Historical Provider, MD  Multiple Vitamin (MULTIVITAMIN WITH MINERALS) TABS Take 1 tablet by mouth daily.   Yes Historical Provider, MD  mycophenolate (MYFORTIC) 360 MG TBEC Take 360 mg by mouth 2 (two) times daily.    Yes Historical Provider, MD  nitroGLYCERIN (NITROSTAT) 0.4 MG SL tablet Place 1 tablet (0.4 mg total) under the tongue every 5 (five) minutes as needed for chest pain. 11/02/12  Yes Minna Merritts, MD  omeprazole (PRILOSEC) 20 MG capsule Take 20 mg by mouth at bedtime.   Yes Historical Provider, MD  ondansetron (ZOFRAN) 4 MG tablet Take 4 mg by mouth every 8 (eight) hours as needed for nausea or vomiting.    Yes Historical Provider, MD  potassium chloride (K-DUR) 10 MEQ tablet Take 10 mEq by mouth daily at 12 noon.    Yes Historical Provider, MD  predniSONE (DELTASONE) 5 MG tablet Take 5 mg by mouth every morning.    Yes Historical  Provider, MD  promethazine (PHENERGAN) 25 MG tablet Take 1 tablet (25 mg total) by mouth every 6 (six) hours as needed for nausea or vomiting. 11/02/16  Yes Minna Merritts, MD  tacrolimus (PROGRAF) 0.5 MG capsule Take 1-2 mg by mouth See admin instructions. Take 2mg  in the am & 1mg  at night    Yes Historical Provider, MD  torsemide (DEMADEX) 20 MG tablet TAKE ONE TABLET BY MOUTH TWICE DAILY AS NEEDED Patient taking differently: Take 10 mg by mouth at bedtime alternating with 20 mg by mouth at bedtime. 05/23/16  Yes Christia Reading  Gloriajean Dell, MD  warfarin (COUMADIN) 2.5 MG tablet TAKE AS DIRECTED BY COUMADIN CLINIC Patient taking differently: Take 5 mg by mouth daily on Sunday, Tuesday and Thursday. Take 2.5 mg by mouth daily on all other days 11/11/16  Yes Minna Merritts, MD  midodrine (PROAMATINE) 10 MG tablet Take 1 tablet (10 mg total) by mouth 3 (three) times daily as needed. Patient not taking: Reported on 12/07/2016 06/18/14   Minna Merritts, MD    Allergies as of 12/07/2016 - Review Complete 12/07/2016  Allergen Reaction Noted  . Azithromycin Other (See Comments) 02/08/2016  . Erythromycin Nausea And Vomiting 11/19/2013  . Fentanyl Nausea And Vomiting 11/19/2013  . Percocet [oxycodone-acetaminophen] Nausea And Vomiting 01/27/2012    Family History  Problem Relation Age of Onset  . Stroke Mother 30  . Heart attack Mother   . Heart attack Father 23  . Breast cancer Sister   . Colon cancer Neg Hx   . Diabetes Neg Hx     Social History   Social History  . Marital status: Married    Spouse name: N/A  . Number of children: 2  . Years of education: N/A   Occupational History  . retired Marine scientist Retired   Social History Main Topics  . Smoking status: Never Smoker  . Smokeless tobacco: Never Used  . Alcohol use No  . Drug use: No  . Sexual activity: Not on file   Other Topics Concern  . Not on file   Social History Narrative  . No narrative on file    Review of Systems: See  HPI, otherwise negative ROS  Physical Exam: BP (!) 156/43   Pulse 92   Temp 97.8 F (36.6 C)   Resp 14   Ht 5\' 3"  (1.6 m)   Wt 121 lb (54.9 kg)   SpO2 100%   BMI 21.43 kg/m  General:   Alert,  pleasant and cooperative in NAD Head:  Normocephalic and atraumatic. Neck:  Supple; no masses or thyromegaly. Lungs:  Clear throughout to auscultation.    Heart:  Regular rate and rhythm. Abdomen:  Soft, nontender and nondistended. Normal bowel sounds, without guarding, and without rebound.   Neurologic:  Alert and  oriented x4;  grossly normal neurologically.  Impression/Plan: Elizabeth [name] is here for an endoscopy and possible dilation  to be performed for vomiting/dysphagia   Risks, benefits, limitations, and alternatives regarding  endoscopy and possible dilation  have been reviewed with the patient.  Questions have been answered.  All parties agreeable.   Jonathon Bellows, MD  12/14/2016, 3:09 PM

## 2016-12-14 NOTE — Progress Notes (Signed)
See endoscopy note for recommendations  ,Dr Jonathon Bellows  Gastroenterology/Hepatology Pager: [phone]

## 2016-12-14 NOTE — Anesthesia Preprocedure Evaluation (Signed)
Anesthesia Evaluation  Patient identified by MRN, date of birth, ID band Patient awake    Reviewed: Allergy & Precautions, NPO status , Patient's Chart, lab work & pertinent test results  History of Anesthesia Complications Negative for: history of anesthetic complications  Airway Mallampati: II  TM Distance: >3 FB Neck ROM: Full    Dental  (+) Missing, Poor Dentition   Pulmonary neg sleep apnea, neg COPD,    breath sounds clear to auscultation- rhonchi (-) wheezing      Cardiovascular hypertension, + CAD, + CABG (2001), + Peripheral Vascular Disease and +CHF (preserved EF)   Rhythm:Regular Rate:Normal - Systolic murmurs and - Diastolic murmurs Echo 4/0/81: NORMAL LEFT VENTRICULAR SYSTOLIC FUNCTION NORMAL RIGHT VENTRICULAR SYSTOLIC FUNCTION MODERATE VALVULAR REGURGITATION (Mod MR, mild AI) MODERATE VALVULAR STENOSIS (Mod MS, mild AS) Severe LAE   Neuro/Psych CVA negative psych ROS   GI/Hepatic Neg liver ROS, GERD  ,  Endo/Other  diabetes, Insulin DependentHypothyroidism   Renal/GU Renal disease (s/p renal transplant 2001)     Musculoskeletal negative musculoskeletal ROS (+)   Abdominal (+) - obese,   Peds  Hematology  (+) anemia ,   Anesthesia Other Findings Past Medical History: No date: Adenomatous polyp No date: Anemia 02/27/2012: Arteriosclerosis, mesenteric artery (HCC)     Comment: Duplex US shows >70% stenosis celiac and signs              of stenosis of SMA and IMA  No date: Atrial fibrillation (HCC)     Comment: a. on Coumadin. No rate-controlling               medications with baseline bradycardia.  No date: Bradycardia No date: CAD (coronary artery disease)     Comment: a. s/p 1-vessel CABG to LCx in 2001 No date: Carotid artery stenosis     Comment: mild No date: Carotid bruit     Comment: left No date: Chronic kidney disease     Comment: s/p cadaveric renal transplant in 2001 No date:  Colitis No date: Compression fracture of L4 lumbar vertebra ([facility]* No date: Coronary artery disease No date: CVA (cerebral infarction) No date: Diabetes mellitus No date: Diabetic retinopathy No date: Esophageal candidiasis ([place]) 11/22/2016: Flu 09/21/2012: Fracture of right maxilla No date: Fx ankle No date: Fx wrist No date: Gastritis No date: GERD (gastroesophageal reflux disease) No date: H/O immunosuppressive therapy No date: History of orthostatic hypotension No date: Hyperlipidemia No date: Hyperparathyroidism No date: Hypothyroidism No date: IBS (irritable bowel syndrome) No date: Ischemic colitis ([place]) No date: Lactose intolerance No date: Leg ulcer ([place])     Comment: from poor fitting prosthetic No date: Melanoma ([place]) No date: Nephropathy 09/04/2012: Neurogenic pain-esophagus No date: Neuropathy No date: Osteoporosis     Comment: recurrent fractures No date: Pancreatic cyst 11/2016: Pneumonia No date: Pulmonary embolism (HCC) No date: PVD (peripheral vascular disease) (HCC) No date: Renal artery stenosis (HCC) No date: Renal disease     Comment: 2ndary to diabetic nephropathy No date: Renal osteodystrophy 09/21/2012: Right orbit fracture ([place]) No date: Stroke [facility]) 09/30/2012: TBI (traumatic brain injury) ([place]) No date: Tubular adenoma 09/21/2012: Zygoma fracture ([place])   Reproductive/Obstetrics                             Anesthesia Physical Anesthesia Plan  ASA: IV  Anesthesia Plan: General   Post-op Pain Management:    Induction: Intravenous  Airway Management Planned: Natural Airway  Additional Equipment:  Intra-op Plan:   Post-operative Plan:   Informed Consent: I have reviewed the patients History and Physical, chart, labs and discussed the procedure including the risks, benefits and alternatives for the proposed anesthesia with the patient or authorized representative who has indicated his/her understanding and  acceptance.   Dental advisory given  Plan Discussed with: CRNA and Anesthesiologist  Anesthesia Plan Comments:         Anesthesia Quick Evaluation

## 2016-12-15 ENCOUNTER — Encounter: Payer: Self-pay | Admitting: Gastroenterology

## 2016-12-15 DIAGNOSIS — I4891 Unspecified atrial fibrillation: Secondary | ICD-10-CM

## 2016-12-15 LAB — HEPARIN LEVEL (UNFRACTIONATED)
HEPARIN UNFRACTIONATED: 0.81 [IU]/mL — AB (ref 0.30–0.70)
HEPARIN UNFRACTIONATED: 0.56 [IU]/mL (ref 0.30–0.70)
Heparin Unfractionated: 0.67 IU/mL (ref 0.30–0.70)

## 2016-12-15 LAB — GLUCOSE, CAPILLARY
GLUCOSE-CAPILLARY: 137 mg/dL — AB (ref 65–99)
GLUCOSE-CAPILLARY: 108 mg/dL — AB (ref 65–99)
GLUCOSE-CAPILLARY: 146 mg/dL — AB (ref 65–99)
GLUCOSE-CAPILLARY: 129 mg/dL — AB (ref 65–99)
Glucose-Capillary: 200 mg/dL — ABNORMAL HIGH (ref 65–99)

## 2016-12-15 LAB — RENAL FUNCTION PANEL
ALBUMIN: 2.4 g/dL — AB (ref 3.5–5.0)
Anion gap: 6 (ref 5–15)
BUN: 36 mg/dL — AB (ref 6–20)
CALCIUM: 8.4 mg/dL — AB (ref 8.9–10.3)
CO2: 32 mmol/L (ref 22–32)
CREATININE: 1.68 mg/dL — AB (ref 0.44–1.00)
Chloride: 99 mmol/L — ABNORMAL LOW (ref 101–111)
GFR calc Af Amer: 33 mL/min — ABNORMAL LOW (ref >60)
GFR, EST NON AFRICAN AMERICAN: 28 mL/min — AB (ref >60)
GLUCOSE: 179 mg/dL — AB (ref 65–99)
POTASSIUM: 3.6 mmol/L (ref 3.5–5.1)
Phosphorus: 3 mg/dL (ref 2.5–4.6)
SODIUM: 137 mmol/L (ref 135–145)

## 2016-12-15 LAB — CBC
HCT: 28.2 % — ABNORMAL LOW (ref 35.0–47.0)
Hemoglobin: 9.4 g/dL — ABNORMAL LOW (ref 12.0–16.0)
MCH: 31.1 pg (ref 26.0–34.0)
MCHC: 33.5 g/dL (ref 32.0–36.0)
MCV: 92.7 fL (ref 80.0–100.0)
PLATELETS: 134 10*3/uL — AB (ref 150–440)
RBC: 3.04 MIL/uL — ABNORMAL LOW (ref 3.80–5.20)
RDW: 16.3 % — AB (ref 11.5–14.5)
WBC: 4 10*3/uL (ref 3.6–11.0)

## 2016-12-15 LAB — PROTIME-INR
INR: 1.22
Prothrombin Time: 15.5 seconds — ABNORMAL HIGH (ref 11.4–15.2)

## 2016-12-15 MED ORDER — PANTOPRAZOLE SODIUM 40 MG PO TBEC
40.0000 mg | DELAYED_RELEASE_TABLET | Freq: Two times a day (BID) | ORAL | Status: DC
  Administered 2016-12-16 (×2): 40 mg via ORAL
  Filled 2016-12-15 (×2): qty 1

## 2016-12-15 MED ORDER — FLUCONAZOLE 100 MG PO TABS
100.0000 mg | ORAL_TABLET | Freq: Every day | ORAL | Status: DC
  Administered 2016-12-15 – 2016-12-16 (×2): 100 mg via ORAL
  Filled 2016-12-15 (×2): qty 1

## 2016-12-15 MED ORDER — WARFARIN SODIUM 5 MG PO TABS
2.5000 mg | ORAL_TABLET | Freq: Every day | ORAL | Status: DC
  Administered 2016-12-15 – 2016-12-16 (×2): 2.5 mg via ORAL
  Filled 2016-12-15 (×2): qty 1

## 2016-12-15 MED ORDER — SODIUM CHLORIDE 0.9 % IV SOLN
INTRAVENOUS | Status: DC
  Administered 2016-12-15: 11:00:00 via INTRAVENOUS

## 2016-12-15 NOTE — Progress Notes (Signed)
Subjective:  High tacrolimus levels likely related to use of Diflucan. Tacrolimus doseage has been decreased once.    Objective:  Vital signs in last 24 hours:  Temp:  [97 F (36.1 C)-98.2 F (36.8 C)] 97.8 F (36.6 C) (04/26 1411) Pulse Rate:  [33-148] 52 (04/26 1411) Resp:  [14-31] 14 (04/26 1411) BP: (76-145)/(26-88) 121/88 (04/26 1411) SpO2:  [94 %-100 %] 94 % (04/26 1411) Weight:  [54.9 kg (121 lb)] 54.9 kg (121 lb) (04/26 0445)  Weight change: -0.363 kg (-12.8 oz) Filed Weights   12/14/16 0420 12/14/16 1416 12/15/16 0445  Weight: 55.2 kg (121 lb 12.8 oz) 54.9 kg (121 lb) 54.9 kg (121 lb)    Intake/Output:    Intake/Output Summary (Last 24 hours) at 12/15/16 1422 Last data filed at 12/15/16 1226  Gross per 24 hour  Intake          1907.74 ml  Output              400 ml  Net          1507.74 ml     Physical Exam: General: Chronically ill-appearing, lying in the bed  HEENT Anicteric, moist oral mucous membranes  Neck supple  Pulm/lungs Mild basilar crackles at left base  CVS/Heart Irregular, no rubs  Abdomen:  Soft, nontender, BS present  Extremities: + dependent edema  Neurologic: Alert, able to answer questions  Skin: Scattered ecchymoses          Basic Metabolic Panel:   Recent Labs Lab 12/10/16 0617 12/11/16 0625 12/12/16 0527 12/14/16 1835 12/15/16 0528  NA 135 134* 138 137 137  K 5.3* 5.3* 4.6 3.7 3.6  CL 103 101 103 101 99*  CO2 28 29 30 29  32  GLUCOSE 181* 224* 205* 187* 179*  BUN 35* 30* 29* 34* 36*  CREATININE 1.56* 1.20* 1.28* 1.51* 1.68*  CALCIUM 8.4* 8.5* 8.7* 8.6* 8.4*  MG  --  2.3  --  1.4*  --   PHOS  --   --   --   --  3.0     CBC:  Recent Labs Lab 12/10/16 0617 12/11/16 0625 12/12/16 0527 12/14/16 2002 12/15/16 0528  WBC 3.2* 4.1 3.8 4.0 4.0  NEUTROABS  --  3.6 3.3  --   --   HGB 9.5* 10.5* 9.9* 9.4* 9.4*  HCT 29.2* 31.8* 30.3* 27.6* 28.2*  MCV 88.0 89.7 90.1 96.9 92.7  PLT 188 185 181 148* 134*       Microbiology:  Recent Results (from the past 720 hour(s))  Susceptibility, Aer + Anaerob     Status: None   Collection Time: 11/22/16 12:00 AM  Result Value Ref Range Status   Suscept, Aer + Anaerob Final report  Corrected    Comment: (NOTE) Performed At: [facility] [place], [place] [id number] Lindon Romp MD MG:[id number] CORRECTED ON 04/13 AT 2671: PREVIOUSLY REPORTED AS Preliminary report    Source of Sample URINE/ ENTEROCOCCUS FAECIUM SUSCEPTIBILITY  Final  Susceptibility Result     Status: None   Collection Time: 11/22/16 12:00 AM  Result Value Ref Range Status   Suscept Result 1 Enterococcus faecium  Final    Comment: (NOTE) Identification performed by account, not confirmed by this laboratory. Ciprofloxacin     >2      Resistant Nitrofurantoin    64      Intermediate Levofloxacin      >4      Resistant Tetracycline      >  8      Resistant Rifampin         <=1      Susceptible    Antimicrobial Suscept Comment  Final    Comment: (NOTE)      ** S = Susceptible; I = Intermediate; R = Resistant **                   P = Positive; N = Negative            MICS are expressed in micrograms per mL   Antibiotic                 RSLT#1    RSLT#2    RSLT#3    RSLT#4 Penicillin                     R Vancomycin                     R Performed At: [facility] [place], [place] [id number] Lindon Romp MD BS:[id number]   Blood culture (routine x 2)     Status: None   Collection Time: 11/22/16 12:52 PM  Result Value Ref Range Status   Specimen Description BLOOD RIGHT ANTECUBITAL  Final   Special Requests   Final    BOTTLES DRAWN AEROBIC AND ANAEROBIC Blood Culture adequate volume   Culture NO GROWTH 5 DAYS  Final   Report Status 11/27/2016 FINAL  Final  Blood culture (routine x 2)     Status: None   Collection Time: 11/22/16 12:54 PM  Result Value Ref Range Status   Specimen Description BLOOD RIGHT FOREARM  Final    Special Requests   Final    BOTTLES DRAWN AEROBIC ONLY Blood Culture adequate volume   Culture NO GROWTH 5 DAYS  Final   Report Status 11/27/2016 FINAL  Final  Urine culture     Status: Abnormal   Collection Time: 11/22/16  3:44 PM  Result Value Ref Range Status   Specimen Description URINE, CATHETERIZED  Final   Special Requests NONE  Final   Culture (A)  Final    80,000 COLONIES/mL ENTEROCOCCUS FAECIUM SEE SEPARATE REPORT FOR SUSCEPTIBILITY RESULTS Performed at [place], READ BACK BY AND VERIFIED WITH: R SAWYER,RN AT 1221 11/26/16 BY L BENFIELD CONCERNING DELAY IN RESULTS    Report Status 12/05/2016 FINAL  Final  Gram stain     Status: None   Collection Time: 11/24/16  1:06 PM  Result Value Ref Range Status   Specimen Description Pleural R  Final   Special Requests NONE  Final   Gram Stain   Final    CYTOSPIN SMEAR WBC PRESENT,BOTH PMN AND MONONUCLEAR NO ORGANISMS SEEN    Report Status 11/24/2016 FINAL  Final  Culture, body fluid-bottle     Status: None   Collection Time: 11/24/16  1:06 PM  Result Value Ref Range Status   Specimen Description PLEURAL  Final   Special Requests NONE  Final   Culture NO GROWTH 5 DAYS  Final   Report Status 11/29/2016 FINAL  Final  MRSA PCR Screening     Status: None   Collection Time: 11/24/16  6:04 PM  Result Value Ref Range Status   MRSA by PCR NEGATIVE NEGATIVE Final    Comment:        The GeneXpert MRSA Assay (FDA approved for NASAL specimens only), is one component of a comprehensive MRSA  colonization surveillance program. It is not intended to diagnose MRSA infection nor to guide or monitor treatment for MRSA infections.   Culture, blood (routine x 2)     Status: None   Collection Time: 12/07/16  6:40 PM  Result Value Ref Range Status   Specimen Description BLOOD LEFT HAND  Final   Special Requests   Final    BOTTLES DRAWN AEROBIC AND ANAEROBIC Blood Culture results may not be optimal due to an  excessive volume of blood received in culture bottles   Culture NO GROWTH 5 DAYS  Final   Report Status 12/12/2016 FINAL  Final  Culture, blood (routine x 2)     Status: Abnormal   Collection Time: 12/07/16  6:40 PM  Result Value Ref Range Status   Specimen Description BLOOD  R AC  Final   Special Requests BOTTLES DRAWN AEROBIC AND ANAEROBIC BCAV  Final   Culture  Setup Time   Final    GRAM POSITIVE COCCI AEROBIC BOTTLE ONLY CRITICAL RESULT CALLED TO, READ BACK BY AND VERIFIED WITH: Matt McBane @ [phone] 12/09/16 by [facility]    Culture (A)  Final    STAPHYLOCOCCUS SPECIES (COAGULASE NEGATIVE) THE SIGNIFICANCE OF ISOLATING THIS ORGANISM FROM A SINGLE SET OF BLOOD CULTURES WHEN MULTIPLE SETS ARE DRAWN IS UNCERTAIN. PLEASE NOTIFY THE MICROBIOLOGY DEPARTMENT WITHIN ONE WEEK IF SPECIATION AND SENSITIVITIES ARE REQUIRED. Performed at [facility], [place] [address] [place], [place] [address]    Report Status 12/11/2016 FINAL  Final  Blood Culture ID Panel (Reflexed)     Status: None   Collection Time: 12/07/16  6:40 PM  Result Value Ref Range Status   Enterococcus species NOT DETECTED NOT DETECTED Final   Listeria monocytogenes NOT DETECTED NOT DETECTED Final   Staphylococcus species NOT DETECTED NOT DETECTED Final   Staphylococcus aureus NOT DETECTED NOT DETECTED Final   Streptococcus species NOT DETECTED NOT DETECTED Final   Streptococcus agalactiae NOT DETECTED NOT DETECTED Final   Streptococcus pneumoniae NOT DETECTED NOT DETECTED Final   Streptococcus pyogenes NOT DETECTED NOT DETECTED Final   Acinetobacter baumannii NOT DETECTED NOT DETECTED Final   Enterobacteriaceae species NOT DETECTED NOT DETECTED Final   Enterobacter cloacae complex NOT DETECTED NOT DETECTED Final   Escherichia coli NOT DETECTED NOT DETECTED Final   Klebsiella oxytoca NOT DETECTED NOT DETECTED Final   Klebsiella pneumoniae NOT DETECTED NOT DETECTED Final   Proteus species NOT DETECTED NOT DETECTED Final    Serratia marcescens NOT DETECTED NOT DETECTED Final   Haemophilus influenzae NOT DETECTED NOT DETECTED Final   Neisseria meningitidis NOT DETECTED NOT DETECTED Final   Pseudomonas aeruginosa NOT DETECTED NOT DETECTED Final   Candida albicans NOT DETECTED NOT DETECTED Final   Candida glabrata NOT DETECTED NOT DETECTED Final   Candida krusei NOT DETECTED NOT DETECTED Final   Candida parapsilosis NOT DETECTED NOT DETECTED Final   Candida tropicalis NOT DETECTED NOT DETECTED Final  Culture, blood (Routine X 2) w Reflex to ID Panel     Status: None   Collection Time: 12/09/16  9:29 AM  Result Value Ref Range Status   Specimen Description BLOOD RIGHT HAND  Final   Special Requests   Final    BOTTLES DRAWN AEROBIC AND ANAEROBIC Blood Culture adequate volume   Culture NO GROWTH 5 DAYS  Final   Report Status 12/14/2016 FINAL  Final  Culture, blood (Routine X 2) w Reflex to ID Panel     Status: None   Collection  Time: 12/09/16 11:51 AM  Result Value Ref Range Status   Specimen Description BLOOD RIGHT HAND  Final   Special Requests   Final    BOTTLES DRAWN AEROBIC AND ANAEROBIC Blood Culture adequate volume   Culture NO GROWTH 5 DAYS  Final   Report Status 12/14/2016 FINAL  Final    Coagulation Studies:  Recent Labs  12/13/16 0540 12/15/16 0528  LABPROT 19.1* 15.5*  INR 1.59 1.22    Urinalysis: No results for input(s): COLORURINE, LABSPEC, PHURINE, GLUCOSEU, HGBUR, BILIRUBINUR, KETONESUR, PROTEINUR, UROBILINOGEN, NITRITE, LEUKOCYTESUR in the last 72 hours.  Invalid input(s): APPERANCEUR    Imaging: Dg Chest 1 View  Result Date: 12/13/2016 CLINICAL DATA:  Right thoracentesis. EXAM: CHEST 1 VIEW COMPARISON:  12/13/2016 FINDINGS: Small bilateral pleural effusions. Mild right basilar atelectasis. No pneumothorax. Stable cardiomegaly. Prior CABG. No acute osseous abnormality. IMPRESSION: Small bilateral pleural effusions. No right pneumothorax status post thoracentesis. Electronically  Signed   By: Kathreen Devoid   On: 12/13/2016 15:54   Dg Chest Port 1 View  Result Date: 12/14/2016 CLINICAL DATA:  76 year old female with PICC line placement. EXAM: PORTABLE CHEST 1 VIEW COMPARISON:  Chest radiograph dated 12/13/2016 FINDINGS: There has been interval placement of a right-sided PICC with tip over the right atrium close to the cavoatrial junction. Small bilateral pleural effusions, right greater left with associated compressive atelectasis of the lung bases. Pneumonia is not excluded. There is no pneumothorax. Stable cardiac silhouette. Postsurgical changes of CABG. There is atherosclerotic calcification of the aorta. No acute osseous pathology. IMPRESSION: 1. Interval placement of a right-sided PICC with tip over the right atrium close to the cavoatrial junction. No pneumothorax. 2. Cardiomegaly with small bilateral pleural effusions and associated partial compressive atelectasis versus infiltrate of the lung bases. 3. Atherosclerotic calcification of the aorta. Electronically Signed   By: Anner Crete M.D.   On: 12/14/2016 22:51   US Thoracentesis Asp Pleural Space W/img Guide  Result Date: 12/13/2016 INDICATION: Shortness of breath EXAM: ULTRASOUND GUIDED RIGHT THORACENTESIS MEDICATIONS: None. COMPLICATIONS: None immediate. PROCEDURE: An ultrasound guided thoracentesis was thoroughly discussed with the patient and questions answered. The benefits, risks, alternatives and complications were also discussed. The patient understands and wishes to proceed with the procedure. Written consent was obtained. Ultrasound was performed to localize and mark an adequate pocket of fluid in the right chest. The area was then prepped and draped in the normal sterile fashion. 1% Lidocaine was used for local anesthesia. Under ultrasound guidance a 6 Fr Safe-T-Centesis catheter was introduced. Thoracentesis was performed. The catheter was removed and a dressing applied. FINDINGS: A total of approximately  1000 mL of straw-colored fluid was removed. Samples were sent to the laboratory as requested by the clinical team. IMPRESSION: Successful ultrasound guided right thoracentesis yielding 1000 mL of pleural fluid. Electronically Signed   By: Kathreen Devoid   On: 12/13/2016 15:53     Medications:   . sodium chloride 50 mL/hr at 12/15/16 1111  . heparin 650 Units/hr (12/15/16 0824)   . atorvastatin  10 mg Oral QODAY  . bacitracin   Topical BID  . escitalopram  10 mg Oral Daily  . feeding supplement (GLUCERNA SHAKE)  237 mL Oral TID BM  . fluconazole  100 mg Oral Daily  . insulin aspart  0-9 Units Subcutaneous TID AC & HS  . insulin detemir  5 Units Subcutaneous QHS  . levothyroxine  112 mcg Oral QAC breakfast  . mouth rinse  15 mL Mouth Rinse BID  .  metoprolol tartrate  12.5 mg Oral BID  . mirtazapine  7.5 mg Oral QHS  . mycophenolate  360 mg Oral BID  . pantoprazole (PROTONIX) IV  40 mg Intravenous Q12H  . predniSONE  10 mg Oral Q breakfast  . sodium chloride flush  10-40 mL Intracatheter Q12H  . sodium chloride flush  3 mL Intravenous Q12H  . tacrolimus  1 mg Oral BID  . warfarin  2.5 mg Oral q1800  . Warfarin - Pharmacist Dosing Inpatient   Does not apply q1800   acetaminophen **OR** acetaminophen, albuterol, HYDROcodone-acetaminophen, hyoscyamine, ondansetron **OR** ondansetron (ZOFRAN) IV, polyethylene glycol, sodium chloride flush  Assessment/ Plan:  76 y.o.caucasian female with medical problems of Coronary disease with CABG in 11/28/1999, end-stage renal disease, was on dialysis for 5 years and got deceased donor kidney transplant  in Nov 28, 1999, stroke x3 with last in 1999/11/28, type 2 diabetes, Atrial fibrillation, peripheral vascular disease with left BKA, right upper extremity DVT requiring warfarin, who was admitted to [facility] on 12/07/2016 for evaluation of weakness, vomiting and diarrhea. Patient was hospitalized 2 weeks ago for pneumonia.  She was positive for influenza B at that time.  1.   Acute kidney injury  2. Chronic kidney disease st  3 Baseline creatinine 1.45 from April 12 3.  Renal transplant status. Deceased donor 11/28/99. Followed by Dr Arty Baumgartner in [place] 4.  Hyperkalemia  Plan:  Tacrolimus level has likely been high secondary to fluconazole.  Tacrolimus dosage has been decreased once already this admission.  We are awaiting repeat tacrolimus dosage today.  If still high tomorrow we will consider decreasing tacrolimus to 0.5 mg by mouth twice a day.  Otherwise continue to monitor renal function closely.  We have started the patient on IV fluid hydration today as her renal function was found to be a bit worse  We will continue to monitor her progress.    LOS: 8 Elizabeth [name] 4/26/20182:22 PM

## 2016-12-15 NOTE — Care Management (Signed)
Patient went into A fib and now off  amiodarone drip.  She is tolerating po liquids and no vomiting.  Patient has not been available for physical therapy treatments 4/24 or 4/25. Discussed during progression that her husband has stated she has to be able to pivot inorder for him to take her home.  This issue alone would not give medical necessity for continued stay.

## 2016-12-15 NOTE — Progress Notes (Signed)
[place] for Warfarin  Indication: atrial fibrillation  Allergies  Allergen Reactions  . Azithromycin Other (See Comments)    Upset stomach  . Erythromycin Nausea And Vomiting  . Fentanyl Nausea And Vomiting  . Percocet [Oxycodone-Acetaminophen] Nausea And Vomiting    Patient Measurements: Height: 5\' 3"  (160 cm) Weight: 121 lb (54.9 kg) IBW/kg (Calculated) : 52.4  Vital Signs: Temp: 97.8 F (36.6 C) (04/26 0754) Temp Source: Oral (04/26 0754) BP: 114/63 (04/26 0754) Pulse Rate: 51 (04/26 0754)  Labs:  Recent Labs  12/13/16 0540 12/14/16 1835 12/14/16 2002 12/15/16 0528  HGB  --   --  9.4* 9.4*  HCT  --   --  27.6* 28.2*  PLT  --   --  148* 134*  APTT  --   --  30  --   LABPROT 19.1*  --   --  15.5*  INR 1.59  --   --  1.22  HEPARINUNFRC  --   --   --  0.81*  CREATININE  --  1.51*  --  1.68*    Estimated Creatinine Clearance: 23.2 mL/min (A) (by C-G formula based on SCr of 1.68 mg/dL (H)).   Medical History: Past Medical History:  Diagnosis Date  . Adenomatous polyp   . Anemia   . 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 Atrial fibrillation ([place])    a. on Coumadin. No rate-controlling medications with baseline bradycardia.   . Bradycardia   . CAD (coronary artery disease)    a. s/p 1-vessel CABG to LCx in 2001  . Carotid artery stenosis    mild  . Carotid bruit    left  . Chronic kidney disease    s/p cadaveric renal transplant in 2001  . Colitis   . Compression fracture of L4 lumbar vertebra ([facility])   . Coronary artery disease   . CVA (cerebral infarction)   . Diabetes mellitus   . Diabetic retinopathy   . Esophageal candidiasis ([place])   . Flu 11/22/2016  . Fracture of right maxilla 09/21/2012  . Fx ankle   . Fx wrist   . Gastritis   . GERD (gastroesophageal reflux disease)   . H/O immunosuppressive therapy   .  History of orthostatic hypotension   . Hyperlipidemia   . Hyperparathyroidism   . Hypothyroidism   . IBS (irritable bowel syndrome)   . Ischemic colitis ([place])   . Lactose intolerance   . Leg ulcer ([place])    from poor fitting prosthetic  . Melanoma ([place])   . Nephropathy   . 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 Neuropathy   . Osteoporosis    recurrent fractures  . Pancreatic cyst   . Pneumonia 11/2016  . Pulmonary embolism ([place])   . PVD (peripheral vascular disease) ([place])   . Renal artery stenosis ([place])   . 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 Renal osteodystrophy   . Right orbit fracture ([place]) 09/21/2012  . Stroke ([place])   . TBI (traumatic brain injury) ([place]) 09/30/2012  . Tubular adenoma   . Zygoma fracture ([place]) 09/21/2012    Assessment: 76 yo female with PMH history of A. Fib.  Pharmacy consulted for warfarin dosing and monitoring. INR supratherapeutic at 5.51 on admission.   Home Regimen: Warfarin 5mg  Sun, Tues, Thurs  Warfarin 2.5mg  [place], Fri, Sat   DATE  INR  DOSE 4/18  5.51  HELD 4/19  5.79  HELD 4/20  4.79  HELD 4/21                 4.34                 HELD 4/22                 3.06                 HELD 4/23  2.83  HELD **1 mg subcutaneous Vitamin K 4/23 @ 11:19** 4/24  1.59  HELD 4/25  --  2 mg 4/26   1.22  2.5 mg  Goal of Therapy:  INR 2-3 Monitor platelets by anticoagulation protocol: Yes   Plan:  EGD was inconclusive in regards to candida - patient will remain on fluconazole for now. INR subtherapeutic - will increase dose slightly to 2.5 mg po daily and monitor INR daily while in patient.  Laural Benes, Pharm.D., BCPS Clinical Pharmacist 12/15/2016 8:11 AM

## 2016-12-15 NOTE — Progress Notes (Signed)
ANTICOAGULATION CONSULT NOTE - Initial Consult  Pharmacy Consult for Heparin  Indication: rapid heart beat        Allergies  Allergen Reactions  . Azithromycin Other (See Comments)    Upset stomach  . Erythromycin Nausea And Vomiting  . Fentanyl Nausea And Vomiting  . Percocet [Oxycodone-Acetaminophen] Nausea And Vomiting    Patient Measurements: Height: 5\' 3"  (160 cm) Weight: 121 lb (54.9 kg) IBW/kg (Calculated) : 52.4 Heparin Dosing Weight:   Vital Signs: Temp: 98.2 F (36.8 C) (04/25 1923) Temp Source: Oral (04/25 1923) BP: 79/34 (04/25 1948) Pulse Rate: 87 (04/25 1948)  Labs:  Recent Labs (last 2 labs)    Recent Labs  12/12/16 0527 12/13/16 0540 12/14/16 1835  HGB 9.9* --  --   HCT 30.3* --  --   PLT 181 --  --   LABPROT 30.3* 19.1* --   INR 2.83 1.59 --   CREATININE 1.28* --  1.51*      Estimated Creatinine Clearance: 25.8 mL/min (A) (by C-G formula based on SCr of 1.51 mg/dL (H)).   Medical History:     Past Medical History:  Diagnosis Date  . Adenomatous polyp   . Anemia   . 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 Atrial fibrillation ([place])    a. on Coumadin. No rate-controlling medications with baseline bradycardia.   . Bradycardia   . CAD (coronary artery disease)    a. s/p 1-vessel CABG to LCx in 2001  . Carotid artery stenosis    mild  . Carotid bruit    left  . Chronic kidney disease    s/p cadaveric renal transplant in 2001  . Colitis   . Compression fracture of L4 lumbar vertebra ([facility])   . Coronary artery disease   . CVA (cerebral infarction)   . Diabetes mellitus   . Diabetic retinopathy   . Esophageal candidiasis ([place])   . Flu 11/22/2016  . Fracture of right maxilla 09/21/2012  . Fx ankle   . Fx wrist   . Gastritis   . GERD (gastroesophageal reflux disease)   . H/O  immunosuppressive therapy   . History of orthostatic hypotension   . Hyperlipidemia   . Hyperparathyroidism   . Hypothyroidism   . IBS (irritable bowel syndrome)   . Ischemic colitis ([place])   . Lactose intolerance   . Leg ulcer ([place])    from poor fitting prosthetic  . Melanoma ([place])   . Nephropathy   . 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 Neuropathy   . Osteoporosis    recurrent fractures  . Pancreatic cyst   . Pneumonia 11/2016  . Pulmonary embolism ([place])   . PVD (peripheral vascular disease) ([place])   . Renal artery stenosis ([place])   . 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 Renal osteodystrophy   . Right orbit fracture ([place]) 09/21/2012  . Stroke ([place])   . TBI (traumatic brain injury) ([place]) 09/30/2012  . Tubular adenoma   . Zygoma fracture ([place]) 09/21/2012    Medications:         Prescriptions Prior to Admission  Medication Sig Dispense Refill Last Dose  . acetaminophen (TYLENOL) 500 MG tablet Take 1,000 mg by mouth every 6 (six) hours as needed for moderate pain or headache.    prn at prn  . atorvastatin (LIPITOR) 10 MG tablet Take 1 tablet (10 mg total) by  mouth every other day. 30 tablet 1 12/06/2016 at pm  . cholecalciferol (VITAMIN D) 1000 UNITS tablet Take 2,000 Units by mouth daily.    12/07/2016 at 1200  . docusate sodium (COLACE) 100 MG capsule Take 100 mg by mouth daily as needed for moderate constipation.    prn at prn  . escitalopram (LEXAPRO) 10 MG tablet Take 10 mg by mouth daily.   12/07/2016 at am  . ferrous sulfate (SM IRON) 325 (65 FE) MG tablet Take 325 mg by mouth daily.    12/06/2016 at late afternoon  . HYDROcodone-acetaminophen (NORCO/VICODIN) 5-325 MG per tablet Take 2 tablets by mouth 2 (two) times daily as needed for moderate pain.    prn at prn  . hyoscyamine (ANASPAZ) 0.125 MG TBDP disintergrating tablet Place 0.125 mg under the tongue as needed for cramping.    prn at prn  . insulin glargine  (LANTUS) 100 UNIT/ML injection Inject 7 Units into the skin every morning.    12/07/2016 at am  . insulin lispro (HUMALOG) 100 UNIT/ML injection Inject 2-9 Units into the skin 4 (four) times daily - before meals and at bedtime. Per sliding scale.   prn at prn  . levothyroxine (SYNTHROID, LEVOTHROID) 112 MCG tablet Take 112 mcg by mouth daily before breakfast.   12/07/2016 at am  . magnesium gluconate (MAGONATE) 500 MG tablet Take 1,000 mg by mouth daily.    12/06/2016 at late afternoon  . mirtazapine (REMERON) 7.5 MG tablet Take 7.5 mg by mouth at bedtime.    12/06/2016 at qhs   . Multiple Vitamin (MULTIVITAMIN WITH MINERALS) TABS Take 1 tablet by mouth daily.   12/07/2016 at 1200  . mycophenolate (MYFORTIC) 360 MG TBEC Take 360 mg by mouth 2 (two) times daily.    12/07/2016 at am  . nitroGLYCERIN (NITROSTAT) 0.4 MG SL tablet Place 1 tablet (0.4 mg total) under the tongue every 5 (five) minutes as needed for chest pain. 25 tablet 3 prn at prn  . omeprazole (PRILOSEC) 20 MG capsule Take 20 mg by mouth at bedtime.   12/06/2016 at qhs   . ondansetron (ZOFRAN) 4 MG tablet Take 4 mg by mouth every 8 (eight) hours as needed for nausea or vomiting.    12/07/2016 at 1000  . potassium chloride (K-DUR) 10 MEQ tablet Take 10 mEq by mouth daily at 12 noon.    12/07/2016 at 1200  . predniSONE (DELTASONE) 5 MG tablet Take 5 mg by mouth every morning.    12/07/2016 at am  . promethazine (PHENERGAN) 25 MG tablet Take 1 tablet (25 mg total) by mouth every 6 (six) hours as needed for nausea or vomiting. 30 tablet 1 prn at prn  . tacrolimus (PROGRAF) 0.5 MG capsule Take 1-2 mg by mouth See admin instructions. Take 2mg  in the am &1mg  at night    12/07/2016 at am  . torsemide (DEMADEX) 20 MG tablet TAKE ONE TABLET BY MOUTH TWICE DAILY AS NEEDED (Patient taking differently: Take 10 mg by mouth at bedtime alternating with 20 mg by mouth at bedtime.) 180 tablet 2 Past Week at Unknown time  . warfarin  (COUMADIN) 2.5 MG tablet TAKE AS DIRECTED BY COUMADIN CLINIC (Patient taking differently: Take 5 mg by mouth daily on Sunday, Tuesday and Thursday. Take 2.5 mg by mouth daily on all other days) 150 tablet 1 12/06/2016 at pm  . midodrine (PROAMATINE) 10 MG tablet Take 1 tablet (10 mg total) by mouth 3 (three) times daily as needed. (Patient  not taking: Reported on 12/07/2016) 90 tablet 6 Not Taking at prn   Scheduled:  . amiodarone 150 mg Intravenous Once  . atorvastatin 10 mg Oral QODAY  . bacitracin  Topical BID  . escitalopram 10 mg Oral Daily  . feeding supplement (GLUCERNA SHAKE) 237 mL Oral TID BM  . heparin 3,000 Units Intravenous Once  . insulin aspart 0-9 Units Subcutaneous TID AC & HS  . insulin detemir 5 Units Subcutaneous QHS  . levothyroxine 112 mcg Oral QAC breakfast  . mouth rinse 15 mL Mouth Rinse BID  . metoprolol tartrate 12.5 mg Oral BID  . mirtazapine 7.5 mg Oral QHS  . mycophenolate 360 mg Oral BID  . pantoprazole (PROTONIX) IV 40 mg Intravenous Q12H  . predniSONE 10 mg Oral Q breakfast  . sodium chloride flush 3 mL Intravenous Q12H  . tacrolimus 1 mg Oral BID  . torsemide 10 mg Oral Daily  . warfarin 2 mg Oral q1800  . Warfarin - Pharmacist Dosing Inpatient  Does not apply q1800    Assessment: Pharmacy consulted to dose and monitor Heparin therapy in this 76 year old woman with history of atrial fibrillation  Goal of Therapy: Heparin level 0.3-0.7 units/ml Monitor platelets by anticoagulation protocol: Yes  Plan: Will give 3000 units of heparin bolus and will start heparin gtt @ 800 units/hr. Will check Heparin level and CBC @ 0530 on 4/26.   4/26 @ 0530 HL 0.81 supratherapeutic. Will decrease heparin rate to 650 units/hr and recheck HL @ 1330.  4/26 13:58 heparin level therapeutic x 1. Continue current rate. Will recheck in 8 hours.  4/26 @ 2200 HL 0.56 therapeutic. Will check next HL w/ am labs.  Thank you for this  consult.  Tobie Lords, PharmD, BCPS Clinical Pharmacist 12/15/2016   Thank you for this consult.

## 2016-12-15 NOTE — Progress Notes (Signed)
Jonathon Bellows MD [address] [place], [place] [address] Phone: [phone] Fax : [phone]  Elizabeth [name] is being followed for dysphagia  Day 4 of follow up   Subjective: Went into [facility] with [facility]VR last night , able to tolerate PO, no vomiting   Objective: Vital signs in last 24 hours: Vitals:   12/15/16 0445 12/15/16 0448 12/15/16 0754 12/15/16 0954  BP:  (!) 106/54 114/63   Pulse:  (!) 51 (!) 51 65  Resp:  18 16   Temp:  97.5 F (36.4 C) 97.8 F (36.6 C)   TempSrc:  Oral Oral   SpO2:  100% 100%   Weight: 121 lb (54.9 kg)     Height:       Weight change: -12.8 oz (-0.363 kg)  Intake/Output Summary (Last 24 hours) at 12/15/16 1244 Last data filed at 12/15/16 1226  Gross per 24 hour  Intake          1907.74 ml  Output              400 ml  Net          1507.74 ml     Exam: Heart:: S1S2 present, without murmur or extra heart sounds or irregularly irregular heart rate  Lungs: normal, clear to auscultation and clear to auscultation and percussion Abdomen: soft, nontender, normal bowel sounds   Lab Results: @LABTEST2 @ Micro Results: Recent Results (from the past 240 hour(s))  Culture, blood (routine x 2)     Status: None   Collection Time: 12/07/16  6:40 PM  Result Value Ref Range Status   Specimen Description BLOOD LEFT HAND  Final   Special Requests   Final    BOTTLES DRAWN AEROBIC AND ANAEROBIC Blood Culture results may not be optimal due to an excessive volume of blood received in culture bottles   Culture NO GROWTH 5 DAYS  Final   Report Status 12/12/2016 FINAL  Final  Culture, blood (routine x 2)     Status: Abnormal   Collection Time: 12/07/16  6:40 PM  Result Value Ref Range Status   Specimen Description BLOOD  R AC  Final   Special Requests BOTTLES DRAWN AEROBIC AND ANAEROBIC BCAV  Final   Culture  Setup Time   Final    GRAM POSITIVE COCCI AEROBIC BOTTLE ONLY CRITICAL RESULT CALLED TO, READ BACK BY AND VERIFIED WITH: Matt McBane @  [phone] 12/09/16 by [facility]    Culture (A)  Final    STAPHYLOCOCCUS SPECIES (COAGULASE NEGATIVE) THE SIGNIFICANCE OF ISOLATING THIS ORGANISM FROM A SINGLE SET OF BLOOD CULTURES WHEN MULTIPLE SETS ARE DRAWN IS UNCERTAIN. PLEASE NOTIFY THE MICROBIOLOGY DEPARTMENT WITHIN ONE WEEK IF SPECIATION AND SENSITIVITIES ARE REQUIRED. Performed at [facility], [place] [address], [place], [place] [address]    Report Status 12/11/2016 FINAL  Final  Blood Culture ID Panel (Reflexed)     Status: None   Collection Time: 12/07/16  6:40 PM  Result Value Ref Range Status   Enterococcus species NOT DETECTED NOT DETECTED Final   Listeria monocytogenes NOT DETECTED NOT DETECTED Final   Staphylococcus species NOT DETECTED NOT DETECTED Final   Staphylococcus aureus NOT DETECTED NOT DETECTED Final   Streptococcus species NOT DETECTED NOT DETECTED Final   Streptococcus agalactiae NOT DETECTED NOT DETECTED Final   Streptococcus pneumoniae NOT DETECTED NOT DETECTED Final   Streptococcus pyogenes NOT DETECTED NOT DETECTED Final   Acinetobacter baumannii NOT DETECTED NOT DETECTED Final   Enterobacteriaceae species  NOT DETECTED NOT DETECTED Final   Enterobacter cloacae complex NOT DETECTED NOT DETECTED Final   Escherichia coli NOT DETECTED NOT DETECTED Final   Klebsiella oxytoca NOT DETECTED NOT DETECTED Final   Klebsiella pneumoniae NOT DETECTED NOT DETECTED Final   Proteus species NOT DETECTED NOT DETECTED Final   Serratia marcescens NOT DETECTED NOT DETECTED Final   Haemophilus influenzae NOT DETECTED NOT DETECTED Final   Neisseria meningitidis NOT DETECTED NOT DETECTED Final   Pseudomonas aeruginosa NOT DETECTED NOT DETECTED Final   Candida albicans NOT DETECTED NOT DETECTED Final   Candida glabrata NOT DETECTED NOT DETECTED Final   Candida krusei NOT DETECTED NOT DETECTED Final   Candida parapsilosis NOT DETECTED NOT DETECTED Final   Candida tropicalis NOT DETECTED NOT DETECTED Final  Culture, blood (Routine X 2)  w Reflex to ID Panel     Status: None   Collection Time: 12/09/16  9:29 AM  Result Value Ref Range Status   Specimen Description BLOOD RIGHT HAND  Final   Special Requests   Final    BOTTLES DRAWN AEROBIC AND ANAEROBIC Blood Culture adequate volume   Culture NO GROWTH 5 DAYS  Final   Report Status 12/14/2016 FINAL  Final  Culture, blood (Routine X 2) w Reflex to ID Panel     Status: None   Collection Time: 12/09/16 11:51 AM  Result Value Ref Range Status   Specimen Description BLOOD RIGHT HAND  Final   Special Requests   Final    BOTTLES DRAWN AEROBIC AND ANAEROBIC Blood Culture adequate volume   Culture NO GROWTH 5 DAYS  Final   Report Status 12/14/2016 FINAL  Final   Studies/Results: Dg Chest 1 View  Result Date: 12/13/2016 CLINICAL DATA:  Right thoracentesis. EXAM: CHEST 1 VIEW COMPARISON:  12/13/2016 FINDINGS: Small bilateral pleural effusions. Mild right basilar atelectasis. No pneumothorax. Stable cardiomegaly. Prior CABG. No acute osseous abnormality. IMPRESSION: Small bilateral pleural effusions. No right pneumothorax status post thoracentesis. Electronically Signed   By: Kathreen Devoid   On: 12/13/2016 15:54   Dg Chest Port 1 View  Result Date: 12/14/2016 CLINICAL DATA:  76 year old female with PICC line placement. EXAM: PORTABLE CHEST 1 VIEW COMPARISON:  Chest radiograph dated 12/13/2016 FINDINGS: There has been interval placement of a right-sided PICC with tip over the right atrium close to the cavoatrial junction. Small bilateral pleural effusions, right greater left with associated compressive atelectasis of the lung bases. Pneumonia is not excluded. There is no pneumothorax. Stable cardiac silhouette. Postsurgical changes of CABG. There is atherosclerotic calcification of the aorta. No acute osseous pathology. IMPRESSION: 1. Interval placement of a right-sided PICC with tip over the right atrium close to the cavoatrial junction. No pneumothorax. 2. Cardiomegaly with small  bilateral pleural effusions and associated partial compressive atelectasis versus infiltrate of the lung bases. 3. Atherosclerotic calcification of the aorta. Electronically Signed   By: Anner Crete M.D.   On: 12/14/2016 22:51   US Thoracentesis Asp Pleural Space W/img Guide  Result Date: 12/13/2016 INDICATION: Shortness of breath EXAM: ULTRASOUND GUIDED RIGHT THORACENTESIS MEDICATIONS: None. COMPLICATIONS: None immediate. PROCEDURE: An ultrasound guided thoracentesis was thoroughly discussed with the patient and questions answered. The benefits, risks, alternatives and complications were also discussed. The patient understands and wishes to proceed with the procedure. Written consent was obtained. Ultrasound was performed to localize and mark an adequate pocket of fluid in the right chest. The area was then prepped and draped in the normal sterile fashion. 1% Lidocaine was used for  local anesthesia. Under ultrasound guidance a 6 Fr Safe-T-Centesis catheter was introduced. Thoracentesis was performed. The catheter was removed and a dressing applied. FINDINGS: A total of approximately 1000 mL of straw-colored fluid was removed. Samples were sent to the laboratory as requested by the clinical team. IMPRESSION: Successful ultrasound guided right thoracentesis yielding 1000 mL of pleural fluid. Electronically Signed   By: Kathreen Devoid   On: 12/13/2016 15:53   Medications: I have reviewed the patient's current medications. Scheduled Meds: . atorvastatin  10 mg Oral QODAY  . bacitracin   Topical BID  . escitalopram  10 mg Oral Daily  . feeding supplement (GLUCERNA SHAKE)  237 mL Oral TID BM  . insulin aspart  0-9 Units Subcutaneous TID AC & HS  . insulin detemir  5 Units Subcutaneous QHS  . levothyroxine  112 mcg Oral QAC breakfast  . mouth rinse  15 mL Mouth Rinse BID  . metoprolol tartrate  12.5 mg Oral BID  . mirtazapine  7.5 mg Oral QHS  . mycophenolate  360 mg Oral BID  . pantoprazole  (PROTONIX) IV  40 mg Intravenous Q12H  . predniSONE  10 mg Oral Q breakfast  . sodium chloride flush  10-40 mL Intracatheter Q12H  . sodium chloride flush  3 mL Intravenous Q12H  . tacrolimus  1 mg Oral BID  . warfarin  2.5 mg Oral q1800  . Warfarin - Pharmacist Dosing Inpatient   Does not apply q1800   Continuous Infusions: . sodium chloride 50 mL/hr at 12/15/16 1111  . fluconazole (DIFLUCAN) IV Stopped (12/13/16 1828)  . heparin 650 Units/hr (12/15/16 0824)   PRN Meds:.acetaminophen **OR** acetaminophen, albuterol, HYDROcodone-acetaminophen, hyoscyamine, ondansetron **OR** ondansetron (ZOFRAN) IV, polyethylene glycol, sodium chloride flush   Assessment: Active Problems:   AKI (acute kidney injury) ([place])  Atlas Kuc Dafoe 76 y.o. female being followed for dysphagia likely secondary to achalasia or hypertensive LES   Plan: 1. Advance diet to soft diet, advised to avoid steak or pork chops at home. She likely has achalasia and will evaluate her further in the out patient   I will sign off.  Please call me if any further GI concerns or questions.  We would like to thank you for the opportunity to participate in the care of Elizabeth [name].    LOS: 8 days   Jonathon Bellows 12/15/2016, 12:44 PM

## 2016-12-15 NOTE — Progress Notes (Signed)
Physical Therapy Treatment Patient Details Name: Elizabeth [name] MRN: [id number] DOB: 03/23/1941 Today's Date: 12/15/2016    History of Present Illness Pt is a 76 y/o F who presents with weakness, vomiting, and diarrhea.  Pt admitted with acute kidney injury on chronic kidney disease stage III, on immunosuppresant medications.  Pt was recently admitted to [facility] in [place] for similar complaints along with influenza B and shortness of breath. Pt is on oxygen at home since this previous admission.  Chest x ray revealed R sided moderate pleural effusion and small L pleural effusion.  Pt's PMH includes bradycardia, L4 compression fx, CVA, ankle fx, wrist fx, neuropathy, osteoporosis, PVD, TBI, L BKA, renal transplant.    PT Comments    Treatment initially attempted this morning, but while beginning session, nursing student in to tend to pt. Spoke with spouse during this time regarding his concerns for home versus placement in skilled nursing facility. Discussion/explanation regarding therapy's recommendation and listened to feedback regarding pt/spouse and family needs. (similar discussion with daughter during afternoon session). After nursing finished and initiating session again, pt notes she will need changed/cleaned. Treatment postponed. This afternoon pt lethargic, but agreeable to PT. Pt participates in supine exercises with assist as needed. Requires Mod A to edge of bed and return to supine. Experiences dizziness in under 1 minute that does not resolve requiring pt to return to supine. Pt does not wish to re attempt. Encouraged placing bed in more upright position throughtout the day to increased tolerance. Stand tolerance not appropriate at this time. Spouse previously told therapist this morn that pt needs to be able to stand for pivot transfer and for some daily care for 2-3 minutes if he is assisting her alone. Pt currently not close to baseline and recommendation remain  skilled nursing facility; family would prefer to take her home, but good awareness of safety issue without additional help in place. Continue progress of strength, endurance and upright tolerance to improve functional mobility towards baseline.    Follow Up Recommendations  SNF     Equipment Recommendations  None recommended by PT    Recommendations for Other Services       Precautions / Restrictions Precautions Precautions: Fall Restrictions Weight Bearing Restrictions: Yes RLE Weight Bearing: Non weight bearing LLE Weight Bearing: Non weight bearing    Mobility  Bed Mobility Overal bed mobility: Needs Assistance Bed Mobility: Supine to Sit;Sit to Supine     Supine to sit: Mod assist;HOB elevated Sit to supine: Mod assist;HOB elevated   General bed mobility comments: Assist for trunk and difficulty scooting to edge of bed requiring Min A. Maintains sit for under a minute with increased dizziness and need to return to supine.   Transfers                 General transfer comment: Unable to attempt stand at this time due to weakness, dizziness and lethargy  Ambulation/Gait                 Stairs            Wheelchair Mobility    Modified Rankin (Stroke Patients Only)       Balance Overall balance assessment: Needs assistance Sitting-balance support: Bilateral upper extremity supported Sitting balance-Leahy Scale: Fair (fair -) Sitting balance - Comments: uanble to maintain sit long < 1 min  Cognition Arousal/Alertness: Lethargic Behavior During Therapy: WFL for tasks assessed/performed Overall Cognitive Status: Within Functional Limits for tasks assessed                                        Exercises General Exercises - Lower Extremity Ankle Circles/Pumps: Right;AROM;20 reps;Supine Quad Sets: Strengthening;Both;10 reps;Supine Gluteal Sets: Strengthening;Both;10  reps;Supine Short Arc Quad: AROM;Both;10 reps;Supine (2 sets) Heel Slides: AROM;Both;10 reps;Supine;Other (comment) (Additional set of 5 each) Hip ABduction/ADduction: AAROM;Right;20 reps;Supine;Other (comment) (10 reps x 2 active on L) Straight Leg Raises: Strengthening;Both;10 reps;Supine    General Comments        Pertinent Vitals/Pain Pain Assessment: No/denies pain    Home Living                      Prior Function            PT Goals (current goals can now be found in the care plan section) Progress towards PT goals: Progressing toward goals (slowly)    Frequency    Min 2X/week      PT Plan Current plan remains appropriate    Co-evaluation             End of Session Equipment Utilized During Treatment: Oxygen Activity Tolerance: Patient limited by fatigue;Other (comment) (dizziness) Patient left: in bed;with call bell/phone within reach;with bed alarm set;with family/visitor present   PT Visit Diagnosis: Muscle weakness (generalized) (M62.81);Unsteadiness on feet (R26.81)     Time: 1415-1450 (also spoke with spouse in a.m during treatment attempt x 15 ) PT Time Calculation (min) (ACUTE ONLY): 35 min  Charges:  $Therapeutic Exercise: 8-22 mins $Therapeutic Activity: 8-22 mins                    G CodesLarae Grooms, PTA 12/15/2016, 4:54 PM

## 2016-12-15 NOTE — Progress Notes (Signed)
MEDICATION RELATED CONSULT NOTE - INITIAL   Pharmacy Consult for drug interaction identification Indication: new amiodarone therapy  Allergies  Allergen Reactions  . Azithromycin Other (See Comments)    Upset stomach  . Erythromycin Nausea And Vomiting  . Fentanyl Nausea And Vomiting  . Percocet [Oxycodone-Acetaminophen] Nausea And Vomiting    Patient Measurements: Height: 5\' 3"  (160 cm) Weight: 121 lb (54.9 kg) IBW/kg (Calculated) : 52.4 Adjusted Body Weight:   Vital Signs: Temp: 97.8 F (36.6 C) (04/26 0754) Temp Source: Oral (04/26 0754) BP: 114/63 (04/26 0754) Pulse Rate: 51 (04/26 0754) Intake/Output from previous day: 04/25 0701 - 04/26 0700 In: 1147.7 [P.O.:600; I.V.:547.7] Out: 200 [Urine:200] Intake/Output from this shift: No intake/output data recorded.  Labs:  Recent Labs  12/14/16 1835 12/14/16 2002 12/15/16 0528  WBC  --  4.0 4.0  HGB  --  9.4* 9.4*  HCT  --  27.6* 28.2*  PLT  --  148* 134*  APTT  --  30  --   CREATININE 1.51*  --  1.68*  MG 1.4*  --   --   PHOS  --   --  3.0  ALBUMIN  --   --  2.4*   Estimated Creatinine Clearance: 23.2 mL/min (A) (by C-G formula based on SCr of 1.68 mg/dL (H)).   Microbiology: Recent Results (from the past 720 hour(s))  Susceptibility, Aer + Anaerob     Status: None   Collection Time: 11/22/16 12:00 AM  Result Value Ref Range Status   Suscept, Aer + Anaerob Final report  Corrected    Comment: (NOTE) Performed At: [facility] [address] [place], [place] [id number] Lindon Romp MD OE:[id number] CORRECTED ON 04/13 AT 8299: PREVIOUSLY REPORTED AS Preliminary report    Source of Sample URINE/ ENTEROCOCCUS FAECIUM SUSCEPTIBILITY  Final  Susceptibility Result     Status: None   Collection Time: 11/22/16 12:00 AM  Result Value Ref Range Status   Suscept Result 1 Enterococcus faecium  Final    Comment: (NOTE) Identification performed by account, not confirmed by  this laboratory. Ciprofloxacin     >2      Resistant Nitrofurantoin    64      Intermediate Levofloxacin      >4      Resistant Tetracycline      >8      Resistant Rifampin         <=1      Susceptible    Antimicrobial Suscept Comment  Final    Comment: (NOTE)      ** S = Susceptible; I = Intermediate; R = Resistant **                   P = Positive; N = Negative            MICS are expressed in micrograms per mL   Antibiotic                 RSLT#1    RSLT#2    RSLT#3    RSLT#4 Penicillin                     R Vancomycin                     R Performed At: [facility] [place], [place] [id number] Lindon Romp MD FY:[id number]   Blood culture (routine x 2)     Status: None  Collection Time: 11/22/16 12:52 PM  Result Value Ref Range Status   Specimen Description BLOOD RIGHT ANTECUBITAL  Final   Special Requests   Final    BOTTLES DRAWN AEROBIC AND ANAEROBIC Blood Culture adequate volume   Culture NO GROWTH 5 DAYS  Final   Report Status 11/27/2016 FINAL  Final  Blood culture (routine x 2)     Status: None   Collection Time: 11/22/16 12:54 PM  Result Value Ref Range Status   Specimen Description BLOOD RIGHT FOREARM  Final   Special Requests   Final    BOTTLES DRAWN AEROBIC ONLY Blood Culture adequate volume   Culture NO GROWTH 5 DAYS  Final   Report Status 11/27/2016 FINAL  Final  Urine culture     Status: Abnormal   Collection Time: 11/22/16  3:44 PM  Result Value Ref Range Status   Specimen Description URINE, CATHETERIZED  Final   Special Requests NONE  Final   Culture (A)  Final    80,000 COLONIES/mL ENTEROCOCCUS FAECIUM SEE SEPARATE REPORT FOR SUSCEPTIBILITY RESULTS Performed at [place], READ BACK BY AND VERIFIED WITH: R SAWYER,RN AT 1221 11/26/16 BY L BENFIELD CONCERNING DELAY IN RESULTS    Report Status 12/05/2016 FINAL  Final  Gram stain     Status: None   Collection Time: 11/24/16  1:06 PM  Result Value Ref  Range Status   Specimen Description Pleural R  Final   Special Requests NONE  Final   Gram Stain   Final    CYTOSPIN SMEAR WBC PRESENT,BOTH PMN AND MONONUCLEAR NO ORGANISMS SEEN    Report Status 11/24/2016 FINAL  Final  Culture, body fluid-bottle     Status: None   Collection Time: 11/24/16  1:06 PM  Result Value Ref Range Status   Specimen Description PLEURAL  Final   Special Requests NONE  Final   Culture NO GROWTH 5 DAYS  Final   Report Status 11/29/2016 FINAL  Final  MRSA PCR Screening     Status: None   Collection Time: 11/24/16  6:04 PM  Result Value Ref Range Status   MRSA by PCR NEGATIVE NEGATIVE Final    Comment:        The GeneXpert MRSA Assay (FDA approved for NASAL specimens only), is one component of a comprehensive MRSA colonization surveillance program. It is not intended to diagnose MRSA infection nor to guide or monitor treatment for MRSA infections.   Culture, blood (routine x 2)     Status: None   Collection Time: 12/07/16  6:40 PM  Result Value Ref Range Status   Specimen Description BLOOD LEFT HAND  Final   Special Requests   Final    BOTTLES DRAWN AEROBIC AND ANAEROBIC Blood Culture results may not be optimal due to an excessive volume of blood received in culture bottles   Culture NO GROWTH 5 DAYS  Final   Report Status 12/12/2016 FINAL  Final  Culture, blood (routine x 2)     Status: Abnormal   Collection Time: 12/07/16  6:40 PM  Result Value Ref Range Status   Specimen Description BLOOD  R AC  Final   Special Requests BOTTLES DRAWN AEROBIC AND ANAEROBIC BCAV  Final   Culture  Setup Time   Final    GRAM POSITIVE COCCI AEROBIC BOTTLE ONLY CRITICAL RESULT CALLED TO, READ BACK BY AND VERIFIED WITH: Matt McBane @ 1027 12/09/16 by [facility]    Culture (A)  Final    STAPHYLOCOCCUS SPECIES (  COAGULASE NEGATIVE) THE SIGNIFICANCE OF ISOLATING THIS ORGANISM FROM A SINGLE SET OF BLOOD CULTURES WHEN MULTIPLE SETS ARE DRAWN IS UNCERTAIN. PLEASE NOTIFY THE  MICROBIOLOGY DEPARTMENT WITHIN ONE WEEK IF SPECIATION AND SENSITIVITIES ARE REQUIRED. Performed at [facility], [place] [address], [place], [place] [address]    Report Status 12/11/2016 FINAL  Final  Blood Culture ID Panel (Reflexed)     Status: None   Collection Time: 12/07/16  6:40 PM  Result Value Ref Range Status   Enterococcus species NOT DETECTED NOT DETECTED Final   Listeria monocytogenes NOT DETECTED NOT DETECTED Final   Staphylococcus species NOT DETECTED NOT DETECTED Final   Staphylococcus aureus NOT DETECTED NOT DETECTED Final   Streptococcus species NOT DETECTED NOT DETECTED Final   Streptococcus agalactiae NOT DETECTED NOT DETECTED Final   Streptococcus pneumoniae NOT DETECTED NOT DETECTED Final   Streptococcus pyogenes NOT DETECTED NOT DETECTED Final   Acinetobacter baumannii NOT DETECTED NOT DETECTED Final   Enterobacteriaceae species NOT DETECTED NOT DETECTED Final   Enterobacter cloacae complex NOT DETECTED NOT DETECTED Final   Escherichia coli NOT DETECTED NOT DETECTED Final   Klebsiella oxytoca NOT DETECTED NOT DETECTED Final   Klebsiella pneumoniae NOT DETECTED NOT DETECTED Final   Proteus species NOT DETECTED NOT DETECTED Final   Serratia marcescens NOT DETECTED NOT DETECTED Final   Haemophilus influenzae NOT DETECTED NOT DETECTED Final   Neisseria meningitidis NOT DETECTED NOT DETECTED Final   Pseudomonas aeruginosa NOT DETECTED NOT DETECTED Final   Candida albicans NOT DETECTED NOT DETECTED Final   Candida glabrata NOT DETECTED NOT DETECTED Final   Candida krusei NOT DETECTED NOT DETECTED Final   Candida parapsilosis NOT DETECTED NOT DETECTED Final   Candida tropicalis NOT DETECTED NOT DETECTED Final  Culture, blood (Routine X 2) w Reflex to ID Panel     Status: None   Collection Time: 12/09/16  9:29 AM  Result Value Ref Range Status   Specimen Description BLOOD RIGHT HAND  Final   Special Requests   Final    BOTTLES DRAWN AEROBIC AND ANAEROBIC Blood  Culture adequate volume   Culture NO GROWTH 5 DAYS  Final   Report Status 12/14/2016 FINAL  Final  Culture, blood (Routine X 2) w Reflex to ID Panel     Status: None   Collection Time: 12/09/16 11:51 AM  Result Value Ref Range Status   Specimen Description BLOOD RIGHT HAND  Final   Special Requests   Final    BOTTLES DRAWN AEROBIC AND ANAEROBIC Blood Culture adequate volume   Culture NO GROWTH 5 DAYS  Final   Report Status 12/14/2016 FINAL  Final    Medical History: Past Medical History:  Diagnosis Date  . Adenomatous polyp   . Anemia   . 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 Atrial fibrillation ([place])    a. on Coumadin. No rate-controlling medications with baseline bradycardia.   . Bradycardia   . CAD (coronary artery disease)    a. s/p 1-vessel CABG to LCx in 2001  . Carotid artery stenosis    mild  . Carotid bruit    left  . Chronic kidney disease    s/p cadaveric renal transplant in 2001  . Colitis   . Compression fracture of L4 lumbar vertebra ([facility])   . Coronary artery disease   . CVA (cerebral infarction)   . Diabetes mellitus   .  Diabetic retinopathy   . Esophageal candidiasis ([place])   . Flu 11/22/2016  . Fracture of right maxilla 09/21/2012  . Fx ankle   . Fx wrist   . Gastritis   . GERD (gastroesophageal reflux disease)   . H/O immunosuppressive therapy   . History of orthostatic hypotension   . Hyperlipidemia   . Hyperparathyroidism   . Hypothyroidism   . IBS (irritable bowel syndrome)   . Ischemic colitis ([place])   . Lactose intolerance   . Leg ulcer ([place])    from poor fitting prosthetic  . Melanoma ([place])   . Nephropathy   . 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 Neuropathy   . Osteoporosis    recurrent fractures  . Pancreatic cyst   . Pneumonia 11/2016  . Pulmonary embolism ([place])   . PVD (peripheral vascular disease) ([place])   . Renal artery stenosis ([place])    . 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 Renal osteodystrophy   . Right orbit fracture ([place]) 09/21/2012  . Stroke ([place])   . TBI (traumatic brain injury) ([place]) 09/30/2012  . Tubular adenoma   . Zygoma fracture ([place]) 09/21/2012    Medications:  Prescriptions Prior to Admission  Medication Sig Dispense Refill Last Dose  . acetaminophen (TYLENOL) 500 MG tablet Take 1,000 mg by mouth every 6 (six) hours as needed for moderate pain or headache.    prn at prn  . atorvastatin (LIPITOR) 10 MG tablet Take 1 tablet (10 mg total) by mouth every other day. 30 tablet 1 12/06/2016 at pm  . cholecalciferol (VITAMIN D) 1000 UNITS tablet Take 2,000 Units by mouth daily.    12/07/2016 at 1200  . docusate sodium (COLACE) 100 MG capsule Take 100 mg by mouth daily as needed for moderate constipation.    prn at prn  . escitalopram (LEXAPRO) 10 MG tablet Take 10 mg by mouth daily.   12/07/2016 at am  . ferrous sulfate (SM IRON) 325 (65 FE) MG tablet Take 325 mg by mouth daily.    12/06/2016 at late afternoon  . HYDROcodone-acetaminophen (NORCO/VICODIN) 5-325 MG per tablet Take 2 tablets by mouth 2 (two) times daily as needed for moderate pain.    prn at prn  . hyoscyamine (ANASPAZ) 0.125 MG TBDP disintergrating tablet Place 0.125 mg under the tongue as needed for cramping.    prn at prn  . insulin glargine (LANTUS) 100 UNIT/ML injection Inject 7 Units into the skin every morning.    12/07/2016 at am  . insulin lispro (HUMALOG) 100 UNIT/ML injection Inject 2-9 Units into the skin 4 (four) times daily -  before meals and at bedtime. Per sliding scale.   prn at prn  . levothyroxine (SYNTHROID, LEVOTHROID) 112 MCG tablet Take 112 mcg by mouth daily before breakfast.   12/07/2016 at am  . magnesium gluconate (MAGONATE) 500 MG tablet Take 1,000 mg by mouth daily.    12/06/2016 at late afternoon  . mirtazapine (REMERON) 7.5 MG tablet Take 7.5 mg by mouth at bedtime.    12/06/2016 at qhs   . Multiple Vitamin  (MULTIVITAMIN WITH MINERALS) TABS Take 1 tablet by mouth daily.   12/07/2016 at 1200  . mycophenolate (MYFORTIC) 360 MG TBEC Take 360 mg by mouth 2 (two) times daily.    12/07/2016 at am  . nitroGLYCERIN (NITROSTAT) 0.4 MG SL tablet Place 1 tablet (0.4 mg total) under the tongue every 5 (five) minutes as needed for chest pain. 25 tablet 3 prn at  prn  . omeprazole (PRILOSEC) 20 MG capsule Take 20 mg by mouth at bedtime.   12/06/2016 at qhs   . ondansetron (ZOFRAN) 4 MG tablet Take 4 mg by mouth every 8 (eight) hours as needed for nausea or vomiting.    12/07/2016 at 1000  . potassium chloride (K-DUR) 10 MEQ tablet Take 10 mEq by mouth daily at 12 noon.    12/07/2016 at 1200  . predniSONE (DELTASONE) 5 MG tablet Take 5 mg by mouth every morning.    12/07/2016 at am  . promethazine (PHENERGAN) 25 MG tablet Take 1 tablet (25 mg total) by mouth every 6 (six) hours as needed for nausea or vomiting. 30 tablet 1 prn at prn  . tacrolimus (PROGRAF) 0.5 MG capsule Take 1-2 mg by mouth See admin instructions. Take 2mg  in the am & 1mg  at night    12/07/2016 at am  . torsemide (DEMADEX) 20 MG tablet TAKE ONE TABLET BY MOUTH TWICE DAILY AS NEEDED (Patient taking differently: Take 10 mg by mouth at bedtime alternating with 20 mg by mouth at bedtime.) 180 tablet 2 Past Week at Unknown time  . warfarin (COUMADIN) 2.5 MG tablet TAKE AS DIRECTED BY COUMADIN CLINIC (Patient taking differently: Take 5 mg by mouth daily on Sunday, Tuesday and Thursday. Take 2.5 mg by mouth daily on all other days) 150 tablet 1 12/06/2016 at pm  . midodrine (PROAMATINE) 10 MG tablet Take 1 tablet (10 mg total) by mouth 3 (three) times daily as needed. (Patient not taking: Reported on 12/07/2016) 90 tablet 6 Not Taking at prn    Assessment: Ms. Hunsberger is a 76 year old female with PMH including renal transplant on immunosuppresive therapies, atrial fibrillation on VKA with a recent history of supratherapeutic INR, type 2 diabetes with complications,  HLD, HTN, chronic diastolic heart failure, history of PE, hypothyroidism, and stroke. She presents with emesis, weakness, vomiting, diarrhea after being treated for influenza. Her admission has included a dose adjustment to tacrolimus for supratherapeutic levels, an episode of hypothermia/hypoglycemia with IV antibiotic administration, vitamin K administration for supratherapeutic INR and EGD for dysphagia work up. Nephrology Gastroenterology are following. Last night patient had elevated heart rates sustained in 160s and felt lousy. She was started on metoprolol. She continue to have runs of VT with HR 150 to 170. Cardiology was consulted, and started amiodarone and heparin drip. A PICC line was placed. EKG revealed AF. Patient converted back and amiodarone drip was discontinued.   Pharmacy is consulted to identify drug-drug interactions with amiodarone; cardiology plans to continue amiodarone at discharge.  Patient has prolonged QTc 527 ms on last EKG although interpretation is best left to cardiologist due to atrial fibrillation.   Identified Drug-Drug interactions  Drug 1  Drug 2  Reaction  Severity  1.  Amiodarone Escitalopram Prolonged QTc X - Avoid combination 2.  Amiodarone Fluconazole Prolonged QTc D - Consider Modification 3.  Amiodarone Mirtazepine Prolonged QTc D - Consider Modification 4.  Amiodarone Tacrolimus Prolonged QTc D - Consider Modification 5.  Escitalopram Fluconazole Increased Escitalopram exposure D - Consider Modification 6.  Tacrolimus Fluconazole Increased tacrolimus exposure D - Consider Modification 7.  Amiodarone Warfarin Increased INR  D - Consider Modification 8.  Warfarin Fluconazole Increased INR  D - Consider Modification  9 Amiodarone Atorvastatin Increased atorvastatin exposure C - Monitor Therapy 10.  Fluconazole Atorvastatin Increased atorvastatin exposure C - Monitor Therapy 11.  Escitalopram Mirtazepine Prolonged QTc C - Monitor Therapy 12.   Escitalopram Tacrolimus  Prolonged QTc C - Monitor Therapy  Plan:  1. This patient has several agents that may contribute to prolonged QT interval and has had prolonged QT on a recent EKG. If amiodarone needs to be continued the escitalopram and mirtazepine may need to be discontinued. Escitalopram concentrations may be increased by fluconazole, and adds to the QT prolonging risk of amiodarone, fluconazole, mirtazepine, and tacrolimus. If additional therapy is warranted, duloxetine carries a lower risk of QT prolongation and may be an appropriate substitute.  2. It is unclear if EGD showed candida and if fluconazole needs to be continued. Considering the risk of drug-drug interactions between fluconazole and (amiodarone, escitalopram, tacrolimus, atorvastatin, and warfarin) fluconazole may need to be discontinued.   3. Rechecking tacrolimus level is recommended - dose was reduced from 2 mg po AM + 1 mg po AM starting 12/10/16 due to supratherapeutic levels and there are several newly added drugs that may affect the tacrolimus concentration. Amiodarone and tacrolimus may interact to increase QT interval, however no mitigation strategy is identified.  4. INR will be monitored daily while in patient and should be checked frequently until stable when discharged.  5. Repeat EKGs may be indicated due to prolonged QT and proposed changes to medication profile.  6. Potassium should be maintained > 4, magnesium should be maintained > 2.   7. Atorvastatin is prescribed as 10 mg po every other day. Monitoring for signs and symptoms   Laural Benes, Pharm.D., BCPS Clinical Pharmacist 12/15/2016,8:24 AM

## 2016-12-15 NOTE — Plan of Care (Signed)
Problem: Pain Managment: Goal: General experience of comfort will improve Outcome: Progressing Pt with no complaints of pain this shift. Will continue to monitor.  Problem: Tissue Perfusion: Goal: Risk factors for ineffective tissue perfusion will decrease Outcome: Progressing Pt now on heparin gtt @ 800 units/hr.

## 2016-12-15 NOTE — Progress Notes (Signed)
Pt complains of nausea and vomiting. PRN zofran given and is effective. MD notified that patient did not tolerate lunch. MD changed diet order. I will continue to assess.

## 2016-12-15 NOTE — Progress Notes (Signed)
Received a page last night regarding patient being in "SVT" with heart rates in the 150s.  No EKG had been recorded.  Heart rate remained elevated and BP was 47V systolic.  EKG revealed atrial fibrillation.  Patient was started on amiodarone bolus and infusion and heparin drip.  Converted back to sinus rhythm overnight.   Jonmarc Bodkin C. Oval Linsey, MD, [facility]  12/15/2016 7:09 AM

## 2016-12-15 NOTE — Progress Notes (Signed)
ANTICOAGULATION CONSULT NOTE - Initial Consult  Pharmacy Consult for Heparin  Indication: rapid heart beat        Allergies  Allergen Reactions  . Azithromycin Other (See Comments)    Upset stomach  . Erythromycin Nausea And Vomiting  . Fentanyl Nausea And Vomiting  . Percocet [Oxycodone-Acetaminophen] Nausea And Vomiting    Patient Measurements: Height: 5\' 3"  (160 cm) Weight: 121 lb (54.9 kg) IBW/kg (Calculated) : 52.4 Heparin Dosing Weight:   Vital Signs: Temp: 98.2 F (36.8 C) (04/25 1923) Temp Source: Oral (04/25 1923) BP: 79/34 (04/25 1948) Pulse Rate: 87 (04/25 1948)  Labs:  Recent Labs (last 2 labs)    Recent Labs  12/12/16 0527 12/13/16 0540 12/14/16 1835  HGB 9.9*  --   --   HCT 30.3*  --   --   PLT 181  --   --   LABPROT 30.3* 19.1*  --   INR 2.83 1.59  --   CREATININE 1.28*  --  1.51*      Estimated Creatinine Clearance: 25.8 mL/min (A) (by C-G formula based on SCr of 1.51 mg/dL (H)).   Medical History:     Past Medical History:  Diagnosis Date  . Adenomatous polyp   . Anemia   . 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 Atrial fibrillation ([place])    a. on Coumadin. No rate-controlling medications with baseline bradycardia.   . Bradycardia   . CAD (coronary artery disease)    a. s/p 1-vessel CABG to LCx in 2001  . Carotid artery stenosis    mild  . Carotid bruit    left  . Chronic kidney disease    s/p cadaveric renal transplant in 2001  . Colitis   . Compression fracture of L4 lumbar vertebra (HCC)   . Coronary artery disease   . CVA (cerebral infarction)   . Diabetes mellitus   . Diabetic retinopathy   . Esophageal candidiasis ([place])   . Flu 11/22/2016  . Fracture of right maxilla 09/21/2012  . Fx ankle   . Fx wrist   . Gastritis   . GERD (gastroesophageal reflux disease)   . H/O immunosuppressive  therapy   . History of orthostatic hypotension   . Hyperlipidemia   . Hyperparathyroidism   . Hypothyroidism   . IBS (irritable bowel syndrome)   . Ischemic colitis ([place])   . Lactose intolerance   . Leg ulcer ([place])    from poor fitting prosthetic  . Melanoma ([place])   . Nephropathy   . 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 Neuropathy   . Osteoporosis    recurrent fractures  . Pancreatic cyst   . Pneumonia 11/2016  . Pulmonary embolism ([place])   . PVD (peripheral vascular disease) ([place])   . Renal artery stenosis ([place])   . 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 Renal osteodystrophy   . Right orbit fracture ([place]) 09/21/2012  . Stroke ([place])   . TBI (traumatic brain injury) ([place]) 09/30/2012  . Tubular adenoma   . Zygoma fracture ([place]) 09/21/2012    Medications:         Prescriptions Prior to Admission  Medication Sig Dispense Refill Last Dose  . acetaminophen (TYLENOL) 500 MG tablet Take 1,000 mg by mouth every 6 (six) hours as needed for moderate pain or headache.    prn at prn  . atorvastatin (LIPITOR) 10  MG tablet Take 1 tablet (10 mg total) by mouth every other day. 30 tablet 1 12/06/2016 at pm  . cholecalciferol (VITAMIN D) 1000 UNITS tablet Take 2,000 Units by mouth daily.    12/07/2016 at 1200  . docusate sodium (COLACE) 100 MG capsule Take 100 mg by mouth daily as needed for moderate constipation.    prn at prn  . escitalopram (LEXAPRO) 10 MG tablet Take 10 mg by mouth daily.   12/07/2016 at am  . ferrous sulfate (SM IRON) 325 (65 FE) MG tablet Take 325 mg by mouth daily.    12/06/2016 at late afternoon  . HYDROcodone-acetaminophen (NORCO/VICODIN) 5-325 MG per tablet Take 2 tablets by mouth 2 (two) times daily as needed for moderate pain.    prn at prn  . hyoscyamine (ANASPAZ) 0.125 MG TBDP disintergrating tablet Place 0.125 mg under the tongue as needed for cramping.    prn at prn  . insulin glargine (LANTUS) 100 UNIT/ML  injection Inject 7 Units into the skin every morning.    12/07/2016 at am  . insulin lispro (HUMALOG) 100 UNIT/ML injection Inject 2-9 Units into the skin 4 (four) times daily -  before meals and at bedtime. Per sliding scale.   prn at prn  . levothyroxine (SYNTHROID, LEVOTHROID) 112 MCG tablet Take 112 mcg by mouth daily before breakfast.   12/07/2016 at am  . magnesium gluconate (MAGONATE) 500 MG tablet Take 1,000 mg by mouth daily.    12/06/2016 at late afternoon  . mirtazapine (REMERON) 7.5 MG tablet Take 7.5 mg by mouth at bedtime.    12/06/2016 at qhs   . Multiple Vitamin (MULTIVITAMIN WITH MINERALS) TABS Take 1 tablet by mouth daily.   12/07/2016 at 1200  . mycophenolate (MYFORTIC) 360 MG TBEC Take 360 mg by mouth 2 (two) times daily.    12/07/2016 at am  . nitroGLYCERIN (NITROSTAT) 0.4 MG SL tablet Place 1 tablet (0.4 mg total) under the tongue every 5 (five) minutes as needed for chest pain. 25 tablet 3 prn at prn  . omeprazole (PRILOSEC) 20 MG capsule Take 20 mg by mouth at bedtime.   12/06/2016 at qhs   . ondansetron (ZOFRAN) 4 MG tablet Take 4 mg by mouth every 8 (eight) hours as needed for nausea or vomiting.    12/07/2016 at 1000  . potassium chloride (K-DUR) 10 MEQ tablet Take 10 mEq by mouth daily at 12 noon.    12/07/2016 at 1200  . predniSONE (DELTASONE) 5 MG tablet Take 5 mg by mouth every morning.    12/07/2016 at am  . promethazine (PHENERGAN) 25 MG tablet Take 1 tablet (25 mg total) by mouth every 6 (six) hours as needed for nausea or vomiting. 30 tablet 1 prn at prn  . tacrolimus (PROGRAF) 0.5 MG capsule Take 1-2 mg by mouth See admin instructions. Take 2mg  in the am & 1mg  at night    12/07/2016 at am  . torsemide (DEMADEX) 20 MG tablet TAKE ONE TABLET BY MOUTH TWICE DAILY AS NEEDED (Patient taking differently: Take 10 mg by mouth at bedtime alternating with 20 mg by mouth at bedtime.) 180 tablet 2 Past Week at Unknown time  . warfarin (COUMADIN) 2.5 MG tablet TAKE  AS DIRECTED BY COUMADIN CLINIC (Patient taking differently: Take 5 mg by mouth daily on Sunday, Tuesday and Thursday. Take 2.5 mg by mouth daily on all other days) 150 tablet 1 12/06/2016 at pm  . midodrine (PROAMATINE) 10 MG tablet Take 1 tablet (10  mg total) by mouth 3 (three) times daily as needed. (Patient not taking: Reported on 12/07/2016) 90 tablet 6 Not Taking at prn   Scheduled:  . amiodarone  150 mg Intravenous Once  . atorvastatin  10 mg Oral QODAY  . bacitracin   Topical BID  . escitalopram  10 mg Oral Daily  . feeding supplement (GLUCERNA SHAKE)  237 mL Oral TID BM  . heparin  3,000 Units Intravenous Once  . insulin aspart  0-9 Units Subcutaneous TID AC & HS  . insulin detemir  5 Units Subcutaneous QHS  . levothyroxine  112 mcg Oral QAC breakfast  . mouth rinse  15 mL Mouth Rinse BID  . metoprolol tartrate  12.5 mg Oral BID  . mirtazapine  7.5 mg Oral QHS  . mycophenolate  360 mg Oral BID  . pantoprazole (PROTONIX) IV  40 mg Intravenous Q12H  . predniSONE  10 mg Oral Q breakfast  . sodium chloride flush  3 mL Intravenous Q12H  . tacrolimus  1 mg Oral BID  . torsemide  10 mg Oral Daily  . warfarin  2 mg Oral q1800  . Warfarin - Pharmacist Dosing Inpatient   Does not apply q1800    Assessment: Pharmacy consulted to dose and monitor  Heparin therapy in this 76 year old woman with history of atrial fibrillation  Goal of Therapy:  Heparin level 0.3-0.7 units/ml Monitor platelets by anticoagulation protocol: Yes   Plan:  Will give 3000 units of heparin bolus and will start heparin gtt @ 800 units/hr. Will check Heparin level and CBC @ 0530 on 4/26.   4/26 @ 0530 HL 0.81 supratherapeutic. Will decrease heparin rate to 650 units/hr and recheck HL @ 1330.  4/26 13:58 heparin level therapeutic x 1. Continue current rate. Will recheck in 8 hours.  Thank you for this consult.  Nesiah Jump A. Jordan Hawks, PharmD, BCPS Clinical Pharmacist 12/15/2016

## 2016-12-15 NOTE — Progress Notes (Signed)
ANTICOAGULATION CONSULT NOTE - Initial Consult  Pharmacy Consult for Heparin  Indication: rapid heart beat        Allergies  Allergen Reactions  . Azithromycin Other (See Comments)    Upset stomach  . Erythromycin Nausea And Vomiting  . Fentanyl Nausea And Vomiting  . Percocet [Oxycodone-Acetaminophen] Nausea And Vomiting    Patient Measurements: Height: 5\' 3"  (160 cm) Weight: 121 lb (54.9 kg) IBW/kg (Calculated) : 52.4 Heparin Dosing Weight:   Vital Signs: Temp: 98.2 F (36.8 C) (04/25 1923) Temp Source: Oral (04/25 1923) BP: 79/34 (04/25 1948) Pulse Rate: 87 (04/25 1948)  Labs:  Recent Labs (last 2 labs)    Recent Labs  12/12/16 0527 12/13/16 0540 12/14/16 1835  HGB 9.9*  --   --   HCT 30.3*  --   --   PLT 181  --   --   LABPROT 30.3* 19.1*  --   INR 2.83 1.59  --   CREATININE 1.28*  --  1.51*      Estimated Creatinine Clearance: 25.8 mL/min (A) (by C-G formula based on SCr of 1.51 mg/dL (H)).   Medical History:     Past Medical History:  Diagnosis Date  . Adenomatous polyp   . Anemia   . 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 Atrial fibrillation ([place])    a. on Coumadin. No rate-controlling medications with baseline bradycardia.   . Bradycardia   . CAD (coronary artery disease)    a. s/p 1-vessel CABG to LCx in 2001  . Carotid artery stenosis    mild  . Carotid bruit    left  . Chronic kidney disease    s/p cadaveric renal transplant in 2001  . Colitis   . Compression fracture of L4 lumbar vertebra ([facility])   . Coronary artery disease   . CVA (cerebral infarction)   . Diabetes mellitus   . Diabetic retinopathy   . Esophageal candidiasis ([place])   . Flu 11/22/2016  . Fracture of right maxilla 09/21/2012  . Fx ankle   . Fx wrist   . Gastritis   . GERD (gastroesophageal reflux disease)   . H/O immunosuppressive  therapy   . History of orthostatic hypotension   . Hyperlipidemia   . Hyperparathyroidism   . Hypothyroidism   . IBS (irritable bowel syndrome)   . Ischemic colitis ([place])   . Lactose intolerance   . Leg ulcer ([place])    from poor fitting prosthetic  . Melanoma ([place])   . Nephropathy   . 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 Neuropathy   . Osteoporosis    recurrent fractures  . Pancreatic cyst   . Pneumonia 11/2016  . Pulmonary embolism ([place])   . PVD (peripheral vascular disease) ([place])   . Renal artery stenosis ([place])   . 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 Renal osteodystrophy   . Right orbit fracture ([place]) 09/21/2012  . Stroke ([place])   . TBI (traumatic brain injury) ([place]) 09/30/2012  . Tubular adenoma   . Zygoma fracture ([place]) 09/21/2012    Medications:         Prescriptions Prior to Admission  Medication Sig Dispense Refill Last Dose  . acetaminophen (TYLENOL) 500 MG tablet Take 1,000 mg by mouth every 6 (six) hours as needed for moderate pain or headache.    prn at prn  . atorvastatin (LIPITOR) 10  MG tablet Take 1 tablet (10 mg total) by mouth every other day. 30 tablet 1 12/06/2016 at pm  . cholecalciferol (VITAMIN D) 1000 UNITS tablet Take 2,000 Units by mouth daily.    12/07/2016 at 1200  . docusate sodium (COLACE) 100 MG capsule Take 100 mg by mouth daily as needed for moderate constipation.    prn at prn  . escitalopram (LEXAPRO) 10 MG tablet Take 10 mg by mouth daily.   12/07/2016 at am  . ferrous sulfate (SM IRON) 325 (65 FE) MG tablet Take 325 mg by mouth daily.    12/06/2016 at late afternoon  . HYDROcodone-acetaminophen (NORCO/VICODIN) 5-325 MG per tablet Take 2 tablets by mouth 2 (two) times daily as needed for moderate pain.    prn at prn  . hyoscyamine (ANASPAZ) 0.125 MG TBDP disintergrating tablet Place 0.125 mg under the tongue as needed for cramping.    prn at prn  . insulin glargine (LANTUS) 100 UNIT/ML  injection Inject 7 Units into the skin every morning.    12/07/2016 at am  . insulin lispro (HUMALOG) 100 UNIT/ML injection Inject 2-9 Units into the skin 4 (four) times daily -  before meals and at bedtime. Per sliding scale.   prn at prn  . levothyroxine (SYNTHROID, LEVOTHROID) 112 MCG tablet Take 112 mcg by mouth daily before breakfast.   12/07/2016 at am  . magnesium gluconate (MAGONATE) 500 MG tablet Take 1,000 mg by mouth daily.    12/06/2016 at late afternoon  . mirtazapine (REMERON) 7.5 MG tablet Take 7.5 mg by mouth at bedtime.    12/06/2016 at qhs   . Multiple Vitamin (MULTIVITAMIN WITH MINERALS) TABS Take 1 tablet by mouth daily.   12/07/2016 at 1200  . mycophenolate (MYFORTIC) 360 MG TBEC Take 360 mg by mouth 2 (two) times daily.    12/07/2016 at am  . nitroGLYCERIN (NITROSTAT) 0.4 MG SL tablet Place 1 tablet (0.4 mg total) under the tongue every 5 (five) minutes as needed for chest pain. 25 tablet 3 prn at prn  . omeprazole (PRILOSEC) 20 MG capsule Take 20 mg by mouth at bedtime.   12/06/2016 at qhs   . ondansetron (ZOFRAN) 4 MG tablet Take 4 mg by mouth every 8 (eight) hours as needed for nausea or vomiting.    12/07/2016 at 1000  . potassium chloride (K-DUR) 10 MEQ tablet Take 10 mEq by mouth daily at 12 noon.    12/07/2016 at 1200  . predniSONE (DELTASONE) 5 MG tablet Take 5 mg by mouth every morning.    12/07/2016 at am  . promethazine (PHENERGAN) 25 MG tablet Take 1 tablet (25 mg total) by mouth every 6 (six) hours as needed for nausea or vomiting. 30 tablet 1 prn at prn  . tacrolimus (PROGRAF) 0.5 MG capsule Take 1-2 mg by mouth See admin instructions. Take 2mg  in the am & 1mg  at night    12/07/2016 at am  . torsemide (DEMADEX) 20 MG tablet TAKE ONE TABLET BY MOUTH TWICE DAILY AS NEEDED (Patient taking differently: Take 10 mg by mouth at bedtime alternating with 20 mg by mouth at bedtime.) 180 tablet 2 Past Week at Unknown time  . warfarin (COUMADIN) 2.5 MG tablet TAKE  AS DIRECTED BY COUMADIN CLINIC (Patient taking differently: Take 5 mg by mouth daily on Sunday, Tuesday and Thursday. Take 2.5 mg by mouth daily on all other days) 150 tablet 1 12/06/2016 at pm  . midodrine (PROAMATINE) 10 MG tablet Take 1 tablet (10  mg total) by mouth 3 (three) times daily as needed. (Patient not taking: Reported on 12/07/2016) 90 tablet 6 Not Taking at prn   Scheduled:  . amiodarone  150 mg Intravenous Once  . atorvastatin  10 mg Oral QODAY  . bacitracin   Topical BID  . escitalopram  10 mg Oral Daily  . feeding supplement (GLUCERNA SHAKE)  237 mL Oral TID BM  . heparin  3,000 Units Intravenous Once  . insulin aspart  0-9 Units Subcutaneous TID AC & HS  . insulin detemir  5 Units Subcutaneous QHS  . levothyroxine  112 mcg Oral QAC breakfast  . mouth rinse  15 mL Mouth Rinse BID  . metoprolol tartrate  12.5 mg Oral BID  . mirtazapine  7.5 mg Oral QHS  . mycophenolate  360 mg Oral BID  . pantoprazole (PROTONIX) IV  40 mg Intravenous Q12H  . predniSONE  10 mg Oral Q breakfast  . sodium chloride flush  3 mL Intravenous Q12H  . tacrolimus  1 mg Oral BID  . torsemide  10 mg Oral Daily  . warfarin  2 mg Oral q1800  . Warfarin - Pharmacist Dosing Inpatient   Does not apply q1800    Assessment: Pharmacy consulted to dose and monitor  Heparin therapy in this 76 year old woman with history of atrial fibrillation  Goal of Therapy:  Heparin level 0.3-0.7 units/ml Monitor platelets by anticoagulation protocol: Yes   Plan:  Will give 3000 units of heparin bolus and will start heparin gtt @ 800 units/hr. Will check Heparin level and CBC @ 0530 on 4/26.   4/26 @ 0530 HL 0.81 supratherapeutic. Will decrease heparin rate to 650 units/hr and recheck HL @ 1330.  Thank you for this consult.  Tobie Lords, PharmD, BCPS Clinical Pharmacist 12/15/2016

## 2016-12-15 NOTE — Progress Notes (Signed)
Patient Name: Elizabeth [name] Date of Encounter: 12/15/2016  Primary Cardiologist: [facility] Problem List     Active Problems:   AKI (acute kidney injury) ([place])     Subjective   Asked to re-evaluate the patient as she developed Afib with RVR with hear rates into the 1-teens to 130s bpm at 17:35 and was started on amiodarone gtt with initial slowing of ehr Afib to rate controlled followed by conversion to sinus rhythm at 23:01 to sinus bradycardia. Asymptomatic. Restarted Coumadin s/p EGD by GI. HGB slightly down trending to 9.0 this morning. Magnesium 1.4 on 4/25 s/p IV repletion level pending. Potassium 3.6. No TSH recently. No complaints this AM.   Inpatient Medications    Scheduled Meds: . atorvastatin  10 mg Oral QODAY  . bacitracin   Topical BID  . escitalopram  10 mg Oral Daily  . feeding supplement (GLUCERNA SHAKE)  237 mL Oral TID BM  . insulin aspart  0-9 Units Subcutaneous TID AC & HS  . insulin detemir  5 Units Subcutaneous QHS  . levothyroxine  112 mcg Oral QAC breakfast  . mouth rinse  15 mL Mouth Rinse BID  . metoprolol tartrate  12.5 mg Oral BID  . mirtazapine  7.5 mg Oral QHS  . mycophenolate  360 mg Oral BID  . pantoprazole (PROTONIX) IV  40 mg Intravenous Q12H  . predniSONE  10 mg Oral Q breakfast  . sodium chloride flush  10-40 mL Intracatheter Q12H  . sodium chloride flush  3 mL Intravenous Q12H  . tacrolimus  1 mg Oral BID  . torsemide  10 mg Oral Daily  . warfarin  2.5 mg Oral q1800  . Warfarin - Pharmacist Dosing Inpatient   Does not apply q1800   Continuous Infusions: . fluconazole (DIFLUCAN) IV Stopped (12/13/16 1828)  . heparin 800 Units/hr (12/14/16 2247)   PRN Meds: acetaminophen **OR** acetaminophen, albuterol, HYDROcodone-acetaminophen, hyoscyamine, ondansetron **OR** ondansetron (ZOFRAN) IV, polyethylene glycol, sodium chloride flush   Vital Signs    Vitals:   12/15/16 0402 12/15/16 0445 12/15/16 0448 12/15/16 0754  BP:  (!) 121/36  (!) 106/54 114/63  Pulse: (!) 50  (!) 51 (!) 51  Resp:   18 16  Temp:   97.5 F (36.4 C) 97.8 F (36.6 C)  TempSrc:   Oral Oral  SpO2: 100%  100% 100%  Weight:  121 lb (54.9 kg)    Height:        Intake/Output Summary (Last 24 hours) at 12/15/16 0816 Last data filed at 12/15/16 0655  Gross per 24 hour  Intake          1147.74 ml  Output              200 ml  Net           947.74 ml   Filed Weights   12/14/16 0420 12/14/16 1416 12/15/16 0445  Weight: 121 lb 12.8 oz (55.2 kg) 121 lb (54.9 kg) 121 lb (54.9 kg)    Physical Exam    GEN: Frail appearing, in no acute distress.  HEENT: Grossly normal.  Neck: Supple, no JVD, carotid bruits, or masses. Cardiac: Bradycardic, II/VI systolic murmur, no rubs, or gallops. No clubbing, cyanosis, edema.    Respiratory:  Respirations regular and unlabored, clear to auscultation bilaterally. GI: Soft, nontender, nondistended, BS + x 4. MS: No deformity or atrophy. Mild ecchymosis of the upper extremities with skin tears noted. S/p left BKA. Skin: warm and  dry, no rash. Neuro:  Strength and sensation are intact. Psych: AAOx3.  Normal affect.  Labs    CBC  Recent Labs  12/14/16 2002 12/15/16 0528  WBC 4.0 4.0  HGB 9.4* 9.4*  HCT 27.6* 28.2*  MCV 96.9 92.7  PLT 148* 409*   Basic Metabolic Panel  Recent Labs  12/14/16 1835 12/15/16 0528  NA 137 137  K 3.7 3.6  CL 101 99*  CO2 29 32  GLUCOSE 187* 179*  BUN 34* 36*  CREATININE 1.51* 1.68*  CALCIUM 8.6* 8.4*  MG 1.4*  --   PHOS  --  3.0   Liver Function Tests  Recent Labs  12/15/16 0528  ALBUMIN 2.4*   No results for input(s): LIPASE, AMYLASE in the last 72 hours. Cardiac Enzymes No results for input(s): CKTOTAL, CKMB, CKMBINDEX, TROPONINI in the last 72 hours. BNP Invalid input(s): POCBNP D-Dimer No results for input(s): DDIMER in the last 72 hours. Hemoglobin A1C No results for input(s): HGBA1C in the last 72 hours. Fasting Lipid Panel No  results for input(s): CHOL, HDL, LDLCALC, TRIG, CHOLHDL, LDLDIRECT in the last 72 hours. Thyroid Function Tests No results for input(s): TSH, T4TOTAL, T3FREE, THYROIDAB in the last 72 hours.  Invalid input(s): FREET3  Telemetry    Afib with RVR and rate controlled Afib from 17:35 to 23:01, currently in sinus bradycardia with heart rates in the 50s bpm - Personally Reviewed  ECG    Afib with RR, 118 bpm, left axis deviation, occasional PVCs, nonspecific st/t changes - Personally Reviewed  Radiology    Dg Chest 1 View  Result Date: 12/13/2016 IMPRESSION: Small bilateral pleural effusions. No right pneumothorax status post thoracentesis. Electronically Signed   By: Kathreen Devoid   On: 12/13/2016 15:54   Dg Chest Port 1 View  Result Date: 12/14/2016 IMPRESSION: 1. Interval placement of a right-sided PICC with tip over the right atrium close to the cavoatrial junction. No pneumothorax. 2. Cardiomegaly with small bilateral pleural effusions and associated partial compressive atelectasis versus infiltrate of the lung bases. 3. Atherosclerotic calcification of the aorta. Electronically Signed   By: Anner Crete M.D.   On: 12/14/2016 22:51   US Thoracentesis Asp Pleural Space W/img Guide  Result Date: 12/13/2016 IMPRESSION: Successful ultrasound guided right thoracentesis yielding 1000 mL of pleural fluid. Electronically Signed   By: Kathreen Devoid   On: 12/13/2016 15:53    Cardiac Studies   n/a  Patient Profile     76 y.o. female with history of CAD (s/p 1-vessel CABG to LCx in 2001), chronic diastolic CHF (EF 81-19% by echo in 11/2013), ESRD (s/p kidney transplant in 2001), PVD (s/p left BKA), prior CVA and PE (on Coumadin), orthostatic hypotension, recurrent pleural effusions, Type 2 DM and PAF on Coumadin as above who was admitted to [facility] 4/18 with emesis and was initially seen by cardiology for elevated BNP though was felt to be volume depleted. She underwent EGD on 4/25 for  vomiting/dysphagia and developed Afib with RVR in the evening/overnight as above. Started on amiodarone gtt and converted to sinus bradycardia.   Assessment & Plan    1. PAF with RVR: -Developed Afib with RVR at 17:35 through 23:01 -Started on amiodarone gtt and converted to sinus rhythm with bradycardic rates -Has had issues with bradycardia in the past -Was started on Lopressor 12.5 mg bid on 4/25 -Given her bradycardia, will stop amiodarone and titrate Lopressor as needed and as able for heart rate control -Would ideally like to  avoid amiodarone given her immunosuppressive therapy and possible medication interactions  -If she has recurrent Afib may need to discuss this with pharmacy -Initially placed pharmacy consult for medication review of amiodarone, though I discontinued this after seeing the patient has had issues with bradycardia in the past, and will rate control with beta blocker as above for now -Remains on heparin to Coumadin bridge now until therapeutic INR of 2.0-3.0 -CHADS2VASc at least 8 (CHF, age x 2, DM, stroke x 2, vascular disease, female)  2. CAD as above: -No symptoms concerning for angina -On Coumadin in place of ASA -Lopressor as above -Lipitor -No plans for inpatient ischemic evaluation at this time  3. Chronic diastolic CHF: -She does not appear to be volume overloaded at this time -Continue home medications  4. ESRD: -Status post renal transplant in 2001 -Nephrology following  5. Nausea and vomiting: -Status post EGD  6. Hypomagnesemia: -Status post IV repletion  7. Hypokalemia: -Recommend repletion to 4.0, defer to nephrology given renal transplant    Signed, Marcille Blanco [place] Pager: [phone] 12/15/2016, 8:16 AM

## 2016-12-15 NOTE — Progress Notes (Signed)
[facility] NAME: Ziare Orrick    MR#:  [id number]  DATE OF BIRTH:  Nov 20, 1940  SUBJECTIVE:   Barium swallow showed presbyesophagus.  EGD with dilation. Tolerating clears. Wants to eat.  Had Afib overnight and placed on amiodarone. Now in NSR  REVIEW OF SYSTEMS:    Review of Systems  Constitutional: Negative for fever, chills,  +weight loss +Tired and weak HENT: Negative for ear pain, nosebleeds, congestion, facial swelling, rhinorrhea, neck pain, neck stiffness and ear discharge.   Respiratory: Negative for cough, shortness of breath, wheezing  Cardiovascular: Negative for chest pain, palpitations and leg swelling.  Gastrointestinal: Negative for heartburn, abdominal pain, vomiting, diarrhea or consitpation Genitourinary: Negative for dysuria, urgency, frequency, hematuria Musculoskeletal: Negative for back pain or joint pain Neurological: Negative for dizziness, seizures, syncope, focal weakness,  numbness and headaches.  Hematological: Does not bruise/bleed easily.  Psychiatric/Behavioral: Negative for hallucinations, confusion, dysphoric mood  DRUG ALLERGIES:   Allergies  Allergen Reactions  . Azithromycin Other (See Comments)    Upset stomach  . Erythromycin Nausea And Vomiting  . Fentanyl Nausea And Vomiting  . Percocet [Oxycodone-Acetaminophen] Nausea And Vomiting    VITALS:  Blood pressure 114/63, pulse 65, temperature 97.8 F (36.6 C), temperature source Oral, resp. rate 16, height 5\' 3"  (1.6 m), weight 54.9 kg (121 lb), SpO2 100 %.  PHYSICAL EXAMINATION:  Constitutional: Appears well-developed. No distress. HENT: Normocephalic. Marland Kitchen Oropharynx is clear and moist.  Eyes: Conjunctivae and EOM are normal. PERRLA, no scleral icterus. Mucus membranes dry Neck: Normal ROM. Neck supple. No JVD. No tracheal deviation. CVS: RRR, S1/S2 +, no murmurs, no gallops, no carotid bruit.  Pulmonary: Effort and breath sounds  normal, no stridor, rhonchi, wheezes, rales. Abdominal: Soft. BS +,  no distension, tenderness, rebound or guarding.  Musculoskeletal: Normal range of motion. No edema and no tenderness. Left BKA Neuro: Alert. CN 2-12 grossly intact. No focal deficits. Skin: Skin is warm and dry. No rash noted.  Psychiatric: Normal mood and affect.   LABORATORY PANEL:   CBC  Recent Labs Lab 12/15/16 0528  WBC 4.0  HGB 9.4*  HCT 28.2*  PLT 134*   ------------------------------------------------------------------------------------------------------------------  Chemistries   Recent Labs Lab 12/14/16 1835 12/15/16 0528  NA 137 137  K 3.7 3.6  CL 101 99*  CO2 29 32  GLUCOSE 187* 179*  BUN 34* 36*  CREATININE 1.51* 1.68*  CALCIUM 8.6* 8.4*  MG 1.4*  --    ------------------------------------------------------------------------------------------------------------------  Cardiac Enzymes No results for input(s): TROPONINI in the last 168 hours. ------------------------------------------------------------------------------------------------------ RADIOLOGY:  Dg Chest 1 View  Result Date: 12/13/2016 CLINICAL DATA:  Right thoracentesis. EXAM: CHEST 1 VIEW COMPARISON:  12/13/2016 FINDINGS: Small bilateral pleural effusions. Mild right basilar atelectasis. No pneumothorax. Stable cardiomegaly. Prior CABG. No acute osseous abnormality. IMPRESSION: Small bilateral pleural effusions. No right pneumothorax status post thoracentesis. Electronically Signed   By: Kathreen Devoid   On: 12/13/2016 15:54   Dg Chest Port 1 View  Result Date: 12/14/2016 CLINICAL DATA:  76 year old female with PICC line placement. EXAM: PORTABLE CHEST 1 VIEW COMPARISON:  Chest radiograph dated 12/13/2016 FINDINGS: There has been interval placement of a right-sided PICC with tip over the right atrium close to the cavoatrial junction. Small bilateral pleural effusions, right greater left with associated compressive atelectasis of  the lung bases. Pneumonia is not excluded. There is no pneumothorax. Stable cardiac silhouette. Postsurgical changes of CABG. There is atherosclerotic calcification of the aorta.  No acute osseous pathology. IMPRESSION: 1. Interval placement of a right-sided PICC with tip over the right atrium close to the cavoatrial junction. No pneumothorax. 2. Cardiomegaly with small bilateral pleural effusions and associated partial compressive atelectasis versus infiltrate of the lung bases. 3. Atherosclerotic calcification of the aorta. Electronically Signed   By: Anner Crete M.D.   On: 12/14/2016 22:51   US Thoracentesis Asp Pleural Space W/img Guide  Result Date: 12/13/2016 INDICATION: Shortness of breath EXAM: ULTRASOUND GUIDED RIGHT THORACENTESIS MEDICATIONS: None. COMPLICATIONS: None immediate. PROCEDURE: An ultrasound guided thoracentesis was thoroughly discussed with the patient and questions answered. The benefits, risks, alternatives and complications were also discussed. The patient understands and wishes to proceed with the procedure. Written consent was obtained. Ultrasound was performed to localize and mark an adequate pocket of fluid in the right chest. The area was then prepped and draped in the normal sterile fashion. 1% Lidocaine was used for local anesthesia. Under ultrasound guidance a 6 Fr Safe-T-Centesis catheter was introduced. Thoracentesis was performed. The catheter was removed and a dressing applied. FINDINGS: A total of approximately 1000 mL of straw-colored fluid was removed. Samples were sent to the laboratory as requested by the clinical team. IMPRESSION: Successful ultrasound guided right thoracentesis yielding 1000 mL of pleural fluid. Electronically Signed   By: Kathreen Devoid   On: 12/13/2016 15:53   ASSESSMENT AND PLAN:   76 year old female with history of chronic diastolic heart failure and preserved ejection fraction, kidney transplant in 2001 on immunosuppressant medications,  diabetes and PAF who presents with nausea, vomiting and generalized weakness and found to have acute kidney injury.  * Afib with RVR Off amiodarone drip today ON metoprolol 12.5 BID Heparin drip Coumadin  * Vomiting due to  likely candida and aclasia s/p EGD  On IV diflucan. Will change to PO Will stop diflucan if no candidiasis on EGD Tolerating liquids. Advance to regular diet  * DM with hypoglycemia due to insulin and not eating Stopped levemir. Now back on levemir and BS better  * Acute kidney injury on chronic kidney disease stage III - resolved  Prograf level elevated and dosing adjusted Appreciate nephrology consultation.  * Hypothermia: Possibly from hypoglycemia Resolved  * History of renal transplant on immunosuppressive medications: Continue medications   * Chronic diastolic CHF: Patient continues to have pleural effusion however is not symptomatic. Appreciate cardiology consult Torsemide held due to decreased oral intake.  * chronic hypoxic resp failure Due to chf/pleural effusions stable  * Hypothyroid: Continue Synthroid  * CAD Stable  * DVT prophylaxis.  On lovenox  Management plans discussed with the patient and family , in agreement.  CODE STATUS: FULL  TOTAL TIME TAKING CARE OF THIS PATIENT: 35 minutes.   Consult PT  SNF at discharge likely  Neita Carp M.D on 12/15/2016 at 12:36 PM  Between 7am to 6pm - Pager - [phone]  After 6pm go to [email or web address] - password [email or web address] [facility]  Office  [phone]  CC: Primary care physician; Donetta Potts, MD  Note: This dictation was prepared with Dragon dictation along with smaller phrase technology. Any transcriptional errors that result from this process are unintentional.

## 2016-12-16 DIAGNOSIS — R0602 Shortness of breath: Secondary | ICD-10-CM

## 2016-12-16 DIAGNOSIS — R112 Nausea with vomiting, unspecified: Secondary | ICD-10-CM

## 2016-12-16 LAB — GLUCOSE, CAPILLARY
GLUCOSE-CAPILLARY: 131 mg/dL — AB (ref 65–99)
Glucose-Capillary: 40 mg/dL — CL (ref 65–99)
Glucose-Capillary: 60 mg/dL — ABNORMAL LOW (ref 65–99)
Glucose-Capillary: 137 mg/dL — ABNORMAL HIGH (ref 65–99)
Glucose-Capillary: 238 mg/dL — ABNORMAL HIGH (ref 65–99)

## 2016-12-16 LAB — PROTIME-INR
INR: 1.34
PROTHROMBIN TIME: 16.7 s — AB (ref 11.4–15.2)

## 2016-12-16 LAB — TACROLIMUS LEVEL: Tacrolimus (FK506) - LabCorp: 18 ng/mL (ref 2.0–20.0)

## 2016-12-16 LAB — HEPARIN LEVEL (UNFRACTIONATED): Heparin Unfractionated: 0.47 IU/mL (ref 0.30–0.70)

## 2016-12-16 MED ORDER — DEXTROSE 50 % IV SOLN
25.0000 mL | Freq: Once | INTRAVENOUS | Status: AC
  Administered 2016-12-16: 25 mL via INTRAVENOUS
  Filled 2016-12-16: qty 50

## 2016-12-16 MED ORDER — FLUCONAZOLE 100 MG PO TABS: 100.0000 mg | ORAL_TABLET | Freq: Every day | ORAL | 0 refills | Status: DC

## 2016-12-16 MED ORDER — LOPERAMIDE HCL 2 MG PO CAPS: 2.0000 mg | ORAL_CAPSULE | Freq: Four times a day (QID) | ORAL | 0 refills | Status: DC | PRN

## 2016-12-16 MED ORDER — METOPROLOL TARTRATE 25 MG PO TABS: 12.5000 mg | ORAL_TABLET | Freq: Two times a day (BID) | ORAL | Status: DC

## 2016-12-16 MED ORDER — ENOXAPARIN SODIUM 60 MG/0.6ML ~~LOC~~ SOLN
1.0000 mg/kg | SUBCUTANEOUS | Status: DC
  Administered 2016-12-16: 55 mg via SUBCUTANEOUS
  Filled 2016-12-16: qty 0.6

## 2016-12-16 MED ORDER — DEXTROSE-NACL 5-0.9 % IV SOLN
INTRAVENOUS | Status: DC
  Administered 2016-12-16: 08:00:00 via INTRAVENOUS

## 2016-12-16 MED ORDER — LOPERAMIDE HCL 2 MG PO CAPS: 2.0000 mg | ORAL_CAPSULE | Freq: Four times a day (QID) | ORAL | Status: DC | PRN

## 2016-12-16 MED ORDER — ENOXAPARIN SODIUM 60 MG/0.6ML ~~LOC~~ SOLN
55.0000 mg | SUBCUTANEOUS | 0 refills | Status: DC
Start: 2016-12-16 — End: 2017-01-10

## 2016-12-16 MED ORDER — HYDROCODONE-ACETAMINOPHEN 5-325 MG PO TABS: 1.0000 | ORAL_TABLET | Freq: Four times a day (QID) | ORAL | 0 refills | Status: DC | PRN

## 2016-12-16 MED ORDER — GLUCERNA SHAKE PO LIQD: 237.0000 mL | Freq: Three times a day (TID) | ORAL | 0 refills | Status: AC

## 2016-12-16 MED ORDER — OMEPRAZOLE 40 MG PO CPDR: 40.0000 mg | DELAYED_RELEASE_CAPSULE | Freq: Two times a day (BID) | ORAL | Status: DC

## 2016-12-16 MED ORDER — TACROLIMUS 0.5 MG PO CAPS: 1.0000 mg | ORAL_CAPSULE | Freq: Two times a day (BID) | ORAL | Status: DC

## 2016-12-16 MED ORDER — WARFARIN SODIUM 3 MG PO TABS: 3.0000 mg | ORAL_TABLET | Freq: Every day | ORAL | Status: DC

## 2016-12-16 NOTE — Progress Notes (Addendum)
Hypoglycemic Event  CBG: 60  Treatment:Pt refuses any more PO intake 2/2 nausea, 0.5 amp and dextrose will be started.  Symptoms: none  Follow-up CBG: Time:0825 CBG Result:131  Possible Reasons for Event: poor PO intake  Comments/MD notified:MD notified, orders to be entered by MD    Margarita Mail

## 2016-12-16 NOTE — Progress Notes (Addendum)
Patient Name: Elizabeth [name] Date of Encounter: 12/16/2016  Primary Cardiologist: [facility] Problem List     Active Problems:   AKI (acute kidney injury) [facility])   Atrial fibrillation with rapid ventricular response ([place])     Subjective   Reports she is doing well, eating adequate amounts  No complaints ,  Weakness Telemetry reviewed showing sinus bradycardia Discussed with nursing, will place hold parameters on metoprolol   Inpatient Medications    Scheduled Meds: . atorvastatin  10 mg Oral QODAY  . bacitracin   Topical BID  . escitalopram  10 mg Oral Daily  . feeding supplement (GLUCERNA SHAKE)  237 mL Oral TID BM  . fluconazole  100 mg Oral Daily  . insulin aspart  0-9 Units Subcutaneous TID AC & HS  . levothyroxine  112 mcg Oral QAC breakfast  . mouth rinse  15 mL Mouth Rinse BID  . metoprolol tartrate  12.5 mg Oral BID  . mirtazapine  7.5 mg Oral QHS  . mycophenolate  360 mg Oral BID  . pantoprazole  40 mg Oral BID AC  . predniSONE  10 mg Oral Q breakfast  . sodium chloride flush  10-40 mL Intracatheter Q12H  . sodium chloride flush  3 mL Intravenous Q12H  . tacrolimus  1 mg Oral BID  . warfarin  2.5 mg Oral q1800  . Warfarin - Pharmacist Dosing Inpatient   Does not apply q1800   Continuous Infusions: . dextrose 5 % and 0.9% NaCl 50 mL/hr at 12/16/16 0758  . heparin 650 Units/hr (12/15/16 0824)   PRN Meds: acetaminophen **OR** acetaminophen, albuterol, HYDROcodone-acetaminophen, hyoscyamine, ondansetron **OR** ondansetron (ZOFRAN) IV, polyethylene glycol, sodium chloride flush   Vital Signs    Vitals:   12/15/16 1415 12/15/16 1927 12/16/16 0451 12/16/16 0754  BP:  (!) 93/57 (!) 113/55 (!) 117/28  Pulse: (!) 55 (!) 53 (!) 55 (!) 56  Resp:  18 16 20   Temp:  97.8 F (36.6 C) 98.3 F (36.8 C) 98 F (36.7 C)  TempSrc:  Oral Oral Oral  SpO2: 98% 100% 98% 95%  Weight:   122 lb 9.6 oz (55.6 kg)   Height:        Intake/Output Summary (Last  24 hours) at 12/16/16 1009 Last data filed at 12/16/16 0948  Gross per 24 hour  Intake           1276.6 ml  Output              650 ml  Net            626.6 ml   Filed Weights   12/14/16 1416 12/15/16 0445 12/16/16 0451  Weight: 121 lb (54.9 kg) 121 lb (54.9 kg) 122 lb 9.6 oz (55.6 kg)    Physical Exam    GEN: Frail appearing, in no acute distress.  HEENT: Grossly normal.  Neck: Supple, no JVD, carotid bruits, or masses. Cardiac: Bradycardic, II/VI systolic murmur, no rubs, or gallops. No clubbing, cyanosis, edema.    Respiratory:  Respirations regular and unlabored, clear to auscultation bilaterally. GI: Soft, nontender, nondistended, BS + x 4. MS: No deformity or atrophy. Mild ecchymosis of the upper extremities with skin tears noted. S/p left BKA Skin: warm and dry, no rash. Neuro:  Strength and sensation are intact. Psych: AAOx3.  Normal affect.  Labs    CBC  Recent Labs  12/14/16 2002 12/15/16 0528  WBC 4.0 4.0  HGB 9.4* 9.4*  HCT 27.6*  28.2*  MCV 96.9 92.7  PLT 148* 197*   Basic Metabolic Panel  Recent Labs  12/14/16 1835 12/15/16 0528  NA 137 137  K 3.7 3.6  CL 101 99*  CO2 29 32  GLUCOSE 187* 179*  BUN 34* 36*  CREATININE 1.51* 1.68*  CALCIUM 8.6* 8.4*  MG 1.4*  --   PHOS  --  3.0   Liver Function Tests  Recent Labs  12/15/16 0528  ALBUMIN 2.4*   No results for input(s): LIPASE, AMYLASE in the last 72 hours. Cardiac Enzymes No results for input(s): CKTOTAL, CKMB, CKMBINDEX, TROPONINI in the last 72 hours. BNP Invalid input(s): POCBNP D-Dimer No results for input(s): DDIMER in the last 72 hours. Hemoglobin A1C No results for input(s): HGBA1C in the last 72 hours. Fasting Lipid Panel No results for input(s): CHOL, HDL, LDLCALC, TRIG, CHOLHDL, LDLDIRECT in the last 72 hours. Thyroid Function Tests No results for input(s): TSH, T4TOTAL, T3FREE, THYROIDAB in the last 72 hours.  Invalid input(s): FREET3  Telemetry    Normal sinus  rhythm, rate 55  ECG      Radiology    Dg Chest 1 View  Result Date: 12/13/2016 IMPRESSION: Small bilateral pleural effusions. No right pneumothorax status post thoracentesis. Electronically Signed   By: Kathreen Devoid   On: 12/13/2016 15:54   Dg Chest Port 1 View  Result Date: 12/14/2016 IMPRESSION: 1. Interval placement of a right-sided PICC with tip over the right atrium close to the cavoatrial junction. No pneumothorax. 2. Cardiomegaly with small bilateral pleural effusions and associated partial compressive atelectasis versus infiltrate of the lung bases. 3. Atherosclerotic calcification of the aorta. Electronically Signed   By: Anner Crete M.D.   On: 12/14/2016 22:51   US Thoracentesis Asp Pleural Space W/img Guide  Result Date: 12/13/2016 IMPRESSION: Successful ultrasound guided right thoracentesis yielding 1000 mL of pleural fluid. Electronically Signed   By: Kathreen Devoid   On: 12/13/2016 15:53    Cardiac Studies   n/a  Patient Profile     76 y.o. female with history of CAD (s/p 1-vessel CABG to LCx in 2001), chronic diastolic CHF (EF 58-83% by echo in 11/2013), ESRD (s/p kidney transplant in 2001), PVD (s/p left BKA), prior CVA and PE (on Coumadin), orthostatic hypotension, recurrent pleural effusions, Type 2 DM and PAF on Coumadin as above who was admitted to [facility] 4/18 with emesis and was initially seen by cardiology for elevated BNP though was felt to be volume depleted. She underwent EGD on 4/25 for vomiting/dysphagia and developed Afib with RVR in the evening/overnight as above. Started on amiodarone gtt and converted to sinus bradycardia.   Assessment & Plan    1. PAF with RVR: Maintaining NSR, Bradycardia On low-dose beta blocker. Will place hold parameters  Amiodarone held given her immunosuppressive therapy and possible medication interactions  -If she has recurrent Afib may need alternate antiarrhythmic on heparin to Coumadin bridge now until therapeutic INR  of 2.0-3.0 -CHADS2VASc at least 8 (CHF, age x 2, DM, stroke x 2, vascular disease, female)  2. CAD as above: -No symptoms concerning for angina -On Coumadin in place of ASA -Lopressor as above -Lipitor  3. Chronic diastolic CHF: Worsening renal failure, on low rate IV fluids  4. ESRD: -Status post renal transplant in 2001 -Nephrology following  5. Nausea and vomiting: -Status post EGD Feeling better, eating well   Signed, Signed, Esmond Plants, MD, Ph.D [facility] 12/16/2016, 10:09 AM

## 2016-12-16 NOTE — Progress Notes (Signed)
Subjective:  No new renal function testing available today. Patient now taken off of fluconazole.  Objective:  Vital signs in last 24 hours:  Temp:  [97.4 F (36.3 C)-98.3 F (36.8 C)] 97.4 F (36.3 C) (04/27 1205) Pulse Rate:  [48-56] 48 (04/27 1205) Resp:  [16-20] 18 (04/27 1205) BP: (93-117)/(28-57) 109/35 (04/27 1205) SpO2:  [89 %-100 %] 99 % (04/27 1205) Weight:  [55.6 kg (122 lb 9.6 oz)] 55.6 kg (122 lb 9.6 oz) (04/27 0451)  Weight change: 0.726 kg (1 lb 9.6 oz) Filed Weights   12/14/16 1416 12/15/16 0445 12/16/16 0451  Weight: 54.9 kg (121 lb) 54.9 kg (121 lb) 55.6 kg (122 lb 9.6 oz)    Intake/Output:    Intake/Output Summary (Last 24 hours) at 12/16/16 1444 Last data filed at 12/16/16 1428  Gross per 24 hour  Intake           1516.6 ml  Output              550 ml  Net            966.6 ml     Physical Exam: General: Chronically ill-appearing, lying in the bed  HEENT Anicteric, moist oral mucous membranes  Neck supple  Pulm/lungs Mild basilar crackles at left base  CVS/Heart Irregular, no rubs  Abdomen:  Soft, nontender, BS present  Extremities: + dependent edema  Neurologic: Alert, able to answer questions  Skin: Scattered ecchymoses          Basic Metabolic Panel:   Recent Labs Lab 12/10/16 0617 12/11/16 0625 12/12/16 0527 12/14/16 1835 12/15/16 0528  NA 135 134* 138 137 137  K 5.3* 5.3* 4.6 3.7 3.6  CL 103 101 103 101 99*  CO2 28 29 30 29  32  GLUCOSE 181* 224* 205* 187* 179*  BUN 35* 30* 29* 34* 36*  CREATININE 1.56* 1.20* 1.28* 1.51* 1.68*  CALCIUM 8.4* 8.5* 8.7* 8.6* 8.4*  MG  --  2.3  --  1.4*  --   PHOS  --   --   --   --  3.0     CBC:  Recent Labs Lab 12/10/16 0617 12/11/16 0625 12/12/16 0527 12/14/16 2002 12/15/16 0528  WBC 3.2* 4.1 3.8 4.0 4.0  NEUTROABS  --  3.6 3.3  --   --   HGB 9.5* 10.5* 9.9* 9.4* 9.4*  HCT 29.2* 31.8* 30.3* 27.6* 28.2*  MCV 88.0 89.7 90.1 96.9 92.7  PLT 188 185 181 148* 134*       Microbiology:  Recent Results (from the past 720 hour(s))  Susceptibility, Aer + Anaerob     Status: None   Collection Time: 11/22/16 12:00 AM  Result Value Ref Range Status   Suscept, Aer + Anaerob Final report  Corrected    Comment: (NOTE) Performed At: [facility] [place], [place] [id number] Lindon Romp MD [id number] CORRECTED ON 04/13 AT 6314: PREVIOUSLY REPORTED AS Preliminary report    Source of Sample URINE/ ENTEROCOCCUS FAECIUM SUSCEPTIBILITY  Final  Susceptibility Result     Status: None   Collection Time: 11/22/16 12:00 AM  Result Value Ref Range Status   Suscept Result 1 Enterococcus faecium  Final    Comment: (NOTE) Identification performed by account, not confirmed by this laboratory. Ciprofloxacin     >2      Resistant Nitrofurantoin    64      Intermediate Levofloxacin      >4      Resistant  Tetracycline      >8      Resistant Rifampin         <=1      Susceptible    Antimicrobial Suscept Comment  Final    Comment: (NOTE)      ** S = Susceptible; I = Intermediate; R = Resistant **                   P = Positive; N = Negative            MICS are expressed in micrograms per mL   Antibiotic                 RSLT#1    RSLT#2    RSLT#3    RSLT#4 Penicillin                     R Vancomycin                     R Performed At: [facility] [place], [place] [id number] Lindon Romp MD [id number]   Blood culture (routine x 2)     Status: None   Collection Time: 11/22/16 12:52 PM  Result Value Ref Range Status   Specimen Description BLOOD RIGHT ANTECUBITAL  Final   Special Requests   Final    BOTTLES DRAWN AEROBIC AND ANAEROBIC Blood Culture adequate volume   Culture NO GROWTH 5 DAYS  Final   Report Status 11/27/2016 FINAL  Final  Blood culture (routine x 2)     Status: None   Collection Time: 11/22/16 12:54 PM  Result Value Ref Range Status   Specimen Description BLOOD RIGHT FOREARM  Final    Special Requests   Final    BOTTLES DRAWN AEROBIC ONLY Blood Culture adequate volume   Culture NO GROWTH 5 DAYS  Final   Report Status 11/27/2016 FINAL  Final  Urine culture     Status: Abnormal   Collection Time: 11/22/16  3:44 PM  Result Value Ref Range Status   Specimen Description URINE, CATHETERIZED  Final   Special Requests NONE  Final   Culture (A)  Final    80,000 COLONIES/mL ENTEROCOCCUS FAECIUM SEE SEPARATE REPORT FOR SUSCEPTIBILITY RESULTS Performed at [place], READ BACK BY AND VERIFIED WITH: R SAWYER,RN AT 1221 11/26/16 BY L BENFIELD CONCERNING DELAY IN RESULTS    Report Status 12/05/2016 FINAL  Final  Gram stain     Status: None   Collection Time: 11/24/16  1:06 PM  Result Value Ref Range Status   Specimen Description Pleural R  Final   Special Requests NONE  Final   Gram Stain   Final    CYTOSPIN SMEAR WBC PRESENT,BOTH PMN AND MONONUCLEAR NO ORGANISMS SEEN    Report Status 11/24/2016 FINAL  Final  Culture, body fluid-bottle     Status: None   Collection Time: 11/24/16  1:06 PM  Result Value Ref Range Status   Specimen Description PLEURAL  Final   Special Requests NONE  Final   Culture NO GROWTH 5 DAYS  Final   Report Status 11/29/2016 FINAL  Final  MRSA PCR Screening     Status: None   Collection Time: 11/24/16  6:04 PM  Result Value Ref Range Status   MRSA by PCR NEGATIVE NEGATIVE Final    Comment:        The GeneXpert MRSA Assay (FDA approved for NASAL specimens only), is  one component of a comprehensive MRSA colonization surveillance program. It is not intended to diagnose MRSA infection nor to guide or monitor treatment for MRSA infections.   Culture, blood (routine x 2)     Status: None   Collection Time: 12/07/16  6:40 PM  Result Value Ref Range Status   Specimen Description BLOOD LEFT HAND  Final   Special Requests   Final    BOTTLES DRAWN AEROBIC AND ANAEROBIC Blood Culture results may not be optimal due to an  excessive volume of blood received in culture bottles   Culture NO GROWTH 5 DAYS  Final   Report Status 12/12/2016 FINAL  Final  Culture, blood (routine x 2)     Status: Abnormal   Collection Time: 12/07/16  6:40 PM  Result Value Ref Range Status   Specimen Description BLOOD  R AC  Final   Special Requests BOTTLES DRAWN AEROBIC AND ANAEROBIC BCAV  Final   Culture  Setup Time   Final    GRAM POSITIVE COCCI AEROBIC BOTTLE ONLY CRITICAL RESULT CALLED TO, READ BACK BY AND VERIFIED WITH: Matt McBane @ [phone] 12/09/16 by [facility]    Culture (A)  Final    STAPHYLOCOCCUS SPECIES (COAGULASE NEGATIVE) THE SIGNIFICANCE OF ISOLATING THIS ORGANISM FROM A SINGLE SET OF BLOOD CULTURES WHEN MULTIPLE SETS ARE DRAWN IS UNCERTAIN. PLEASE NOTIFY THE MICROBIOLOGY DEPARTMENT WITHIN ONE WEEK IF SPECIATION AND SENSITIVITIES ARE REQUIRED. Performed at [facility], [place] [address] [place], [place] [address]    Report Status 12/11/2016 FINAL  Final  Blood Culture ID Panel (Reflexed)     Status: None   Collection Time: 12/07/16  6:40 PM  Result Value Ref Range Status   Enterococcus species NOT DETECTED NOT DETECTED Final   Listeria monocytogenes NOT DETECTED NOT DETECTED Final   Staphylococcus species NOT DETECTED NOT DETECTED Final   Staphylococcus aureus NOT DETECTED NOT DETECTED Final   Streptococcus species NOT DETECTED NOT DETECTED Final   Streptococcus agalactiae NOT DETECTED NOT DETECTED Final   Streptococcus pneumoniae NOT DETECTED NOT DETECTED Final   Streptococcus pyogenes NOT DETECTED NOT DETECTED Final   Acinetobacter baumannii NOT DETECTED NOT DETECTED Final   Enterobacteriaceae species NOT DETECTED NOT DETECTED Final   Enterobacter cloacae complex NOT DETECTED NOT DETECTED Final   Escherichia coli NOT DETECTED NOT DETECTED Final   Klebsiella oxytoca NOT DETECTED NOT DETECTED Final   Klebsiella pneumoniae NOT DETECTED NOT DETECTED Final   Proteus species NOT DETECTED NOT DETECTED Final    Serratia marcescens NOT DETECTED NOT DETECTED Final   Haemophilus influenzae NOT DETECTED NOT DETECTED Final   Neisseria meningitidis NOT DETECTED NOT DETECTED Final   Pseudomonas aeruginosa NOT DETECTED NOT DETECTED Final   Candida albicans NOT DETECTED NOT DETECTED Final   Candida glabrata NOT DETECTED NOT DETECTED Final   Candida krusei NOT DETECTED NOT DETECTED Final   Candida parapsilosis NOT DETECTED NOT DETECTED Final   Candida tropicalis NOT DETECTED NOT DETECTED Final  Culture, blood (Routine X 2) w Reflex to ID Panel     Status: None   Collection Time: 12/09/16  9:29 AM  Result Value Ref Range Status   Specimen Description BLOOD RIGHT HAND  Final   Special Requests   Final    BOTTLES DRAWN AEROBIC AND ANAEROBIC Blood Culture adequate volume   Culture NO GROWTH 5 DAYS  Final   Report Status 12/14/2016 FINAL  Final  Culture, blood (Routine X 2) w Reflex to ID Panel  Status: None   Collection Time: 12/09/16 11:51 AM  Result Value Ref Range Status   Specimen Description BLOOD RIGHT HAND  Final   Special Requests   Final    BOTTLES DRAWN AEROBIC AND ANAEROBIC Blood Culture adequate volume   Culture NO GROWTH 5 DAYS  Final   Report Status 12/14/2016 FINAL  Final    Coagulation Studies:  Recent Labs  12/15/16 0528 12/16/16 0451  LABPROT 15.5* 16.7*  INR 1.22 1.34    Urinalysis: No results for input(s): COLORURINE, LABSPEC, PHURINE, GLUCOSEU, HGBUR, BILIRUBINUR, KETONESUR, PROTEINUR, UROBILINOGEN, NITRITE, LEUKOCYTESUR in the last 72 hours.  Invalid input(s): APPERANCEUR    Imaging: Dg Chest Port 1 View  Result Date: 12/14/2016 CLINICAL DATA:  76 year old female with PICC line placement. EXAM: PORTABLE CHEST 1 VIEW COMPARISON:  Chest radiograph dated 12/13/2016 FINDINGS: There has been interval placement of a right-sided PICC with tip over the right atrium close to the cavoatrial junction. Small bilateral pleural effusions, right greater left with associated  compressive atelectasis of the lung bases. Pneumonia is not excluded. There is no pneumothorax. Stable cardiac silhouette. Postsurgical changes of CABG. There is atherosclerotic calcification of the aorta. No acute osseous pathology. IMPRESSION: 1. Interval placement of a right-sided PICC with tip over the right atrium close to the cavoatrial junction. No pneumothorax. 2. Cardiomegaly with small bilateral pleural effusions and associated partial compressive atelectasis versus infiltrate of the lung bases. 3. Atherosclerotic calcification of the aorta. Electronically Signed   By: Anner Crete M.D.   On: 12/14/2016 22:51     Medications:   . dextrose 5 % and 0.9% NaCl 50 mL/hr at 12/16/16 0758  . heparin 650 Units/hr (12/15/16 0824)   . atorvastatin  10 mg Oral QODAY  . bacitracin   Topical BID  . escitalopram  10 mg Oral Daily  . feeding supplement (GLUCERNA SHAKE)  237 mL Oral TID BM  . fluconazole  100 mg Oral Daily  . insulin aspart  0-9 Units Subcutaneous TID AC & HS  . levothyroxine  112 mcg Oral QAC breakfast  . mouth rinse  15 mL Mouth Rinse BID  . metoprolol tartrate  12.5 mg Oral BID  . mirtazapine  7.5 mg Oral QHS  . mycophenolate  360 mg Oral BID  . pantoprazole  40 mg Oral BID AC  . predniSONE  10 mg Oral Q breakfast  . sodium chloride flush  10-40 mL Intracatheter Q12H  . sodium chloride flush  3 mL Intravenous Q12H  . tacrolimus  1 mg Oral BID  . warfarin  2.5 mg Oral q1800  . Warfarin - Pharmacist Dosing Inpatient   Does not apply q1800   acetaminophen **OR** acetaminophen, albuterol, HYDROcodone-acetaminophen, hyoscyamine, ondansetron **OR** ondansetron (ZOFRAN) IV, polyethylene glycol, sodium chloride flush  Assessment/ Plan:  76 y.o.caucasian female with medical problems of Coronary disease with CABG in 1999-11-24, end-stage renal disease, was on dialysis for 5 years and got deceased donor kidney transplant  in 11-24-1999, stroke x3 with last in 11-24-99, type 2 diabetes, Atrial  fibrillation, peripheral vascular disease with left BKA, right upper extremity DVT requiring warfarin, who was admitted to [facility] on 12/07/2016 for evaluation of weakness, vomiting and diarrhea. Patient was hospitalized 2 weeks ago for pneumonia.  She was positive for influenza B at that time.  1.  Acute kidney injury  2. Chronic kidney disease st  3 Baseline creatinine 1.45 from April 12 3.  Renal transplant status. Deceased donor 1999/11/24. Followed by Dr Arty Baumgartner in [place]  4.  Hyperkalemia  Plan:  Now that the patient is off of fluconazole we will hold off on adjusting tacrolimus.  We are still awaiting tacrolimus level that was ordered yesterday.  Continue D5 normal saline at 50 cc per hour for now.  Continue to monitor renal function daily while here.    LOS: 9 Estefanie Cornforth 4/27/20182:44 PM

## 2016-12-16 NOTE — Progress Notes (Signed)
ANTICOAGULATION CONSULT NOTE - Initial Consult  Pharmacy Consult for Heparin  Indication: rapid heart beat        Allergies  Allergen Reactions  . Azithromycin Other (See Comments)    Upset stomach  . Erythromycin Nausea And Vomiting  . Fentanyl Nausea And Vomiting  . Percocet [Oxycodone-Acetaminophen] Nausea And Vomiting    Patient Measurements: Height: 5\' 3"  (160 cm) Weight: 121 lb (54.9 kg) IBW/kg (Calculated) : 52.4 Heparin Dosing Weight:   Vital Signs: Temp: 98.2 F (36.8 C) (04/25 1923) Temp Source: Oral (04/25 1923) BP: 79/34 (04/25 1948) Pulse Rate: 87 (04/25 1948)  Labs:  Recent Labs (last 2 labs)    Recent Labs  12/12/16 0527 12/13/16 0540 12/14/16 1835  HGB 9.9* --  --   HCT 30.3* --  --   PLT 181 --  --   LABPROT 30.3* 19.1* --   INR 2.83 1.59 --   CREATININE 1.28* --  1.51*      Estimated Creatinine Clearance: 25.8 mL/min (A) (by C-G formula based on SCr of 1.51 mg/dL (H)).   Medical History:     Past Medical History:  Diagnosis Date  . Adenomatous polyp   . Anemia   . 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 Atrial fibrillation ([place])    a. on Coumadin. No rate-controlling medications with baseline bradycardia.   . Bradycardia   . CAD (coronary artery disease)    a. s/p 1-vessel CABG to LCx in 2001  . Carotid artery stenosis    mild  . Carotid bruit    left  . Chronic kidney disease    s/p cadaveric renal transplant in 2001  . Colitis   . Compression fracture of L4 lumbar vertebra ([facility])   . Coronary artery disease   . CVA (cerebral infarction)   . Diabetes mellitus   . Diabetic retinopathy   . Esophageal candidiasis ([place])   . Flu 11/22/2016  . Fracture of right maxilla 09/21/2012  . Fx ankle   . Fx wrist   . Gastritis   . GERD (gastroesophageal reflux disease)   . H/O  immunosuppressive therapy   . History of orthostatic hypotension   . Hyperlipidemia   . Hyperparathyroidism   . Hypothyroidism   . IBS (irritable bowel syndrome)   . Ischemic colitis ([place])   . Lactose intolerance   . Leg ulcer ([place])    from poor fitting prosthetic  . Melanoma ([place])   . Nephropathy   . 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 Neuropathy   . Osteoporosis    recurrent fractures  . Pancreatic cyst   . Pneumonia 11/2016  . Pulmonary embolism ([place])   . PVD (peripheral vascular disease) ([place])   . Renal artery stenosis ([place])   . 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 Renal osteodystrophy   . Right orbit fracture ([place]) 09/21/2012  . Stroke ([place])   . TBI (traumatic brain injury) ([place]) 09/30/2012  . Tubular adenoma   . Zygoma fracture ([place]) 09/21/2012    Medications:         Prescriptions Prior to Admission  Medication Sig Dispense Refill Last Dose  . acetaminophen (TYLENOL) 500 MG tablet Take 1,000 mg by mouth every 6 (six) hours as needed for moderate pain or headache.    prn at prn  . atorvastatin (LIPITOR) 10 MG tablet Take 1 tablet (10 mg total) by  mouth every other day. 30 tablet 1 12/06/2016 at pm  . cholecalciferol (VITAMIN D) 1000 UNITS tablet Take 2,000 Units by mouth daily.    12/07/2016 at 1200  . docusate sodium (COLACE) 100 MG capsule Take 100 mg by mouth daily as needed for moderate constipation.    prn at prn  . escitalopram (LEXAPRO) 10 MG tablet Take 10 mg by mouth daily.   12/07/2016 at am  . ferrous sulfate (SM IRON) 325 (65 FE) MG tablet Take 325 mg by mouth daily.    12/06/2016 at late afternoon  . HYDROcodone-acetaminophen (NORCO/VICODIN) 5-325 MG per tablet Take 2 tablets by mouth 2 (two) times daily as needed for moderate pain.    prn at prn  . hyoscyamine (ANASPAZ) 0.125 MG TBDP disintergrating tablet Place 0.125 mg under the tongue as needed for cramping.    prn at prn  . insulin glargine  (LANTUS) 100 UNIT/ML injection Inject 7 Units into the skin every morning.    12/07/2016 at am  . insulin lispro (HUMALOG) 100 UNIT/ML injection Inject 2-9 Units into the skin 4 (four) times daily - before meals and at bedtime. Per sliding scale.   prn at prn  . levothyroxine (SYNTHROID, LEVOTHROID) 112 MCG tablet Take 112 mcg by mouth daily before breakfast.   12/07/2016 at am  . magnesium gluconate (MAGONATE) 500 MG tablet Take 1,000 mg by mouth daily.    12/06/2016 at late afternoon  . mirtazapine (REMERON) 7.5 MG tablet Take 7.5 mg by mouth at bedtime.    12/06/2016 at qhs   . Multiple Vitamin (MULTIVITAMIN WITH MINERALS) TABS Take 1 tablet by mouth daily.   12/07/2016 at 1200  . mycophenolate (MYFORTIC) 360 MG TBEC Take 360 mg by mouth 2 (two) times daily.    12/07/2016 at am  . nitroGLYCERIN (NITROSTAT) 0.4 MG SL tablet Place 1 tablet (0.4 mg total) under the tongue every 5 (five) minutes as needed for chest pain. 25 tablet 3 prn at prn  . omeprazole (PRILOSEC) 20 MG capsule Take 20 mg by mouth at bedtime.   12/06/2016 at qhs   . ondansetron (ZOFRAN) 4 MG tablet Take 4 mg by mouth every 8 (eight) hours as needed for nausea or vomiting.    12/07/2016 at 1000  . potassium chloride (K-DUR) 10 MEQ tablet Take 10 mEq by mouth daily at 12 noon.    12/07/2016 at 1200  . predniSONE (DELTASONE) 5 MG tablet Take 5 mg by mouth every morning.    12/07/2016 at am  . promethazine (PHENERGAN) 25 MG tablet Take 1 tablet (25 mg total) by mouth every 6 (six) hours as needed for nausea or vomiting. 30 tablet 1 prn at prn  . tacrolimus (PROGRAF) 0.5 MG capsule Take 1-2 mg by mouth See admin instructions. Take 2mg  in the am &1mg  at night    12/07/2016 at am  . torsemide (DEMADEX) 20 MG tablet TAKE ONE TABLET BY MOUTH TWICE DAILY AS NEEDED (Patient taking differently: Take 10 mg by mouth at bedtime alternating with 20 mg by mouth at bedtime.) 180 tablet 2 Past Week at Unknown time  . warfarin  (COUMADIN) 2.5 MG tablet TAKE AS DIRECTED BY COUMADIN CLINIC (Patient taking differently: Take 5 mg by mouth daily on Sunday, Tuesday and Thursday. Take 2.5 mg by mouth daily on all other days) 150 tablet 1 12/06/2016 at pm  . midodrine (PROAMATINE) 10 MG tablet Take 1 tablet (10 mg total) by mouth 3 (three) times daily as needed. (Patient  not taking: Reported on 12/07/2016) 90 tablet 6 Not Taking at prn   Scheduled:  . amiodarone 150 mg Intravenous Once  . atorvastatin 10 mg Oral QODAY  . bacitracin  Topical BID  . escitalopram 10 mg Oral Daily  . feeding supplement (GLUCERNA SHAKE) 237 mL Oral TID BM  . heparin 3,000 Units Intravenous Once  . insulin aspart 0-9 Units Subcutaneous TID AC & HS  . insulin detemir 5 Units Subcutaneous QHS  . levothyroxine 112 mcg Oral QAC breakfast  . mouth rinse 15 mL Mouth Rinse BID  . metoprolol tartrate 12.5 mg Oral BID  . mirtazapine 7.5 mg Oral QHS  . mycophenolate 360 mg Oral BID  . pantoprazole (PROTONIX) IV 40 mg Intravenous Q12H  . predniSONE 10 mg Oral Q breakfast  . sodium chloride flush 3 mL Intravenous Q12H  . tacrolimus 1 mg Oral BID  . torsemide 10 mg Oral Daily  . warfarin 2 mg Oral q1800  . Warfarin - Pharmacist Dosing Inpatient  Does not apply q1800    Assessment: Pharmacy consulted to dose and monitor Heparin therapy in this 76 year old woman with history of atrial fibrillation  Goal of Therapy: Heparin level 0.3-0.7 units/ml Monitor platelets by anticoagulation protocol: Yes  Plan: Will give 3000 units of heparin bolus and will start heparin gtt @ 800 units/hr. Will check Heparin level and CBC @ 0530 on 4/26.   4/26 @ 0530 HL 0.81 supratherapeutic. Will decrease heparin rate to 650 units/hr and recheck HL @ 1330.  4/26 13:58 heparin level therapeutic x 1. Continue current rate. Will recheck in 8 hours.  4/26 @ 2200 HL 0.56 therapeutic. Will check next HL w/ am labs.  4/27 @ 0451 HL 0.47  therapeutic. Will continue current regimen and check next HL w/ am labs.  Thank you for this consult.  Tobie Lords, PharmD, BCPS Clinical Pharmacist 12/16/2016

## 2016-12-16 NOTE — Progress Notes (Signed)
Hypoglycemic Event  CBG: 40  Treatment: 4 oz of juice  Symptoms: asymptomatic  Follow-up CBG: [id number] CBG Result  Possible Reasons for Event: poor PO intake 2/2 nausea  Comments/MD notified:n    Elizabeth [name] C Pellegrino Kennard

## 2016-12-16 NOTE — Progress Notes (Signed)
[facility] for Warfarin  Indication: atrial fibrillation  Allergies  Allergen Reactions  . Azithromycin Other (See Comments)    Upset stomach  . Erythromycin Nausea And Vomiting  . Fentanyl Nausea And Vomiting  . Percocet [Oxycodone-Acetaminophen] Nausea And Vomiting    Patient Measurements: Height: 5\' 3"  (160 cm) Weight: 122 lb 9.6 oz (55.6 kg) IBW/kg (Calculated) : 52.4  Vital Signs: Temp: 98 F (36.7 C) (04/27 0754) Temp Source: Oral (04/27 0754) BP: 117/28 (04/27 0754) Pulse Rate: 56 (04/27 0754)  Labs:  Recent Labs  12/14/16 1835 12/14/16 2002  12/15/16 0528 12/15/16 1358 12/15/16 2156 12/16/16 0451  HGB  --  9.4*  --  9.4*  --   --   --   HCT  --  27.6*  --  28.2*  --   --   --   PLT  --  148*  --  134*  --   --   --   APTT  --  30  --   --   --   --   --   LABPROT  --   --   --  15.5*  --   --  16.7*  INR  --   --   --  1.22  --   --  1.34  HEPARINUNFRC  --   --   < > 0.81* 0.67 0.56 0.47  CREATININE 1.51*  --   --  1.68*  --   --   --   < > = values in this interval not displayed.  Estimated Creatinine Clearance: 23.2 mL/min (A) (by C-G formula based on SCr of 1.68 mg/dL (H)).   Medical History: Past Medical History:  Diagnosis Date  . Adenomatous polyp   . Anemia   . 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 Atrial fibrillation ([place])    a. on Coumadin. No rate-controlling medications with baseline bradycardia.   . Bradycardia   . CAD (coronary artery disease)    a. s/p 1-vessel CABG to LCx in 2001  . Carotid artery stenosis    mild  . Carotid bruit    left  . Chronic kidney disease    s/p cadaveric renal transplant in 2001  . Colitis   . Compression fracture of L4 lumbar vertebra (HCC)   . Coronary artery disease   . CVA (cerebral infarction)   . Diabetes mellitus   . Diabetic retinopathy   . Esophageal  candidiasis ([place])   . Flu 11/22/2016  . Fracture of right maxilla 09/21/2012  . Fx ankle   . Fx wrist   . Gastritis   . GERD (gastroesophageal reflux disease)   . H/O immunosuppressive therapy   . History of orthostatic hypotension   . Hyperlipidemia   . Hyperparathyroidism   . Hypothyroidism   . IBS (irritable bowel syndrome)   . Ischemic colitis ([place])   . Lactose intolerance   . Leg ulcer ([place])    from poor fitting prosthetic  . Melanoma ([place])   . Nephropathy   . 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 Neuropathy   . Osteoporosis    recurrent fractures  . Pancreatic cyst   . Pneumonia 11/2016  . Pulmonary embolism ([place])   . PVD (peripheral vascular disease) ([place])   . Renal artery stenosis ([place])   . 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  Renal osteodystrophy   . Right orbit fracture ([place]) 09/21/2012  . Stroke ([place])   . TBI (traumatic brain injury) ([place]) 09/30/2012  . Tubular adenoma   . Zygoma fracture ([place]) 09/21/2012    Assessment: 76 yo female with PMH history of A. Fib.  Pharmacy consulted for warfarin dosing and monitoring. INR supratherapeutic at 5.51 on admission.   Home Regimen: Warfarin 5mg  Sun, Tues, Thurs                             Warfarin 2.5mg  [place], Fri, Sat   DATE  INR  DOSE 4/18  5.51  HELD 4/19  5.79  HELD 4/20  4.79  HELD 4/21                 4.34                 HELD 4/22                 3.06                 HELD 4/23  2.83  HELD **1 mg subcutaneous Vitamin K 4/23 @ 11:19** 4/24  1.59  HELD 4/25  --  2 mg 4/26   1.22  2.5 mg 4/27  1.34  2.5 mg  Goal of Therapy:  INR 2-3 Monitor platelets by anticoagulation protocol: Yes   Plan:  EGD was inconclusive in regards to candida - patient will remain on fluconazole for now. Switched to oral fluconazole. INR subtherapeutic - continue current dose and monitor INR daily while in patient.  Laural Benes, Pharm.D., BCPS Clinical Pharmacist 12/16/2016 9:38 AM

## 2016-12-16 NOTE — Care Management (Signed)
Spoke with patient's husband and he now is agreeable to pursue skilled nursing placement. Notified CSW.

## 2016-12-16 NOTE — Progress Notes (Signed)
Pt will be discharged to [facility], report called to Mrs Josph Macho at [phone], EMS will transport. Packet will be given to EMS providing transportation.

## 2016-12-16 NOTE — Care Management Important Message (Signed)
Important Message  Patient Details  Name: Elizabeth [name] MRN: [id number] Date of Birth: 1940/10/27   Medicare Important Message Given:  Yes  Signed IM notice given     Katrina Stack, RN 12/16/2016, 3:57 PM

## 2016-12-16 NOTE — Progress Notes (Signed)
Inpatient Diabetes Program Recommendations  AACE/ADA: New Consensus Statement on Inpatient Glycemic Control (2015)  Target Ranges:  Prepandial:   less than 140 mg/dL      [facility] postprandial:   less than 180 mg/dL (1-2 hours)      Critically ill patients:  140 - 180 mg/dL   Results for RAELEEN, WINSTANLEY (MRN [id number]) as of 12/16/2016 08:47  Ref. Range 12/15/2016 07:58 12/15/2016 09:51 12/15/2016 11:43 12/15/2016 16:47 12/15/2016 21:00 12/16/2016 07:29 12/16/2016 07:45 12/16/2016 08:26  Glucose-Capillary Latest Ref Range: 65 - 99 mg/dL 137 (H) 108 (H) 146 (H) 129 (H) 200 (H) 40 (LL) 60 (L) 131 (H)   Review of Glycemic Control  Current orders for Inpatient glycemic control: Novolog 0-9 units ACHS  Inpatient Diabetes Program Recommendations: Insulin - Basal: Noted patient received Levemir 5 units at 22:05 last night and fasting glucose 40 mg/dl today. Levemir has been discontinued. Will continue to follow.  Thanks, Barnie Alderman, RN, MSN, CDE Diabetes Coordinator Inpatient Diabetes Program [phone] (Team Pager from 8am to 5pm)

## 2016-12-16 NOTE — Discharge Summary (Signed)
[facility] NAME: Elizabeth [name]    MR#:  [id number]  DATE OF BIRTH:  1941-07-02  DATE OF ADMISSION:  12/07/2016 ADMITTING PHYSICIAN: Hillary Bow, MD  DATE OF DISCHARGE: 12/16/2016  PRIMARY CARE PHYSICIAN: Donetta Potts, MD   ADMISSION DIAGNOSIS:  AKI (acute kidney injury) ([place]) [N17.9] Abdominal pain [R10.9] Non-intractable vomiting with nausea, unspecified vomiting type [R11.2]  DISCHARGE DIAGNOSIS:  Active Problems:   AKI (acute kidney injury) ([place])   Atrial fibrillation with rapid ventricular response ([place])   SECONDARY DIAGNOSIS:   Past Medical History:  Diagnosis Date  . Adenomatous polyp   . Anemia   . 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 Atrial fibrillation ([place])    a. on Coumadin. No rate-controlling medications with baseline bradycardia.   . Bradycardia   . CAD (coronary artery disease)    a. s/p 1-vessel CABG to LCx in 2001  . Carotid artery stenosis    mild  . Carotid bruit    left  . Chronic kidney disease    s/p cadaveric renal transplant in 2001  . Colitis   . Compression fracture of L4 lumbar vertebra (HCC)   . Coronary artery disease   . CVA (cerebral infarction)   . Diabetes mellitus   . Diabetic retinopathy   . Esophageal candidiasis ([place])   . Flu 11/22/2016  . Fracture of right maxilla 09/21/2012  . Fx ankle   . Fx wrist   . Gastritis   . GERD (gastroesophageal reflux disease)   . H/O immunosuppressive therapy   . History of orthostatic hypotension   . Hyperlipidemia   . Hyperparathyroidism   . Hypothyroidism   . IBS (irritable bowel syndrome)   . Ischemic colitis ([place])   . Lactose intolerance   . Leg ulcer ([place])    from poor fitting prosthetic  . Melanoma ([place])   . Nephropathy   . 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 Neuropathy   . Osteoporosis    recurrent fractures  .  Pancreatic cyst   . Pneumonia 11/2016  . Pulmonary embolism ([place])   . PVD (peripheral vascular disease) ([place])   . Renal artery stenosis ([place])   . 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 Renal osteodystrophy   . Right orbit fracture ([place]) 09/21/2012  . Stroke ([place])   . TBI (traumatic brain injury) ([place]) 09/30/2012  . Tubular adenoma   . Zygoma fracture ([place]) 09/21/2012   ADMITTING HISTORY  HISTORY OF PRESENT ILLNESS:  Elizabeth [name]  is a 76 y.o. female with a known history of Hypertension, diabetes, chronic diastolic CHF, kidney transplant on immunosuppressants, CKD stage III, history of PE presents to the emergency room from home due to weakness, vomiting and diarrhea. Patient does have chronic on and off nausea, vomiting and diarrhea but it is significantly worse. She was recently admitted to [facility] in [place] for similar complaints along with influenza B and shortness of breath. She had right thoracentesis with 1 L transudate removed. Treated with Tamiflu and Levaquin. Finished antibiotics in the hospital. She continued to have nausea and vomiting along with diarrhea on day of discharge but was told this was due to gastroparesis and influenza. He was continued and remain the same. She has not taken her torsemide as she was concerned about dehydration. Other medications have remained the same. With the  home health nurse blood pressure was noticed to be low in systolic of 97W. Here in the emergency room patient has been found to have acute kidney injury. Chest x-ray shows right-sided moderate pleural effusion and a small left pleural effusion. The last [facility] at [facility] showed bilateral moderate pleural effusions. She is also on oxygen at home since recent admission.  Normally her weight is 130 pounds. Today she is 117 pounds.   HOSPITAL COURSE:   76 year old female with history of chronic diastolic heart failure and preserved ejection fraction, kidney transplant  in 2001 on immunosuppressant medications, diabetes and PAF who presents with nausea, vomiting and generalized weakness and found to have acute kidney injury.  * Afib with RVR Was on amiodarone drip and stopped Now On metoprolol 12.5 BID Heparin drip in hospital Lovenox bridge for 4 days. Stop lovenox once INR 2-3 Coumadin 3 mg daily  * Vomiting due to likely candida and aclasia s/p EGD  On diflucan.  Tolerating diet f/u with Dr. Vicente Males in 7-10 days  * DM with hypoglycemia due to insulin and not eating Stopped levemir. Continue sliding scale  * Acute kidney injury on chronic kidney disease stage III - resolved  Prograf level elevated and dosing adjusted Appreciate nephrology consultation. Need Tacrolimus adjusted per Dr. Lamar Laundry  * Hypothermia: Possibly from hypoglycemia Resolved  * History of renal transplant on immunosuppressive medications: Continue medications   * Chronic diastolic CHF: Patient continues to have pleural effusion however is not symptomatic. Appreciate cardiology consult Torsemide held due to decreased oral intake.  * chronic hypoxic resp failure Due to chf/pleural effusions stable  * Hypothyroid: Continue Synthroid  * CAD Stable  * Right pleural effusion Thoracentesis done. 1 liters fluid   Diabetic diet with chopped meats. If unable to tolerate will need to change to pureed food.  Check INR in 2 and 4 days. Stop lovenox when INR >2. Target levels are 2-3. Tacrolimus level on Day 4  She will need change in Tacrolimus dosing per her Nephrologist( Dr. Lamar Laundry) once diflucan dosing is finished.  Sliding scale for Novolog before meals CBG 70 - 120: 0 units   CBG 121 - 150: 1 unit   CBG 151 - 200: 2 units   CBG 201 - 250: 3 units   CBG 251 - 300: 5 units   CBG 301 - 350: 7 units   CBG 351 - 400 9 units     Accu checks Ac and HS   Discussed with patient and family prior to discharge. Want PICC to stay in spite of  recommending removal due to risk of infection. They are aware and still want to keep PICC line in. Needs to be removed at the earliest   CONSULTS OBTAINED:  Treatment Team:  Murlean Iba, MD [place], MD  DRUG ALLERGIES:   Allergies  Allergen Reactions  . Azithromycin Other (See Comments)    Upset stomach  . Erythromycin Nausea And Vomiting  . Fentanyl Nausea And Vomiting  . Percocet [Oxycodone-Acetaminophen] Nausea And Vomiting    DISCHARGE MEDICATIONS:   Current Discharge Medication List    START taking these medications   Details  enoxaparin (LOVENOX) 60 MG/0.6ML injection Inject 0.55 mLs (55 mg total) into the skin daily. Qty: 4 Syringe, Refills: 0    feeding supplement, GLUCERNA SHAKE, (GLUCERNA SHAKE) LIQD Take 237 mLs by mouth 3 (three) times daily between meals. Refills: 0    fluconazole (DIFLUCAN) 100 MG tablet Take 1 tablet (100 mg total)  by mouth daily. Qty: 10 tablet, Refills: 0    loperamide (IMODIUM) 2 MG capsule Take 1 capsule (2 mg total) by mouth every 6 (six) hours as needed for diarrhea or loose stools. Qty: 30 capsule, Refills: 0    metoprolol tartrate (LOPRESSOR) 25 MG tablet Take 0.5 tablets (12.5 mg total) by mouth 2 (two) times daily.      CONTINUE these medications which have CHANGED   Details  HYDROcodone-acetaminophen (NORCO/VICODIN) 5-325 MG tablet Take 1-2 tablets by mouth every 6 (six) hours as needed for severe pain. Qty: 20 tablet, Refills: 0    omeprazole (PRILOSEC) 40 MG capsule Take 1 capsule (40 mg total) by mouth 2 (two) times daily before a meal.    tacrolimus (PROGRAF) 0.5 MG capsule Take 2 capsules (1 mg total) by mouth 2 (two) times daily. Take 2mg  in the am & 1mg  at night    warfarin (COUMADIN) 3 MG tablet Take 1 tablet (3 mg total) by mouth daily at 6 PM.      CONTINUE these medications which have NOT CHANGED   Details  acetaminophen (TYLENOL) 500 MG tablet Take 1,000 mg by mouth every 6 (six) hours as needed  for moderate pain or headache.     atorvastatin (LIPITOR) 10 MG tablet Take 1 tablet (10 mg total) by mouth every other day. Qty: 30 tablet, Refills: 1    cholecalciferol (VITAMIN D) 1000 UNITS tablet Take 2,000 Units by mouth daily.     docusate sodium (COLACE) 100 MG capsule Take 100 mg by mouth daily as needed for moderate constipation.     escitalopram (LEXAPRO) 10 MG tablet Take 10 mg by mouth daily.    ferrous sulfate (SM IRON) 325 (65 FE) MG tablet Take 325 mg by mouth daily.     hyoscyamine (ANASPAZ) 0.125 MG TBDP disintergrating tablet Place 0.125 mg under the tongue as needed for cramping.     insulin lispro (HUMALOG) 100 UNIT/ML injection Inject 2-9 Units into the skin 4 (four) times daily -  before meals and at bedtime. Per sliding scale.    levothyroxine (SYNTHROID, LEVOTHROID) 112 MCG tablet Take 112 mcg by mouth daily before breakfast.    magnesium gluconate (MAGONATE) 500 MG tablet Take 1,000 mg by mouth daily.     mirtazapine (REMERON) 7.5 MG tablet Take 7.5 mg by mouth at bedtime.     Multiple Vitamin (MULTIVITAMIN WITH MINERALS) TABS Take 1 tablet by mouth daily.    mycophenolate (MYFORTIC) 360 MG TBEC Take 360 mg by mouth 2 (two) times daily.     nitroGLYCERIN (NITROSTAT) 0.4 MG SL tablet Place 1 tablet (0.4 mg total) under the tongue every 5 (five) minutes as needed for chest pain. Qty: 25 tablet, Refills: 3    ondansetron (ZOFRAN) 4 MG tablet Take 4 mg by mouth every 8 (eight) hours as needed for nausea or vomiting.     predniSONE (DELTASONE) 5 MG tablet Take 5 mg by mouth every morning.     promethazine (PHENERGAN) 25 MG tablet Take 1 tablet (25 mg total) by mouth every 6 (six) hours as needed for nausea or vomiting. Qty: 30 tablet, Refills: 1    torsemide (DEMADEX) 20 MG tablet TAKE ONE TABLET BY MOUTH TWICE DAILY AS NEEDED Qty: 180 tablet, Refills: 2      STOP taking these medications     insulin glargine (LANTUS) 100 UNIT/ML injection       potassium chloride (K-DUR) 10 MEQ tablet      midodrine (PROAMATINE)  10 MG tablet         Today   VITAL SIGNS:  Blood pressure (!) 109/35, pulse (!) 48, temperature 97.4 F (36.3 C), temperature source Oral, resp. rate 18, height 5\' 3"  (1.6 m), weight 55.6 kg (122 lb 9.6 oz), SpO2 99 %.  I/O:   Intake/Output Summary (Last 24 hours) at 12/16/16 1549 Last data filed at 12/16/16 1428  Gross per 24 hour  Intake           1516.6 ml  Output              550 ml  Net            966.6 ml    PHYSICAL EXAMINATION:  Physical Exam  GENERAL:  76 y.o.-year-old patient lying in the bed with no acute distress.  LUNGS: Normal breath sounds bilaterally, no wheezing, rales,rhonchi or crepitation. No use of accessory muscles of respiration.  CARDIOVASCULAR: S1, S2 normal. No murmurs, rubs, or gallops.  ABDOMEN: Soft, non-tender, non-distended. Bowel sounds present. No organomegaly or mass.  NEUROLOGIC: Moves all 4 extremities. PSYCHIATRIC: The patient is alert and oriented x 3.  SKIN: Bruising on arms RLE splint and LLE prosthetic leg  DATA REVIEW:   CBC  Recent Labs Lab 12/15/16 0528  WBC 4.0  HGB 9.4*  HCT 28.2*  PLT 134*    Chemistries   Recent Labs Lab 12/14/16 1835 12/15/16 0528  NA 137 137  K 3.7 3.6  CL 101 99*  CO2 29 32  GLUCOSE 187* 179*  BUN 34* 36*  CREATININE 1.51* 1.68*  CALCIUM 8.6* 8.4*  MG 1.4*  --     Cardiac Enzymes No results for input(s): TROPONINI in the last 168 hours.  Microbiology Results  Results for orders placed or performed during the hospital encounter of 12/07/16  Culture, blood (routine x 2)     Status: None   Collection Time: 12/07/16  6:40 PM  Result Value Ref Range Status   Specimen Description BLOOD LEFT HAND  Final   Special Requests   Final    BOTTLES DRAWN AEROBIC AND ANAEROBIC Blood Culture results may not be optimal due to an excessive volume of blood received in culture bottles   Culture NO GROWTH 5 DAYS  Final   Report  Status 12/12/2016 FINAL  Final  Culture, blood (routine x 2)     Status: Abnormal   Collection Time: 12/07/16  6:40 PM  Result Value Ref Range Status   Specimen Description BLOOD  R AC  Final   Special Requests BOTTLES DRAWN AEROBIC AND ANAEROBIC BCAV  Final   Culture  Setup Time   Final    GRAM POSITIVE COCCI AEROBIC BOTTLE ONLY CRITICAL RESULT CALLED TO, READ BACK BY AND VERIFIED WITH: Matt McBane @ 3710 12/09/16 by [facility]    Culture (A)  Final    STAPHYLOCOCCUS SPECIES (COAGULASE NEGATIVE) THE SIGNIFICANCE OF ISOLATING THIS ORGANISM FROM A SINGLE SET OF BLOOD CULTURES WHEN MULTIPLE SETS ARE DRAWN IS UNCERTAIN. PLEASE NOTIFY THE MICROBIOLOGY DEPARTMENT WITHIN ONE WEEK IF SPECIATION AND SENSITIVITIES ARE REQUIRED. Performed at [facility], [place] [address] [place], [place] [address]    Report Status 12/11/2016 FINAL  Final  Blood Culture ID Panel (Reflexed)     Status: None   Collection Time: 12/07/16  6:40 PM  Result Value Ref Range Status   Enterococcus species NOT DETECTED NOT DETECTED Final   Listeria monocytogenes NOT DETECTED NOT DETECTED Final   Staphylococcus species  NOT DETECTED NOT DETECTED Final   Staphylococcus aureus NOT DETECTED NOT DETECTED Final   Streptococcus species NOT DETECTED NOT DETECTED Final   Streptococcus agalactiae NOT DETECTED NOT DETECTED Final   Streptococcus pneumoniae NOT DETECTED NOT DETECTED Final   Streptococcus pyogenes NOT DETECTED NOT DETECTED Final   Acinetobacter baumannii NOT DETECTED NOT DETECTED Final   Enterobacteriaceae species NOT DETECTED NOT DETECTED Final   Enterobacter cloacae complex NOT DETECTED NOT DETECTED Final   Escherichia coli NOT DETECTED NOT DETECTED Final   Klebsiella oxytoca NOT DETECTED NOT DETECTED Final   Klebsiella pneumoniae NOT DETECTED NOT DETECTED Final   Proteus species NOT DETECTED NOT DETECTED Final   Serratia marcescens NOT DETECTED NOT DETECTED Final   Haemophilus influenzae NOT DETECTED NOT DETECTED  Final   Neisseria meningitidis NOT DETECTED NOT DETECTED Final   Pseudomonas aeruginosa NOT DETECTED NOT DETECTED Final   Candida albicans NOT DETECTED NOT DETECTED Final   Candida glabrata NOT DETECTED NOT DETECTED Final   Candida krusei NOT DETECTED NOT DETECTED Final   Candida parapsilosis NOT DETECTED NOT DETECTED Final   Candida tropicalis NOT DETECTED NOT DETECTED Final  Culture, blood (Routine X 2) w Reflex to ID Panel     Status: None   Collection Time: 12/09/16  9:29 AM  Result Value Ref Range Status   Specimen Description BLOOD RIGHT HAND  Final   Special Requests   Final    BOTTLES DRAWN AEROBIC AND ANAEROBIC Blood Culture adequate volume   Culture NO GROWTH 5 DAYS  Final   Report Status 12/14/2016 FINAL  Final  Culture, blood (Routine X 2) w Reflex to ID Panel     Status: None   Collection Time: 12/09/16 11:51 AM  Result Value Ref Range Status   Specimen Description BLOOD RIGHT HAND  Final   Special Requests   Final    BOTTLES DRAWN AEROBIC AND ANAEROBIC Blood Culture adequate volume   Culture NO GROWTH 5 DAYS  Final   Report Status 12/14/2016 FINAL  Final    RADIOLOGY:  Dg Chest Port 1 View  Result Date: 12/14/2016 CLINICAL DATA:  76 year old female with PICC line placement. EXAM: PORTABLE CHEST 1 VIEW COMPARISON:  Chest radiograph dated 12/13/2016 FINDINGS: There has been interval placement of a right-sided PICC with tip over the right atrium close to the cavoatrial junction. Small bilateral pleural effusions, right greater left with associated compressive atelectasis of the lung bases. Pneumonia is not excluded. There is no pneumothorax. Stable cardiac silhouette. Postsurgical changes of CABG. There is atherosclerotic calcification of the aorta. No acute osseous pathology. IMPRESSION: 1. Interval placement of a right-sided PICC with tip over the right atrium close to the cavoatrial junction. No pneumothorax. 2. Cardiomegaly with small bilateral pleural effusions and  associated partial compressive atelectasis versus infiltrate of the lung bases. 3. Atherosclerotic calcification of the aorta. Electronically Signed   By: Anner Crete M.D.   On: 12/14/2016 22:51    Follow up with PCP in 1 week.  Management plans discussed with the patient, family and they are in agreement.  CODE STATUS:     Code Status Orders        Start     Ordered   12/07/16 2103  Full code  Continuous     12/07/16 2103    Code Status History    Date Active Date Inactive Code Status Order ID Comments User Context   11/22/2016  1:20 PM 12/02/2016 10:17 PM DNR [id number]  Zada Finders, MD ED  11/19/2013  3:15 PM 11/23/2013  8:52 PM Full Code [id number]  Timmothy Euler, MD Inpatient   01/27/2012 10:48 PM 02/01/2012  5:42 PM Partial Code [id number]  Valda Lamb, RN Inpatient    Advance Directive Documentation     Most Recent Value  Type of Advance Directive  Healthcare Power of Attorney, Living will  Pre-existing out of facility DNR order (yellow form or pink MOST form)  -  "MOST" Form in Place?  -      TOTAL TIME TAKING CARE OF THIS PATIENT ON DAY OF DISCHARGE: more than 30 minutes.   Hillary Bow R M.D on 12/16/2016 at 3:49 PM  Between 7am to 6pm - Pager - [phone]  After 6pm go to [email or web address] - password [email or web address]  [facility]  Office  [phone]  CC: Primary care physician; Donetta Potts, MD  Note: This dictation was prepared with Dragon dictation along with smaller phrase technology. Any transcriptional errors that result from this process are unintentional.

## 2016-12-16 NOTE — Clinical Social Work Note (Signed)
CSW presented bed offers to patient's family and they chose [facility].  CSW contacted [facility] who can accept patient today.  Patient to be d/c'ed today to [facility].  Patient and family agreeable to plans will transport via ems RN to call report [place] room 807.  Evette Cristal, MSW, [place]

## 2016-12-16 NOTE — Progress Notes (Signed)
Anticoagulation monitoring(Lovenox):  76 yo  female ordered Lovenox 55 mg Q12h  Filed Weights   12/14/16 1416 12/15/16 0445 12/16/16 0451  Weight: 121 lb (54.9 kg) 121 lb (54.9 kg) 122 lb 9.6 oz (55.6 kg)   BMI    Lab Results  Component Value Date   CREATININE 1.68 (H) 12/15/2016   CREATININE 1.51 (H) 12/14/2016   CREATININE 1.28 (H) 12/12/2016   Estimated Creatinine Clearance: 23.2 mL/min (A) (by C-G formula based on SCr of 1.68 mg/dL (H)). Hemoglobin & Hematocrit     Component Value Date/Time   HGB 9.4 (L) 12/15/2016 0528   HGB 10.5 (L) 08/14/2014 0449   HCT 28.2 (L) 12/15/2016 0528   HCT 32.6 (L) 08/14/2014 0449     Per Protocol for Patient with estCrcl < 30 ml/min and BMI < 40, will transition to Lovenox 55 mg Q24h.

## 2016-12-16 NOTE — Clinical Social Work Placement (Addendum)
   CLINICAL SOCIAL WORK PLACEMENT  NOTE  Date:  12/16/2016  Patient Details  Name: KIFFANY SCHELLING MRN: [id number] Date of Birth: 1940/11/09  Clinical Social Work is seeking post-discharge placement for this patient at the [place] level of care (*CSW will initial, date and re-position this form in  chart as items are completed):  Yes   Patient/family provided with [place] Work Department's list of facilities offering this level of care within the geographic area requested by the patient (or if unable, by the patient's family).  Yes   Patient/family informed of their freedom to choose among providers that offer the needed level of care, that participate in Medicare, Medicaid or managed care program needed by the patient, have an available bed and are willing to accept the patient.  Yes   Patient/family informed of [facility]'s ownership interest in [facility] and [facility], as well as of the fact that they are under no obligation to receive care at these facilities.  PASRR submitted to EDS on 12/16/16     PASRR number received on      Existing PASRR number confirmed on 12/16/16     FL2 transmitted to all facilities in geographic area requested by pt/family on 12/16/16     FL2 transmitted to all facilities within larger geographic area on       Patient informed that his/her managed care company has contracts with or will negotiate with certain facilities, including the following:          12-16-16  Patient/family informed of bed offers received.  Patient chooses bed at   [facility]    Physician recommends and patient chooses bed at      Patient to be transferred to  [facility] on  12-16-16.  Patient to be transferred to facility by   [facility]    Patient family notified on  12-16-16 of transfer.  Name of family member notified:   Mauro Kaufmann patient's daughter.      PHYSICIAN Please sign FL2     Additional Comment:    _______________________________________________ Ross Ludwig, LCSWA 12/16/2016, 12:56 PM

## 2016-12-16 NOTE — Clinical Social Work Note (Signed)
Clinical Social Work Assessment  Patient Details  Name: Elizabeth [name] MRN: [id number] Date of Birth: 06/25/1941  Date of referral:  12/16/16               Reason for consult:  Facility Placement                Permission sought to share information with:  Family Supports, Customer service manager Permission granted to share information::  Yes, Verbal Permission Granted  Name::     Hoiland,[name] [phone]  [phone] or Russell,Heather Daughter [phone]  [phone]   Agency::  SNF admissions  Relationship::     Contact Information:     Housing/Transportation Living arrangements for the past 2 months:  Single Family Home Source of Information:  Spouse, Patient Patient Interpreter Needed:  None Criminal Activity/Legal Involvement Pertinent to Current Situation/Hospitalization:  No - Comment as needed Significant Relationships:  Adult Children, Spouse Lives with:  Spouse Do you feel safe going back to the place where you live?  No Need for family participation in patient care:  Yes (Comment)  Care giving concerns:  Patient and family feel she needs some short term rehab before she is able to return back home.   Social Worker assessment / plan:  Patient is a 76 year old married female who is alert and oriented x4 and able to express her feelings.  Patient lives with her husband and has been to rehab in the past.  Patient's husband was at bedside, and due to lethargy patient's husband spoke for patient.  Patient and her husband were explained how insurance will pay for the stay and what to expect at SNF for rehab.  Patient's husband expressed he wants patient to be able to at least pivot for transfers if possible so he can help take car of her at home.  Patient and husband did not express any other questions or concerns.  CSW was given permission to begin bed search in [facility], patient and husband prefer either [facility] or [facility].  CSW explained  to patient and her husband if the top two choices are not available they will have to consider other facilities, patient and family expressed understanding.    Employment status:  Retired Forensic scientist:  Medicare PT Recommendations:  [place] / Referral to community resources:  [place]  Patient/Family's Response to care:  Patient and family agreeable to going to SNF for short term rehab.  Patient/Family's Understanding of and Emotional Response to Diagnosis, Current Treatment, and Prognosis:  Patient and family expressed they are hopeful that patient will not have to be in rehab very long.  Emotional Assessment Appearance:  Appears stated age Attitude/Demeanor/Rapport:    Affect (typically observed):  Calm, Appropriate, Pleasant Orientation:  Oriented to Self, Oriented to Place, Oriented to  Time, Oriented to Situation Alcohol / Substance use:  Not Applicable Psych involvement (Current and /or in the community):  No (Comment)  Discharge Needs  Concerns to be addressed:  Lack of Support Readmission within the last 30 days:  No Current discharge risk:  None Barriers to Discharge:  Continued Medical Work up   Anell Barr 12/16/2016, 12:48 PM

## 2016-12-16 NOTE — Progress Notes (Signed)
MD notified. Metoprolol held this a.m. Secondary to HR <60. MD instructs RN to place parameters on metoprolol dose. I will continue to assess.

## 2016-12-16 NOTE — NC FL2 (Signed)
[place] LEVEL OF CARE SCREENING TOOL     IDENTIFICATION  Patient Name: Elizabeth [name] Birthdate: May 17, 1941 Sex: female Admission Date (Current Location): 12/07/2016  [place] and [place] Number:  Engineering geologist and Address:  [facility], [address] [place], [place] [address]      Provider Number: [id number]  Attending Physician Name and Address:  Hillary Bow, MD  Relative Name and Phone Number:  Canlas,[name] [phone]  [phone] or Russell,Heather Daughter [phone]  [phone]     Current Level of Care: Hospital Recommended Level of Care: [place] Prior Approval Number:    Date Approved/Denied:   PASRR Number: [id number]  Discharge Plan: SNF    Current Diagnoses: Patient Active Problem List   Diagnosis Date Noted  . Atrial fibrillation with rapid ventricular response ([place])   . AKI (acute kidney injury) ([place]) 12/07/2016  . Kidney transplant recipient   . Herpes simplex   . Failure to thrive (child)   . Hx of BKA, left ([place])   . Acute on chronic respiratory failure ([place]) 11/30/2016  . Hypoxia   . CHF (congestive heart failure) ([place])   . S/P thoracentesis   . Influenza with pneumonia   . CKD (chronic kidney disease) stage 4, GFR 15-29 ml/min (HCC)   . Chronic anticoagulation   . Community acquired pneumonia of right lower lobe of lung ([place])   . Febrile illness 11/22/2016  . Pleural effusion, right 09/30/2016  . Hyperlipidemia 06/18/2014  . Altered mental status 11/19/2013  . Acute encephalopathy 11/19/2013  . Chronic diastolic heart failure ([place]) 10/21/2013  . History of renal transplant 10/21/2013  . Encounter for therapeutic drug monitoring 09/17/2013  . UTI (lower urinary tract infection) 09/27/2012  . Concussion, unspecified 09/21/2012  . 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 Orthostatic hypotension 06/06/2012  . 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 Constipation 10/11/2011  . Nocturnal polyuria 03/31/2011  . Paroxysmal atrial fibrillation ([place]) 01/28/2011  . Encounter for anticoagulation discussion and counseling 01/28/2011  . CVA (cerebral vascular accident) ([place]) 01/28/2011  . DVT of upper extremity (deep vein thrombosis) ([place]) 01/28/2011  . Edema 01/10/2011  . SINUS BRADYCARDIA 12/14/2009  . GERD 12/08/2009  . DIZZINESS 05/20/2009  . Type 2 diabetes mellitus with stage 4 chronic kidney disease, without long-term current use of insulin ([place]) 02/11/2009  . HTN (hypertension), benign 02/09/2009  . Coronary artery disease involving native coronary artery of native heart without angina pectoris 02/09/2009  . CAROTID BRUITS, BILATERAL 02/09/2009  . 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 Irritable bowel syndrome 08/18/2008  . COLONIC POLYPS, HX OF 08/18/2008    Orientation RESPIRATION BLADDER Height & Weight     Self, Time, Situation, Place  O2 (1L) Continent Weight: 122 lb 9.6 oz (55.6 kg) Height:  5\' 3"  (160 cm)  BEHAVIORAL SYMPTOMS/MOOD NEUROLOGICAL BOWEL NUTRITION STATUS      Continent Diet (Dysphagia 1 diet)  AMBULATORY STATUS COMMUNICATION OF NEEDS Skin   Limited Assist Verbally Surgical wounds                       Personal Care Assistance Level of Assistance  Bathing, Feeding, Dressing Bathing Assistance: Limited assistance Feeding assistance: Independent Dressing Assistance: Limited assistance     Functional Limitations Info  Sight, Hearing, Speech Sight Info: Adequate Hearing Info: Adequate Speech Info: Adequate    SPECIAL CARE FACTORS FREQUENCY  PT (By licensed PT)  PT Frequency: 5x a week              Contractures Contractures Info: Not present    Additional Factors Info  Code Status, Allergies, Psychotropic, Insulin Sliding Scale Code Status Info: Full code Allergies Info: AZITHROMYCIN, ERYTHROMYCIN, FENTANYL,  PERCOCET OXYCODONE-ACETAMINOPHEN  Psychotropic Info: escitalopram (LEXAPRO) tablet 10 mg and mirtazapine (REMERON) tablet 7.5 mg Insulin Sliding Scale Info: insulin aspart (novoLOG) injection 0-9 Units 3x a day with meals       Current Medications (12/16/2016):  This is the current hospital active medication list Current Facility-Administered Medications  Medication Dose Route Frequency Provider Last Rate Last Dose  . acetaminophen (TYLENOL) tablet 650 mg  650 mg Oral Q6H PRN Hillary Bow, MD   650 mg at 12/12/16 1202   Or  . acetaminophen (TYLENOL) suppository 650 mg  650 mg Rectal Q6H PRN Srikar Sudini, MD      . albuterol (PROVENTIL) (2.5 MG/3ML) 0.083% nebulizer solution 2.5 mg  2.5 mg Nebulization Q2H PRN Srikar Sudini, MD      . atorvastatin (LIPITOR) tablet 10 mg  10 mg Oral QODAY Srikar Sudini, MD   10 mg at 12/16/16 0919  . bacitracin ointment   Topical BID Sital Mody, MD      . dextrose 5 %-0.9 % sodium chloride infusion   Intravenous Continuous Hillary Bow, MD 50 mL/hr at 12/16/16 0758    . escitalopram (LEXAPRO) tablet 10 mg  10 mg Oral Daily Hillary Bow, MD   10 mg at 12/16/16 0920  . feeding supplement (GLUCERNA SHAKE) (GLUCERNA SHAKE) liquid 237 mL  237 mL Oral TID BM Sital Mody, MD   237 mL at 12/16/16 0920  . fluconazole (DIFLUCAN) tablet 100 mg  100 mg Oral Daily Srikar Sudini, MD   100 mg at 12/16/16 0919  . heparin ADULT infusion 100 units/mL (25000 units/28mL sodium chloride 0.45%)  650 Units/hr Intravenous Continuous Srikar Sudini, MD 6.5 mL/hr at 12/15/16 0824 650 Units/hr at 12/15/16 0824  . HYDROcodone-acetaminophen (NORCO/VICODIN) 5-325 MG per tablet 2 tablet  2 tablet Oral BID PRN Hillary Bow, MD   2 tablet at 12/16/16 0919  . hyoscyamine (ANASPAZ) disintergrating tablet 0.125 mg  0.125 mg Sublingual PRN Srikar Sudini, MD      . insulin aspart (novoLOG) injection 0-9 Units  0-9 Units Subcutaneous TID AC & HS Lance Coon, MD   2 Units at 12/15/16 2205  .  levothyroxine (SYNTHROID, LEVOTHROID) tablet 112 mcg  112 mcg Oral QAC breakfast Hillary Bow, MD   112 mcg at 12/16/16 0919  . MEDLINE mouth rinse  15 mL Mouth Rinse BID Bettey Costa, MD   15 mL at 12/15/16 0959  . metoprolol tartrate (LOPRESSOR) tablet 12.5 mg  12.5 mg Oral BID Hillary Bow, MD   12.5 mg at 12/15/16 2206  . mirtazapine (REMERON) tablet 7.5 mg  7.5 mg Oral QHS Srikar Sudini, MD   7.5 mg at 12/15/16 2206  . mycophenolate (MYFORTIC) EC tablet 360 mg  360 mg Oral BID Hillary Bow, MD   360 mg at 12/16/16 0920  . ondansetron (ZOFRAN) tablet 4 mg  4 mg Oral Q6H PRN Hillary Bow, MD       Or  . ondansetron (ZOFRAN) injection 4 mg  4 mg Intravenous Q6H PRN Hillary Bow, MD   4 mg at 12/15/16 1330  . pantoprazole (PROTONIX) EC tablet 40 mg  40 mg Oral BID AC Srikar Sudini, MD   40 mg at 12/16/16 0919  . polyethylene  glycol (MIRALAX / GLYCOLAX) packet 17 g  17 g Oral Daily PRN Srikar Sudini, MD      . predniSONE (DELTASONE) tablet 10 mg  10 mg Oral Q breakfast Munsoor Lateef, MD   10 mg at 12/16/16 0919  . sodium chloride flush (NS) 0.9 % injection 10-40 mL  10-40 mL Intracatheter Q12H Harmeet Singh, MD   10 mL at 12/15/16 1112  . sodium chloride flush (NS) 0.9 % injection 10-40 mL  10-40 mL Intracatheter PRN Harmeet Singh, MD      . sodium chloride flush (NS) 0.9 % injection 3 mL  3 mL Intravenous Q12H Hillary Bow, MD   3 mL at 12/16/16 0923  . tacrolimus (PROGRAF) capsule 1 mg  1 mg Oral BID Murlean Iba, MD   1 mg at 12/16/16 0933  . warfarin (COUMADIN) tablet 2.5 mg  2.5 mg Oral q1800 Hillary Bow, MD   2.5 mg at 12/15/16 1746  . Warfarin - Pharmacist Dosing Inpatient   Does not apply q1800 Hillary Bow, MD         Discharge Medications: Please see discharge summary for a list of discharge medications.  Relevant Imaging Results:  Relevant Lab Results:   Additional Information SSN [id number]  Ross Ludwig, [place]

## 2016-12-21 ENCOUNTER — Ambulatory Visit: Payer: Medicare Other | Admitting: Family

## 2016-12-22 ENCOUNTER — Telehealth: Payer: Self-pay | Admitting: Gastroenterology

## 2016-12-22 NOTE — Telephone Encounter (Signed)
Mr. Elizabeth [name] left a voice message that patient can't come in. She is at [facility] at this time. He would like to come in and meet with Dr. Vicente Males before that scheduled appt. Is this possible?

## 2016-12-23 NOTE — Telephone Encounter (Signed)
Spoke to husband concerning patients condition and upcoming follow-up appointment.   Husband states patient is unable to tolerated pureed or chopped food. She's currently at a skilled nursing facility and per spouse her health is deteriorating.   Spouse was concerned about bringing patient to follow-up appointment. States he does the talking anyway and she may get sick on the way. He'd like to avoid that.   Advised patient that I would have Dr. Vicente Males call him on Monday prior to 3pm to discuss her health and next treatment steps.

## 2016-12-26 ENCOUNTER — Other Ambulatory Visit: Payer: Self-pay

## 2016-12-26 ENCOUNTER — Other Ambulatory Visit
Admission: RE | Admit: 2016-12-26 | Discharge: 2016-12-26 | Disposition: A | Discharge status: Home / Self Care | Payer: Medicare Other | Source: Skilled Nursing Facility | Attending: Family Medicine | Admitting: Family Medicine

## 2016-12-26 DIAGNOSIS — R1310 Dysphagia, unspecified: Secondary | ICD-10-CM

## 2016-12-26 DIAGNOSIS — I4891 Unspecified atrial fibrillation: Secondary | ICD-10-CM | POA: Diagnosis present

## 2016-12-26 DIAGNOSIS — R1319 Other dysphagia: Secondary | ICD-10-CM

## 2016-12-26 LAB — COMPREHENSIVE METABOLIC PANEL
ALBUMIN: 2.1 g/dL — AB (ref 3.5–5.0)
ALK PHOS: 66 U/L (ref 38–126)
ALT: 17 U/L (ref 14–54)
ANION GAP: 11 (ref 5–15)
AST: 16 U/L (ref 15–41)
BILIRUBIN TOTAL: 1 mg/dL (ref 0.3–1.2)
BUN: 69 mg/dL — AB (ref 6–20)
CALCIUM: 8 mg/dL — AB (ref 8.9–10.3)
CO2: 21 mmol/L — ABNORMAL LOW (ref 22–32)
Chloride: 106 mmol/L (ref 101–111)
Creatinine, Ser: 2.36 mg/dL — ABNORMAL HIGH (ref 0.44–1.00)
GFR calc Af Amer: 22 mL/min — ABNORMAL LOW (ref >60)
GFR calc non Af Amer: 19 mL/min — ABNORMAL LOW (ref >60)
GLUCOSE: 301 mg/dL — AB (ref 65–99)
Potassium: 4.9 mmol/L (ref 3.5–5.1)
Sodium: 138 mmol/L (ref 135–145)
TOTAL PROTEIN: 4 g/dL — AB (ref 6.5–8.1)

## 2016-12-26 LAB — CBC WITH DIFFERENTIAL/PLATELET
BASOS PCT: 0 %
Basophils Absolute: 0 10*3/uL (ref 0–0.1)
Eosinophils Absolute: 0 10*3/uL (ref 0–0.7)
Eosinophils Relative: 0 %
HEMATOCRIT: 31 % — AB (ref 35.0–47.0)
HEMOGLOBIN: 10 g/dL — AB (ref 12.0–16.0)
LYMPHS PCT: 8 %
Lymphs Abs: 0.3 10*3/uL — ABNORMAL LOW (ref 1.0–3.6)
MCH: 30.5 pg (ref 26.0–34.0)
MCHC: 32.3 g/dL (ref 32.0–36.0)
MCV: 94.3 fL (ref 80.0–100.0)
MONOS PCT: 5 %
Monocytes Absolute: 0.2 10*3/uL (ref 0.2–0.9)
NEUTROS ABS: 3 10*3/uL (ref 1.4–6.5)
NEUTROS PCT: 87 %
Platelets: 167 10*3/uL (ref 150–440)
RBC: 3.29 MIL/uL — ABNORMAL LOW (ref 3.80–5.20)
RDW: 19.5 % — ABNORMAL HIGH (ref 11.5–14.5)
WBC: 3.5 10*3/uL — ABNORMAL LOW (ref 3.6–11.0)

## 2016-12-27 ENCOUNTER — Ambulatory Visit: Payer: Medicare Other | Admitting: Gastroenterology

## 2016-12-27 ENCOUNTER — Ambulatory Visit: Payer: Medicare Other | Admitting: Internal Medicine

## 2016-12-27 LAB — PROTIME-INR
INR: 2.64
PROTHROMBIN TIME: 28.7 s — AB (ref 11.4–15.2)

## 2016-12-28 ENCOUNTER — Telehealth: Payer: Self-pay | Admitting: Gastroenterology

## 2016-12-28 ENCOUNTER — Encounter: Payer: Self-pay | Admitting: *Deleted

## 2016-12-28 NOTE — Telephone Encounter (Signed)
LVM for Kim at [facility] to return my call.

## 2016-12-28 NOTE — Telephone Encounter (Signed)
Please call Kim at [facility] at [phone]. Elizabeth [name] is having a procedure tomorrow 12/29/16 and she has some medication questions to ask. Please call ASAP.

## 2016-12-29 ENCOUNTER — Encounter
Admission: RE | Disposition: A | Discharge status: Home / Self Care | Payer: Self-pay | Source: Ambulatory Visit | Attending: Gastroenterology

## 2016-12-29 ENCOUNTER — Ambulatory Visit
Admission: RE | Admit: 2016-12-29 | Discharge: 2016-12-29 | Disposition: A | Discharge status: Home / Self Care | Payer: Medicare Other | Source: Ambulatory Visit | Attending: Gastroenterology | Admitting: Gastroenterology

## 2016-12-29 ENCOUNTER — Encounter: Payer: Self-pay | Admitting: *Deleted

## 2016-12-29 DIAGNOSIS — K220 Achalasia of cardia: Secondary | ICD-10-CM | POA: Diagnosis not present

## 2016-12-29 DIAGNOSIS — R1310 Dysphagia, unspecified: Secondary | ICD-10-CM | POA: Diagnosis not present

## 2016-12-29 HISTORY — PX: ESOPHAGEAL MANOMETRY: SHX5429

## 2016-12-29 SURGERY — MANOMETRY, ESOPHAGUS
Anesthesia: General

## 2016-12-29 MED ORDER — LIDOCAINE HCL 2 % EX GEL
CUTANEOUS | Status: AC
  Administered 2016-12-29: 14:00:00
  Filled 2016-12-29: qty 5

## 2016-12-29 MED ORDER — BUTAMBEN-TETRACAINE-BENZOCAINE 2-2-14 % EX AERO
INHALATION_SPRAY | CUTANEOUS | Status: AC
  Administered 2016-12-29: 14:00:00
  Filled 2016-12-29: qty 20

## 2016-12-29 SURGICAL SUPPLY — 2 items
FACESHIELD LNG OPTICON STERILE (SAFETY) IMPLANT
GLOVE BIO SURGEON STRL SZ8 (GLOVE) ×6 IMPLANT

## 2016-12-30 ENCOUNTER — Encounter: Payer: Self-pay | Admitting: Gastroenterology

## 2017-01-03 ENCOUNTER — Telehealth: Payer: Self-pay | Admitting: Gastroenterology

## 2017-01-03 NOTE — Telephone Encounter (Signed)
Patient's husband called wanting results from her procedure.

## 2017-01-04 ENCOUNTER — Emergency Department: Payer: Medicare Other

## 2017-01-04 ENCOUNTER — Inpatient Hospital Stay
Admission: EM | Admit: 2017-01-04 | Discharge: 2017-01-10 | DRG: 871 | Disposition: A | Discharge status: Hospice | Payer: Medicare Other | Attending: Specialist | Admitting: Specialist

## 2017-01-04 DIAGNOSIS — G9341 Metabolic encephalopathy: Secondary | ICD-10-CM | POA: Diagnosis present

## 2017-01-04 DIAGNOSIS — I482 Chronic atrial fibrillation: Secondary | ICD-10-CM | POA: Diagnosis present

## 2017-01-04 DIAGNOSIS — R609 Edema, unspecified: Secondary | ICD-10-CM

## 2017-01-04 DIAGNOSIS — K219 Gastro-esophageal reflux disease without esophagitis: Secondary | ICD-10-CM | POA: Diagnosis present

## 2017-01-04 DIAGNOSIS — Z794 Long term (current) use of insulin: Secondary | ICD-10-CM

## 2017-01-04 DIAGNOSIS — Z885 Allergy status to narcotic agent status: Secondary | ICD-10-CM

## 2017-01-04 DIAGNOSIS — Z86711 Personal history of pulmonary embolism: Secondary | ICD-10-CM

## 2017-01-04 DIAGNOSIS — Z87820 Personal history of traumatic brain injury: Secondary | ICD-10-CM

## 2017-01-04 DIAGNOSIS — Z940 Kidney transplant status: Secondary | ICD-10-CM | POA: Diagnosis not present

## 2017-01-04 DIAGNOSIS — Z7952 Long term (current) use of systemic steroids: Secondary | ICD-10-CM

## 2017-01-04 DIAGNOSIS — I5033 Acute on chronic diastolic (congestive) heart failure: Secondary | ICD-10-CM | POA: Diagnosis present

## 2017-01-04 DIAGNOSIS — Z951 Presence of aortocoronary bypass graft: Secondary | ICD-10-CM

## 2017-01-04 DIAGNOSIS — N390 Urinary tract infection, site not specified: Secondary | ICD-10-CM | POA: Diagnosis present

## 2017-01-04 DIAGNOSIS — Z993 Dependence on wheelchair: Secondary | ICD-10-CM

## 2017-01-04 DIAGNOSIS — Z79899 Other long term (current) drug therapy: Secondary | ICD-10-CM

## 2017-01-04 DIAGNOSIS — E43 Unspecified severe protein-calorie malnutrition: Secondary | ICD-10-CM | POA: Diagnosis present

## 2017-01-04 DIAGNOSIS — N250 Renal osteodystrophy: Secondary | ICD-10-CM | POA: Diagnosis present

## 2017-01-04 DIAGNOSIS — E1151 Type 2 diabetes mellitus with diabetic peripheral angiopathy without gangrene: Secondary | ICD-10-CM | POA: Diagnosis present

## 2017-01-04 DIAGNOSIS — I739 Peripheral vascular disease, unspecified: Secondary | ICD-10-CM | POA: Diagnosis present

## 2017-01-04 DIAGNOSIS — Z7901 Long term (current) use of anticoagulants: Secondary | ICD-10-CM

## 2017-01-04 DIAGNOSIS — E1122 Type 2 diabetes mellitus with diabetic chronic kidney disease: Secondary | ICD-10-CM | POA: Diagnosis present

## 2017-01-04 DIAGNOSIS — E1121 Type 2 diabetes mellitus with diabetic nephropathy: Secondary | ICD-10-CM | POA: Diagnosis present

## 2017-01-04 DIAGNOSIS — A419 Sepsis, unspecified organism: Principal | ICD-10-CM | POA: Diagnosis present

## 2017-01-04 DIAGNOSIS — I959 Hypotension, unspecified: Secondary | ICD-10-CM | POA: Diagnosis present

## 2017-01-04 DIAGNOSIS — Z66 Do not resuscitate: Secondary | ICD-10-CM | POA: Diagnosis present

## 2017-01-04 DIAGNOSIS — I052 Rheumatic mitral stenosis with insufficiency: Secondary | ICD-10-CM | POA: Diagnosis present

## 2017-01-04 DIAGNOSIS — E875 Hyperkalemia: Secondary | ICD-10-CM | POA: Diagnosis not present

## 2017-01-04 DIAGNOSIS — I708 Atherosclerosis of other arteries: Secondary | ICD-10-CM | POA: Diagnosis present

## 2017-01-04 DIAGNOSIS — T829XXA Unspecified complication of cardiac and vascular prosthetic device, implant and graft, initial encounter: Secondary | ICD-10-CM | POA: Diagnosis present

## 2017-01-04 DIAGNOSIS — K220 Achalasia of cardia: Secondary | ICD-10-CM | POA: Diagnosis present

## 2017-01-04 DIAGNOSIS — J189 Pneumonia, unspecified organism: Secondary | ICD-10-CM

## 2017-01-04 DIAGNOSIS — Z515 Encounter for palliative care: Secondary | ICD-10-CM | POA: Diagnosis present

## 2017-01-04 DIAGNOSIS — E11319 Type 2 diabetes mellitus with unspecified diabetic retinopathy without macular edema: Secondary | ICD-10-CM | POA: Diagnosis present

## 2017-01-04 DIAGNOSIS — Z89512 Acquired absence of left leg below knee: Secondary | ICD-10-CM

## 2017-01-04 DIAGNOSIS — N184 Chronic kidney disease, stage 4 (severe): Secondary | ICD-10-CM | POA: Diagnosis present

## 2017-01-04 DIAGNOSIS — Z7189 Other specified counseling: Secondary | ICD-10-CM | POA: Diagnosis not present

## 2017-01-04 DIAGNOSIS — E785 Hyperlipidemia, unspecified: Secondary | ICD-10-CM | POA: Diagnosis present

## 2017-01-04 DIAGNOSIS — Y828 Other medical devices associated with adverse incidents: Secondary | ICD-10-CM | POA: Diagnosis present

## 2017-01-04 DIAGNOSIS — I130 Hypertensive heart and chronic kidney disease with heart failure and stage 1 through stage 4 chronic kidney disease, or unspecified chronic kidney disease: Secondary | ICD-10-CM | POA: Diagnosis present

## 2017-01-04 DIAGNOSIS — E039 Hypothyroidism, unspecified: Secondary | ICD-10-CM | POA: Diagnosis present

## 2017-01-04 DIAGNOSIS — M810 Age-related osteoporosis without current pathological fracture: Secondary | ICD-10-CM | POA: Diagnosis present

## 2017-01-04 DIAGNOSIS — Y95 Nosocomial condition: Secondary | ICD-10-CM | POA: Diagnosis present

## 2017-01-04 DIAGNOSIS — D72819 Decreased white blood cell count, unspecified: Secondary | ICD-10-CM | POA: Diagnosis present

## 2017-01-04 DIAGNOSIS — I701 Atherosclerosis of renal artery: Secondary | ICD-10-CM | POA: Diagnosis present

## 2017-01-04 DIAGNOSIS — E739 Lactose intolerance, unspecified: Secondary | ICD-10-CM | POA: Diagnosis present

## 2017-01-04 DIAGNOSIS — Z8673 Personal history of transient ischemic attack (TIA), and cerebral infarction without residual deficits: Secondary | ICD-10-CM

## 2017-01-04 DIAGNOSIS — Z881 Allergy status to other antibiotic agents status: Secondary | ICD-10-CM

## 2017-01-04 DIAGNOSIS — K589 Irritable bowel syndrome without diarrhea: Secondary | ICD-10-CM | POA: Diagnosis present

## 2017-01-04 DIAGNOSIS — I82621 Acute embolism and thrombosis of deep veins of right upper extremity: Secondary | ICD-10-CM | POA: Diagnosis present

## 2017-01-04 DIAGNOSIS — Z6820 Body mass index (BMI) 20.0-20.9, adult: Secondary | ICD-10-CM

## 2017-01-04 DIAGNOSIS — R0902 Hypoxemia: Secondary | ICD-10-CM

## 2017-01-04 DIAGNOSIS — I951 Orthostatic hypotension: Secondary | ICD-10-CM | POA: Diagnosis present

## 2017-01-04 DIAGNOSIS — I2510 Atherosclerotic heart disease of native coronary artery without angina pectoris: Secondary | ICD-10-CM | POA: Diagnosis present

## 2017-01-04 DIAGNOSIS — I6529 Occlusion and stenosis of unspecified carotid artery: Secondary | ICD-10-CM | POA: Diagnosis present

## 2017-01-04 LAB — PROTIME-INR
INR: 4.75
INR: 4.75
PROTHROMBIN TIME: 45.9 s — AB (ref 11.4–15.2)
Prothrombin Time: 45.9 seconds — ABNORMAL HIGH (ref 11.4–15.2)

## 2017-01-04 LAB — COMPREHENSIVE METABOLIC PANEL
ALK PHOS: 74 U/L (ref 38–126)
ALT: 11 U/L — AB (ref 14–54)
AST: 15 U/L (ref 15–41)
Albumin: 2 g/dL — ABNORMAL LOW (ref 3.5–5.0)
Anion gap: 4 — ABNORMAL LOW (ref 5–15)
BUN: 44 mg/dL — ABNORMAL HIGH (ref 6–20)
CALCIUM: 8.1 mg/dL — AB (ref 8.9–10.3)
CO2: 25 mmol/L (ref 22–32)
CREATININE: 1.23 mg/dL — AB (ref 0.44–1.00)
Chloride: 113 mmol/L — ABNORMAL HIGH (ref 101–111)
GFR calc non Af Amer: 41 mL/min — ABNORMAL LOW (ref >60)
GFR, EST AFRICAN AMERICAN: 48 mL/min — AB (ref >60)
GLUCOSE: 47 mg/dL — AB (ref 65–99)
Potassium: 4.7 mmol/L (ref 3.5–5.1)
SODIUM: 142 mmol/L (ref 135–145)
TOTAL PROTEIN: 4.2 g/dL — AB (ref 6.5–8.1)
Total Bilirubin: 0.7 mg/dL (ref 0.3–1.2)

## 2017-01-04 LAB — URINALYSIS, COMPLETE (UACMP) WITH MICROSCOPIC
Bilirubin Urine: NEGATIVE
Glucose, UA: NEGATIVE mg/dL
Hgb urine dipstick: NEGATIVE
KETONES UR: NEGATIVE mg/dL
Nitrite: NEGATIVE
PROTEIN: 30 mg/dL — AB
SQUAMOUS EPITHELIAL / LPF: NONE SEEN
Specific Gravity, Urine: 1.014 (ref 1.005–1.030)
pH: 7 (ref 5.0–8.0)

## 2017-01-04 LAB — CBC
HCT: 31.7 % — ABNORMAL LOW (ref 35.0–47.0)
HEMOGLOBIN: 10.3 g/dL — AB (ref 12.0–16.0)
MCH: 32 pg (ref 26.0–34.0)
MCHC: 32.5 g/dL (ref 32.0–36.0)
MCV: 98.4 fL (ref 80.0–100.0)
PLATELETS: 191 10*3/uL (ref 150–440)
RBC: 3.22 MIL/uL — ABNORMAL LOW (ref 3.80–5.20)
RDW: 20.4 % — AB (ref 11.5–14.5)
WBC: 3 10*3/uL — ABNORMAL LOW (ref 3.6–11.0)

## 2017-01-04 LAB — LIPASE, BLOOD: Lipase: 13 U/L (ref 11–51)

## 2017-01-04 LAB — LACTIC ACID, PLASMA: LACTIC ACID, VENOUS: 1 mmol/L (ref 0.5–1.9)

## 2017-01-04 LAB — MRSA PCR SCREENING: MRSA by PCR: NEGATIVE

## 2017-01-04 LAB — BRAIN NATRIURETIC PEPTIDE: B NATRIURETIC PEPTIDE 5: 615 pg/mL — AB (ref 0.0–100.0)

## 2017-01-04 LAB — TROPONIN I: Troponin I: 0.04 ng/mL (ref <0.03)

## 2017-01-04 MED ORDER — INSULIN ASPART 100 UNIT/ML ~~LOC~~ SOLN
0.0000 [IU] | Freq: Three times a day (TID) | SUBCUTANEOUS | Status: DC
  Administered 2017-01-05: 2 [IU] via SUBCUTANEOUS
  Administered 2017-01-06 (×2): 1 [IU] via SUBCUTANEOUS
  Filled 2017-01-04: qty 2
  Filled 2017-01-04 (×2): qty 1

## 2017-01-04 MED ORDER — VANCOMYCIN HCL IN DEXTROSE 1-5 GM/200ML-% IV SOLN
1000.0000 mg | Freq: Once | INTRAVENOUS | Status: AC
  Administered 2017-01-04: 1000 mg via INTRAVENOUS
  Filled 2017-01-04: qty 200

## 2017-01-04 MED ORDER — TACROLIMUS 1 MG PO CAPS
1.0000 mg | ORAL_CAPSULE | Freq: Every day | ORAL | Status: DC
  Administered 2017-01-05 – 2017-01-09 (×4): 1 mg via ORAL
  Filled 2017-01-04 (×7): qty 1

## 2017-01-04 MED ORDER — DOCUSATE SODIUM 100 MG PO CAPS: 100.0000 mg | ORAL_CAPSULE | Freq: Every day | ORAL | Status: DC | PRN

## 2017-01-04 MED ORDER — HYDROCODONE-ACETAMINOPHEN 5-325 MG PO TABS: 1.0000 | ORAL_TABLET | Freq: Four times a day (QID) | ORAL | Status: DC | PRN

## 2017-01-04 MED ORDER — PREDNISONE 5 MG PO TABS
5.0000 mg | ORAL_TABLET | Freq: Every morning | ORAL | Status: DC
  Administered 2017-01-05 – 2017-01-09 (×2): 5 mg via ORAL
  Filled 2017-01-04 (×4): qty 1

## 2017-01-04 MED ORDER — ONDANSETRON HCL 4 MG/2ML IJ SOLN
4.0000 mg | Freq: Four times a day (QID) | INTRAMUSCULAR | Status: DC | PRN
  Administered 2017-01-05 – 2017-01-06 (×2): 4 mg via INTRAVENOUS
  Filled 2017-01-04 (×3): qty 2

## 2017-01-04 MED ORDER — ADULT MULTIVITAMIN W/MINERALS CH
1.0000 | ORAL_TABLET | Freq: Every day | ORAL | Status: DC
  Administered 2017-01-05: 1 via ORAL
  Filled 2017-01-04 (×3): qty 1

## 2017-01-04 MED ORDER — LEVOTHYROXINE SODIUM 112 MCG PO TABS
112.0000 ug | ORAL_TABLET | Freq: Every day | ORAL | Status: DC
  Administered 2017-01-05: 112 ug via ORAL
  Filled 2017-01-04 (×5): qty 1

## 2017-01-04 MED ORDER — CEFTRIAXONE SODIUM IN DEXTROSE 20 MG/ML IV SOLN
INTRAVENOUS | Status: AC
  Filled 2017-01-04: qty 50

## 2017-01-04 MED ORDER — WARFARIN - PHARMACIST DOSING INPATIENT
Freq: Every day | Status: DC
  Administered 2017-01-06 – 2017-01-09 (×2)

## 2017-01-04 MED ORDER — MYCOPHENOLATE SODIUM 180 MG PO TBEC
360.0000 mg | DELAYED_RELEASE_TABLET | Freq: Two times a day (BID) | ORAL | Status: DC
  Administered 2017-01-05 – 2017-01-09 (×6): 360 mg via ORAL
  Filled 2017-01-04 (×8): qty 2

## 2017-01-04 MED ORDER — ACETAMINOPHEN 325 MG PO TABS: 650.0000 mg | ORAL_TABLET | Freq: Four times a day (QID) | ORAL | Status: DC | PRN

## 2017-01-04 MED ORDER — INSULIN ASPART 100 UNIT/ML ~~LOC~~ SOLN
0.0000 [IU] | Freq: Every day | SUBCUTANEOUS | Status: DC
  Administered 2017-01-07: 4 [IU] via SUBCUTANEOUS
  Filled 2017-01-04: qty 2
  Filled 2017-01-04: qty 4

## 2017-01-04 MED ORDER — ACETAMINOPHEN 650 MG RE SUPP: 650.0000 mg | Freq: Four times a day (QID) | RECTAL | Status: DC | PRN

## 2017-01-04 MED ORDER — TACROLIMUS 1 MG PO CAPS
2.0000 mg | ORAL_CAPSULE | Freq: Every morning | ORAL | Status: DC
  Administered 2017-01-05 – 2017-01-09 (×2): 2 mg via ORAL
  Filled 2017-01-04 (×6): qty 2

## 2017-01-04 MED ORDER — ESCITALOPRAM OXALATE 10 MG PO TABS
10.0000 mg | ORAL_TABLET | Freq: Every day | ORAL | Status: DC
  Administered 2017-01-05 – 2017-01-09 (×2): 10 mg via ORAL
  Filled 2017-01-04 (×4): qty 1

## 2017-01-04 MED ORDER — GLUCERNA SHAKE PO LIQD
237.0000 mL | Freq: Three times a day (TID) | ORAL | Status: DC
  Administered 2017-01-05: 237 mL via ORAL

## 2017-01-04 MED ORDER — ATORVASTATIN CALCIUM 20 MG PO TABS
10.0000 mg | ORAL_TABLET | ORAL | Status: DC
  Filled 2017-01-04: qty 1

## 2017-01-04 MED ORDER — POTASSIUM CHLORIDE CRYS ER 10 MEQ PO TBCR
10.0000 meq | EXTENDED_RELEASE_TABLET | Freq: Every day | ORAL | Status: DC
  Filled 2017-01-04: qty 1

## 2017-01-04 MED ORDER — VITAMIN D 1000 UNITS PO TABS
2000.0000 [IU] | ORAL_TABLET | Freq: Every day | ORAL | Status: DC
  Administered 2017-01-05: 2000 [IU] via ORAL
  Filled 2017-01-04 (×4): qty 2

## 2017-01-04 MED ORDER — CEFEPIME HCL 2 G IJ SOLR
2.0000 g | Freq: Once | INTRAMUSCULAR | Status: AC
  Administered 2017-01-04: 2 g via INTRAVENOUS
  Filled 2017-01-04: qty 2

## 2017-01-04 MED ORDER — DEXTROSE 5 % IV SOLN: 2.0000 g | Freq: Once | INTRAVENOUS | Status: DC

## 2017-01-04 MED ORDER — MIRTAZAPINE 15 MG PO TABS
7.5000 mg | ORAL_TABLET | Freq: Every day | ORAL | Status: DC
  Administered 2017-01-05 – 2017-01-09 (×4): 7.5 mg via ORAL
  Filled 2017-01-04 (×4): qty 1

## 2017-01-04 MED ORDER — PROMETHAZINE HCL 25 MG PO TABS: 25.0000 mg | ORAL_TABLET | Freq: Four times a day (QID) | ORAL | Status: DC | PRN

## 2017-01-04 MED ORDER — DEXTROSE 5 % IV SOLN
2.0000 g | INTRAVENOUS | Status: DC
  Administered 2017-01-05 – 2017-01-10 (×5): 2 g via INTRAVENOUS
  Filled 2017-01-04 (×8): qty 2

## 2017-01-04 MED ORDER — ONDANSETRON HCL 4 MG PO TABS: 4.0000 mg | ORAL_TABLET | Freq: Four times a day (QID) | ORAL | Status: DC | PRN

## 2017-01-04 MED ORDER — VANCOMYCIN HCL IN DEXTROSE 750-5 MG/150ML-% IV SOLN
750.0000 mg | INTRAVENOUS | Status: DC
  Administered 2017-01-05: 04:00:00 750 mg via INTRAVENOUS
  Filled 2017-01-04: qty 150

## 2017-01-04 MED ORDER — FERROUS SULFATE 325 (65 FE) MG PO TABS
325.0000 mg | ORAL_TABLET | Freq: Every day | ORAL | Status: DC
  Administered 2017-01-05: 325 mg via ORAL
  Filled 2017-01-04 (×3): qty 1

## 2017-01-04 NOTE — Progress Notes (Signed)
Pharmacy Antibiotic Note  Elizabeth [name] is a 76 y.o. female admitted on 01/04/2017 with  pneumonia. Patient had recent admission in April for PNA.   Pharmacy has been consulted for Vancomycin and cefepime dosing. Patient received vancomycin 1gm IV and cefepime 2g IV x 1 dose in ED.   Plan: Ke: 0.030  VD: 44   T1/2: 23.1  Will start patient on vancomycin 750mg  IV every 24 hours with 10 hour stack dosing. Calculated trough at C[facility] is 16.5. Trough ordered prior to 4th dose. Will monitor renal function daily and adjust dose as needed. MRSA PCR from 11/24/16 was negative- recommend discontinuation of vancomycin .   Will start patient on cefepime 2gm IV every 24 hours based on current renal function.   Height: 5\' 3"  (160 cm) Weight: 114 lb (51.7 kg) IBW/kg (Calculated) : 52.4  Temp (24hrs), Avg:97.6 F (36.4 C), Min:97.6 F (36.4 C), Max:97.6 F (36.4 C)   Recent Labs Lab 01/04/17 1339  WBC 3.0*  CREATININE 1.23*  LATICACIDVEN 1.0    Estimated Creatinine Clearance: 31.3 mL/min (A) (by C-G formula based on SCr of 1.23 mg/dL (H)).    Allergies  Allergen Reactions  . Azithromycin Other (See Comments)    Upset stomach  . Erythromycin Nausea And Vomiting  . Fentanyl Nausea And Vomiting  . Percocet [Oxycodone-Acetaminophen] Nausea And Vomiting    Antimicrobials this admission: 5/16 cefepime >>  5/16 vancomcyin >>   Dose adjustments this admission:  Microbiology results: 5/16 BCx: sent   4/5  MRSA PCR: negative   Thank you for allowing pharmacy to be a part of this patient's care.  Pernell Dupre, PharmD, BCPS Clinical Pharmacist 01/04/2017 7:17 PM

## 2017-01-04 NOTE — Telephone Encounter (Signed)
Do we have the manometry report?

## 2017-01-04 NOTE — H&P (Signed)
[facility] NAME: Elizabeth [name]    MR#:  [id number]  DATE OF BIRTH:  September 30, 1940  DATE OF ADMISSION:  01/04/2017  PRIMARY CARE PHYSICIAN: Donato Heinz, MD   REQUESTING/REFERRING PHYSICIAN: Dr. Lisa Roca  CHIEF COMPLAINT:   Chief Complaint  Patient presents with  . Abdominal Pain    HISTORY OF PRESENT ILLNESS:  Elizabeth [name]  is a 76 y.o. female with a known history of Atrial fibrillation, coronary artery disease status post bypass, diabetes, history of previous CVA, recent diagnosis was suspected achalasia, carotid artery disease, esophageal candidiasis, history of renal transplant, peripheral vascular disease status post left below the knee amputation who presents to the hospital due to tachypnea, weakness. Patient herself is a very poor historian given her lethargy and weakness therefore most of the history obtained from the husband and daughter at bedside. As per the husband patient had been doing well until the past few days she has been weak, having some increased respiratory rate and thought she had a urinary tract infection at the Nursing facility and was started on Bactrim but without much improvement and therefore sent to the ER for further evaluation. In the emergency room patient met sepsis criteria given her tachypnea, leukopenia and also chest x-ray findings suggestive of pneumonia and also abnormal urinalysis suggestive of urinary tract infection. Hospitalist services were contacted further treatment and evaluation.  PAST MEDICAL HISTORY:   Past Medical History:  Diagnosis Date  . Adenomatous polyp   . Anemia   . 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 Atrial fibrillation ([place])    a. on Coumadin. No rate-controlling medications with baseline bradycardia.   . Bradycardia   . CAD (coronary artery disease)    a. s/p  1-vessel CABG to LCx in 2001  . Carotid artery stenosis    mild  . Carotid bruit    left  . Chronic kidney disease    s/p cadaveric renal transplant in 2001  . Colitis   . Compression fracture of L4 lumbar vertebra (H[facility])   . Coronary artery disease   . CVA (cerebral infarction)   . Diabetes mellitus   . Diabetic retinopathy   . Esophageal candidiasis ([place])   . Flu 11/22/2016  . Fracture of right maxilla 09/21/2012  . Fx ankle   . Fx wrist   . Gastritis   . GERD (gastroesophageal reflux disease)   . H/O immunosuppressive therapy   . History of orthostatic hypotension   . Hyperlipidemia   . Hyperparathyroidism   . Hypothyroidism   . IBS (irritable bowel syndrome)   . Ischemic colitis ([place])   . Lactose intolerance   . Leg ulcer ([place])    from poor fitting prosthetic  . Melanoma ([place])   . Nephropathy   . 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 Neuropathy   . Osteoporosis    recurrent fractures  . Pancreatic cyst   . Pneumonia 11/2016  . Pulmonary embolism ([place])   . PVD (peripheral vascular disease) ([place])   . Renal artery stenosis ([place])   . 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 Renal osteodystrophy   . Right orbit fracture ([place]) 09/21/2012  . Stroke ([place])   . TBI (traumatic brain injury) ([place]) 09/30/2012  . Tubular adenoma   . Zygoma fracture ([place]) 09/21/2012    PAST SURGICAL HISTORY:   Past  Surgical History:  Procedure Laterality Date  . ABDOMINAL HYSTERECTOMY    . APPENDECTOMY    . BELOW KNEE LEG AMPUTATION     left, charcott joint from neuropathy  . CATARACT EXTRACTION    . CESAREAN SECTION     x 2  . CHOLECYSTECTOMY    . COLONOSCOPY  01/30/2012   Procedure: COLONOSCOPY;  Surgeon: Milus Banister, MD;  Location: [facility];  Service: Endoscopy;  Laterality: N/A;  . COLONOSCOPY W/ BIOPSIES AND POLYPECTOMY  02/18/2009   adenomatous polyps, diverticulosis, internal hemorrhoids  . CORONARY ARTERY BYPASS GRAFT    . ESOPHAGEAL MANOMETRY N/A 12/29/2016    Procedure: ESOPHAGEAL MANOMETRY (EM);  Surgeon: Jonathon Bellows, MD;  Location: [facility] ENDOSCOPY;  Service: Endoscopy;  Laterality: N/A;  . ESOPHAGOGASTRODUODENOSCOPY  05/02/2012   Procedure: ESOPHAGOGASTRODUODENOSCOPY (EGD);  Surgeon: Gatha Mayer, MD;  Location: [facility] ENDOSCOPY;  Service: Endoscopy;  Laterality: N/A;  . ESOPHAGOGASTRODUODENOSCOPY (EGD) WITH PROPOFOL N/A 12/14/2016   Procedure: ESOPHAGOGASTRODUODENOSCOPY (EGD) WITH PROPOFOL;  Surgeon: Jonathon Bellows, MD;  Location: [facility] ENDOSCOPY;  Service: Endoscopy;  Laterality: N/A;  . EUS  02/04/2010   w/FNA, pancreatic cyst  . EYE SURGERY    . IR THORACENTESIS ASP PLEURAL SPACE W/IMG GUIDE  11/24/2016  . KIDNEY TRANSPLANT  08/15/2000   [facility] [place]  . THORACENTESIS  07/2016, 08/2016, 09/2016   Ordered by Dr. Vella Kohler. Surg: Dr.Green, Dr. Anselm Pancoast  . THYROID SURGERY    . TONSILLECTOMY    . UPPER GASTROINTESTINAL ENDOSCOPY  12/01/2009  . VITRECTOMY     right eye    SOCIAL HISTORY:   Social History  Substance Use Topics  . Smoking status: Never Smoker  . Smokeless tobacco: Never Used  . Alcohol use No    FAMILY HISTORY:   Family History  Problem Relation Age of Onset  . Stroke Mother 6  . Heart attack Mother   . Heart attack Father 70  . Breast cancer Sister   . Colon cancer Neg Hx   . Diabetes Neg Hx     DRUG ALLERGIES:   Allergies  Allergen Reactions  . Azithromycin Other (See Comments)    Upset stomach  . Erythromycin Nausea And Vomiting  . Fentanyl Nausea And Vomiting  . Percocet [Oxycodone-Acetaminophen] Nausea And Vomiting    REVIEW OF SYSTEMS:   Review of Systems  Unable to perform ROS: Mental acuity    MEDICATIONS AT HOME:   Prior to Admission medications   Medication Sig Start Date End Date Taking? Authorizing Provider  acetaminophen (TYLENOL) 500 MG tablet Take 1,000 mg by mouth every 6 (six) hours as needed for moderate pain or headache.    Yes [provider]  atorvastatin (LIPITOR)  10 MG tablet Take 1 tablet (10 mg total) by mouth every other day. 10/12/12  Yes Angiulli, Lavon Paganini, PA-C  cholecalciferol (VITAMIN D) 1000 UNITS tablet Take 2,000 Units by mouth daily.    Yes [provider]  docusate sodium (COLACE) 100 MG capsule Take 100 mg by mouth daily as needed for moderate constipation.    Yes [provider]  escitalopram (LEXAPRO) 10 MG tablet Take 10 mg by mouth daily.   Yes [provider]  ferrous sulfate (SM IRON) 325 (65 FE) MG tablet Take 325 mg by mouth daily.    Yes [provider]  HYDROcodone-acetaminophen (NORCO/VICODIN) 5-325 MG tablet Take 1-2 tablets by mouth every 6 (six) hours as needed for severe pain. 12/16/16  Yes Hillary Bow, MD  hyoscyamine (ANASPAZ) 0.125 MG TBDP disintergrating tablet Place 0.125 mg under the tongue as needed for cramping.    Yes [provider]  insulin detemir (LEVEMIR) 100 UNIT/ML injection Inject 4 Units into the skin daily.   Yes [provider]  insulin lispro (HUMALOG) 100 UNIT/ML injection Inject 2-9 Units into the skin 4 (four) times daily -  before meals and at bedtime. Per sliding scale.   Yes [provider]  levothyroxine (SYNTHROID, LEVOTHROID) 112 MCG tablet Take 112 mcg by mouth daily before breakfast.   Yes [provider]  loperamide (IMODIUM) 2 MG capsule Take 1 capsule (2 mg total) by mouth every 6 (six) hours as needed for diarrhea or loose stools. 12/16/16  Yes Sudini, Alveta Heimlich, MD  magnesium gluconate (MAGONATE) 500 MG tablet Take 1,000 mg by mouth daily.    Yes [provider]  mirtazapine (REMERON) 7.5 MG tablet Take 7.5 mg by mouth at bedtime.  02/15/13  Yes [provider]  Multiple Vitamin (MULTIVITAMIN WITH MINERALS) TABS Take 1 tablet by mouth daily.   Yes [provider]  mycophenolate (MYFORTIC) 360 MG TBEC Take 360 mg by mouth 2 (two) times daily.    Yes [provider]  nitroGLYCERIN (NITROSTAT)  0.4 MG SL tablet Place 1 tablet (0.4 mg total) under the tongue every 5 (five) minutes as needed for chest pain. 11/02/12  Yes Gollan, Kathlene November, MD  ondansetron (ZOFRAN) 4 MG tablet Take 4 mg by mouth every 8 (eight) hours as needed for nausea or vomiting.    Yes [provider]  potassium chloride (K-DUR,KLOR-CON) 10 MEQ tablet Take 10 mEq by mouth daily.   Yes [provider]  predniSONE (DELTASONE) 5 MG tablet Take 5 mg by mouth every morning.    Yes [provider]  promethazine (PHENERGAN) 25 MG tablet Take 1 tablet (25 mg total) by mouth every 6 (six) hours as needed for nausea or vomiting. 11/02/16  Yes Gollan, Kathlene November, MD  tacrolimus (PROGRAF) 0.5 MG capsule Take 2 capsules (1 mg total) by mouth 2 (two) times daily. Take 17m in the am & 118mat night 12/16/16  Yes Sudini, SrArtistMD  torsemide (DEMADEX) 20 MG tablet TAKE ONE TABLET BY MOUTH TWICE DAILY AS NEEDED 05/23/16  Yes Gollan, TiKathlene NovemberMD  warfarin (COUMADIN) 3 MG tablet Take 1 tablet (3 mg total) by mouth daily at 6 PM. Patient taking differently: Take 2.5 mg by mouth daily at 6 PM.  12/16/16 01/04/17 Yes Sudini, Srikar, MD  enoxaparin (LOVENOX) 60 MG/0.6ML injection Inject 0.55 mLs (55 mg total) into the skin daily. Patient not taking: Reported on 01/04/2017 12/16/16 12/20/16  SuHillary BowMD  feeding supplement, GLUCERNA SHAKE, (GLUCERNA SHAKE) LIQD Take 237 mLs by mouth 3 (three) times daily between meals. 12/16/16   SuHillary BowMD  metoprolol tartrate (LOPRESSOR) 25 MG tablet Take 0.5 tablets (12.5 mg total) by mouth 2 (two) times daily. Patient not taking: Reported on 01/04/2017 12/16/16   SuHillary BowMD  omeprazole (PRILOSEC) 40 MG capsule Take 1 capsule (40 mg total) by mouth 2 (two) times daily before a meal. Patient not taking: Reported on 01/04/2017 12/16/16   SuHillary BowMD  [facility] test strip USE AS DIRECTED 3 TIMES A DAY 11/14/16   [provider]      VITAL SIGNS:  Blood  pressure (!) 136/46, pulse (!) 55, temperature 97.6 F (36.4 C), temperature source Oral, resp. rate (!) 26, height 5' 3"  (1.6  m), weight 51.7 kg (114 lb), SpO2 99 %.  PHYSICAL EXAMINATION:  Physical Exam  GENERAL:  76 y.o.-year-old patient lying in bed lethargic/encephalopathic.   EYES: Pupils equal, round, reactive to light and accommodation. No scleral icterus. Extraocular muscles intact.  HEENT: Head atraumatic, normocephalic. Oropharynx and nasopharynx clear. No oropharyngeal erythema, moist oral mucosa  NECK:  Supple, no jugular venous distention. No thyroid enlargement, no tenderness.  LUNGS: Normal breath sounds bilaterally, no wheezing, rales, rhonchi. No use of accessory muscles of respiration.  CARDIOVASCULAR: S1, S2 RRR. No murmurs, rubs, gallops, clicks.  ABDOMEN: Soft, nontender, nondistended. Bowel sounds present. No organomegaly or mass.  EXTREMITIES: No pedal edema, cyanosis, or clubbing. + 2 pedal & radial pulses b/l.   NEUROLOGIC: Cranial nerves II through XII are intact. No focal Motor or sensory deficits appreciated b/l.  PSYCHIATRIC: The patient is alert and oriented x 1.  Lethargic Encephalopathic.  SKIN: No obvious rash, lesion, or ulcer.   LABORATORY PANEL:   CBC  Recent Labs Lab 01/04/17 1339  WBC 3.0*  HGB 10.3*  HCT 31.7*  PLT 191   ------------------------------------------------------------------------------------------------------------------  Chemistries   Recent Labs Lab 01/04/17 1339  NA 142  K 4.7  CL 113*  CO2 25  GLUCOSE 47*  BUN 44*  CREATININE 1.23*  CALCIUM 8.1*  AST 15  ALT 11*  ALKPHOS 74  BILITOT 0.7   ------------------------------------------------------------------------------------------------------------------  Cardiac Enzymes  Recent Labs Lab 01/04/17 1339  TROPONINI 0.04*   ------------------------------------------------------------------------------------------------------------------  RADIOLOGY:  Ct  Head Wo Contrast  Result Date: 01/04/2017 CLINICAL DATA:  Initial evaluation for acute altered mental status. EXAM: CT HEAD WITHOUT CONTRAST TECHNIQUE: Contiguous axial images were obtained from the base of the skull through the vertex without intravenous contrast. COMPARISON:  Prior CT from 08/13/2014. FINDINGS: Brain: Generalized cerebral atrophy with mild chronic small vessel ischemic disease. Encephalomalacia related to remote right parietal and left cerebellar infarcts noted. Additional small remote right cerebellar infarcts noted as well. Overall, changes are similar to previous. No acute intracranial hemorrhage. No evidence for acute large vessel territory infarct. No mass lesion, midline shift or mass effect. Diffuse ventricular prominence related global parenchymal volume loss without hydrocephalus. Vascular: No hyperdense vessel. Scattered vascular calcifications noted within the carotid siphons and distal vertebral arteries. Skull: Scalp soft tissues within normal limits.  Calvarium intact. Sinuses/Orbits: Globes and orbital soft tissues demonstrate no acute abnormality. Visualized paranasal sinuses are clear. Right mastoid effusion with partial opacification of the right middle air cavity. Trace left mastoid effusion noted as well. IMPRESSION: 1. No acute intracranial process. 2. Stable atrophy with chronic small vessel disease and multiple remote ischemic infarcts as above. 3. Right mastoid effusion with opacification of the right middle ear cavity. Clinical correlation for possible otomastoiditis recommended. Electronically Signed   By: Jeannine Boga M.D.   On: 01/04/2017 16:27   US Venous Img Upper Uni Right  Result Date: 01/04/2017 CLINICAL DATA:  Right upper extremity swelling. EXAM: RIGHT UPPER EXTREMITY VENOUS DOPPLER ULTRASOUND TECHNIQUE: Gray-scale sonography with graded compression, as well as color Doppler and duplex ultrasound were performed to evaluate the upper extremity deep  venous system from the level of the subclavian vein and including the jugular, axillary, basilic, radial, ulnar and upper cephalic vein. Spectral Doppler was utilized to evaluate flow at rest and with distal augmentation maneuvers. COMPARISON:  CHEST X-RAY 01/04/2017.  CT CHEST 12/07/2016. FINDINGS: Contralateral Subclavian Vein: Respiratory phasicity is normal and symmetric with the symptomatic side. No evidence of thrombus. Normal compressibility. Internal  Jugular Vein: No evidence of thrombus. Normal compressibility, respiratory phasicity and response to augmentation. Subclavian Vein: No evidence of thrombus. Normal compressibility, respiratory phasicity and response to augmentation. Axillary Vein: Nonocclusive thrombus in the right axillary vein. Cephalic Vein: No evidence of thrombus. Normal compressibility, respiratory phasicity and response to augmentation. Basilic Vein: No evidence of thrombus. Normal compressibility, respiratory phasicity and response to augmentation. Brachial Veins: No evidence of thrombus. Normal compressibility, respiratory phasicity and response to augmentation. Radial Veins: No evidence of thrombus. Normal compressibility, respiratory phasicity and response to augmentation. Ulnar Veins: No evidence of thrombus. Normal compressibility, respiratory phasicity and response to augmentation. Venous Reflux:  None visualized. Other Findings:  None visualized. IMPRESSION: Positive study for DVT. Nonocclusive thrombus in the right axillary vein. Electronically Signed   By: Marcello Moores  Register   On: 01/04/2017 17:13   Dg Chest Portable 1 View  Result Date: 01/04/2017 CLINICAL DATA:  Shortness of breath with tachypnea EXAM: PORTABLE CHEST 1 VIEW COMPARISON:  December 14, 2016 FINDINGS: There are persistent pleural effusions, larger on the right than on the left, with airspace consolidation in both lung bases. Heart is mildly enlarged with pulmonary vascularity demonstrating a degree of pulmonary  venous hypertension. There is aortic atherosclerosis. Patient is status post median sternotomy. No evident adenopathy. Bones are osteoporotic. Calcification is noted in the right carotid artery. IMPRESSION: Evidence of congestive heart failure. Suspect superimposed pneumonia in the lower lung zones. Cardiac silhouette is stable. There is aortic atherosclerosis. There is right carotid artery calcification. Bones are osteoporotic. Electronically Signed   By: Lowella Grip III M.D.   On: 01/04/2017 14:46     IMPRESSION AND PLAN:   75 year old female with past medical history of renal transplant, peripheral vascular disease status post left below the knee amputation, diabetes, hypertension, history of esophageal candidiasis with recent diagnosis of achalasia, GERD, Ron acute atrial fibrillation, coronary artery disease status post bypass who presents to the hospital due to weakness, tachypnea and suspected to have sepsis secondary to pneumonia and urinary tract infection.  1. Sepsis-patient meets sepsis criteria given her tachypnea, leukopenia and chest x-ray findings suggestive of pneumonia along with urinalysis positive for urinary tract infection. -We'll treat the patient with broad-spectrum IV antibiotics with vancomycin, cefepime. Follow blood, urine, sputum cultures. -Presently hemodynamically stable.  2. Pneumonia-chest x-ray findings suggestive of possible superimposed pneumonia in the lower lung fields. We'll treat the patient for HCAP with Vanco, Cefepime - follow blood, sputum Cx.   3. UTI - will treat with IV cefepime, follow urine cultures.  4. Altered mental status-metabolic encephalopathy secondary to underlying sepsis and urinary tract infection. CT head is negative for an acute intrarenal pathology. -Continue treatment as mentioned above and follow mental status.  5. Diabetes type 2 without complication-we'll place the patient on sliding scale insulin, follow blood sugars.  6.  Hypothyroidism-continue Synthroid.  7. History of renal transplant-continue prednisone, Prograf, mycophenolate.  8. Right upper extremity DVT-secondary to her PICC line the patient recently had. Patient is already on Coumadin but INR is still pending. If INR is subtherapeutic we'll bridge her with Lovenox and continue Coumadin for now.  9. History of chronic atrial fibrillation-rate controlled. -Continue Coumadin, follow INR to goal of between 2 and 3.  Patient's overall prognosis is very poor given her comorbidities, had an extensive discussion with the patient's family about goals of care and patient remains a full code for now. We'll get palliative care consult to discuss goals with them.    All the records  are reviewed and case discussed with ED provider. Management plans discussed with the patient, family and they are in agreement.  CODE STATUS: Full code  TOTAL TIME TAKING CARE OF THIS PATIENT: 50 minutes.    Henreitta Leber M.D on 01/04/2017 at 5:33 PM  Between 7am to 6pm - Pager - [phone]  After 6pm go to [email or web address] - password [email or web address] [facility]  Office  [phone]  CC: Primary care physician; Donato Heinz, MD

## 2017-01-04 NOTE — ED Notes (Signed)
Patient transported to CT and US. ?

## 2017-01-04 NOTE — ED Notes (Addendum)
ED Provider at bedside.  Xray at bedside.

## 2017-01-04 NOTE — Plan of Care (Signed)
Patient BP 96/25. Patient not responsive at this time. Temp 96.1 axillary. INR recheck confirmed 4.75.  Bilat Arms continue to swell.  Hubs also requesting to change code status to DNR.  Verbal orders given to change status per Dr. Verdell Carmine.  Dr. Jannifer Franklin also notified to resume care at this time.

## 2017-01-04 NOTE — ED Provider Notes (Signed)
[facility] Emergency Department Provider Note ____________________________________________   I have reviewed the triage vital signs and the triage nursing note.  HISTORY  Chief Complaint Abdominal Pain   Historian History limited from the patient due to altered mental status History provided by the husband as well as the daughter Patient is currently staying at nursing home for the past 3 weeks  HPI Elizabeth [name] is a 76 y.o. female was sent in from the nursing home for increased work of breathing. She was reportedly started on Bactrim antibiotic for "bad urinary tract infection "just yesterday. No reported fevers. No report of vomiting. Slightly decreased interaction in terms of mental status. Increased respiratory rate and increased oxygen requirement. She had been wearing 2 L of oxygen nasal cannula since the past 3 weeks, increased to 4 L today.  No reported pain.  Respirations symptoms are moderate to severe.  Family reports that the patient is a DO NOT RESUSCITATE in terms of no CPR, however if patient needed intubation and ventilation for short-term need, they would be open to that.    Past Medical History:  Diagnosis Date  . Adenomatous polyp   . Anemia   . 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 Atrial fibrillation ([place])    a. on Coumadin. No rate-controlling medications with baseline bradycardia.   . Bradycardia   . CAD (coronary artery disease)    a. s/p 1-vessel CABG to LCx in 2001  . Carotid artery stenosis    mild  . Carotid bruit    left  . Chronic kidney disease    s/p cadaveric renal transplant in 2001  . Colitis   . Compression fracture of L4 lumbar vertebra (HCC)   . Coronary artery disease   . CVA (cerebral infarction)   . Diabetes mellitus   . Diabetic retinopathy   . Esophageal candidiasis ([place])   . Flu 11/22/2016  . Fracture  of right maxilla 09/21/2012  . Fx ankle   . Fx wrist   . Gastritis   . GERD (gastroesophageal reflux disease)   . H/O immunosuppressive therapy   . History of orthostatic hypotension   . Hyperlipidemia   . Hyperparathyroidism   . Hypothyroidism   . IBS (irritable bowel syndrome)   . Ischemic colitis ([place])   . Lactose intolerance   . Leg ulcer ([place])    from poor fitting prosthetic  . Melanoma ([place])   . Nephropathy   . 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 Neuropathy   . Osteoporosis    recurrent fractures  . Pancreatic cyst   . Pneumonia 11/2016  . Pulmonary embolism ([place])   . PVD (peripheral vascular disease) ([place])   . Renal artery stenosis ([place])   . 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 Renal osteodystrophy   . Right orbit fracture ([place]) 09/21/2012  . Stroke ([place])   . TBI (traumatic brain injury) ([place]) 09/30/2012  . Tubular adenoma   . Zygoma fracture ([place]) 09/21/2012    Patient Active Problem List   Diagnosis Date Noted  . Atrial fibrillation with rapid ventricular response ([place])   . AKI (acute kidney injury) ([place]) 12/07/2016  . Kidney transplant recipient   . Herpes simplex   . Failure to thrive (child)   . Hx of BKA, left ([place])   . Acute on chronic respiratory failure ([place]) 11/30/2016  . Hypoxia   .  CHF (congestive heart failure) ([place])   . S/P thoracentesis   . Influenza with pneumonia   . CKD (chronic kidney disease) stage 4, GFR 15-29 ml/min (HCC)   . Chronic anticoagulation   . Community acquired pneumonia of right lower lobe of lung ([place])   . Febrile illness 11/22/2016  . Pleural effusion, right 09/30/2016  . Acquired hypothyroidism 09/01/2014  . Hyperlipidemia 06/18/2014  . Altered mental status 11/19/2013  . Acute encephalopathy 11/19/2013  . Chronic diastolic heart failure ([place]) 10/21/2013  . History of renal transplant 10/21/2013  . Encounter for therapeutic drug monitoring 09/17/2013  . Chronic cystitis 05/13/2013  . UTI (lower  urinary tract infection) 09/27/2012  . Concussion, unspecified 09/21/2012  . 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 Orthostatic hypotension 06/06/2012  . 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 Constipation 10/11/2011  . Nocturnal polyuria 03/31/2011  . Paroxysmal atrial fibrillation ([place]) 01/28/2011  . Encounter for anticoagulation discussion and counseling 01/28/2011  . CVA (cerebral vascular accident) ([place]) 01/28/2011  . DVT of upper extremity (deep vein thrombosis) ([place]) 01/28/2011  . Edema 01/10/2011  . SINUS BRADYCARDIA 12/14/2009  . GERD 12/08/2009  . DIZZINESS 05/20/2009  . Type 2 diabetes mellitus with stage 4 chronic kidney disease, without long-term current use of insulin ([place]) 02/11/2009  . HTN (hypertension), benign 02/09/2009  . Coronary artery disease involving native coronary artery of native heart without angina pectoris 02/09/2009  . CAROTID BRUITS, BILATERAL 02/09/2009  . 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 Irritable bowel syndrome 08/18/2008  . COLONIC POLYPS, HX OF 08/18/2008    Past Surgical History:  Procedure Laterality Date  . ABDOMINAL HYSTERECTOMY    . APPENDECTOMY    . BELOW KNEE LEG AMPUTATION     left, charcott joint from neuropathy  . CATARACT EXTRACTION    . CESAREAN SECTION     x 2  . CHOLECYSTECTOMY    . COLONOSCOPY  01/30/2012   Procedure: COLONOSCOPY;  Surgeon: Milus Banister, MD;  Location: [facility];  Service: Endoscopy;  Laterality: N/A;  . COLONOSCOPY W/ BIOPSIES AND POLYPECTOMY  02/18/2009   adenomatous polyps, diverticulosis, internal hemorrhoids  . CORONARY ARTERY BYPASS GRAFT    . ESOPHAGEAL MANOMETRY N/A 12/29/2016   Procedure: ESOPHAGEAL MANOMETRY (EM);  Surgeon: Jonathon Bellows, MD;  Location: [facility] ENDOSCOPY;  Service: Endoscopy;  Laterality: N/A;  . ESOPHAGOGASTRODUODENOSCOPY  05/02/2012   Procedure: ESOPHAGOGASTRODUODENOSCOPY (EGD);  Surgeon: Gatha Mayer, MD;  Location: [facility];  Service: Endoscopy;  Laterality: N/A;  . ESOPHAGOGASTRODUODENOSCOPY (EGD) WITH PROPOFOL N/A 12/14/2016   Procedure: ESOPHAGOGASTRODUODENOSCOPY (EGD) WITH PROPOFOL;  Surgeon: Jonathon Bellows, MD;  Location: [facility] ENDOSCOPY;  Service: Endoscopy;  Laterality: N/A;  . EUS  02/04/2010   w/FNA, pancreatic cyst  . EYE SURGERY    . IR THORACENTESIS ASP PLEURAL SPACE W/IMG GUIDE  11/24/2016  . KIDNEY TRANSPLANT  08/15/2000   [facility] [place]  . THORACENTESIS  07/2016, 08/2016, 09/2016   Ordered by Dr. Vella Kohler. Surg: Dr.Green, Dr. Anselm Pancoast  . THYROID SURGERY    . TONSILLECTOMY    . UPPER GASTROINTESTINAL ENDOSCOPY  12/01/2009  . VITRECTOMY     right eye    Prior to Admission medications   Medication Sig Start Date End Date Taking? Authorizing Provider  acetaminophen (TYLENOL) 500 MG tablet Take 1,000 mg by mouth every 6 (six) hours as needed for moderate pain or headache.    Yes [provider]  atorvastatin (LIPITOR) 10 MG tablet  Take 1 tablet (10 mg total) by mouth every other day. 10/12/12  Yes Angiulli, Lavon Paganini, PA-C  cholecalciferol (VITAMIN D) 1000 UNITS tablet Take 2,000 Units by mouth daily.    Yes [provider]  docusate sodium (COLACE) 100 MG capsule Take 100 mg by mouth daily as needed for moderate constipation.    Yes [provider]  escitalopram (LEXAPRO) 10 MG tablet Take 10 mg by mouth daily.   Yes [provider]  ferrous sulfate (SM IRON) 325 (65 FE) MG tablet Take 325 mg by mouth daily.    Yes [provider]  HYDROcodone-acetaminophen (NORCO/VICODIN) 5-325 MG tablet Take 1-2 tablets by mouth every 6 (six) hours as needed for severe pain. 12/16/16  Yes Sudini, Alveta Heimlich, MD  hyoscyamine (ANASPAZ) 0.125 MG TBDP disintergrating tablet Place 0.125 mg under the tongue as needed for cramping.    Yes [provider]  insulin detemir (LEVEMIR) 100 UNIT/ML injection Inject 4 Units into the skin daily.    Yes [provider]  insulin lispro (HUMALOG) 100 UNIT/ML injection Inject 2-9 Units into the skin 4 (four) times daily -  before meals and at bedtime. Per sliding scale.   Yes [provider]  levothyroxine (SYNTHROID, LEVOTHROID) 112 MCG tablet Take 112 mcg by mouth daily before breakfast.   Yes [provider]  loperamide (IMODIUM) 2 MG capsule Take 1 capsule (2 mg total) by mouth every 6 (six) hours as needed for diarrhea or loose stools. 12/16/16  Yes Sudini, Alveta Heimlich, MD  magnesium gluconate (MAGONATE) 500 MG tablet Take 1,000 mg by mouth daily.    Yes [provider]  mirtazapine (REMERON) 7.5 MG tablet Take 7.5 mg by mouth at bedtime.  02/15/13  Yes [provider]  Multiple Vitamin (MULTIVITAMIN WITH MINERALS) TABS Take 1 tablet by mouth daily.   Yes [provider]  mycophenolate (MYFORTIC) 360 MG TBEC Take 360 mg by mouth 2 (two) times daily.    Yes [provider]  nitroGLYCERIN (NITROSTAT) 0.4 MG SL tablet Place 1 tablet (0.4 mg total) under the tongue every 5 (five) minutes as needed for chest pain. 11/02/12  Yes Gollan, Kathlene November, MD  ondansetron (ZOFRAN) 4 MG tablet Take 4 mg by mouth every 8 (eight) hours as needed for nausea or vomiting.    Yes [provider]  potassium chloride (K-DUR,KLOR-CON) 10 MEQ tablet Take 10 mEq by mouth daily.   Yes [provider]  predniSONE (DELTASONE) 5 MG tablet Take 5 mg by mouth every morning.    Yes [provider]  promethazine (PHENERGAN) 25 MG tablet Take 1 tablet (25 mg total) by mouth every 6 (six) hours as needed for nausea or vomiting. 11/02/16  Yes Gollan, Kathlene November, MD  tacrolimus (PROGRAF) 0.5 MG capsule Take 2 capsules (1 mg total) by mouth 2 (two) times daily. Take 2mg  in the am & 1mg  at night 12/16/16  Yes Sudini, Artist, MD  torsemide (DEMADEX) 20 MG tablet TAKE ONE TABLET BY MOUTH TWICE DAILY AS NEEDED 05/23/16  Yes Gollan, Kathlene November, MD  warfarin  (COUMADIN) 3 MG tablet Take 1 tablet (3 mg total) by mouth daily at 6 PM. Patient taking differently: Take 2.5 mg by mouth daily at 6 PM.  12/16/16 01/04/17 Yes Sudini, Srikar, MD  enoxaparin (LOVENOX) 60 MG/0.6ML injection Inject 0.55 mLs (55 mg total) into the skin daily. Patient not taking: Reported on 01/04/2017 12/16/16 12/20/16  Hillary Bow, MD  feeding supplement, [place], ([place])  LIQD Take 237 mLs by mouth 3 (three) times daily between meals. 12/16/16   Hillary Bow, MD  metoprolol tartrate (LOPRESSOR) 25 MG tablet Take 0.5 tablets (12.5 mg total) by mouth 2 (two) times daily. Patient not taking: Reported on 01/04/2017 12/16/16   Hillary Bow, MD  omeprazole (PRILOSEC) 40 MG capsule Take 1 capsule (40 mg total) by mouth 2 (two) times daily before a meal. Patient not taking: Reported on 01/04/2017 12/16/16   Hillary Bow, MD  [facility] VERIO test strip USE AS DIRECTED 3 TIMES A DAY 11/14/16   [provider]    Allergies  Allergen Reactions  . Azithromycin Other (See Comments)    Upset stomach  . Erythromycin Nausea And Vomiting  . Fentanyl Nausea And Vomiting  . Percocet [Oxycodone-Acetaminophen] Nausea And Vomiting    Family History  Problem Relation Age of Onset  . Stroke Mother 17  . Heart attack Mother   . Heart attack Father 23  . Breast cancer Sister   . Colon cancer Neg Hx   . Diabetes Neg Hx     Social History Social History  Substance Use Topics  . Smoking status: Never Smoker  . Smokeless tobacco: Never Used  . Alcohol use No    Review of Systems  Constitutional: Negative for fever. Eyes: Negative for Red eyes ENT: Negative for runny nose Cardiovascular: Negative for chest pain. Respiratory: Positive for shortness of breath. Gastrointestinal: Negative for vomiting Genitourinary: Negative for dysuria. Musculoskeletal: Negative for back pain. Skin: Negative for rash. Neurological: Negative for  headache.  ____________________________________________   PHYSICAL EXAM:  VITAL SIGNS: ED Triage Vitals  Enc Vitals Group     BP 01/04/17 1331 (!) 121/99     Pulse Rate 01/04/17 1331 64     Resp 01/04/17 1331 (!) 30     Temp 01/04/17 1335 97.6 F (36.4 C)     Temp Source 01/04/17 1335 Oral     SpO2 01/04/17 1330 94 %     Weight 01/04/17 1332 114 lb (51.7 kg)     Height 01/04/17 1332 5\' 3"  (1.6 m)     Head Circumference --      [place] Flow --      Pain Score 01/04/17 1330 4     Pain Loc --      Pain Edu? --      Excl. in [place]? --      Constitutional: Alert To voice and interacts a tiny bit. Increased respiratory rate. HEENT   Head: Normocephalic and atraumatic.      Eyes: Conjunctivae are normal. PER.      Ears:         Nose: No congestion/rhinnorhea.   Mouth/Throat: Mucous membranes are mildly dry.   Neck: No stridor. Cardiovascular/Chest: Normal rate, regular rhythm.  No murmurs, rubs, or gallops. Respiratory: Tachypnea with mild subcostal retractions. No wheezing. Moderate decreased breath sounds throughout but very decreased breath sounds bilateral bases. Mild rhonchi. Gastrointestinal: Soft. No distention, no guarding, no rebound. Nontender.  Obese  Genitourinary/rectal:Deferred Musculoskeletal: Severe right upper extremity pitting edema at the elbow which does not appear cellulitic, but is a little purplish discoloration. Neurologic: Not really speaking to me. No facial droop. She does appear to move for shortness, but is very weak all over. Skin:  Skin is warm, dry and intact. No rash noted. Psychiatric: No agitation ____________________________________________  LABS (pertinent positives/negatives)  Labs Reviewed  COMPREHENSIVE METABOLIC PANEL - Abnormal; Notable for the following:  Result Value   Chloride 113 (*)    Glucose, Bld 47 (*)    BUN 44 (*)    Creatinine, Ser 1.23 (*)    Calcium 8.1 (*)    Total Protein 4.2 (*)    Albumin 2.0 (*)     ALT 11 (*)    GFR calc non Af Amer 41 (*)    GFR calc Af Amer 48 (*)    Anion gap 4 (*)    All other components within normal limits  CBC - Abnormal; Notable for the following:    WBC 3.0 (*)    RBC 3.22 (*)    Hemoglobin 10.3 (*)    HCT 31.7 (*)    RDW 20.4 (*)    All other components within normal limits  BRAIN NATRIURETIC PEPTIDE - Abnormal; Notable for the following:    B Natriuretic Peptide 615.0 (*)    All other components within normal limits  TROPONIN I - Abnormal; Notable for the following:    Troponin I 0.04 (*)    All other components within normal limits  URINE CULTURE  CULTURE, BLOOD (ROUTINE X 2)  CULTURE, BLOOD (ROUTINE X 2)  LIPASE, BLOOD  LACTIC ACID, PLASMA  URINALYSIS, COMPLETE (UACMP) WITH MICROSCOPIC    ____________________________________________    EKG I, Lisa Roca, MD, the attending physician have personally viewed and interpreted all ECGs.  67 bpm. Normal sinus rhythm. Narrow QRS. Normal axis. Nonspecific T-wave ____________________________________________  RADIOLOGY All Xrays were viewed by me. Imaging interpreted by Radiologist.  Chest x-ray portable one view:  IMPRESSION: Evidence of congestive heart failure. Suspect superimposed pneumonia in the lower lung zones. Cardiac silhouette is stable. There is aortic atherosclerosis. There is right carotid artery calcification. Bones are osteoporotic.  Ultrasound right upper extremity: Pending __________________________________________  PROCEDURES  Procedure(s) performed: None  Critical Care performed: CRITICAL CARE Performed by: Lisa Roca   Total critical care time: 30 minutes  Critical care time was exclusive of separately billable procedures and treating other patients.  Critical care was necessary to treat or prevent imminent or life-threatening deterioration.  Critical care was time spent personally by me on the following activities: development of treatment plan with  patient and/or surrogate as well as nursing, discussions with consultants, evaluation of patient's response to treatment, examination of patient, obtaining history from patient or surrogate, ordering and performing treatments and interventions, ordering and review of laboratory studies, ordering and review of radiographic studies, pulse oximetry and re-evaluation of patient's condition.   ____________________________________________   ED COURSE / ASSESSMENT AND PLAN  Pertinent labs & imaging results that were available during my care of the patient were reviewed by me and considered in my medical decision making (see chart for details).  Patient was sent in because of increased breathing problems, although she also was started on antibiotic Bactrim for urinary tract infection yesterday.  She has increased requirement of oxygen from baseline. She is 91% on 3 L and was increased to 4 L and 94% on 4 L nasal cannula.  Essentially she's been having a decline in mental status over the past several weeks, however is a little bit less interactive today. She is tachypnea with low oxygen and x-ray concerning for superimposed pneumonia at the bases and I am going to cover her and start treating her for possible sepsis for a possible Hospital/healthcare acquired pneumonia. She was started on vancomycin and cefepime.  She also looks like she may be somewhat volume overloaded with a history of CHF  and definite swelling arrival at [facility]. Wouldn't follow fluids clinically, concerned about overloading her.  Also obtained ultrasound of the right upper extremity of concern for possible DVT given the history of her fairly recent PICC line on that side.  Urinalysis catheter sample had to be repeated for a second time, pending at time of hospitalist consultation for admission.   CONSULTATIONS:  Hospitalist for admission.   Patient / Family / Caregiver informed of clinical course, medical decision-making  process, and agree with plan.   ___________________________________________   FINAL CLINICAL IMPRESSION(S) / ED DIAGNOSES   Final diagnoses:  Pneumonia, unspecified organism  Sepsis, due to unspecified organism ([place])  Swelling              Note: This dictation was prepared with Dragon dictation. Any transcriptional errors that result from this process are unintentional    Lisa Roca, MD 01/04/17 1523

## 2017-01-04 NOTE — ED Notes (Signed)
Admitting MD in room to assess patient.  Will continue to monitor.   

## 2017-01-04 NOTE — ED Notes (Signed)
This RN was unable to draw blood from patient for PT/INR.  Notified the lab to come and draw the blood work.  Lab verbalized understanding.

## 2017-01-04 NOTE — Progress Notes (Addendum)
Spoke with patient's husband, patient now DNR status.  Patient's BP low, 90s/25.  Septic, but husband states that patient's BP at home is usually 90s/30s.  Patient is very ill, but otherwise clinically stable.  We will monitor her BP closely and if she has any further decrease she will need BP supportive care (fluids and/or vasopessors).  Husband and clinical staff aware of plan.  Jacqulyn Bath [facility] 01/04/2017, 10:11 PM

## 2017-01-04 NOTE — ED Notes (Addendum)
Elevated troponin of 0.04 reported verbally to Dr Reita Cliche. Was acknowledged with no new orders.

## 2017-01-04 NOTE — ED Triage Notes (Addendum)
Pt arrives to ER via [facility] from [facility] for lower abdominal pain. Pt dx with UTI and started on Bactrim yesterday. Pt c/o abdominal pain today. Pt appears fatigued, pale and sweaty. Pt alert to self at time.

## 2017-01-04 NOTE — ED Notes (Signed)
Pt oxygen turned up to 4L Elizabeth [name].

## 2017-01-04 NOTE — Progress Notes (Signed)
ANTICOAGULATION CONSULT NOTE - Initial Consult  Pharmacy Consult for warfarin dosing and monitoring  Indication: Atrial fibrillation and Right upper extremity DVT   Allergies  Allergen Reactions  . Azithromycin Other (See Comments)    Upset stomach  . Erythromycin Nausea And Vomiting  . Fentanyl Nausea And Vomiting  . Percocet [Oxycodone-Acetaminophen] Nausea And Vomiting    Patient Measurements: Height: 5\' 3"  (160 cm) Weight: 130 lb 14.4 oz (59.4 kg) IBW/kg (Calculated) : 52.4  Vital Signs: Temp: 97.6 F (36.4 C) (05/16 1335) Temp Source: Oral (05/16 1335) BP: 125/64 (05/16 1853) Pulse Rate: 56 (05/16 1853)  Labs:  Recent Labs  01/04/17 1339  HGB 10.3*  HCT 31.7*  PLT 191  LABPROT 45.9*  INR 4.75*  CREATININE 1.23*  TROPONINI 0.04*    Estimated Creatinine Clearance: 31.7 mL/min (A) (by C-G formula based on SCr of 1.23 mg/dL (H)).   Medical History: Past Medical History:  Diagnosis Date  . Adenomatous polyp   . Anemia   . 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 Atrial fibrillation ([place])    a. on Coumadin. No rate-controlling medications with baseline bradycardia.   . Bradycardia   . CAD (coronary artery disease)    a. s/p 1-vessel CABG to LCx in 2001  . Carotid artery stenosis    mild  . Carotid bruit    left  . Chronic kidney disease    s/p cadaveric renal transplant in 2001  . Colitis   . Compression fracture of L4 lumbar vertebra (HCC)   . Coronary artery disease   . CVA (cerebral infarction)   . Diabetes mellitus   . Diabetic retinopathy   . Esophageal candidiasis ([place])   . Flu 11/22/2016  . Fracture of right maxilla 09/21/2012  . Fx ankle   . Fx wrist   . Gastritis   . GERD (gastroesophageal reflux disease)   . H/O immunosuppressive therapy   . History of orthostatic hypotension   . Hyperlipidemia   . Hyperparathyroidism   . Hypothyroidism   .  IBS (irritable bowel syndrome)   . Ischemic colitis ([place])   . Lactose intolerance   . Leg ulcer ([place])    from poor fitting prosthetic  . Melanoma ([place])   . Nephropathy   . 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 Neuropathy   . Osteoporosis    recurrent fractures  . Pancreatic cyst   . Pneumonia 11/2016  . Pulmonary embolism ([place])   . PVD (peripheral vascular disease) ([place])   . Renal artery stenosis ([place])   . 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 Renal osteodystrophy   . Right orbit fracture ([place]) 09/21/2012  . Stroke ([place])   . TBI (traumatic brain injury) ([place]) 09/30/2012  . Tubular adenoma   . Zygoma fracture ([place]) 09/21/2012    Assessment: 76 yo female admitted with sepsis with PMH of A. Fib and  Right upper extremity DVT. Pharmacy consulted for warfarin dosing and monitoring.   Home Regimen: Warfarin 2.5mg  daily   DATE  INR DOSE  5/16  4.75 HELD   Goal of Therapy:  INR 2-3 Monitor platelets by anticoagulation protocol: Yes   Plan:  Patients INR is supratherapeutic, will hold dose tonight and recheck INR with AM labs. Patient is receiving Abx - so will need INRs daily per protocol.   Pernell Dupre, PharmD, BCPS Clinical Pharmacist 01/04/2017 7:47 PM

## 2017-01-04 NOTE — Plan of Care (Addendum)
Dr text to contact about critical INR of 4.75.  Gave verbal orders to re-draw to confirm value.  Orders placed and lab contacted.

## 2017-01-05 DIAGNOSIS — I5033 Acute on chronic diastolic (congestive) heart failure: Secondary | ICD-10-CM

## 2017-01-05 DIAGNOSIS — Z7189 Other specified counseling: Secondary | ICD-10-CM

## 2017-01-05 DIAGNOSIS — Z515 Encounter for palliative care: Secondary | ICD-10-CM

## 2017-01-05 LAB — GLUCOSE, CAPILLARY
Glucose-Capillary: 74 mg/dL (ref 65–99)
Glucose-Capillary: 84 mg/dL (ref 65–99)
Glucose-Capillary: 155 mg/dL — ABNORMAL HIGH (ref 65–99)
Glucose-Capillary: 124 mg/dL — ABNORMAL HIGH (ref 65–99)

## 2017-01-05 LAB — CBC
HCT: 31.5 % — ABNORMAL LOW (ref 35.0–47.0)
HEMOGLOBIN: 10.3 g/dL — AB (ref 12.0–16.0)
MCH: 31.5 pg (ref 26.0–34.0)
MCHC: 32.6 g/dL (ref 32.0–36.0)
MCV: 96.5 fL (ref 80.0–100.0)
Platelets: 170 10*3/uL (ref 150–440)
RBC: 3.27 MIL/uL — AB (ref 3.80–5.20)
RDW: 20 % — ABNORMAL HIGH (ref 11.5–14.5)
WBC: 2.7 10*3/uL — AB (ref 3.6–11.0)

## 2017-01-05 LAB — BASIC METABOLIC PANEL
ANION GAP: 5 (ref 5–15)
BUN: 42 mg/dL — ABNORMAL HIGH (ref 6–20)
CO2: 26 mmol/L (ref 22–32)
Calcium: 8.3 mg/dL — ABNORMAL LOW (ref 8.9–10.3)
Chloride: 112 mmol/L — ABNORMAL HIGH (ref 101–111)
Creatinine, Ser: 1.12 mg/dL — ABNORMAL HIGH (ref 0.44–1.00)
GFR, EST AFRICAN AMERICAN: 53 mL/min — AB (ref >60)
GFR, EST NON AFRICAN AMERICAN: 46 mL/min — AB (ref >60)
Glucose, Bld: 73 mg/dL (ref 65–99)
POTASSIUM: 5.3 mmol/L — AB (ref 3.5–5.1)
Sodium: 143 mmol/L (ref 135–145)

## 2017-01-05 LAB — URINE CULTURE

## 2017-01-05 LAB — PROTIME-INR
INR: 4.35 — AB
PROTHROMBIN TIME: 42.8 s — AB (ref 11.4–15.2)

## 2017-01-05 MED ORDER — FUROSEMIDE 10 MG/ML IJ SOLN
40.0000 mg | Freq: Once | INTRAMUSCULAR | Status: AC
  Administered 2017-01-05: 10:00:00 40 mg via INTRAVENOUS
  Filled 2017-01-05: qty 4

## 2017-01-05 MED ORDER — ENSURE ENLIVE PO LIQD
237.0000 mL | Freq: Two times a day (BID) | ORAL | Status: DC
  Administered 2017-01-05 – 2017-01-07 (×3): 237 mL via ORAL

## 2017-01-05 NOTE — Progress Notes (Signed)
PMT meeting scheduled with family at 1:00 today.  Imogene Burn, [place] [facility] Pager: [phone]

## 2017-01-05 NOTE — Progress Notes (Signed)
PT Cancellation Note  Patient Details Name: Elizabeth [name] MRN: [id number] DOB: 02-Nov-1940   Cancelled Treatment:    Reason Eval/Treat Not Completed: Medical issues which prohibited therapy.  Nursing asks hold and ck again tomorrow to see if PT is even appropriate.   Ramond Dial 01/05/2017, 9:16 AM   Mee Hives, PT MS Acute Rehab Dept. Number: [place] and [place]

## 2017-01-05 NOTE — Evaluation (Signed)
Clinical/Bedside Swallow Evaluation Patient Details  Name: Elizabeth [name] MRN: [id number] Date of Birth: 08-21-1941  Today's Date: 01/05/2017 Time: SLP Start Time (ACUTE ONLY): 1035 SLP Stop Time (ACUTE ONLY): 1135 SLP Time Calculation (min) (ACUTE ONLY): 60 min  Past Medical History:  Past Medical History:  Diagnosis Date  . Adenomatous polyp   . Anemia   . 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 Atrial fibrillation ([place])    a. on Coumadin. No rate-controlling medications with baseline bradycardia.   . Bradycardia   . CAD (coronary artery disease)    a. s/p 1-vessel CABG to LCx in 2001  . Carotid artery stenosis    mild  . Carotid bruit    left  . Chronic kidney disease    s/p cadaveric renal transplant in 2001  . Colitis   . Compression fracture of L4 lumbar vertebra (HCC)   . Coronary artery disease   . CVA (cerebral infarction)   . Diabetes mellitus   . Diabetic retinopathy   . Esophageal candidiasis ([place])   . Flu 11/22/2016  . Fracture of right maxilla 09/21/2012  . Fx ankle   . Fx wrist   . Gastritis   . GERD (gastroesophageal reflux disease)   . H/O immunosuppressive therapy   . History of orthostatic hypotension   . Hyperlipidemia   . Hyperparathyroidism   . Hypothyroidism   . IBS (irritable bowel syndrome)   . Ischemic colitis ([place])   . Lactose intolerance   . Leg ulcer ([place])    from poor fitting prosthetic  . Melanoma ([place])   . Nephropathy   . 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 Neuropathy   . Osteoporosis    recurrent fractures  . Pancreatic cyst   . Pneumonia 11/2016  . Pulmonary embolism ([place])   . PVD (peripheral vascular disease) ([place])   . Renal artery stenosis ([place])   . 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 Renal osteodystrophy   . Right orbit fracture ([place]) 09/21/2012  . Stroke ([place])   . TBI (traumatic brain injury) ([place]) 09/30/2012   . Tubular adenoma   . Zygoma fracture ([place]) 09/21/2012   Past Surgical History:  Past Surgical History:  Procedure Laterality Date  . ABDOMINAL HYSTERECTOMY    . APPENDECTOMY    . BELOW KNEE LEG AMPUTATION     left, charcott joint from neuropathy  . CATARACT EXTRACTION    . CESAREAN SECTION     x 2  . CHOLECYSTECTOMY    . COLONOSCOPY  01/30/2012   Procedure: COLONOSCOPY;  Surgeon: Milus Banister, MD;  Location: [facility];  Service: Endoscopy;  Laterality: N/A;  . COLONOSCOPY W/ BIOPSIES AND POLYPECTOMY  02/18/2009   adenomatous polyps, diverticulosis, internal hemorrhoids  . CORONARY ARTERY BYPASS GRAFT    . ESOPHAGEAL MANOMETRY N/A 12/29/2016   Procedure: ESOPHAGEAL MANOMETRY (EM);  Surgeon: Jonathon Bellows, MD;  Location[facility] ENDOSCOPY;  Service: Endoscopy;  Laterality: N/A;  . ESOPHAGOGASTRODUODENOSCOPY  05/02/2012   Procedure: ESOPHAGOGASTRODUODENOSCOPY (EGD);  Surgeon: Gatha Mayer, MD;  Location: [facility];  Service: Endoscopy;  Laterality: N/A;  . ESOPHAGOGASTRODUODENOSCOPY (EGD) WITH PROPOFOL N/A 12/14/2016   Procedure: ESOPHAGOGASTRODUODENOSCOPY (EGD) WITH PROPOFOL;  Surgeon: Jonathon Bellows, MD;  Location: [facility] ENDOSCOPY;  Service: Endoscopy;  Laterality: N/A;  . EUS  02/04/2010   w/FNA, pancreatic cyst  . EYE SURGERY    . IR THORACENTESIS  ASP PLEURAL SPACE W/IMG GUIDE  11/24/2016  . KIDNEY TRANSPLANT  08/15/2000   [facility] in [place]  . THORACENTESIS  07/2016, 08/2016, 09/2016   Ordered by Dr. Vella Kohler. Surg: Dr.Green, Dr. Anselm Pancoast  . THYROID SURGERY    . TONSILLECTOMY    . UPPER GASTROINTESTINAL ENDOSCOPY  12/01/2009  . VITRECTOMY     right eye   HPI:  Pt  is a 76 y.o. female with a known history of multiple medical issues including Esophageal phase dysmotility/dysphagia, Hypertension, diabetes, chronic diastolic CHF, kidney transplant on immunosuppressants, CKD stage III, history of PE presents to the emergency room from SNF. Patient herself is a very poor historian  given her lethargy and weakness therefore most of the history obtained from the husband and daughter at bedside. As per the husband patient had been doing well until the past few days she has been weak, having some increased respiratory rate and thought she had a urinary tract infection at the Nursing facility and was started on Bactrim but without much improvement and therefore sent to the ER for further evaluation. In the emergency room patient met sepsis criteria given her tachypnea, leukopenia and also chest x-ray findings suggestive of pneumonia and also abnormal urinalysis suggestive of urinary tract infection. Hospitalist services were contacted further treatment and evaluation. Patient does have chronic on and off nausea, vomiting and diarrhea per report. She was admitted to [facility] in [place] earlier this year as well as to this facility last month for similar complaints along with influenza B and shortness of breath. She had right thoracentesis with 1 L transudate removed. Treated with Tamiflu and Levaquin. Finished antibiotics in the hospital. She continued to have nausea and vomiting along with diarrhea; due to gastroparesis and influenza. Pt does have reports of foods "getting stuck in her mid-chest" frequently and has been seen by GI w/ Esophageal dilitation performed in the past 2-3 weeks(?). Pt has been eating a pureed consistency diet to aid Esophageal motility and clearing per Husband.    Assessment / Plan / Recommendation Clinical Impression  Pt appears to present w/ min oropharyngeal phase dysphagia, moderate Esophageal phase dysmotility as reported by GI assessment/MD of which can impact pt's oropharyngeal phases of swallowing. This presenation increases pt's risk for aspiration both during swallowing and post swallowing(Esophageal regurgitation/reflux). Pt consumed trials of thin liquids and purees w/ no consistent, overt s/s of aspiration w/ po trials - aspiration w/ initial  trial of thin liquid x1 w/ no further occurring. Oral phase wfl for bolus management and clearing of trials assessed; pt is not taking a solid diet per Husband d/t GI/Esophageal dysmotility. Pt/family endorse s/s of Reflux and Esophageal phase Dysmotility including regurgitation/vomiting of food material at times. Any regurgitation increases pt's risk for aspiration of Reflux material thus pulmonary issues/decline. Recommend continue GI f/u for management post recent assessment. Recommend a modified diet w/ thin liquids; general aspiration and reflux precautions; Pills in Puree if easier, safer for swallowing for pt; assistance at meals to aid pt in her self-feeding and pacing for Esophageal motility. NSG/MD updated. Family agreed. Recommend Dietician f/u for nutritional supplement - may tolerate more of liquid supplement, Magic Cup.  SLP Visit Diagnosis: Dysphagia, pharyngeal phase (R13.13);Dysphagia, pharyngoesophageal phase (R13.14)    Aspiration Risk  Mild aspiration risk (including d/t Esophageal dysmotility/regurgitation/Reflux)    Diet Recommendation  Dysphagia level 1 (puree) w/ thin liquids; strict aspiration precautions monitoring thin liquids; feeding support at meals d/t fatigue issues w/ tasks, pacing d/t  Pulmonary status currently  Medication Administration: Whole meds with puree (but may prefer w/ water - monitor carefully)    Other  Recommendations Recommended Consults: Consider GI evaluation;Consider esophageal assessment Oral Care Recommendations: Oral care BID;Staff/trained caregiver to provide oral care   Follow up Recommendations Skilled Nursing facility (education of aspiration and reflux precautions )      Frequency and Duration min 2x/week  1 week       Prognosis Prognosis for Safe Diet Advancement: Fair Barriers to Reach Goals: Time post onset;Severity of deficits (unsure of Cognitive status baseline)      Swallow Study   General Date of Onset: 01/04/17 HPI: Pt   is a 76 y.o. female with a known history of multiple medical issues including Esophageal phase dysmotility/dysphagia, Hypertension, diabetes, chronic diastolic CHF, kidney transplant on immunosuppressants, CKD stage III, history of PE presents to the emergency room from SNF. Patient herself is a very poor historian given her lethargy and weakness therefore most of the history obtained from the husband and daughter at bedside. As per the husband patient had been doing well until the past few days she has been weak, having some increased respiratory rate and thought she had a urinary tract infection at the Nursing facility and was started on Bactrim but without much improvement and therefore sent to the ER for further evaluation. In the emergency room patient met sepsis criteria given her tachypnea, leukopenia and also chest x-ray findings suggestive of pneumonia and also abnormal urinalysis suggestive of urinary tract infection. Hospitalist services were contacted further treatment and evaluation. Patient does have chronic on and off nausea, vomiting and diarrhea per report. She was admitted to [facility] in [place] earlier this year as well as to this facility last month for similar complaints along with influenza B and shortness of breath. She had right thoracentesis with 1 L transudate removed. Treated with Tamiflu and Levaquin. Finished antibiotics in the hospital. She continued to have nausea and vomiting along with diarrhea; due to gastroparesis and influenza. Pt does have reports of foods "getting stuck in her mid-chest" frequently and has been seen by GI w/ Esophageal dilitation performed in the past 2-3 weeks(?). Pt has been eating a pureed consistency diet to aid Esophageal motility and clearing per Husband.  Type of Study: Bedside Swallow Evaluation Previous Swallow Assessment: 12/07/2016 Diet Prior to this Study: Dysphagia 1 (puree);Thin liquids Temperature Spikes Noted: No (wbc  2.7) Respiratory Status: Nasal cannula (3 liters) History of Recent Intubation: No Behavior/Cognition: Alert;Cooperative;Pleasant mood;Distractible;Requires cueing Oral Cavity Assessment: Dry Oral Care Completed by SLP: Recent completion by staff Oral Cavity - Dentition: Adequate natural dentition;Missing dentition Vision: Impaired for self-feeding (min) Self-Feeding Abilities: Needs assist;Needs set up;Total assist Patient Positioning: Upright in bed Baseline Vocal Quality: Normal;Low vocal intensity Volitional Cough:  (Fair) Volitional Swallow: Able to elicit    Oral/Motor/Sensory Function Overall Oral Motor/Sensory Function: Within functional limits   Ice Chips Ice chips: Within functional limits   Thin Liquid Thin Liquid: Impaired Presentation: Straw;Cup;Self Fed (assisted; 10+ trials) Oral Phase Impairments:  (none) Oral Phase Functional Implications:  (none) Pharyngeal  Phase Impairments: Cough - Immediate (x1 trial of several trials) Other Comments: may have been too much too quickly    Nectar Thick Nectar Thick Liquid: Not tested   Honey Thick Honey Thick Liquid: Not tested   Puree Puree: Within functional limits Presentation: Spoon (fed; 5 trials)   Solid   GO   Solid: Not tested Other Comments: GI dysmotility issues; baseline pureed  diet         Orinda Kenner, MS, CCC-SLP Chenae Brager 01/05/2017,2:04 PM

## 2017-01-05 NOTE — Consult Note (Signed)
Consultation Note Date: 01/05/2017   Patient Name: MARIFER HURD  DOB: 1941-01-24  MRN: [id number]  Age / Sex: 76 y.o., female  PCP: Donato Heinz, MD Referring Physician: Max Sane, MD  Reason for Consultation: Establishing goals of care  HPI/Patient Profile: 76 y.o. female  with past medical history of multiple CVAs, DM, renal transplant on immunosuppressive therapy, recurrent UTIs, CAD s/p CABG, PVD s/p LBKA, Afib, and diastolic CHF, chronic pleural effusion s/p multiple thoracentesis, and orthostatic hypotension  who was admitted on 01/04/2017 with weakness and tachypnea.  Initial work up reveals likely CHF exacerbation with possible PNA and UTI.  This is her 4th hospitalization in the last 6 weeks.   Of note the patient had an echo cardiogram at [facility] in February 2018.  Her LVEF was 55%, she had moderate mitral stenosis and mitral regurg, her right ventricular pressure was high (approx 55%)  Clinical Assessment and Goals of Care:  I have reviewed medical records including EPIC notes, labs and imaging, received report from the care team, assessed the patient and then met at the bedside along with her husband and daughter  to discuss diagnosis prognosis, GOC, EOL wishes, disposition and options.  I introduced Palliative Medicine as specialized medical care for people living with serious illness. It focuses on providing relief from the symptoms and stress of a serious illness. The goal is to improve quality of life for both the patient and the family.  We discussed a brief life review of the patient.  She and her husband are originally from [place].  She worked as a Marine scientist in a Artist.  She has children (some local and some in [place]) and 5 grandchildren.  She has had a very complex PMH including multiple falls and fractures likely related to orthostatic hypotension.  She suffered multiple  strokes and is considered to have a "traumatic brain injury".  Most recently she has had difficulty with chronic regurgitation and dysphagia.  EGD and mannometry was done - results are pending - achalasia is the differential diagnosis.   She has been living at home with her husband and doing well, but after her most recent hospitalization she has been at [facility] for the last few weeks.  Her husband [name] is her primary care taker and HCPOA.  Her daughter Nira Conn is her secondary HCPOA.  She is a Engineer, manufacturing.  With regard to nutritional and functional status - the patient is wheel chair bound.  She has not walked in over 3 years.  Her husband prefers to care for her in the home but needs for her to be able to stand and pivot in order to care for her himself.  Her nutritional status has suffered recently due to severe dysphagia and frequent regurgitation of both food and water. We discussed their current illness and what it means in the larger context of their on-going co-morbidities.  Despite her many co-morbidities Mrs. Shannahan does not have 1 diagnosis that will take her life - rather it is the culmination of  her many problems that appear to be slowly weakening her over time.  She will most likely continue to dwindle.  Her family is wrestling with the question of when is the right time to invoke hospice and comfort measures rather than continuing to come to the hospice and seek aggressive medical treatment.  Heather comments that her mother has become tired and has commented several times over the last few months "I wish I would just die".  We discussed code status.  The family would like for Mrs. Shook to receive pressors and non-invasive respiratory support (bipap) if necessary.  They would not want her to have chest compressions or intubation.  Thus, I will change her to a limited code.  Mrs. Kisner was on HD for 6 years prior to her kidney transplant.  She is quite clear that she would never  want HD again.  We discussed PEG tubes (Mr. Little father had one temporarily at 44 yo!) but came to no firm decisions about whether they would want that for Mrs. Bufkin or not today.  Hospice and Palliative Care services outpatient were explained.  Mr. Manrique would like Palliative care to begin to follow his wife at SNF and at home.  Questions and concerns were addressed.  Hard Choices booklet left for review. The family was encouraged to call with questions or concerns.  PMT will continue to support holistically.  Primary Decision Maker:  HCPOA Husband [name] Prestage and dtr Heather.    SUMMARY OF RECOMMENDATIONS     Family would like mannometry results from testing done 12/29/2016 if possible.  Family mentions that patient responds better to Torsemide rather than lasix if diuresis is needed.  Limited code:  Pressors and Bipap are ok.  No intubation, no chest compressions  PMT will follow up to continue [place] conversation tomorrow (5/18) time permitting.  Please have Palliative Care follow outpatient at SNF.  Goals:   No hemodialysis ever again.   Code Status/Advance Care Planning:  Limited code    Symptom Management:   Discussed with Dr. Manuella Ghazi and gave 1 dose of IV lasix 40 mg as patient appeared in some distress.   Additional Recommendations (Limitations, Scope, Preferences):  Full Scope Treatment  Palliative Prophylaxis:   Bowel Regimen, Frequent Pain Assessment and Turn Reposition  Psycho-social/Spiritual:   Desire for further Chaplaincy support: yes  Prognosis:   < 6 months given progressive decline, recurrent hospitalizations, recurrent infections, severe dysphagia, limited mobility  Discharge Planning: [facility] [place] for rehab with Palliative care service follow-up      Primary Diagnoses: Present on Admission: . Sepsis ([place])   I have reviewed the medical record, interviewed the patient and family, and examined the patient. The  following aspects are pertinent.  Past Medical History:  Diagnosis Date  . Adenomatous polyp   . Anemia   . 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 Atrial fibrillation ([place])    a. on Coumadin. No rate-controlling medications with baseline bradycardia.   . Bradycardia   . CAD (coronary artery disease)    a. s/p 1-vessel CABG to LCx in 2001  . Carotid artery stenosis    mild  . Carotid bruit    left  . Chronic kidney disease    s/p cadaveric renal transplant in 2001  . Colitis   . Compression fracture of L4 lumbar vertebra (HCC)   . Coronary artery disease   .  CVA (cerebral infarction)   . Diabetes mellitus   . Diabetic retinopathy   . Esophageal candidiasis ([place])   . Flu 11/22/2016  . Fracture of right maxilla 09/21/2012  . Fx ankle   . Fx wrist   . Gastritis   . GERD (gastroesophageal reflux disease)   . H/O immunosuppressive therapy   . History of orthostatic hypotension   . Hyperlipidemia   . Hyperparathyroidism   . Hypothyroidism   . IBS (irritable bowel syndrome)   . Ischemic colitis ([place])   . Lactose intolerance   . Leg ulcer ([place])    from poor fitting prosthetic  . Melanoma ([place])   . Nephropathy   . 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 Neuropathy   . Osteoporosis    recurrent fractures  . Pancreatic cyst   . Pneumonia 11/2016  . Pulmonary embolism ([place])   . PVD (peripheral vascular disease) ([place])   . Renal artery stenosis ([place])   . 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 Renal osteodystrophy   . Right orbit fracture ([place]) 09/21/2012  . Stroke ([place])   . TBI (traumatic brain injury) ([place]) 09/30/2012  . Tubular adenoma   . Zygoma fracture ([place]) 09/21/2012   Social History   Social History  . Marital status: Married    Spouse name: N/A  . Number of children: 2  . Years of education: N/A   Occupational History  . retired Marine scientist Retired   Social  History Main Topics  . Smoking status: Never Smoker  . Smokeless tobacco: Never Used  . Alcohol use No  . Drug use: No  . Sexual activity: Not Asked   Other Topics Concern  . None   Social History Narrative  . None   Family History  Problem Relation Age of Onset  . Stroke Mother 22  . Heart attack Mother   . Heart attack Father 71  . Breast cancer Sister   . Colon cancer Neg Hx   . Diabetes Neg Hx    Scheduled Meds: . atorvastatin  10 mg Oral QODAY  . cholecalciferol  2,000 Units Oral Daily  . escitalopram  10 mg Oral Daily  . feeding supplement (GLUCERNA SHAKE)  237 mL Oral TID BM  . ferrous sulfate  325 mg Oral Daily  . furosemide  40 mg Intravenous Once  . insulin aspart  0-5 Units Subcutaneous QHS  . insulin aspart  0-9 Units Subcutaneous TID WC  . levothyroxine  112 mcg Oral QAC breakfast  . mirtazapine  7.5 mg Oral QHS  . multivitamin with minerals  1 tablet Oral Daily  . mycophenolate  360 mg Oral BID  . potassium chloride  10 mEq Oral Daily  . predniSONE  5 mg Oral q morning - 10a  . tacrolimus  1 mg Oral QHS  . tacrolimus  2 mg Oral q morning - 10a  . Warfarin - Pharmacist Dosing Inpatient   Does not apply q1800   Continuous Infusions: . ceFEPime (MAXIPIME) IV    . vancomycin Stopped (01/05/17 0528)   PRN Meds:.acetaminophen **OR** acetaminophen, docusate sodium, HYDROcodone-acetaminophen, ondansetron **OR** ondansetron (ZOFRAN) IV, promethazine Allergies  Allergen Reactions  . Azithromycin Other (See Comments)    Upset stomach  . Erythromycin Nausea And Vomiting  . Fentanyl Nausea And Vomiting  . Percocet [Oxycodone-Acetaminophen] Nausea And Vomiting   Review of Systems  Patient confused.  Physical Exam  Elderly frail female, Awake, orientated to person and place, confused but cooperative CV irreg  Resp mild to mod distress, tachypnea and abdominal breathing. Abdomen soft, nt LE left BKA, right in prevlon boot  Vital Signs: BP (!) 145/55 (BP  Location: Left Arm)   Pulse (!) 54   Temp (!) 96.1 F (35.6 C) (Axillary) Comment (Src): under the left arm  Resp (!) 24   Ht _0  (1.6 m)   Wt 59.4 kg (130 lb 14.4 oz)   SpO2 97%   BMI 23.19 kg/m  Pain Assessment: 0-10   Pain Score: Asleep   SpO2: SpO2: 97 % O2 Device:SpO2: 97 % O2 Flow Rate: .O2 Flow Rate (L/min): 3 L/min  IO: Intake/output summary:  Intake/Output Summary (Last 24 hours) at 01/05/17 0938 Last data filed at 01/05/17 0428  Gross per 24 hour  Intake              400 ml  Output                0 ml  Net              400 ml    LBM: Last BM Date: 01/04/17 (per hubs/per admit) Baseline Weight: Weight: 51.7 kg (114 lb) Most recent weight: Weight: 59.4 kg (130 lb 14.4 oz)     Palliative Assessment/Data:     Time In: 1:00 Time Out: 2:30 Time Total: 90 min. Greater than 50%  of this time was spent counseling and coordinating care related to the above assessment and plan.  Signed by: Imogene Burn, PA-C Palliative Medicine Pager: [phone]  Please contact Palliative Medicine Team phone at [phone] for questions and concerns.  For individual provider: See Shea Evans

## 2017-01-05 NOTE — Progress Notes (Signed)
ANTICOAGULATION CONSULT NOTE - FOLLOW UP  Consult  Pharmacy Consult for warfarin dosing and monitoring  Indication: Atrial fibrillation and Right upper extremity DVT   Allergies  Allergen Reactions  . Azithromycin Other (See Comments)    Upset stomach  . Erythromycin Nausea And Vomiting  . Fentanyl Nausea And Vomiting  . Percocet [Oxycodone-Acetaminophen] Nausea And Vomiting    Patient Measurements: Height: 5\' 3"  (160 cm) Weight: 130 lb 14.4 oz (59.4 kg) IBW/kg (Calculated) : 52.4  Vital Signs: Temp: 96.1 F (35.6 C) (05/16 2046) Temp Source: Axillary (05/16 2046) BP: 145/55 (05/17 0357) Pulse Rate: 54 (05/17 0357)  Labs:  Recent Labs  01/04/17 1339 01/04/17 1954  HGB 10.3*  --   HCT 31.7*  --   PLT 191  --   LABPROT 45.9* 45.9*  INR 4.75* 4.75*  CREATININE 1.23*  --   TROPONINI 0.04*  --     Estimated Creatinine Clearance: 31.7 mL/min (A) (by C-G formula based on SCr of 1.23 mg/dL (H)).   Medical History: Past Medical History:  Diagnosis Date  . Adenomatous polyp   . Anemia   . 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 Atrial fibrillation ([place])    a. on Coumadin. No rate-controlling medications with baseline bradycardia.   . Bradycardia   . CAD (coronary artery disease)    a. s/p 1-vessel CABG to LCx in 2001  . Carotid artery stenosis    mild  . Carotid bruit    left  . Chronic kidney disease    s/p cadaveric renal transplant in 2001  . Colitis   . Compression fracture of L4 lumbar vertebra ([facility])   . Coronary artery disease   . CVA (cerebral infarction)   . Diabetes mellitus   . Diabetic retinopathy   . Esophageal candidiasis ([place])   . Flu 11/22/2016  . Fracture of right maxilla 09/21/2012  . Fx ankle   . Fx wrist   . Gastritis   . GERD (gastroesophageal reflux disease)   . H/O immunosuppressive therapy   . History of orthostatic hypotension   .  Hyperlipidemia   . Hyperparathyroidism   . Hypothyroidism   . IBS (irritable bowel syndrome)   . Ischemic colitis ([place])   . Lactose intolerance   . Leg ulcer ([place])    from poor fitting prosthetic  . Melanoma ([place])   . Nephropathy   . 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 Neuropathy   . Osteoporosis    recurrent fractures  . Pancreatic cyst   . Pneumonia 11/2016  . Pulmonary embolism ([place])   . PVD (peripheral vascular disease) ([place])   . Renal artery stenosis ([place])   . 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 Renal osteodystrophy   . Right orbit fracture ([place]) 09/21/2012  . Stroke ([place])   . TBI (traumatic brain injury) ([place]) 09/30/2012  . Tubular adenoma   . Zygoma fracture ([place]) 09/21/2012    Assessment: 76 yo female admitted with sepsis with PMH of A. Fib and  Right upper extremity DVT. Pharmacy consulted for warfarin dosing and monitoring.   Home Regimen: Warfarin 2.5mg  daily   DATE  INR DOSE  5/16  4.75 HELD  5/17                 4.75     HELD   Goal of Therapy:  INR 2-3 Monitor platelets by  anticoagulation protocol: Yes   Plan:  INR still significantly elevated, will hold warfarin therapy.   Larene Beach, PharmD, BCPS Clinical Pharmacist 01/05/2017 7:50 AM

## 2017-01-05 NOTE — Progress Notes (Signed)
Initial Nutrition Assessment  DOCUMENTATION CODES:   Severe malnutrition in context of chronic illness  INTERVENTION:  Recommend Ensure Enlive po BID between meals, each supplement provides 350 kcal and 20 grams of protein. Patient prefers chocolate or strawberry.  Continue daily multivitamin with minerals. Patient may do better with liquid form - will continue to monitor.  From a nutrition standpoint patient highly unlikely to meet any significant portion of her calorie and protein needs with PO intake. She only takes 2-3 teaspoons of food at a time and usually only able to have one oral nutrition supplement daily. Recommend considering work-up for PEG tube if being in optimal nutrition status would add quality time to patient's life and aligns with patient/family goals of care.  NUTRITION DIAGNOSIS:   Malnutrition (Severe) related to chronic illness (dysphagia, possible achalasia) as evidenced by 12.5 percent weight loss over 2 months, severe depletion of body fat, severe depletion of muscle mass, energy intake < or equal to 75% for > or equal to 1 month.  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   -Patient s/p upper endoscopy on 12/14/2016. Findings of possible achalasia. She was dilated 15 mm (per Dr. Vicente Males enough for stricture, but  would not be enough for achalasia). Pending results of esophageal manometry on 12/26/2016.  -Following SLP evaluation placed on dysphagia 1 diet with thin liquids. -PMT was consulted to discuss goals of care with patient/family. Patient would never want HD again. Family has not decided on whether they would want a PEG tube or not. Limited code (pressors and Bipap okay; no intubation or chest compressions).   Met with patient, her husband, and her daughter at bedside. Patient has had a poor appetite for a few months now. They report it is due her difficulty swallowing and the fact that patient does not like pureed foods. They report in December/January she started having trouble swallowing and regurgitation of solids and liquids that has been ongoing. She also has early satiety after only 2-3 teaspoons of food at meals. She has been at [employer] and husband is there with her during the day and daughter visits in the afternoon. Patient is lactose-intolerant. She has been attempting to drink Glucerna but can usually only have one per day after sipping slowly. Can attempt to drink a second one per family, but unsure if she would finish. Husband reports he purchased her a protein powder that can be mixed into Lactaid for her, but there were complications giving it to her at [facility] because they needed an MD order and an RN to mix it. She has tried Pro-Stat before but it made her vomit immediately afterwards - not wanting to try again. Family very concerned about carbohydrate intake. Discussed that with how little patient is able to eat right now that is not a concern. She needs all of the calories and  protein she can get and should take in what she can tolerate and enjoy.  Family reports that even with her poor intake she has been weight stable so far. Per chart she was 134.5 lbs on 09/30/2016. She lost 16.8 lbs (12.5% body weight) over 2 months, which is significant for time frame (was 117.7 lbs on 12/02/2016).  Question accuracy of current weight - may be falsely elevated with edema/fluid.  Meal Completion: only bites of lunch today  Medications reviewed and include: Vitamin D 2000 units daily, ferrous sulfate 325 mg daily, Novolog sliding scale TID with meals and QHS, levothyroxine, Remeron, multivitamin with minerals daily, potassium chloride 10 mEq daily, prednisone 5 mg daily, tacrolimus 2 mg every morning and 1 mg QHS, cefepime, Zofran PRN.  Labs reviewed: CBG 74-84, Potassium 5.3, Chloride 112, BUN 42, Creatinine 1.12.   Nutrition-Focused physical exam completed. Findings are moderate-severe fat depletion, moderate-severe muscle depletion, and severe edema. Edema expected with hx of CHF. Per husband patient has been wheel-chair bound for 2-3 years so muscle wasting in lower extremities expected.  Discussed with RN. Also discussed with SLP.  Diet Order:  DIET - DYS 1 Room service appropriate? Yes with Assist; Fluid consistency: Thin  Skin:  Wound (see comment) (skin tear to right arm)  Last BM:  01/04/2017  Height:   Ht Readings from Last 1 Encounters:  01/04/17 5' 3" (1.6 m)    Weight:   Wt Readings from Last 1 Encounters:  01/04/17 130 lb 14.4 oz (59.4 kg)    Ideal Body Weight:  50.2 kg  BMI:  Body mass index is 23.19 kg/m.  Estimated Nutritional Needs:   Kcal:  1370-1580 (MSJ x 1.3-1.5)  Protein:  72-84 grams (1.2-1.4 grams/kg)  Fluid:  1.4-1.6 L/day  EDUCATION NEEDS:   No education needs identified at this time  Willey Blade, MS, RD, LDN Pager: [phone] After Hours Pager: [phone]

## 2017-01-05 NOTE — Progress Notes (Signed)
[place] at [place] NAME: Elizabeth [name]    MR#:  [id number]  DATE OF BIRTH:  1941/04/01  SUBJECTIVE:  CHIEF COMPLAINT:   Chief Complaint  Patient presents with  . Abdominal Pain  weak, SOB, not able to eat much REVIEW OF SYSTEMS:  Review of Systems  Constitutional: Positive for malaise/fatigue and weight loss. Negative for chills and fever.  HENT: Negative for nosebleeds and sore throat.   Eyes: Negative for blurred vision.  Respiratory: Positive for shortness of breath. Negative for cough and wheezing.   Cardiovascular: Negative for chest pain, orthopnea, leg swelling and PND.  Gastrointestinal: Negative for abdominal pain, constipation, diarrhea, heartburn, nausea and vomiting.  Genitourinary: Negative for dysuria and urgency.  Musculoskeletal: Negative for back pain.  Skin: Negative for rash.  Neurological: Positive for weakness. Negative for dizziness, speech change, focal weakness and headaches.  Endo/Heme/Allergies: Does not bruise/bleed easily.  Psychiatric/Behavioral: Negative for depression.   DRUG ALLERGIES:   Allergies  Allergen Reactions  . Azithromycin Other (See Comments)    Upset stomach  . Erythromycin Nausea And Vomiting  . Fentanyl Nausea And Vomiting  . Percocet [Oxycodone-Acetaminophen] Nausea And Vomiting   VITALS:  Blood pressure (!) 96/31, pulse 60, temperature 98 F (36.7 C), temperature source Oral, resp. rate (!) 23, height _0  (1.6 m), weight 59.4 kg (130 lb 14.4 oz), SpO2 99 %. PHYSICAL EXAMINATION:  Physical Exam  Constitutional: She is oriented to person, place, and time. She appears malnourished. She appears unhealthy. She appears cachectic. She has a sickly appearance. She appears distressed.  HENT:  Head: Normocephalic and atraumatic.  Eyes: Conjunctivae and EOM are normal. Pupils are equal, round, and reactive to light.  Neck: Normal range of motion. Neck supple. No tracheal deviation  present. No thyromegaly present.  Cardiovascular: Normal rate, regular rhythm and normal heart sounds.   Pulmonary/Chest: Accessory muscle usage present. She is in respiratory distress. She has decreased breath sounds. She has no wheezes. She has rales. She exhibits no tenderness.  Abdominal: Soft. Bowel sounds are normal. She exhibits no distension. There is no tenderness.  Musculoskeletal: Normal range of motion.  Neurological: She is alert and oriented to person, place, and time. No cranial nerve deficit.  Skin: Skin is warm and dry. No rash noted.  Psychiatric: Mood and affect normal.   LABORATORY PANEL:  Female CBC  Recent Labs Lab 01/05/17 0809  WBC 2.7*  HGB 10.3*  HCT 31.5*  PLT 170   ------------------------------------------------------------------------------------------------------------------ Chemistries   Recent Labs Lab 01/04/17 1339 01/05/17 0809  NA 142 143  K 4.7 5.3*  CL 113* 112*  CO2 25 26  GLUCOSE 47* 73  BUN 44* 42*  CREATININE 1.23* 1.12*  CALCIUM 8.1* 8.3*  AST 15  --   ALT 11*  --   ALKPHOS 74  --   BILITOT 0.7  --    RADIOLOGY:  No results found. ASSESSMENT AND PLAN:  76 year old female with past medical history of renal transplant, peripheral vascular disease status post left below the knee amputation, diabetes, hypertension, history of esophageal candidiasis with recent diagnosis of achalasia, GERD, Ron acute atrial fibrillation, coronary artery disease status post bypass who presents to the hospital due to weakness, tachypnea and suspected to have sepsis secondary to pneumonia and urinary tract infection.  1. Sepsis-Present on admission due to pneumonia along with urinalysis positive for urinary tract infection. HYpotension due to same with baseline BP in 90s anyways - Continue IV  antibiotics with vancomycin, cefepime. Await blood, urine, sputum cultures. -BP runs low  2. Pneumonia-chest x-ray findings suggestive of possible superimposed  pneumonia in the lower lung fields. Continue Vanco, Cefepime - await blood, sputum Cx.   3. UTI - continue IV cefepime, follow urine cultures.  4. Altered mental status-metabolic encephalopathy secondary to underlying sepsis and urinary tract infection. CT head is negative for an acute intrarenal pathology. -Continue treatment as mentioned above and follow mental status.  5. Diabetes type 2 without complication-continue sliding scale insulin, follow blood sugars.  6. Hypothyroidism-continue Synthroid.  7. History of renal transplant-continue prednisone, Prograf, mycophenolate.  8. Right upper extremity DVT-secondary to her PICC line the patient recently had. Patient is already on Coumadin but INR is still pending. If INR is subtherapeutic we'll bridge her with Lovenox and continue Coumadin for now.  9. History of chronic atrial fibrillation-rate controlled. -Hold Coumadin, follow INR to goal of between 2 and 3. INR 4.3 Today   Patient is critically sick, high risk for acute cardio-respi arrest, sepsis with multiorgan failure and death.  Had long d/w pt's husband on phone who understands poor prognosis (< 6 months), agreeable to take her to [facility] with palliative care and transition to Hospice.   Please note Palliative care met with family this afternoon and they r agreeable with same.   All the records are reviewed and case discussed with Care Management/Social Worker. Management plans discussed with the patient, family (husband) and they are in agreement.  CODE STATUS: Partial Code  TOTAL TIME (Critical care)TAKING CARE OF THIS PATIENT: 35 minutes.   More than 50% of the time was spent in counseling/coordination of care: YES  POSSIBLE D/C IN 2-3 DAYS, DEPENDING ON CLINICAL CONDITION.   Max Sane M.D on 01/05/2017 at 8:11 PM  Between 7am to 6pm - Pager - [phone]  After 6pm go to [email or web address] - Proofreader  [facility]  Office   [phone]  CC: Primary care physician; Donato Heinz, MD  Note: This dictation was prepared with Dragon dictation along with smaller phrase technology. Any transcriptional errors that result from this process are unintentional.

## 2017-01-05 NOTE — Care Management (Signed)
Patient is from [employer].  CSW aware

## 2017-01-06 ENCOUNTER — Telehealth: Payer: Self-pay

## 2017-01-06 DIAGNOSIS — Z515 Encounter for palliative care: Secondary | ICD-10-CM

## 2017-01-06 DIAGNOSIS — A419 Sepsis, unspecified organism: Principal | ICD-10-CM

## 2017-01-06 DIAGNOSIS — J189 Pneumonia, unspecified organism: Secondary | ICD-10-CM

## 2017-01-06 DIAGNOSIS — Z7189 Other specified counseling: Secondary | ICD-10-CM

## 2017-01-06 LAB — CBC
HEMATOCRIT: 28.6 % — AB (ref 35.0–47.0)
Hemoglobin: 9.4 g/dL — ABNORMAL LOW (ref 12.0–16.0)
MCH: 31.8 pg (ref 26.0–34.0)
MCHC: 32.8 g/dL (ref 32.0–36.0)
MCV: 97 fL (ref 80.0–100.0)
Platelets: 170 10*3/uL (ref 150–440)
RBC: 2.95 MIL/uL — ABNORMAL LOW (ref 3.80–5.20)
RDW: 19.7 % — AB (ref 11.5–14.5)
WBC: 2.4 10*3/uL — ABNORMAL LOW (ref 3.6–11.0)

## 2017-01-06 LAB — BASIC METABOLIC PANEL
Anion gap: 4 — ABNORMAL LOW (ref 5–15)
BUN: 43 mg/dL — AB (ref 6–20)
CALCIUM: 8.1 mg/dL — AB (ref 8.9–10.3)
CO2: 25 mmol/L (ref 22–32)
CREATININE: 1.31 mg/dL — AB (ref 0.44–1.00)
Chloride: 111 mmol/L (ref 101–111)
GFR calc non Af Amer: 38 mL/min — ABNORMAL LOW (ref >60)
GFR, EST AFRICAN AMERICAN: 44 mL/min — AB (ref >60)
Glucose, Bld: 83 mg/dL (ref 65–99)
Potassium: 5.2 mmol/L — ABNORMAL HIGH (ref 3.5–5.1)
SODIUM: 140 mmol/L (ref 135–145)

## 2017-01-06 LAB — GLUCOSE, CAPILLARY
GLUCOSE-CAPILLARY: 137 mg/dL — AB (ref 65–99)
GLUCOSE-CAPILLARY: 135 mg/dL — AB (ref 65–99)
Glucose-Capillary: 101 mg/dL — ABNORMAL HIGH (ref 65–99)
Glucose-Capillary: 135 mg/dL — ABNORMAL HIGH (ref 65–99)

## 2017-01-06 LAB — PROTIME-INR
INR: 3.6
PROTHROMBIN TIME: 36.8 s — AB (ref 11.4–15.2)

## 2017-01-06 MED ORDER — WARFARIN SODIUM 1 MG PO TABS
1.0000 mg | ORAL_TABLET | Freq: Once | ORAL | Status: AC
  Administered 2017-01-06: 1 mg via ORAL
  Filled 2017-01-06: qty 1

## 2017-01-06 NOTE — Progress Notes (Addendum)
[place] at [place] NAME: Elizabeth [name]    MR#:  [id number]  DATE OF BIRTH:  Nov 10, 1940  SUBJECTIVE:  CHIEF COMPLAINT:   Chief Complaint  Patient presents with  . Abdominal Pain  weak, SOB, not able to eat much. Per family, she is able to eat lunch and dinner while family is there.  She usually throws up her breakfast.  She did throw up after her breakfast this morning.  All family at bedside.  Remains hypotensive. Husband's, keep asking me for patient's esophageal manometrically test results which were done as an outpatient on 10th of May by GI REVIEW OF SYSTEMS:  Review of Systems  Constitutional: Positive for malaise/fatigue and weight loss. Negative for chills and fever.  HENT: Negative for nosebleeds and sore throat.   Eyes: Negative for blurred vision.  Respiratory: Positive for shortness of breath. Negative for cough and wheezing.   Cardiovascular: Negative for chest pain, orthopnea, leg swelling and PND.  Gastrointestinal: Positive for nausea and vomiting. Negative for abdominal pain, constipation, diarrhea and heartburn.  Genitourinary: Negative for dysuria and urgency.  Musculoskeletal: Negative for back pain.  Skin: Negative for rash.  Neurological: Positive for weakness. Negative for dizziness, speech change, focal weakness and headaches.  Endo/Heme/Allergies: Does not bruise/bleed easily.  Psychiatric/Behavioral: Negative for depression.   DRUG ALLERGIES:   Allergies  Allergen Reactions  . Azithromycin Other (See Comments)    Upset stomach  . Erythromycin Nausea And Vomiting  . Fentanyl Nausea And Vomiting  . Percocet [Oxycodone-Acetaminophen] Nausea And Vomiting   VITALS:  Blood pressure (!) 99/41, pulse (!) 56, temperature 98.2 F (36.8 C), temperature source Oral, resp. rate 20, height 5\' 3"  (1.6 m), weight 58.8 kg (129 lb 9.6 oz), SpO2 100 %. PHYSICAL EXAMINATION:  Physical Exam  Constitutional: She is  oriented to person, place, and time. She appears malnourished. She appears unhealthy. She appears cachectic. She has a sickly appearance. She appears distressed.  HENT:  Head: Normocephalic and atraumatic.  Eyes: Conjunctivae and EOM are normal. Pupils are equal, round, and reactive to light.  Neck: Normal range of motion. Neck supple. No tracheal deviation present. No thyromegaly present.  Cardiovascular: Normal rate, regular rhythm and normal heart sounds.   Pulmonary/Chest: Accessory muscle usage present. She is in respiratory distress. She has decreased breath sounds. She has no wheezes. She has rales. She exhibits no tenderness.  Abdominal: Soft. Bowel sounds are normal. She exhibits no distension. There is no tenderness.  Musculoskeletal: Normal range of motion.  Neurological: She is alert and oriented to person, place, and time. No cranial nerve deficit.  Skin: Skin is warm and dry. No rash noted.  Psychiatric: Mood and affect normal.   LABORATORY PANEL:  Female CBC  Recent Labs Lab 01/06/17 0442  WBC 2.4*  HGB 9.4*  HCT 28.6*  PLT 170   ------------------------------------------------------------------------------------------------------------------ Chemistries   Recent Labs Lab 01/04/17 1339  01/06/17 0442  NA 142  < > 140  K 4.7  < > 5.2*  CL 113*  < > 111  CO2 25  < > 25  GLUCOSE 47*  < > 83  BUN 44*  < > 43*  CREATININE 1.23*  < > 1.31*  CALCIUM 8.1*  < > 8.1*  AST 15  --   --   ALT 11*  --   --   ALKPHOS 74  --   --   BILITOT 0.7  --   --   < > =  values in this interval not displayed. RADIOLOGY:  No results found. ASSESSMENT AND PLAN:  76 year old female with past medical history of renal transplant, peripheral vascular disease status post left below the knee amputation, diabetes, hypertension, history of esophageal candidiasis with recent diagnosis of achalasia, GERD, Ron acute atrial fibrillation, coronary artery disease status post bypass who presents to  the hospital due to weakness, tachypnea and suspected to have sepsis secondary to pneumonia and urinary tract infection.  1. Sepsis-Present on admission due to pneumonia along with urinary tract infection.  - Continue IV antibiotics with vancomycin, cefepime. Await blood, urine, sputum cultures. -BP runs low at baseline in 90s  2. Pneumonia-chest x-ray findings suggestive of possible superimposed pneumonia in the lower lung fields. Continue Vanco, Cefepime - await blood, sputum Cx.   3. UTI - continue IV cefepime, urine cultures growing multiple species.  Can finish course of antibiotics for total of 5 days  4. Altered mental status-metabolic encephalopathy secondary to underlying sepsis and urinary tract infection. CT head is negative for an acute intrarenal pathology. -Continue treatment as mentioned above and follow mental status.  5. Diabetes type 2 without complication-continue sliding scale insulin, follow blood sugars.  6. Hypothyroidism-continue Synthroid.  7. History of renal transplant-continue prednisone, Prograf, mycophenolate.  8. Right upper extremity DVT-secondary to her PICC line the patient recently had.  Coumadin per pharmacy  9. History of chronic atrial fibrillation-rate controlled. -Hold Coumadin, follow INR to goal of between 2 and 3. INR 3.6 Today  Regarding patient's dysphagia and esophageal manometry results.  I have discussed this with Dr. Allen Norris who will look into it further   Had long d/w pt's husband, other family members at bedside, they are agreeable to take her to [facility] with palliative care and transition to Hospice.  If her clinical condition worsens over the weekend, she may end up going to hospice home.  Family would like to meet with palliative care again today to go over how it will look at rehabilitation with palliative   All the records are reviewed and case discussed with Care Management/Social Worker. Management plans discussed with the  patient, family (husband) and they are in agreement.  CODE STATUS: Partial Code  TOTAL TIME TAKING CARE OF THIS PATIENT: 35 minutes.   More than 50% of the time was spent in counseling/coordination of care: YES  POSSIBLE D/C IN 3-4 DAYS, DEPENDING ON CLINICAL CONDITION.   Max Sane M.D on 01/06/2017 at 2:38 PM  Between 7am to 6pm - Pager - [phone]  After 6pm go to [email or web address] - Proofreader  [facility]  Office  [phone]  CC: Primary care physician; Donato Heinz, MD  Note: This dictation was prepared with Dragon dictation along with smaller phrase technology. Any transcriptional errors that result from this process are unintentional.

## 2017-01-06 NOTE — Evaluation (Signed)
Physical Therapy Evaluation Patient Details Name: Elizabeth [name] MRN: [id number] DOB: [birth year] Today's Date: 01/06/2017   History of Present Illness  76 y.o. female with a known history of Atrial fibrillation, coronary artery disease status post bypass, diabetes, history of previous CVA, recent diagnosis was suspected achalasia, carotid artery disease, esophageal candidiasis, history of renal transplant, peripheral vascular disease status post left below the knee amputation who presents to the hospital due to tachypnea, weakness.  She was here last month and discharged to rehab facility, where she is now admitted from.  Clinical Impression  Pt very limited with what she is able to do/tolerate during PT exam.  She was willing to try to get up despite not having brace/prosthetic but was unable to even maintain sitting EOB, much less attempt transfer/getting to recliner. Pt reports that prior to recent string of hospital admits she was at home with husband and able to do most of what she needed, negotiate steps, etc - though given her performance today this seems questionable.  Pt is very functionally limited today and will need return to rehab on discharge from the hospital.     Follow Up Recommendations SNF    Equipment Recommendations  None recommended by PT    Recommendations for Other Services       Precautions / Restrictions Precautions Precautions: Fall Required Braces or Orthoses:  (prosthetic (for L BKA) not in room) Restrictions Weight Bearing Restrictions: No RLE Weight Bearing: Non weight bearing LLE Weight Bearing: Non weight bearing      Mobility  Bed Mobility Overal bed mobility: Needs Assistance Bed Mobility: Supine to Sit;Sit to Supine     Supine to sit: Mod assist Sit to supine: Max assist   General bed mobility comments: Pt showed good effort in getting to EOB, ultimately needed assist to assume sitting position, after this she was in too much pain to try  to maintain sitting and needed a lot of assist to lay back down  Transfers                 General transfer comment: no tested, pt w/o L prosthetic and R KAFO.  Unable to sit at EOB secondary to pain.  Ambulation/Gait                Stairs            Wheelchair Mobility    Modified Rankin (Stroke Patients Only)       Balance Overall balance assessment: Needs assistance Sitting-balance support: Bilateral upper extremity supported Sitting balance-Leahy Scale: Poor Sitting balance - Comments: unable to maintain sitting more than a few seconds before back pain became severe and she laid herself back down.                                     Pertinent Vitals/Pain Pain Assessment:  (no pain at rest, severe low back pain sitting EOB) Pain Score:  (enough to make her lay back and report she can't do anything)    Home Living Family/patient expects to be discharged to:: Skilled nursing facility Living Arrangements:  (home with spouse prior to "recent" rehab facility admit) Available Help at Discharge: Family;Available 24 hours/day Type of Home: House Home Access: Stairs to enter Entrance Stairs-Rails: None Entrance Stairs-Number of Steps: 1 Home Layout: Two level;Able to live on main level with bedroom/bathroom Home Equipment: Gilford Rile - 2 wheels;Cane - single point;Shower seat;Grab  bars - tub/shower;Grab bars - toilet;Wheelchair - manual      Prior Function Level of Independence: Needs assistance   Gait / Transfers Assistance Needed: Pt has been ambulating household distances only due to weakness since flu in early April.  Pt ambulates with her L KAFP and R prosthesis.  Pt uses WC in community and her husband pushes her.  Pt denies any falls in the past 6 months.       Comments: husband assists with some ADLs but patient reports independence with mobility ocassional use of device if needed     Hand Dominance        Extremity/Trunk  Assessment   Upper Extremity Assessment Upper Extremity Assessment: Generalized weakness    Lower Extremity Assessment Lower Extremity Assessment: RLE deficits/detail;LLE deficits/detail RLE Deficits / Details: Strength grossly 3+/5 RLE LLE Deficits / Details: H/o L BKA, strength grossly 3+/5       Communication   Communication: No difficulties  Cognition Arousal/Alertness: Awake/alert Behavior During Therapy: WFL for tasks assessed/performed Overall Cognitive Status: Difficult to assess                                        General Comments      Exercises     Assessment/Plan    PT Assessment Patient needs continued PT services  PT Problem List Decreased strength;Decreased activity tolerance;Decreased balance;Decreased mobility;Decreased safety awareness;Decreased knowledge of use of DME;Pain;Cardiopulmonary status limiting activity       PT Treatment Interventions DME instruction;Gait training;Functional mobility training;Therapeutic activities;Stair training;Therapeutic exercise;Balance training;Neuromuscular re-education;Patient/family education;Wheelchair mobility training    PT Goals (Current goals can be found in the Care Plan section)  Acute Rehab PT Goals Patient Stated Goal: get back to her normal PT Goal Formulation: With patient Time For Goal Achievement: 01/19/17 Potential to Achieve Goals: Fair    Frequency Min 2X/week   Barriers to discharge        Co-evaluation               AM-PAC PT "6 Clicks" Daily Activity  Outcome Measure Difficulty turning over in bed (including adjusting bedclothes, sheets and blankets)?: Total Difficulty moving from lying on back to sitting on the side of the bed? : Total Difficulty sitting down on and standing up from a chair with arms (e.g., wheelchair, bedside commode, etc,.)?: Total Help needed moving to and from a bed to chair (including a wheelchair)?: Total Help needed walking in hospital  room?: Total Help needed climbing 3-5 steps with a railing? : Total 6 Click Score: 6    End of Session Equipment Utilized During Treatment: Oxygen (3 liters) Activity Tolerance: Patient limited by fatigue;Patient limited by pain Patient left: with bed alarm set;with call bell/phone within reach   PT Visit Diagnosis: Muscle weakness (generalized) (M62.81);Unsteadiness on feet (R26.81)    Time: 2725-3664 PT Time Calculation (min) (ACUTE ONLY): 18 min   Charges:   PT Evaluation $PT Eval Low Complexity: 1 Procedure     PT G Codes:        Kreg Shropshire, DPT 01/06/2017, 12:22 PM

## 2017-01-06 NOTE — Clinical Social Work Note (Signed)
Clinical Social Work Assessment  Patient Details  Name: Elizabeth [name] MRN: [id number] Date of Birth: December 18, 1940  Date of referral:  01/06/17               Reason for consult:  Facility Placement                Permission sought to share information with:  Family Supports, Customer service manager Permission granted to share information::  Yes, Verbal Permission Granted  Name::     Corredor,[name] [phone]  [phone] or Russell,Heather Daughter [phone]  [phone]   Agency::  SNF admissions  Relationship::     Contact Information:     Housing/Transportation Living arrangements for the past 2 months:  [place] of Information:  Patient, Spouse Patient Interpreter Needed:  None Criminal Activity/Legal Involvement Pertinent to Current Situation/Hospitalization:  No - Comment as needed Significant Relationships:  Adult Children, Spouse Lives with:  Facility Resident Do you feel safe going back to the place where you live?  Yes Need for family participation in patient care:  No (Coment)  Care giving concerns: Patient was receiving therapy at SNF and wants to continue at [facility].   Social Worker assessment / plan:  Patient is a 76 year old female who is a short term rehab patient at [facility].  Patient is alert and oriented x4 and able to express her feelings.  Patient states that she does not have any issues with the SNF that she is at.  Patient is aware of how insurance will pay for her stay and what to expect.  Patient did not express any other questions or concerns.  Employment status:  Retired Forensic scientist:  [employer] PT Recommendations:  [place] / Referral to community resources:  [place]  Patient/Family's Response to care:  Patient is in agreement to returning to SNF  Patient/Family's Understanding of and Emotional Response to Diagnosis, Current Treatment, and  Prognosis:  Patient expressed that she is hopeful that she will be ready to return home soon after getting some therapy.  Emotional Assessment Appearance:  Appears stated age Attitude/Demeanor/Rapport:    Affect (typically observed):  Appropriate, Calm Orientation:  Oriented to Place, Oriented to Self, Oriented to  Time, Oriented to Situation Alcohol / Substance use:  Not Applicable Psych involvement (Current and /or in the community):  Yes (Comment)  Discharge Needs  Concerns to be addressed:  Lack of Support Readmission within the last 30 days:  Yes (12-16-16 to [facility]) Current discharge risk:  None Barriers to Discharge:  Continued Medical Work up   Ross Ludwig, [place] 01/06/2017, 10:20 AM

## 2017-01-06 NOTE — Progress Notes (Signed)
Daily Progress Note   Patient Name: Elizabeth [name]       Date: 01/06/2017 DOB: August 25, 1940  Age: 76 y.o. MRN#: [id number] Attending Physician: Max Sane, MD Primary Care Physician: Donato Heinz, MD Admit Date: 01/04/2017  Reason for Consultation/Follow-up: Establishing goals of care  Subjective: Patient awake, alert, pleasantly confused. Asking for "real food" that is not pureed. Explained risk for aspiration and need to follow speech therapy instructions. She starts crying and asking for spaghetti. I spoke with SLP, Belenda Cruise, and she agrees to allow the patient to eat minced spaghetti. Note reviewed.   Spoke with husband Designer, television/film set) and daughter Nira Conn) in family waiting room. Discussed diagnoses, interventions, and multiple underlying co-morbidities. They speak of the patients decline since strokes, starting 6 years ago, but especially in the last few months. The patient told them multiple times last night that she "is tired." They inquire about palliative versus hospice services on discharge. Educated on the difference including the transition to a comfort approach with hospice services, with focus on comfort, quality, dignity, and preventing rehospitalization.   [name] speaks of wanting her to be able to "stand and pivot" before returning home. She has not been able to do this for one month. Expressed my concern that she may not reach this goal but if requested, they can still have hospice services at home on discharge if she cannot ambulate. Heather and [name] feel she may need hospice on discharge but "want to see how the weekend goes." She has had multiple hospital admissions and they seem realistic that she will continue to decline.   Answered all questions and concerns. Provided emotional  and spiritual support.    Length of Stay: 2  Current Medications: Scheduled Meds:  . atorvastatin  10 mg Oral QODAY  . cholecalciferol  2,000 Units Oral Daily  . escitalopram  10 mg Oral Daily  . feeding supplement (ENSURE ENLIVE)  237 mL Oral BID BM  . ferrous sulfate  325 mg Oral Daily  . insulin aspart  0-5 Units Subcutaneous QHS  . insulin aspart  0-9 Units Subcutaneous TID WC  . levothyroxine  112 mcg Oral QAC breakfast  . mirtazapine  7.5 mg Oral QHS  . multivitamin with minerals  1 tablet Oral Daily  . mycophenolate  360 mg Oral BID  . potassium chloride  10 mEq Oral Daily  . predniSONE  5 mg Oral q morning - 10a  . tacrolimus  1 mg Oral QHS  . tacrolimus  2 mg Oral q morning - 10a  . warfarin  1 mg Oral ONCE-1800  . Warfarin - Pharmacist Dosing Inpatient   Does not apply q1800    Continuous Infusions: . ceFEPime (MAXIPIME) IV Stopped (01/06/17 1044)    PRN Meds: acetaminophen **OR** acetaminophen, docusate sodium, HYDROcodone-acetaminophen, ondansetron **OR** ondansetron (ZOFRAN) IV, promethazine  Physical Exam  Constitutional: She appears cachectic. She is cooperative. She appears ill.  HENT:  Head: Normocephalic and atraumatic.  Cardiovascular: Regular rhythm.   Pulmonary/Chest: Effort normal. She has decreased breath sounds.  Abdominal: Normal appearance.  Neurological: She is alert.  Skin: Skin is warm and dry. There is pallor.  Psychiatric: Her speech is delayed. Cognition and memory are impaired. She is inattentive.  Nursing note and vitals reviewed.          Vital Signs: BP (!) 99/41 (BP Location: Left Arm)   Pulse (!) 56   Temp 98.2 F (36.8 C) (Oral)   Resp 20   Ht 5\' 3"  (1.6 m)   Wt 58.8 kg (129 lb 9.6 oz)   SpO2 100%   BMI 22.96 kg/m  SpO2: SpO2: 100 % O2 Device: O2 Device: Not Delivered O2 Flow Rate: O2 Flow Rate (L/min): 3 L/min  Intake/output summary:  Intake/Output Summary (Last 24 hours) at 01/06/17 1518 Last data filed at 01/06/17  1200  Gross per 24 hour  Intake              300 ml  Output                0 ml  Net              300 ml   LBM: Last BM Date: 01/06/17 Baseline Weight: Weight: 51.7 kg (114 lb) Most recent weight: Weight: 58.8 kg (129 lb 9.6 oz)       Palliative Assessment/Data: PPS 30%   Flowsheet Rows     Most Recent Value  Intake Tab  Referral Department  Hospitalist  Unit at Time of Referral  Cardiac/Telemetry Unit  Palliative Care Primary Diagnosis  Other (Comment)  Date Notified  01/04/17  Palliative Care Type  New Palliative care  Reason for referral  Clarify Goals of Care  Date of Admission  01/04/17  Date first seen by Palliative Care  01/05/17  # of days Palliative referral response time  1 Day(s)  # of days IP prior to Palliative referral  0  Clinical Assessment  Palliative Performance Scale Score  30%  Psychosocial & Spiritual Assessment  Palliative Care Outcomes  Patient/Family meeting held?  Yes  Who was at the meeting?  patient, husband, dtr  Palliative Care Outcomes  Clarified goals of care      Patient Active Problem List   Diagnosis Date Noted  . Acute on chronic diastolic heart failure ([place])   . Palliative care encounter   . Goals of care, counseling/discussion   . Sepsis ([place]) 01/04/2017  . Atrial fibrillation with rapid ventricular response ([place])   . AKI (acute kidney injury) ([place]) 12/07/2016  . Kidney transplant recipient   . Herpes simplex   . Failure to thrive (child)   . Hx of BKA, left ([place])   . Acute on chronic respiratory failure ([place]) 11/30/2016  . Hypoxia   . CHF (congestive heart failure) ([place])   . S/P thoracentesis   .  Influenza with pneumonia   . CKD (chronic kidney disease) stage 4, GFR 15-29 ml/min ([facility])   . Chronic anticoagulation   . Community acquired pneumonia of right lower lobe of lung ([place])   . Febrile illness 11/22/2016  . Pleural effusion, right 09/30/2016  . Acquired hypothyroidism 09/01/2014  . Hyperlipidemia 06/18/2014  .  Altered mental status 11/19/2013  . Acute encephalopathy 11/19/2013  . Chronic diastolic heart failure ([place]) 10/21/2013  . History of renal transplant 10/21/2013  . Encounter for therapeutic drug monitoring 09/17/2013  . Chronic cystitis 05/13/2013  . UTI (lower urinary tract infection) 09/27/2012  . Concussion, unspecified 09/21/2012  . 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 Orthostatic hypotension 06/06/2012  . 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 Constipation 10/11/2011  . Nocturnal polyuria 03/31/2011  . Paroxysmal atrial fibrillation ([place]) 01/28/2011  . Encounter for anticoagulation discussion and counseling 01/28/2011  . CVA (cerebral vascular accident) ([place]) 01/28/2011  . DVT of upper extremity (deep vein thrombosis) ([place]) 01/28/2011  . Edema 01/10/2011  . SINUS BRADYCARDIA 12/14/2009  . GERD 12/08/2009  . DIZZINESS 05/20/2009  . Type 2 diabetes mellitus with stage 4 chronic kidney disease, without long-term current use of insulin ([place]) 02/11/2009  . HTN (hypertension), benign 02/09/2009  . Coronary artery disease involving native coronary artery of native heart without angina pectoris 02/09/2009  . CAROTID BRUITS, BILATERAL 02/09/2009  . 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 Irritable bowel syndrome 08/18/2008  . COLONIC POLYPS, HX OF 08/18/2008    Palliative Care Assessment & Plan   Patient Profile: 76 y.o. female  with past medical history of multiple CVAs, DM, renal transplant on immunosuppressive therapy, recurrent UTIs, CAD s/p CABG, PVD s/p LBKA, Afib, and diastolic CHF, chronic pleural effusion s/p multiple thoracentesis, and orthostatic hypotension  who was admitted on 01/04/2017 with weakness and tachypnea.  Initial work up reveals likely CHF exacerbation with possible PNA and UTI.  This is her 4th hospitalization in the last 6 weeks. Of note the patient had an echo cardiogram at [facility] in  February 2018.  Her LVEF was 55%, she had moderate mitral stenosis and mitral regurg, her right ventricular pressure was high (approx 55%).  Assessment: Sepsis Pneumonia UTI Metabolic encephalopathy Hx of renal transplant Hx of multiple CVA's Chronic atrial fibrillation Dysphagia  Recommendations/Plan:  Partial code-NO resuscitation or life support but YES to non-invasive ventilation and ACLS medications.   Watchful waiting through the weekend. If patient continues to decline, family may be ready for hospice services on discharge--home versus inpatient hospice facility.   PMT not at [facility] over the weekend but will f/u Monday, 5/21.   Goals of Care and Additional Recommendations:  Limitations on Scope of Treatment: Full Scope Treatment  Code Status: Partial code-no resuscitation/intubation. Use BiPAP/meds   Code Status Orders        Start     Ordered   01/05/17 1435  Limited resuscitation (code)  Continuous    Question Answer Comment  In the event of cardiac or respiratory ARREST: Initiate Code Blue, Call Rapid Response Yes   In the event of cardiac or respiratory ARREST: Perform CPR No   In the event of cardiac or respiratory ARREST: Perform Intubation/Mechanical Ventilation No   In the event of cardiac or respiratory ARREST: Use NIPPV/BiPAp only if indicated Yes   In the event of cardiac or respiratory ARREST: Administer ACLS medications if indicated Yes   In the event of cardiac or respiratory ARREST: Perform Defibrillation  or Cardioversion if indicated No      01/05/17 1435    Code Status History    Date Active Date Inactive Code Status Order ID Comments User Context   01/04/2017  9:21 PM 01/05/2017  2:35 PM DNR [id number]  Herma Carson, RN Inpatient   01/04/2017  7:45 PM 01/04/2017  9:21 PM Full Code [id number]  Henreitta Leber, MD Inpatient   12/07/2016  9:03 PM 12/16/2016  9:25 PM Full Code [id number]  Hillary Bow, MD ED   11/22/2016  1:20 PM 12/02/2016 10:17 PM DNR  [id number]  Zada Finders, MD ED   11/19/2013  3:15 PM 11/23/2013  8:52 PM Full Code [id number]  Timmothy Euler, MD Inpatient   01/27/2012 10:48 PM 02/01/2012  5:42 PM Partial Code [id number]  Valda Lamb, RN Inpatient    Advance Directive Documentation     Most Recent Value  Type of Advance Directive  Healthcare Power of Attorney, Living will  Pre-existing out of facility DNR order (yellow form or pink MOST form)  -  "MOST" Form in Place?  -       Prognosis:   Unable to determine  Discharge Planning:  To Be Determined  Care plan was discussed with patient, husband, daughter, SLP, RN, and Dr. Manuella Ghazi  Thank you for allowing the Palliative Medicine Team to assist in the care of this patient.   Time In: 1430 Time Out: 1515 Total Time 45min Prolonged Time Billed no       Greater than 50%  of this time was spent counseling and coordinating care related to the above assessment and plan.  Ihor Dow, FNP-C Palliative Medicine Team  Phone: [phone] Fax: [phone]  Please contact Palliative Medicine Team phone at [phone] for questions and concerns.

## 2017-01-06 NOTE — Telephone Encounter (Signed)
Pt's spouse [name] called for manometry results.   Unable to locate results. Routed msg to Ginger for follow-up.  LVM advising patient that we will callback when results have been read by Dr. Allen Norris.

## 2017-01-06 NOTE — Care Management Important Message (Signed)
Important Message  Patient Details  Name: Elizabeth [name] MRN: [id number] Date of Birth: 1941-01-18   Medicare Important Message Given:  Yes  Signed IM notice given      Katrina Stack, RN 01/06/2017, 12:36 PM

## 2017-01-06 NOTE — Progress Notes (Signed)
Speech Therapy Note: ST services was consulted by Palliative Care NP for possible upgrade of a food consistency in pt's diet - pt was requesting spaghetti. As pt appears to be fairly adequate for bolus management of po's(including purees), pt may be appropriate for well-minced spaghetti. According to pt's husband and recent GI assessments, pt has significant Esophageal dysmotility w/ regurgitation - this can increase risk for aspiration to occur during such episode. Due to pt's preference and request and Palliative Care indicating appropriateness to attempt to meet pt's requests d/t declining status overall, well-minced spaghetti put on pt's diet for PRN. ST services will f/u tomorrow for toleration of such. Palliative Care indicated possible new POC goals at first of week. NSG updated.   Orinda Kenner, [place], CCC-SLP

## 2017-01-06 NOTE — NC FL2 (Signed)
[place] LEVEL OF CARE SCREENING TOOL     IDENTIFICATION  Patient Name: Elizabeth [name] Birthdate: 08/14/1941 Sex: female Admission Date (Current Location): 01/04/2017  [facility] and [place] Number:  Engineering geologist and Address:         Provider Number: [id number]  Attending Physician Name and Address:  Max Sane, MD  Relative Name and Phone Number:       Current Level of Care: Hospital Recommended Level of Care: [place] Prior Approval Number:    Date Approved/Denied:   PASRR Number:    Discharge Plan: SNF    Current Diagnoses: Patient Active Problem List   Diagnosis Date Noted  . Acute on chronic diastolic heart failure ([place])   . Palliative care encounter   . Goals of care, counseling/discussion   . Sepsis ([place]) 01/04/2017  . Atrial fibrillation with rapid ventricular response ([place])   . AKI (acute kidney injury) ([place]) 12/07/2016  . Kidney transplant recipient   . Herpes simplex   . Failure to thrive (child)   . Hx of BKA, left ([place])   . Acute on chronic respiratory failure ([place]) 11/30/2016  . Hypoxia   . CHF (congestive heart failure) ([place])   . S/P thoracentesis   . Influenza with pneumonia   . CKD (chronic kidney disease) stage 4, GFR 15-29 ml/min (HCC)   . Chronic anticoagulation   . Community acquired pneumonia of right lower lobe of lung ([place])   . Febrile illness 11/22/2016  . Pleural effusion, right 09/30/2016  . Acquired hypothyroidism 09/01/2014  . Hyperlipidemia 06/18/2014  . Altered mental status 11/19/2013  . Acute encephalopathy 11/19/2013  . Chronic diastolic heart failure ([place]) 10/21/2013  . History of renal transplant 10/21/2013  . Encounter for therapeutic drug monitoring 09/17/2013  . Chronic cystitis 05/13/2013  . UTI (lower urinary tract infection) 09/27/2012  . Concussion, unspecified 09/21/2012  . 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 Orthostatic hypotension 06/06/2012  . 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 Constipation 10/11/2011  . Nocturnal polyuria 03/31/2011  . Paroxysmal atrial fibrillation ([place]) 01/28/2011  . Encounter for anticoagulation discussion and counseling 01/28/2011  . CVA (cerebral vascular accident) ([place]) 01/28/2011  . DVT of upper extremity (deep vein thrombosis) ([place]) 01/28/2011  . Edema 01/10/2011  . SINUS BRADYCARDIA 12/14/2009  . GERD 12/08/2009  . DIZZINESS 05/20/2009  . Type 2 diabetes mellitus with stage 4 chronic kidney disease, without long-term current use of insulin ([place]) 02/11/2009  . HTN (hypertension), benign 02/09/2009  . Coronary artery disease involving native coronary artery of native heart without angina pectoris 02/09/2009  . CAROTID BRUITS, BILATERAL 02/09/2009  . 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 Irritable bowel syndrome 08/18/2008  . COLONIC POLYPS, HX OF 08/18/2008    Orientation RESPIRATION BLADDER Height & Weight     Self  Normal Incontinent Weight: 130 lb 14.4 oz (59.4 kg) Height:  5\' 3"  (160 cm)  BEHAVIORAL SYMPTOMS/MOOD NEUROLOGICAL BOWEL NUTRITION STATUS   (none)  (none) Incontinent Diet (dysphagia 1)  AMBULATORY STATUS COMMUNICATION OF NEEDS Skin   Total Care Verbally PU Stage and Appropriate Care                       Personal Care Assistance Level of Assistance  Total care Bathing Assistance: Maximum assistance Feeding assistance: Maximum assistance Dressing Assistance: Maximum assistance Total Care Assistance: Maximum assistance   Functional Limitations Info  SPECIAL CARE FACTORS FREQUENCY                       Contractures Contractures Info: Not present    Additional Factors Info  Code Status, Allergies Code Status Info: partial Allergies Info: azithromycin; erythrimycin; percocet; fentanyl           Current Medications (01/06/2017):  This is the current hospital active medication  list Current Facility-Administered Medications  Medication Dose Route Frequency Provider Last Rate Last Dose  . acetaminophen (TYLENOL) tablet 650 mg  650 mg Oral Q6H PRN Henreitta Leber, MD       Or  . acetaminophen (TYLENOL) suppository 650 mg  650 mg Rectal Q6H PRN Henreitta Leber, MD      . atorvastatin (LIPITOR) tablet 10 mg  10 mg Oral QODAY Sainani, Belia Heman, MD      . ceFEPIme (MAXIPIME) 2 g in dextrose 5 % 50 mL IVPB  2 g Intravenous Q24H Hallaji, Dani Gobble, RPH   Stopped at 01/05/17 1051  . cholecalciferol (VITAMIN D) tablet 2,000 Units  2,000 Units Oral Daily Henreitta Leber, MD   2,000 Units at 01/05/17 1031  . docusate sodium (COLACE) capsule 100 mg  100 mg Oral Daily PRN Henreitta Leber, MD      . escitalopram (LEXAPRO) tablet 10 mg  10 mg Oral Daily Henreitta Leber, MD   10 mg at 01/05/17 1031  . feeding supplement (ENSURE ENLIVE) (ENSURE ENLIVE) liquid 237 mL  237 mL Oral BID BM Manuella Ghazi, Vipul, MD   237 mL at 01/05/17 1600  . ferrous sulfate tablet 325 mg  325 mg Oral Daily Henreitta Leber, MD   325 mg at 01/05/17 1031  . HYDROcodone-acetaminophen (NORCO/VICODIN) 5-325 MG per tablet 1-2 tablet  1-2 tablet Oral Q6H PRN Sainani, Belia Heman, MD      . insulin aspart (novoLOG) injection 0-5 Units  0-5 Units Subcutaneous QHS Sainani, Vivek J, MD      . insulin aspart (novoLOG) injection 0-9 Units  0-9 Units Subcutaneous TID WC Henreitta Leber, MD   2 Units at 01/05/17 1752  . levothyroxine (SYNTHROID, LEVOTHROID) tablet 112 mcg  112 mcg Oral QAC breakfast Henreitta Leber, MD   112 mcg at 01/05/17 1012  . mirtazapine (REMERON) tablet 7.5 mg  7.5 mg Oral QHS Henreitta Leber, MD   7.5 mg at 01/05/17 2132  . multivitamin with minerals tablet 1 tablet  1 tablet Oral Daily Henreitta Leber, MD   1 tablet at 01/05/17 1031  . mycophenolate (MYFORTIC) EC tablet 360 mg  360 mg Oral BID Henreitta Leber, MD   360 mg at 01/05/17 2132  . ondansetron (ZOFRAN) tablet 4 mg  4 mg Oral Q6H PRN Henreitta Leber, MD       Or  . ondansetron (ZOFRAN) injection 4 mg  4 mg Intravenous Q6H PRN Henreitta Leber, MD   4 mg at 01/05/17 1522  . potassium chloride (K-DUR,KLOR-CON) CR tablet 10 mEq  10 mEq Oral Daily Sainani, Belia Heman, MD      . predniSONE (DELTASONE) tablet 5 mg  5 mg Oral q morning - 10a Sainani, Belia Heman, MD   5 mg at 01/05/17 1031  . promethazine (PHENERGAN) tablet 25 mg  25 mg Oral Q6H PRN Henreitta Leber, MD      . tacrolimus (PROGRAF) capsule 1 mg  1 mg Oral QHS Henreitta Leber, MD  1 mg at 01/05/17 2133  . tacrolimus (PROGRAF) capsule 2 mg  2 mg Oral q morning - 10a Henreitta Leber, MD   2 mg at 01/05/17 1031  . Warfarin - Pharmacist Dosing Inpatient   Does not apply q1800 Pernell Dupre, [facility]   Stopped at 01/05/17 1800     Discharge Medications: Please see discharge summary for a list of discharge medications.  Relevant Imaging Results:  Relevant Lab Results:   Additional Information    Shela Leff, LCSW

## 2017-01-06 NOTE — Progress Notes (Signed)
ANTICOAGULATION CONSULT NOTE - FOLLOW UP  Consult  Pharmacy Consult for warfarin dosing and monitoring  Indication: Atrial fibrillation and Right upper extremity DVT   Allergies  Allergen Reactions  . Azithromycin Other (See Comments)    Upset stomach  . Erythromycin Nausea And Vomiting  . Fentanyl Nausea And Vomiting  . Percocet [Oxycodone-Acetaminophen] Nausea And Vomiting    Patient Measurements: Height: 5\' 3"  (160 cm) Weight: 129 lb 9.6 oz (58.8 kg) IBW/kg (Calculated) : 52.4  Vital Signs: Temp: 98.2 F (36.8 C) (05/18 0600) Temp Source: Oral (05/18 0600) BP: 99/41 (05/18 0600) Pulse Rate: 56 (05/18 0600)  Labs:  Recent Labs  01/04/17 1339 01/04/17 1954 01/05/17 0809 01/06/17 0442  HGB 10.3*  --  10.3* 9.4*  HCT 31.7*  --  31.5* 28.6*  PLT 191  --  170 170  LABPROT 45.9* 45.9* 42.8* 36.8*  INR 4.75* 4.75* 4.35* 3.60  CREATININE 1.23*  --  1.12* 1.31*  TROPONINI 0.04*  --   --   --     Estimated Creatinine Clearance: 29.8 mL/min (A) (by C-G formula based on SCr of 1.31 mg/dL (H)).   Medical History: Past Medical History:  Diagnosis Date  . Adenomatous polyp   . Anemia   . Arteriosclerosis, mesenteric artery ([place]) 02/27/2012   Duplex US shows >70% stenosis celiac and signs of stenosis of SMA and IMA   . Atrial fibrillation ([place])    a. on Coumadin. No rate-controlling medications with baseline bradycardia.   . Bradycardia   . CAD (coronary artery disease)    a. s/p 1-vessel CABG to LCx in 2001  . Carotid artery stenosis    mild  . Carotid bruit    left  . Chronic kidney disease    s/p cadaveric renal transplant in 2001  . Colitis   . Compression fracture of L4 lumbar vertebra (HCC)   . Coronary artery disease   . CVA (cerebral infarction)   . Diabetes mellitus   . Diabetic retinopathy   . Esophageal candidiasis ([place])   . Flu 11/22/2016  . Fracture of right maxilla 09/21/2012  . Fx ankle   . Fx wrist   . Gastritis   . GERD (gastroesophageal  reflux disease)   . H/O immunosuppressive therapy   . History of orthostatic hypotension   . Hyperlipidemia   . Hyperparathyroidism   . Hypothyroidism   . IBS (irritable bowel syndrome)   . Ischemic colitis ([place])   . Lactose intolerance   . Leg ulcer ([place])    from poor fitting prosthetic  . Melanoma ([place])   . Nephropathy   . Neurogenic pain-esophagus 1/Elizabeth/2014  . Neuropathy   . Osteoporosis    recurrent fractures  . Pancreatic cyst   . Pneumonia 11/2016  . Pulmonary embolism ([place])   . PVD (peripheral vascular disease) ([place])   . Renal artery stenosis ([place])   . 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 Renal osteodystrophy   . Right orbit fracture ([place]) 09/21/2012  . Stroke ([place])   . TBI (traumatic brain injury) ([place]) 09/30/2012  . Tubular adenoma   . Zygoma fracture ([place]) 09/21/2012    Assessment: 76 yo [name] admitted with sepsis with PMH of A. Fib and  Right upper extremity DVT. Pharmacy consulted for warfarin dosing and monitoring.   Home Regimen: Warfarin 2.5mg  daily   DATE  INR DOSE  5/16  4.75 HELD  5/17  4.75     HELD  5/18                 3.6         Goal of Therapy:  INR 2-3 Monitor platelets by anticoagulation protocol: Yes   Plan:  INR still elevated however trending downward. Will give warfarin 1 mg PO x1 dose. Will recheck INR in am.   Larene Beach, PharmD, BCPS Clinical Pharmacist 01/06/2017 8:28 AM

## 2017-01-06 NOTE — Progress Notes (Signed)
Pharmacy Antibiotic Note  Elizabeth [name] is a 76 y.o. female admitted on 01/04/2017 with  pneumonia. Patient had recent admission in April for PNA.     Plan: Continue Cefepime 2 g IV q24 hours.   Height: 5\' 3"  (160 cm) Weight: 129 lb 9.6 oz (58.8 kg) IBW/kg (Calculated) : 52.4  Temp (24hrs), Avg:98.3 F (36.8 C), Min:98 F (36.7 C), Max:98.6 F (37 C)   Recent Labs Lab 01/04/17 1339 01/05/17 0809 01/06/17 0442  WBC 3.0* 2.7* 2.4*  CREATININE 1.23* 1.12* 1.31*  LATICACIDVEN 1.0  --   --     Estimated Creatinine Clearance: 29.8 mL/min (A) (by C-G formula based on SCr of 1.31 mg/dL (H)).    Allergies  Allergen Reactions  . Azithromycin Other (See Comments)    Upset stomach  . Erythromycin Nausea And Vomiting  . Fentanyl Nausea And Vomiting  . Percocet [Oxycodone-Acetaminophen] Nausea And Vomiting    Antimicrobials this admission: 5/16 cefepime >>  5/16 vancomcyin >> 5/17  Dose adjustments this admission:  Microbiology results: 5/16 BCx: sent   4/5  MRSA PCR: negative   Thank you for allowing pharmacy to be a part of this patient's care.  Larene Beach, PharmD, BCPS Clinical Pharmacist 01/06/2017 8:33 AM

## 2017-01-07 LAB — CBC
HCT: 31 % — ABNORMAL LOW (ref 35.0–47.0)
Hemoglobin: 9.8 g/dL — ABNORMAL LOW (ref 12.0–16.0)
MCH: 30 pg (ref 26.0–34.0)
MCHC: 31.6 g/dL — ABNORMAL LOW (ref 32.0–36.0)
MCV: 95.1 fL (ref 80.0–100.0)
PLATELETS: 163 10*3/uL (ref 150–440)
RBC: 3.27 MIL/uL — AB (ref 3.80–5.20)
RDW: 20.2 % — AB (ref 11.5–14.5)
WBC: 1.9 10*3/uL — ABNORMAL LOW (ref 3.6–11.0)

## 2017-01-07 LAB — BASIC METABOLIC PANEL
Anion gap: 10 (ref 5–15)
BUN: 43 mg/dL — AB (ref 6–20)
CALCIUM: 8.4 mg/dL — AB (ref 8.9–10.3)
CO2: 22 mmol/L (ref 22–32)
CREATININE: 1.68 mg/dL — AB (ref 0.44–1.00)
Chloride: 108 mmol/L (ref 101–111)
GFR calc Af Amer: 33 mL/min — ABNORMAL LOW (ref >60)
GFR, EST NON AFRICAN AMERICAN: 28 mL/min — AB (ref >60)
GLUCOSE: 163 mg/dL — AB (ref 65–99)
POTASSIUM: 5.4 mmol/L — AB (ref 3.5–5.1)
Sodium: 140 mmol/L (ref 135–145)

## 2017-01-07 LAB — PROTIME-INR
INR: 2.67
PROTHROMBIN TIME: 29 s — AB (ref 11.4–15.2)

## 2017-01-07 LAB — GLUCOSE, CAPILLARY
GLUCOSE-CAPILLARY: 143 mg/dL — AB (ref 65–99)
GLUCOSE-CAPILLARY: 146 mg/dL — AB (ref 65–99)
GLUCOSE-CAPILLARY: 239 mg/dL — AB (ref 65–99)

## 2017-01-07 NOTE — Progress Notes (Signed)
ANTICOAGULATION CONSULT NOTE - FOLLOW UP  Consult  Pharmacy Consult for warfarin dosing and monitoring  Indication: Atrial fibrillation and Right upper extremity DVT   Allergies  Allergen Reactions  . Azithromycin Other (See Comments)    Upset stomach  . Erythromycin Nausea And Vomiting  . Fentanyl Nausea And Vomiting  . Percocet [Oxycodone-Acetaminophen] Nausea And Vomiting    Patient Measurements: Height: 5\' 3"  (160 cm) Weight: 111 lb (50.3 kg) IBW/kg (Calculated) : 52.4  Vital Signs: Temp: 98.1 F (36.7 C) (05/19 0418) BP: 99/43 (05/19 0418) Pulse Rate: 57 (05/19 0418)  Labs:  Recent Labs  01/04/17 1339 01/04/17 1954 01/05/17 0809 01/06/17 0442  HGB 10.3*  --  10.3* 9.4*  HCT 31.7*  --  31.5* 28.6*  PLT 191  --  170 170  LABPROT 45.9* 45.9* 42.8* 36.8*  INR 4.75* 4.75* 4.35* 3.60  CREATININE 1.23*  --  1.12* 1.31*  TROPONINI 0.04*  --   --   --     Estimated Creatinine Clearance: 28.6 mL/min (A) (by C-G formula based on SCr of 1.31 mg/dL (H)).   Medical History: Past Medical History:  Diagnosis Date  . Adenomatous polyp   . Anemia   . Arteriosclerosis, mesenteric artery ([place]) 02/27/2012   Duplex US shows >70% stenosis celiac and signs of stenosis of SMA and IMA   . Atrial fibrillation ([place])    a. on Coumadin. No rate-controlling medications with baseline bradycardia.   . Bradycardia   . CAD (coronary artery disease)    a. s/p 1-vessel CABG to LCx in 2001  . Carotid artery stenosis    mild  . Carotid bruit    left  . Chronic kidney disease    s/p cadaveric renal transplant in 2001  . Colitis   . Compression fracture of L4 lumbar vertebra (HCC)   . Coronary artery disease   . CVA (cerebral infarction)   . Diabetes mellitus   . Diabetic retinopathy   . Esophageal candidiasis ([place])   . Flu 11/22/2016  . Fracture of right maxilla 09/21/2012  . Fx ankle   . Fx wrist   . Gastritis   . GERD (gastroesophageal reflux disease)   . H/O  immunosuppressive therapy   . History of orthostatic hypotension   . Hyperlipidemia   . Hyperparathyroidism   . Hypothyroidism   . IBS (irritable bowel syndrome)   . Ischemic colitis ([place])   . Lactose intolerance   . Leg ulcer ([place])    from poor fitting prosthetic  . Melanoma ([place])   . Nephropathy   . 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 Neuropathy   . Osteoporosis    recurrent fractures  . Pancreatic cyst   . Pneumonia 11/2016  . Pulmonary embolism ([place])   . PVD (peripheral vascular disease) ([place])   . Renal artery stenosis ([place])   . 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 Renal osteodystrophy   . Right orbit fracture ([place]) 09/21/2012  . Stroke ([place])   . TBI (traumatic brain injury) ([place]) 09/30/2012  . Tubular adenoma   . Zygoma fracture ([place]) 09/21/2012    Assessment: 76 yo female admitted with sepsis with PMH of A. Fib and  Right upper extremity DVT. Pharmacy consulted for warfarin dosing and monitoring.   Home Regimen: Warfarin 2.5mg  daily   DATE  INR DOSE  5/16  4.75 HELD  5/17                 4.75  HELD  5/18                 3.6       1 mg 5/19  refused Goal of Therapy:  INR 2-3 Monitor platelets by anticoagulation protocol: Yes   Plan:  Palliative care is following. Patient is refusing labs. Will recheck INR in AM and hold warfarin tonight. Plan discussed with Dr. Anselm Jungling who is in agreement.   Napoleon Form, PharmD, BCPS Clinical Pharmacist 01/07/2017 11:05 AM

## 2017-01-07 NOTE — Progress Notes (Signed)
[facility] NAME: Elizabeth [name]    MR#:  [id number]  DATE OF BIRTH:  [birth year]  SUBJECTIVE:  CHIEF COMPLAINT:   Chief Complaint  Patient presents with  . Abdominal Pain  weak, SOB, not able to eat much. Per family, she is able to eat lunch and dinner while family is there.  She usually throws up her breakfast.  She did throw up after her breakfast this morning.  All family at bedside.  Remains hypotensive. Husband's, keep asking me for patient's esophageal manometrically test results which were done as an outpatient on 10th of May by GI  Pt refused for labs this morning, alert but confused. REVIEW OF SYSTEMS:  Review of Systems  Constitutional: Positive for malaise/fatigue and weight loss. Negative for chills and fever.  HENT: Negative for nosebleeds and sore throat.   Eyes: Negative for blurred vision.  Respiratory: Positive for shortness of breath. Negative for cough and wheezing.   Cardiovascular: Negative for chest pain, orthopnea, leg swelling and PND.  Gastrointestinal: Positive for nausea and vomiting. Negative for abdominal pain, constipation, diarrhea and heartburn.  Genitourinary: Negative for dysuria and urgency.  Musculoskeletal: Negative for back pain.  Skin: Negative for rash.  Neurological: Positive for weakness. Negative for dizziness, speech change, focal weakness and headaches.  Endo/Heme/Allergies: Does not bruise/bleed easily.  Psychiatric/Behavioral: Negative for depression.   DRUG ALLERGIES:   Allergies  Allergen Reactions  . Azithromycin Other (See Comments)    Upset stomach  . Erythromycin Nausea And Vomiting  . Fentanyl Nausea And Vomiting  . Percocet [Oxycodone-Acetaminophen] Nausea And Vomiting   VITALS:  Blood pressure (!) 116/99, pulse (!) 55, temperature 97.8 F (36.6 C), temperature source Oral, resp. rate 17, height 5\' 3"  (1.6 m), weight 50.3 kg (111 lb), SpO2 98 %. PHYSICAL  EXAMINATION:  Physical Exam  Constitutional: She is oriented to person, place, and time. She appears malnourished. She appears unhealthy. She appears cachectic. She has a sickly appearance. No distress.  HENT:  Head: Normocephalic and atraumatic.  Eyes: Conjunctivae and EOM are normal. Pupils are equal, round, and reactive to light.  Neck: Normal range of motion. Neck supple. No tracheal deviation present. No thyromegaly present.  Cardiovascular: Normal rate, regular rhythm and normal heart sounds.   Pulmonary/Chest: Accessory muscle usage present. No respiratory distress. She has decreased breath sounds. She has no wheezes. She has rales. She exhibits no tenderness.  Abdominal: Soft. Bowel sounds are normal. She exhibits no distension. There is no tenderness.  Musculoskeletal: Normal range of motion.  Neurological: She is alert and oriented to person, place, and time. No cranial nerve deficit.  Skin: Skin is warm and dry. No rash noted.  Psychiatric: Mood and affect normal.   LABORATORY PANEL:  Female CBC  Recent Labs Lab 01/06/17 0442  WBC 2.4*  HGB 9.4*  HCT 28.6*  PLT 170   ------------------------------------------------------------------------------------------------------------------ Chemistries   Recent Labs Lab 01/04/17 1339  01/06/17 0442  NA 142  < > 140  K 4.7  < > 5.2*  CL 113*  < > 111  CO2 25  < > 25  GLUCOSE 47*  < > 83  BUN 44*  < > 43*  CREATININE 1.23*  < > 1.31*  CALCIUM 8.1*  < > 8.1*  AST 15  --   --   ALT 11*  --   --   ALKPHOS 74  --   --   BILITOT 0.7  --   --   < > =  values in this interval not displayed. RADIOLOGY:  No results found. ASSESSMENT AND PLAN:  76 year old female with past medical history of renal transplant, peripheral vascular disease status post left below the knee amputation, diabetes, hypertension, history of esophageal candidiasis with recent diagnosis of achalasia, GERD, Ron acute atrial fibrillation, coronary artery disease  status post bypass who presents to the hospital due to weakness, tachypnea and suspected to have sepsis secondary to pneumonia and urinary tract infection.  1. Sepsis-Present on admission due to pneumonia along with urinary tract infection.  - Continue IV antibiotics with cefepime. Negative blood and urine cultures. -BP runs low at baseline in 90s  2. Pneumonia-chest x-ray findings suggestive of possible superimposed pneumonia in the lower lung fields. Continue Vanco, Cefepime - negative blood cx.    MRSA screen negative.  3. UTI - continue IV cefepime, urine cultures growing multiple species.  Can finish course of antibiotics for total of 5 days  4. Altered mental status-metabolic encephalopathy secondary to underlying sepsis and urinary tract infection. CT head is negative for an acute intrarenal pathology. -Continue treatment as mentioned above and follow mental status.  5. Diabetes type 2 without complication-continue sliding scale insulin, follow blood sugars.  6. Hypothyroidism-continue Synthroid.  7. History of renal transplant-continue prednisone, Prograf, mycophenolate.  8. Right upper extremity DVT-secondary to her PICC line the patient recently had.  Coumadin per pharmacy  9. History of chronic atrial fibrillation-rate controlled. -Hold Coumadin, follow INR to goal of between 2 and 3. INR 3.6 yesterday, refused for labs today.  10. Hyperkalemia   Stop oral replacement.  Regarding patient's dysphagia and esophageal manometry results.  I have discussed this with Dr. Allen Norris who will look into it further   Had long d/w pt's husband, other family members at bedside, they are agreeable to take her to STR with palliative care and transition to Hospice.  If her clinical condition worsens over the weekend, she may end up going to hospice home.  Family would like to meet with palliative care again today to go over how it will look at rehabilitation with palliative   All  the records are reviewed and case discussed with Care Management/Social Worker. Management plans discussed with the patient, family (husband) and they are in agreement.  CODE STATUS: Partial Code  TOTAL TIME TAKING CARE OF THIS PATIENT: 35 minutes.   More than 50% of the time was spent in counseling/coordination of care: YES  POSSIBLE D/C IN 2-3 DAYS, DEPENDING ON CLINICAL CONDITION.   Vaughan Basta M.D on 01/07/2017 at 1:28 PM  Between 7am to 6pm - Pager - [phone]  After 6pm go to [email or web address] - Proofreader  [employer] [facility]  Office  [phone]  CC: Primary care physician; Donato Heinz, MD  Note: This dictation was prepared with Dragon dictation along with smaller phrase technology. Any transcriptional errors that result from this process are unintentional.

## 2017-01-07 NOTE — Progress Notes (Signed)
Pt refusing lab work

## 2017-01-08 ENCOUNTER — Inpatient Hospital Stay: Payer: Medicare Other

## 2017-01-08 LAB — PROTIME-INR
INR: 2.19
PROTHROMBIN TIME: 24.7 s — AB (ref 11.4–15.2)

## 2017-01-08 LAB — GLUCOSE, CAPILLARY
GLUCOSE-CAPILLARY: 128 mg/dL — AB (ref 65–99)
Glucose-Capillary: 95 mg/dL (ref 65–99)
Glucose-Capillary: 146 mg/dL — ABNORMAL HIGH (ref 65–99)
Glucose-Capillary: 131 mg/dL — ABNORMAL HIGH (ref 65–99)

## 2017-01-08 MED ORDER — WARFARIN SODIUM 1 MG PO TABS
1.0000 mg | ORAL_TABLET | Freq: Every day | ORAL | Status: DC
  Filled 2017-01-08: qty 1

## 2017-01-08 MED ORDER — WARFARIN SODIUM 1 MG PO TABS
1.0000 mg | ORAL_TABLET | Freq: Once | ORAL | Status: DC
  Filled 2017-01-08: qty 1

## 2017-01-08 MED ORDER — SODIUM POLYSTYRENE SULFONATE 15 GM/60ML PO SUSP
30.0000 g | Freq: Once | ORAL | Status: DC
  Filled 2017-01-08: qty 120

## 2017-01-08 MED ORDER — MORPHINE SULFATE (PF) 2 MG/ML IV SOLN
1.0000 mg | INTRAVENOUS | Status: DC | PRN
  Administered 2017-01-08 – 2017-01-10 (×5): 1 mg via INTRAVENOUS
  Filled 2017-01-08 (×5): qty 1

## 2017-01-08 MED ORDER — SODIUM CHLORIDE 0.9 % IV SOLN
INTRAVENOUS | Status: DC
  Administered 2017-01-08 – 2017-01-10 (×4): via INTRAVENOUS

## 2017-01-08 NOTE — Progress Notes (Signed)
[facility] NAME: Elizabeth [name]    MR#:  [id number]  DATE OF BIRTH:  07-19-1941  SUBJECTIVE:  CHIEF COMPLAINT:   Chief Complaint  Patient presents with  . Abdominal Pain  weak, SOB, not able to eat much. Per family, she is able to eat lunch and dinner while family is there.  She usually throws up her breakfast.  She did throw up after her breakfast this morning.  All family at bedside.  Remains hypotensive. Husband's, keep asking me for patient's esophageal manometrically test results which were done as an outpatient on 10th of May by GI Today pt is more lethargic and moaning with pain. REVIEW OF SYSTEMS:  Review of Systems  Unable to perform ROS: Mental status change   DRUG ALLERGIES:   Allergies  Allergen Reactions  . Azithromycin Other (See Comments)    Upset stomach  . Erythromycin Nausea And Vomiting  . Fentanyl Nausea And Vomiting  . Percocet [Oxycodone-Acetaminophen] Nausea And Vomiting   VITALS:  Blood pressure 106/64, pulse 62, temperature 97 F (36.1 C), temperature source Oral, resp. rate 17, height 5\' 3"  (1.6 m), weight 49.4 kg (109 lb), SpO2 98 %. PHYSICAL EXAMINATION:  Physical Exam  Constitutional: She appears malnourished. She appears unhealthy. She appears cachectic. She has a sickly appearance. No distress.  HENT:  Head: Normocephalic and atraumatic.  Eyes: Conjunctivae and EOM are normal. Pupils are equal, round, and reactive to light.  Neck: Normal range of motion. Neck supple. No tracheal deviation present. No thyromegaly present.  Cardiovascular: Normal rate, regular rhythm and normal heart sounds.   Pulmonary/Chest: Accessory muscle usage present. No respiratory distress. She has decreased breath sounds. She has no wheezes. She has rales. She exhibits no tenderness.  Abdominal: Soft. Bowel sounds are normal. She exhibits no distension. There is no tenderness.  Musculoskeletal: Normal range of  motion.  Neurological: She is alert. No cranial nerve deficit.  disoriented  Skin: Skin is warm and dry. No rash noted.  Psychiatric: Mood and affect normal.  Keep moaning due to pain   LABORATORY PANEL:  Female CBC  Recent Labs Lab 01/07/17 0455  WBC 1.9*  HGB 9.8*  HCT 31.0*  PLT 163   ------------------------------------------------------------------------------------------------------------------ Chemistries   Recent Labs Lab 01/04/17 1339  01/07/17 0455  NA 142  < > 140  K 4.7  < > 5.4*  CL 113*  < > 108  CO2 25  < > 22  GLUCOSE 47*  < > 163*  BUN 44*  < > 43*  CREATININE 1.23*  < > 1.68*  CALCIUM 8.1*  < > 8.4*  AST 15  --   --   ALT 11*  --   --   ALKPHOS 74  --   --   BILITOT 0.7  --   --   < > = values in this interval not displayed. RADIOLOGY:  No results found. ASSESSMENT AND PLAN:  76 year old female with past medical history of renal transplant, peripheral vascular disease status post left below the knee amputation, diabetes, hypertension, history of esophageal candidiasis with recent diagnosis of achalasia, GERD, Ron acute atrial fibrillation, coronary artery disease status post bypass who presents to the hospital due to weakness, tachypnea and suspected to have sepsis secondary to pneumonia and urinary tract infection.  1. Sepsis-Present on admission due to pneumonia along with urinary tract infection.  - Continue IV antibiotics with cefepime. Negative blood and urine cultures. -BP runs  low at baseline in 90s  2. Pneumonia-chest x-ray findings suggestive of possible superimposed pneumonia in the lower lung fields. given Vanco, Cefepime - negative blood cx.    MRSA screen negative. Stopped vanc.  3. UTI - continue IV cefepime, urine cultures growing multiple species.  Can finish course of antibiotics for total of 5 days  4. Altered mental status-metabolic encephalopathy secondary to underlying sepsis and urinary tract infection. CT head is  negative for an acute intrarenal pathology. -Continue treatment as mentioned above and follow mental status.  5. Diabetes type 2 without complication-continue sliding scale insulin, follow blood sugars.  6. Hypothyroidism-continue Synthroid.  7. History of renal transplant-continue prednisone, Prograf, mycophenolate.  8. Right upper extremity DVT-secondary to her PICC line the patient recently had.  Coumadin per pharmacy  9. History of chronic atrial fibrillation-rate controlled. -Hold Coumadin, follow INR to goal of between 2 and 3. INR therapeutic today.  10. Hyperkalemia   Stop oral replacement.    Tried giving kayexalate, vomited.  11. Ac renal insufficiency   Gradual worsening. Monitor.  Regarding patient's dysphagia and esophageal manometry results.  I have discussed this with Dr. Allen Norris who will look into it further    pt's husband, other family members  are agreeable to take her to [facility] with palliative care and transition to Hospice.  If her clinical condition worsens over the weekend, she may end up going to hospice home.  Pt's appears more lethargic and worsening today, so I gave a call to husband and left a message. ALSO  Called and spoke to her daughter,a nd informed about this.  She remains partial code - NO CPR, INTIBATION OR VENTILATION,  BUT OK TO USE MEDS AND BIPAP if needed.  CODE STATUS: Partial Code  TOTAL TIME TAKING CARE OF THIS PATIENT: 35 minutes.   More than 50% of the time was spent in counseling/coordination of care: YES  POSSIBLE D/C IN 2-3 DAYS, DEPENDING ON CLINICAL CONDITION.   Vaughan Basta M.D on 01/08/2017 at 11:43 AM  Between 7am to 6pm - Pager - [phone]  After 6pm go to [email or web address] - Proofreader  [facility]  Office  [phone]  CC: Primary care physician; Donato Heinz, MD  Note: This dictation was prepared with Dragon dictation along with smaller phrase technology. Any  transcriptional errors that result from this process are unintentional.

## 2017-01-08 NOTE — Progress Notes (Addendum)
ANTICOAGULATION CONSULT NOTE - FOLLOW UP  Consult  Pharmacy Consult for warfarin dosing and monitoring  Indication: Atrial fibrillation and Right upper extremity DVT   Allergies  Allergen Reactions  . Azithromycin Other (See Comments)    Upset stomach  . Erythromycin Nausea And Vomiting  . Fentanyl Nausea And Vomiting  . Percocet [Oxycodone-Acetaminophen] Nausea And Vomiting    Patient Measurements: Height: 5\' 3"  (160 cm) Weight: 109 lb (49.4 kg) IBW/kg (Calculated) : 52.4  Vital Signs: Temp: 97 F (36.1 C) (05/20 0403) Temp Source: Oral (05/20 0403) BP: 106/64 (05/20 0403) Pulse Rate: 62 (05/20 0403)  Labs:  Recent Labs  01/06/17 0442 01/07/17 0455  HGB 9.4* 9.8*  HCT 28.6* 31.0*  PLT 170 163  LABPROT 36.8* 29.0*  INR 3.60 2.67  CREATININE 1.31* 1.68*    Estimated Creatinine Clearance: 21.9 mL/min (A) (by C-G formula based on SCr of 1.68 mg/dL (H)).   Medical History: Past Medical History:  Diagnosis Date  . Adenomatous polyp   . Anemia   . 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 Atrial fibrillation ([place])    a. on Coumadin. No rate-controlling medications with baseline bradycardia.   . Bradycardia   . CAD (coronary artery disease)    a. s/p 1-vessel CABG to LCx in 2001  . Carotid artery stenosis    mild  . Carotid bruit    left  . Chronic kidney disease    s/p cadaveric renal transplant in 2001  . Colitis   . Compression fracture of L4 lumbar vertebra ([facility])   . Coronary artery disease   . CVA (cerebral infarction)   . Diabetes mellitus   . Diabetic retinopathy   . Esophageal candidiasis ([place])   . Flu 11/22/2016  . Fracture of right maxilla 09/21/2012  . Fx ankle   . Fx wrist   . Gastritis   . GERD (gastroesophageal reflux disease)   . H/O immunosuppressive therapy   . History of orthostatic hypotension   . Hyperlipidemia   . Hyperparathyroidism   .  Hypothyroidism   . IBS (irritable bowel syndrome)   . Ischemic colitis ([place])   . Lactose intolerance   . Leg ulcer ([place])    from poor fitting prosthetic  . Melanoma ([place])   . Nephropathy   . 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 Neuropathy   . Osteoporosis    recurrent fractures  . Pancreatic cyst   . Pneumonia 11/2016  . Pulmonary embolism ([place])   . PVD (peripheral vascular disease) ([place])   . Renal artery stenosis ([place])   . 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 Renal osteodystrophy   . Right orbit fracture ([place]) 09/21/2012  . Stroke ([place])   . TBI (traumatic brain injury) ([place]) 09/30/2012  . Tubular adenoma   . Zygoma fracture ([place]) 09/21/2012    Assessment: 76 yo female admitted with sepsis with PMH of A. Fib and  Right upper extremity DVT. Pharmacy consulted for warfarin dosing and monitoring.   Home Regimen: Warfarin 2.5mg  daily   DATE  INR     DOSE  5/16  4.75     HELD  5/17                 4.75         HELD  5/18  3.6           1 mg 5/19  2.67 (see note from 5/19)  HELD (see note) 5/20   2.19  Goal of Therapy:  INR 2-3 Monitor platelets by anticoagulation protocol: Yes   Plan:  Will resume warfarin at dosing of 1 mg daily and f/u AM INR. Will give warfarin 1 mg this AM to in an attempt to prevent INR from becoming subtherapeutic in patient with active clot, INR decreasing, and no warfarin dose yesterday.   Napoleon Form, PharmD, BCPS Clinical Pharmacist 01/08/2017 8:28 AM

## 2017-01-08 NOTE — Plan of Care (Signed)
Problem: Physical Regulation: Goal: Will remain free from infection Outcome: Progressing Pt receiving IV antibiotics  Problem: Skin Integrity: Goal: Risk for impaired skin integrity will decrease Outcome: Progressing Pt on Q2 hr turns  Problem: Activity: Goal: Risk for activity intolerance will decrease Outcome: Not Progressing Pt is unable to transfer out of bed  Problem: Nutrition: Goal: Adequate nutrition will be maintained Outcome: Not Progressing Pt unable to tolerate anything by mouth

## 2017-01-08 NOTE — Progress Notes (Signed)
Family Meeting Note  Advance Directive:yes  Today a meeting took place with the daughter.  Patient is unable to participate due XB:JYNWGN capacity altered mental status   The following clinical team members were present during this meeting:MD  The following were discussed:Patient's diagnosis: altered mental status, sepsis, worsening renal func now. , Patient's progosis: < 6 weeks and Goals for treatment: Continue present management  Discussed about worsening condition, and they agreed on partial code, no intubation or vent or CPR, but OK to use Bipap and medications of ACLS.  Additional follow-up to be provided: palliative care.  Time spent during discussion:20 minutes  Taro Hidrogo, Rosalio Macadamia, MD

## 2017-01-08 NOTE — Progress Notes (Signed)
I spoke to pt's husband in room, informed him about the condition and plan. He agrees an satisfied.  Additional time spent 10 min.

## 2017-01-09 DIAGNOSIS — I5033 Acute on chronic diastolic (congestive) heart failure: Secondary | ICD-10-CM

## 2017-01-09 LAB — BASIC METABOLIC PANEL
Anion gap: 3 — ABNORMAL LOW (ref 5–15)
BUN: 39 mg/dL — AB (ref 6–20)
CALCIUM: 8.2 mg/dL — AB (ref 8.9–10.3)
CO2: 23 mmol/L (ref 22–32)
Chloride: 115 mmol/L — ABNORMAL HIGH (ref 101–111)
Creatinine, Ser: 1.43 mg/dL — ABNORMAL HIGH (ref 0.44–1.00)
GFR calc Af Amer: 40 mL/min — ABNORMAL LOW (ref >60)
GFR, EST NON AFRICAN AMERICAN: 34 mL/min — AB (ref >60)
GLUCOSE: 142 mg/dL — AB (ref 65–99)
Potassium: 4.8 mmol/L (ref 3.5–5.1)
Sodium: 141 mmol/L (ref 135–145)

## 2017-01-09 LAB — CULTURE, BLOOD (ROUTINE X 2)
CULTURE: NO GROWTH
CULTURE: NO GROWTH
SPECIAL REQUESTS: ADEQUATE
Special Requests: ADEQUATE

## 2017-01-09 LAB — CBC
HCT: 29.5 % — ABNORMAL LOW (ref 35.0–47.0)
Hemoglobin: 9.4 g/dL — ABNORMAL LOW (ref 12.0–16.0)
MCH: 30.8 pg (ref 26.0–34.0)
MCHC: 32 g/dL (ref 32.0–36.0)
MCV: 96.2 fL (ref 80.0–100.0)
PLATELETS: 159 10*3/uL (ref 150–440)
RBC: 3.06 MIL/uL — ABNORMAL LOW (ref 3.80–5.20)
RDW: 19.2 % — AB (ref 11.5–14.5)
WBC: 1.9 10*3/uL — ABNORMAL LOW (ref 3.6–11.0)

## 2017-01-09 LAB — GLUCOSE, CAPILLARY
GLUCOSE-CAPILLARY: 135 mg/dL — AB (ref 65–99)
Glucose-Capillary: 177 mg/dL — ABNORMAL HIGH (ref 65–99)
Glucose-Capillary: 175 mg/dL — ABNORMAL HIGH (ref 65–99)
Glucose-Capillary: 201 mg/dL — ABNORMAL HIGH (ref 65–99)

## 2017-01-09 LAB — PROTIME-INR
INR: 1.85
PROTHROMBIN TIME: 21.6 s — AB (ref 11.4–15.2)

## 2017-01-09 MED ORDER — WARFARIN SODIUM 5 MG PO TABS: 2.5000 mg | ORAL_TABLET | Freq: Once | ORAL | Status: AC

## 2017-01-09 NOTE — Progress Notes (Signed)
[facility] NAME: Elizabeth [name]    MR#:  [id number]  DATE OF BIRTH:  [birth year]  SUBJECTIVE:   Pt. Here due to Sepsis from Pneumonia, UTI.  Remains lethargic, encephalopathic, Husband at bedside. PO intake remains poor.  Pt. Tearful and upset this a.m. Awaiting Palliative care discussion about possibly considering COMFORT CARE ONLY.   REVIEW OF SYSTEMS:    Review of Systems  Unable to perform ROS: Mental acuity    Nutrition: Pureed with thin liquids.   Tolerating Diet: Very Little Tolerating PT: Bedbound   DRUG ALLERGIES:   Allergies  Allergen Reactions  . Azithromycin Other (See Comments)    Upset stomach  . Erythromycin Nausea And Vomiting  . Fentanyl Nausea And Vomiting  . Percocet [Oxycodone-Acetaminophen] Nausea And Vomiting    VITALS:  Blood pressure 107/84, pulse 62, temperature 98.5 F (36.9 C), temperature source Axillary, resp. rate (!) 23, height 5\' 3"  (1.6 m), weight 53.5 kg (118 lb), SpO2 100 %.  PHYSICAL EXAMINATION:   Physical Exam  GENERAL:  76 y.o.-year-old patient lying in bed lethargic/encephalopathic.  EYES: Pupils equal, round, reactive to light and accommodation. No scleral icterus. Extraocular muscles intact.  HEENT: Head atraumatic, normocephalic. Oropharynx and nasopharynx clear.  NECK:  Supple, no jugular venous distention. No thyroid enlargement, no tenderness.  LUNGS: Normal breath sounds bilaterally, no wheezing, rales, rhonchi. No use of accessory muscles of respiration.  CARDIOVASCULAR: S1, S2 normal. No murmurs, rubs, or gallops.  ABDOMEN: Soft, nontender, nondistended. Bowel sounds present. No organomegaly or mass.  EXTREMITIES: No cyanosis, clubbing or edema b/l.    NEUROLOGIC: Cranial nerves II through XII are intact. No focal Motor or sensory deficits b/l.  Globally weak PSYCHIATRIC: The patient is alert and oriented x 1.  SKIN: No obvious rash, lesion, or ulcer.     LABORATORY PANEL:   CBC  Recent Labs Lab 01/09/17 0639  WBC 1.9*  HGB 9.4*  HCT 29.5*  PLT 159   ------------------------------------------------------------------------------------------------------------------  Chemistries   Recent Labs Lab 01/04/17 1339  01/09/17 0639  NA 142  < > 141  K 4.7  < > 4.8  CL 113*  < > 115*  CO2 25  < > 23  GLUCOSE 47*  < > 142*  BUN 44*  < > 39*  CREATININE 1.23*  < > 1.43*  CALCIUM 8.1*  < > 8.2*  AST 15  --   --   ALT 11*  --   --   ALKPHOS 74  --   --   BILITOT 0.7  --   --   < > = values in this interval not displayed. ------------------------------------------------------------------------------------------------------------------  Cardiac Enzymes  Recent Labs Lab 01/04/17 1339  TROPONINI 0.04*   ------------------------------------------------------------------------------------------------------------------  RADIOLOGY:  Dg Chest 1 View  Result Date: 01/08/2017 CLINICAL DATA:  Hypoxia. EXAM: CHEST 1 VIEW COMPARISON:  One-view chest x-ray 01/04/2017 FINDINGS: Heart is enlarged. Atherosclerotic calcifications are again noted at the aorta. All Right greater than left pleural effusions are increasing. Interstitial edema is worse. Bilateral lower lobe airspace disease is worse on the right. IMPRESSION: 1. Increasing interstitial edema and right greater than left pleural effusions. Findings are compatible with congestive heart failure. 2. Right greater than left lower lobe airspace disease is also progressed. This remains concerning for right lower lobe pneumonia. Left lower lobe pneumonia or atelectasis is present. Electronically Signed   By: Elizabeth [name] M.D.   On: 01/08/2017 13:38  ASSESSMENT AND PLAN:   76 year old female with past medical history of renal transplant, peripheral vascular disease status post left below the knee amputation, diabetes, hypertension, history of esophageal candidiasis with recent  diagnosis of achalasia, GERD,  chronic atrial fibrillation, coronary artery disease status post bypass who presents to the hospital due to weakness, tachypnea and suspected to have sepsis secondary to pneumonia and urinary tract infection.  1. Sepsis-Present on admission due to pneumonia along with urinary tract infection.  - Continue IV antibiotics with cefepime. Negative blood and urine cultures. -BP runs low at baseline in 90s  2. Pneumonia-chest x-ray findings suggestive of possible superimposed pneumonia in the lower lung fields.  - currently on Cefepime. Off Vanc. As MRSA PCR (-).   3. UTI - continue IV cefepime, urine cultures growing multiple species.   - total of 5 days of treatment.   4. Altered mental status-metabolic encephalopathy secondary to underlying sepsis and urinary tract infection. CT head is negative for an acute intrarenal pathology. - not much improvement in mental status with treatment as mentioned above.    5. Diabetes type 2 without complication-continue sliding scale insulin - BS Stable.    6. Hypothyroidism-continue Synthroid.  7. History of renal transplant-continue prednisone, Prograf, mycophenolate.  8. Right upper extremity DVT-secondary to her PICC line the patient recently had.   - cont. Coumadin as per pharmacy.   9. History of chronic atrial fibrillation-rate controlled. - cont. Coumadin.   10. Hyperkalemia - resolved.   Patient's overall prognosis is very poor given her multiple comorbidities. Palliative care following. Patient remains limited code. Awaiting patient's son to arrive from [place] and to discuss with him and family members as they are considering possible hospice care. I had an extensive discussion with the patient's husband about her poor prognosis and will continue current care until further discussions of Palliative care with patient's family  All the records are reviewed and case discussed with Care  Management/Social Worker. Management plans discussed with the patient, family and they are in agreement.  CODE STATUS: Limited code  DVT Prophylaxis: Coumadin  TOTAL TIME TAKING CARE OF THIS PATIENT: 40 minutes.   POSSIBLE D/C IN 2-3 DAYS, DEPENDING ON CLINICAL CONDITION.  Greater than 50% of time spent in Coordinating care with family and discussing with pt's Husband.    Henreitta Leber M.D on 01/09/2017 at 1:43 PM  Between 7am to 6pm - Pager - [phone]  After 6pm go to [email or web address] - Proofreader  [facility]  Office  [phone]  CC: Primary care physician; Donato Heinz, MD

## 2017-01-09 NOTE — Clinical Social Work Note (Signed)
CSW is continuing to follow for discharge planning needs. Palliative Care is following for goals of care and disposition. Pt's family is waiting to pt's son to travel in from [place] to assist in decision making. Dispo is pending. Pt is able to return to [facility] should the family decide to continue rehab. CSW will continue to follow.   Darden Dates, MSW, LCSW  Clinical Social Worker  [phone]

## 2017-01-09 NOTE — Care Management Important Message (Signed)
Important Message  Patient Details  Name: Elizabeth [name] MRN: [id number] Date of Birth: 08/20/1941   Medicare Important Message Given:  Yes    Shelbie Ammons, RN 01/09/2017, 8:21 AM

## 2017-01-09 NOTE — Progress Notes (Signed)
ANTICOAGULATION CONSULT NOTE - FOLLOW UP  Consult  Pharmacy Consult for warfarin dosing and monitoring  Indication: Atrial fibrillation and Right upper extremity DVT   Allergies  Allergen Reactions  . Azithromycin Other (See Comments)    Upset stomach  . Erythromycin Nausea And Vomiting  . Fentanyl Nausea And Vomiting  . Percocet [Oxycodone-Acetaminophen] Nausea And Vomiting    Patient Measurements: Height: 5\' 3"  (160 cm) Weight: 118 lb (53.5 kg) IBW/kg (Calculated) : 52.4  Vital Signs: Temp: 97.5 F (36.4 C) (05/21 1351) Temp Source: Oral (05/21 1351) BP: 107/84 (05/21 0509) Pulse Rate: 54 (05/21 1351)  Labs:  Recent Labs  01/07/17 0455 01/08/17 0750 01/09/17 0639  HGB 9.8*  --  9.4*  HCT 31.0*  --  29.5*  PLT 163  --  159  LABPROT 29.0* 24.7* 21.6*  INR 2.67 2.19 1.85  CREATININE 1.68*  --  1.43*    Estimated Creatinine Clearance: 27.3 mL/min (A) (by C-G formula based on SCr of 1.43 mg/dL (H)).   Assessment: 76 yo female admitted with sepsis with PMH of A. Fib and diagnosed with a right upper extremity DVT. Pharmacy consulted for warfarin dosing and monitoring.   Home Regimen: Warfarin 2.5mg  daily   INR = 4.75 was elevated on admission, however patient had been prescribed SMX/TMP for a UTI prior to admission. INR likely elevated 2/2 drug interaction.  DATE  INR     DOSE  5/16  4.75     HELD  5/17                 4.75         HELD  5/18                 3.6           1 mg 5/19  2.67 (see note from 5/19)  HELD (see note) 5/20   2.19     1 mg ordered (pt unable to take) 5/21  1.85     2.5 mg  Goal of Therapy:  INR 2-3 Monitor platelets by anticoagulation protocol: Yes   Plan:  Warfarin dose ordered yesterday was not given as patient was unable to take medication per [facility] notes. Patient has taken some PO medications today. INR is now subtherapeutic. Discussed need for bridge therapy with active clot. No bridge at this time per MD.  Will resume home dose  and give warfarin 2.5 mg PO this evening and recheck INR with AM labs tomorrow.  Lenis Noon, PharmD, BCPS Clinical Pharmacist 01/09/2017 2:20 PM

## 2017-01-09 NOTE — Progress Notes (Signed)
I had an extensive discussion with the patient's husband about the patient's prognosis and goals of care.  Patient's husband had multiple questions about the duration of treatment of the patient's urinary tract infection, the fact that the patient's urine cultures did not grow anything although the results from the skilled nursing facility were positive for Klebsiella.  Patient also wanted to find out whether she truly had achalasia as she has had manometry testing done but the results are still pending. I have reached out to gastroenterology and spoken with Dr. Vicente Males who will try to get the results of the manometry and speak to the husband tomorrow about it.  Patient continues to be a limited code for now palliative care is following the patient.  Family meeting with palliative care tomorrow to further discuss goals of care whether patient is to be discharged home with home health versus home with hospice services.  Face to face contact with family in discussing all of this.   Time Spent: 20 min.

## 2017-01-09 NOTE — Progress Notes (Signed)
Nutrition Follow-up  DOCUMENTATION CODES:   Severe malnutrition in context of chronic illness  INTERVENTION:  Continue Ensure Enlive po BID between meals, each supplement provides 350 kcal and 20 grams of protein.   Continue daily multivitamin with minerals. May benefit from liquid form pending outcome of goals of care discussions.  Will continue to monitor discussions regarding goals of care.   NUTRITION DIAGNOSIS:   Malnutrition (Severe) related to chronic illness (dysphagia, possible achalasia) as evidenced by percent weight loss, severe depletion of body fat, severe depletion of muscle mass, energy intake < or equal to 75% for > or equal to 1 month.  Ongoing.  GOAL:   Patient will meet greater than or equal to 90% of their needs  Not met.  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  -PMT following patient/family. Have discussed hospice but husband would like to speak to son from [place].  -Still pending esophageal manometry results.  Discussed with RN. Patient did not have anything to eat yesterday and did not not have any Ensure - unable to take PO. Today she had some medications in applesauce but had an episode of emesis afterwards. Later had some bites of oatmeal and sips of water that she kept down. Family may choose comfort care.  Medications reviewed and include: ferrous sulfate 325  mg daily (unable to take today), Novolog sliding scale TID with meals and QHS, levothyroxine (unable to take today), Remeron, MVI daily (unable to take), prednisone 5 mg every morning, tacrolimus 2 mg every morning and 1 mg daily at bedtime, warfarin, NS @ 75 ml/hr, cefepime.  Labs reviewed: CBG 131-146, Chloride 115, BUN 39, Creatinine 1.43.   Diet Order:  DIET - DYS 1 Room service appropriate? Yes with Assist; Fluid consistency: Thin  Skin:  Wound (see comment) (skin tear to right arm)  Last BM:  01/07/2017 - type 5 per chart  Height:   Ht Readings from Last 1 Encounters:  01/04/17 5' 3" (1.6 m)    Weight:   Wt Readings from Last 1 Encounters:  01/09/17 118 lb (53.5 kg)    Ideal Body Weight:  50.2 kg  BMI:  Body mass index is 20.9 kg/m.  Estimated Nutritional Needs:   Kcal:  1370-1580 (MSJ x 1.3-1.5)  Protein:  72-84 grams (1.2-1.4 grams/kg)  Fluid:  1.4-1.6 L/day  EDUCATION NEEDS:   No education needs identified at this time  Willey Blade, MS, RD, LDN Pager: [phone] After Hours Pager: [phone]

## 2017-01-09 NOTE — Progress Notes (Signed)
Daily Progress Note   Patient Name: Elizabeth [name]       Date: 01/09/2017 DOB: [birth year]  Age: 76 y.o. MRN#: [id number] Attending Physician: Henreitta Leber, MD Primary Care Physician: Donato Heinz, MD Admit Date: 01/04/2017  Reason for Consultation/Follow-up: Establishing goals of care  Subjective: Patient wakes to voice. Alert and pleasantly confused. Denies pain or discomfort. Ate little breakfast. Husband speaks of her having difficulty swallowing pills in applesauce this morning.   Husband, Elizabeth [name], and I discussed plan of care in family waiting room. He tells me of her "rough weekend" and how they called the family in yesterday. He becomes very tearful during the conversation stating "there's nothing else I can do to help."  Answered many questions regarding hospice services and EOL expectations. Discussed a transition to comfort measures where focus is comfort, quality, and dignity--focus on symptom management and preventing re-hospitalization at the end of her life. Elizabeth [name] and the family are leaning towards hospice services at home but Elizabeth [name] is waiting for son to arrive from [place].  Provided emotional support.   Length of Stay: 5  Current Medications: Scheduled Meds:  . atorvastatin  10 mg Oral QODAY  . cholecalciferol  2,000 Units Oral Daily  . escitalopram  10 mg Oral Daily  . feeding supplement (ENSURE ENLIVE)  237 mL Oral BID BM  . ferrous sulfate  325 mg Oral Daily  . insulin aspart  0-5 Units Subcutaneous QHS  . insulin aspart  0-9 Units Subcutaneous TID WC  . levothyroxine  112 mcg Oral QAC breakfast  . mirtazapine  7.5 mg Oral QHS  . multivitamin with minerals  1 tablet Oral Daily  . mycophenolate  360 mg Oral BID  . predniSONE  5 mg Oral q morning - 10a    . sodium polystyrene  30 g Oral Once  . tacrolimus  1 mg Oral QHS  . tacrolimus  2 mg Oral q morning - 10a  . warfarin  1 mg Oral q1800  . warfarin  1 mg Oral Once  . Warfarin - Pharmacist Dosing Inpatient   Does not apply q1800    Continuous Infusions: . sodium chloride 75 mL/hr at 01/08/17 2045  . ceFEPime (MAXIPIME) IV Stopped (01/08/17 1042)    PRN Meds: acetaminophen **OR** acetaminophen, docusate sodium, HYDROcodone-acetaminophen, morphine injection, ondansetron **  OR** ondansetron (ZOFRAN) IV, promethazine  Physical Exam  Constitutional: She appears cachectic. She is cooperative. She appears ill.  HENT:  Head: Normocephalic and atraumatic.  Cardiovascular: Regular rhythm.   Murmur heard. Pulmonary/Chest: Effort normal. No accessory muscle usage. No tachypnea. No respiratory distress. She has decreased breath sounds.  Abdominal: Normal appearance.  Neurological: She is alert.  Skin: Skin is warm and dry. There is pallor.  Psychiatric: Her speech is delayed. Cognition and memory are impaired. She is inattentive.  Nursing note and vitals reviewed.          Vital Signs: BP 107/84 (BP Location: Right Arm)   Pulse 62   Temp 98.5 F (36.9 C) (Axillary)   Resp (!) 23   Ht 5\' 3"  (1.6 m)   Wt 53.5 kg (118 lb)   SpO2 100%   BMI 20.90 kg/m  SpO2: SpO2: 100 % O2 Device: O2 Device: Nasal Cannula O2 Flow Rate: O2 Flow Rate (L/min): 2 L/min  Intake/output summary:   Intake/Output Summary (Last 24 hours) at 01/09/17 1000 Last data filed at 01/09/17 0300  Gross per 24 hour  Intake             1425 ml  Output                0 ml  Net             1425 ml   LBM: Last BM Date: 01/07/17 Baseline Weight: Weight: 51.7 kg (114 lb) Most recent weight: Weight: 53.5 kg (118 lb)       Palliative Assessment/Data: PPS 30%   Flowsheet Rows     Most Recent Value  Intake Tab  Referral Department  Hospitalist  Unit at Time of Referral  Cardiac/Telemetry Unit  Palliative Care  Primary Diagnosis  Other (Comment)  Date Notified  01/04/17  Palliative Care Type  New Palliative care  Reason for referral  Clarify Goals of Care  Date of Admission  01/04/17  Date first seen by Palliative Care  01/05/17  # of days Palliative referral response time  1 Day(s)  # of days IP prior to Palliative referral  0  Clinical Assessment  Palliative Performance Scale Score  30%  Psychosocial & Spiritual Assessment  Palliative Care Outcomes  Patient/Family meeting held?  Yes  Who was at the meeting?  husband  Palliative Care Outcomes  Clarified goals of care, Counseled regarding hospice, Provided end of life care assistance, ACP counseling assistance, Provided psychosocial or spiritual support      Patient Active Problem List   Diagnosis Date Noted  . Pneumonia, unspecified organism   . Acute on chronic diastolic heart failure ([place])   . Palliative care encounter   . Goals of care, counseling/discussion   . Sepsis ([place]) 01/04/2017  . Atrial fibrillation with rapid ventricular response ([place])   . AKI (acute kidney injury) ([place]) 12/07/2016  . Kidney transplant recipient   . Herpes simplex   . Failure to thrive (child)   . Hx of BKA, left ([place])   . Acute on chronic respiratory failure ([place]) 11/30/2016  . Hypoxia   . CHF (congestive heart failure) ([place])   . S/P thoracentesis   . Influenza with pneumonia   . CKD (chronic kidney disease) stage 4, GFR 15-29 ml/min (HCC)   . Chronic anticoagulation   . Community acquired pneumonia of right lower lobe of lung ([place])   . Febrile illness 11/22/2016  . Pleural effusion, right 09/30/2016  . Acquired hypothyroidism 09/01/2014  .  Hyperlipidemia 06/18/2014  . Altered mental status 11/19/2013  . Acute encephalopathy 11/19/2013  . Chronic diastolic heart failure ([place]) 10/21/2013  . History of renal transplant 10/21/2013  . Encounter for therapeutic drug monitoring 09/17/2013  . Chronic cystitis 05/13/2013  . UTI (lower urinary tract  infection) 09/27/2012  . Concussion, unspecified 09/21/2012  . 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 Orthostatic hypotension 06/06/2012  . 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 Constipation 10/11/2011  . Nocturnal polyuria 03/31/2011  . Paroxysmal atrial fibrillation ([place]) 01/28/2011  . Encounter for anticoagulation discussion and counseling 01/28/2011  . CVA (cerebral vascular accident) ([place]) 01/28/2011  . DVT of upper extremity (deep vein thrombosis) ([place]) 01/28/2011  . Edema 01/10/2011  . SINUS BRADYCARDIA 12/14/2009  . GERD 12/08/2009  . DIZZINESS 05/20/2009  . Type 2 diabetes mellitus with stage 4 chronic kidney disease, without long-term current use of insulin ([place]) 02/11/2009  . HTN (hypertension), benign 02/09/2009  . Coronary artery disease involving native coronary artery of native heart without angina pectoris 02/09/2009  . CAROTID BRUITS, BILATERAL 02/09/2009  . 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 Irritable bowel syndrome 08/18/2008  . COLONIC POLYPS, HX OF 08/18/2008    Palliative Care Assessment & Plan   Patient Profile: 76 y.o. female  with past medical history of multiple CVAs, DM, renal transplant on immunosuppressive therapy, recurrent UTIs, CAD s/p CABG, PVD s/p LBKA, Afib, and diastolic CHF, chronic pleural effusion s/p multiple thoracentesis, and orthostatic hypotension  who was admitted on 01/04/2017 with weakness and tachypnea.  Initial work up reveals likely CHF exacerbation with possible PNA and UTI.  This is her 4th hospitalization in the last 6 weeks. Of note the patient had an echo cardiogram at [facility] in February 2018.  Her LVEF was 55%, she had moderate mitral stenosis and mitral regurg, her right ventricular pressure was high (approx 55%).  Assessment: Sepsis Pneumonia UTI Metabolic encephalopathy Hx of renal transplant Hx of multiple CVA's Chronic atrial  fibrillation Dysphagia  Recommendations/Plan:  Partial code-NO resuscitation or life support but YES to non-invasive ventilation and ACLS medications.  Husband waiting on son from [place] to further discuss hospice tonight.   PMT will f/u in AM. Likely home with hospice services. Husband understands hospice services will not escalate care with BiPAP or ACLS medications making her a DNR on discharge.    Code Status: Partial code-no resuscitation/intubation. Use BiPAP/meds   Code Status Orders        Start     Ordered   01/05/17 1435  Limited resuscitation (code)  Continuous    Question Answer Comment  In the event of cardiac or respiratory ARREST: Initiate Code Blue, Call Rapid Response Yes   In the event of cardiac or respiratory ARREST: Perform CPR No   In the event of cardiac or respiratory ARREST: Perform Intubation/Mechanical Ventilation No   In the event of cardiac or respiratory ARREST: Use NIPPV/BiPAp only if indicated Yes   In the event of cardiac or respiratory ARREST: Administer ACLS medications if indicated Yes   In the event of cardiac or respiratory ARREST: Perform Defibrillation or Cardioversion if indicated No      01/05/17 1435    Code Status History    Date Active Date Inactive Code Status Order ID Comments User Context   01/04/2017  9:21 PM 01/05/2017  2:35 PM DNR [id number]  Herma Carson, RN Inpatient   01/04/2017  7:45 PM 01/04/2017  9:21 PM Full Code [id number]  Henreitta Leber, MD Inpatient   12/07/2016  9:03 PM 12/16/2016  9:25 PM Full Code [id number]  Hillary Bow, MD ED   11/22/2016  1:20 PM 12/02/2016 10:17 PM DNR [id number]  Zada Finders, MD ED   11/19/2013  3:15 PM 11/23/2013  8:52 PM Full Code [id number]  Timmothy Euler, MD Inpatient   01/27/2012 10:48 PM 02/01/2012  5:42 PM Partial Code [id number]  Valda Lamb, RN Inpatient    Advance Directive Documentation     Most Recent Value  Type of Advance Directive  Healthcare Power of Attorney, Living  will  Pre-existing out of facility DNR order (yellow form or pink MOST form)  -  "MOST" Form in Place?  -       Prognosis:   Unable to determine  Discharge Planning:  To Be Determined likely home with hospice  Care plan was discussed with patient, husband, RN, Dr. Verdell Carmine, and RN CM  Thank you for allowing the Palliative Medicine Team to assist in the care of this patient.   Time In: 0920 Time Out: 1000 Total Time 19min Prolonged Time Billed no       Greater than 50%  of this time was spent counseling and coordinating care related to the above assessment and plan.  Ihor Dow, FNP-C Palliative Medicine Team  Phone: [phone] Fax: [phone]  Please contact Palliative Medicine Team phone at [phone] for questions and concerns.

## 2017-01-09 NOTE — Plan of Care (Signed)
Problem: Physical Regulation: Goal: Will remain free from infection Outcome: Progressing Pt receiving IV antibiotics  Problem: Skin Integrity: Goal: Risk for impaired skin integrity will decrease Outcome: Progressing Pt on Q2 hr turns  Problem: Activity: Goal: Risk for activity intolerance will decrease Outcome: Not Progressing Pt is bed bound.  Problem: Nutrition: Goal: Adequate nutrition will be maintained Outcome: Not Progressing Pt unable to tolerate much of anything by mouth. Pt was able to keep a few bites of oatmeal and a few sips of water down this morning. Patient vomited this morning after meds in applesauce was given.

## 2017-01-09 NOTE — Telephone Encounter (Signed)
Please see if the report is ready to be reviewed at [facility]. It was done on 12/26/16. Pt is calling for results.

## 2017-01-10 DIAGNOSIS — Z7189 Other specified counseling: Secondary | ICD-10-CM

## 2017-01-10 LAB — BASIC METABOLIC PANEL
Anion gap: 12 (ref 5–15)
BUN: 44 mg/dL — AB (ref 6–20)
CHLORIDE: 114 mmol/L — AB (ref 101–111)
CO2: 16 mmol/L — ABNORMAL LOW (ref 22–32)
CREATININE: 1.58 mg/dL — AB (ref 0.44–1.00)
Calcium: 8.3 mg/dL — ABNORMAL LOW (ref 8.9–10.3)
GFR calc non Af Amer: 30 mL/min — ABNORMAL LOW (ref >60)
GFR, EST AFRICAN AMERICAN: 35 mL/min — AB (ref >60)
Glucose, Bld: 233 mg/dL — ABNORMAL HIGH (ref 65–99)
Potassium: 5.3 mmol/L — ABNORMAL HIGH (ref 3.5–5.1)
SODIUM: 142 mmol/L (ref 135–145)

## 2017-01-10 LAB — CBC
HCT: 29 % — ABNORMAL LOW (ref 35.0–47.0)
HEMOGLOBIN: 9 g/dL — AB (ref 12.0–16.0)
MCH: 30.3 pg (ref 26.0–34.0)
MCHC: 31 g/dL — AB (ref 32.0–36.0)
MCV: 97.6 fL (ref 80.0–100.0)
Platelets: 177 10*3/uL (ref 150–440)
RBC: 2.97 MIL/uL — ABNORMAL LOW (ref 3.80–5.20)
RDW: 20 % — AB (ref 11.5–14.5)
WBC: 2.4 10*3/uL — ABNORMAL LOW (ref 3.6–11.0)

## 2017-01-10 LAB — PROTIME-INR
INR: 1.85
PROTHROMBIN TIME: 21.6 s — AB (ref 11.4–15.2)

## 2017-01-10 LAB — GLUCOSE, CAPILLARY: GLUCOSE-CAPILLARY: 207 mg/dL — AB (ref 65–99)

## 2017-01-10 MED ORDER — FUROSEMIDE 10 MG/ML IJ SOLN
40.0000 mg | Freq: Once | INTRAMUSCULAR | Status: DC
  Filled 2017-01-10: qty 4

## 2017-01-10 MED ORDER — MORPHINE SULFATE (CONCENTRATE) 10 MG /0.5 ML PO SOLN: 10.0000 mg | ORAL | 0 refills | Status: AC | PRN

## 2017-01-10 NOTE — Progress Notes (Signed)
Daily Progress Note   Patient Name: Elizabeth [name]       Date: 01/10/2017 DOB: May 02, 1941  Age: 76 y.o. MRN#: [id number] Attending Physician: Henreitta Leber, MD Primary Care Physician: Donato Heinz, MD Admit Date: 01/04/2017  Reason for Consultation/Follow-up: Establishing goals of care  Subjective: Patient is barely responsive.    I met with [name], Nira Conn and the patient's son who flew in from [place] yesterday.   Elizabeth [name] has declined over the past week.  She is beginning to accumulate fluid in Elizabeth arms and legs.  She is less responsive and eating less.  [name] has spoken with Dr. Marval Regal this morning.  He is beginning to understand she is near end of life.  We discussed Hospice services, Hospice philosophy and a plan of action.  I answered many questions regarding feeding, comfort care, symptom management, re-hospitalization, and doctors visits.   [name]'s outstanding question is whether or not his [name] can continue Elizabeth renal transplant ant-irejection medications - myfortic and prograf. (They have a 1 - 2 month supply left)  I respond that they could use up their existing supply, but it is unlikely that Hospice would pay for these medications going forward. I also feel that their existing supply is likely enough.  I don't feel that she has more than 4-8 weeks left.    Assessment: More lethargic, starting to become more edematous in Elizabeth arms and legs.  Not eating.  Initially this was due to difficulty swallowing and vomiting - now she appears to have decreased desire to eat.   Patient Profile/HPI: 76 y.o. female  with past medical history of multiple CVAs, DM, renal transplant on immunosuppressive therapy, recurrent UTIs, CAD s/p CABG, PVD s/p LBKA, Afib, and diastolic CHF,  chronic pleural effusion s/p multiple thoracentesis, and orthostatic hypotension  who was admitted on 01/04/2017 with weakness and tachypnea.  Initial work up reveals likely CHF exacerbation with possible PNA and UTI.  This is Elizabeth 4th hospitalization in the last 6 weeks.  She is having great difficulty with swallowing and recurrent vomiting.  Length of Stay: 6  Current Medications: Scheduled Meds:  . escitalopram  10 mg Oral Daily  . feeding supplement (ENSURE ENLIVE)  237 mL Oral BID BM  . levothyroxine  112 mcg Oral QAC breakfast  . mirtazapine  7.5  mg Oral QHS  . mycophenolate  360 mg Oral BID  . predniSONE  5 mg Oral q morning - 10a  . sodium polystyrene  30 g Oral Once  . tacrolimus  1 mg Oral QHS  . tacrolimus  2 mg Oral q morning - 10a  . warfarin  2.5 mg Oral ONCE-1800  . Warfarin - Pharmacist Dosing Inpatient   Does not apply q1800    Continuous Infusions: . ceFEPime (MAXIPIME) IV Stopped (01/09/17 1033)    PRN Meds: acetaminophen **OR** acetaminophen, docusate sodium, HYDROcodone-acetaminophen, morphine injection, ondansetron **OR** ondansetron (ZOFRAN) IV, promethazine  Physical Exam       Well developed, elderly, chronically ill appearing female Opens Elizabeth eyes and attempts to speak briefly but falls back to sleep. CV irreg rhythm Resp decreased  Abdomen soft non tender Extremities 2+ edema.  Rt LE BKA  Vital Signs: BP (!) 133/56 (BP Location: Right Arm)   Pulse 64   Temp 97.7 F (36.5 C) (Axillary)   Resp (!) 23   Ht 5' 3"  (1.6 m)   Wt 52.6 kg (116 lb)   SpO2 96%   BMI 20.55 kg/m  SpO2: SpO2: 96 % O2 Device: O2 Device: Nasal Cannula O2 Flow Rate: O2 Flow Rate (L/min): 2 L/min  Intake/output summary:   Intake/Output Summary (Last 24 hours) at 01/10/17 1109 Last data filed at 01/10/17 0156  Gross per 24 hour  Intake           1498.5 ml  Output                0 ml  Net           1498.5 ml   LBM: Last BM Date: 01/08/17 Baseline Weight: Weight: 51.7 kg  (114 lb) Most recent weight: Weight: 52.6 kg (116 lb)       Palliative Assessment/Data:    Flowsheet Rows     Most Recent Value  Intake Tab  Referral Department  Hospitalist  Unit at Time of Referral  Cardiac/Telemetry Unit  Palliative Care Primary Diagnosis  Other (Comment)  Date Notified  01/04/17  Palliative Care Type  New Palliative care  Reason for referral  Clarify Goals of Care  Date of Admission  01/04/17  Date first seen by Palliative Care  01/05/17  # of days Palliative referral response time  1 Day(s)  # of days IP prior to Palliative referral  0  Clinical Assessment  Palliative Performance Scale Score  20%  Psychosocial & Spiritual Assessment  Palliative Care Outcomes  Patient/Family meeting held?  Yes  Who was at the meeting?  husband  Palliative Care Outcomes  Clarified goals of care, Counseled regarding hospice, Provided end of life care assistance, ACP counseling assistance, Provided psychosocial or spiritual support      Patient Active Problem List   Diagnosis Date Noted  . Encounter for hospice care discussion   . Pneumonia, unspecified organism   . Acute on chronic diastolic heart failure ([place])   . Palliative care encounter   . Goals of care, counseling/discussion   . Sepsis ([place]) 01/04/2017  . Atrial fibrillation with rapid ventricular response ([place])   . AKI (acute kidney injury) ([place]) 12/07/2016  . Kidney transplant recipient   . Herpes simplex   . Failure to thrive (child)   . Hx of BKA, left ([place])   . Acute on chronic respiratory failure ([place]) 11/30/2016  . Hypoxia   . CHF (congestive heart failure) ([place])   . S/P thoracentesis   .  Influenza with pneumonia   . CKD (chronic kidney disease) stage 4, GFR 15-29 ml/min (HCC)   . Chronic anticoagulation   . Community acquired pneumonia of right lower lobe of lung ([place])   . Febrile illness 11/22/2016  . Pleural effusion, right 09/30/2016  . Acquired hypothyroidism 09/01/2014  . Hyperlipidemia  06/18/2014  . Altered mental status 11/19/2013  . Acute encephalopathy 11/19/2013  . Chronic diastolic heart failure ([place]) 10/21/2013  . History of renal transplant 10/21/2013  . Encounter for therapeutic drug monitoring 09/17/2013  . Chronic cystitis 05/13/2013  . UTI (lower urinary tract infection) 09/27/2012  . Concussion, unspecified 09/21/2012  . 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 Orthostatic hypotension 06/06/2012  . 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 Constipation 10/11/2011  . Nocturnal polyuria 03/31/2011  . Paroxysmal atrial fibrillation ([place]) 01/28/2011  . Encounter for anticoagulation discussion and counseling 01/28/2011  . CVA (cerebral vascular accident) ([place]) 01/28/2011  . DVT of upper extremity (deep vein thrombosis) ([place]) 01/28/2011  . Edema 01/10/2011  . SINUS BRADYCARDIA 12/14/2009  . GERD 12/08/2009  . DIZZINESS 05/20/2009  . Type 2 diabetes mellitus with stage 4 chronic kidney disease, without long-term current use of insulin ([place]) 02/11/2009  . HTN (hypertension), benign 02/09/2009  . Coronary artery disease involving native coronary artery of native heart without angina pectoris 02/09/2009  . CAROTID BRUITS, BILATERAL 02/09/2009  . 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 Irritable bowel syndrome 08/18/2008  . COLONIC POLYPS, HX OF 08/18/2008    Palliative Care Plan    Recommendations/Plan:  Home with Hospice Services  D/C medications and interventions not related to comfort (statin, SSI-insulin, vitamins, iron, etc)  DNR out of hospital  Goals of Care and Additional Recommendations:  Limitations on Scope of Treatment: Full Comfort Care  Code Status:  DNR  Prognosis:  Estimated 3-6 weeks at best based on encephalopathy, rapid decline, inability to take life sustaining POs, immobility, advanced heart disease, advanced CKD,   Discharge Planning:  Home with  Hospice  Care plan was discussed with family, PCP,  and case manager  Thank you for allowing the Palliative Medicine Team to assist in the care of this patient.  Total time spent:  60 min.     Greater than 50%  of this time was spent counseling and coordinating care related to the above assessment and plan.  Imogene Burn, PA-C Palliative Medicine  Please contact Palliative MedicineTeam phone at [phone] for questions and concerns between 7 am - 7 pm.   Please see AMION for individual provider pager numbers.

## 2017-01-10 NOTE — Discharge Summary (Signed)
[place] [facility] NAME: Elizabeth [name]    MR#:  [id number]  DATE OF BIRTH:  July 06, 1941  DATE OF ADMISSION:  01/04/2017 ADMITTING PHYSICIAN: Henreitta Leber, MD  DATE OF DISCHARGE: 01/10/2017  PRIMARY CARE PHYSICIAN: Donato Heinz, MD    ADMISSION DIAGNOSIS:  Swelling [R60.9] Sepsis, due to unspecified organism ([place]) [A41.9] Pneumonia, unspecified organism [J18.9]  DISCHARGE DIAGNOSIS:  Active Problems:   Sepsis ([place])   Acute on chronic diastolic heart failure [facility])   Palliative care encounter   Goals of care, counseling/discussion   Pneumonia, unspecified organism   Encounter for hospice care discussion   SECONDARY DIAGNOSIS:   Past Medical History:  Diagnosis Date  . Adenomatous polyp   . Anemia   . 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 Atrial fibrillation ([place])    a. on Coumadin. No rate-controlling medications with baseline bradycardia.   . Bradycardia   . CAD (coronary artery disease)    a. s/p 1-vessel CABG to LCx in 2001  . Carotid artery stenosis    mild  . Carotid bruit    left  . Chronic kidney disease    s/p cadaveric renal transplant in 2001  . Colitis   . Compression fracture of L4 lumbar vertebra (HCC)   . Coronary artery disease   . CVA (cerebral infarction)   . Diabetes mellitus   . Diabetic retinopathy   . Esophageal candidiasis ([place])   . Flu 11/22/2016  . Fracture of right maxilla 09/21/2012  . Fx ankle   . Fx wrist   . Gastritis   . GERD (gastroesophageal reflux disease)   . H/O immunosuppressive therapy   . History of orthostatic hypotension   . Hyperlipidemia   . Hyperparathyroidism   . Hypothyroidism   . IBS (irritable bowel syndrome)   . Ischemic colitis ([place])   . Lactose intolerance   . Leg ulcer ([place])    from poor fitting prosthetic  . Melanoma ([place])   . Nephropathy   . Neurogenic  pain-esophagus 09/04/2012  . Neuropathy   . Osteoporosis    recurrent fractures  . Pancreatic cyst   . Pneumonia 11/2016  . Pulmonary embolism ([place])   . PVD (peripheral vascular disease) ([place])   . Renal artery stenosis ([place])   . 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 Renal osteodystrophy   . Right orbit fracture ([place]) 09/21/2012  . Stroke ([place])   . TBI (traumatic brain injury) ([place]) 09/30/2012  . Tubular adenoma   . Zygoma fracture ([place]) 09/21/2012    HOSPITAL COURSE:   76 year old female with past medical history of renal transplant, peripheral vascular disease status post left below the knee amputation, diabetes, hypertension, history of esophageal candidiasis with recent diagnosis of achalasia, GERD,  chronic atrial fibrillation, coronary artery disease status post bypass who presents to the hospital due to weakness, tachypnea and suspected to have sepsis secondary to pneumonia and urinary tract infection.  1. Sepsis-Present on admission due to pneumonia along with urinary tract infection.  -Patient was treated with broad-spectrum IV antibiotics with vancomycin, cefepime. Vancomycin was discontinued due to MRSA PCR being negative. She was treated with IV cefepime for both the UTI and pneumonia.  2. Pneumonia-chest x-ray findings suggestive of possible superimposed pneumonia in the lower lung fields.  - treated with IV Cefepime while in the hospital.  3. UTI - treated with IV Cefepime. Urine cultures here in hospital were mixed culture and consistent with contamination.  - at the SNF it was positive for Klebsiella.   4. Altered mental status-metabolic encephalopathy secondary to underlying sepsis and urinary tract infection. CT head is negative for any acute intracrainal pathology. - mental status not much improvement despite getting treatment as mentioned above.   5. Diabetes type 2 without complication 6. Hypothyroidism 7. History of renal transplant 8. Right  upper extremity DVT-secondary to her PICC line the patient recently had.   9. History of chronic atrial fibrillation  Despite getting the aggressive care as mentioned above for her sepsis, pneumonia/UTI patient's clinical condition was not improving. She remained altered, Oral intake was significantly poor. Palliative care consult is obtained to discuss goals of care with family. Patient after further discussions with family and due to her poor prognosis given her multiple comorbidities and not much improvement in her clinical status was made DO NOT RESUSCITATE, and now being discharged home with hospice services. Family is in agreement with this plan.  DISCHARGE CONDITIONS:   Guarded  CONSULTS OBTAINED:    DRUG ALLERGIES:   Allergies  Allergen Reactions  . Azithromycin Other (See Comments)    Upset stomach  . Erythromycin Nausea And Vomiting  . Fentanyl Nausea And Vomiting  . Percocet [Oxycodone-Acetaminophen] Nausea And Vomiting    DISCHARGE MEDICATIONS:   Allergies as of 01/10/2017      Reactions   Azithromycin Other (See Comments)   Upset stomach   Erythromycin Nausea And Vomiting   Fentanyl Nausea And Vomiting   Percocet [oxycodone-acetaminophen] Nausea And Vomiting      Medication List    STOP taking these medications   atorvastatin 10 MG tablet Commonly known as:  LIPITOR   cholecalciferol 1000 units tablet Commonly known as:  VITAMIN D   docusate sodium 100 MG capsule Commonly known as:  COLACE   enoxaparin 60 MG/0.6ML injection Commonly known as:  LOVENOX   HYDROcodone-acetaminophen 5-325 MG tablet Commonly known as:  NORCO/VICODIN   hyoscyamine 0.125 MG Tbdp disintergrating tablet Commonly known as:  ANASPAZ   insulin detemir 100 UNIT/ML injection Commonly known as:  LEVEMIR   insulin lispro 100 UNIT/ML injection Commonly known as:  HUMALOG   levothyroxine 112 MCG tablet Commonly known as:  SYNTHROID, LEVOTHROID   loperamide 2 MG  capsule Commonly known as:  IMODIUM   magnesium gluconate 500 MG tablet Commonly known as:  MAGONATE   metoprolol tartrate 25 MG tablet Commonly known as:  LOPRESSOR   mirtazapine 7.5 MG tablet Commonly known as:  REMERON   multivitamin with minerals Tabs tablet   mycophenolate 360 MG Tbec EC tablet Commonly known as:  MYFORTIC   nitroGLYCERIN 0.4 MG SL tablet Commonly known as:  NITROSTAT   omeprazole 40 MG capsule Commonly known as:  PRILOSEC   ondansetron 4 MG tablet Commonly known as:  ZOFRAN   ONETOUCH VERIO test strip Generic drug:  glucose blood   potassium chloride 10 MEQ tablet Commonly known as:  K-DUR,KLOR-CON   predniSONE 5 MG tablet Commonly known as:  DELTASONE   promethazine 25 MG tablet Commonly known as:  PHENERGAN   SM IRON 325 (65 FE) MG tablet Generic drug:  ferrous sulfate   tacrolimus 0.5 MG capsule Commonly known as:  PROGRAF   torsemide 20 MG tablet Commonly known as:  DEMADEX   warfarin 3 MG tablet Commonly known as:  COUMADIN     TAKE these  medications   acetaminophen 500 MG tablet Commonly known as:  TYLENOL Take 1,000 mg by mouth every 6 (six) hours as needed for moderate pain or headache.   feeding supplement (GLUCERNA SHAKE) Liqd Take 237 mLs by mouth 3 (three) times daily between meals.   LEXAPRO 10 MG tablet Generic drug:  escitalopram Take 10 mg by mouth daily.   morphine CONCENTRATE 10 mg / 0.5 ml concentrated solution Take 0.5 mLs (10 mg total) by mouth every 2 (two) hours as needed for severe pain.         DISCHARGE INSTRUCTIONS:   DIET:  Pureed diet for pleasure  DISCHARGE CONDITION:  Serious  ACTIVITY:  Activity as tolerated  OXYGEN:  Home Oxygen: Yes.     Oxygen Delivery: 2 liters/min via Patient connected to nasal cannula oxygen  DISCHARGE LOCATION:  Home with Hospice   If you experience worsening of your admission symptoms, develop shortness of breath, life threatening emergency,  suicidal or homicidal thoughts you must seek medical attention immediately by calling 911 or calling your MD immediately  if symptoms less severe.  You Must read complete instructions/literature along with all the possible adverse reactions/side effects for all the Medicines you take and that have been prescribed to you. Take any new Medicines after you have completely understood and accpet all the possible adverse reactions/side effects.   Please note  You were cared for by a hospitalist during your hospital stay. If you have any questions about your discharge medications or the care you received while you were in the hospital after you are discharged, you can call the unit and asked to speak with the hospitalist on call if the hospitalist that took care of you is not available. Once you are discharged, your primary care physician will handle any further medical issues. Please note that NO REFILLS for any discharge medications will be authorized once you are discharged, as it is imperative that you return to your primary care physician (or establish a relationship with a primary care physician if you do not have one) for your aftercare needs so that they can reassess your need for medications and monitor your lab values.     Today   Remains Lethargic, encephalopathic.  Does not follow commands.  No other acute events overnight.   VITAL SIGNS:  Blood pressure (!) 133/56, pulse 64, temperature 97.7 F (36.5 C), temperature source Axillary, resp. rate (!) 23, height 5\' 3"  (1.6 m), weight 52.6 kg (116 lb), SpO2 96 %.  I/O:    Intake/Output Summary (Last 24 hours) at 01/10/17 1446 Last data filed at 01/10/17 0156  Gross per 24 hour  Intake           1498.5 ml  Output                0 ml  Net           1498.5 ml    PHYSICAL EXAMINATION:   GENERAL:  76 y.o.-year-old patient lying in bed lethargic/encephalopathic.  EYES: Pupils equal, round, reactive to light. No scleral icterus.  Extraocular muscles intact.  HEENT: Head atraumatic, normocephalic. Oropharynx and nasopharynx clear.  Dry Oral Mucosa.  NECK:  Supple, no jugular venous distention. No thyroid enlargement, no tenderness.  LUNGS: Normal breath sounds bilaterally, no wheezing, rales, rhonchi. No use of accessory muscles of respiration.  CARDIOVASCULAR: S1, S2 normal. No murmurs, rubs, or gallops.  ABDOMEN: Soft, nontender, nondistended. Bowel sounds present. No organomegaly or mass.  EXTREMITIES: No cyanosis,  clubbing or edema b/l.    NEUROLOGIC: Cranial nerves II through XII are intact. No focal Motor or sensory deficits b/l.  Globally weak PSYCHIATRIC: The patient is alert and oriented x 1.  SKIN: No obvious rash, lesion, or ulcer.   DATA REVIEW:   CBC  Recent Labs Lab 01/10/17 0423  WBC 2.4*  HGB 9.0*  HCT 29.0*  PLT 177    Chemistries   Recent Labs Lab 01/04/17 1339  01/10/17 0423  NA 142  < > 142  K 4.7  < > 5.3*  CL 113*  < > 114*  CO2 25  < > 16*  GLUCOSE 47*  < > 233*  BUN 44*  < > 44*  CREATININE 1.23*  < > 1.58*  CALCIUM 8.1*  < > 8.3*  AST 15  --   --   ALT 11*  --   --   ALKPHOS 74  --   --   BILITOT 0.7  --   --   < > = values in this interval not displayed.  Cardiac Enzymes  Recent Labs Lab 01/04/17 1339  TROPONINI 0.04*    Microbiology Results  Results for orders placed or performed during the hospital encounter of 01/04/17  Culture, blood (routine x 2)     Status: None   Collection Time: 01/04/17  1:39 PM  Result Value Ref Range Status   Specimen Description BLOOD LEFT HAND  Final   Special Requests   Final    BOTTLES DRAWN AEROBIC AND ANAEROBIC Blood Culture adequate volume   Culture NO GROWTH 5 DAYS  Final   Report Status 01/09/2017 FINAL  Final  Culture, blood (routine x 2)     Status: None   Collection Time: 01/04/17  1:39 PM  Result Value Ref Range Status   Specimen Description BLOOD LEFT ARM  Final   Special Requests   Final    BOTTLES DRAWN  AEROBIC AND ANAEROBIC Blood Culture adequate volume   Culture NO GROWTH 5 DAYS  Final   Report Status 01/09/2017 FINAL  Final  Urine culture     Status: Abnormal   Collection Time: 01/04/17  3:16 PM  Result Value Ref Range Status   Specimen Description URINE, CATHETERIZED  Final   Special Requests NONE  Final   Culture MULTIPLE SPECIES PRESENT, SUGGEST RECOLLECTION (A)  Final   Report Status 01/05/2017 FINAL  Final  MRSA PCR Screening     Status: None   Collection Time: 01/04/17 10:09 PM  Result Value Ref Range Status   MRSA by PCR NEGATIVE NEGATIVE Final    Comment:        The GeneXpert MRSA Assay (FDA approved for NASAL specimens only), is one component of a comprehensive MRSA colonization surveillance program. It is not intended to diagnose MRSA infection nor to guide or monitor treatment for MRSA infections.     RADIOLOGY:  No results found.    Management plans discussed with the patient, family and they are in agreement.  CODE STATUS:     Code Status Orders        Start     Ordered   01/10/17 1206  Do not attempt resuscitation (DNR)  Continuous    Question Answer Comment  In the event of cardiac or respiratory ARREST Do not call a "code blue"   In the event of cardiac or respiratory ARREST Do not perform Intubation, CPR, defibrillation or ACLS   In the event of cardiac or respiratory ARREST  Use medication by any route, position, wound care, and other measures to relive pain and suffering. May use oxygen, suction and manual treatment of airway obstruction as needed for comfort.      01/10/17 1205    TOTAL TIME TAKING CARE OF THIS PATIENT: 40 minutes.    Henreitta Leber M.D on 01/10/2017 at 2:46 PM  Between 7am to 6pm - Pager - [phone]  After 6pm go to [email or web address] - Proofreader  [facility]  Office  [phone]  CC: Primary care physician; Donato Heinz, MD

## 2017-01-10 NOTE — Progress Notes (Addendum)
Writer spoke again with patient's husband [name] after he and his children returned from touring the Hospice home. He has decided to take his wife home with hospice services. [name] has chosen for Dr. Marco Collie to be his wife's attending doctor for hopsice.   No DME needs at this time as patient has oxygen and a hospital bed in place in the home. Patient information and choice of Dr. Nyra Capes as attending,  faxed to referral. Hospital care team updated to husband's choice. Thank you. Flo Shanks RN, BSN, [place] [facility] [place], [facility] [phone] c

## 2017-01-10 NOTE — Care Management Important Message (Signed)
Important Message  Patient Details  Name: Elizabeth [name] MRN: [id number] Date of Birth: 1941/08/14   Medicare Important Message Given:  Yes    Shelbie Ammons, RN 01/10/2017, 7:06 AM

## 2017-01-10 NOTE — Progress Notes (Signed)
Pt going home with hospice. Education provided about comfort medications and how to give.  IV was removed. Patient responds only to pain and doesn't open eyes.  Patient refused to have foley put in. After meeting with palliative medicine, she is now a DNR and care will focus on comfort.   Patient going home via EMS.

## 2017-01-10 NOTE — Clinical Social Work Note (Signed)
Pt's family has chosen to return home with hospice services. RNCM will follow for discharge planning needs. CSW will update facility. CSW is signing off as no further needs identified.   Darden Dates, MSW, LCSW  Clinical Social Worker  [phone]

## 2017-01-10 NOTE — Progress Notes (Addendum)
New referral for [facility] services received from [facility] following a Palliative Medicine follow up meeting. Patient is a 76 year old woman with past medical history of multiple CVAs, DM, renal transplant on immunosuppressive therapy, recurrent UTIs, CAD s/p CABG, PVD s/p Left BKA, Afib, and diastolic CHF, chronic pleural effusion s/p multiple thoracentesis, and orthostatic hypotension  who was admitted on 01/04/2017 with weakness and tachypnea.  Initial work up revealed likely CHF exacerbation with possible PNA and UTI.  This is her 4th hospitalization in the last 6 weeks. Palliative Medicine was consulted for goals of care and have been involved since admission.  Writer met in the family room with patient's husband [name], daughter Nira Conn and son Aaron Edelman to initiate education regarding hospice services at home. After some discussion, the family expressed the desire to "look at the hospice home". They wish to see it and may chose to have patient discharge there instead of home with hospice services. Writer made arrangements for family to meet with hospice home staff. Will await family decision. CMRN Hassan Rowan made aware. Patient information faxed to referral.  Flo Shanks RN, BSN, [place] [facility] [place], [facility] [phone] c

## 2017-01-10 NOTE — Care Management (Signed)
Spoke with Mr. Moret and children at the bedside. Discussed Hospice agencies. [place]. Flo Shanks RN representative for [facility] updated.  Home oxygen per [facility]. Hospital bed in the home. Transportation per [employer].  Dr. Heron Sabins updated Shelbie Ammons RN MSN CCM Care Management [phone]

## 2017-01-10 NOTE — Progress Notes (Signed)
Family have been trying obtain esophageal manometry report for a few days. Explained that there has been a delay, apologized, explained we have data only on a few swallows , explained I could see poor esophageal contractions and progression and Dr Allen Norris will be helping with the interpretation . The data on the computer had not been processed for some reason ?incomplete study and I will contact them back once I have the report    Dr Jonathon Bellows  Gastroenterology/Hepatology Pager: [phone]

## 2017-01-18 ENCOUNTER — Telehealth: Payer: Self-pay

## 2017-01-18 NOTE — Telephone Encounter (Signed)
LVM for patient with results per Dr. Vicente Males confirming achalasia from manometry results.

## 2017-01-20 DEATH — deceased

## 2017-01-31 ENCOUNTER — Ambulatory Visit: Payer: Medicare Other | Admitting: Cardiovascular Disease

## 2018-05-09 IMAGING — DX DG CHEST 2V
2 series · 2 of 2 positions shown · non-contrast
Comparison: 11/22/2016.

CLINICAL DATA: Right pleural effusion. Shortness of breath. Chest
pain .

EXAM:
CHEST  2 VIEW

[chest ap]
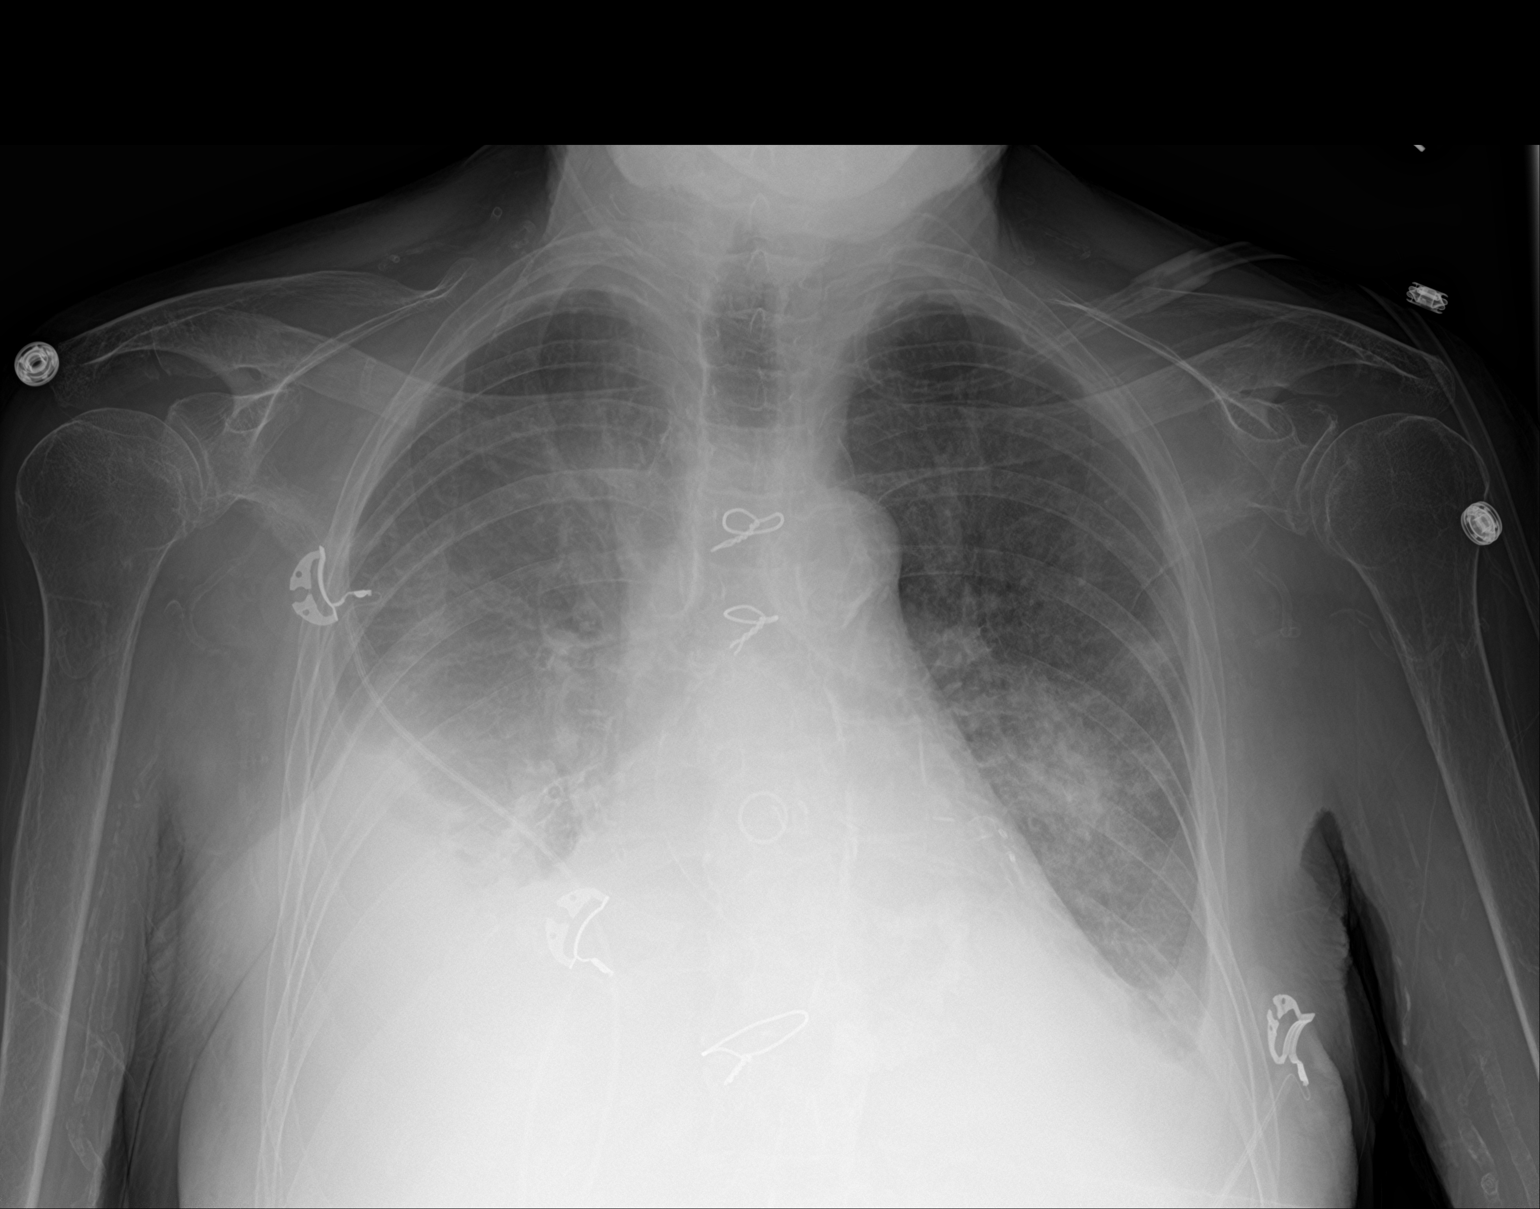

[chest lat]
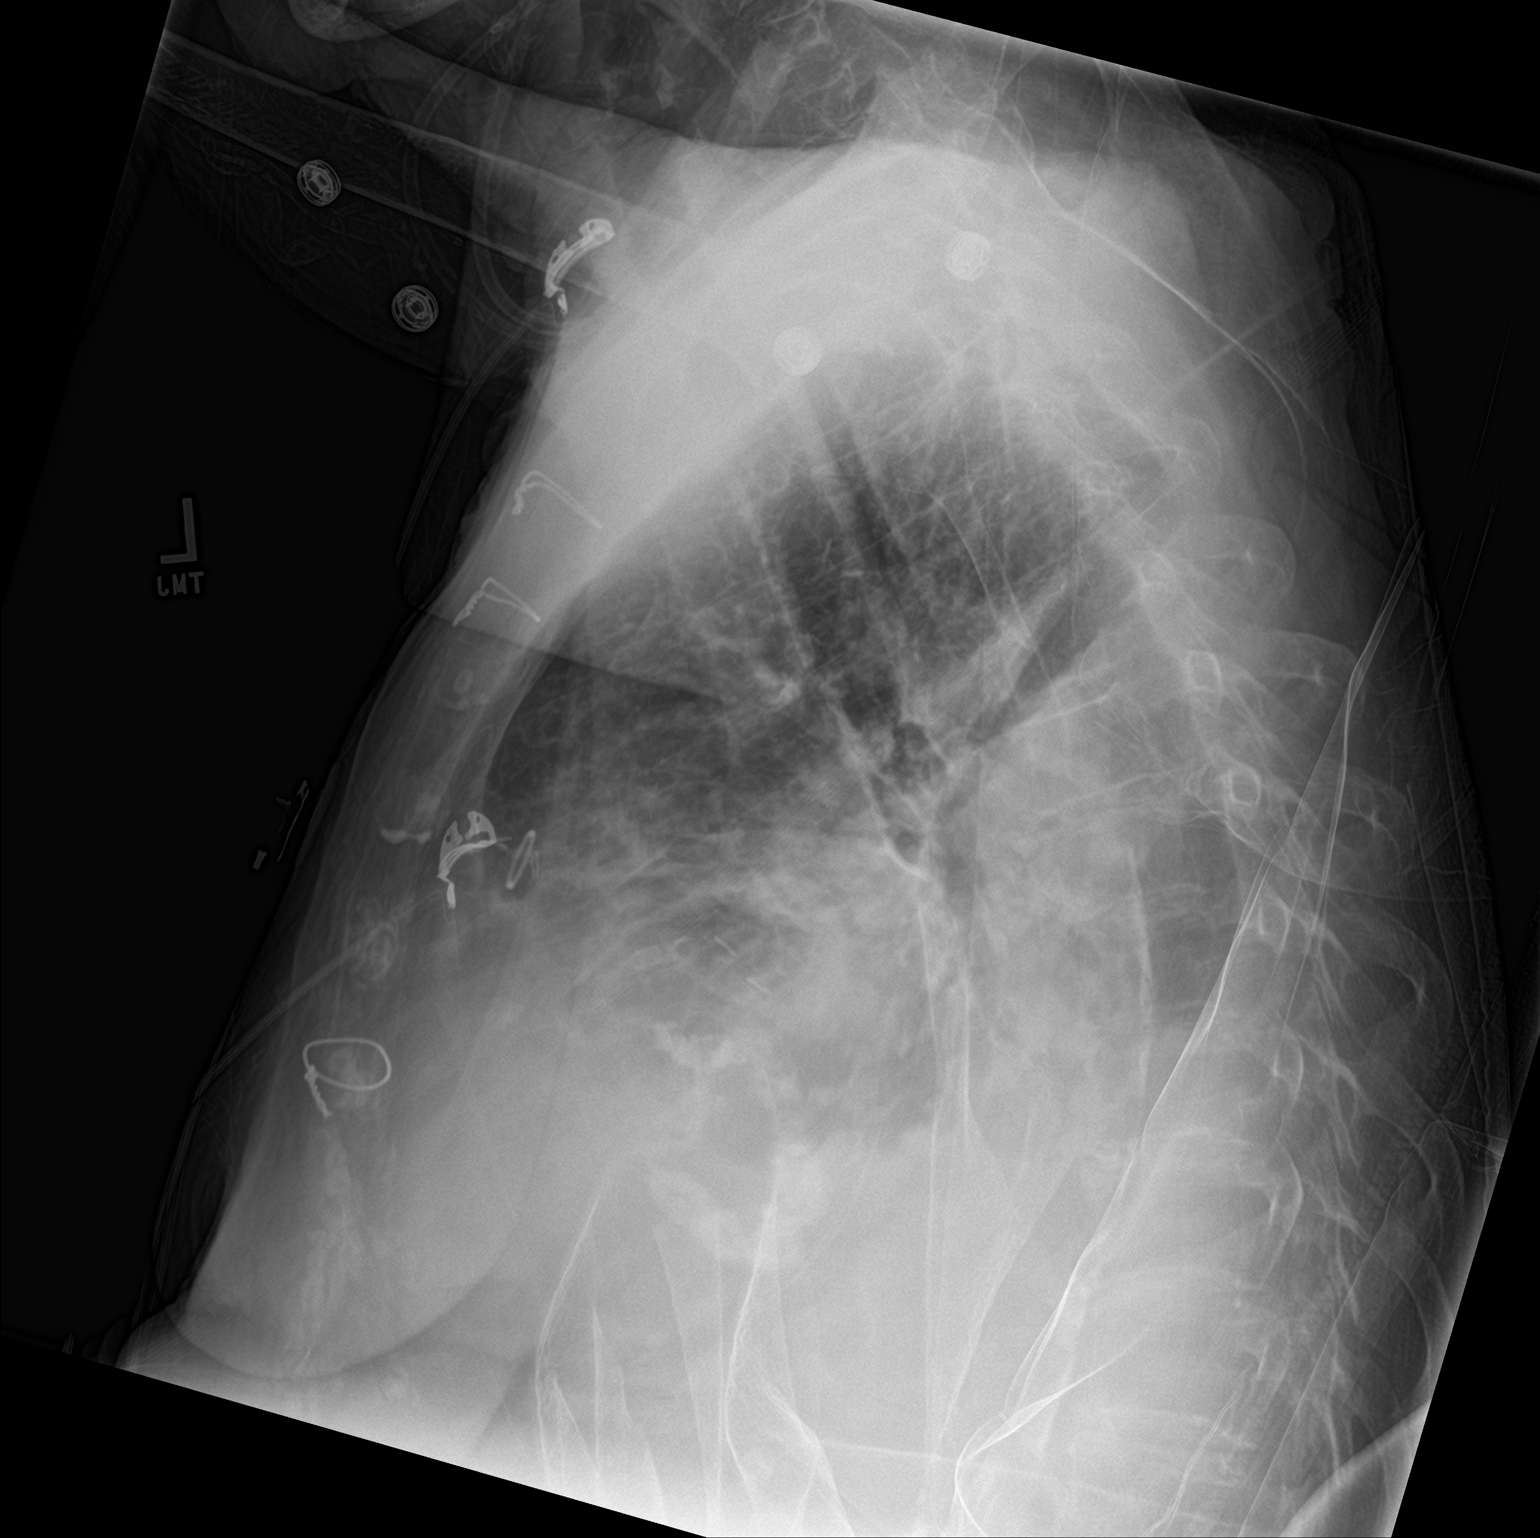

[2 of 2 positions shown; findings below may reference images not displayed]

FINDINGS: Prior CABG. Cardiomegaly. Bilateral pulmonary infiltrates and
pleural effusions consistent with CHF. Bilateral pneumonia cannot be
excluded. Findings have progressed from prior exam. No pneumothorax.
IMPRESSION: Prior CABG. Bilateral pulmonary infiltrates and bilateral pleural
effusions consistent with CHF. Findings have progressed from prior
exam .

## 2018-05-22 IMAGING — DX DG CHEST 1V PORT
1 series · 1 of 1 positions shown · non-contrast
Comparison: Chest radiograph performed 11/29/2016

CLINICAL DATA: Acute onset of vomiting. Subacute onset of loose
stools. Initial encounter.

EXAM:
PORTABLE CHEST 1 VIEW

[chest ap]
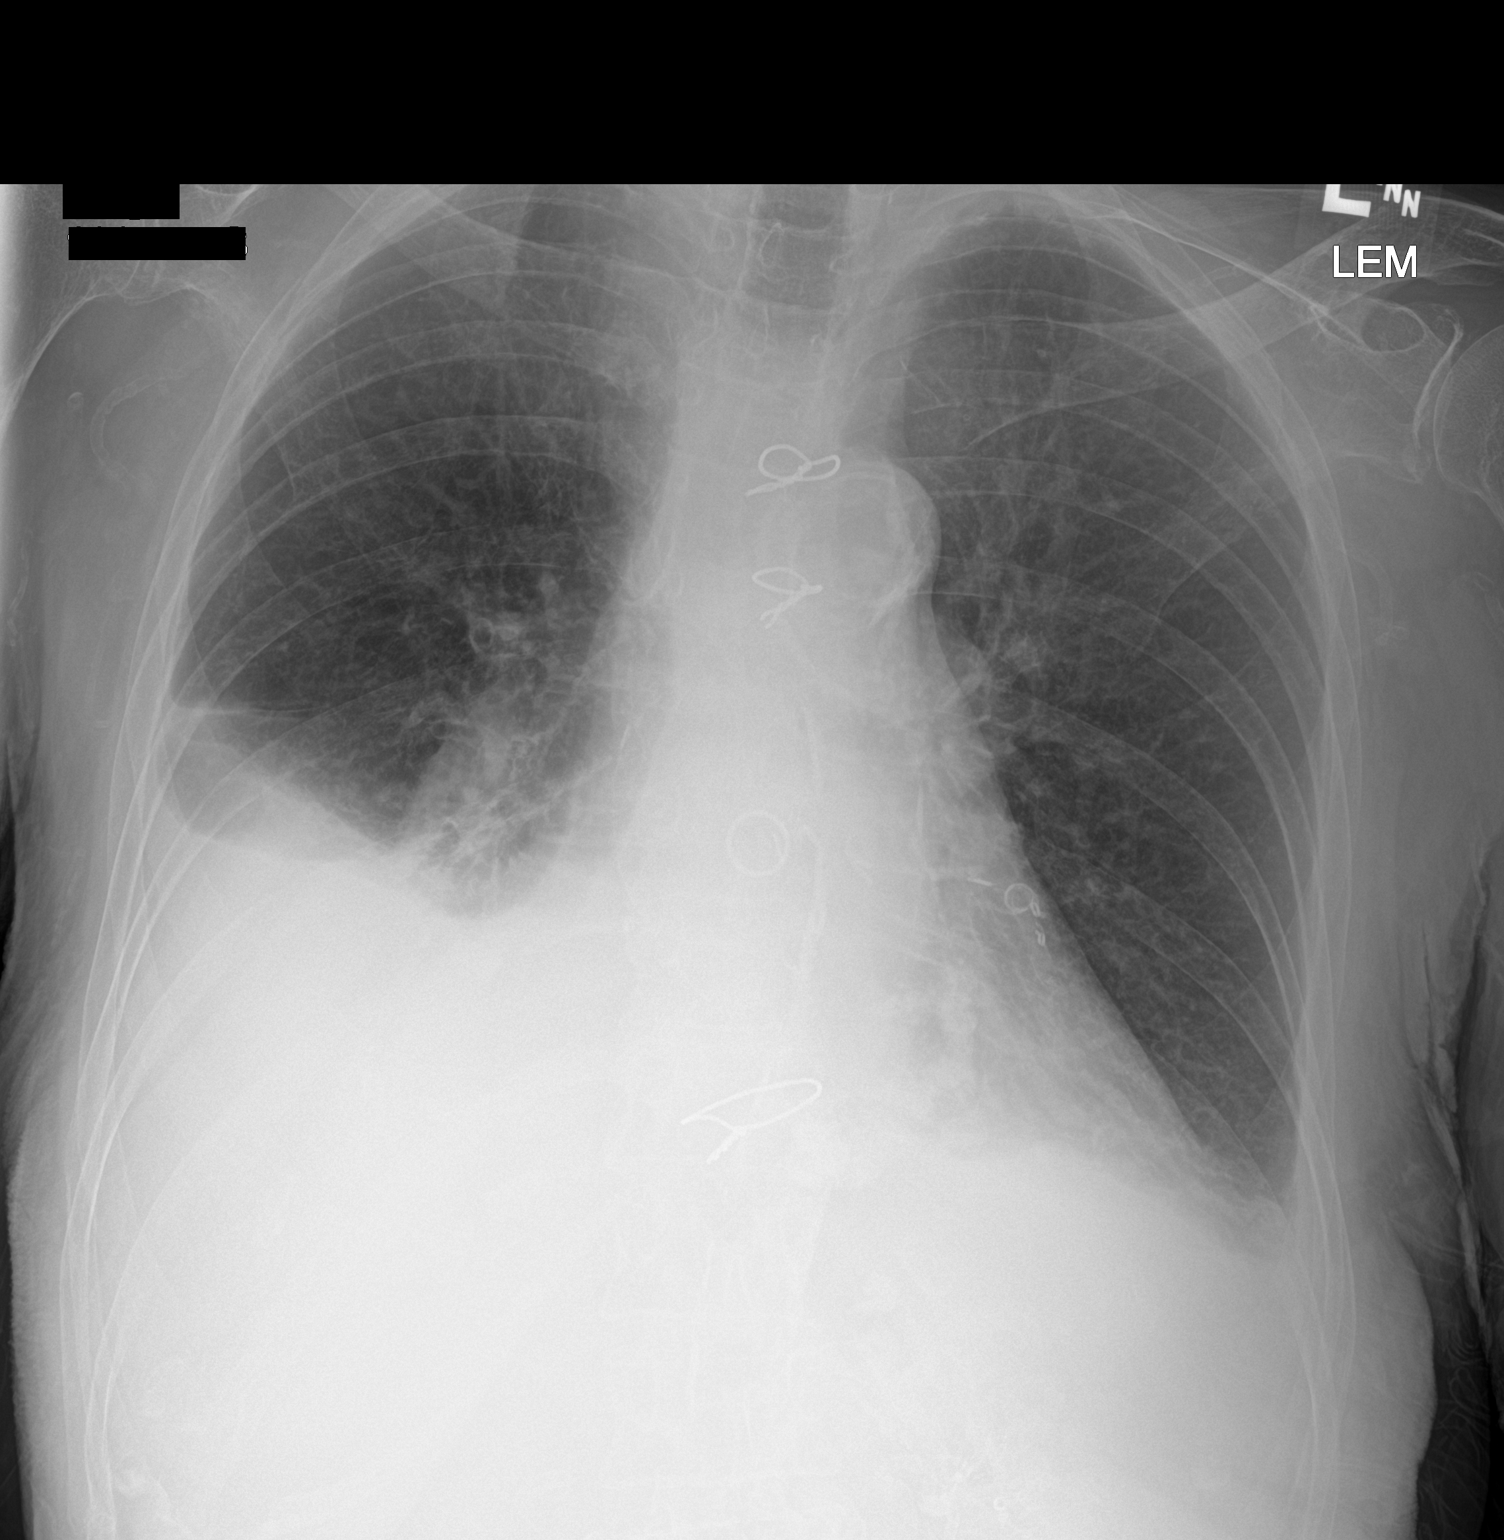

[1 of 1 positions shown; findings below may reference images not displayed]

FINDINGS: Moderate right and small left pleural effusions are seen, with
associated atelectasis. Mild vascular congestion is noted. No
pneumothorax is identified.

The cardiomediastinal silhouette is mildly enlarged. Calcification
is noted at the heart. The patient is status post median sternotomy.
No acute osseous abnormalities are identified.
IMPRESSION: Moderate right and small left pleural effusions, with associated
atelectasis. Mild vascular congestion and mild cardiomegaly.
